# Patient Record
Sex: Female | Born: 1970
Health system: Southern US, Community
[De-identification: ages and names within clinical notes are randomized; demographics above are authoritative.]

## PROBLEM LIST (undated history)

## (undated) DIAGNOSIS — Z9889 Other specified postprocedural states: Secondary | ICD-10-CM

## (undated) DIAGNOSIS — C189 Malignant neoplasm of colon, unspecified: Secondary | ICD-10-CM

## (undated) DIAGNOSIS — R112 Nausea with vomiting, unspecified: Secondary | ICD-10-CM

## (undated) DIAGNOSIS — C187 Malignant neoplasm of sigmoid colon: Secondary | ICD-10-CM

## (undated) DIAGNOSIS — Z87898 Personal history of other specified conditions: Secondary | ICD-10-CM

## (undated) DIAGNOSIS — K6289 Other specified diseases of anus and rectum: Secondary | ICD-10-CM

## (undated) DIAGNOSIS — Z9221 Personal history of antineoplastic chemotherapy: Secondary | ICD-10-CM

## (undated) DIAGNOSIS — C772 Secondary and unspecified malignant neoplasm of intra-abdominal lymph nodes: Principal | ICD-10-CM

## (undated) HISTORY — DX: Malignant neoplasm of sigmoid colon: C18.7

## (undated) HISTORY — DX: Malignant neoplasm of colon, unspecified: C18.9

## (undated) HISTORY — PX: TONSILLECTOMY: SUR1361

## (undated) HISTORY — PX: OTHER SURGICAL HISTORY: SHX169

## (undated) HISTORY — PX: OSTEOTOMY: SHX137

## (undated) HISTORY — DX: Secondary and unspecified malignant neoplasm of intra-abdominal lymph nodes: C77.2

---

## 2009-12-23 ENCOUNTER — Ambulatory Visit: Payer: Self-pay | Admitting: Family Medicine

## 2009-12-23 DIAGNOSIS — J069 Acute upper respiratory infection, unspecified: Secondary | ICD-10-CM | POA: Insufficient documentation

## 2009-12-23 DIAGNOSIS — J029 Acute pharyngitis, unspecified: Secondary | ICD-10-CM | POA: Insufficient documentation

## 2010-04-24 NOTE — Assessment & Plan Note (Signed)
Summary: R ear pain, sorethroat x 1 dy rm 5   Vital Signs:  Patient Profile:   40 Years Old Female CC:      R ear pain, sorethroat x 1 dy Height:     65 inches Weight:      167 pounds O2 Sat:      100 % O2 treatment:    Room Air Temp:     97.6 degrees F oral Pulse rate:   72 / minute Pulse rhythm:   regular Resp:     16 per minute BP sitting:   127 / 80  (left arm) Cuff size:   regular  Vitals Entered By: Areta Haber CMA (December 23, 2009 4:56 PM)                  Current Allergies (reviewed today): ! PENICILLIN ! DAYPRO ! SULFA     History of Present Illness Chief Complaint: R ear pain, sorethroat x 1 dy History of Present Illness:  Subjective: Patient complains of sore throat that started last night.  Her daughter has a documented strep infection. No cough No pleuritic pain No wheezing + mild nasal congestion + post-nasal drainage No sinus pain/pressure No itchy/red eyes ? right earache No hemoptysis No SOB No fever/chills No nausea No vomiting No abdominal pain No diarrhea No skin rashes + fatigue + myalgias No headache Used OTC meds without relief   Current Problems: VIRAL URI (ICD-465.9) ACUTE PHARYNGITIS (ICD-462)   Current Meds FLONASE 50 MCG/ACT SUSP (FLUTICASONE PROPIONATE) as directed ALLEGRA ALLERGY 180 MG TABS (FEXOFENADINE HCL) as directed AZITHROMYCIN 250 MG TABS (AZITHROMYCIN) Two tabs by mouth on day 1, then 1 tab daily on days 2 through 5  REVIEW OF SYSTEMS Constitutional Symptoms      Denies fever, chills, night sweats, weight loss, weight gain, and fatigue.  Eyes       Denies change in vision, eye pain, eye discharge, glasses, contact lenses, and eye surgery. Ear/Nose/Throat/Mouth       Complains of ear pain and sore throat.      Denies hearing loss/aids, change in hearing, ear discharge, dizziness, frequent runny nose, frequent nose bleeds, sinus problems, hoarseness, and tooth pain or bleeding.      Comments: R x 1  dy Respiratory       Denies dry cough, productive cough, wheezing, shortness of breath, asthma, bronchitis, and emphysema/COPD.  Cardiovascular       Denies murmurs, chest pain, and tires easily with exhertion.    Gastrointestinal       Denies stomach pain, nausea/vomiting, diarrhea, constipation, blood in bowel movements, and indigestion. Genitourniary       Denies painful urination, kidney stones, and loss of urinary control. Neurological       Denies paralysis, seizures, and fainting/blackouts. Musculoskeletal       Denies muscle pain, joint pain, joint stiffness, decreased range of motion, redness, swelling, muscle weakness, and gout.  Skin       Denies bruising, unusual mles/lumps or sores, and hair/skin or nail changes.  Psych       Denies mood changes, temper/anger issues, anxiety/stress, speech problems, depression, and sleep problems.  Past History:  Past Medical History: Unremarkable  Past Surgical History: Caesarean section Ankle surgery neuropathy - part of nerve removed  Family History: Family History High cholesterol Family History Hypertension  Social History: Married Never Smoked Alcohol use-yes 3-4 weekly Drug use-no Regular exercise-yes Does Patient Exercise:  yes Smoking Status:  never Drug Use:  no   Objective:  Appearance:  Patient appears healthy, stated age, and in no acute distress  Eyes:  Pupils are equal, round, and reactive to light and accomdation.  Extraocular movement is intact.  Conjunctivae are not inflamed.  Ears:  Canals normal.  Tympanic membranes normal.   Nose:  Normal septum.  Normal turbinates, mildly congested.   No sinus tenderness present. Pharynx:  Mildly erythematous Neck:  Supple.  Slightly tender shotty anterior/posterior nodes are palpated bilaterally.  Lungs:  Clear to auscultation.  Breath sounds are equal.  Heart:  Regular rate and rhythm without murmurs, rubs, or gallops.  Abdomen:  Nontender without masses or  hepatosplenomegaly.  Bowel sounds are present.  No CVA or flank tenderness.  Rapid strep test negative  Assessment New Problems: VIRAL URI (ICD-465.9) ACUTE PHARYNGITIS (ICD-462)  SUSPECT VIRAL URI PLUS STREP PHARYNGITIS DESPITE NEGATIVE STREP TEST  Plan New Medications/Changes: AZITHROMYCIN 250 MG TABS (AZITHROMYCIN) Two tabs by mouth on day 1, then 1 tab daily on days 2 through 5  #6 tabs x 0, 12/23/2009, Donna Christen MD  New Orders: Rapid Strep [16109] New Patient Level III [99203] Planning Comments:   Treat empirically for strep with Z-pack.  Add expectorant/decongestant, cough suppressant at night, Ibuprofen for sore throat. Follow-up with PCP if not improving.   The patient and/or caregiver has been counseled thoroughly with regard to medications prescribed including dosage, schedule, interactions, rationale for use, and possible side effects and they verbalize understanding.  Diagnoses and expected course of recovery discussed and will return if not improved as expected or if the condition worsens. Patient and/or caregiver verbalized understanding.  Prescriptions: AZITHROMYCIN 250 MG TABS (AZITHROMYCIN) Two tabs by mouth on day 1, then 1 tab daily on days 2 through 5  #6 tabs x 0   Entered and Authorized by:   Donna Christen MD   Signed by:   Donna Christen MD on 12/23/2009   Method used:   Print then Give to Patient   RxID:   [id number]   Patient Instructions: 1)  May use Mucinex D (guaifenesin with decongestant) twice daily for congestion. 2)  Increase fluid intake, rest. 3)  May take Ibuprofen 200mg , 4 tabs every 8 hours with food for sore throat. 4)  May take Delsym cough suppressant at bedtime for cough 5)  May use Afrin nasal spray (or generic oxymetazoline) twice daily for about 5 days.  Also recommend using saline nasal spray several times daily and/or saline nasal irrigation. 6)  Followup with family doctor if not improving 7 to 10 days.   Orders Added: 1)   Rapid Strep [95621] 2)  New Patient Level III [99203]  Laboratory Results  Date/Time Received: December 23, 2009 5:35 PM  Date/Time Reported: December 23, 2009 5:35 PM   Other Tests  Rapid Strep: negative  Kit Test Internal QC: Negative   (Normal Range: Negative)

## 2010-10-06 ENCOUNTER — Other Ambulatory Visit: Payer: Self-pay | Admitting: Family Medicine

## 2010-10-06 ENCOUNTER — Inpatient Hospital Stay (INDEPENDENT_AMBULATORY_CARE_PROVIDER_SITE_OTHER)
Admission: RE | Admit: 2010-10-06 | Discharge: 2010-10-06 | Disposition: A | Discharge status: Home / Self Care | Payer: BC Managed Care – PPO | Source: Ambulatory Visit | Attending: Family Medicine | Admitting: Family Medicine

## 2010-10-06 ENCOUNTER — Ambulatory Visit
Admission: RE | Admit: 2010-10-06 | Discharge: 2010-10-06 | Disposition: A | Discharge status: Home / Self Care | Payer: BC Managed Care – PPO | Source: Ambulatory Visit | Attending: Family Medicine | Admitting: Family Medicine

## 2010-10-06 ENCOUNTER — Encounter: Payer: Self-pay | Admitting: Family Medicine

## 2010-10-06 DIAGNOSIS — S8000XA Contusion of unspecified knee, initial encounter: Secondary | ICD-10-CM
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
## 2012-06-26 ENCOUNTER — Encounter: Payer: Self-pay | Admitting: Emergency Medicine

## 2012-06-26 ENCOUNTER — Emergency Department
Admission: EM | Admit: 2012-06-26 | Discharge: 2012-06-26 | Disposition: A | Discharge status: Home / Self Care | Payer: BC Managed Care – PPO | Source: Home / Self Care | Attending: Family Medicine | Admitting: Family Medicine

## 2012-06-26 DIAGNOSIS — J329 Chronic sinusitis, unspecified: Secondary | ICD-10-CM

## 2012-06-26 DIAGNOSIS — J309 Allergic rhinitis, unspecified: Secondary | ICD-10-CM

## 2012-06-26 DIAGNOSIS — J029 Acute pharyngitis, unspecified: Secondary | ICD-10-CM

## 2012-06-26 MED ORDER — FLUTICASONE PROPIONATE 50 MCG/ACT NA SUSP: 2.0000 | Freq: Every day | NASAL | Status: DC

## 2012-06-26 MED ORDER — AZITHROMYCIN 250 MG PO TABS: ORAL_TABLET | ORAL | Status: DC

## 2012-06-26 MED ORDER — METHYLPREDNISOLONE ACETATE 80 MG/ML IJ SUSP
80.0000 mg | Freq: Once | INTRAMUSCULAR | Status: AC
  Administered 2012-06-26: 80 mg via INTRAMUSCULAR

## 2012-06-26 NOTE — ED Provider Notes (Signed)
History     CSN: [id number]  Arrival date & time 06/26/12  0846   First MD Initiated Contact with Patient 06/26/12 [phone]      Chief Complaint  Patient presents with  . URI   HPI  URI Symptoms Onset: 2-3 days  Description: rhinorrhea, nasal congestion, sinus drainage, mild sore throat, emesis  Modifying factors:  Hx/o allergies. Has not been taking medication consistently.   Symptoms Nasal discharge: yes Fever: no Sore throat: yes Cough: mild Wheezing: no Ear pain: no GI symptoms: no Sick contacts: no  Red Flags  Stiff neck: no Dyspnea: no Rash: no Swallowing difficulty: no  Sinusitis Risk Factors Headache/face pain: mild Double sickening: no tooth pain: no  Allergy Risk Factors Sneezing: yes Itchy scratchy throat: yes Seasonal symptoms: yes  Flu Risk Factors Headache: no muscle aches: no severe fatigue: no   History reviewed. No pertinent past medical history.  History reviewed. No pertinent past surgical history.  No family history on file.  History  Substance Use Topics  . Smoking status: Never Smoker   . Smokeless tobacco: Not on file  . Alcohol Use: Yes    OB History   Grav Para Term Preterm Abortions TAB SAB Ect Mult Living                  Review of Systems  All other systems reviewed and are negative.    Allergies  Oxaprozin; Penicillins; and Sulfonamide derivatives  Home Medications   Current Outpatient Rx  Name  Route  Sig  Dispense  Refill  . dextromethorphan-guaiFENesin (MUCINEX DM) 30-600 MG per 12 hr tablet   Oral   Take 1 tablet by mouth every 12 (twelve) hours.         . fexofenadine (ALLEGRA) 180 MG tablet   Oral   Take 180 mg by mouth daily.           BP 114/79  Pulse 79  Temp(Src) 98.1 F (36.7 C) (Oral)  Ht 5\' 5"  (1.651 m)  Wt 167 lb (75.751 kg)  BMI 27.79 kg/m2  SpO2 98%  Physical Exam  Constitutional: She appears well-developed and well-nourished.  HENT:  Head: Normocephalic and atraumatic.   Right Ear: External ear normal.  Left Ear: External ear normal.  +nasal erythema, rhinorrhea bilaterally, + post oropharyngeal erythema  Mild bilateral maxillary TTP   Eyes: Conjunctivae are normal. Pupils are equal, round, and reactive to light.  Neck: Normal range of motion. Neck supple.  Cardiovascular: Normal rate, regular rhythm and normal heart sounds.   Pulmonary/Chest: Effort normal and breath sounds normal.  Abdominal: Soft.  Musculoskeletal: Normal range of motion.  Lymphadenopathy:    She has cervical adenopathy.  Neurological: She is alert.  Skin: Skin is warm.    ED Course  Procedures (including critical care time)  Labs Reviewed - No data to display No results found.   1. Allergic rhinitis   2. Sinusitis       MDM  Rapid strep negative.  Likely concominant partially treated allergic rhinitis and sinusitis.  Depomedrol 80mg  IM x1 Zpak for sinusitis coverage.  Restart flonase and allegra.  Discussed infectious and ENT red flags.  Follow up as needed.     The patient and/or caregiver has been counseled thoroughly with regard to treatment plan and/or medications prescribed including dosage, schedule, interactions, rationale for use, and possible side effects and they verbalize understanding. Diagnoses and expected course of recovery discussed and will return if not improved as expected or  if the condition worsens. Patient and/or caregiver verbalized understanding.             Doree Albee, MD 06/26/12 [phone]

## 2012-06-26 NOTE — ED Notes (Signed)
Started with sinus drainage on Wednesday, sore throat, body aches, yesterday, vomiting this morning

## 2012-06-29 ENCOUNTER — Telehealth: Payer: Self-pay | Admitting: *Deleted

## 2013-04-06 ENCOUNTER — Other Ambulatory Visit: Payer: Self-pay

## 2013-04-06 DIAGNOSIS — Z1231 Encounter for screening mammogram for malignant neoplasm of breast: Secondary | ICD-10-CM

## 2013-04-23 ENCOUNTER — Ambulatory Visit
Admission: RE | Admit: 2013-04-23 | Discharge: 2013-04-23 | Disposition: A | Discharge status: Home / Self Care | Payer: Self-pay | Source: Ambulatory Visit

## 2013-04-23 DIAGNOSIS — Z1231 Encounter for screening mammogram for malignant neoplasm of breast: Secondary | ICD-10-CM

## 2014-04-05 ENCOUNTER — Other Ambulatory Visit: Payer: Self-pay

## 2014-04-05 DIAGNOSIS — Z1231 Encounter for screening mammogram for malignant neoplasm of breast: Secondary | ICD-10-CM

## 2014-04-26 ENCOUNTER — Ambulatory Visit
Admission: RE | Admit: 2014-04-26 | Discharge: 2014-04-26 | Disposition: A | Discharge status: Home / Self Care | Payer: BLUE CROSS/BLUE SHIELD | Source: Ambulatory Visit

## 2014-04-26 DIAGNOSIS — Z1231 Encounter for screening mammogram for malignant neoplasm of breast: Secondary | ICD-10-CM

## 2015-02-08 ENCOUNTER — Other Ambulatory Visit: Payer: Self-pay | Admitting: Physician Assistant

## 2015-02-08 ENCOUNTER — Ambulatory Visit
Admission: RE | Admit: 2015-02-08 | Discharge: 2015-02-08 | Disposition: A | Discharge status: Home / Self Care | Payer: BLUE CROSS/BLUE SHIELD | Source: Ambulatory Visit | Attending: Physician Assistant | Admitting: Physician Assistant

## 2015-02-08 DIAGNOSIS — K5909 Other constipation: Secondary | ICD-10-CM

## 2015-02-09 ENCOUNTER — Other Ambulatory Visit: Payer: Self-pay | Admitting: Physician Assistant

## 2015-02-09 DIAGNOSIS — K5900 Constipation, unspecified: Secondary | ICD-10-CM

## 2015-02-10 ENCOUNTER — Inpatient Hospital Stay (HOSPITAL_COMMUNITY)
Admission: AD | Admit: 2015-02-10 | Discharge: 2015-02-24 | DRG: 330 | Disposition: A | Discharge status: Home / Self Care | Payer: BLUE CROSS/BLUE SHIELD | Source: Ambulatory Visit | Attending: Internal Medicine | Admitting: Internal Medicine

## 2015-02-10 ENCOUNTER — Ambulatory Visit
Admission: RE | Admit: 2015-02-10 | Discharge: 2015-02-10 | Disposition: A | Discharge status: Home / Self Care | Payer: BLUE CROSS/BLUE SHIELD | Source: Ambulatory Visit | Attending: Physician Assistant | Admitting: Physician Assistant

## 2015-02-10 DIAGNOSIS — D1803 Hemangioma of intra-abdominal structures: Secondary | ICD-10-CM | POA: Diagnosis present

## 2015-02-10 DIAGNOSIS — C187 Malignant neoplasm of sigmoid colon: Secondary | ICD-10-CM | POA: Diagnosis present

## 2015-02-10 DIAGNOSIS — K5900 Constipation, unspecified: Secondary | ICD-10-CM | POA: Diagnosis present

## 2015-02-10 DIAGNOSIS — F40240 Claustrophobia: Secondary | ICD-10-CM | POA: Diagnosis present

## 2015-02-10 DIAGNOSIS — K567 Ileus, unspecified: Secondary | ICD-10-CM | POA: Diagnosis not present

## 2015-02-10 DIAGNOSIS — K5909 Other constipation: Secondary | ICD-10-CM | POA: Diagnosis present

## 2015-02-10 DIAGNOSIS — K5720 Diverticulitis of large intestine with perforation and abscess without bleeding: Secondary | ICD-10-CM | POA: Diagnosis present

## 2015-02-10 DIAGNOSIS — K7689 Other specified diseases of liver: Secondary | ICD-10-CM | POA: Diagnosis not present

## 2015-02-10 DIAGNOSIS — C19 Malignant neoplasm of rectosigmoid junction: Principal | ICD-10-CM | POA: Diagnosis present

## 2015-02-10 DIAGNOSIS — D649 Anemia, unspecified: Secondary | ICD-10-CM | POA: Diagnosis not present

## 2015-02-10 DIAGNOSIS — K769 Liver disease, unspecified: Secondary | ICD-10-CM | POA: Diagnosis present

## 2015-02-10 DIAGNOSIS — Z419 Encounter for procedure for purposes other than remedying health state, unspecified: Secondary | ICD-10-CM

## 2015-02-10 DIAGNOSIS — D509 Iron deficiency anemia, unspecified: Secondary | ICD-10-CM | POA: Diagnosis present

## 2015-02-10 DIAGNOSIS — R109 Unspecified abdominal pain: Secondary | ICD-10-CM | POA: Diagnosis present

## 2015-02-10 DIAGNOSIS — Z01818 Encounter for other preprocedural examination: Secondary | ICD-10-CM

## 2015-02-10 HISTORY — DX: Other specified diseases of anus and rectum: K62.89

## 2015-02-10 LAB — COMPREHENSIVE METABOLIC PANEL
ALT: 14 U/L (ref 14–54)
AST: 15 U/L (ref 15–41)
Albumin: 3.3 g/dL — ABNORMAL LOW (ref 3.5–5.0)
Alkaline Phosphatase: 76 U/L (ref 38–126)
Anion gap: 9 (ref 5–15)
BUN: 9 mg/dL (ref 6–20)
CHLORIDE: 104 mmol/L (ref 101–111)
CO2: 25 mmol/L (ref 22–32)
Calcium: 9 mg/dL (ref 8.9–10.3)
Creatinine, Ser: 0.65 mg/dL (ref 0.44–1.00)
Glucose, Bld: 91 mg/dL (ref 65–99)
POTASSIUM: 3.9 mmol/L (ref 3.5–5.1)
SODIUM: 138 mmol/L (ref 135–145)
Total Bilirubin: 0.3 mg/dL (ref 0.3–1.2)
Total Protein: 7.2 g/dL (ref 6.5–8.1)

## 2015-02-10 LAB — CBC WITH DIFFERENTIAL/PLATELET
Basophils Absolute: 0.1 10*3/uL (ref 0.0–0.1)
Basophils Relative: 0 %
EOS ABS: 0.6 10*3/uL (ref 0.0–0.7)
Eosinophils Relative: 4 %
HEMATOCRIT: 35.1 % — AB (ref 36.0–46.0)
HEMOGLOBIN: 11.1 g/dL — AB (ref 12.0–15.0)
LYMPHS ABS: 2.2 10*3/uL (ref 0.7–4.0)
LYMPHS PCT: 14 %
MCH: 24.7 pg — AB (ref 26.0–34.0)
MCHC: 31.6 g/dL (ref 30.0–36.0)
MCV: 78 fL (ref 78.0–100.0)
MONOS PCT: 5 %
Monocytes Absolute: 0.8 10*3/uL (ref 0.1–1.0)
NEUTROS ABS: 12.3 10*3/uL — AB (ref 1.7–7.7)
NEUTROS PCT: 77 %
Platelets: 447 10*3/uL — ABNORMAL HIGH (ref 150–400)
RBC: 4.5 MIL/uL (ref 3.87–5.11)
RDW: 15.1 % (ref 11.5–15.5)
WBC: 15.9 10*3/uL — AB (ref 4.0–10.5)

## 2015-02-10 MED ORDER — POLYETHYLENE GLYCOL 3350 17 G PO PACK
17.0000 g | PACK | Freq: Two times a day (BID) | ORAL | Status: DC
  Administered 2015-02-10 – 2015-02-11 (×3): 17 g via ORAL
  Filled 2015-02-10 (×3): qty 1

## 2015-02-10 MED ORDER — ONDANSETRON HCL 4 MG/2ML IJ SOLN
4.0000 mg | Freq: Four times a day (QID) | INTRAMUSCULAR | Status: DC | PRN
  Administered 2015-02-13 – 2015-02-19 (×4): 4 mg via INTRAVENOUS
  Filled 2015-02-10 (×5): qty 2

## 2015-02-10 MED ORDER — SODIUM CHLORIDE 0.9 % IV SOLN
250.0000 mg | Freq: Once | INTRAVENOUS | Status: AC
  Administered 2015-02-10: 250 mg via INTRAVENOUS
  Filled 2015-02-10 (×3): qty 5

## 2015-02-10 MED ORDER — MORPHINE SULFATE (PF) 2 MG/ML IV SOLN
1.0000 mg | INTRAVENOUS | Status: DC | PRN
  Administered 2015-02-16: 2 mg via INTRAVENOUS
  Filled 2015-02-10: qty 1

## 2015-02-10 MED ORDER — SODIUM CHLORIDE 0.9 % IV SOLN
25.0000 mg | Freq: Once | INTRAVENOUS | Status: AC
  Administered 2015-02-10: 25 mg via INTRAVENOUS
  Filled 2015-02-10: qty 0.5

## 2015-02-10 MED ORDER — CIPROFLOXACIN IN D5W 400 MG/200ML IV SOLN
400.0000 mg | Freq: Two times a day (BID) | INTRAVENOUS | Status: DC
  Administered 2015-02-10 – 2015-02-24 (×28): 400 mg via INTRAVENOUS
  Filled 2015-02-10 (×30): qty 200

## 2015-02-10 MED ORDER — DIPHENHYDRAMINE HCL 50 MG/ML IJ SOLN: 25.0000 mg | Freq: Four times a day (QID) | INTRAMUSCULAR | Status: DC | PRN

## 2015-02-10 MED ORDER — ACETAMINOPHEN 650 MG RE SUPP: 650.0000 mg | Freq: Four times a day (QID) | RECTAL | Status: DC | PRN

## 2015-02-10 MED ORDER — SODIUM CHLORIDE 0.9 % IV SOLN
INTRAVENOUS | Status: AC
Start: 2015-02-10 — End: 2015-02-10
  Administered 2015-02-10: 18:00:00 via INTRAVENOUS

## 2015-02-10 MED ORDER — ONDANSETRON HCL 4 MG PO TABS
4.0000 mg | ORAL_TABLET | Freq: Four times a day (QID) | ORAL | Status: DC | PRN
  Administered 2015-02-11: 4 mg via ORAL
  Filled 2015-02-10 (×2): qty 1

## 2015-02-10 MED ORDER — METRONIDAZOLE IN NACL 5-0.79 MG/ML-% IV SOLN
500.0000 mg | Freq: Three times a day (TID) | INTRAVENOUS | Status: DC
  Administered 2015-02-10 – 2015-02-19 (×27): 500 mg via INTRAVENOUS
  Filled 2015-02-10 (×28): qty 100

## 2015-02-10 MED ORDER — HEPARIN SODIUM (PORCINE) 5000 UNIT/ML IJ SOLN
5000.0000 [IU] | Freq: Three times a day (TID) | INTRAMUSCULAR | Status: DC
  Administered 2015-02-10 – 2015-02-19 (×16): 5000 [IU] via SUBCUTANEOUS
  Filled 2015-02-10 (×19): qty 1

## 2015-02-10 MED ORDER — SODIUM CHLORIDE 0.9 % IV SOLN
INTRAVENOUS | Status: DC
Start: 2015-02-10 — End: 2015-02-19
  Administered 2015-02-11 – 2015-02-13 (×3): via INTRAVENOUS
  Administered 2015-02-15: 500 mL via INTRAVENOUS
  Administered 2015-02-18: 1 mL via INTRAVENOUS
  Administered 2015-02-18: 01:00:00 via INTRAVENOUS

## 2015-02-10 MED ORDER — IOPAMIDOL (ISOVUE-300) INJECTION 61%
100.0000 mL | Freq: Once | INTRAVENOUS | Status: AC | PRN
  Administered 2015-02-10: 100 mL via INTRAVENOUS

## 2015-02-10 MED ORDER — ACETAMINOPHEN 325 MG PO TABS
650.0000 mg | ORAL_TABLET | Freq: Four times a day (QID) | ORAL | Status: DC | PRN
  Administered 2015-02-11 – 2015-02-19 (×4): 650 mg via ORAL
  Filled 2015-02-10 (×4): qty 2

## 2015-02-10 NOTE — Progress Notes (Signed)
I spoke with Dr. Paulita Fujita.  This patient was seen by his PA on 11/16.  This is a 44 yo female with no PMH who presented to GI with constipation.  Now has persistent abd pain.  WBC on 11/16 13.  Iron low, MCV 76, Hgb 11.5.  Heme negative.  CT scan shows abscess in right colon. Concern for early colon ca / abscess.  Will need surgical consultation.  Sent to ER for quick eval to ensure stability before going to the floor.  Imogene Burn, PA-C [facility] Pager: [phone]

## 2015-02-10 NOTE — Consult Note (Signed)
Reason for Consult:likely diverticulitis Referring Physician: Ms Patria Mane  Carla Mills is an 44 y.o. female.  HPI: 45 yof with history of constipation treated in past that has worsened in past several weeks. She has only been having bms that are like "cake icing" with laxatives. Not really doing anything without laxatives.  Doesn't really have abdominal pain. Says she has history of anemia when she tries to give blood.  No history of prior endoscopy. No fh colon cancer.  She states had subjective fever at home.  Had some nausea with medication but no emesis at all.  She was evaluated as outpt and eventually underwent a ct scan this am that shows thickening,stranding at distal sigmoid with 3 cm abscess central pelvis with some small nodes.  She has lost 40 lbs intentionally in last couple years.  No past medical history on file. cmt   No past surgical history on file.dwyer osteotomy, 3 c/s  No family history on file.no fh colon cancer  Social History:  reports that she has never smoked. She does not have any smokeless tobacco history on file. She reports that she drinks alcohol. Her drug history is not on file.  Allergies:  Allergies  Allergen Reactions  . Oxaprozin Hives    REACTION: Hives  . Penicillins Hives    REACTION: Hives  . Sulfonamide Derivatives Swelling    Massive extremity swelling     Medications: I have reviewed the patient's current medications.  Results for orders placed or performed during the hospital encounter of 02/10/15 (from the past 48 hour(s))  Comprehensive metabolic panel     Status: Abnormal   Collection Time: 02/10/15  5:03 PM  Result Value Ref Range   Sodium 138 135 - 145 mmol/L   Potassium 3.9 3.5 - 5.1 mmol/L   Chloride 104 101 - 111 mmol/L   CO2 25 22 - 32 mmol/L   Glucose, Bld 91 65 - 99 mg/dL   BUN 9 6 - 20 mg/dL   Creatinine, Ser 0.65 0.44 - 1.00 mg/dL   Calcium 9.0 8.9 - 10.3 mg/dL   Total Protein 7.2 6.5 - 8.1 g/dL   Albumin 3.3  (L) 3.5 - 5.0 g/dL   AST 15 15 - 41 U/L   ALT 14 14 - 54 U/L   Alkaline Phosphatase 76 38 - 126 U/L   Total Bilirubin 0.3 0.3 - 1.2 mg/dL   GFR calc non Af Amer >60 >60 mL/min   GFR calc Af Amer >60 >60 mL/min    Comment: (NOTE) The eGFR has been calculated using the CKD EPI equation. This calculation has not been validated in all clinical situations. eGFR's persistently <60 mL/min signify possible Chronic Kidney Disease.    Anion gap 9 5 - 15  CBC with Differential/Platelet     Status: Abnormal   Collection Time: 02/10/15  5:03 PM  Result Value Ref Range   WBC 15.9 (H) 4.0 - 10.5 K/uL   RBC 4.50 3.87 - 5.11 MIL/uL   Hemoglobin 11.1 (L) 12.0 - 15.0 g/dL   HCT 35.1 (L) 36.0 - 46.0 %   MCV 78.0 78.0 - 100.0 fL   MCH 24.7 (L) 26.0 - 34.0 pg   MCHC 31.6 30.0 - 36.0 g/dL   RDW 15.1 11.5 - 15.5 %   Platelets 447 (H) 150 - 400 K/uL   Neutrophils Relative % 77 %   Neutro Abs 12.3 (H) 1.7 - 7.7 K/uL   Lymphocytes Relative 14 %   Lymphs Abs  2.2 0.7 - 4.0 K/uL   Monocytes Relative 5 %   Monocytes Absolute 0.8 0.1 - 1.0 K/uL   Eosinophils Relative 4 %   Eosinophils Absolute 0.6 0.0 - 0.7 K/uL   Basophils Relative 0 %   Basophils Absolute 0.1 0.0 - 0.1 K/uL    Ct Abdomen Pelvis W Contrast  02/10/2015  ADDENDUM REPORT: 02/10/2015 11:46 ADDENDUM: Note that in this setting, the small low densities in the liver could possibly be early foci of inflammation or metastatic disease. They are nonspecific however and could be small cysts or hemangiomas. These results were called by telephone at the time of interpretation on 02/10/2015 at 11:30 am to [facility] , who verbally acknowledged these results. Electronically Signed   By: Nelson Chimes M.D.   On: 02/10/2015 11:46  02/10/2015  CLINICAL DATA:  Lower abdominal and pelvic pain with constipation for several weeks. EXAM: CT ABDOMEN AND PELVIS WITH CONTRAST TECHNIQUE: Multidetector CT imaging of the abdomen and pelvis was performed using the  standard protocol following bolus administration of intravenous contrast. CONTRAST:  150m ISOVUE-300 IOPAMIDOL (ISOVUE-300) INJECTION 61% COMPARISON:  Radiography 02/08/2015 FINDINGS: Lung bases are clear. No pleural or pericardial fluid. There are numerous sub cm low densities throughout the liver, probably 6 or 7 in number. These are too small to characterize by CT. No calcified gallstones. The spleen is normal. The pancreas is normal. The adrenal glands are normal. The kidneys are normal. No cyst, mass, stone or hydronephrosis. The aorta and IVC are normal. No retroperitoneal mass or lymphadenopathy. No small bowel pathology is seen. There is a large amount of fecal matter throughout the colon consistent with constipation. The colon appears intrinsically normal until the rectosigmoid region where there appears to be wall thickening and pericolic edema. There appear to be small lymph nodes in the adjacent fat. The appearance is nonspecific by CT, but the possibility of a colon mass with local extension does exist. The differential diagnosis would include diverticulitis and other causes of colitis. There is probably a contained abscess in the central pelvis measuring approximately 3 cm in diameter. Uterus appears normal. No ovarian lesion is seen. Despite this extensive pelvic pathology, there does not appear to be any free fluid in the pelvis. No significant bone finding. IMPRESSION: Advanced pathology at the rectosigmoid junction region with marked regional stranding and edema with what appears to be a contained 3 cm abscess in the central pelvis. There are small regional lymph nodes. Differential diagnosis is that of diverticulitis with perforation and abscess, other causes of colitis with micro perforation an abscess, and colon cancer with perforation and abscess. Electronically Signed: By: MNelson ChimesM.D. On: 02/10/2015 11:22    Review of Systems  Constitutional: Positive for fever and weight loss.   Respiratory: Negative for shortness of breath.   Cardiovascular: Negative for chest pain.  Gastrointestinal: Positive for nausea, abdominal pain and constipation. Negative for vomiting and blood in stool.  Genitourinary: Negative for dysuria and urgency.   Blood pressure 137/86, pulse 89, temperature 98.8 F (37.1 C), temperature source Oral, resp. rate 16, height '5\' 5"'  (1.651 m), weight 61.236 kg (135 lb), SpO2 100 %. Physical Exam  Vitals reviewed. Constitutional: She is oriented to person, place, and time. She appears well-developed and well-nourished.  Eyes: No scleral icterus.  Cardiovascular: Normal rate, regular rhythm and normal heart sounds.   Respiratory: Effort normal and breath sounds normal. She has no wheezes. She has no rales.  GI: Soft. There is no  tenderness.  Neurological: She is alert and oriented to person, place, and time.  Skin: Skin is warm and dry.    Assessment/Plan: Likely diverticulitis  I think reasonable to treat for diverticulitis. She has no indication for surgery right now.  Continue abx, npo, eval by ir reasonable although not certain this can be drained.  This area certainly could be a tumor as well but less likely in her age group. If not improving with therapy or worsens will need to consider surgery.  Would also consider endoscopy if not getting better to ensure no tumor present.  Will follow with you.   Carla Mills 02/10/2015, 6:29 PM

## 2015-02-10 NOTE — Progress Notes (Signed)
Text sent to [facility], MD about arrival of patient on unit. [place], PA called back and confirmed patient was on the unit.

## 2015-02-10 NOTE — H&P (Signed)
[facility] History and Physical                                                                                    Carla Mills, is a 44 y.o. female  MRN: [id number]   DOB - 1971/02/27  Admit Date - 02/10/2015  Outpatient Primary MD for the patient is No PCP Per Patient  Referring Physician:  Dr. Stacie Glaze GI  Chief Complaint:  Abnormal CT -colonic abscess   HPI  Carla Mills  is a 44 y.o. female, with no past medical history who has suffered with constipation for approximately one year. In the past month it has become worse and she has developed persistent abdominal pain. She was evaluated by Deliah Goody, PA-C at [facility] who ordered a CT scan.  Imaging results showed marked edema and stranding in the rectosigmoid junction with a 3 cm abscess in the central pelvis. There were also numerous subcentimeter low densities throughout the liver.  On 02/08/2015 in the gastroenterology office the patient had a white count of 13.4 hemoglobin 11.7 with an MCV value of 76.9. LFTs were normal. Serum iron was 14 and iron saturation was 4.  The patient reports that in past month she has been seen by an internist and was put on Linzess, but it caused severe nausea, abdominal pain and bloating and she discontinued it.  She denies vomiting but has been nauseated. She denies any melena or hematochezia. She states her energy level has been about normal. She has lost 40 pounds in the past year, but has been on Weight Watchers for 2 years. 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  She is on no medications at home. She does not smoke. There is no history of colon cancer in her family. Her brother has Crohn's disease. Her father has had colon polyps.  Review of Systems  Constitutional: Negative.   HENT: Negative.   Eyes: Negative.   Respiratory: Negative.   Cardiovascular: Negative.   Gastrointestinal: Positive for nausea, abdominal pain and constipation.  Negative for vomiting, diarrhea, blood in stool and melena.  Genitourinary: Negative.   Musculoskeletal: Negative.   Skin: Negative.   Neurological: Negative.   Endo/Heme/Allergies: Negative.   Psychiatric/Behavioral: Negative.       Past Medical History  No past medical history on file.  No past surgical history on file.    Social History Social History  Substance Use Topics  . Smoking status: Never Smoker   . Smokeless tobacco: Not on file  . Alcohol Use: Yes   works part time as an Optometrist. Has 3 daughters. Married. Completely independent with ADLs.  Family History Father has colon polyps. Brother has Crohns.  Prior to Admission medications   Medication Sig Start Date End Date Taking? Authorizing Provider  azithromycin (ZITHROMAX) 250 MG tablet Take 2 tabs PO x 1 dose, then 1 tab PO QD x 4 days 06/26/12   Deneise Lever, MD  dextromethorphan-guaiFENesin [facility] DM) 30-600 MG per 12 hr tablet Take 1 tablet by mouth every 12 (twelve) hours.    Historical Provider, MD  fexofenadine (ALLEGRA) 180 MG tablet Take 180 mg by  mouth daily.    Historical Provider, MD  fluticasone (FLONASE) 50 MCG/ACT nasal spray Place 2 sprays into the nose daily. 06/26/12   Deneise Lever, MD  LINZESS 145 MCG CAPS capsule Take 145 mcg by mouth daily. 01/31/15   Historical Provider, MD    Allergies  Allergen Reactions  . Oxaprozin Hives    REACTION: Hives  . Penicillins Hives    REACTION: Hives  . Sulfonamide Derivatives Swelling    Massive extremity swelling     Physical Exam  Vitals  Blood pressure 137/86, pulse 89, temperature 98.8 F (37.1 C), temperature source Oral, resp. rate 16, height 5\' 5"  (1.651 m), weight 61.236 kg (135 lb), SpO2 100 %.   General:  Well-developed, well-nourished, healthy appearing, pleasant, lying in bed in NAD, family bedside  Psych:  Normal affect and insight, Not Suicidal or Homicidal, Awake Alert, Oriented X 3.  Neuro:   No F.N deficits, ALL  C.Nerves Intact, Strength 5/5 all 4 extremities, Sensation intact all 4 extremities.  ENT:  Ears and Eyes appear Normal, Conjunctivae clear, PER. Moist oral mucosa without erythema or exudates.  Neck:  Supple, No lymphadenopathy appreciated  Respiratory:  Symmetrical chest wall movement, Good air movement bilaterally, CTAB.  Cardiac:  RRR, No Murmurs, no LE edema noted, no JVD.    Abdomen:  Active bowel sounds, Soft, mildly tender to palpation in the right lower quadrant tender, Non distended,  No masses appreciated  Skin:  No Cyanosis, Normal Skin Turgor, No Skin Rash or Bruise.  Extremities:  Able to move all 4. 5/5 strength in each,  no effusions.  Data Review  Wt Readings from Last 3 Encounters:  02/10/15 61.236 kg (135 lb)  06/26/12 75.751 kg (167 lb)  10/06/10 71.578 kg (157 lb 12.8 oz)    Laboratory data: Pending    Imaging results:   Ct Abdomen Pelvis W Contrast  02/10/2015  ADDENDUM REPORT: 02/10/2015 11:46 ADDENDUM: Note that in this setting, the small low densities in the liver could possibly be early foci of inflammation or metastatic disease. They are nonspecific however and could be small cysts or hemangiomas. These results were called by telephone at the time of interpretation on 02/10/2015 at 11:30 am to [facility] , who verbally acknowledged these results. Electronically Signed   By: Nelson Chimes M.D.   On: 02/10/2015 11:46  02/10/2015  CLINICAL DATA:  Lower abdominal and pelvic pain with constipation for several weeks. EXAM: CT ABDOMEN AND PELVIS WITH CONTRAST TECHNIQUE: Multidetector CT imaging of the abdomen and pelvis was performed using the standard protocol following bolus administration of intravenous contrast. CONTRAST:  137mL ISOVUE-300 IOPAMIDOL (ISOVUE-300) INJECTION 61% COMPARISON:  Radiography 02/08/2015 FINDINGS: Lung bases are clear. No pleural or pericardial fluid. There are numerous sub cm low densities throughout the liver, probably 6 or 7 in  number. These are too small to characterize by CT. No calcified gallstones. The spleen is normal. The pancreas is normal. The adrenal glands are normal. The kidneys are normal. No cyst, mass, stone or hydronephrosis. The aorta and IVC are normal. No retroperitoneal mass or lymphadenopathy. No small bowel pathology is seen. There is a large amount of fecal matter throughout the colon consistent with constipation. The colon appears intrinsically normal until the rectosigmoid region where there appears to be wall thickening and pericolic edema. There appear to be small lymph nodes in the adjacent fat. The appearance is nonspecific by CT, but the possibility of a colon mass with local extension does exist.  The differential diagnosis would include diverticulitis and other causes of colitis. There is probably a contained abscess in the central pelvis measuring approximately 3 cm in diameter. Uterus appears normal. No ovarian lesion is seen. Despite this extensive pelvic pathology, there does not appear to be any free fluid in the pelvis. No significant bone finding. IMPRESSION: Advanced pathology at the rectosigmoid junction region with marked regional stranding and edema with what appears to be a contained 3 cm abscess in the central pelvis. There are small regional lymph nodes. Differential diagnosis is that of diverticulitis with perforation and abscess, other causes of colitis with micro perforation an abscess, and colon cancer with perforation and abscess. Electronically Signed: By: Nelson Chimes M.D. On: 02/10/2015 11:22   Dg Abd 2 Views  02/09/2015  CLINICAL DATA:  Chronic constipation EXAM: ABDOMEN - 2 VIEW COMPARISON:  None FINDINGS: Supine and erect views of the abdomen show a moderate amount of feces throughout the colon. No bowel obstruction is seen on the erect view. No opaque calculi are noted. The bones are unremarkable. IMPRESSION: Moderate amount of feces throughout the colon. No bowel obstruction or  free air. Electronically Signed   By: Ivar Drape M.D.   On: 02/09/2015 08:15    My personal review of EKG: Pending   Assessment & Plan  Principal Problem:   Colonic diverticular abscess Active Problems:   Liver lesion   Anemia, iron deficiency   Constipation    Colonic abscess with constipation. Concern for IBD, malignancy.  Will make nothing by mouth until seen by surgery.  Need to gently cleanse her colon without causing or irritating a microperforation.  Will cover empirically with Cipro Flagyl given her elevated white count.  Will check CEA.  [facility] will follow. General surgery is being consulted. Will also consult IR to see if they are able to drain the abscess.  Iron deficiency anemia  Heme negative on exam. But likely due to GI losses. Will ask pharmacy to give IV iron with test dose first.  Liver lesions Possible hemangiomas, cysts, or worse case metastatic disease. No history of liver disease family. The patient is not on any medications.  LFTs are within normal limits.  Will check AFP. May need MR to further evaluate.     Consultants Called:   IR and General Surgery have been called.  Direct admit from [facility]  Family Communication:   Husband and mother at bedside    Code Status:    Full code  Condition:    Stable  Potential Disposition:   To home when appropriate based on workup.  Time spent in minutes : [place],  [place] on 02/10/2015 at 5:40 PM Between 7am to 7pm - Pager - [phone] After 7pm go to [email or web address] - password [email or web address] And look for the night coverage person covering me after hours

## 2015-02-11 ENCOUNTER — Encounter (HOSPITAL_COMMUNITY): Payer: Self-pay

## 2015-02-11 DIAGNOSIS — D649 Anemia, unspecified: Secondary | ICD-10-CM

## 2015-02-11 DIAGNOSIS — K7689 Other specified diseases of liver: Secondary | ICD-10-CM

## 2015-02-11 LAB — BASIC METABOLIC PANEL
ANION GAP: 10 (ref 5–15)
BUN: 6 mg/dL (ref 6–20)
CALCIUM: 8.6 mg/dL — AB (ref 8.9–10.3)
CO2: 24 mmol/L (ref 22–32)
CREATININE: 0.67 mg/dL (ref 0.44–1.00)
Chloride: 106 mmol/L (ref 101–111)
GLUCOSE: 77 mg/dL (ref 65–99)
Potassium: 3.8 mmol/L (ref 3.5–5.1)
Sodium: 140 mmol/L (ref 135–145)

## 2015-02-11 LAB — CBC
HEMATOCRIT: 34.8 % — AB (ref 36.0–46.0)
Hemoglobin: 10.8 g/dL — ABNORMAL LOW (ref 12.0–15.0)
MCH: 24.4 pg — ABNORMAL LOW (ref 26.0–34.0)
MCHC: 31 g/dL (ref 30.0–36.0)
MCV: 78.7 fL (ref 78.0–100.0)
PLATELETS: 423 10*3/uL — AB (ref 150–400)
RBC: 4.42 MIL/uL (ref 3.87–5.11)
RDW: 15.3 % (ref 11.5–15.5)
WBC: 11.2 10*3/uL — AB (ref 4.0–10.5)

## 2015-02-11 LAB — TSH: TSH: 1.91 u[IU]/mL (ref 0.350–4.500)

## 2015-02-11 MED ORDER — BACITRACIN-NEOMYCIN-POLYMYXIN 400-5-5000 EX OINT
TOPICAL_OINTMENT | CUTANEOUS | Status: AC
  Administered 2015-02-11: 18:00:00
  Filled 2015-02-11: qty 1

## 2015-02-11 NOTE — Progress Notes (Signed)
Patient ID: Carla Mills, female   DOB: 1971-02-17, 44 y.o.   MRN: [id number]    Subjective:  Feels better today. Has had a few loose bowel movements with Mira lax.  Objective: Vital signs in last 24 hours: Temp:  [98.4 F (36.9 C)-98.8 F (37.1 C)] 98.4 F (36.9 C) (11/19 0603) Pulse Rate:  [66-89] 66 (11/19 0603) Resp:  [16-18] 18 (11/19 0603) BP: (106-137)/(57-86) 118/57 mmHg (11/19 0603) SpO2:  [97 %-100 %] 97 % (11/19 0603) Weight:  [61.236 kg (135 lb)] 61.236 kg (135 lb) (11/18 1632) Last BM Date: 02/10/15  Intake/Output from previous day: 11/18 0701 - 11/19 0700 In: 0  Out: 1753 [Urine:1750; Stool:3] Intake/Output this shift:    General appearance: alert, cooperative and no distress GI: nondistended. Soft and nontender. No palpable masses.  Lab Results:   Recent Labs  02/10/15 1703 02/11/15 0335  WBC 15.9* 11.2*  HGB 11.1* 10.8*  HCT 35.1* 34.8*  PLT 447* 423*   BMET  Recent Labs  02/10/15 1703 02/11/15 0335  NA 138 140  K 3.9 3.8  CL 104 106  CO2 25 24  GLUCOSE 91 77  BUN 9 6  CREATININE 0.65 0.67  CALCIUM 9.0 8.6*     Studies/Results: Ct Abdomen Pelvis W Contrast  02/10/2015  ADDENDUM REPORT: 02/10/2015 11:46 ADDENDUM: Note that in this setting, the small low densities in the liver could possibly be early foci of inflammation or metastatic disease. They are nonspecific however and could be small cysts or hemangiomas. These results were called by telephone at the time of interpretation on 02/10/2015 at 11:30 am to [facility] , who verbally acknowledged these results. Electronically Signed   By: Nelson Chimes M.D.   On: 02/10/2015 11:46  02/10/2015  CLINICAL DATA:  Lower abdominal and pelvic pain with constipation for several weeks. EXAM: CT ABDOMEN AND PELVIS WITH CONTRAST TECHNIQUE: Multidetector CT imaging of the abdomen and pelvis was performed using the standard protocol following bolus administration of intravenous contrast. CONTRAST:  141mL  ISOVUE-300 IOPAMIDOL (ISOVUE-300) INJECTION 61% COMPARISON:  Radiography 02/08/2015 FINDINGS: Lung bases are clear. No pleural or pericardial fluid. There are numerous sub cm low densities throughout the liver, probably 6 or 7 in number. These are too small to characterize by CT. No calcified gallstones. The spleen is normal. The pancreas is normal. The adrenal glands are normal. The kidneys are normal. No cyst, mass, stone or hydronephrosis. The aorta and IVC are normal. No retroperitoneal mass or lymphadenopathy. No small bowel pathology is seen. There is a large amount of fecal matter throughout the colon consistent with constipation. The colon appears intrinsically normal until the rectosigmoid region where there appears to be wall thickening and pericolic edema. There appear to be small lymph nodes in the adjacent fat. The appearance is nonspecific by CT, but the possibility of a colon mass with local extension does exist. The differential diagnosis would include diverticulitis and other causes of colitis. There is probably a contained abscess in the central pelvis measuring approximately 3 cm in diameter. Uterus appears normal. No ovarian lesion is seen. Despite this extensive pelvic pathology, there does not appear to be any free fluid in the pelvis. No significant bone finding. IMPRESSION: Advanced pathology at the rectosigmoid junction region with marked regional stranding and edema with what appears to be a contained 3 cm abscess in the central pelvis. There are small regional lymph nodes. Differential diagnosis is that of diverticulitis with perforation and abscess, other causes of colitis with  micro perforation an abscess, and colon cancer with perforation and abscess. Electronically Signed: By: Nelson Chimes M.D. On: 02/10/2015 11:22    Anti-infectives: Anti-infectives    Start     Dose/Rate Route Frequency Ordered Stop   02/10/15 1800  ciprofloxacin (CIPRO) IVPB 400 mg     400 mg 200 mL/hr over  60 Minutes Intravenous Every 12 hours 02/10/15 1720     02/10/15 1800  metroNIDAZOLE (FLAGYL) IVPB 500 mg     500 mg 100 mL/hr over 60 Minutes Intravenous Every 8 hours 02/10/15 1720        Assessment/Plan: Partial obstruction rectosigmoid with pericolonic abscess, possible diverticulitis but cannot rule out neoplasm. Several low density areas in the liver of questionable significance. CEA pending. She is stable to improved today on antibiotics. Start clear liquid diet. Would continue gentle bowel cleansing with Mira lax and clear liquid diet and I think would benefit from flexible sigmoidoscopy this hospitalization to confirm the diagnosis. Interventional radiology to see but likely abscess is not amenable to percutaneous drainage.    LOS: 1 day    [name] T 02/11/2015

## 2015-02-11 NOTE — Consult Note (Signed)
[facility] Consult Note  Referring Provider: No ref. provider found Primary Care Physician:  No PCP Per Patient Primary Gastroenterologist:  Dr.  Laurel Dimmer Complaint: abdominal pressure and constipation HPI: Carla Mills is an 44 y.o. white  female  He presented withworsening abdominal pain and constipation over the last few weeks. She was treated with Mira lax and did not improve. She underwent CT scan which showed thickening of the distal sigmoid colon with possible 3 centimeter abscess with small surrounding lymph nodes, differential diagnoses considered to be diverticulitis versus less likely neoplasm. She feels okay today and would like symptoms drink. She is having small liquid bowel movements on Mira lax  No past medical history on file.  No past surgical history on file.  Medications Prior to Admission  Medication Sig Dispense Refill  . fluticasone (FLONASE) 50 MCG/ACT nasal spray Place 2 sprays into the nose daily. 16 g 12  . ibuprofen (ADVIL,MOTRIN) 200 MG tablet Take 400 mg by mouth every 6 (six) hours as needed for headache or mild pain.    . polyethylene glycol (MIRALAX / GLYCOLAX) packet Take 17 g by mouth daily as needed for mild constipation.    . Pseudoephedrine HCl (PSEUDO PO) Take 2 tablets by mouth daily as needed (congestion).    . Wheat Dextrin (BENEFIBER PO) Take 1 packet by mouth daily as needed (constipation).    . fexofenadine (ALLEGRA) 180 MG tablet Take 180 mg by mouth daily as needed for allergies.       Allergies:  Allergies  Allergen Reactions  . Oxaprozin Hives    REACTION: Hives  . Penicillins Hives    REACTION: Hives  . Sulfonamide Derivatives Swelling    Massive extremity swelling     No family history on file.  Social History:  reports that she has never smoked. She does not have any smokeless tobacco history on file. She reports that she drinks alcohol. Her drug history is not on file.  Review of Systems: negative except as  above   Blood pressure 118/57, pulse 66, temperature 98.4 F (36.9 C), temperature source Oral, resp. rate 18, height '5\' 5"'  (1.651 m), weight 61.236 kg (135 lb), SpO2 97 %. Head: Normocephalic, without obvious abnormality, atraumatic Neck: no adenopathy, no carotid bruit, no JVD, supple, symmetrical, trachea midline and thyroid not enlarged, symmetric, no tenderness/mass/nodules Resp: clear to auscultation bilaterally Cardio: regular rate and rhythm, S1, S2 normal, no murmur, click, rub or gallop GI: abdomen soft nondistended with normoactive bowel sounds. No hepatosplenomegaly mass or guarding Extremities: extremities normal, atraumatic, no cyanosis or edema  Results for orders placed or performed during the hospital encounter of 02/10/15 (from the past 48 hour(s))  Comprehensive metabolic panel     Status: Abnormal   Collection Time: 02/10/15  5:03 PM  Result Value Ref Range   Sodium 138 135 - 145 mmol/L   Potassium 3.9 3.5 - 5.1 mmol/L   Chloride 104 101 - 111 mmol/L   CO2 25 22 - 32 mmol/L   Glucose, Bld 91 65 - 99 mg/dL   BUN 9 6 - 20 mg/dL   Creatinine, Ser 0.65 0.44 - 1.00 mg/dL   Calcium 9.0 8.9 - 10.3 mg/dL   Total Protein 7.2 6.5 - 8.1 g/dL   Albumin 3.3 (L) 3.5 - 5.0 g/dL   AST 15 15 - 41 U/L   ALT 14 14 - 54 U/L   Alkaline Phosphatase 76 38 - 126 U/L   Total Bilirubin 0.3 0.3 - 1.2 mg/dL  GFR calc non Af Amer >60 >60 mL/min   GFR calc Af Amer >60 >60 mL/min    Comment: (NOTE) The eGFR has been calculated using the CKD EPI equation. This calculation has not been validated in all clinical situations. eGFR's persistently <60 mL/min signify possible Chronic Kidney Disease.    Anion gap 9 5 - 15  CBC with Differential/Platelet     Status: Abnormal   Collection Time: 02/10/15  5:03 PM  Result Value Ref Range   WBC 15.9 (H) 4.0 - 10.5 K/uL   RBC 4.50 3.87 - 5.11 MIL/uL   Hemoglobin 11.1 (L) 12.0 - 15.0 g/dL   HCT 35.1 (L) 36.0 - 46.0 %   MCV 78.0 78.0 - 100.0 fL    MCH 24.7 (L) 26.0 - 34.0 pg   MCHC 31.6 30.0 - 36.0 g/dL   RDW 15.1 11.5 - 15.5 %   Platelets 447 (H) 150 - 400 K/uL   Neutrophils Relative % 77 %   Neutro Abs 12.3 (H) 1.7 - 7.7 K/uL   Lymphocytes Relative 14 %   Lymphs Abs 2.2 0.7 - 4.0 K/uL   Monocytes Relative 5 %   Monocytes Absolute 0.8 0.1 - 1.0 K/uL   Eosinophils Relative 4 %   Eosinophils Absolute 0.6 0.0 - 0.7 K/uL   Basophils Relative 0 %   Basophils Absolute 0.1 0.0 - 0.1 K/uL  CBC     Status: Abnormal   Collection Time: 02/11/15  3:35 AM  Result Value Ref Range   WBC 11.2 (H) 4.0 - 10.5 K/uL   RBC 4.42 3.87 - 5.11 MIL/uL   Hemoglobin 10.8 (L) 12.0 - 15.0 g/dL   HCT 34.8 (L) 36.0 - 46.0 %   MCV 78.7 78.0 - 100.0 fL   MCH 24.4 (L) 26.0 - 34.0 pg   MCHC 31.0 30.0 - 36.0 g/dL   RDW 15.3 11.5 - 15.5 %   Platelets 423 (H) 150 - 400 K/uL  Basic metabolic panel     Status: Abnormal   Collection Time: 02/11/15  3:35 AM  Result Value Ref Range   Sodium 140 135 - 145 mmol/L   Potassium 3.8 3.5 - 5.1 mmol/L   Chloride 106 101 - 111 mmol/L   CO2 24 22 - 32 mmol/L   Glucose, Bld 77 65 - 99 mg/dL   BUN 6 6 - 20 mg/dL   Creatinine, Ser 0.67 0.44 - 1.00 mg/dL   Calcium 8.6 (L) 8.9 - 10.3 mg/dL   GFR calc non Af Amer >60 >60 mL/min   GFR calc Af Amer >60 >60 mL/min    Comment: (NOTE) The eGFR has been calculated using the CKD EPI equation. This calculation has not been validated in all clinical situations. eGFR's persistently <60 mL/min signify possible Chronic Kidney Disease.    Anion gap 10 5 - 15   Ct Abdomen Pelvis W Contrast  02/10/2015  ADDENDUM REPORT: 02/10/2015 11:46 ADDENDUM: Note that in this setting, the small low densities in the liver could possibly be early foci of inflammation or metastatic disease. They are nonspecific however and could be small cysts or hemangiomas. These results were called by telephone at the time of interpretation on 02/10/2015 at 11:30 am to [facility] , who verbally acknowledged  these results. Electronically Signed   By: Nelson Chimes M.D.   On: 02/10/2015 11:46  02/10/2015  CLINICAL DATA:  Lower abdominal and pelvic pain with constipation for several weeks. EXAM: CT ABDOMEN AND PELVIS WITH CONTRAST TECHNIQUE:  Multidetector CT imaging of the abdomen and pelvis was performed using the standard protocol following bolus administration of intravenous contrast. CONTRAST:  170m ISOVUE-300 IOPAMIDOL (ISOVUE-300) INJECTION 61% COMPARISON:  Radiography 02/08/2015 FINDINGS: Lung bases are clear. No pleural or pericardial fluid. There are numerous sub cm low densities throughout the liver, probably 6 or 7 in number. These are too small to characterize by CT. No calcified gallstones. The spleen is normal. The pancreas is normal. The adrenal glands are normal. The kidneys are normal. No cyst, mass, stone or hydronephrosis. The aorta and IVC are normal. No retroperitoneal mass or lymphadenopathy. No small bowel pathology is seen. There is a large amount of fecal matter throughout the colon consistent with constipation. The colon appears intrinsically normal until the rectosigmoid region where there appears to be wall thickening and pericolic edema. There appear to be small lymph nodes in the adjacent fat. The appearance is nonspecific by CT, but the possibility of a colon mass with local extension does exist. The differential diagnosis would include diverticulitis and other causes of colitis. There is probably a contained abscess in the central pelvis measuring approximately 3 cm in diameter. Uterus appears normal. No ovarian lesion is seen. Despite this extensive pelvic pathology, there does not appear to be any free fluid in the pelvis. No significant bone finding. IMPRESSION: Advanced pathology at the rectosigmoid junction region with marked regional stranding and edema with what appears to be a contained 3 cm abscess in the central pelvis. There are small regional lymph nodes. Differential  diagnosis is that of diverticulitis with perforation and abscess, other causes of colitis with micro perforation an abscess, and colon cancer with perforation and abscess. Electronically Signed: By: MNelson ChimesM.D. On: 02/10/2015 11:22    Assessment: Diverticulitis with diverticular abscess rule out other source of infection and less likely neoplasm. Plan:  Agree with surgeries recommendation for IV antibiotics and observation probable reassessment with CT scan at some point, possible sigmoidoscopy. We will follow with you  Jamire Shabazz C 02/11/2015, 7:28 AM  Pager [phone] no answer or after 5 PM call [phone]

## 2015-02-11 NOTE — Progress Notes (Signed)
[facility] PROGRESS NOTE  Carla Mills [id number] DOB: [birth year] DOA: 02/10/2015  PCP: No PCP Per Patient  Brief HPI: 44 year old Caucasian female with no significant past medical history except for on and off constipation for the past 1 year, presented with abdominal pain. CT scan was done which revealed marked edema and stranding in the rectosigmoid junction with a 3 cm abscess. Patient was hospitalized for further management.  Consultants: Gastroenterology, general surgery  Procedures: None yet  Antibiotics: Ciprofloxacin and Flagyl. 11/18  Subjective: Patient feels slightly better this morning. Not in as much pain as yesterday. Denies any nausea, vomiting. Passing gas from below. Did have loose movements this morning. Denies any blood in stool. Denies any known history of thyroid disease.  Objective: Vital Signs  Filed Vitals:   02/10/15 1632 02/10/15 2234 02/11/15 0603  BP: 137/86 106/61 118/57  Pulse: 89 87 66  Temp: 98.8 F (37.1 C) 98.8 F (37.1 C) 98.4 F (36.9 C)  TempSrc: Oral Oral Oral  Resp: 16 17 18   Height: 5\' 5"  (1.651 m)    Weight: 61.236 kg (135 lb)    SpO2: 100% 100% 97%    Intake/Output Summary (Last 24 hours) at 02/11/15 1027 Last data filed at 02/11/15 0605  Gross per 24 hour  Intake      0 ml  Output   1753 ml  Net  -1753 ml   Filed Weights   02/10/15 1632  Weight: 61.236 kg (135 lb)    General appearance: alert, cooperative, appears stated age and no distress Resp: clear to auscultation bilaterally Cardio: regular rate and rhythm, S1, S2 normal, no murmur, click, rub or gallop GI: Abdomen is soft. Tender in the left lower quadrant without any rebound, rigidity or guarding. No masses or organomegaly. Vague fullness appreciated in the left lower quadrant. Bowel sounds are present. Extremities: extremities normal, atraumatic, no cyanosis or edema Neurologic: Alert and oriented 3. Cranial no stroke 12 intact. Motor strength  equal bilateral upper and lower extremity. Gait is normal.  Lab Results:  Basic Metabolic Panel:  Recent Labs Lab 02/10/15 1703 02/11/15 0335  NA 138 140  K 3.9 3.8  CL 104 106  CO2 25 24  GLUCOSE 91 77  BUN 9 6  CREATININE 0.65 0.67  CALCIUM 9.0 8.6*   Liver Function Tests:  Recent Labs Lab 02/10/15 1703  AST 15  ALT 14  ALKPHOS 76  BILITOT 0.3  PROT 7.2  ALBUMIN 3.3*   CBC:  Recent Labs Lab 02/10/15 1703 02/11/15 0335  WBC 15.9* 11.2*  NEUTROABS 12.3*  --   HGB 11.1* 10.8*  HCT 35.1* 34.8*  MCV 78.0 78.7  PLT 447* 423*     Studies/Results: Ct Abdomen Pelvis W Contrast  02/10/2015  ADDENDUM REPORT: 02/10/2015 11:46 ADDENDUM: Note that in this setting, the small low densities in the liver could possibly be early foci of inflammation or metastatic disease. They are nonspecific however and could be small cysts or hemangiomas. These results were called by telephone at the time of interpretation on 02/10/2015 at 11:30 am to [facility] , who verbally acknowledged these results. Electronically Signed   By: Nelson Chimes M.D.   On: 02/10/2015 11:46  02/10/2015  CLINICAL DATA:  Lower abdominal and pelvic pain with constipation for several weeks. EXAM: CT ABDOMEN AND PELVIS WITH CONTRAST TECHNIQUE: Multidetector CT imaging of the abdomen and pelvis was performed using the standard protocol following bolus administration of intravenous contrast. CONTRAST:  1108mL ISOVUE-300 IOPAMIDOL (ISOVUE-300)  INJECTION 61% COMPARISON:  Radiography 02/08/2015 FINDINGS: Lung bases are clear. No pleural or pericardial fluid. There are numerous sub cm low densities throughout the liver, probably 6 or 7 in number. These are too small to characterize by CT. No calcified gallstones. The spleen is normal. The pancreas is normal. The adrenal glands are normal. The kidneys are normal. No cyst, mass, stone or hydronephrosis. The aorta and IVC are normal. No retroperitoneal mass or lymphadenopathy.  No small bowel pathology is seen. There is a large amount of fecal matter throughout the colon consistent with constipation. The colon appears intrinsically normal until the rectosigmoid region where there appears to be wall thickening and pericolic edema. There appear to be small lymph nodes in the adjacent fat. The appearance is nonspecific by CT, but the possibility of a colon mass with local extension does exist. The differential diagnosis would include diverticulitis and other causes of colitis. There is probably a contained abscess in the central pelvis measuring approximately 3 cm in diameter. Uterus appears normal. No ovarian lesion is seen. Despite this extensive pelvic pathology, there does not appear to be any free fluid in the pelvis. No significant bone finding. IMPRESSION: Advanced pathology at the rectosigmoid junction region with marked regional stranding and edema with what appears to be a contained 3 cm abscess in the central pelvis. There are small regional lymph nodes. Differential diagnosis is that of diverticulitis with perforation and abscess, other causes of colitis with micro perforation an abscess, and colon cancer with perforation and abscess. Electronically Signed: By: Nelson Chimes M.D. On: 02/10/2015 11:22    Medications:  Scheduled: . ciprofloxacin  400 mg Intravenous Q12H  . heparin  5,000 Units Subcutaneous 3 times per day  . metronidazole  500 mg Intravenous Q8H  . polyethylene glycol  17 g Oral BID   Continuous: . sodium chloride     [id number] **OR** acetaminophen, diphenhydrAMINE, morphine injection, ondansetron **OR** ondansetron (ZOFRAN) IV  Assessment/Plan:  Principal Problem:   Colonic diverticular abscess Active Problems:   Constipation   Anemia, iron deficiency   Liver lesion    Colonic abscess with constipation. Thought to be secondary to acute diverticulitis. Continue intravenous antibiotics. Gastroenterology and general surgery is  following. Concern was raised about malignancy, though it's unlikely considering her age. CEA is pending. Subcentimeter liver lesions also noted. Depending on her clinical progress and further workup. She may need MRI for liver as well. She mentioned on and off history of constipation for the past 1 year. Has never been tested for thyroid disease. Will check TSH. Discussed with IR this morning. Unfortunately, the location of the abscess is such that they are unable to reach it, even with CT guidance due to presence of bowel. Continue current management for now. WBC is noted to be better.  Normocytic Anemia  Heme negative on exam. Check anemia panel. It appears that the patient was given iron dextran yesterday.  Liver lesions Possible hemangiomas, cysts, or worse case metastatic disease. Patient denies any history of colon cancer in the family. No history of liver disease in the family. As discussed above she may eventually need to undergo MRI of the liver.  DVT Prophylaxis: Subcutaneous heparin    Code Status: Full code  Family Communication: Discussed with the patient and her mother  Disposition Plan: Await improvement. Continue current management. Continue to mobilize.    LOS: 1 day   [facility] Pager [phone] 02/11/2015, 10:27 AM  If 7PM-7AM, please contact night-coverage at  [email or web address], password [facility]

## 2015-02-12 LAB — IRON AND TIBC
Iron: 88 ug/dL (ref 28–170)
SATURATION RATIOS: 31 % (ref 10.4–31.8)
TIBC: 283 ug/dL (ref 250–450)
UIBC: 195 ug/dL

## 2015-02-12 LAB — BASIC METABOLIC PANEL
Anion gap: 9 (ref 5–15)
CHLORIDE: 108 mmol/L (ref 101–111)
CO2: 23 mmol/L (ref 22–32)
CREATININE: 0.63 mg/dL (ref 0.44–1.00)
Calcium: 8.7 mg/dL — ABNORMAL LOW (ref 8.9–10.3)
GFR calc Af Amer: >60 mL/min (ref >60)
GFR calc non Af Amer: >60 mL/min (ref >60)
GLUCOSE: 84 mg/dL (ref 65–99)
POTASSIUM: 4.2 mmol/L (ref 3.5–5.1)
SODIUM: 140 mmol/L (ref 135–145)

## 2015-02-12 LAB — CBC
HCT: 34 % — ABNORMAL LOW (ref 36.0–46.0)
HEMOGLOBIN: 10.8 g/dL — AB (ref 12.0–15.0)
MCH: 24.9 pg — AB (ref 26.0–34.0)
MCHC: 31.8 g/dL (ref 30.0–36.0)
MCV: 78.3 fL (ref 78.0–100.0)
Platelets: 401 10*3/uL — ABNORMAL HIGH (ref 150–400)
RBC: 4.34 MIL/uL (ref 3.87–5.11)
RDW: 15.2 % (ref 11.5–15.5)
WBC: 11.2 10*3/uL — ABNORMAL HIGH (ref 4.0–10.5)

## 2015-02-12 LAB — RETICULOCYTES
RBC.: 4.34 MIL/uL (ref 3.87–5.11)
RETIC CT PCT: 0.8 % (ref 0.4–3.1)
Retic Count, Absolute: 34.7 10*3/uL (ref 19.0–186.0)

## 2015-02-12 LAB — CEA: CEA: 2.1 ng/mL (ref 0.0–4.7)

## 2015-02-12 LAB — FOLATE: Folate: 25.5 ng/mL (ref >5.9)

## 2015-02-12 LAB — FERRITIN: FERRITIN: 163 ng/mL (ref 11–307)

## 2015-02-12 LAB — VITAMIN B12: Vitamin B-12: 1111 pg/mL — ABNORMAL HIGH (ref 180–914)

## 2015-02-12 MED ORDER — POLYETHYLENE GLYCOL 3350 17 G PO PACK
17.0000 g | PACK | Freq: Every day | ORAL | Status: DC
  Administered 2015-02-12 – 2015-02-13 (×2): 17 g via ORAL
  Filled 2015-02-12 (×2): qty 1

## 2015-02-12 NOTE — Progress Notes (Signed)
Patient ID: Carla Mills, female   DOB: Jul 10, 1970, 44 y.o.   MRN: [id number]    Subjective: Continues to feel better. Denies any pain this morning, just slight lower abdominal soreness. He has had a lot of bowel movements.  Objective: Vital signs in last 24 hours: Temp:  [97.9 F (36.6 C)-98.6 F (37 C)] 98.6 F (37 C) (11/20 0538) Pulse Rate:  [72-82] 75 (11/20 0538) Resp:  [17-18] 17 (11/19 2152) BP: (99-114)/(58-68) 99/58 mmHg (11/20 0538) SpO2:  [98 %-99 %] 99 % (11/20 0538) Last BM Date: 02/11/15  Intake/Output from previous day: 11/19 0701 - 11/20 0700 In: 1940 [P.O.:1640; IV Piggyback:300] Out: 4303 [Urine:4300; Stool:3] Intake/Output this shift:    General appearance: alert, cooperative and no distress GI: normal findings: soft, non-tender and nnondistended  Lab Results:   Recent Labs  02/11/15 0335 02/12/15 0350  WBC 11.2* 11.2*  HGB 10.8* 10.8*  HCT 34.8* 34.0*  PLT 423* 401*   BMET  Recent Labs  02/11/15 0335 02/12/15 0350  NA 140 140  K 3.8 4.2  CL 106 108  CO2 24 23  GLUCOSE 77 84  BUN 6 <5*  CREATININE 0.67 0.63  CALCIUM 8.6* 8.7*     Studies/Results: Ct Abdomen Pelvis W Contrast  02/10/2015  ADDENDUM REPORT: 02/10/2015 11:46 ADDENDUM: Note that in this setting, the small low densities in the liver could possibly be early foci of inflammation or metastatic disease. They are nonspecific however and could be small cysts or hemangiomas. These results were called by telephone at the time of interpretation on 02/10/2015 at 11:30 am to [facility] , who verbally acknowledged these results. Electronically Signed   By: Nelson Chimes M.D.   On: 02/10/2015 11:46  02/10/2015  CLINICAL DATA:  Lower abdominal and pelvic pain with constipation for several weeks. EXAM: CT ABDOMEN AND PELVIS WITH CONTRAST TECHNIQUE: Multidetector CT imaging of the abdomen and pelvis was performed using the standard protocol following bolus administration of intravenous  contrast. CONTRAST:  180mL ISOVUE-300 IOPAMIDOL (ISOVUE-300) INJECTION 61% COMPARISON:  Radiography 02/08/2015 FINDINGS: Lung bases are clear. No pleural or pericardial fluid. There are numerous sub cm low densities throughout the liver, probably 6 or 7 in number. These are too small to characterize by CT. No calcified gallstones. The spleen is normal. The pancreas is normal. The adrenal glands are normal. The kidneys are normal. No cyst, mass, stone or hydronephrosis. The aorta and IVC are normal. No retroperitoneal mass or lymphadenopathy. No small bowel pathology is seen. There is a large amount of fecal matter throughout the colon consistent with constipation. The colon appears intrinsically normal until the rectosigmoid region where there appears to be wall thickening and pericolic edema. There appear to be small lymph nodes in the adjacent fat. The appearance is nonspecific by CT, but the possibility of a colon mass with local extension does exist. The differential diagnosis would include diverticulitis and other causes of colitis. There is probably a contained abscess in the central pelvis measuring approximately 3 cm in diameter. Uterus appears normal. No ovarian lesion is seen. Despite this extensive pelvic pathology, there does not appear to be any free fluid in the pelvis. No significant bone finding. IMPRESSION: Advanced pathology at the rectosigmoid junction region with marked regional stranding and edema with what appears to be a contained 3 cm abscess in the central pelvis. There are small regional lymph nodes. Differential diagnosis is that of diverticulitis with perforation and abscess, other causes of colitis with micro perforation an  abscess, and colon cancer with perforation and abscess. Electronically Signed: By: Nelson Chimes M.D. On: 02/10/2015 11:22    Anti-infectives: Anti-infectives    Start     Dose/Rate Route Frequency Ordered Stop   02/10/15 1800  ciprofloxacin (CIPRO) IVPB 400 mg      400 mg 200 mL/hr over 60 Minutes Intravenous Every 12 hours 02/10/15 1720     02/10/15 1800  metroNIDAZOLE (FLAGYL) IVPB 500 mg     500 mg 100 mL/hr over 60 Minutes Intravenous Every 8 hours 02/10/15 1720        Assessment/Plan: Pericolonic sigmoid abscess with inflammatory change, diverticulitis versus neoplasm. Small liver lesions of uncertain significance. Overall much improved. CEA pending. I think she should have a flexible sigmoidoscopy and repeat CT scan prior to discharge. Continue current treatment.    LOS: 2 days    Leviticus Harton T 02/12/2015

## 2015-02-12 NOTE — Progress Notes (Addendum)
[facility] PROGRESS NOTE  Carla Mills [id number] DOB: 04/28/1970 DOA: 02/10/2015  PCP: No PCP Per Patient  Brief HPI: 44 year old Caucasian female with no significant past medical history except for on and off constipation for the past 1 year, presented with abdominal pain. CT scan was done which revealed marked edema and stranding in the rectosigmoid junction with a 3 cm abscess. Patient was hospitalized for further management.  Consultants: Gastroenterology, general surgery  Procedures: None yet  Antibiotics: Ciprofloxacin and Flagyl. 11/18  Subjective: Patient continues to feel better. Denies any pain in the abdomen. Has had multiple bowel movements, some of which have been loose. Denies any nausea or vomiting.   Objective: Vital Signs  Filed Vitals:   02/11/15 0603 02/11/15 1306 02/11/15 2152 02/12/15 0538  BP: 118/57 112/68 114/59 99/58  Pulse: 66 72 82 75  Temp: 98.4 F (36.9 C) 97.9 F (36.6 C) 98.4 F (36.9 C) 98.6 F (37 C)  TempSrc: Oral Oral Oral Oral  Resp: 18 18 17    Height:      Weight:      SpO2: 97% 98% 98% 99%    Intake/Output Summary (Last 24 hours) at 02/12/15 0950 Last data filed at 02/12/15 0815  Gross per 24 hour  Intake   1820 ml  Output   4953 ml  Net  -3133 ml   Filed Weights   02/10/15 1632  Weight: 61.236 kg (135 lb)    General appearance: alert, cooperative, appears stated age and no distress Resp: clear to auscultation bilaterally Cardio: regular rate and rhythm, S1, S2 normal, no murmur, click, rub or gallop GI: Abdomen is soft. Mildly Tender in the left lower quadrant without any rebound, rigidity or guarding. No masses or organomegaly. Bowel sounds are present. Extremities: extremities normal, atraumatic, no cyanosis or edema Neurologic: Alert and oriented 3. Motor strength equal bilateral upper and lower extremity. Gait is normal.  Lab Results:  Basic Metabolic Panel:  Recent Labs Lab 02/10/15 1703  02/11/15 0335 02/12/15 0350  NA 138 140 140  K 3.9 3.8 4.2  CL 104 106 108  CO2 25 24 23   GLUCOSE 91 77 84  BUN 9 6 <5*  CREATININE 0.65 0.67 0.63  CALCIUM 9.0 8.6* 8.7*   Liver Function Tests:  Recent Labs Lab 02/10/15 1703  AST 15  ALT 14  ALKPHOS 76  BILITOT 0.3  PROT 7.2  ALBUMIN 3.3*   CBC:  Recent Labs Lab 02/10/15 1703 02/11/15 0335 02/12/15 0350  WBC 15.9* 11.2* 11.2*  NEUTROABS 12.3*  --   --   HGB 11.1* 10.8* 10.8*  HCT 35.1* 34.8* 34.0*  MCV 78.0 78.7 78.3  PLT 447* 423* 401*     Studies/Results: Ct Abdomen Pelvis W Contrast  02/10/2015  ADDENDUM REPORT: 02/10/2015 11:46 ADDENDUM: Note that in this setting, the small low densities in the liver could possibly be early foci of inflammation or metastatic disease. They are nonspecific however and could be small cysts or hemangiomas. These results were called by telephone at the time of interpretation on 02/10/2015 at 11:30 am to [facility] , who verbally acknowledged these results. Electronically Signed   By: Nelson Chimes M.D.   On: 02/10/2015 11:46  02/10/2015  CLINICAL DATA:  Lower abdominal and pelvic pain with constipation for several weeks. EXAM: CT ABDOMEN AND PELVIS WITH CONTRAST TECHNIQUE: Multidetector CT imaging of the abdomen and pelvis was performed using the standard protocol following bolus administration of intravenous contrast. CONTRAST:  128mL ISOVUE-300 IOPAMIDOL (  ISOVUE-300) INJECTION 61% COMPARISON:  Radiography 02/08/2015 FINDINGS: Lung bases are clear. No pleural or pericardial fluid. There are numerous sub cm low densities throughout the liver, probably 6 or 7 in number. These are too small to characterize by CT. No calcified gallstones. The spleen is normal. The pancreas is normal. The adrenal glands are normal. The kidneys are normal. No cyst, mass, stone or hydronephrosis. The aorta and IVC are normal. No retroperitoneal mass or lymphadenopathy. No small bowel pathology is seen. There is  a large amount of fecal matter throughout the colon consistent with constipation. The colon appears intrinsically normal until the rectosigmoid region where there appears to be wall thickening and pericolic edema. There appear to be small lymph nodes in the adjacent fat. The appearance is nonspecific by CT, but the possibility of a colon mass with local extension does exist. The differential diagnosis would include diverticulitis and other causes of colitis. There is probably a contained abscess in the central pelvis measuring approximately 3 cm in diameter. Uterus appears normal. No ovarian lesion is seen. Despite this extensive pelvic pathology, there does not appear to be any free fluid in the pelvis. No significant bone finding. IMPRESSION: Advanced pathology at the rectosigmoid junction region with marked regional stranding and edema with what appears to be a contained 3 cm abscess in the central pelvis. There are small regional lymph nodes. Differential diagnosis is that of diverticulitis with perforation and abscess, other causes of colitis with micro perforation an abscess, and colon cancer with perforation and abscess. Electronically Signed: By: Nelson Chimes M.D. On: 02/10/2015 11:22    Medications:  Scheduled: . ciprofloxacin  400 mg Intravenous Q12H  . heparin  5,000 Units Subcutaneous 3 times per day  . metronidazole  500 mg Intravenous Q8H  . [START ON 02/13/2015] polyethylene glycol  17 g Oral Daily   Continuous: . sodium chloride 75 mL/hr at 02/11/15 1656   [id number] **OR** acetaminophen, diphenhydrAMINE, morphine injection, ondansetron **OR** ondansetron (ZOFRAN) IV  Assessment/Plan:  Principal Problem:   Colonic diverticular abscess Active Problems:   Constipation   Anemia, iron deficiency   Liver lesion    Colonic abscess with constipation. Thought to be secondary to acute diverticulitis. Continue intravenous antibiotics. Gastroenterology and general surgery is  following. Concern was raised about malignancy, though it's unlikely considering her age. CEA is 2.1. Subcentimeter liver lesions also noted. Depending on her clinical progress and further workup, she may need MRI for liver as well. She mentioned on and off history of constipation for the past 1 year. TSH is normal. Discussed with IR and unfortunately, the location of the abscess is such that they are unable to reach it, even with CT guidance due to presence of bowel. Continue current management for now. WBC is noted to be better. Plan is for repeat imaging study in 2-3 days along with flexible sigmoidoscopy. Regarding her loose stools, she is noted to be on high-dose Miralax. This will be adjusted.  Normocytic Anemia  Heme negative on exam. Anemia panel reviewed. No evidence of iron deficiency. Although she was given iron dextran yesterday which could have influenced the results.  Liver lesions Possible hemangiomas, cysts, or worse case metastatic disease. Patient denies any history of colon cancer in the family. No history of liver disease in the family. As discussed above she may eventually need to undergo MRI of the liver.  DVT Prophylaxis: Subcutaneous heparin    Code Status: Full code  Family Communication: Discussed with the patient and her  husband Disposition Plan: Continue current management. Continue to mobilize.    LOS: 2 days   [facility] Pager [phone] 02/12/2015, 9:50 AM  If 7PM-7AM, please contact night-coverage at [email or web address], password [facility]

## 2015-02-13 DIAGNOSIS — D509 Iron deficiency anemia, unspecified: Secondary | ICD-10-CM

## 2015-02-13 LAB — AFP TUMOR MARKER: AFP-Tumor Marker: 4.4 ng/mL (ref 0.0–8.3)

## 2015-02-13 NOTE — Progress Notes (Signed)
  Subjective: She is fine this AM, no pain, multiple BM's.  No pain at all this AM.  Objective: Vital signs in last 24 hours: Temp:  [97.6 F (36.4 C)-98.9 F (37.2 C)] 98.9 F (37.2 C) (11/21 0531) Pulse Rate:  [73-75] 73 (11/21 0531) Resp:  [17-18] 18 (11/21 0531) BP: (92-113)/(54-67) 92/60 mmHg (11/21 0531) SpO2:  [99 %-100 %] 100 % (11/21 0531) Last BM Date: 02/12/15 PO 1160  Diet: full liquids BM x 11 recorded Negative 5.6 liter I/O reported Afebrile,, VSS, BP down some BMP is normal, WBC 11.2  Stable AFP 4.4/ CEA 2.1  Both are normal CT scan 02/10/15: Rectosigmoid junction area with stranding and edema, that appears to be 3 cm abscess/possible diverticulitis or possible colon cancer with perforation   Intake/Output from previous day: 11/20 0701 - 11/21 0700 In: [id number] [P.O.:1160; I.V.:1635; IV Piggyback:1100] Out: 5450 [Urine:5450] Intake/Output this shift:    General appearance: alert, cooperative and no distress GI: soft, non-tender; bowel sounds normal; no masses,  no organomegaly  Lab Results:   Recent Labs  02/11/15 0335 02/12/15 0350  WBC 11.2* 11.2*  HGB 10.8* 10.8*  HCT 34.8* 34.0*  PLT 423* 401*    BMET  Recent Labs  02/11/15 0335 02/12/15 0350  NA 140 140  K 3.8 4.2  CL 106 108  CO2 24 23  GLUCOSE 77 84  BUN 6 <5*  CREATININE 0.67 0.63  CALCIUM 8.6* 8.7*   PT/INR No results for input(s): LABPROT, INR in the last 72 hours.   Recent Labs Lab 02/10/15 1703  AST 15  ALT 14  ALKPHOS 76  BILITOT 0.3  PROT 7.2  ALBUMIN 3.3*   Lipase  No results found for: LIPASE   Studies/Results: No results found.  Medications: . ciprofloxacin  400 mg Intravenous Q12H  . heparin  5,000 Units Subcutaneous 3 times per day  . metronidazole  500 mg Intravenous Q8H  . polyethylene glycol  17 g Oral Daily   . sodium chloride 75 mL/hr at 02/12/15 1630   Prior to Admission medications   Medication Sig Start Date End Date Taking? Authorizing  Provider  fluticasone (FLONASE) 50 MCG/ACT nasal spray Place 2 sprays into the nose daily. 06/26/12  Yes Deneise Lever, MD  ibuprofen (ADVIL,MOTRIN) 200 MG tablet Take 400 mg by mouth every 6 (six) hours as needed for headache or mild pain.   Yes Historical Provider, MD  polyethylene glycol (MIRALAX / GLYCOLAX) packet Take 17 g by mouth daily as needed for mild constipation.   Yes Historical Provider, MD  Pseudoephedrine HCl (PSEUDO PO) Take 2 tablets by mouth daily as needed (congestion).   Yes Historical Provider, MD  Wheat Dextrin (BENEFIBER PO) Take 1 packet by mouth daily as needed (constipation).   Yes Historical Provider, MD  fexofenadine (ALLEGRA) 180 MG tablet Take 180 mg by mouth daily as needed for allergies.     Historical Provider, MD     Assessment/Plan: Pericolonic sigmoid abscess/Inflammatory changes/diverticulitis vs neoplasm Acute on chronic constipation Weight loss on diet Liver lesions or uncertain significance Anemia Antibiotics:  Day 4 Cipro/Flagyl DVT:  Heparin/SCD   Plan:  As outlined by Dr. Excell Seltzer we would go ahead with the GI evaluation and then decide on repeat CT after that.  She is doing well on current antibiotics.       LOS: 3 days    Ellene Bloodsaw 02/13/2015

## 2015-02-13 NOTE — Progress Notes (Signed)
[facility] PROGRESS NOTE  Kjirsten Joffe [id number] DOB: 1970/08/18 DOA: 02/10/2015  PCP: No PCP Per Patient  Brief HPI: 44 year old Caucasian female with no significant past medical history except for on and off constipation for the past 1 year, presented with abdominal pain. CT scan was done which revealed marked edema and stranding in the rectosigmoid junction with a 3 cm abscess. Patient was hospitalized for further management.  Consultants: Gastroenterology, general surgery  Procedures: None yet  Antibiotics: Ciprofloxacin and Flagyl 11/18--  Subjective: Patient denies any complaints. Not having as many bowel movements as she was yesterday. Denies any nausea or vomiting. Multiple questions about her procedure. These were answered to the best of my abilities.  Objective: Vital Signs  Filed Vitals:   02/12/15 0538 02/12/15 1405 02/12/15 2200 02/13/15 0531  BP: 99/58 113/54 105/67 92/60  Pulse: 75 73 75 73  Temp: 98.6 F (37 C) 97.6 F (36.4 C) 98.7 F (37.1 C) 98.9 F (37.2 C)  TempSrc: Oral Oral Oral Oral  Resp:  18 17 18   Height:      Weight:      SpO2: 99% 100% 99% 100%    Intake/Output Summary (Last 24 hours) at 02/13/15 [id number] Last data filed at 02/13/15 0533  Gross per 24 hour  Intake   3535 ml  Output   4650 ml  Net  -1115 ml   Filed Weights   02/10/15 1632  Weight: 61.236 kg (135 lb)    General appearance: alert, cooperative, appears stated age and no distress Resp: clear to auscultation bilaterally Cardio: regular rate and rhythm, S1, S2 normal, no murmur, click, rub or gallop GI: Abdomen is soft. Nontender today. No masses or organomegaly. Bowel sounds are present. Extremities: extremities normal, atraumatic, no cyanosis or edema Neurologic: Alert and oriented 3. Motor strength equal bilateral upper and lower extremity. Gait is normal.  Lab Results:  Basic Metabolic Panel:  Recent Labs Lab 02/10/15 1703 02/11/15 0335  02/12/15 0350  NA 138 140 140  K 3.9 3.8 4.2  CL 104 106 108  CO2 25 24 23   GLUCOSE 91 77 84  BUN 9 6 <5*  CREATININE 0.65 0.67 0.63  CALCIUM 9.0 8.6* 8.7*   Liver Function Tests:  Recent Labs Lab 02/10/15 1703  AST 15  ALT 14  ALKPHOS 76  BILITOT 0.3  PROT 7.2  ALBUMIN 3.3*   CBC:  Recent Labs Lab 02/10/15 1703 02/11/15 0335 02/12/15 0350  WBC 15.9* 11.2* 11.2*  NEUTROABS 12.3*  --   --   HGB 11.1* 10.8* 10.8*  HCT 35.1* 34.8* 34.0*  MCV 78.0 78.7 78.3  PLT 447* 423* 401*     Studies/Results: No results found.  Medications:  Scheduled: . ciprofloxacin  400 mg Intravenous Q12H  . heparin  5,000 Units Subcutaneous 3 times per day  . metronidazole  500 mg Intravenous Q8H  . polyethylene glycol  17 g Oral Daily   Continuous: . sodium chloride 75 mL/hr at 02/12/15 1630   [id number] **OR** acetaminophen, morphine injection, ondansetron **OR** ondansetron (ZOFRAN) IV  Assessment/Plan:  Principal Problem:   Colonic diverticular abscess Active Problems:   Constipation   Anemia, iron deficiency   Liver lesion    Colonic abscess with constipation. Thought to be secondary to acute diverticulitis. Continue intravenous antibiotics. Gastroenterology and general surgery is following. Concern was raised about malignancy, though it's unlikely considering her age. CEA is 2.1. Subcentimeter liver lesions also noted. Depending on her clinical progress and further workup, she  may need MRI for liver as well. She mentioned on and off history of constipation for the past 1 year. TSH is normal. Discussed with IR and unfortunately, the location of the abscess is such that they are unable to reach it, even with CT guidance due to presence of bowel. Continue current management for now. WBC is noted to be better. Plan is for Fixable sigmoidoscopy on Wednesday to rule out a mass. If there is no mass she will undergo CT scan to evaluate the abscess.Frequency of loose stools  has decreased. Continue once a day Miralax.   Normocytic Anemia  Heme negative on exam. Anemia panel reviewed. No evidence of iron deficiency. Although she was given iron dextran 11/19 which could have influenced the results.  Liver lesions Possible hemangiomas, cysts, or worse case metastatic disease. Patient denies any history of colon cancer in the family. No history of liver disease in the family. As discussed above she may eventually need to undergo MRI of the liver. Consider ordering this tomorrow.   DVT Prophylaxis: Subcutaneous heparin    Code Status: Full code  Family Communication: Discussed in detail with the patient and her husband Disposition Plan: Continue current management. Continue to mobilize.    LOS: 3 days   [facility] Pager [phone] 02/13/2015, 9:21 AM  If 7PM-7AM, please contact night-coverage at [email or web address], password [facility]

## 2015-02-13 NOTE — Progress Notes (Signed)
[facility] Progress Note  Subjective: The patient states that she feels fine today. She has no complaints of abdominal pain. She is eating. She is having bowel movements.  Objective: Vital signs in last 24 hours: Temp:  [97.6 F (36.4 C)-98.9 F (37.2 C)] 98.9 F (37.2 C) (11/21 0531) Pulse Rate:  [73-75] 73 (11/21 0531) Resp:  [17-18] 18 (11/21 0531) BP: (92-113)/(54-67) 92/60 mmHg (11/21 0531) SpO2:  [99 %-100 %] 100 % (11/21 0531) Weight change:    PE:  No distress  Heart regular rhythm  Lungs clear  Abdomen: Bowel sounds present, soft, minimal discomfort left lower quadrant  Lab Results: No results found for this or any previous visit (from the past 24 hour(s)).  Studies/Results: No results found.    Assessment: Diverticulitis with contained small abscess versus malignancy in sigmoid with perforation  Plan:  Continue antibiotics. We will plan flexible sigmoidoscopy on Wednesday.    Cassell Clement 02/13/2015, 9:34 AM  Pager: [phone] If no answer or after 5 PM call [phone]

## 2015-02-13 NOTE — Progress Notes (Signed)
Pt a/o, no c/o pain, pt in IVF NS @ 75 ml/hr, VSS, pt stable

## 2015-02-14 ENCOUNTER — Inpatient Hospital Stay (HOSPITAL_COMMUNITY): Payer: BLUE CROSS/BLUE SHIELD

## 2015-02-14 LAB — BASIC METABOLIC PANEL
ANION GAP: 8 (ref 5–15)
BUN: <5 mg/dL — ABNORMAL LOW (ref 6–20)
CALCIUM: 8.9 mg/dL (ref 8.9–10.3)
CO2: 27 mmol/L (ref 22–32)
Chloride: 107 mmol/L (ref 101–111)
Creatinine, Ser: 0.74 mg/dL (ref 0.44–1.00)
GLUCOSE: 86 mg/dL (ref 65–99)
POTASSIUM: 3.9 mmol/L (ref 3.5–5.1)
SODIUM: 142 mmol/L (ref 135–145)

## 2015-02-14 MED ORDER — GADOBENATE DIMEGLUMINE 529 MG/ML IV SOLN
13.0000 mL | Freq: Once | INTRAVENOUS | Status: AC | PRN
  Administered 2015-02-14: 13 mL via INTRAVENOUS

## 2015-02-14 MED ORDER — SACCHAROMYCES BOULARDII 250 MG PO CAPS
250.0000 mg | ORAL_CAPSULE | Freq: Two times a day (BID) | ORAL | Status: DC
  Administered 2015-02-14 – 2015-02-19 (×12): 250 mg via ORAL
  Filled 2015-02-14 (×12): qty 1

## 2015-02-14 MED ORDER — FLEET ENEMA 7-19 GM/118ML RE ENEM
1.0000 | ENEMA | Freq: Once | RECTAL | Status: AC
  Administered 2015-02-15: 1 via RECTAL
  Filled 2015-02-14: qty 1

## 2015-02-14 MED ORDER — LORAZEPAM 2 MG/ML IJ SOLN
1.0000 mg | Freq: Once | INTRAMUSCULAR | Status: DC | PRN
  Administered 2015-02-19: 1 mg via INTRAVENOUS
  Filled 2015-02-14: qty 1

## 2015-02-14 NOTE — Progress Notes (Signed)
[facility] Progress Note  Subjective: No major complaints today. No complaints of abdominal pain. She is eating. She is having bowel movements but mostly diarrhea at this time. Denies seeing blood in the stool.  Objective: Vital signs in last 24 hours: Temp:  [98.4 F (36.9 C)] 98.4 F (36.9 C) (11/22 0526) Pulse Rate:  [71-74] 73 (11/22 0526) Resp:  [16-18] 16 (11/22 0526) BP: (113-115)/(74-77) 113/74 mmHg (11/21 2100) SpO2:  [99 %-100 %] 99 % (11/22 0526) Weight change:    PE:  No distress  Heart regular rhythm  Lungs clear  Abdomen: Bowel sounds present, soft, nontender    Lab Results: Results for orders placed or performed during the hospital encounter of 02/10/15 (from the past 24 hour(s))  Basic metabolic panel     Status: Abnormal   Collection Time: 02/14/15  3:28 AM  Result Value Ref Range   Sodium 142 135 - 145 mmol/L   Potassium 3.9 3.5 - 5.1 mmol/L   Chloride 107 101 - 111 mmol/L   CO2 27 22 - 32 mmol/L   Glucose, Bld 86 65 - 99 mg/dL   BUN <5 (L) 6 - 20 mg/dL   Creatinine, Ser 0.74 0.44 - 1.00 mg/dL   Calcium 8.9 8.9 - 10.3 mg/dL   GFR calc non Af Amer >60 >60 mL/min   GFR calc Af Amer >60 >60 mL/min   Anion gap 8 5 - 15    Studies/Results: No results found.    Assessment: Sigmoid diverticulitis with abscess, versus malignancy in the sigmoid  Plan:   We will proceed with a flexible sigmoidoscopy tomorrow to try and determine if we are dealing with simply an inflammatory process or if there could be a tumor.    Cassell Clement 02/14/2015, 9:36 AM  Pager: [phone] If no answer or after 5 PM call [phone]

## 2015-02-14 NOTE — Progress Notes (Signed)
  Subjective: She denies any pain, some nausea which she attributes to high sugar diet on full liquids.  Multiple loose stools.  Objective: Vital signs in last 24 hours: Temp:  [98.4 F (36.9 C)] 98.4 F (36.9 C) (11/22 0526) Pulse Rate:  [71-74] 73 (11/22 0526) Resp:  [16-18] 16 (11/22 0526) BP: (113-115)/(74-77) 113/74 mmHg (11/21 2100) SpO2:  [99 %-100 %] 99 % (11/22 0526) Last BM Date: 02/13/15 (mid and LLQ) 880 PO BM x 8 Afebrile, VSS BMP OK Intake/Output from previous day: 11/21 0701 - 11/22 0700 In: 2720 [P.O.:880; I.V.:1140; IV Piggyback:700] Out: 2850 [Urine:2850] Intake/Output this shift:    General appearance: alert, cooperative and no distress GI: soft, non-tender; bowel sounds normal; no masses,  no organomegaly  Lab Results:   Recent Labs  02/12/15 0350  WBC 11.2*  HGB 10.8*  HCT 34.0*  PLT 401*    BMET  Recent Labs  02/12/15 0350 02/14/15 0328  NA 140 142  K 4.2 3.9  CL 108 107  CO2 23 27  GLUCOSE 84 86  BUN <5* <5*  CREATININE 0.63 0.74  CALCIUM 8.7* 8.9   PT/INR No results for input(s): LABPROT, INR in the last 72 hours.   Recent Labs Lab 02/10/15 1703  AST 15  ALT 14  ALKPHOS 76  BILITOT 0.3  PROT 7.2  ALBUMIN 3.3*     Lipase  No results found for: LIPASE   Studies/Results: No results found.  Medications: . ciprofloxacin  400 mg Intravenous Q12H  . heparin  5,000 Units Subcutaneous 3 times per day  . metronidazole  500 mg Intravenous Q8H  . polyethylene glycol  17 g Oral Daily    Assessment/Plan Pericolonic sigmoid abscess/Inflammatory changes/diverticulitis vs neoplasm Acute on chronic constipation Weight loss on diet Liver lesions or uncertain significance Anemia Antibiotics: Day 5 Cipro/Flagyl DVT: Heparin/SCD  Plan:  Flexible sigmoidoscopy tomorrow, I will add probiotic to her meds.        LOS: 4 days    Carla Mills 02/14/2015

## 2015-02-14 NOTE — Progress Notes (Signed)
[facility] PROGRESS NOTE  Carla Mills [id number] DOB: 12-21-1970 DOA: 02/10/2015  PCP: No PCP Per Patient  Brief HPI: 44 year old Caucasian female with no significant past medical history except for on and off constipation for the past 1 year, presented with abdominal pain. CT scan was done which revealed marked edema and stranding in the rectosigmoid junction with a 3 cm abscess. Patient was hospitalized for further management.  Consultants: Gastroenterology, general surgery  Procedures: None yet  Antibiotics: Ciprofloxacin and Flagyl 11/18--  Subjective: Patient feels nauseated this morning. Continues to have loose stools. Denies any abdominal pain.   Objective: Vital Signs  Filed Vitals:   02/13/15 0531 02/13/15 1454 02/13/15 2100 02/14/15 0526  BP: 92/60 115/77 113/74   Pulse: 73 74 71 73  Temp: 98.9 F (37.2 C) 98.4 F (36.9 C) 98.4 F (36.9 C) 98.4 F (36.9 C)  TempSrc: Oral Oral Oral Oral  Resp: 18 18 16 16   Height:      Weight:      SpO2: 100% 100% 99% 99%    Intake/Output Summary (Last 24 hours) at 02/14/15 0951 Last data filed at 02/14/15 0700  Gross per 24 hour  Intake   2720 ml  Output   1650 ml  Net   1070 ml   Filed Weights   02/10/15 1632  Weight: 61.236 kg (135 lb)    General appearance: alert, cooperative, appears stated age and no distress Resp: clear to auscultation bilaterally Cardio: regular rate and rhythm, S1, S2 normal, no murmur, click, rub or gallop GI: Abdomen is soft. Nontender today. No masses or organomegaly. Bowel sounds are present. Neurologic: Alert and oriented 3. Motor strength equal bilateral upper and lower extremity. Gait is normal.  Lab Results:  Basic Metabolic Panel:  Recent Labs Lab 02/10/15 1703 02/11/15 0335 02/12/15 0350 02/14/15 0328  NA 138 140 140 142  K 3.9 3.8 4.2 3.9  CL 104 106 108 107  CO2 25 24 23 27   GLUCOSE 91 77 84 86  BUN 9 6 <5* <5*  CREATININE 0.65 0.67 0.63 0.74    CALCIUM 9.0 8.6* 8.7* 8.9   Liver Function Tests:  Recent Labs Lab 02/10/15 1703  AST 15  ALT 14  ALKPHOS 76  BILITOT 0.3  PROT 7.2  ALBUMIN 3.3*   CBC:  Recent Labs Lab 02/10/15 1703 02/11/15 0335 02/12/15 0350  WBC 15.9* 11.2* 11.2*  NEUTROABS 12.3*  --   --   HGB 11.1* 10.8* 10.8*  HCT 35.1* 34.8* 34.0*  MCV 78.0 78.7 78.3  PLT 447* 423* 401*     Studies/Results: No results found.  Medications:  Scheduled: . ciprofloxacin  400 mg Intravenous Q12H  . heparin  5,000 Units Subcutaneous 3 times per day  . metronidazole  500 mg Intravenous Q8H  . saccharomyces boulardii  250 mg Oral BID   Continuous: . sodium chloride 50 mL/hr at 02/13/15 1806   [id number] **OR** acetaminophen, LORazepam, morphine injection, ondansetron **OR** ondansetron (ZOFRAN) IV  Assessment/Plan:  Principal Problem:   Colonic diverticular abscess Active Problems:   Constipation   Anemia, iron deficiency   Liver lesion    Colonic abscess with constipation. Thought to be secondary to acute diverticulitis. Continue intravenous antibiotics. Gastroenterology and general surgery is following. Concern was raised about malignancy, though it's less likely considering her age. CEA is 2.1. Subcentimeter liver lesions also noted. She mentioned on and off history of constipation for the past 1 year. TSH is normal. Discussed with IR and unfortunately,  the location of the abscess is such that they are unable to reach it, even with CT guidance due to presence of bowel. Continue current management for now. WBC is noted to be better. Will be repeated tomorrow. Plan is for flexible sigmoidoscopy on Wednesday to rule out a mass. If there is no mass she will undergo CT scan to evaluate the abscess. Continues to have loose bowels. Stop Miralax for now.   Normocytic Anemia  Heme negative on exam. Anemia panel reviewed. No evidence of iron deficiency. Although she was given iron dextran 11/19 which  could have influenced the results.  Liver lesions Possible hemangiomas, cysts, or worse case metastatic disease. Patient denies any history of colon cancer in the family. No history of liver disease in the family. Will obtain MRI of the liver. Discussed with the patient. She reports a history of claustrophobia. She will be premedicated.   DVT Prophylaxis: Subcutaneous heparin    Code Status: Full code  Family Communication: Discussed in detail with the patient and her husband Disposition Plan: Continue current management. Continue to mobilize.    LOS: 4 days   [facility] Pager [phone] 02/14/2015, 9:51 AM  If 7PM-7AM, please contact night-coverage at [email or web address], password [facility]

## 2015-02-15 ENCOUNTER — Encounter (HOSPITAL_COMMUNITY): Payer: Self-pay

## 2015-02-15 ENCOUNTER — Encounter (HOSPITAL_COMMUNITY): Admission: AD | Disposition: A | Discharge status: Home / Self Care | Payer: Self-pay | Source: Ambulatory Visit

## 2015-02-15 DIAGNOSIS — K5720 Diverticulitis of large intestine with perforation and abscess without bleeding: Secondary | ICD-10-CM

## 2015-02-15 HISTORY — PX: FLEXIBLE SIGMOIDOSCOPY: SHX5431

## 2015-02-15 LAB — BASIC METABOLIC PANEL
ANION GAP: 8 (ref 5–15)
BUN: <5 mg/dL — ABNORMAL LOW (ref 6–20)
CO2: 25 mmol/L (ref 22–32)
Calcium: 8.8 mg/dL — ABNORMAL LOW (ref 8.9–10.3)
Chloride: 110 mmol/L (ref 101–111)
Creatinine, Ser: 0.66 mg/dL (ref 0.44–1.00)
GFR calc Af Amer: >60 mL/min (ref >60)
GLUCOSE: 82 mg/dL (ref 65–99)
POTASSIUM: 4 mmol/L (ref 3.5–5.1)
Sodium: 143 mmol/L (ref 135–145)

## 2015-02-15 LAB — CBC
HEMATOCRIT: 32.9 % — AB (ref 36.0–46.0)
Hemoglobin: 10.3 g/dL — ABNORMAL LOW (ref 12.0–15.0)
MCH: 24.6 pg — ABNORMAL LOW (ref 26.0–34.0)
MCHC: 31.3 g/dL (ref 30.0–36.0)
MCV: 78.7 fL (ref 78.0–100.0)
Platelets: 423 10*3/uL — ABNORMAL HIGH (ref 150–400)
RBC: 4.18 MIL/uL (ref 3.87–5.11)
RDW: 15.4 % (ref 11.5–15.5)
WBC: 8.9 10*3/uL (ref 4.0–10.5)

## 2015-02-15 SURGERY — SIGMOIDOSCOPY, FLEXIBLE
Anesthesia: Moderate Sedation

## 2015-02-15 MED ORDER — FENTANYL CITRATE (PF) 100 MCG/2ML IJ SOLN
INTRAMUSCULAR | Status: DC | PRN
  Administered 2015-02-15: 25 ug via INTRAVENOUS

## 2015-02-15 MED ORDER — SODIUM CHLORIDE 0.9 % IV SOLN: INTRAVENOUS | Status: DC

## 2015-02-15 MED ORDER — DIPHENHYDRAMINE HCL 50 MG/ML IJ SOLN
INTRAMUSCULAR | Status: AC
  Filled 2015-02-15: qty 1

## 2015-02-15 MED ORDER — MIDAZOLAM HCL 5 MG/ML IJ SOLN
INTRAMUSCULAR | Status: AC
  Filled 2015-02-15: qty 2

## 2015-02-15 MED ORDER — FENTANYL CITRATE (PF) 100 MCG/2ML IJ SOLN
INTRAMUSCULAR | Status: AC
  Filled 2015-02-15: qty 2

## 2015-02-15 MED ORDER — MIDAZOLAM HCL 10 MG/2ML IJ SOLN
INTRAMUSCULAR | Status: DC | PRN
  Administered 2015-02-15: 1 mg via INTRAVENOUS
  Administered 2015-02-15: 2 mg via INTRAVENOUS

## 2015-02-15 NOTE — Progress Notes (Signed)
Day of Surgery  Subjective: Pt back from colonoscopy Mass lesion bx could be inflammation vs  malignancy  Objective: Vital signs in last 24 hours: Temp:  [98.1 F (36.7 C)-98.4 F (36.9 C)] 98.2 F (36.8 C) (11/23 0538) Pulse Rate:  [63-74] 63 (11/23 0538) Resp:  [16] 16 (11/23 0538) BP: (95-119)/(56-76) 99/63 mmHg (11/23 0538) SpO2:  [97 %-99 %] 99 % (11/23 0538) Last BM Date: 02/15/15 1500 PO  I just made her NPO Full liquids BM x 2 Afebrile, VSS Labs OK Intake/Output from previous day: 11/22 0701 - 11/23 0700 In: 3436.2 [P.O.:1500; I.V.:1336.2; IV Piggyback:600] Out: [id number] [Urine:3550] Intake/Output this shift:    GI: soft, non-tender; bowel sounds normal; no masses,  no organomegaly  Lab Results:   Recent Labs  02/15/15 0239  WBC 8.9  HGB 10.3*  HCT 32.9*  PLT 423*    BMET  Recent Labs  02/14/15 0328 02/15/15 0239  NA 142 143  K 3.9 4.0  CL 107 110  CO2 27 25  GLUCOSE 86 82  BUN <5* <5*  CREATININE 0.74 0.66  CALCIUM 8.9 8.8*   PT/INR No results for input(s): LABPROT, INR in the last 72 hours.   Recent Labs Lab 02/10/15 1703  AST 15  ALT 14  ALKPHOS 76  BILITOT 0.3  PROT 7.2  ALBUMIN 3.3*     Lipase  No results found for: LIPASE   Studies/Results: Mr Liver W Wo Contrast  02/14/2015  CLINICAL DATA:  44 year old female with past medical history of intermittent constipation for 1 year presenting with abdominal pain. Recent CT examination demonstrating presumable diverticular abscess. Multiple hypodensities in the liver noted on the CT examination concerning for potential infection versus metastatic disease. Further evaluation. EXAM: MRI ABDOMEN WITHOUT AND WITH CONTRAST TECHNIQUE: Multiplanar multisequence MR imaging of the abdomen was performed both before and after the administration of intravenous contrast. CONTRAST:  18mL MULTIHANCE GADOBENATE DIMEGLUMINE 529 MG/ML IV SOLN COMPARISON:  CT of the abdomen and pelvis 02/10/2015. FINDINGS:  Lower chest:  Unremarkable. Hepatobiliary: There are multiple sub cm lesions in the liver which are low T1 signal intensity, high T2 signal intensity, and do not enhance, most compatible with tiny cysts and/or biliary hamartomas. No other larger more suspicious appearing cystic or solid hepatic lesions are noted. No intra or extrahepatic biliary ductal dilatation. Gallbladder is normal in appearance. Pancreas: No pancreatic mass. No pancreatic ductal dilatation. No pancreatic or peripancreatic fluid or inflammatory changes. Spleen: Unremarkable. Adrenals/Urinary Tract: Bilateral adrenal glands and bilateral kidneys are normal in appearance. No hydroureteronephrosis in the visualized abdomen. Stomach/Bowel: Unremarkable. Vascular/Lymphatic: Mild ectasia of the celiac axis which measures up to 1 cm in diameter. No lymphadenopathy noted in the visualized abdomen. Other: No significant volume of ascites in the visualized peritoneal cavity. Musculoskeletal: No aggressive appearing osseous lesions are noted in the visualized portions of the skeleton. IMPRESSION: 1. The liver lesions of concern have imaging characteristics compatible with tiny cysts and/or biliary hamartomas. No definite findings to suggest developing intrahepatic abscesses or metastatic disease. 2. Additional incidental findings, as above. Electronically Signed   By: Vinnie Langton M.D.   On: 02/14/2015 16:40    Medications: . ciprofloxacin  400 mg Intravenous Q12H  . heparin  5,000 Units Subcutaneous 3 times per day  . metronidazole  500 mg Intravenous Q8H  . saccharomyces boulardii  250 mg Oral BID    Assessment/Plan Pericolonic sigmoid abscess/Inflammatory changes/diverticulitis vs neoplasm Acute on chronic constipation Weight loss on diet Liver lesions or uncertain  significance Anemia Antibiotics: Day 6 Cipro/Flagyl DVT: Heparin/SCD  soft diet  Await biopsy results   LOS: 5 days    Erroll Luna MD  02/15/2015

## 2015-02-15 NOTE — Progress Notes (Addendum)
[facility] PROGRESS NOTE  Carla Mills [id number] DOB: Sep 15, 1970 DOA: 02/10/2015  PCP: No PCP Per Patient  Brief HPI: 44 year old Caucasian female with no significant past medical history except for on and off constipation for the past 1 year, presented with abdominal pain. CT scan was done which revealed marked edema and stranding in the rectosigmoid junction with a 3 cm abscess. Patient was hospitalized for further management.  Consultants: Gastroenterology, general surgery  Procedures: Flexible sigmoidoscopy-inflammatory reaction of the mucosa in the rectosigmoid region with mass effect, repeat sigmoidoscopy/colonoscopy recommended in 8 weeks  Antibiotics: Ciprofloxacin and Flagyl 11/18--  Subjective: Pt back from colonoscopy Mass lesion bx could be inflammation vs malignancy  Objective: Vital Signs  Filed Vitals:   02/15/15 0900 02/15/15 0908 02/15/15 0910 02/15/15 0920  BP: 97/51  110/70 107/63  Pulse: 65  66 66  Temp:  98.1 F (36.7 C)    TempSrc:  Oral    Resp: 24  19 18   Height:      Weight:      SpO2: 100%  99% 100%    Intake/Output Summary (Last 24 hours) at 02/15/15 1311 Last data filed at 02/15/15 0910  Gross per 24 hour  Intake 3246.17 ml  Output   2700 ml  Net 546.17 ml   Filed Weights   02/10/15 1632 02/15/15 0748  Weight: 61.236 kg (135 lb) 61.236 kg (135 lb)    General appearance: alert, cooperative, appears stated age and no distress Resp: clear to auscultation bilaterally Cardio: regular rate and rhythm, S1, S2 normal, no murmur, click, rub or gallop GI: Abdomen is soft. Nontender today. No masses or organomegaly. Bowel sounds are present. Neurologic: Alert and oriented 3. Motor strength equal bilateral upper and lower extremity. Gait is normal.  Lab Results:  Basic Metabolic Panel:  Recent Labs Lab 02/10/15 1703 02/11/15 0335 02/12/15 0350 02/14/15 0328 02/15/15 0239  NA 138 140 140 142 143  K 3.9 3.8 4.2 3.9 4.0   CL 104 106 108 107 110  CO2 25 24 23 27 25   GLUCOSE 91 77 84 86 82  BUN 9 6 <5* <5* <5*  CREATININE 0.65 0.67 0.63 0.74 0.66  CALCIUM 9.0 8.6* 8.7* 8.9 8.8*   Liver Function Tests:  Recent Labs Lab 02/10/15 1703  AST 15  ALT 14  ALKPHOS 76  BILITOT 0.3  PROT 7.2  ALBUMIN 3.3*   CBC:  Recent Labs Lab 02/10/15 1703 02/11/15 0335 02/12/15 0350 02/15/15 0239  WBC 15.9* 11.2* 11.2* 8.9  NEUTROABS 12.3*  --   --   --   HGB 11.1* 10.8* 10.8* 10.3*  HCT 35.1* 34.8* 34.0* 32.9*  MCV 78.0 78.7 78.3 78.7  PLT 447* 423* 401* 423*     Studies/Results: Mr Liver W Wo Contrast  02/14/2015  CLINICAL DATA:  44 year old female with past medical history of intermittent constipation for 1 year presenting with abdominal pain. Recent CT examination demonstrating presumable diverticular abscess. Multiple hypodensities in the liver noted on the CT examination concerning for potential infection versus metastatic disease. Further evaluation. EXAM: MRI ABDOMEN WITHOUT AND WITH CONTRAST TECHNIQUE: Multiplanar multisequence MR imaging of the abdomen was performed both before and after the administration of intravenous contrast. CONTRAST:  11mL MULTIHANCE GADOBENATE DIMEGLUMINE 529 MG/ML IV SOLN COMPARISON:  CT of the abdomen and pelvis 02/10/2015. FINDINGS: Lower chest:  Unremarkable. Hepatobiliary: There are multiple sub cm lesions in the liver which are low T1 signal intensity, high T2 signal intensity, and do not enhance, most  compatible with tiny cysts and/or biliary hamartomas. No other larger more suspicious appearing cystic or solid hepatic lesions are noted. No intra or extrahepatic biliary ductal dilatation. Gallbladder is normal in appearance. Pancreas: No pancreatic mass. No pancreatic ductal dilatation. No pancreatic or peripancreatic fluid or inflammatory changes. Spleen: Unremarkable. Adrenals/Urinary Tract: Bilateral adrenal glands and bilateral kidneys are normal in appearance. No  hydroureteronephrosis in the visualized abdomen. Stomach/Bowel: Unremarkable. Vascular/Lymphatic: Mild ectasia of the celiac axis which measures up to 1 cm in diameter. No lymphadenopathy noted in the visualized abdomen. Other: No significant volume of ascites in the visualized peritoneal cavity. Musculoskeletal: No aggressive appearing osseous lesions are noted in the visualized portions of the skeleton. IMPRESSION: 1. The liver lesions of concern have imaging characteristics compatible with tiny cysts and/or biliary hamartomas. No definite findings to suggest developing intrahepatic abscesses or metastatic disease. 2. Additional incidental findings, as above. Electronically Signed   By: Vinnie Langton M.D.   On: 02/14/2015 16:40    Medications:  Scheduled: . ciprofloxacin  400 mg Intravenous Q12H  . heparin  5,000 Units Subcutaneous 3 times per day  . metronidazole  500 mg Intravenous Q8H  . saccharomyces boulardii  250 mg Oral BID   Continuous: . sodium chloride 500 mL (02/15/15 0801)  . sodium chloride    . sodium chloride     [id number] **OR** acetaminophen, LORazepam, morphine injection, ondansetron **OR** ondansetron (ZOFRAN) IV  Assessment/Plan:  Principal Problem:   Colonic diverticular abscess Active Problems:   Constipation   Anemia, iron deficiency   Liver lesion    Colonic abscess with constipation. Thought to be secondary to acute diverticulitis. Continue intravenous antibiotics. Gastroenterology and general surgery is following. Concern was raised about malignancy, though it's less likely considering her age. CEA is 2.1. Subcentimeter liver lesions also noted. MRI liver  negative for malignancy .She mentioned on and off history of constipation for the past 1 year. TSH is normal. Discussed with IR and unfortunately, the location of the abscess is such that they are unable to reach it, even with CT guidance due to presence of bowel. Continue abx . Day 6  Cipro/Flagyl, . WBC is noted to be better.   flexible sigmoidoscopy  shows rectosigmoid inflammatory mass ,bx pending .   Normocytic Anemia  Heme negative on exam. Anemia panel reviewed. No evidence of iron deficiency. Although she was given iron dextran 11/19 which could have influenced the results.  Liver lesions Possible hemangiomas, cysts, doubt  metastatic disease. Patient denies any history of colon cancer in the family. No history of liver disease in the family.     DVT Prophylaxis: Subcutaneous heparin    Code Status: Full code  Family Communication: Discussed in detail with the patient and her husband Disposition Plan: Continue current management. Continue to mobilize.    LOS: 5 days   [facility]  [facility] Pager [phone]  02/15/2015, 1:11 PM  If 7PM-7AM, please contact night-coverage at [email or web address], password [facility]

## 2015-02-15 NOTE — Op Note (Signed)
[facility] [place], [id number]   FLEXIBLE SIGMOIDOSCOPY PROCEDURE REPORT  PATIENT: Carla Mills, Carla Mills  MR#: [id number] BIRTHDATE: 1971-03-23 , 54  yrs. old GENDER: female ENDOSCOPIST: Acquanetta Sit, MD REFERRED BY: PROCEDURE DATE:  02/15/2015 PROCEDURE:   flexible sigmoidoscopy with biopsy ASA CLASS:   2 INDICATIONS:rule out diverticulitis versus tumor MEDICATIONS: fentanyl 25 g IV, Versed 3 mg IV  DESCRIPTION OF PROCEDURE:   After the risks benefits and alternatives of the procedure were thoroughly explained, informed consent was obtained.  digital rectal exam was normal      The Pentax colonoscope       endoscope was introduced through the anus and advanced to the sigmoid colon      , The exam was Without limitations.    The quality of the prep was good      . Estimated blood loss is zero unless otherwise noted in this procedure report. The instrument was then slowly withdrawn as the mucosa was fully examined.  Findings: The pediatric colonoscope was inserted into the rectum and the distal and mid rectum looked normal. At the rectosigmoid junction in the distal portion there was a raised mass noted which is seen on image 002 and 003 below. This seemed to be mostly inflammatory in nature visually. Just beyond this the mucosa had some spotty reddish inflammatory appearing areas on the colon wall and then just above that at approximately 20 cm another masslike lesion came into view around the corner which is seen on image 001 at the top right corner of the picture. I could not get a better view of this because of the angulation. This may all be simply inflammatory but biopsies were obtained. Of note is that I did not see any diverticula on this examination.the scope was only advanced to 20 cm with minimal air insufflation.         The scope was then withdrawn from the patient and the procedure terminated.  COMPLICATIONS: There were  no immediate complications.  ENDOSCOPIC IMPRESSION:probable inflammatory mucosal reaction of rectosigmoid with mass effect as noted, tumor still a possibility. A few biopsies were obtained.   RECOMMENDATIONS:check biopsies. Continue antibiotic treatment for diverticulitis with abscess. if the biopsies are negative for malignancy I would recommend repeating sigmoidoscopy and/or full colonoscopy in 8 weeks provided her clinical course improves.   REPEAT EXAM:  eSignedAcquanetta Sit, MD 02/15/2015 9:14 AM   CC:

## 2015-02-16 NOTE — Progress Notes (Signed)
Discussed with Dr. Kieth Brightly, patient is medically stable, no other active medical issues, Waiting on the results of her biopsy, waiting for surgery to make a decision about surgery based on her pathology Okay to transfer to  [place] per Dr. Kieth Brightly Medicine will sign off

## 2015-02-16 NOTE — Progress Notes (Signed)
1 Day Post-Op  Subjective: She looks and feels great.  Dr. Brantley Stage told her it would be most expedient for her to be here in hospital if path came back positive for a cancer and get done here next week.    Objective: Vital signs in last 24 hours: Temp:  [98.1 F (36.7 C)-98.4 F (36.9 C)] 98.3 F (36.8 C) (11/24 0517) Pulse Rate:  [65-79] 76 (11/24 0517) Resp:  [16-24] 16 (11/24 0517) BP: (90-124)/(51-79) 90/53 mmHg (11/24 0517) SpO2:  [95 %-100 %] 100 % (11/24 0517) Weight:  [66.18 kg (145 lb 14.4 oz)] 66.18 kg (145 lb 14.4 oz) (11/24 0517) Last BM Date: 02/15/15 1200 PO Soft diet 2500 urine No BM recorded Afebrile, VSS. No labs CEA and AFP are normal Intake/Output from previous day: 11/23 0701 - 11/24 0700 In: 1952 [P.O.:1200; I.V.:552; IV Piggyback:200] Out: 2500 [Urine:2500] Intake/Output this shift:    General appearance: alert, cooperative and no distress Resp: clear to auscultation bilaterally GI: soft, non-tender; bowel sounds normal; no masses,  no organomegaly  Lab Results:   Recent Labs  02/15/15 0239  WBC 8.9  HGB 10.3*  HCT 32.9*  PLT 423*    BMET  Recent Labs  02/14/15 0328 02/15/15 0239  NA 142 143  K 3.9 4.0  CL 107 110  CO2 27 25  GLUCOSE 86 82  BUN <5* <5*  CREATININE 0.74 0.66  CALCIUM 8.9 8.8*   PT/INR No results for input(s): LABPROT, INR in the last 72 hours.   Recent Labs Lab 02/10/15 1703  AST 15  ALT 14  ALKPHOS 76  BILITOT 0.3  PROT 7.2  ALBUMIN 3.3*     Lipase  No results found for: LIPASE   Studies/Results: Mr Liver W Wo Contrast  02/14/2015  CLINICAL DATA:  44 year old female with past medical history of intermittent constipation for 1 year presenting with abdominal pain. Recent CT examination demonstrating presumable diverticular abscess. Multiple hypodensities in the liver noted on the CT examination concerning for potential infection versus metastatic disease. Further evaluation. EXAM: MRI ABDOMEN WITHOUT  AND WITH CONTRAST TECHNIQUE: Multiplanar multisequence MR imaging of the abdomen was performed both before and after the administration of intravenous contrast. CONTRAST:  33mL MULTIHANCE GADOBENATE DIMEGLUMINE 529 MG/ML IV SOLN COMPARISON:  CT of the abdomen and pelvis 02/10/2015. FINDINGS: Lower chest:  Unremarkable. Hepatobiliary: There are multiple sub cm lesions in the liver which are low T1 signal intensity, high T2 signal intensity, and do not enhance, most compatible with tiny cysts and/or biliary hamartomas. No other larger more suspicious appearing cystic or solid hepatic lesions are noted. No intra or extrahepatic biliary ductal dilatation. Gallbladder is normal in appearance. Pancreas: No pancreatic mass. No pancreatic ductal dilatation. No pancreatic or peripancreatic fluid or inflammatory changes. Spleen: Unremarkable. Adrenals/Urinary Tract: Bilateral adrenal glands and bilateral kidneys are normal in appearance. No hydroureteronephrosis in the visualized abdomen. Stomach/Bowel: Unremarkable. Vascular/Lymphatic: Mild ectasia of the celiac axis which measures up to 1 cm in diameter. No lymphadenopathy noted in the visualized abdomen. Other: No significant volume of ascites in the visualized peritoneal cavity. Musculoskeletal: No aggressive appearing osseous lesions are noted in the visualized portions of the skeleton. IMPRESSION: 1. The liver lesions of concern have imaging characteristics compatible with tiny cysts and/or biliary hamartomas. No definite findings to suggest developing intrahepatic abscesses or metastatic disease. 2. Additional incidental findings, as above. Electronically Signed   By: Vinnie Langton M.D.   On: 02/14/2015 16:40    Medications: . ciprofloxacin  400 mg Intravenous Q12H  . heparin  5,000 Units Subcutaneous 3 times per day  . metronidazole  500 mg Intravenous Q8H  . saccharomyces boulardii  250 mg Oral BID    Assessment/Plan Pericolonic sigmoid  abscess/Inflammatory changes/diverticulitis vs neoplasm Acute on chronic constipation Weight loss on diet Liver lesions or uncertain significance Anemia Antibiotics: Day 7 Cipro/Flagyl DVT: Heparin/SCD soft diet  Await biopsy results path still pending   Plan:  Will discuss with GI, and Medicine along with my attendings today.    LOS: 6 days    Carla Mills 02/16/2015

## 2015-02-16 NOTE — Progress Notes (Signed)
Pt refuses heparin shots but is ambulatory on unit

## 2015-02-17 ENCOUNTER — Encounter (HOSPITAL_COMMUNITY): Payer: Self-pay | Admitting: Gastroenterology

## 2015-02-17 NOTE — Progress Notes (Signed)
2 Days Post-Op  Subjective: She feels full, constipated, she has some diarrhea yesterday, but nothing since then.  I have not discussed phone report.    Objective: Vital signs in last 24 hours: Temp:  [97.7 F (36.5 C)-98.9 F (37.2 C)] 98.6 F (37 C) (11/25 0604) Pulse Rate:  [68-76] 70 (11/25 0604) Resp:  [18] 18 (11/25 0604) BP: (91-97)/(54-71) 92/54 mmHg (11/25 0604) SpO2:  [98 %-100 %] 98 % (11/25 0604) Last BM Date: 02/16/15 240 PO No BM recorded Diet:  Soft  Afebrile, VSS, labs OK Intake/Output from previous day: 11/24 0701 - 11/25 0700 In: 240 [P.O.:240] Out: 2600 [Urine:2600] Intake/Output this shift: Total I/O In: -  Out: 400 [Urine:400]  General appearance: alert, cooperative and no distress GI: soft, non-tender; bowel sounds normal; no masses,  no organomegaly  Lab Results:   Recent Labs  02/15/15 0239  WBC 8.9  HGB 10.3*  HCT 32.9*  PLT 423*    BMET  Recent Labs  02/15/15 0239  NA 143  K 4.0  CL 110  CO2 25  GLUCOSE 82  BUN <5*  CREATININE 0.66  CALCIUM 8.8*   PT/INR No results for input(s): LABPROT, INR in the last 72 hours.   Recent Labs Lab 02/10/15 1703  AST 15  ALT 14  ALKPHOS 76  BILITOT 0.3  PROT 7.2  ALBUMIN 3.3*     Lipase  No results found for: LIPASE   Studies/Results: No results found.  Medications: . ciprofloxacin  400 mg Intravenous Q12H  . heparin  5,000 Units Subcutaneous 3 times per day  . metronidazole  500 mg Intravenous Q8H  . saccharomyces boulardii  250 mg Oral BID    Assessment/Plan Pericolonic sigmoid abscess/Inflammatory changes/diverticulitis vs neoplasm Acute on chronic constipation Weight loss on diet Liver lesions or uncertain significance Anemia Antibiotics: Day 7 Cipro/Flagyl DVT: Heparin/SCD soft diet  Await biopsy results: phone reportpath high grade dysplasia, ? Carcinoma?  Results should be transcribed later today.    Plan:  I will await results of final path, and  discuss with Dr. Marlou Starks and Ninfa Linden.    LOS: 7 days    Carla Mills 02/17/2015

## 2015-02-17 NOTE — Progress Notes (Signed)
Dear Doctor: Carla Mills This patient has been identified as a candidate for PICC for the following reason (s): IV therapy over 48 hours and restarts due to phlebitis and infiltration in 24 hours If you agree, please write an order for the indicated device. For any questions contact the Vascular Access Team at [phone] if no answer, please leave a message.  Thank you for supporting the early vascular access assessment program.

## 2015-02-17 NOTE — Progress Notes (Signed)
Dear Doctor: Excell Seltzer This patient has been identified as a candidate for PICC for the following reason (s): IV therapy over 48 hours and poor veins/poor circulatory system (CHF, COPD, emphysema, diabetes, steroid use, IV drug abuse, etc.) If you agree, please write an order for the indicated device. For any questions contact the Vascular Access Team at [phone] if no answer, please leave a message.  Thank you for supporting the early vascular access assessment program.

## 2015-02-17 NOTE — Progress Notes (Signed)
Dr Michail Sermon called me and communicated that he is not the provider who needs to make the call regarding picc line placement. IV team contacted and notified to get in touch with the surgical team

## 2015-02-17 NOTE — Progress Notes (Signed)
Patient ID: Carla Mills, female   DOB: [birth year], 44 y.o.   MRN: [id number] [facility] Gastroenterology Progress Note  Carla Mills 44 y.o. Sep 16, 1970   Subjective: Loose stool yesterday. No stools today. Lower abdominal pain. Denies N/V. Tolerating soft diet. Husband at bedside.  Objective: Vital signs in last 24 hours: Filed Vitals:   02/16/15 2132 02/17/15 0604  BP: 91/55 92/54  Pulse: 76 70  Temp: 98.9 F (37.2 C) 98.6 F (37 C)  Resp: 18 18    Physical Exam: Gen: alert, no acute distress, thin HEENT: anicteric CV: RRR Chest: CTA B Abd: lower abdominal tenderness with minimal guarding, soft, nondistended, +BS Ext: no edema  Lab Results:  Recent Labs  02/15/15 0239  NA 143  K 4.0  CL 110  CO2 25  GLUCOSE 82  BUN <5*  CREATININE 0.66  CALCIUM 8.8*   No results for input(s): AST, ALT, ALKPHOS, BILITOT, PROT, ALBUMIN in the last 72 hours.  Recent Labs  02/15/15 0239  WBC 8.9  HGB 10.3*  HCT 32.9*  MCV 78.7  PLT 423*   No results for input(s): LABPROT, INR in the last 72 hours.    Assessment/Plan: Rectosigmoid mass with pain and change in bowel habits - awaiting final path to determine if malignancy is seen. Continue supportive care. Surgery following. Dr. Penelope Coop will f/u tomorrow.   [place] C. 02/17/2015, 9:00 AM  Pager [phone]  If no answer or after 5 PM call [phone]

## 2015-02-18 ENCOUNTER — Inpatient Hospital Stay (HOSPITAL_COMMUNITY): Payer: BLUE CROSS/BLUE SHIELD

## 2015-02-18 MED ORDER — SODIUM CHLORIDE 0.9 % IJ SOLN
10.0000 mL | Freq: Two times a day (BID) | INTRAMUSCULAR | Status: DC
  Administered 2015-02-18 – 2015-02-22 (×8): 10 mL

## 2015-02-18 MED ORDER — SODIUM CHLORIDE 0.9 % IJ SOLN
10.0000 mL | INTRAMUSCULAR | Status: DC | PRN
Start: 2015-02-18 — End: 2015-02-24
  Administered 2015-02-21 – 2015-02-24 (×4): 10 mL
  Filled 2015-02-18 (×4): qty 40

## 2015-02-18 NOTE — Progress Notes (Signed)
Contacted case management on behalf of pt as she had [place]. Pt then spoke to on call case manager over the telephone and had her concerns addressed

## 2015-02-18 NOTE — Progress Notes (Signed)
3 Days Post-Op  Subjective: Doing well, no pain, anxious about the findings.  Worried bowel prep will be an issue since she has not had a formed BM, just some loose stool.  Discussed PICC line and we will go forward with that plan.  Objective: Vital signs in last 24 hours: Temp:  [98.4 F (36.9 C)-99.2 F (37.3 C)] 98.4 F (36.9 C) (11/26 0619) Pulse Rate:  [73-91] 73 (11/26 0619) Resp:  [17-18] 18 (11/26 0619) BP: (99-128)/(55-62) 99/57 mmHg (11/26 0619) SpO2:  [98 %-100 %] 98 % (11/26 0619) Last BM Date: 02/17/15 Soft diet 480 PO Urine 2650 She is 10 liters negative per I/O Afebrile, VSS No labs  Intake/Output from previous day: 11/25 0701 - 11/26 0700 In: 480 [P.O.:480] Out: 2650 [Urine:2650] Intake/Output this shift:    General appearance: alert, cooperative and no distress Resp: clear to auscultation bilaterally GI: soft, non-tender; bowel sounds normal; no masses,  no organomegaly  Lab Results:  No results for input(s): WBC, HGB, HCT, PLT in the last 72 hours.  BMET No results for input(s): NA, K, CL, CO2, GLUCOSE, BUN, CREATININE, CALCIUM in the last 72 hours. PT/INR No results for input(s): LABPROT, INR in the last 72 hours.  No results for input(s): AST, ALT, ALKPHOS, BILITOT, PROT, ALBUMIN in the last 168 hours.   Lipase  No results found for: LIPASE   Studies/Results: No results found.  Medications: . ciprofloxacin  400 mg Intravenous Q12H  . heparin  5,000 Units Subcutaneous 3 times per day  . metronidazole  500 mg Intravenous Q8H  . saccharomyces boulardii  250 mg Oral BID    Assessment/Plan Pericolonic sigmoid abscess/Inflammatory changes/diverticulitis vs neoplasm Acute on chronic constipation Weight loss on diet Liver lesions or uncertain significance Anemia Antibiotics: Day 7 Cipro/Flagyl DVT: Heparin/SCD soft diet    Plan:  Soft diet today, get a PICC line and tomorrow start preop orders for colon resection.   Update labs in AM,  type and screen.   LOS: 8 days    Luman Holway 02/18/2015

## 2015-02-18 NOTE — Progress Notes (Signed)
Peripherally Inserted Central Catheter/Midline Placement  The IV Nurse has discussed with the patient and/or persons authorized to consent for the patient, the purpose of this procedure and the potential benefits and risks involved with this procedure.  The benefits include less needle sticks, lab draws from the catheter and patient may be discharged home with the catheter.  Risks include, but not limited to, infection, bleeding, blood clot (thrombus formation), and puncture of an artery; nerve damage and irregular heat beat.  Alternatives to this procedure were also discussed.  PICC/Midline Placement Documentation  PICC / Midline Double Lumen [id number] PICC Right Basilic 36 cm 0 cm (Active)  Indication for Insertion or Continuance of Line Prolonged intravenous therapies 02/18/2015 11:11 AM  Exposed Catheter (cm) 0 cm 02/18/2015 11:11 AM  Site Assessment Clean;Dry;Intact 02/18/2015 11:11 AM  Lumen #1 Status Flushed;Saline locked;Blood return noted 02/18/2015 11:11 AM  Lumen #2 Status Flushed;Saline locked;Blood return noted 02/18/2015 11:11 AM  Dressing Type Transparent 02/18/2015 11:11 AM  Dressing Status Clean;Dry;Intact 02/18/2015 11:11 AM  Dressing Change Due 02/25/15 02/18/2015 11:11 AM       Rolena Infante 02/18/2015, 11:23 AM

## 2015-02-19 LAB — APTT: aPTT: 34 seconds (ref 24–37)

## 2015-02-19 LAB — CBC
HEMATOCRIT: 35.1 % — AB (ref 36.0–46.0)
HEMOGLOBIN: 11.4 g/dL — AB (ref 12.0–15.0)
MCH: 25.6 pg — AB (ref 26.0–34.0)
MCHC: 32.5 g/dL (ref 30.0–36.0)
MCV: 78.7 fL (ref 78.0–100.0)
Platelets: 387 10*3/uL (ref 150–400)
RBC: 4.46 MIL/uL (ref 3.87–5.11)
RDW: 16.6 % — ABNORMAL HIGH (ref 11.5–15.5)
WBC: 12 10*3/uL — ABNORMAL HIGH (ref 4.0–10.5)

## 2015-02-19 LAB — TYPE AND SCREEN
ABO/RH(D): O POS
ANTIBODY SCREEN: NEGATIVE

## 2015-02-19 LAB — ABO/RH: ABO/RH(D): O POS

## 2015-02-19 LAB — PROTIME-INR
INR: 1.16 (ref 0.00–1.49)
PROTHROMBIN TIME: 15 s (ref 11.6–15.2)

## 2015-02-19 MED ORDER — CLINDAMYCIN PHOSPHATE 900 MG/50ML IV SOLN
900.0000 mg | INTRAVENOUS | Status: AC
  Administered 2015-02-20: 900 mg via INTRAVENOUS
  Filled 2015-02-19 (×2): qty 50

## 2015-02-19 MED ORDER — CHLORHEXIDINE GLUCONATE CLOTH 2 % EX PADS
6.0000 | MEDICATED_PAD | Freq: Once | CUTANEOUS | Status: AC
  Administered 2015-02-19: 6 via TOPICAL

## 2015-02-19 MED ORDER — METRONIDAZOLE 500 MG PO TABS
1000.0000 mg | ORAL_TABLET | Freq: Three times a day (TID) | ORAL | Status: AC
  Administered 2015-02-19 (×3): 1000 mg via ORAL
  Filled 2015-02-19 (×3): qty 2

## 2015-02-19 MED ORDER — CLINDAMYCIN PHOSPHATE 900 MG/50ML IV SOLN: 900.0000 mg | INTRAVENOUS | Status: DC

## 2015-02-19 MED ORDER — NEOMYCIN SULFATE 500 MG PO TABS
1000.0000 mg | ORAL_TABLET | Freq: Three times a day (TID) | ORAL | Status: AC
Start: 2015-02-19 — End: 2015-02-19
  Administered 2015-02-19 (×3): 1000 mg via ORAL
  Filled 2015-02-19 (×3): qty 2

## 2015-02-19 MED ORDER — GENTAMICIN SULFATE 40 MG/ML IJ SOLN
5.0000 mg/kg | INTRAVENOUS | Status: AC
  Administered 2015-02-20: 300 mg via INTRAVENOUS
  Filled 2015-02-19 (×2): qty 7.5

## 2015-02-19 MED ORDER — GENTAMICIN SULFATE 40 MG/ML IJ SOLN: 5.0000 mg/kg | INTRAVENOUS | Status: DC

## 2015-02-19 MED ORDER — KCL IN DEXTROSE-NACL 20-5-0.9 MEQ/L-%-% IV SOLN
INTRAVENOUS | Status: DC
  Administered 2015-02-19: 17:00:00 via INTRAVENOUS
  Administered 2015-02-20: 1 mL via INTRAVENOUS
  Administered 2015-02-20 – 2015-02-23 (×5): via INTRAVENOUS
  Filled 2015-02-19 (×13): qty 1000

## 2015-02-19 MED ORDER — POLYETHYLENE GLYCOL 3350 17 GM/SCOOP PO POWD
1.0000 | Freq: Once | ORAL | Status: AC
  Administered 2015-02-19: 255 g via ORAL
  Filled 2015-02-19: qty 255

## 2015-02-19 MED ORDER — METRONIDAZOLE IN NACL 5-0.79 MG/ML-% IV SOLN
500.0000 mg | Freq: Three times a day (TID) | INTRAVENOUS | Status: DC
Start: 2015-02-20 — End: 2015-02-24
  Administered 2015-02-20 – 2015-02-24 (×12): 500 mg via INTRAVENOUS
  Filled 2015-02-19 (×15): qty 100

## 2015-02-19 MED ORDER — ALVIMOPAN 12 MG PO CAPS
12.0000 mg | ORAL_CAPSULE | Freq: Once | ORAL | Status: AC
  Administered 2015-02-19: 12 mg via ORAL
  Filled 2015-02-19: qty 1

## 2015-02-19 MED ORDER — SODIUM CHLORIDE 0.9 % IV SOLN: INTRAVENOUS | Status: AC

## 2015-02-19 NOTE — Progress Notes (Signed)
Pt had an episode of vommiting this evening. Says she does not feel nauseous anymore at this time

## 2015-02-19 NOTE — Progress Notes (Signed)
Pt signed consent for procedure tomorrow. Wound care nurse will mark site first thing tomorrow morning before pt goes to OR. Prn iv zofran administered this evening for c/o nausea

## 2015-02-19 NOTE — Progress Notes (Signed)
Pt took and completed her bowel prep for surgery tomorrow

## 2015-02-19 NOTE — Progress Notes (Signed)
Prn tylenol 650mg  po administered for c/o a headache

## 2015-02-19 NOTE — Progress Notes (Signed)
4 Days Post-Op  Subjective: She put herself on clears yesterday, and is ready to start prep.  Objective: Vital signs in last 24 hours: Temp:  [98.2 F (36.8 C)-98.6 F (37 C)] 98.4 F (36.9 C) (11/27 0508) Pulse Rate:  [75-96] 78 (11/27 0508) Resp:  [16-19] 18 (11/27 0508) BP: (96-125)/(55-70) 96/61 mmHg (11/27 0508) SpO2:  [97 %-100 %] 97 % (11/27 0508) Last BM Date: 02/17/15 1680 PO 2850 urine,  Afebrile, VSS WBC is 12K this AM, Coags are normal CXR OK   Intake/Output from previous day: 11/26 0701 - 11/27 0700 In: 8560.8 [P.O.:1680; I.V.:4380.8; IV Piggyback:2500] Out: 2850 [Urine:2850] Intake/Output this shift:    General appearance: alert, cooperative and no distress GI: soft, non-tender; bowel sounds normal; no masses,  no organomegaly  Lab Results:   Recent Labs  02/19/15 0500  WBC 12.0*  HGB 11.4*  HCT 35.1*  PLT 387    BMET No results for input(s): NA, K, CL, CO2, GLUCOSE, BUN, CREATININE, CALCIUM in the last 72 hours. PT/INR  Recent Labs  02/19/15 0500  LABPROT 15.0  INR 1.16    No results for input(s): AST, ALT, ALKPHOS, BILITOT, PROT, ALBUMIN in the last 168 hours.   Lipase  No results found for: LIPASE   Studies/Results: Dg Chest 2 View  02/18/2015  CLINICAL DATA:  Preop for colon surgery EXAM: CHEST  2 VIEW COMPARISON:  None. FINDINGS: Cardiomediastinal silhouette is unremarkable. There is right arm PICC line with tip in SVC. No acute infiltrate or pleural effusion. No pulmonary edema. Bony thorax is unremarkable IMPRESSION: No active cardiopulmonary disease. Electronically Signed   By: Lahoma Crocker M.D.   On: 02/18/2015 16:56    Medications: . ciprofloxacin  400 mg Intravenous Q12H  . [START ON 02/20/2015] clindamycin (CLEOCIN) IV  900 mg Intravenous To SSTC   And  . [START ON 02/20/2015] gentamicin  5 mg/kg Intravenous To SSTC  . heparin  5,000 Units Subcutaneous 3 times per day  . metronidazole  500 mg Intravenous Q8H  .  saccharomyces boulardii  250 mg Oral BID  . 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  Pre ops are in.       LOS: 9 days    Haizlee Henton 02/19/2015

## 2015-02-19 NOTE — Consult Note (Signed)
WOC ostomy consult note Request for preop stoma marking came in after [place] had left this campus.  Communicated with [facility] PA Will Creig Hines and asked in marking early tomorrow am would be satisfactory and he states that it is.  Message sent to D. Barbie Haggis, [place] Nurse for marking request early tomorrow am. [place] nursing team will not follow, but will remain available to this patient, the nursing and medical teams.  Please re-consult if needed. Thanks, Maudie Flakes, MSN, RN, [place], Arther Abbott  Pager# [phone]

## 2015-02-20 ENCOUNTER — Inpatient Hospital Stay (HOSPITAL_COMMUNITY): Payer: BLUE CROSS/BLUE SHIELD | Admitting: Anesthesiology

## 2015-02-20 ENCOUNTER — Encounter (HOSPITAL_COMMUNITY): Admission: AD | Disposition: A | Discharge status: Home / Self Care | Payer: Self-pay | Source: Ambulatory Visit

## 2015-02-20 ENCOUNTER — Encounter (HOSPITAL_COMMUNITY): Payer: Self-pay | Admitting: Certified Registered"

## 2015-02-20 DIAGNOSIS — K6289 Other specified diseases of anus and rectum: Secondary | ICD-10-CM

## 2015-02-20 HISTORY — PX: APPENDECTOMY: SHX54

## 2015-02-20 HISTORY — PX: LAPAROTOMY: SHX154

## 2015-02-20 HISTORY — PX: LAPAROSCOPY ABDOMEN DIAGNOSTIC: PRO50

## 2015-02-20 HISTORY — DX: Other specified diseases of anus and rectum: K62.89

## 2015-02-20 HISTORY — PX: COLOSTOMY: SHX63

## 2015-02-20 HISTORY — PX: EXPLORATORY LAPAROTOMY: SUR591

## 2015-02-20 HISTORY — PX: LOW ANTERIOR BOWEL RESECTION: SUR1240

## 2015-02-20 HISTORY — PX: LAPAROSCOPY: SHX197

## 2015-02-20 LAB — BASIC METABOLIC PANEL
ANION GAP: 7 (ref 5–15)
BUN: <5 mg/dL — ABNORMAL LOW (ref 6–20)
CHLORIDE: 106 mmol/L (ref 101–111)
CO2: 25 mmol/L (ref 22–32)
Calcium: 8.5 mg/dL — ABNORMAL LOW (ref 8.9–10.3)
Creatinine, Ser: 0.57 mg/dL (ref 0.44–1.00)
GFR calc non Af Amer: >60 mL/min (ref >60)
Glucose, Bld: 103 mg/dL — ABNORMAL HIGH (ref 65–99)
Potassium: 4.2 mmol/L (ref 3.5–5.1)
SODIUM: 138 mmol/L (ref 135–145)

## 2015-02-20 LAB — HEMOGLOBIN A1C
HEMOGLOBIN A1C: 5.6 % (ref 4.8–5.6)
MEAN PLASMA GLUCOSE: 114 mg/dL

## 2015-02-20 LAB — CBC
HEMATOCRIT: 34.6 % — AB (ref 36.0–46.0)
HEMOGLOBIN: 11.2 g/dL — AB (ref 12.0–15.0)
MCH: 25.6 pg — ABNORMAL LOW (ref 26.0–34.0)
MCHC: 32.4 g/dL (ref 30.0–36.0)
MCV: 79.2 fL (ref 78.0–100.0)
Platelets: 430 10*3/uL — ABNORMAL HIGH (ref 150–400)
RBC: 4.37 MIL/uL (ref 3.87–5.11)
RDW: 16.6 % — AB (ref 11.5–15.5)
WBC: 13.6 10*3/uL — AB (ref 4.0–10.5)

## 2015-02-20 SURGERY — LAPAROSCOPY, DIAGNOSTIC
Anesthesia: General | Site: Abdomen

## 2015-02-20 MED ORDER — MORPHINE SULFATE 2 MG/ML IV SOLN
INTRAVENOUS | Status: AC
  Administered 2015-02-20: 2 mg
  Filled 2015-02-20: qty 25

## 2015-02-20 MED ORDER — FENTANYL CITRATE (PF) 250 MCG/5ML IJ SOLN
INTRAMUSCULAR | Status: AC
  Filled 2015-02-20: qty 5

## 2015-02-20 MED ORDER — ALBUMIN HUMAN 5 % IV SOLN
INTRAVENOUS | Status: DC | PRN
  Administered 2015-02-20 (×2): via INTRAVENOUS

## 2015-02-20 MED ORDER — ROCURONIUM BROMIDE 100 MG/10ML IV SOLN
INTRAVENOUS | Status: DC | PRN
  Administered 2015-02-20: 50 mg via INTRAVENOUS

## 2015-02-20 MED ORDER — SUCCINYLCHOLINE CHLORIDE 20 MG/ML IJ SOLN
INTRAMUSCULAR | Status: AC
  Filled 2015-02-20: qty 1

## 2015-02-20 MED ORDER — BUPIVACAINE HCL (PF) 0.25 % IJ SOLN
INTRAMUSCULAR | Status: AC
  Filled 2015-02-20: qty 30

## 2015-02-20 MED ORDER — PROPOFOL 10 MG/ML IV BOLUS
INTRAVENOUS | Status: DC | PRN
  Administered 2015-02-20: 150 mg via INTRAVENOUS

## 2015-02-20 MED ORDER — NALOXONE HCL 0.4 MG/ML IJ SOLN: 0.4000 mg | INTRAMUSCULAR | Status: DC | PRN

## 2015-02-20 MED ORDER — HEPARIN SODIUM (PORCINE) 5000 UNIT/ML IJ SOLN
5000.0000 [IU] | Freq: Three times a day (TID) | INTRAMUSCULAR | Status: DC
  Administered 2015-02-21 – 2015-02-24 (×8): 5000 [IU] via SUBCUTANEOUS
  Filled 2015-02-20 (×8): qty 1

## 2015-02-20 MED ORDER — PHENYLEPHRINE 40 MCG/ML (10ML) SYRINGE FOR IV PUSH (FOR BLOOD PRESSURE SUPPORT)
PREFILLED_SYRINGE | INTRAVENOUS | Status: AC
  Filled 2015-02-20: qty 10

## 2015-02-20 MED ORDER — HYDROMORPHONE HCL 1 MG/ML IJ SOLN
INTRAMUSCULAR | Status: AC
  Filled 2015-02-20: qty 1

## 2015-02-20 MED ORDER — SODIUM CHLORIDE 0.9 % IJ SOLN: 9.0000 mL | INTRAMUSCULAR | Status: DC | PRN

## 2015-02-20 MED ORDER — PROMETHAZINE HCL 25 MG/ML IJ SOLN
6.2500 mg | INTRAMUSCULAR | Status: DC | PRN
  Administered 2015-02-20: 6.25 mg via INTRAVENOUS

## 2015-02-20 MED ORDER — PROPOFOL 10 MG/ML IV BOLUS
INTRAVENOUS | Status: AC
  Filled 2015-02-20: qty 20

## 2015-02-20 MED ORDER — VECURONIUM BROMIDE 10 MG IV SOLR
INTRAVENOUS | Status: DC | PRN
  Administered 2015-02-20 (×2): 1 mg via INTRAVENOUS

## 2015-02-20 MED ORDER — ONDANSETRON HCL 4 MG/2ML IJ SOLN
INTRAMUSCULAR | Status: AC
  Filled 2015-02-20: qty 2

## 2015-02-20 MED ORDER — PHENYLEPHRINE HCL 10 MG/ML IJ SOLN
INTRAMUSCULAR | Status: DC | PRN
  Administered 2015-02-20 (×8): 80 ug via INTRAVENOUS

## 2015-02-20 MED ORDER — PROMETHAZINE HCL 25 MG/ML IJ SOLN
INTRAMUSCULAR | Status: AC
  Filled 2015-02-20: qty 1

## 2015-02-20 MED ORDER — MIDAZOLAM HCL 5 MG/5ML IJ SOLN
INTRAMUSCULAR | Status: DC | PRN
  Administered 2015-02-20: 2 mg via INTRAVENOUS

## 2015-02-20 MED ORDER — MORPHINE SULFATE 2 MG/ML IV SOLN
INTRAVENOUS | Status: DC
Start: 2015-02-20 — End: 2015-02-21
  Administered 2015-02-20 – 2015-02-21 (×2): 4 mg via INTRAVENOUS
  Administered 2015-02-21: 6 mg via INTRAVENOUS
  Administered 2015-02-21: 9 mg via INTRAVENOUS

## 2015-02-20 MED ORDER — EPHEDRINE SULFATE 50 MG/ML IJ SOLN
INTRAMUSCULAR | Status: AC
  Filled 2015-02-20: qty 1

## 2015-02-20 MED ORDER — HYDROMORPHONE HCL 1 MG/ML IJ SOLN: 0.2500 mg | INTRAMUSCULAR | Status: DC | PRN

## 2015-02-20 MED ORDER — BUPIVACAINE HCL 0.25 % IJ SOLN
INTRAMUSCULAR | Status: DC | PRN
  Administered 2015-02-20: 3 mL

## 2015-02-20 MED ORDER — GLYCOPYRROLATE 0.2 MG/ML IJ SOLN
INTRAMUSCULAR | Status: AC
  Filled 2015-02-20: qty 2

## 2015-02-20 MED ORDER — DIPHENHYDRAMINE HCL 50 MG/ML IJ SOLN: 12.5000 mg | Freq: Four times a day (QID) | INTRAMUSCULAR | Status: DC | PRN

## 2015-02-20 MED ORDER — ONDANSETRON HCL 4 MG/2ML IJ SOLN
INTRAMUSCULAR | Status: DC | PRN
  Administered 2015-02-20: 4 mg via INTRAVENOUS

## 2015-02-20 MED ORDER — FLUTICASONE PROPIONATE 50 MCG/ACT NA SUSP
2.0000 | Freq: Every day | NASAL | Status: DC
  Administered 2015-02-21 – 2015-02-24 (×4): 2 via NASAL
  Filled 2015-02-20: qty 16

## 2015-02-20 MED ORDER — ARTIFICIAL TEARS OP OINT
TOPICAL_OINTMENT | OPHTHALMIC | Status: AC
  Filled 2015-02-20: qty 3.5

## 2015-02-20 MED ORDER — NEOSTIGMINE METHYLSULFATE 10 MG/10ML IV SOLN
INTRAVENOUS | Status: AC
  Filled 2015-02-20: qty 1

## 2015-02-20 MED ORDER — FENTANYL CITRATE (PF) 100 MCG/2ML IJ SOLN
INTRAMUSCULAR | Status: DC | PRN
  Administered 2015-02-20: 100 ug via INTRAVENOUS
  Administered 2015-02-20: 50 ug via INTRAVENOUS
  Administered 2015-02-20 (×2): 100 ug via INTRAVENOUS
  Administered 2015-02-20: 50 ug via INTRAVENOUS

## 2015-02-20 MED ORDER — ACETAMINOPHEN 10 MG/ML IV SOLN
1000.0000 mg | Freq: Four times a day (QID) | INTRAVENOUS | Status: DC
  Administered 2015-02-20 – 2015-02-21 (×3): 1000 mg via INTRAVENOUS
  Filled 2015-02-20 (×3): qty 100

## 2015-02-20 MED ORDER — LIDOCAINE HCL (CARDIAC) 20 MG/ML IV SOLN
INTRAVENOUS | Status: DC | PRN
  Administered 2015-02-20: 80 mg via INTRAVENOUS

## 2015-02-20 MED ORDER — ROCURONIUM BROMIDE 50 MG/5ML IV SOLN
INTRAVENOUS | Status: AC
  Filled 2015-02-20: qty 1

## 2015-02-20 MED ORDER — GLYCOPYRROLATE 0.2 MG/ML IJ SOLN
INTRAMUSCULAR | Status: DC | PRN
  Administered 2015-02-20: 0.4 mg via INTRAVENOUS

## 2015-02-20 MED ORDER — HYDROMORPHONE HCL 1 MG/ML IJ SOLN
0.2500 mg | INTRAMUSCULAR | Status: DC | PRN
  Administered 2015-02-20: 0.5 mg via INTRAVENOUS

## 2015-02-20 MED ORDER — VECURONIUM BROMIDE 10 MG IV SOLR
INTRAVENOUS | Status: AC
  Filled 2015-02-20: qty 10

## 2015-02-20 MED ORDER — LIDOCAINE HCL (CARDIAC) 20 MG/ML IV SOLN
INTRAVENOUS | Status: AC
  Filled 2015-02-20: qty 5

## 2015-02-20 MED ORDER — PROMETHAZINE HCL 25 MG/ML IJ SOLN: 6.2500 mg | INTRAMUSCULAR | Status: DC | PRN

## 2015-02-20 MED ORDER — LACTATED RINGERS IV SOLN
INTRAVENOUS | Status: DC | PRN
  Administered 2015-02-20 (×3): via INTRAVENOUS

## 2015-02-20 MED ORDER — SODIUM CHLORIDE 0.9 % IJ SOLN
INTRAMUSCULAR | Status: AC
  Filled 2015-02-20: qty 10

## 2015-02-20 MED ORDER — 0.9 % SODIUM CHLORIDE (POUR BTL) OPTIME
TOPICAL | Status: DC | PRN
  Administered 2015-02-20: 1000 mL

## 2015-02-20 MED ORDER — DIPHENHYDRAMINE HCL 12.5 MG/5ML PO ELIX: 12.5000 mg | ORAL_SOLUTION | Freq: Four times a day (QID) | ORAL | Status: DC | PRN

## 2015-02-20 MED ORDER — MIDAZOLAM HCL 2 MG/2ML IJ SOLN
INTRAMUSCULAR | Status: AC
  Filled 2015-02-20: qty 2

## 2015-02-20 MED ORDER — METHOCARBAMOL 1000 MG/10ML IJ SOLN
1000.0000 mg | Freq: Three times a day (TID) | INTRAVENOUS | Status: DC | PRN
  Filled 2015-02-20 (×2): qty 10

## 2015-02-20 MED ORDER — NEOSTIGMINE METHYLSULFATE 10 MG/10ML IV SOLN
INTRAVENOUS | Status: DC | PRN
  Administered 2015-02-20: 3 mg via INTRAVENOUS

## 2015-02-20 SURGICAL SUPPLY — 70 items
BLADE SURG 10 STRL SS (BLADE) ×3 IMPLANT
BLADE SURG ROTATE 9660 (MISCELLANEOUS) IMPLANT
CANISTER SUCTION 2500CC (MISCELLANEOUS) ×3 IMPLANT
CHLORAPREP W/TINT 26ML (MISCELLANEOUS) ×3 IMPLANT
COVER MAYO STAND STRL (DRAPES) ×3 IMPLANT
COVER SURGICAL LIGHT HANDLE (MISCELLANEOUS) ×3 IMPLANT
DRAPE LAPAROSCOPIC ABDOMINAL (DRAPES) ×6 IMPLANT
DRAPE PROXIMA HALF (DRAPES) ×3 IMPLANT
DRAPE UTILITY XL STRL (DRAPES) ×6 IMPLANT
DRAPE WARM FLUID 44X44 (DRAPE) IMPLANT
DRSG OPSITE POSTOP 4X10 (GAUZE/BANDAGES/DRESSINGS) ×3 IMPLANT
DRSG OPSITE POSTOP 4X8 (GAUZE/BANDAGES/DRESSINGS) IMPLANT
ELECT BLADE 6.5 EXT (BLADE) ×3 IMPLANT
ELECT CAUTERY BLADE 6.4 (BLADE) ×6 IMPLANT
ELECT REM PT RETURN 9FT ADLT (ELECTROSURGICAL) ×3
ELECTRODE REM PT RTRN 9FT ADLT (ELECTROSURGICAL) ×2 IMPLANT
GLOVE BIO SURGEON STRL SZ 6.5 (GLOVE) ×3 IMPLANT
GLOVE BIO SURGEON STRL SZ7 (GLOVE) ×3 IMPLANT
GLOVE BIO SURGEON STRL SZ7.5 (GLOVE) ×6 IMPLANT
GLOVE BIOGEL PI IND STRL 6.5 (GLOVE) ×6 IMPLANT
GLOVE BIOGEL PI IND STRL 7.0 (GLOVE) ×6 IMPLANT
GLOVE BIOGEL PI IND STRL 8 (GLOVE) ×4 IMPLANT
GLOVE BIOGEL PI INDICATOR 6.5 (GLOVE) ×3
GLOVE BIOGEL PI INDICATOR 7.0 (GLOVE) ×3
GLOVE BIOGEL PI INDICATOR 8 (GLOVE) ×2
GOWN STRL REUS W/ TWL LRG LVL3 (GOWN DISPOSABLE) ×4 IMPLANT
GOWN STRL REUS W/ TWL XL LVL3 (GOWN DISPOSABLE) ×2 IMPLANT
GOWN STRL REUS W/TWL LRG LVL3 (GOWN DISPOSABLE) ×2
GOWN STRL REUS W/TWL XL LVL3 (GOWN DISPOSABLE) ×1
KIT BASIN OR (CUSTOM PROCEDURE TRAY) ×3 IMPLANT
KIT ROOM TURNOVER OR (KITS) ×3 IMPLANT
LEGGING LITHOTOMY PAIR STRL (DRAPES) ×3 IMPLANT
LIGASURE IMPACT 36 18CM CVD LR (INSTRUMENTS) ×3 IMPLANT
LIQUID BAND (GAUZE/BANDAGES/DRESSINGS) ×3 IMPLANT
NEEDLE INSUFFLATION 14GA 120MM (NEEDLE) ×3 IMPLANT
NS IRRIG 1000ML POUR BTL (IV SOLUTION) ×3 IMPLANT
PACK GENERAL/GYN (CUSTOM PROCEDURE TRAY) ×3 IMPLANT
PAD ARMBOARD 7.5X6 YLW CONV (MISCELLANEOUS) ×6 IMPLANT
PENCIL BUTTON HOLSTER BLD 10FT (ELECTRODE) ×3 IMPLANT
SCISSORS LAP 5X35 DISP (ENDOMECHANICALS) ×3 IMPLANT
SET IRRIG TUBING LAPAROSCOPIC (IRRIGATION / IRRIGATOR) ×3 IMPLANT
SHEARS HARMONIC ACE PLUS 36CM (ENDOMECHANICALS) ×3 IMPLANT
SLEEVE ENDOPATH XCEL 5M (ENDOMECHANICALS) ×9 IMPLANT
SPECIMEN JAR LARGE (MISCELLANEOUS) IMPLANT
SPONGE LAP 18X18 X RAY DECT (DISPOSABLE) ×9 IMPLANT
STAPLER CUT CVD 40MM BLUE (STAPLE) ×3 IMPLANT
STAPLER PROXIMATE 75MM BLUE (STAPLE) ×3 IMPLANT
STAPLER VISISTAT 35W (STAPLE) ×6 IMPLANT
SUCTION POOLE TIP (SUCTIONS) ×3 IMPLANT
SUT MNCRL AB 4-0 PS2 18 (SUTURE) ×3 IMPLANT
SUT PDS AB 1 TP1 96 (SUTURE) ×6 IMPLANT
SUT PDS AB 2-0 CT1 27 (SUTURE) ×6 IMPLANT
SUT PROLENE 0 CT (SUTURE) ×3 IMPLANT
SUT SILK 2 0 SH CR/8 (SUTURE) ×3 IMPLANT
SUT SILK 2 0 TIES 10X30 (SUTURE) ×3 IMPLANT
SUT SILK 3 0 SH CR/8 (SUTURE) ×3 IMPLANT
SUT SILK 3 0 TIES 10X30 (SUTURE) ×3 IMPLANT
SUT VIC AB 3-0 SH 8-18 (SUTURE) ×3 IMPLANT
TOWEL OR 17X24 6PK STRL BLUE (TOWEL DISPOSABLE) ×3 IMPLANT
TOWEL OR 17X26 10 PK STRL BLUE (TOWEL DISPOSABLE) ×6 IMPLANT
TRAY FOLEY CATH 16FRSI W/METER (SET/KITS/TRAYS/PACK) IMPLANT
TRAY LAPAROSCOPIC MC (CUSTOM PROCEDURE TRAY) ×3 IMPLANT
TROCAR BLADELESS 5MM (ENDOMECHANICALS) ×3 IMPLANT
TROCAR XCEL 12X100 BLDLESS (ENDOMECHANICALS) IMPLANT
TROCAR XCEL BLUNT TIP 100MML (ENDOMECHANICALS) IMPLANT
TROCAR XCEL NON-BLD 11X100MML (ENDOMECHANICALS) IMPLANT
TROCAR XCEL NON-BLD 5MMX100MML (ENDOMECHANICALS) ×3 IMPLANT
TUBE CONNECTING 12X1/4 (SUCTIONS) ×3 IMPLANT
TUBING INSUFFLATION (TUBING) ×3 IMPLANT
YANKAUER SUCT BULB TIP NO VENT (SUCTIONS) ×3 IMPLANT

## 2015-02-20 NOTE — Consult Note (Addendum)
WOC consult requested for stoma site marking for possible ostomy during surgery today.  Pt already in presurgical holding area during marking with limited ability for repositioning.  Assessed abd while sitting upright and lying down.  Mark placed within rectus muscles, in line of vision, to area free from folds.  Mark located 6 cm to the left of the umbilicus, 2.5 cm below and also 6 cm to the right of the umbilicus, 2.5 cm below.  Areas below these markings should be avoided if possible related to skin folds.  Demonstrated pouch appearance and educational materials given to patient's family.  If ostomy is placed, [place] team will plan to follow after surgery for further educational sessions. Pt denies further questions at this time. Please re-consult if further assistance is needed.  Thank-you,  Julien Girt MSN, [place], [place], [place], [place]

## 2015-02-20 NOTE — Anesthesia Preprocedure Evaluation (Addendum)
Anesthesia Evaluation  Patient identified by MRN, date of birth, ID band Patient awake    Reviewed: Allergy & Precautions, NPO status , Patient's Chart, lab work & pertinent test results, Unable to perform ROS - Chart review only  History of Anesthesia Complications Negative for: history of anesthetic complications  Airway Mallampati: I  TM Distance: >3 FB Neck ROM: Full    Dental no notable dental hx. (+) Teeth Intact, Dental Advisory Given   Pulmonary neg pulmonary ROS,    Pulmonary exam normal breath sounds clear to auscultation       Cardiovascular negative cardio ROS Normal cardiovascular exam Rhythm:Regular Rate:Normal     Neuro/Psych negative neurological ROS     GI/Hepatic As per surgery    Endo/Other  negative endocrine ROS  Renal/GU negative Renal ROS     Musculoskeletal   Abdominal   Peds  Hematology  (+) anemia ,   Anesthesia Other Findings   Reproductive/Obstetrics                           Anesthesia Physical Anesthesia Plan  ASA: II  Anesthesia Plan: General   Post-op Pain Management:    Induction: Intravenous  Airway Management Planned: Oral ETT  Additional Equipment:   Intra-op Plan:   Post-operative Plan: Extubation in OR  Informed Consent: I have reviewed the patients History and Physical, chart, labs and discussed the procedure including the risks, benefits and alternatives for the proposed anesthesia with the patient or authorized representative who has indicated his/her understanding and acceptance.   Dental advisory given  Plan Discussed with: CRNA, Surgeon and Anesthesiologist  Anesthesia Plan Comments:       Anesthesia Quick Evaluation

## 2015-02-20 NOTE — Progress Notes (Signed)
Pt for sigmoid colectomy today with consent NPO midnight, given ativan last night, CHG applied as ordered, husband at the bedside, no s/s of distress noted.

## 2015-02-20 NOTE — Anesthesia Procedure Notes (Signed)
Procedure Name: Intubation Date/Time: 02/20/2015 8:05 AM Performed by: Manuela Schwartz B Pre-anesthesia Checklist: Patient identified, Emergency Drugs available, Suction available, Patient being monitored and Timeout performed Patient Re-evaluated:Patient Re-evaluated prior to inductionOxygen Delivery Method: Circle system utilized Preoxygenation: Pre-oxygenation with 100% oxygen Intubation Type: IV induction Ventilation: Mask ventilation without difficulty Laryngoscope Size: Mac and 3 Grade View: Grade I Tube type: Oral Tube size: 7.5 mm Number of attempts: 1 Airway Equipment and Method: Stylet Placement Confirmation: ETT inserted through vocal cords under direct vision,  positive ETCO2 and breath sounds checked- equal and bilateral Secured at: 21 cm Tube secured with: Tape Dental Injury: Teeth and Oropharynx as per pre-operative assessment

## 2015-02-20 NOTE — Progress Notes (Signed)
Pt transfer to OR alert and oriented with husband at bedside, no s/s of distress noted.

## 2015-02-20 NOTE — Care Management Note (Signed)
Case Management Note  Patient Details  Name: Carla Mills MRN: [id number] Date of Birth: Feb 15, 1971  Subjective/Objective:                    Action/Plan:  Will need order for home health RN for colostomy teaching / care  Expected Discharge Date:                  Expected Discharge Plan:  [place]  In-House Referral:     Discharge planning Services  CM Consult  Post Acute Care Choice:    Choice offered to:     DME Arranged:    DME Agency:     HH Arranged:    [place] Agency:     Status of Service:  In process, will continue to follow  Medicare Important Message Given:    Date Medicare IM Given:    Medicare IM give by:    Date Additional Medicare IM Given:    Additional Medicare Important Message give by:     If discussed at [place] of Stay Meetings, dates discussed:    Additional Comments:  Marilu Favre, RN 02/20/2015, 3:01 PM

## 2015-02-20 NOTE — Op Note (Addendum)
02/20/2015  10:51 AM  PATIENT:  Carla Mills  44 y.o. female  PRE-OPERATIVE DIAGNOSIS:  colon mass  POST-OPERATIVE DIAGNOSIS:  Sigmoid and rectal mass   PROCEDURE:  Procedure(s) Rigid sigmoidoscopy LAPAROSCOPY DIAGNOSTIC (N/A) Low Anterior Resection with Hartman's Pouch(N/A) COLOSTOMY (N/A) Addendum: Appendectomy  SURGEON:  Surgeon(s) and Role:    * [name], MD - Primary  ANESTHESIA:   local and general  EBL: 100cc Total I/O In: 1500 [I.V.:1000; IV Piggyback:500] Out: 450 [Urine:450]  BLOOD ADMINISTERED:none  DRAINS: none   LOCAL MEDICATIONS USED:  BUPIVICAINE   SPECIMEN:  Source of Specimen:  Sigmoid rectum, appendix  DISPOSITION OF SPECIMEN:  PATHOLOGY  COUNTS:  YES  TOURNIQUET:  * No tourniquets in log *  DICTATION: .Dragon Dictation  This patient was consented she was taken back to the OR and placed in the supine position with bilateral SCDs in place. Patient underwent general endotracheal anesthesia. Patient on Foley catheter placed. She was placed in lithotomy position. Patient prepped and draped in the usual sterile fashion. A timeout was called all facts were verified.  A rigid sigmoidoscopy was used to examine the proximal rectum. There was poor prep and a lot of stool. There is no mass effect could be seen secondary to this.  The patient was draped in usual sterile fashion. Veress needle technique was used to insufflate the abdomen to 15 mmHg in the right costal margin. Subsequent to this a 5 mm trocar and camera were then placed into the abdomen. There was no injury to any intra-abdominal organs. A 5 mm working port was placed just inferior the umbilicus in the midline into the right lower quadrant under direct visualization. At this time the patient was positioned in Trendelenburg position. The small bowel was in position cephalad. At this time he could see a large amount of adhesions to the posterior wall of the uterus. There is a large  inflammatory mass which consisted of the colon, small bowel just posterior to the uterus. These adhesions in between the small bowel, colon, very dense. Secondary to the dense adhesions and proceeded to convert to an open laparotomy.  A #10 blade was used to make a lower midline incision around the umbilicus. Cautery was used to maintain hemostasis and dissection was taken down to the midline fascia. The patient was incised to the length of the incision. At this time I could palpate a large inflammatory mass of the colon to the posterior descending uterus. The adhesions were broken with finger fracture. It appeared that a piece of jejunum was laid on top of the liver appeared to be perforated sigmoid colon. The appendix was also involved, and appeared bruised.  At this time proceeded to incise the white line of Toldt laterally of the proximal sigmoid colon. Was able to medialize the sigmoid colon. The left ureter was identified and protected all portions of the case. At this time an area of healthy left colon proximal to the inflammatory mass, approximately 8-10 cm was chosen and divided. This was done with a 75 GIA stapler. The proceeded to harvest the mesentery of the sigmoid colon down to the base. The IMA vessels were suture ligated. I proceeded to harvest the rectal mesentery. The sigmoid colon did appear to be perforated as there was stool and purulence. This appeared to be contained. I proceeded to incise the peritoneum inferiorly. A dense mass of the midportion of the rectum was palpated, this was adherent to the left lateral pelvic wall. I proceeded to dissect  distally on the rectum. Once it was approximately 3-4 cm distal to this area the peritoneum was incised. The distal portion of the rectum was cleaned away from the surrounding fatty tissue. A blue contour was then used to transect the distal rectum. This was then removed and sent to pathology. A stitch was placed in the distal portion of the  specimen.  A 0 Prolene was used to mark the staple line of the distal rectum. Secondary to the inflammation , and dilation of the proximal colon I decided to place a colostomy. At this time the pelvis was irrigated out with multiple liters of sterile saline. I decided to palpate the liver there were some superficial palpable masses however these appeared to be hemangioma-like in nature. There was no other signs of any metastasis seen or palpated.  At this time the ostomy site was marked the skin was excised. The subcutaneous tissue was excised. The rectus sheath was incised in a cruciate fashion. The peritoneum was incised in a similar fashion. This allowed approximately 2.5 fingerbreaths. The colon was brought up through this ostomy site. At this time the midline wound was reapproximated using a 2-0 PDS in a standard running fashion 2. Skin was then stapled close the trocar sites. The midline wound was covered. The staple line of the colostomy was excised. The colostomy was matured using 3-0 Vicryl's in interrupted fashion.  The midline wound and trocar sites were then covered with honeycomb dressing. The patient tied the procedure well was taken to the recovery room in stable condition.  Addendum: Secondary to the fact that the tip of the appendix was bruised. I proceeded to excise the appendix. The LigaSure device was used to ligate the mesoappendix. A 2-0 silk was used to create a pursestring stitch. The appendix was excised and the base of the appendix. This end was then dunked into the cecum., And the pursestring was sutured down. 3-0 silk's were used in a Lembert fashion over the appendiceal stump.    PLAN OF CARE: admitted as an inpatient  PATIENT DISPOSITION:  PACU - hemodynamically stable.   Delay start of Pharmacological VTE agent (>24hrs) due to surgical blood loss or risk of bleeding: yes

## 2015-02-20 NOTE — Progress Notes (Signed)
Pt antibiotic gentamycin and clindamycin  to be given to OR prior to surgery.

## 2015-02-20 NOTE — Transfer of Care (Signed)
Immediate Anesthesia Transfer of Care Note  Patient: Carla Mills  Procedure(s) Performed: Procedure(s): LAPAROSCOPY DIAGNOSTIC (N/A) EXPLORATORY LAPAROTOMY AND PARTIAL COLECTOMY (N/A) COLOSTOMY (N/A)  Patient Location: PACU  Anesthesia Type:General  Level of Consciousness: awake, alert  and oriented  Airway & Oxygen Therapy: Patient Spontanous Breathing and Patient connected to nasal cannula oxygen  Post-op Assessment: Report given to RN and Post -op Vital signs reviewed and stable  Post vital signs: Reviewed and stable  Last Vitals:  Filed Vitals:   02/19/15 2154 02/20/15 0529  BP:  102/56  Pulse:  90  Temp: 37.7 C 37 C  Resp:  16    Complications: No apparent anesthesia complications

## 2015-02-21 ENCOUNTER — Encounter (HOSPITAL_COMMUNITY): Payer: Self-pay | Admitting: General Surgery

## 2015-02-21 MED ORDER — SODIUM CHLORIDE 0.9 % IJ SOLN: 9.0000 mL | INTRAMUSCULAR | Status: DC | PRN

## 2015-02-21 MED ORDER — DIPHENHYDRAMINE HCL 50 MG/ML IJ SOLN: 12.5000 mg | Freq: Four times a day (QID) | INTRAMUSCULAR | Status: DC | PRN

## 2015-02-21 MED ORDER — DIPHENHYDRAMINE HCL 12.5 MG/5ML PO ELIX: 12.5000 mg | ORAL_SOLUTION | Freq: Four times a day (QID) | ORAL | Status: DC | PRN

## 2015-02-21 MED ORDER — ACETAMINOPHEN 10 MG/ML IV SOLN: 1000.0000 mg | Freq: Four times a day (QID) | INTRAVENOUS | Status: DC

## 2015-02-21 MED ORDER — ACETAMINOPHEN 10 MG/ML IV SOLN
1000.0000 mg | Freq: Four times a day (QID) | INTRAVENOUS | Status: AC
  Administered 2015-02-21 – 2015-02-22 (×4): 1000 mg via INTRAVENOUS
  Filled 2015-02-21 (×4): qty 100

## 2015-02-21 MED ORDER — ONDANSETRON HCL 4 MG/2ML IJ SOLN
4.0000 mg | Freq: Four times a day (QID) | INTRAMUSCULAR | Status: DC | PRN
  Administered 2015-02-21 (×3): 4 mg via INTRAVENOUS
  Filled 2015-02-21 (×2): qty 2

## 2015-02-21 MED ORDER — NALOXONE HCL 0.4 MG/ML IJ SOLN
0.4000 mg | INTRAMUSCULAR | Status: DC | PRN
Start: 2015-02-21 — End: 2015-02-22

## 2015-02-21 MED ORDER — MORPHINE SULFATE 2 MG/ML IV SOLN
INTRAVENOUS | Status: DC
  Administered 2015-02-21 (×2): 0 mg via INTRAVENOUS
  Administered 2015-02-21 – 2015-02-22 (×3): 1 mg via INTRAVENOUS

## 2015-02-21 MED ORDER — SODIUM CHLORIDE 0.9 % IV BOLUS (SEPSIS)
500.0000 mL | Freq: Once | INTRAVENOUS | Status: AC
  Administered 2015-02-21: 500 mL via INTRAVENOUS

## 2015-02-21 NOTE — Progress Notes (Signed)
1 Day Post-Op  Subjective: She is doing fairly well.  Mostly bloody drainage from the ostomy site.  Ostomy looks fine.  She is having pain, and Is Ok with upping PCA to full dose.  Midline incision looks ok, some drainage, but not that much.    Objective: Vital signs in last 24 hours: Temp:  [97.3 F (36.3 C)-100.5 F (38.1 C)] 98.7 F (37.1 C) (11/29 0400) Pulse Rate:  [71-101] 88 (11/29 0400) Resp:  [17-25] 21 (11/29 0400) BP: (86-158)/(51-86) 99/61 mmHg (11/29 0400) SpO2:  [96 %-100 %] 99 % (11/29 0400) Last BM Date: 02/17/15 2550 urine Afebrile, BP down some thru the PM, Sats good on 2 liter [place] WBC up some, H/H is stable Intake/Output from previous day: 11/28 0701 - 11/29 0700 In: 5130.8 [I.V.:4230.8; IV Piggyback:900] Out: 2560 [Urine:2550; Emesis/NG output:10] Intake/Output this shift:    Resp: clear to auscultation bilaterally GI: No Bowel sounds, very tender, still having a fair amount of pain.  ostomy looks good.  Midline wound OK some drainage.  What is in the ostomy bag now is serous, bloody stuff, some clot.  Lab Results:   Recent Labs  02/19/15 0500 02/20/15 0504  WBC 12.0* 13.6*  HGB 11.4* 11.2*  HCT 35.1* 34.6*  PLT 387 430*    BMET  Recent Labs  02/20/15 0504  NA 138  K 4.2  CL 106  CO2 25  GLUCOSE 103*  BUN <5*  CREATININE 0.57  CALCIUM 8.5*   PT/INR  Recent Labs  02/19/15 0500  LABPROT 15.0  INR 1.16    No results for input(s): AST, ALT, ALKPHOS, BILITOT, PROT, ALBUMIN in the last 168 hours.   Lipase  No results found for: LIPASE   Studies/Results: No results found.  Medications: . acetaminophen  1,000 mg Intravenous 4 times per day  . ciprofloxacin  400 mg Intravenous Q12H  . fluticasone  2 spray Each Nare Daily  . heparin  5,000 Units Subcutaneous 3 times per day  . metronidazole  500 mg Intravenous Q8H  . morphine   Intravenous 6 times per day  . sodium chloride  10-40 mL Intracatheter Q12H   . dextrose 5 % and 0.9 %  NaCl with KCl 20 mEq/L 125 mL/hr at 02/21/15 0002    Assessment/Plan Sigmoid and rectal mass with perforation Rigid sigmoidoscopy, LAPAROSCOPY DIAGNOSTIC, Low Anterior Resection with Hartman's, Pouch, COLOSTOMY,Appendectomy,  02/20/15, Dr. [name] Acute on chronic constipation Weight loss on diet Liver lesions or uncertain significance Anemia Antibiotics: Day 12 Cipro/Flagyl DVT: Heparin/SCD  Plan:  Continue PCA at full strength, continue IV tylenol, OOB to chair today and in room.  I will give her some extra fluid today also.     LOS: 11 days    Rosine Solecki 02/21/2015

## 2015-02-21 NOTE — Anesthesia Postprocedure Evaluation (Signed)
Anesthesia Post Note  Patient: Preeya Koelbl  Procedure(s) Performed: Procedure(s) (LRB): LAPAROSCOPY DIAGNOSTIC (N/A) EXPLORATORY LAPAROTOMY AND PARTIAL COLECTOMY (N/A) COLOSTOMY (N/A)  Patient location during evaluation: PACU Anesthesia Type: General Level of consciousness: awake and alert Pain management: pain level controlled Vital Signs Assessment: post-procedure vital signs reviewed and stable Respiratory status: spontaneous breathing, nonlabored ventilation, respiratory function stable and patient connected to nasal cannula oxygen Cardiovascular status: blood pressure returned to baseline and stable Postop Assessment: no signs of nausea or vomiting Anesthetic complications: no    Last Vitals:  Filed Vitals:   02/21/15 1112 02/21/15 1150  BP:  96/53  Pulse:  87  Temp:  36.7 C  Resp: 20 21    Last Pain:  Filed Vitals:   02/21/15 1205  PainSc: Asleep                 Adyan Palau S

## 2015-02-21 NOTE — Consult Note (Signed)
WOC ostomy follow up Stoma type/location: Pt had surgery for colostomy to LLQ yesterday. Stomal assessment/size: Stoma red and viable when visualized through pouch, which is intact with good seal. Output: Small amt blood-tinged drainage in pouch  Ostomy pouching: 2pc.  Education provided: Pt is nauseated and in pain, not ready for a teaching session.  Will begin when she is feeling better.  Supplies left at the bedside. Enrolled patient in [place] Start Discharge program: No Julien Girt MSN, [place], [place], [place]

## 2015-02-21 NOTE — Progress Notes (Signed)
1 Day Post-Op  Subjective: She is doing fairly well today. Abdominal pain not well controlled with current dose of pain medication. Also feeling intermittently nauseous. States she typically experiences nausea with headache when she gets hungry. Tried to get up out of bed, but felt woozy. Reports no BMs, flatulence or output from ostomy bag. Urinating with Foley catheter. Tolerating NPO, ice chips only.     Objective: Vital signs in last 24 hours: Temp:  [97.3 F (36.3 C)-100.5 F (38.1 C)] 98.7 F (37.1 C) (11/29 0400) Pulse Rate:  [71-101] 88 (11/29 0400) Resp:  [17-25] 21 (11/29 0400) BP: (86-158)/(51-86) 99/61 mmHg (11/29 0400) SpO2:  [96 %-100 %] 99 % (11/29 0400) Last BM Date: 02/17/15  Intake/Output from previous day: 11/28 0701 - 11/29 0700 In: 5130.8 [I.V.:4230.8; IV Piggyback:900] Out: 2560 [Urine:2550; Emesis/NG output:10] Intake/Output this shift:   General: Well appearing, WDWN. Appears fatigued. Complaining of pain and nausea. Family at beside.  HEENT: Nasal canula. Sclera clear, not injected. Neck soft and supple, no masses.  CV: RRR, no murmurs, rubs or gallops. Normal S1 and S2.  GU: Foley catheter in place. Draining clear urine appropriately. GI: Ostomy bag in place LUQ, no stool as this time, minor bloody drainage appropriate for post-op day1. Colon with minor inflammation, appropriate for post-op day 1.  Midline incision covered with honeycomb dressing, looks good with minor drainage. Bowel sounds hypoactive.   Extrm: SCDs. Pulses present and strong upper and lower, b/l. No swelling, edema, skin changes.    Lab Results:   Recent Labs  02/19/15 0500 02/20/15 0504  WBC 12.0* 13.6*  HGB 11.4* 11.2*  HCT 35.1* 34.6*  PLT 387 430*   BMET  Recent Labs  02/20/15 0504  NA 138  K 4.2  CL 106  CO2 25  GLUCOSE 103*  BUN <5*  CREATININE 0.57  CALCIUM 8.5*   PT/INR  Recent Labs  02/19/15 0500  LABPROT 15.0  INR 1.16   ABG No results for input(s):  PHART, HCO3 in the last 72 hours.  Invalid input(s): PCO2, PO2  Studies/Results: No results found.  Anti-infectives: Anti-infectives    Start     Dose/Rate Route Frequency Ordered Stop   02/20/15 1400  metroNIDAZOLE (FLAGYL) IVPB 500 mg     500 mg 100 mL/hr over 60 Minutes Intravenous Every 8 hours 02/19/15 1121     02/20/15 0600  clindamycin (CLEOCIN) IVPB 900 mg  Status:  Discontinued     900 mg 100 mL/hr over 30 Minutes Intravenous To Short Stay 02/19/15 0653 02/19/15 0844   02/20/15 0600  gentamicin (GARAMYCIN) 330 mg in dextrose 5 % 100 mL IVPB  Status:  Discontinued     5 mg/kg  66.2 kg 108.3 mL/hr over 60 Minutes Intravenous To Short Stay 02/19/15 0653 02/19/15 0844   02/20/15 0600  clindamycin (CLEOCIN) IVPB 900 mg     900 mg 100 mL/hr over 30 Minutes Intravenous To ShortStay Surgical 02/19/15 0844 02/20/15 0800   02/20/15 0600  gentamicin (GARAMYCIN) 300 mg in dextrose 5 % 100 mL IVPB     5 mg/kg  60.7 kg (Adjusted) 107.5 mL/hr over 60 Minutes Intravenous To ShortStay Surgical 02/19/15 0844 02/20/15 0815   02/19/15 1400  neomycin (MYCIFRADIN) tablet 1,000 mg     1,000 mg Oral 3 times per day 02/19/15 1107 02/19/15 2156   02/19/15 1400  metroNIDAZOLE (FLAGYL) tablet 1,000 mg     1,000 mg Oral 3 times daily 02/19/15 0859 02/19/15 2154   02/19/15  0915  clindamycin (CLEOCIN) IVPB 900 mg  Status:  Discontinued     900 mg 100 mL/hr over 30 Minutes Intravenous On call to O.R. 02/19/15 0902 02/19/15 0909   02/10/15 1800  ciprofloxacin (CIPRO) IVPB 400 mg     400 mg 200 mL/hr over 60 Minutes Intravenous Every 12 hours 02/10/15 1720     02/10/15 1800  metroNIDAZOLE (FLAGYL) IVPB 500 mg  Status:  Discontinued     500 mg 100 mL/hr over 60 Minutes Intravenous Every 8 hours 02/10/15 1720 02/19/15 1121      Assessment/Plan: s/p Procedure(s): LAPAROSCOPY DIAGNOSTIC (N/A) EXPLORATORY LAPAROTOMY AND PARTIAL COLECTOMY (N/A) COLOSTOMY (N/A)  Continue NPO, ice chips only.   Pain  medication regimin to include changes made by Dr. Rosendo Gros.  Antiemetics PRN Continue Cipro/flagyl Continue IV fluids Encourage to get up in chair and walk as tolerated.  SCDs for VTE prophylaxis    LOS: 11 days    [facility], PA-S 02/21/2015

## 2015-02-22 LAB — CBC
HEMATOCRIT: 28.6 % — AB (ref 36.0–46.0)
Hemoglobin: 9.1 g/dL — ABNORMAL LOW (ref 12.0–15.0)
MCH: 25.2 pg — ABNORMAL LOW (ref 26.0–34.0)
MCHC: 31.8 g/dL (ref 30.0–36.0)
MCV: 79.2 fL (ref 78.0–100.0)
Platelets: 384 10*3/uL (ref 150–400)
RBC: 3.61 MIL/uL — ABNORMAL LOW (ref 3.87–5.11)
RDW: 16.4 % — AB (ref 11.5–15.5)
WBC: 14.6 10*3/uL — AB (ref 4.0–10.5)

## 2015-02-22 LAB — BASIC METABOLIC PANEL
ANION GAP: 4 — AB (ref 5–15)
BUN: <5 mg/dL — ABNORMAL LOW (ref 6–20)
CALCIUM: 8.3 mg/dL — AB (ref 8.9–10.3)
CO2: 29 mmol/L (ref 22–32)
Chloride: 106 mmol/L (ref 101–111)
Creatinine, Ser: 0.45 mg/dL (ref 0.44–1.00)
GFR calc Af Amer: >60 mL/min (ref >60)
GFR calc non Af Amer: >60 mL/min (ref >60)
GLUCOSE: 109 mg/dL — AB (ref 65–99)
Potassium: 4 mmol/L (ref 3.5–5.1)
Sodium: 139 mmol/L (ref 135–145)

## 2015-02-22 MED ORDER — ALVIMOPAN 12 MG PO CAPS
12.0000 mg | ORAL_CAPSULE | Freq: Two times a day (BID) | ORAL | Status: DC
  Administered 2015-02-22 (×2): 12 mg via ORAL
  Filled 2015-02-22 (×3): qty 1

## 2015-02-22 MED ORDER — ONDANSETRON HCL 4 MG/2ML IJ SOLN: 4.0000 mg | INTRAMUSCULAR | Status: DC | PRN

## 2015-02-22 MED ORDER — ONDANSETRON HCL 4 MG PO TABS: 4.0000 mg | ORAL_TABLET | ORAL | Status: DC | PRN

## 2015-02-22 MED ORDER — MORPHINE SULFATE (PF) 2 MG/ML IV SOLN: 2.0000 mg | INTRAVENOUS | Status: DC | PRN

## 2015-02-22 MED ORDER — PROMETHAZINE HCL 25 MG/ML IJ SOLN
25.0000 mg | INTRAMUSCULAR | Status: DC | PRN
  Administered 2015-02-22: 25 mg via INTRAVENOUS
  Filled 2015-02-22: qty 1

## 2015-02-22 MED ORDER — ACETAMINOPHEN 10 MG/ML IV SOLN
1000.0000 mg | Freq: Four times a day (QID) | INTRAVENOUS | Status: AC
  Administered 2015-02-22 – 2015-02-23 (×3): 1000 mg via INTRAVENOUS
  Filled 2015-02-22 (×3): qty 100

## 2015-02-22 NOTE — Progress Notes (Signed)
Patient ID: Nannie Starzyk, female   DOB: 29-Aug-1970, 44 y.o.   MRN: [id number]     [address] [place], [place] [address]    Phone: [phone] FAX: [phone]     Subjective: Vomiting overnight, better now.  No flatus yet.  Little mobility.  Does not want PCA. VSS.  Afebrile.   Objective:  Vital signs:  Filed Vitals:   02/22/15 0219 02/22/15 0400 02/22/15 0443 02/22/15 0558  BP: 115/62   110/61  Pulse: 76   89  Temp: 98.6 F (37 C)   98.2 F (36.8 C)  TempSrc: Oral   Oral  Resp: '20 19 19 24  ' Height:      Weight:      SpO2: 99% 98% 98% 100%    Last BM Date: 02/17/15  Intake/Output   Yesterday:  11/29 0701 - 11/30 0700 In: 10377.7 [I.V.:7765.2; IV Piggyback:2612.5] Out: 3075 [Urine:3075] This shift: I/O last 3 completed shifts: In: 12121 [I.V.:9508.5; IV Piggyback:2612.5] Out: 4193 [Urine:4625]    Physical Exam: General: Pt awake/alert/oriented x4 in no acute distress Chest: cta. No chest wall pain w good excursion CV:  Pulses intact.  Regular rhythm MS: Normal AROM mjr joints.  No obvious deformity Abdomen: Soft.  Nondistended.   Mildly tender at incisions only.  Incisions are c/d/i.  Stoma is pink and viable, no stool/flatus. No evidence of peritonitis.  No incarcerated hernias. Ext:  SCDs BLE.  No mjr edema.  No cyanosis Skin: No petechiae / purpura   Problem List:   Principal Problem:   Colonic diverticular abscess Active Problems:   Constipation   Anemia, iron deficiency   Liver lesion    Results:   Labs: Results for orders placed or performed during the hospital encounter of 02/10/15 (from the past 48 hour(s))  CBC     Status: Abnormal   Collection Time: 02/22/15  4:25 AM  Result Value Ref Range   WBC 14.6 (H) 4.0 - 10.5 K/uL   RBC 3.61 (L) 3.87 - 5.11 MIL/uL   Hemoglobin 9.1 (L) 12.0 - 15.0 g/dL   HCT 28.6 (L) 36.0 - 46.0 %   MCV 79.2 78.0 - 100.0 fL   MCH 25.2 (L)  26.0 - 34.0 pg   MCHC 31.8 30.0 - 36.0 g/dL   RDW 16.4 (H) 11.5 - 15.5 %   Platelets 384 150 - 400 K/uL  Basic metabolic panel     Status: Abnormal   Collection Time: 02/22/15  4:25 AM  Result Value Ref Range   Sodium 139 135 - 145 mmol/L   Potassium 4.0 3.5 - 5.1 mmol/L   Chloride 106 101 - 111 mmol/L   CO2 29 22 - 32 mmol/L   Glucose, Bld 109 (H) 65 - 99 mg/dL   BUN <5 (L) 6 - 20 mg/dL   Creatinine, Ser 0.45 0.44 - 1.00 mg/dL   Calcium 8.3 (L) 8.9 - 10.3 mg/dL   GFR calc non Af Amer >60 >60 mL/min   GFR calc Af Amer >60 >60 mL/min    Comment: (NOTE) The eGFR has been calculated using the CKD EPI equation. This calculation has not been validated in all clinical situations. eGFR's persistently <60 mL/min signify possible Chronic Kidney Disease.    Anion gap 4 (L) 5 - 15    Imaging / Studies: No results found.  Medications / Allergies:  Scheduled Meds: . acetaminophen  1,000 mg Intravenous 4 times per  day  . alvimopan  12 mg Oral BID  . ciprofloxacin  400 mg Intravenous Q12H  . fluticasone  2 spray Each Nare Daily  . heparin  5,000 Units Subcutaneous 3 times per day  . metronidazole  500 mg Intravenous Q8H  . sodium chloride  10-40 mL Intracatheter Q12H   Continuous Infusions: . dextrose 5 % and 0.9 % NaCl with KCl 20 mEq/L 125 mL/hr at 02/21/15 1531   PRN Meds:.LORazepam, methocarbamol (ROBAXIN)  IV, morphine injection, ondansetron **OR** [DISCONTINUED] ondansetron (ZOFRAN) IV, promethazine, sodium chloride  Antibiotics: Anti-infectives    Start     Dose/Rate Route Frequency Ordered Stop   02/20/15 1400  metroNIDAZOLE (FLAGYL) IVPB 500 mg     500 mg 100 mL/hr over 60 Minutes Intravenous Every 8 hours 02/19/15 1121     02/20/15 0600  clindamycin (CLEOCIN) IVPB 900 mg  Status:  Discontinued     900 mg 100 mL/hr over 30 Minutes Intravenous To Short Stay 02/19/15 0653 02/19/15 0844   02/20/15 0600  gentamicin (GARAMYCIN) 330 mg in dextrose 5 % 100 mL IVPB  Status:   Discontinued     5 mg/kg  66.2 kg 108.3 mL/hr over 60 Minutes Intravenous To Short Stay 02/19/15 0653 02/19/15 0844   02/20/15 0600  clindamycin (CLEOCIN) IVPB 900 mg     900 mg 100 mL/hr over 30 Minutes Intravenous To ShortStay Surgical 02/19/15 0844 02/20/15 0800   02/20/15 0600  gentamicin (GARAMYCIN) 300 mg in dextrose 5 % 100 mL IVPB     5 mg/kg  60.7 kg (Adjusted) 107.5 mL/hr over 60 Minutes Intravenous To ShortStay Surgical 02/19/15 0844 02/20/15 0815   02/19/15 1400  neomycin (MYCIFRADIN) tablet 1,000 mg     1,000 mg Oral 3 times per day 02/19/15 1107 02/19/15 2156   02/19/15 1400  metroNIDAZOLE (FLAGYL) tablet 1,000 mg     1,000 mg Oral 3 times daily 02/19/15 0859 02/19/15 2154   02/19/15 0915  clindamycin (CLEOCIN) IVPB 900 mg  Status:  Discontinued     900 mg 100 mL/hr over 30 Minutes Intravenous On call to O.R. 02/19/15 0902 02/19/15 0909   02/10/15 1800  ciprofloxacin (CIPRO) IVPB 400 mg     400 mg 200 mL/hr over 60 Minutes Intravenous Every 12 hours 02/10/15 1720     02/10/15 1800  metroNIDAZOLE (FLAGYL) IVPB 500 mg  Status:  Discontinued     500 mg 100 mL/hr over 60 Minutes Intravenous Every 8 hours 02/10/15 1720 02/19/15 1121        Assessment/Plan Colon mass POD#2 LAR with Hartman's pouch and colostomy, appendectomy---Dr. Rosendo Gros DC foley Mobilize Antiemetics IS Await pathology  WOC for ostomy teaching  ID-cipro/flagyl D#2/7 total post op VTE prophylaxis-SCD/heparin FEN-NPO x ice chips, IVF, DC PCA Dispo-continue inpatient   Erby Pian, ANP-BC [facility] Pager [phone](7A-4:30P) For consults and floor pages call [phone](7A-4:30P)  02/22/2015 7:47 AM

## 2015-02-22 NOTE — Consult Note (Signed)
WOC ostomy consult note Stoma type/location:  Consult requested for ostomy teaching session. Stomal assessment/size: Stoma red and viable, slightly above skin level, 1 1/4 inches Peristomal assessment: Intact skin surrounding Output: No stool or flatus at this time.  Ostomy pouching: 1pc with barrier ring  Education provided:  Demonstrated pouch change using 2 piece pouch and barrier ring to maintain seal.  Pt feels she would prefer a one piece pouch after discharge.  She was able to open and close pouch to empty.  Educational materials left at bedside and patient asked appropriate questions. Enrolled patient in [place] program: Yes Supplies at bedside for staff nurse use. Julien Girt MSN, RN, [place], [place], [place]

## 2015-02-23 MED ORDER — SODIUM CHLORIDE 0.9 % IV SOLN
INTRAVENOUS | Status: DC
  Administered 2015-02-23: 15:00:00 via INTRAVENOUS

## 2015-02-23 MED ORDER — METHOCARBAMOL 750 MG PO TABS: 750.0000 mg | ORAL_TABLET | Freq: Four times a day (QID) | ORAL | Status: DC | PRN

## 2015-02-23 MED ORDER — HYDROCODONE-ACETAMINOPHEN 5-325 MG PO TABS: 1.0000 | ORAL_TABLET | ORAL | Status: DC | PRN

## 2015-02-23 MED ORDER — ACETAMINOPHEN 500 MG PO TABS
1000.0000 mg | ORAL_TABLET | Freq: Three times a day (TID) | ORAL | Status: DC
  Administered 2015-02-23 – 2015-02-24 (×4): 1000 mg via ORAL
  Filled 2015-02-23 (×4): qty 2

## 2015-02-23 NOTE — Care Management Note (Signed)
Case Management Note  Patient Details  Name: Carla Mills MRN: [id number] Date of Birth: [birth year]  Subjective/Objective:                    Action/Plan:  Will need order for home health RN for ostomy care / teaching  Expected Discharge Date:                  Expected Discharge Plan:  [place]  In-House Referral:     Discharge planning Services  CM Consult  Post Acute Care Choice:    Choice offered to:     DME Arranged:    DME Agency:     HH Arranged:    [place] Agency:     Status of Service:  In process, will continue to follow  Medicare Important Message Given:    Date Medicare IM Given:    Medicare IM give by:    Date Additional Medicare IM Given:    Additional Medicare Important Message give by:     If discussed at [place] of Stay Meetings, dates discussed:    Additional Comments:  Marilu Favre, RN 02/23/2015, 2:52 PM

## 2015-02-23 NOTE — Progress Notes (Signed)
Patient ID: Carla Mills, female   DOB: February 14, 1971, 44 y.o.   MRN: [id number]     [address] [place], [place] [address]    Phone: [phone] FAX: [phone]     Subjective: Walked x4. Only taking tylenol.  Pain controlled.  Has bowel function. Afebrile.  VSS. Voiding. Asking about path results, no results yet.  Objective:  Vital signs:  Filed Vitals:   02/22/15 0558 02/22/15 1345 02/22/15 2131 02/23/15 0602  BP: 110/61 108/57 107/63 108/60  Pulse: 89 100 85 81  Temp: 98.2 F (36.8 C) 98.2 F (36.8 C) 98.2 F (36.8 C) 98.7 F (37.1 C)  TempSrc: Oral Oral Oral Oral  Resp: '24 20 20 20  ' Height:      Weight:      SpO2: 100% 100% 96% 95%    Last BM Date: 02/23/15  Intake/Output   Yesterday:  11/30 0701 - 12/01 0700 In: 4697.9 [I.V.:3097.9; IV Piggyback:1600] Out: 1900 [Urine:1850; Stool:50] This shift:    I/O last 3 completed shifts: In: 6135 [I.V.:4535; IV Piggyback:1600] Out: 9211 [Urine:3475; Stool:50]    Physical Exam: General: Pt awake/alert/oriented x4 in no acute distress Chest: cta. No chest wall pain w good excursion CV: Pulses intact. Regular rhythm MS: Normal AROM mjr joints. No obvious deformity Abdomen: Soft. Nondistended. Mildly tender at incisions only. dressings removed, midline and lap sites c/d/i.  Stoma is pink and viable, stool present. No evidence of peritonitis. No incarcerated hernias. Ext: SCDs BLE. No mjr edema. No cyanosis Skin: No petechiae / purpura    Problem List:   Principal Problem:   Colonic diverticular abscess Active Problems:   Constipation   Anemia, iron deficiency   Liver lesion    Results:   Labs: Results for orders placed or performed during the hospital encounter of 02/10/15 (from the past 48 hour(s))  CBC     Status: Abnormal   Collection Time: 02/22/15  4:25 AM  Result Value Ref Range   WBC 14.6 (H) 4.0 - 10.5 K/uL   RBC  3.61 (L) 3.87 - 5.11 MIL/uL   Hemoglobin 9.1 (L) 12.0 - 15.0 g/dL   HCT 28.6 (L) 36.0 - 46.0 %   MCV 79.2 78.0 - 100.0 fL   MCH 25.2 (L) 26.0 - 34.0 pg   MCHC 31.8 30.0 - 36.0 g/dL   RDW 16.4 (H) 11.5 - 15.5 %   Platelets 384 150 - 400 K/uL  Basic metabolic panel     Status: Abnormal   Collection Time: 02/22/15  4:25 AM  Result Value Ref Range   Sodium 139 135 - 145 mmol/L   Potassium 4.0 3.5 - 5.1 mmol/L   Chloride 106 101 - 111 mmol/L   CO2 29 22 - 32 mmol/L   Glucose, Bld 109 (H) 65 - 99 mg/dL   BUN <5 (L) 6 - 20 mg/dL   Creatinine, Ser 0.45 0.44 - 1.00 mg/dL   Calcium 8.3 (L) 8.9 - 10.3 mg/dL   GFR calc non Af Amer >60 >60 mL/min   GFR calc Af Amer >60 >60 mL/min    Comment: (NOTE) The eGFR has been calculated using the CKD EPI equation. This calculation has not been validated in all clinical situations. eGFR's persistently <60 mL/min signify possible Chronic Kidney Disease.    Anion gap 4 (L) 5 - 15    Imaging / Studies: No results found.  Medications / Allergies:  Scheduled Meds: .  acetaminophen  1,000 mg Oral TID  . alvimopan  12 mg Oral BID  . ciprofloxacin  400 mg Intravenous Q12H  . fluticasone  2 spray Each Nare Daily  . heparin  5,000 Units Subcutaneous 3 times per day  . metronidazole  500 mg Intravenous Q8H  . sodium chloride  10-40 mL Intracatheter Q12H   Continuous Infusions: . sodium chloride     PRN Meds:.HYDROcodone-acetaminophen, LORazepam, methocarbamol, morphine injection, ondansetron **OR** [DISCONTINUED] ondansetron (ZOFRAN) IV, promethazine, sodium chloride  Antibiotics: Anti-infectives    Start     Dose/Rate Route Frequency Ordered Stop   02/20/15 1400  metroNIDAZOLE (FLAGYL) IVPB 500 mg     500 mg 100 mL/hr over 60 Minutes Intravenous Every 8 hours 02/19/15 1121     02/20/15 0600  clindamycin (CLEOCIN) IVPB 900 mg  Status:  Discontinued     900 mg 100 mL/hr over 30 Minutes Intravenous To Short Stay 02/19/15 0653 02/19/15 0844    02/20/15 0600  gentamicin (GARAMYCIN) 330 mg in dextrose 5 % 100 mL IVPB  Status:  Discontinued     5 mg/kg  66.2 kg 108.3 mL/hr over 60 Minutes Intravenous To Short Stay 02/19/15 0653 02/19/15 0844   02/20/15 0600  clindamycin (CLEOCIN) IVPB 900 mg     900 mg 100 mL/hr over 30 Minutes Intravenous To ShortStay Surgical 02/19/15 0844 02/20/15 0800   02/20/15 0600  gentamicin (GARAMYCIN) 300 mg in dextrose 5 % 100 mL IVPB     5 mg/kg  60.7 kg (Adjusted) 107.5 mL/hr over 60 Minutes Intravenous To ShortStay Surgical 02/19/15 0844 02/20/15 0815   02/19/15 1400  neomycin (MYCIFRADIN) tablet 1,000 mg     1,000 mg Oral 3 times per day 02/19/15 1107 02/19/15 2156   02/19/15 1400  metroNIDAZOLE (FLAGYL) tablet 1,000 mg     1,000 mg Oral 3 times daily 02/19/15 0859 02/19/15 2154   02/19/15 0915  clindamycin (CLEOCIN) IVPB 900 mg  Status:  Discontinued     900 mg 100 mL/hr over 30 Minutes Intravenous On call to O.R. 02/19/15 0902 02/19/15 0909   02/10/15 1800  ciprofloxacin (CIPRO) IVPB 400 mg     400 mg 200 mL/hr over 60 Minutes Intravenous Every 12 hours 02/10/15 1720     02/10/15 1800  metroNIDAZOLE (FLAGYL) IVPB 500 mg  Status:  Discontinued     500 mg 100 mL/hr over 60 Minutes Intravenous Every 8 hours 02/10/15 1720 02/19/15 1121       Assessment/Plan Colon mass POD#3 LAR with Hartman's pouch and colostomy, appendectomy---Dr. Rosendo Gros -ileus resolved, advance to clears, fulls for dinner is she's doing well.  -Mobilize, IS -Await pathology --will call today for status -WOC for ostomy teaching  -Will need staples removed 4-6 days  ID-cipro/flagyl D#3/7 total post op VTE prophylaxis-SCD/heparin FEN-clears, decrease IVF and change to NS, add PO pain meds(only taking tylenol) Dispo-continue inpatient    Erby Pian, ANP-BC [facility] Pager [phone](7A-4:30P) For consults and floor pages call [phone](7A-4:30P)  02/23/2015 8:08 AM

## 2015-02-24 ENCOUNTER — Telehealth: Payer: Self-pay | Admitting: *Deleted

## 2015-02-24 ENCOUNTER — Other Ambulatory Visit: Payer: Self-pay | Admitting: *Deleted

## 2015-02-24 DIAGNOSIS — C189 Malignant neoplasm of colon, unspecified: Secondary | ICD-10-CM

## 2015-02-24 LAB — CBC
HEMATOCRIT: 27.9 % — AB (ref 36.0–46.0)
Hemoglobin: 8.8 g/dL — ABNORMAL LOW (ref 12.0–15.0)
MCH: 24.9 pg — ABNORMAL LOW (ref 26.0–34.0)
MCHC: 31.5 g/dL (ref 30.0–36.0)
MCV: 78.8 fL (ref 78.0–100.0)
PLATELETS: 478 10*3/uL — AB (ref 150–400)
RBC: 3.54 MIL/uL — AB (ref 3.87–5.11)
RDW: 16.3 % — AB (ref 11.5–15.5)
WBC: 7.9 10*3/uL (ref 4.0–10.5)

## 2015-02-24 MED ORDER — HYDROCODONE-ACETAMINOPHEN 5-325 MG PO TABS: 1.0000 | ORAL_TABLET | ORAL | Status: DC | PRN

## 2015-02-24 MED ORDER — CIPROFLOXACIN HCL 500 MG PO TABS: 500.0000 mg | ORAL_TABLET | Freq: Two times a day (BID) | ORAL | Status: DC

## 2015-02-24 MED ORDER — METRONIDAZOLE 500 MG PO TABS: 500.0000 mg | ORAL_TABLET | Freq: Three times a day (TID) | ORAL | Status: DC

## 2015-02-24 NOTE — Telephone Encounter (Signed)
Oncology Nurse Navigator Documentation  Oncology Nurse Navigator Flowsheets 02/24/2015  Referral date to RadOnc/MedOnc 02/24/2015  Navigator Encounter Type Introductory phone call  Received referral from [place] . Called Dar[facility] to inquire if she would like to be seen by Dr. Marin Olp in [facility] office since she lives there. She agrees this would be preferred. Referral forwarded to [facility] office.

## 2015-02-24 NOTE — Discharge Summary (Signed)
Physician Discharge Summary  Carla Mills [id number] DOB: [birth year] DOA: 02/10/2015  PCP: No PCP Per Patient  Consultation:  Initially admitted to internal medicine, transferred to surgical service post op  GI---Dr. Amedeo Plenty   Admit date: 02/10/2015 Discharge date: 02/24/2015  Recommendations for Outpatient Follow-up:   Follow-up Information    Follow up with [facility] On 03/03/2015.   Specialty:  General Surgery   Why:  appointment time: 2PM with nurse visit for staple removal   Contact information:   [place] [address] [phone]       Follow up with Reyes Ivan, MD On 03/13/2015.   Specialty:  General Surgery   Why:  arrive by 1:45PM for a 2:10PM post operative check with your surgeon.    Contact information:   [place]  [place] [address] [phone]      Discharge Diagnoses:  1. Colon mass   Surgical Procedure: LAR with Hartman's pouch and colostomy, appendectomy---Dr. Rosendo Gros  Discharge Condition: stable Disposition: home  Diet recommendation: regular  Filed Weights   02/10/15 1632 02/15/15 0748 02/16/15 0517  Weight: 61.236 kg (135 lb) 61.236 kg (135 lb) 66.18 kg (145 lb 14.4 oz)    Filed Vitals:   02/23/15 2121 02/24/15 0619  BP: 119/72 104/68  Pulse: 79 72  Temp: 98.9 F (37.2 C) 98.6 F (37 C)  Resp: 18 18      Hospital Course:  Carla Mills is a 44 year old female who presented to [facility] with a colonic abscess seen on a CT scan ordered by gastroenterology.  She was admitted and treated for diverticulitis with antibiotics.   Surgery and gastroenterology were consulted.  She subsequently had a flexible sigmoidoscopy for concerns for malignancy.  Pathology showed high grade dysplasia.  She subsequently had a hartman's.  She had a normal post operative course.  Pathology showed adenocarcinoma with 1 positive lymph node.  She will therefore be referred to oncology on outpatient  basis.  On POD#4 the patient has excellent ostomy output, afebrile, VSS, ambulating and taking tylenol for pain.  She was therefore felt stable for discharge home with additional 10 days of antibiotics totaling 14 days. Follow up has been arranged.  She was encouraged to call with questions or concerns.    Physical Exam: General: Pt awake/alert/oriented x4 in no acute distress Chest: cta. No chest wall pain w good excursion CV: Pulses intact. Regular rhythm MS: Normal AROM mjr joints. No obvious deformity Abdomen: Soft. Nondistended. Mildly tender at incisions only.  midline and lap sites c/d/i.  Stoma is pink and viable, stool present. No evidence of peritonitis. No incarcerated hernias. Ext: SCDs BLE. No mjr edema. No cyanosis Skin: No petechiae / purpura   Discharge Instructions     Medication List    STOP taking these medications        polyethylene glycol packet  Commonly known as:  MIRALAX / GLYCOLAX      TAKE these medications        BENEFIBER PO  Take 1 packet by mouth daily as needed (constipation).     ciprofloxacin 500 MG tablet  Commonly known as:  CIPRO  Take 1 tablet (500 mg total) by mouth 2 (two) times daily.     fexofenadine 180 MG tablet  Commonly known as:  ALLEGRA  Take 180 mg by mouth daily as needed for allergies.     fluticasone 50 MCG/ACT nasal spray  Commonly known as:  FLONASE  Place 2 sprays into the nose daily.     HYDROcodone-acetaminophen 5-325 MG tablet  Commonly known as:  NORCO/VICODIN  Take 1-2 tablets by mouth every 4 (four) hours as needed for moderate pain or severe pain.     ibuprofen 200 MG tablet  Commonly known as:  ADVIL,MOTRIN  Take 400 mg by mouth every 6 (six) hours as needed for headache or mild pain.     metroNIDAZOLE 500 MG tablet  Commonly known as:  FLAGYL  Take 1 tablet (500 mg total) by mouth 3 (three) times daily.     PSEUDO PO  Take 2 tablets by mouth daily as needed (congestion).            Follow-up Information    Follow up with [facility] On 03/03/2015.   Specialty:  General Surgery   Why:  appointment time: 2PM with nurse visit for staple removal   Contact information:   [place] [address] [phone]       Follow up with Reyes Ivan, MD On 03/13/2015.   Specialty:  General Surgery   Why:  arrive by 1:45PM for a 2:10PM post operative check with your surgeon.    Contact information:   [place] [address] [phone]        The results of significant diagnostics from this hospitalization (including imaging, microbiology, ancillary and laboratory) are listed below for reference.    Significant Diagnostic Studies: Dg Chest 2 View  02/18/2015  CLINICAL DATA:  Preop for colon surgery EXAM: CHEST  2 VIEW COMPARISON:  None. FINDINGS: Cardiomediastinal silhouette is unremarkable. There is right arm PICC line with tip in SVC. No acute infiltrate or pleural effusion. No pulmonary edema. Bony thorax is unremarkable IMPRESSION: No active cardiopulmonary disease. Electronically Signed   By: Lahoma Crocker M.D.   On: 02/18/2015 16:56   Ct Abdomen Pelvis W Contrast  02/10/2015  ADDENDUM REPORT: 02/10/2015 11:46 ADDENDUM: Note that in this setting, the small low densities in the liver could possibly be early foci of inflammation or metastatic disease. They are nonspecific however and could be small cysts or hemangiomas. These results were called by telephone at the time of interpretation on 02/10/2015 at 11:30 am to [facility] , who verbally acknowledged these results. Electronically Signed   By: Nelson Chimes M.D.   On: 02/10/2015 11:46  02/10/2015  CLINICAL DATA:  Lower abdominal and pelvic pain with constipation for several weeks. EXAM: CT ABDOMEN AND PELVIS WITH CONTRAST TECHNIQUE: Multidetector CT imaging of the abdomen and pelvis was performed using the standard protocol following bolus administration  of intravenous contrast. CONTRAST:  175mL ISOVUE-300 IOPAMIDOL (ISOVUE-300) INJECTION 61% COMPARISON:  Radiography 02/08/2015 FINDINGS: Lung bases are clear. No pleural or pericardial fluid. There are numerous sub cm low densities throughout the liver, probably 6 or 7 in number. These are too small to characterize by CT. No calcified gallstones. The spleen is normal. The pancreas is normal. The adrenal glands are normal. The kidneys are normal. No cyst, mass, stone or hydronephrosis. The aorta and IVC are normal. No retroperitoneal mass or lymphadenopathy. No small bowel pathology is seen. There is a large amount of fecal matter throughout the colon consistent with constipation. The colon appears intrinsically normal until the rectosigmoid region where there appears to be wall thickening and pericolic edema. There appear to be small lymph nodes in the adjacent fat. The appearance is nonspecific by CT,  but the possibility of a colon mass with local extension does exist. The differential diagnosis would include diverticulitis and other causes of colitis. There is probably a contained abscess in the central pelvis measuring approximately 3 cm in diameter. Uterus appears normal. No ovarian lesion is seen. Despite this extensive pelvic pathology, there does not appear to be any free fluid in the pelvis. No significant bone finding. IMPRESSION: Advanced pathology at the rectosigmoid junction region with marked regional stranding and edema with what appears to be a contained 3 cm abscess in the central pelvis. There are small regional lymph nodes. Differential diagnosis is that of diverticulitis with perforation and abscess, other causes of colitis with micro perforation an abscess, and colon cancer with perforation and abscess. Electronically Signed: By: Nelson Chimes M.D. On: 02/10/2015 11:22   Mr Liver W Wo Contrast  02/14/2015  CLINICAL DATA:  44 year old female with past medical history of intermittent constipation  for 1 year presenting with abdominal pain. Recent CT examination demonstrating presumable diverticular abscess. Multiple hypodensities in the liver noted on the CT examination concerning for potential infection versus metastatic disease. Further evaluation. EXAM: MRI ABDOMEN WITHOUT AND WITH CONTRAST TECHNIQUE: Multiplanar multisequence MR imaging of the abdomen was performed both before and after the administration of intravenous contrast. CONTRAST:  15mL MULTIHANCE GADOBENATE DIMEGLUMINE 529 MG/ML IV SOLN COMPARISON:  CT of the abdomen and pelvis 02/10/2015. FINDINGS: Lower chest:  Unremarkable. Hepatobiliary: There are multiple sub cm lesions in the liver which are low T1 signal intensity, high T2 signal intensity, and do not enhance, most compatible with tiny cysts and/or biliary hamartomas. No other larger more suspicious appearing cystic or solid hepatic lesions are noted. No intra or extrahepatic biliary ductal dilatation. Gallbladder is normal in appearance. Pancreas: No pancreatic mass. No pancreatic ductal dilatation. No pancreatic or peripancreatic fluid or inflammatory changes. Spleen: Unremarkable. Adrenals/Urinary Tract: Bilateral adrenal glands and bilateral kidneys are normal in appearance. No hydroureteronephrosis in the visualized abdomen. Stomach/Bowel: Unremarkable. Vascular/Lymphatic: Mild ectasia of the celiac axis which measures up to 1 cm in diameter. No lymphadenopathy noted in the visualized abdomen. Other: No significant volume of ascites in the visualized peritoneal cavity. Musculoskeletal: No aggressive appearing osseous lesions are noted in the visualized portions of the skeleton. IMPRESSION: 1. The liver lesions of concern have imaging characteristics compatible with tiny cysts and/or biliary hamartomas. No definite findings to suggest developing intrahepatic abscesses or metastatic disease. 2. Additional incidental findings, as above. Electronically Signed   By: Vinnie Langton M.D.    On: 02/14/2015 16:40   Dg Abd 2 Views  02/09/2015  CLINICAL DATA:  Chronic constipation EXAM: ABDOMEN - 2 VIEW COMPARISON:  None FINDINGS: Supine and erect views of the abdomen show a moderate amount of feces throughout the colon. No bowel obstruction is seen on the erect view. No opaque calculi are noted. The bones are unremarkable. IMPRESSION: Moderate amount of feces throughout the colon. No bowel obstruction or free air. Electronically Signed   By: Ivar Drape M.D.   On: 02/09/2015 08:15    Microbiology: No results found for this or any previous visit (from the past 240 hour(s)).   Labs: Basic Metabolic Panel:  Recent Labs Lab 02/20/15 0504 02/22/15 0425  NA 138 139  K 4.2 4.0  CL 106 106  CO2 25 29  GLUCOSE 103* 109*  BUN <5* <5*  CREATININE 0.57 0.45  CALCIUM 8.5* 8.3*   Liver Function Tests: No results for input(s): AST, ALT, ALKPHOS, BILITOT, PROT, ALBUMIN  in the last 168 hours. No results for input(s): LIPASE, AMYLASE in the last 168 hours. No results for input(s): AMMONIA in the last 168 hours. CBC:  Recent Labs Lab 02/19/15 0500 02/20/15 0504 02/22/15 0425 02/24/15 0450  WBC 12.0* 13.6* 14.6* 7.9  HGB 11.4* 11.2* 9.1* 8.8*  HCT 35.1* 34.6* 28.6* 27.9*  MCV 78.7 79.2 79.2 78.8  PLT 387 430* 384 478*   Cardiac Enzymes: No results for input(s): CKTOTAL, CKMB, CKMBINDEX, TROPONINI in the last 168 hours. BNP: BNP (last 3 results) No results for input(s): BNP in the last 8760 hours.  ProBNP (last 3 results) No results for input(s): PROBNP in the last 8760 hours.  CBG: No results for input(s): GLUCAP in the last 168 hours.  Principal Problem:   Colonic diverticular abscess Active Problems:   Constipation   Anemia, iron deficiency   Liver lesion   Time coordinating discharge: <30 mins   Signed:  Nelson Noone, ANP-BC

## 2015-02-24 NOTE — Care Management Note (Signed)
Case Management Note  Patient Details  Name: Noami Amor MRN: [id number] Date of Birth: 1971/01/24  Subjective/Objective:                    Action/Plan:  Confirmed face sheet information with patient . Cell phone number is the best number to reach her at .  Expected Discharge Date:                  Expected Discharge Plan:  [facility]  In-House Referral:     Discharge planning Services  CM Consult  Post Acute Care Choice:  Home Health Choice offered to:  Patient  DME Arranged:    DME Agency:     HH Arranged:  RN [place] Agency:  [facility]  Status of Service:  Completed, signed off  Medicare Important Message Given:    Date Medicare IM Given:    Medicare IM give by:    Date Additional Medicare IM Given:    Additional Medicare Important Message give by:     If discussed at [place] of Stay Meetings, dates discussed:    Additional Comments:  Marilu Favre, RN 02/24/2015, 9:37 AM

## 2015-02-24 NOTE — Discharge Instructions (Signed)

## 2015-02-24 NOTE — Progress Notes (Signed)
Discharge paperwork given to patient. Questions answered. Patient ready for discharge.

## 2015-02-24 NOTE — Consult Note (Addendum)
WOC ostomy consult note Stoma type/location: Ostomy teaching session performed, pt plans to discharge home today with home health assistance. Stomal assessment/size: Stoma red and viable, slightly above skin level, 1 1/4 inches Peristomal assessment: Intact skin surrounding Output: Mod amt brown semi-formed stool in pouch. Ostomy pouching: 1pc with barrier ring  Education provided: Pt was able to stand in front of the mirror and perform pouch change using 1 piece pouch and barrier ring to maintain seal without assistance. She was able to open and close pouch to empty. Educational materials left at bedside and patient asked appropriate questions. Discussed pouching routines and ordering supplies. Enrolled patient in [place] program: Yes 3 sets of pouches and barrier rings left at at bedside. Julien Girt MSN, RN, [place], [place], [place]

## 2015-03-07 ENCOUNTER — Encounter (HOSPITAL_COMMUNITY): Payer: Self-pay

## 2015-03-09 ENCOUNTER — Ambulatory Visit: Payer: BLUE CROSS/BLUE SHIELD

## 2015-03-09 ENCOUNTER — Ambulatory Visit (HOSPITAL_BASED_OUTPATIENT_CLINIC_OR_DEPARTMENT_OTHER): Payer: BLUE CROSS/BLUE SHIELD | Admitting: Hematology & Oncology

## 2015-03-09 ENCOUNTER — Other Ambulatory Visit (HOSPITAL_BASED_OUTPATIENT_CLINIC_OR_DEPARTMENT_OTHER): Payer: BLUE CROSS/BLUE SHIELD

## 2015-03-09 ENCOUNTER — Encounter: Payer: Self-pay | Admitting: Hematology & Oncology

## 2015-03-09 VITALS — BP 101/68 | HR 77 | Temp 97.3°F | Resp 18 | Ht 65.0 in | Wt 131.0 lb

## 2015-03-09 DIAGNOSIS — C187 Malignant neoplasm of sigmoid colon: Secondary | ICD-10-CM

## 2015-03-09 DIAGNOSIS — C189 Malignant neoplasm of colon, unspecified: Secondary | ICD-10-CM

## 2015-03-09 DIAGNOSIS — Z933 Colostomy status: Secondary | ICD-10-CM

## 2015-03-09 HISTORY — DX: Malignant neoplasm of colon, unspecified: C18.9

## 2015-03-09 LAB — CBC WITH DIFFERENTIAL (CANCER CENTER ONLY)
BASO#: 0 10*3/uL (ref 0.0–0.2)
BASO%: 0.4 % (ref 0.0–2.0)
EOS%: 1.5 % (ref 0.0–7.0)
Eosinophils Absolute: 0.1 10*3/uL (ref 0.0–0.5)
HEMATOCRIT: 32.5 % — AB (ref 34.8–46.6)
HGB: 10.3 g/dL — ABNORMAL LOW (ref 11.6–15.9)
LYMPH#: 1.6 10*3/uL (ref 0.9–3.3)
LYMPH%: 16.9 % (ref 14.0–48.0)
MCH: 25.8 pg — ABNORMAL LOW (ref 26.0–34.0)
MCHC: 31.7 g/dL — ABNORMAL LOW (ref 32.0–36.0)
MCV: 82 fL (ref 81–101)
MONO#: 0.4 10*3/uL (ref 0.1–0.9)
MONO%: 4.5 % (ref 0.0–13.0)
NEUT#: 7.2 10*3/uL — ABNORMAL HIGH (ref 1.5–6.5)
NEUT%: 76.7 % (ref 39.6–80.0)
PLATELETS: 652 10*3/uL — AB (ref 145–400)
RBC: 3.99 10*6/uL (ref 3.70–5.32)
RDW: 18.4 % — AB (ref 11.1–15.7)
WBC: 9.4 10*3/uL (ref 3.9–10.0)

## 2015-03-09 LAB — COMPREHENSIVE METABOLIC PANEL
ALBUMIN: 3.7 g/dL (ref 3.5–5.0)
ALK PHOS: 67 U/L (ref 40–150)
ALT: 11 U/L (ref 0–55)
ANION GAP: 9 meq/L (ref 3–11)
AST: 14 U/L (ref 5–34)
BUN: 11.4 mg/dL (ref 7.0–26.0)
CALCIUM: 9.1 mg/dL (ref 8.4–10.4)
CO2: 25 mEq/L (ref 22–29)
Chloride: 106 mEq/L (ref 98–109)
Creatinine: 0.8 mg/dL (ref 0.6–1.1)
Glucose: 81 mg/dl (ref 70–140)
POTASSIUM: 3.7 meq/L (ref 3.5–5.1)
SODIUM: 140 meq/L (ref 136–145)
Total Bilirubin: 0.35 mg/dL (ref 0.20–1.20)
Total Protein: 6.9 g/dL (ref 6.4–8.3)

## 2015-03-09 LAB — IRON AND TIBC
%SAT: 12 % — ABNORMAL LOW (ref 21–57)
IRON: 33 ug/dL — AB (ref 41–142)
TIBC: 271 ug/dL (ref 236–444)
UIBC: 238 ug/dL (ref 120–384)

## 2015-03-09 LAB — FERRITIN: FERRITIN: 55 ng/mL (ref 9–269)

## 2015-03-09 NOTE — Progress Notes (Signed)
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 Follow-up  : I have colon cancer.  HPI: [name] is a very charming 44 year old white female. She's been in excellent health. She has been getting her yearly mammograms. She exercises.  She began to note some change in her bowel habits. She began to note that he was often more difficult to have a bowel movement. Also she noted that the diameter of the stool was becoming less and less.  She also noted that she is having some lower back discomfort.  She then underwent a CT scan. This showed thickening of the distal sigmoid colon with a 370 or abscess. There appear to be some surrounding lymph nodes.  She had a CEA level that was only 2.1.  She ultimately was referred to gastroenterology. She underwent a flexible sigmoidoscopy. An inflammatory mass was seen at the rectosigmoid junction. Biopsies were taken and the pathology report showed high-grade dysplasia.  Ultimately, she was taken to surgery. She underwent a LAR with a colostomy. The pathology report ([id number]) revealed a moderately differentiated adeno carcinoma. It appeared to be not 0.5 cm. There was perforation through the colonic wall. She had 43 lymph nodes that were negative. There was an adjacent pericolonic lymph node that had direct extension of the tumor. There was no peri-neural invasion. There is no lymphovascular space invasion. All margins were negative.  The pathologist staged this at stage IIB (T4aN0M0) invasive adenocarcinoma of the sigmoid colon.  She had MMR analysis which was normal.  She did have iron infusions prior to her surgery.  She is coming referred to the [facility] for evaluation of any adjuvant therapy.  She looks great. She has a colostomy but this does not appear to be bothering her that much. She is eating well. She's having no problems with  pain or she's having no cough or shortness of breath. She's having no urinary issues. She is not having any fevers. There's not been any leg swelling. She's had no rashes.  She does not smoke. She has occasional glass of wine.  She has 3 kids.  There is no history of colon cancer or other malignancy in the family.  She's never had any other surgery outside of her left ankle which she injured when she was a child.  Overall, her performance status is ECOG 0.              Past Medical History  Diagnosis Date  . Rectal mass 02/20/15    Rectal/sigmoid mass  :  Past Surgical History  Procedure Laterality Date  . Flexible sigmoidoscopy N/A 02/15/2015    Procedure: FLEXIBLE SIGMOIDOSCOPY;  Surgeon: [name], MD;  Location: [facility] ENDOSCOPY;  Service: Endoscopy;  Laterality: N/A;  . Appendectomy  02/20/15    Abnormal appearing  . Laparoscopy abdomen diagnostic  02/20/15    Converted to open - Sigmoid/rectal mass  . Low anterior bowel resection  02/20/15    Sigmoid/rectal mass  . Colostomy  02/20/15    Sigmoid/rectal mass  . Exploratory laparotomy  02/20/15    Sigmoid/rectal mass  . Laparoscopy N/A 02/20/2015    Procedure: LAPAROSCOPY DIAGNOSTIC;  Surgeon: [name], MD;  Location: [facility];  Service: General;  Laterality: N/A;  . Laparotomy N/A 02/20/2015    Procedure: EXPLORATORY LAPAROTOMY AND PARTIAL COLECTOMY;  Surgeon: [name], MD;  Location: [facility];  Service: General;  Laterality:  N/A;  . Colostomy N/A 02/20/2015    Procedure: COLOSTOMY;  Surgeon: [name], MD;  Location: [facility];  Service: General;  Laterality: N/A;  :   Current outpatient prescriptions:  .  fexofenadine (ALLEGRA) 180 MG tablet, Take 180 mg by mouth daily as needed for allergies. , Disp: , Rfl:  .  fluticasone (FLONASE) 50 MCG/ACT nasal spray, Place 2 sprays into the nose daily., Disp: 16 g, Rfl: 12 .  ibuprofen (ADVIL,MOTRIN) 200 MG tablet, Take 400 mg by mouth every 6 (six)  hours as needed for headache or mild pain., Disp: , Rfl:  .  Pseudoephedrine HCl (PSEUDO PO), Take 2 tablets by mouth daily as needed (congestion)., Disp: , Rfl: :  :  Allergies  Allergen Reactions  . Oxaprozin Hives    REACTION: Hives  . Penicillins Hives    REACTION: Hives  . Sulfonamide Derivatives Swelling    Massive extremity swelling   :  No family history on file.:  Social History   Social History  . Marital Status: Married    Spouse Name: N/A  . Number of Children: N/A  . Years of Education: N/A   Occupational History  . Not on file.   Social History Main Topics  . Smoking status: Never Smoker   . Smokeless tobacco: Not on file  . Alcohol Use: 0.0 oz/week    0 Standard drinks or equivalent per week  . Drug Use: Not on file  . Sexual Activity: Not on file   Other Topics Concern  . Not on file   Social History Narrative  :  Pertinent items are noted in HPI.  Exam: _0 @  well-developed and well-nourished white female in no obvious distress. Vital signs show temperature of 97.3. Pulse 77. Blood pressure 101/68 and weight is 131 pounds. Head and neck exam shows no ocular or oral lesions. She has no palpable cervical or supraclavicular lymph nodes. Lungs are clear to percussion and auscultation bilaterally. Cardiac exam regular rate and rhythm with no murmurs, rubs or bruits. Abdomen is soft. She has good bowel sounds. She has the well-healed laparoscopy scars. She has colostomy in the left lower quadrant. There is no palpable liver or spleen tip. Back exam shows no tenderness over the spine, ribs or hips. Extremity shows no clubbing, cyanosis or edema. Neurological exam shows no focal neurological deficit. Skin exam shows no rashes, ecchymoses or petechia.    Recent Labs  03/09/15 1007  WBC 9.4  HGB 10.3*  HCT 32.5*  PLT 652*   No results for input(s): NA, K, CL, CO2, GLUCOSE, BUN, CREATININE, CALCIUM in the last 72 hours.  Blood smear review:   None  Pathology: As above     Assessment and Plan:  Carla Mills is a very charming 44 year old white female. She has recently diagnosed stage IIb adenocarcinoma of the sigmoid colon. I am very, very impressed with the work of Dr. Rosendo Gros in which 43 lymph nodes were obtained and all were negative for malignancy.  I think from a clinical point of view, I just don't see that there would be any defined benefit for adjuvant chemotherapy for Ms. Lodato. I think that the 43 lymph nodes is a very prognostic factor. In addition, there is no lymphovascular space invasion and no perineural invasion. All margins were negative. This was not a high-grade tumor.  I think the only negative would be the perforation which appear to be localized.  I would want to get an Oncotype assay for her  tumor. I did this would be quite reasonable. I did this could help [place] in deciding as to definitively know whether she would benefit from adjuvant chemotherapy.  I would think that her risk of recurrence is going to be less than 15%.  However, with the Oncotype assay, this concerning help [place] further define whether or not she would benefit from adjuvant chemotherapy.  I spent about 60 minutes with she and her husband. There are really nice to talk to. They're from [place]. At a good time talking to them about [place].  If we do not need adjuvant chemotherapy, and I probably would follow her up in about 2 months or so and repeat a scan. I think follow-up CT scan would help with respect to identifying any recurrence.

## 2015-03-10 ENCOUNTER — Other Ambulatory Visit: Payer: Self-pay | Admitting: *Deleted

## 2015-03-10 ENCOUNTER — Telehealth: Payer: Self-pay | Admitting: *Deleted

## 2015-03-10 DIAGNOSIS — C187 Malignant neoplasm of sigmoid colon: Secondary | ICD-10-CM

## 2015-03-10 LAB — CEA: CEA: <0.5 ng/mL (ref 0.0–5.0)

## 2015-03-10 NOTE — Telephone Encounter (Signed)
Made appointment for 03/14/2015

## 2015-03-10 NOTE — Telephone Encounter (Signed)
-----   Message from Volanda Napoleon, MD sent at 03/09/2015  5:59 PM EST ----- Call - your iron level is still low!!  You will benefit from 1 dose of IV iron.  Please set this up next week. Thanks!!!

## 2015-03-14 ENCOUNTER — Ambulatory Visit (HOSPITAL_BASED_OUTPATIENT_CLINIC_OR_DEPARTMENT_OTHER): Payer: BLUE CROSS/BLUE SHIELD

## 2015-03-14 VITALS — BP 110/60 | HR 66 | Temp 97.5°F | Resp 18

## 2015-03-14 DIAGNOSIS — E611 Iron deficiency: Secondary | ICD-10-CM

## 2015-03-14 DIAGNOSIS — C187 Malignant neoplasm of sigmoid colon: Secondary | ICD-10-CM

## 2015-03-14 MED ORDER — SODIUM CHLORIDE 0.9 % IV SOLN
Freq: Once | INTRAVENOUS | Status: AC
  Administered 2015-03-14: 10:00:00 via INTRAVENOUS

## 2015-03-14 MED ORDER — SODIUM CHLORIDE 0.9 % IV SOLN
510.0000 mg | Freq: Once | INTRAVENOUS | Status: AC
  Administered 2015-03-14: 510 mg via INTRAVENOUS
  Filled 2015-03-14: qty 17

## 2015-03-14 NOTE — Patient Instructions (Signed)

## 2015-03-24 ENCOUNTER — Encounter (HOSPITAL_COMMUNITY): Payer: Self-pay

## 2015-03-24 ENCOUNTER — Other Ambulatory Visit: Payer: Self-pay | Admitting: General Surgery

## 2015-03-27 ENCOUNTER — Encounter: Payer: Self-pay | Admitting: Hematology & Oncology

## 2015-03-29 ENCOUNTER — Other Ambulatory Visit: Payer: Self-pay | Admitting: Hematology & Oncology

## 2015-03-29 ENCOUNTER — Telehealth: Payer: Self-pay | Admitting: Hematology & Oncology

## 2015-03-29 ENCOUNTER — Other Ambulatory Visit: Payer: Self-pay | Admitting: General Surgery

## 2015-03-29 DIAGNOSIS — Z933 Colostomy status: Secondary | ICD-10-CM

## 2015-03-29 NOTE — Telephone Encounter (Signed)
Called pt and l/m stating that she has an appt on Monday, 04/03/2015 at 5pm per Dr. Marin Olp.       [facility].

## 2015-03-31 ENCOUNTER — Other Ambulatory Visit: Payer: Self-pay | Admitting: *Deleted

## 2015-03-31 ENCOUNTER — Ambulatory Visit (HOSPITAL_BASED_OUTPATIENT_CLINIC_OR_DEPARTMENT_OTHER): Payer: BLUE CROSS/BLUE SHIELD | Admitting: Hematology & Oncology

## 2015-03-31 ENCOUNTER — Encounter: Payer: Self-pay | Admitting: Hematology & Oncology

## 2015-03-31 VITALS — BP 116/70 | HR 73 | Temp 97.4°F | Resp 16 | Ht 65.0 in | Wt 135.0 lb

## 2015-03-31 DIAGNOSIS — D509 Iron deficiency anemia, unspecified: Secondary | ICD-10-CM | POA: Diagnosis not present

## 2015-03-31 DIAGNOSIS — C187 Malignant neoplasm of sigmoid colon: Secondary | ICD-10-CM

## 2015-03-31 MED ORDER — ONDANSETRON 8 MG PO TBDP
ORAL_TABLET | ORAL | Status: DC
Start: 2015-03-31 — End: 2015-10-31

## 2015-03-31 MED ORDER — CAPECITABINE 500 MG PO TABS: ORAL_TABLET | ORAL | Status: DC

## 2015-03-31 MED ORDER — PROCHLORPERAZINE MALEATE 10 MG PO TABS: 10.0000 mg | ORAL_TABLET | Freq: Four times a day (QID) | ORAL | Status: DC | PRN

## 2015-03-31 MED FILL — PROCHLORPERAZINE 10 MG TAB: 10 | 15 days supply | Qty: 60 | Fill #0

## 2015-03-31 MED FILL — ONDANSETRON ODT 8 MG TABLET: 8 | 30 days supply | Qty: 30 | Fill #0

## 2015-03-31 NOTE — Progress Notes (Signed)
Hematology and Oncology Follow Up Visit  Hakima Lindle [id number] 24-Oct-1970 45 y.o. 03/31/2015   Principle Diagnosis:   High risk stage IIB (T4N0M0) adenocarcinoma of the sigmoid colon -- Oncotype = 37  Current Therapy:    Patient to start adjuvant Xeloda     Interim History:  Ms. Trest is back for follow-up. We went ahead and did get a Oncotype score on her. Surprisingly enough, her Oncotype scores quite high at 37. This indicated that her risk of recurrence after surgery was 29%. In my mind, this is way too high. As such, I felt that she would be a good candidate for adjuvant chemotherapy.  With adjuvant chemotherapy with Xeloda alone, her risk of recurrence goes down to 13% which is much more reasonable.  She has a colostomy. She is a little upset that the colostomy will not be able to be reversed until after the chemotherapy is finished. If we did go ahead and give her some IV iron. She has minor deficiency. She does feel a little bit better.  She's had no problems with cough. The been no bone pain. She's had no leg swelling. She's had no rashes.  Overall, her performance status is ECOG 0.  Medications:  Current outpatient prescriptions:  .  fexofenadine (ALLEGRA) 180 MG tablet, Take 180 mg by mouth daily as needed for allergies. , Disp: , Rfl:  .  fluticasone (FLONASE) 50 MCG/ACT nasal spray, Place 2 sprays into the nose daily., Disp: 16 g, Rfl: 12 .  ibuprofen (ADVIL,MOTRIN) 200 MG tablet, Take 400 mg by mouth every 6 (six) hours as needed for headache or mild pain., Disp: , Rfl:  .  Pseudoephedrine HCl (PSEUDO PO), Take 2 tablets by mouth daily as needed (congestion)., Disp: , Rfl:  .  capecitabine (XELODA) 500 MG tablet, Take 4 pills twice a day with food for 14 days, then off for 7 days., Disp: 112 tablet, Rfl: 8 .  ondansetron (ZOFRAN ODT) 8 MG disintegrating tablet, Take 1 pill 30 minutes before chemotherapy, Disp: 30 tablet, Rfl: 4 .  prochlorperazine (COMPAZINE) 10  MG tablet, Take 1 tablet (10 mg total) by mouth every 6 (six) hours as needed for nausea or vomiting., Disp: 60 tablet, Rfl: 5  Allergies:  Allergies  Allergen Reactions  . Oxaprozin Hives    REACTION: Hives  . Penicillins Hives    REACTION: Hives  . Sulfonamide Derivatives Swelling    Massive extremity swelling     Past Medical History, Surgical history, Social history, and Family History were reviewed and updated.  Review of Systems: As above  Physical Exam:  height is 5\' 5"  (1.651 m) and weight is 135 lb (61.236 kg). Her oral temperature is 97.4 F (36.3 C). Her blood pressure is 116/70 and her pulse is 73. Her respiration is 16.   Wt Readings from Last 3 Encounters:  03/31/15 135 lb (61.236 kg)  03/09/15 131 lb (59.421 kg)  02/16/15 145 lb 14.4 oz (66.18 kg)     Head and neck exam shows no ocular or oral lesions. She has no palpable cervical or supraclavicular lymph nodes. Lungs are clear to percussion and auscultation bilaterally. Cardiac exam regular rate and rhythm with no murmurs, rubs or bruits. Abdomen is soft. She has good bowel sounds. She has the well-healed laparoscopy scars. She has colostomy in the left lower quadrant. There is no palpable liver or spleen tip. Back exam shows no tenderness over the spine, ribs or hips. Extremity shows no clubbing, cyanosis or  edema. Neurological exam shows no focal neurological deficit. Skin exam shows no rashes, ecchymoses or petechia.   Lab Results  Component Value Date   WBC 9.4 03/09/2015   HGB 10.3* 03/09/2015   HCT 32.5* 03/09/2015   MCV 82 03/09/2015   PLT 652* 03/09/2015     Chemistry      Component Value Date/Time   NA 140 03/09/2015 1008   NA 139 02/22/2015 0425   K 3.7 03/09/2015 1008   K 4.0 02/22/2015 0425   CL 106 02/22/2015 0425   CO2 25 03/09/2015 1008   CO2 29 02/22/2015 0425   BUN 11.4 03/09/2015 1008   BUN <5* 02/22/2015 0425   CREATININE 0.8 03/09/2015 1008   CREATININE 0.45 02/22/2015 0425        Component Value Date/Time   CALCIUM 9.1 03/09/2015 1008   CALCIUM 8.3* 02/22/2015 0425   ALKPHOS 67 03/09/2015 1008   ALKPHOS 76 02/10/2015 1703   AST 14 03/09/2015 1008   AST 15 02/10/2015 1703   ALT 11 03/09/2015 1008   ALT 14 02/10/2015 1703   BILITOT 0.35 03/09/2015 1008   BILITOT 0.3 02/10/2015 1703         Impression and Plan: Ms. Irizarry is a 45 year old white female. She has a stage IIB adenocarcinoma of the sigmoid colon.again, her octaves score is a lot higher than I thought it would be but we have to acknowledge this and treat her in the adjuvant setting.  I think that single agent Xeloda would be appropriate for her.  I went over the side effects of Xeloda. I told her about the possibility of diarrhea, mouth sores, skin rash, and palmar/plantar erythema. I don't think she'll lose her hair. She may have a little bit of nausea. She may have some fatigue.  She understands all this. She agrees to take treatment. I gave her copy of the Oncotype scores.  Her dose will be 2000 mg twice a day for 14 days on and 7 days off. I think that 8 cycles of treatment would be appropriate.  I will let her surgeon know that we have to treat her and I'm sure he will put off the scheduling of the colostomy reversal until the summertime.  I spent 45 minutes with she and her husband. I answered all their questions. I explained why I thought that adjuvant therapy would benefit her. She understands this well.  Volanda Napoleon, MD 1/6/20173:21 PM

## 2015-04-03 ENCOUNTER — Ambulatory Visit: Payer: BLUE CROSS/BLUE SHIELD | Admitting: Hematology & Oncology

## 2015-04-10 ENCOUNTER — Other Ambulatory Visit: Payer: Self-pay

## 2015-04-10 ENCOUNTER — Other Ambulatory Visit: Payer: BLUE CROSS/BLUE SHIELD

## 2015-04-10 DIAGNOSIS — Z1231 Encounter for screening mammogram for malignant neoplasm of breast: Secondary | ICD-10-CM

## 2015-04-11 ENCOUNTER — Emergency Department
Admission: EM | Admit: 2015-04-11 | Discharge: 2015-04-11 | Disposition: A | Discharge status: Home / Self Care | Payer: BLUE CROSS/BLUE SHIELD | Source: Home / Self Care | Attending: Family Medicine | Admitting: Family Medicine

## 2015-04-11 ENCOUNTER — Encounter: Payer: Self-pay | Admitting: *Deleted

## 2015-04-11 DIAGNOSIS — H6983 Other specified disorders of Eustachian tube, bilateral: Secondary | ICD-10-CM

## 2015-04-11 MED ORDER — CEFDINIR 300 MG PO CAPS: 300.0000 mg | ORAL_CAPSULE | Freq: Two times a day (BID) | ORAL | Status: DC

## 2015-04-11 MED ORDER — PREDNISONE 20 MG PO TABS: 20.0000 mg | ORAL_TABLET | Freq: Two times a day (BID) | ORAL | Status: DC

## 2015-04-11 NOTE — ED Notes (Signed)
Pt c/o bilateral ear fullness x 1 wk. Denies fever or recent URI. She has taken Allegra and Flonase with some relief.

## 2015-04-11 NOTE — Discharge Instructions (Signed)
Begin Pseudoephedrine (30mg , one or two every 4 to 6 hours) for sinus congestion.    May use Afrin nasal spray (or generic oxymetazoline) twice daily for about 5 days and then discontinue.  Also recommend using saline nasal spray several times daily and saline nasal irrigation (AYR is a common brand).  Use Flonase nasal spray each morning after using Afrin nasal spray and saline nasal irrigation. Stop all antihistamines for now, and other non-prescription cough/cold preparations.

## 2015-04-11 NOTE — ED Provider Notes (Signed)
CSN: [id number]     Arrival date & time 04/11/15  0816 History   First MD Initiated Contact with Patient 04/11/15 [phone]     Chief Complaint  Patient presents with  . Ear Fullness     HPI Comments: Patient reports that her ears have felt intermittently clogged for about a week (she has had difficulty equalizing pressure in her ears), and yesterday both ears felt completely full.  She denies earache.  No sinus congestion.  No fevers, chills, and sweats   The history is provided by the patient.    Past Medical History  Diagnosis Date  . Rectal mass 02/20/15    Rectal/sigmoid mass  . Colonic cancer ([place]) 03/09/2015   Past Surgical History  Procedure Laterality Date  . Flexible sigmoidoscopy N/A 02/15/2015    Procedure: FLEXIBLE SIGMOIDOSCOPY;  Surgeon: [name], MD;  Location: [facility] ENDOSCOPY;  Service: Endoscopy;  Laterality: N/A;  . Appendectomy  02/20/15    Abnormal appearing  . Laparoscopy abdomen diagnostic  02/20/15    Converted to open - Sigmoid/rectal mass  . Low anterior bowel resection  02/20/15    Sigmoid/rectal mass  . Colostomy  02/20/15    Sigmoid/rectal mass  . Exploratory laparotomy  02/20/15    Sigmoid/rectal mass  . Laparoscopy N/A 02/20/2015    Procedure: LAPAROSCOPY DIAGNOSTIC;  Surgeon: [name], MD;  Location: [place] [facility];  Service: General;  Laterality: N/A;  . Laparotomy N/A 02/20/2015    Procedure: EXPLORATORY LAPAROTOMY AND PARTIAL COLECTOMY;  Surgeon: [name], MD;  Location: [place];  Service: General;  Laterality: N/A;  . Colostomy N/A 02/20/2015    Procedure: COLOSTOMY;  Surgeon: [name], MD;  Location: [place];  Service: General;  Laterality: N/A;   History reviewed. No pertinent family history. Social History  Substance Use Topics  . Smoking status: Never Smoker   . Smokeless tobacco: None  . Alcohol Use: 0.0 oz/week    0 Standard drinks or equivalent per week   OB History    No data available     Review of Systems No sore  throat No cough No pleuritic pain No wheezing ? nasal congestion No post-nasal drainage No sinus pain/pressure No itchy/red eyes ? earache No hemoptysis No SOB No fever/chills No nausea No vomiting No abdominal pain No diarrhea No urinary symptoms No skin rash No fatigue No myalgias No headache Used OTC meds without relief  Allergies  Bactrim; Oxaprozin; Penicillins; and Sulfonamide derivatives  Home Medications   Prior to Admission medications   Medication Sig Start Date End Date Taking? Authorizing Provider  capecitabine (XELODA) 500 MG tablet Take 4 pills twice a day with food for 14 days, then off for 7 days. 03/31/15   Volanda Napoleon, MD  cefdinir (OMNICEF) 300 MG capsule Take 1 capsule (300 mg total) by mouth 2 (two) times daily. 04/11/15   Kandra Nicolas, MD  fexofenadine (ALLEGRA) 180 MG tablet Take 180 mg by mouth daily as needed for allergies.     Historical Provider, MD  fluticasone (FLONASE) 50 MCG/ACT nasal spray Place 2 sprays into the nose daily. 06/26/12   Deneise Lever, MD  ibuprofen (ADVIL,MOTRIN) 200 MG tablet Take 400 mg by mouth every 6 (six) hours as needed for headache or mild pain.    Historical Provider, MD  ondansetron (ZOFRAN ODT) 8 MG disintegrating tablet Take 1 pill 30 minutes before chemotherapy 03/31/15   Volanda Napoleon, MD  predniSONE (DELTASONE) 20 MG tablet Take 1 tablet (  20 mg total) by mouth 2 (two) times daily. Take with food. 04/11/15   Kandra Nicolas, MD  prochlorperazine (COMPAZINE) 10 MG tablet Take 1 tablet (10 mg total) by mouth every 6 (six) hours as needed for nausea or vomiting. 03/31/15   Volanda Napoleon, MD  Pseudoephedrine HCl (PSEUDO PO) Take 2 tablets by mouth daily as needed (congestion).    Historical Provider, MD   Meds Ordered and Administered this Visit  Medications - No data to display  BP 121/85 mmHg  Pulse 91  Temp(Src) 97.7 F (36.5 C) (Oral)  Resp 16  Ht 5' 5.5" (1.664 m)  Wt 137 lb (62.143 kg)  BMI 22.44 kg/m2   SpO2 100% No data found.   Physical Exam Nursing notes and Vital Signs reviewed. Appearance:  Patient appears stated age, and in no acute distress Eyes:  Pupils are equal, round, and reactive to light and accomodation.  Extraocular movement is intact.  Conjunctivae are not inflamed  Ears:  Canals normal.  Tympanic membranes normal.  Nose:  Mildly congested turbinates.  No sinus tenderness.   Pharynx:  Normal Neck:  Supple.  No adenopathy  Skin:  No rash present.   ED Course  Procedures  None  Labs Reviewed -   Tympanogram:  Left ear low peak height ("flat line"); Right ear normal   MDM   1. Eustachian tube dysfunction, bilateral; ?otitis media left   Begin prednisone burst, and Omnicef 300mg  BID (has taken similar drugs in class without adverse effect) Begin Pseudoephedrine (30mg , one or two every 4 to 6 hours) for sinus congestion.    May use Afrin nasal spray (or generic oxymetazoline) twice daily for about 5 days and then discontinue.  Also recommend using saline nasal spray several times daily and saline nasal irrigation (AYR is a common brand).  Use Flonase nasal spray each morning after using Afrin nasal spray and saline nasal irrigation. Stop all antihistamines for now, and other non-prescription cough/cold preparations. Followup with ENT if not improved in 10 days.    Kandra Nicolas, MD 04/11/15 [phone]

## 2015-04-20 ENCOUNTER — Encounter: Payer: Self-pay | Admitting: Hematology & Oncology

## 2015-04-20 ENCOUNTER — Ambulatory Visit (HOSPITAL_BASED_OUTPATIENT_CLINIC_OR_DEPARTMENT_OTHER): Payer: BLUE CROSS/BLUE SHIELD | Admitting: Hematology & Oncology

## 2015-04-20 ENCOUNTER — Other Ambulatory Visit (HOSPITAL_BASED_OUTPATIENT_CLINIC_OR_DEPARTMENT_OTHER): Payer: BLUE CROSS/BLUE SHIELD

## 2015-04-20 VITALS — BP 111/73 | HR 67 | Temp 97.5°F | Resp 18 | Ht 65.0 in | Wt 135.0 lb

## 2015-04-20 DIAGNOSIS — C187 Malignant neoplasm of sigmoid colon: Secondary | ICD-10-CM

## 2015-04-20 DIAGNOSIS — D509 Iron deficiency anemia, unspecified: Secondary | ICD-10-CM

## 2015-04-20 LAB — COMPREHENSIVE METABOLIC PANEL
ALT: 13 U/L (ref 0–55)
ANION GAP: 10 meq/L (ref 3–11)
AST: 13 U/L (ref 5–34)
Albumin: 4.6 g/dL (ref 3.5–5.0)
Alkaline Phosphatase: 65 U/L (ref 40–150)
BUN: 13.1 mg/dL (ref 7.0–26.0)
CHLORIDE: 106 meq/L (ref 98–109)
CO2: 26 meq/L (ref 22–29)
CREATININE: 0.8 mg/dL (ref 0.6–1.1)
Calcium: 9.5 mg/dL (ref 8.4–10.4)
EGFR: >90 mL/min/{1.73_m2} (ref >90)
Glucose: 92 mg/dl (ref 70–140)
POTASSIUM: 4.3 meq/L (ref 3.5–5.1)
Sodium: 142 mEq/L (ref 136–145)
Total Bilirubin: 0.7 mg/dL (ref 0.20–1.20)
Total Protein: 7.6 g/dL (ref 6.4–8.3)

## 2015-04-20 LAB — CBC WITH DIFFERENTIAL (CANCER CENTER ONLY)
BASO#: 0 10*3/uL (ref 0.0–0.2)
BASO%: 0.5 % (ref 0.0–2.0)
EOS%: 3 % (ref 0.0–7.0)
Eosinophils Absolute: 0.3 10*3/uL (ref 0.0–0.5)
HCT: 42.3 % (ref 34.8–46.6)
HGB: 14.4 g/dL (ref 11.6–15.9)
LYMPH#: 2.3 10*3/uL (ref 0.9–3.3)
LYMPH%: 26.9 % (ref 14.0–48.0)
MCH: 29.2 pg (ref 26.0–34.0)
MCHC: 34 g/dL (ref 32.0–36.0)
MCV: 86 fL (ref 81–101)
MONO#: 0.5 10*3/uL (ref 0.1–0.9)
MONO%: 5.6 % (ref 0.0–13.0)
NEUT#: 5.6 10*3/uL (ref 1.5–6.5)
NEUT%: 64 % (ref 39.6–80.0)
PLATELETS: 324 10*3/uL (ref 145–400)
RBC: 4.93 10*6/uL (ref 3.70–5.32)
RDW: 18.2 % — AB (ref 11.1–15.7)
WBC: 8.7 10*3/uL (ref 3.9–10.0)

## 2015-04-20 NOTE — Progress Notes (Signed)
Hematology and Oncology Follow Up Visit  Carla Mills [id number] 1970-08-08 44 y.o. 04/20/2015   Principle Diagnosis:   High risk stage IIB (T4N0M0) adenocarcinoma of the sigmoid colon -- Oncotype = 37  Current Therapy:    S/p cycle #1 of adjuvant Xeloda     Interim History:  Carla Mills is back for follow-up. She looks really good. She tolerated her first cycle of Xeloda quite nicely. She had no problems with hand-foot syndrome. Her colostomy was not well productive of diarrhea. She had no mouth sores. She is rinsing her mouth out 3-4 times a day. She's had no nausea or vomiting. She's not taken any nausea medicines.  She has not noted any rashes [place] that on the upper chest and neck. I told her to try some aloe vera lotion to help with these nodules. This is from the Xeloda.  She's had no fever.  There's been no bleeding.  Overall, her performance status is ECOG 0.  Medications:  Current outpatient prescriptions:  .  capecitabine (XELODA) 500 MG tablet, Take 4 pills twice a day with food for 14 days, then off for 7 days., Disp: 112 tablet, Rfl: 8 .  cefdinir (OMNICEF) 300 MG capsule, Take 1 capsule (300 mg total) by mouth 2 (two) times daily., Disp: 20 capsule, Rfl: 0 .  fexofenadine (ALLEGRA) 180 MG tablet, Take 180 mg by mouth daily as needed for allergies. , Disp: , Rfl:  .  fluticasone (FLONASE) 50 MCG/ACT nasal spray, Place 2 sprays into the nose daily., Disp: 16 g, Rfl: 12 .  ibuprofen (ADVIL,MOTRIN) 200 MG tablet, Take 400 mg by mouth every 6 (six) hours as needed for headache or mild pain., Disp: , Rfl:  .  ondansetron (ZOFRAN ODT) 8 MG disintegrating tablet, Take 1 pill 30 minutes before chemotherapy, Disp: 30 tablet, Rfl: 4 .  predniSONE (DELTASONE) 20 MG tablet, Take 1 tablet (20 mg total) by mouth 2 (two) times daily. Take with food., Disp: 10 tablet, Rfl: 0 .  prochlorperazine (COMPAZINE) 10 MG tablet, Take 1 tablet (10 mg total) by mouth every 6 (six) hours as  needed for nausea or vomiting., Disp: 60 tablet, Rfl: 5 .  Pseudoephedrine HCl (PSEUDO PO), Take 2 tablets by mouth daily as needed (congestion)., Disp: , Rfl:   Allergies:  Allergies  Allergen Reactions  . Bactrim [Sulfamethoxazole-Trimethoprim]   . Oxaprozin Hives    REACTION: Hives  . Penicillins Hives    REACTION: Hives  . Sulfonamide Derivatives Swelling    Massive extremity swelling     Past Medical History, Surgical history, Social history, and Family History were reviewed and updated.  Review of Systems: As above  Physical Exam:  height is 5\' 5"  (1.651 m) and weight is 135 lb (61.236 kg). Her oral temperature is 97.5 F (36.4 C). Her blood pressure is 111/73 and her pulse is 67. Her respiration is 18.   Wt Readings from Last 3 Encounters:  04/20/15 135 lb (61.236 kg)  04/11/15 137 lb (62.143 kg)  03/31/15 135 lb (61.236 kg)     Head and neck exam shows no ocular or oral lesions. She has no palpable cervical or supraclavicular lymph nodes. Lungs are clear to percussion and auscultation bilaterally. Cardiac exam regular rate and rhythm with no murmurs, rubs or bruits. Abdomen is soft. She has good bowel sounds. She has the well-healed laparoscopy scars. She has colostomy in the left lower quadrant. There is no palpable liver or spleen tip. Back exam shows no tenderness  over the spine, ribs or hips. Extremity shows no clubbing, cyanosis or edema. Neurological exam shows no focal neurological deficit. Skin exam shows no rashes, ecchymoses or petechia.   Lab Results  Component Value Date   WBC 8.7 04/20/2015   HGB 14.4 04/20/2015   HCT 42.3 04/20/2015   MCV 86 04/20/2015   PLT 324 04/20/2015     Chemistry      Component Value Date/Time   NA 140 03/09/2015 1008   NA 139 02/22/2015 0425   K 3.7 03/09/2015 1008   K 4.0 02/22/2015 0425   CL 106 02/22/2015 0425   CO2 25 03/09/2015 1008   CO2 29 02/22/2015 0425   BUN 11.4 03/09/2015 1008   BUN <5* 02/22/2015 0425    CREATININE 0.8 03/09/2015 1008   CREATININE 0.45 02/22/2015 0425      Component Value Date/Time   CALCIUM 9.1 03/09/2015 1008   CALCIUM 8.3* 02/22/2015 0425   ALKPHOS 67 03/09/2015 1008   ALKPHOS 76 02/10/2015 1703   AST 14 03/09/2015 1008   AST 15 02/10/2015 1703   ALT 11 03/09/2015 1008   ALT 14 02/10/2015 1703   BILITOT 0.35 03/09/2015 1008   BILITOT 0.3 02/10/2015 1703         Impression and Plan: Carla Mills is a 45 year old white female. She has a stage IIB adenocarcinoma of the sigmoid colon.again, her Oncotype score is a lot higher than I thought it would be.  I'm so glad that she has tolerated her first cycle treatment well.  I will give her told of 8 cycles.  I don't he went to make any kind of dosage adjustments.  I will plan to get her back to see [place] in another 3 weeks.  I told her that effects of Xeloda might be he was him so we will have to watch out for that.   Volanda Napoleon, MD 1/26/201712:27 PM

## 2015-04-21 LAB — IRON AND TIBC
%SAT: 20 % — AB (ref 21–57)
Iron: 68 ug/dL (ref 41–142)
TIBC: 331 ug/dL (ref 236–444)
UIBC: 264 ug/dL (ref 120–384)

## 2015-04-21 LAB — FERRITIN: Ferritin: 53 ng/ml (ref 9–269)

## 2015-04-21 NOTE — Telephone Encounter (Addendum)
-----   Message from Volanda Napoleon, MD sent at 04/21/2015  9:41 AM EST ----- Call - iron is better!!!  [facility] with patient, verbalized understanding.

## 2015-04-27 ENCOUNTER — Ambulatory Visit: Payer: BLUE CROSS/BLUE SHIELD | Admitting: Hematology & Oncology

## 2015-05-05 ENCOUNTER — Ambulatory Visit
Admission: RE | Admit: 2015-05-05 | Discharge: 2015-05-05 | Disposition: A | Discharge status: Home / Self Care | Payer: BLUE CROSS/BLUE SHIELD | Source: Ambulatory Visit

## 2015-05-05 DIAGNOSIS — Z1231 Encounter for screening mammogram for malignant neoplasm of breast: Secondary | ICD-10-CM

## 2015-05-11 ENCOUNTER — Ambulatory Visit: Payer: BLUE CROSS/BLUE SHIELD | Admitting: Hematology & Oncology

## 2015-05-11 ENCOUNTER — Other Ambulatory Visit: Payer: BLUE CROSS/BLUE SHIELD

## 2015-05-12 ENCOUNTER — Encounter: Payer: Self-pay | Admitting: Hematology & Oncology

## 2015-05-12 ENCOUNTER — Other Ambulatory Visit (HOSPITAL_BASED_OUTPATIENT_CLINIC_OR_DEPARTMENT_OTHER): Payer: BLUE CROSS/BLUE SHIELD

## 2015-05-12 ENCOUNTER — Ambulatory Visit (HOSPITAL_BASED_OUTPATIENT_CLINIC_OR_DEPARTMENT_OTHER): Payer: BLUE CROSS/BLUE SHIELD | Admitting: Hematology & Oncology

## 2015-05-12 VITALS — BP 119/63 | HR 72 | Temp 97.7°F | Resp 16 | Ht 65.0 in | Wt 139.0 lb

## 2015-05-12 DIAGNOSIS — R197 Diarrhea, unspecified: Secondary | ICD-10-CM | POA: Diagnosis not present

## 2015-05-12 DIAGNOSIS — C187 Malignant neoplasm of sigmoid colon: Secondary | ICD-10-CM

## 2015-05-12 DIAGNOSIS — R21 Rash and other nonspecific skin eruption: Secondary | ICD-10-CM | POA: Diagnosis not present

## 2015-05-12 DIAGNOSIS — D509 Iron deficiency anemia, unspecified: Secondary | ICD-10-CM

## 2015-05-12 DIAGNOSIS — C186 Malignant neoplasm of descending colon: Secondary | ICD-10-CM

## 2015-05-12 LAB — CBC WITH DIFFERENTIAL (CANCER CENTER ONLY)
BASO#: 0 10*3/uL (ref 0.0–0.2)
BASO%: 0.3 % (ref 0.0–2.0)
EOS%: 2.7 % (ref 0.0–7.0)
Eosinophils Absolute: 0.2 10*3/uL (ref 0.0–0.5)
HEMATOCRIT: 38.4 % (ref 34.8–46.6)
HEMOGLOBIN: 13 g/dL (ref 11.6–15.9)
LYMPH#: 2 10*3/uL (ref 0.9–3.3)
LYMPH%: 32.6 % (ref 14.0–48.0)
MCH: 30 pg (ref 26.0–34.0)
MCHC: 33.9 g/dL (ref 32.0–36.0)
MCV: 89 fL (ref 81–101)
MONO#: 0.5 10*3/uL (ref 0.1–0.9)
MONO%: 7.3 % (ref 0.0–13.0)
NEUT%: 57.1 % (ref 39.6–80.0)
NEUTROS ABS: 3.6 10*3/uL (ref 1.5–6.5)
Platelets: 296 10*3/uL (ref 145–400)
RBC: 4.34 10*6/uL (ref 3.70–5.32)
RDW: 17.7 % — ABNORMAL HIGH (ref 11.1–15.7)
WBC: 6.3 10*3/uL (ref 3.9–10.0)

## 2015-05-12 LAB — CMP (CANCER CENTER ONLY)
ALBUMIN: 3.7 g/dL (ref 3.3–5.5)
ALK PHOS: 48 U/L (ref 26–84)
ALT: 36 U/L (ref 10–47)
AST: 30 U/L (ref 11–38)
BILIRUBIN TOTAL: 0.8 mg/dL (ref 0.20–1.60)
BUN, Bld: 14 mg/dL (ref 7–22)
CO2: 30 meq/L (ref 18–33)
CREATININE: 0.8 mg/dL (ref 0.6–1.2)
Calcium: 9.3 mg/dL (ref 8.0–10.3)
Chloride: 104 mEq/L (ref 98–108)
GLUCOSE: 100 mg/dL (ref 73–118)
Potassium: 4.5 mEq/L (ref 3.3–4.7)
SODIUM: 139 meq/L (ref 128–145)
Total Protein: 6.6 g/dL (ref 6.4–8.1)

## 2015-05-12 NOTE — Progress Notes (Signed)
Hematology and Oncology Follow Up Visit  Carla Mills [id number] June 12, 1970 45 y.o. 05/12/2015   Principle Diagnosis:   High risk stage IIB (T4N0M0) adenocarcinoma of the sigmoid colon -- Oncotype = 37  Current Therapy:    S/p cycle #2 of adjuvant Xeloda     Interim History:  [name] is back for follow-up. She looks really good. She tolerated her first 2 cycles of Xeloda quite nicely. She had no problems with hand-foot syndrome. Her hands do look little bit more red. She says that they have a little bit more increased sensation. There has not been any peeling.   Her colostomy is doing okay. She still has some diarrhea. Of these changes the colostomy bag every 3 days.. She had no mouth sores. She is rinsing her mouth out 2 times a day. She's had no nausea or vomiting. She's not taken any nausea medicines.  She has  noted any rashes [place] that on the upper chest and neck. I told her to try some aloe vera lotion to help with these nodules. This is from the Xeloda. I told her to make sure that she wears sunscreen when she is outside with the warmer weather.  She's had no fever.  There's been no bleeding.  Overall, her performance status is ECOG 0.  Medications:  Current outpatient prescriptions:  .  capecitabine (XELODA) 500 MG tablet, Take 4 pills twice a day with food for 14 days, then off for 7 days., Disp: 112 tablet, Rfl: 8 .  fexofenadine (ALLEGRA) 180 MG tablet, Take 180 mg by mouth daily as needed for allergies. , Disp: , Rfl:  .  fluticasone (FLONASE) 50 MCG/ACT nasal spray, Place 2 sprays into the nose daily., Disp: 16 g, Rfl: 12 .  ibuprofen (ADVIL,MOTRIN) 200 MG tablet, Take 400 mg by mouth every 6 (six) hours as needed for headache or mild pain., Disp: , Rfl:  .  ondansetron (ZOFRAN ODT) 8 MG disintegrating tablet, Take 1 pill 30 minutes before chemotherapy, Disp: 30 tablet, Rfl: 4 .  prochlorperazine (COMPAZINE) 10 MG tablet, Take 1 tablet (10 mg total) by mouth every  6 (six) hours as needed for nausea or vomiting., Disp: 60 tablet, Rfl: 5 .  Pseudoephedrine HCl (PSEUDO PO), Take 2 tablets by mouth daily as needed (congestion)., Disp: , Rfl:   Allergies:  Allergies  Allergen Reactions  . Bactrim [Sulfamethoxazole-Trimethoprim]   . Oxaprozin Hives    REACTION: Hives  . Penicillins Hives    REACTION: Hives  . Sulfonamide Derivatives Swelling    Massive extremity swelling     Past Medical History, Surgical history, Social history, and Family History were reviewed and updated.  Review of Systems: As above  Physical Exam:  height is 5\' 5"  (1.651 m) and weight is 139 lb (63.05 kg). Her oral temperature is 97.7 F (36.5 C). Her blood pressure is 119/63 and her pulse is 72. Her respiration is 16.   Wt Readings from Last 3 Encounters:  05/12/15 139 lb (63.05 kg)  04/20/15 135 lb (61.236 kg)  04/11/15 137 lb (62.143 kg)     Head and neck exam shows no ocular or oral lesions. She has no palpable cervical or supraclavicular lymph nodes. Lungs are clear to percussion and auscultation bilaterally. Cardiac exam regular rate and rhythm with no murmurs, rubs or bruits. Abdomen is soft. She has good bowel sounds. She has the well-healed laparoscopy scars. She has colostomy in the left lower quadrant. There is no palpable liver or spleen  tip. Back exam shows no tenderness over the spine, ribs or hips. Extremity shows no clubbing, cyanosis or edema. Neurological exam shows no focal neurological deficit. Skin exam shows no rashes, ecchymoses or petechia.   Lab Results  Component Value Date   WBC 6.3 05/12/2015   HGB 13.0 05/12/2015   HCT 38.4 05/12/2015   MCV 89 05/12/2015   PLT 296 05/12/2015     Chemistry      Component Value Date/Time   NA 142 04/20/2015 1136   NA 139 02/22/2015 0425   K 4.3 04/20/2015 1136   K 4.0 02/22/2015 0425   CL 106 02/22/2015 0425   CO2 26 04/20/2015 1136   CO2 29 02/22/2015 0425   BUN 13.1 04/20/2015 1136   BUN <5*  02/22/2015 0425   CREATININE 0.8 04/20/2015 1136   CREATININE 0.45 02/22/2015 0425      Component Value Date/Time   CALCIUM 9.5 04/20/2015 1136   CALCIUM 8.3* 02/22/2015 0425   ALKPHOS 65 04/20/2015 1136   ALKPHOS 76 02/10/2015 1703   AST 13 04/20/2015 1136   AST 15 02/10/2015 1703   ALT 13 04/20/2015 1136   ALT 14 02/10/2015 1703   BILITOT 0.70 04/20/2015 1136   BILITOT 0.3 02/10/2015 1703         Impression and Plan: [name] is a 45 year old white female. She has a stage IIB adenocarcinoma of the sigmoid colon.again, her Oncotype score is a lot higher than I thought it would be.  We will proceed with her third cycle of Xeloda now. She's done quite well.  Her hands do look a little bit on the red side. I told her make sure that she uses udder cream to try to help prevent problems.  We will plan to get her back in another 3 weeks.   I told her to make sure that she wears sunscreen when she is outside.      Volanda Napoleon, MD 2/17/20179:58 AM

## 2015-06-01 ENCOUNTER — Encounter: Payer: Self-pay | Admitting: Family

## 2015-06-01 ENCOUNTER — Other Ambulatory Visit (HOSPITAL_BASED_OUTPATIENT_CLINIC_OR_DEPARTMENT_OTHER): Payer: BLUE CROSS/BLUE SHIELD

## 2015-06-01 ENCOUNTER — Ambulatory Visit (HOSPITAL_BASED_OUTPATIENT_CLINIC_OR_DEPARTMENT_OTHER): Payer: BLUE CROSS/BLUE SHIELD | Admitting: Family

## 2015-06-01 VITALS — BP 118/80 | HR 71 | Temp 97.5°F | Resp 18 | Ht 65.0 in | Wt 141.0 lb

## 2015-06-01 DIAGNOSIS — C186 Malignant neoplasm of descending colon: Secondary | ICD-10-CM

## 2015-06-01 DIAGNOSIS — D509 Iron deficiency anemia, unspecified: Secondary | ICD-10-CM

## 2015-06-01 DIAGNOSIS — Z933 Colostomy status: Secondary | ICD-10-CM | POA: Diagnosis not present

## 2015-06-01 DIAGNOSIS — C187 Malignant neoplasm of sigmoid colon: Secondary | ICD-10-CM

## 2015-06-01 LAB — COMPREHENSIVE METABOLIC PANEL
ALT: 27 U/L (ref 0–55)
ANION GAP: 10 meq/L (ref 3–11)
AST: 21 U/L (ref 5–34)
Albumin: 4.5 g/dL (ref 3.5–5.0)
Alkaline Phosphatase: 73 U/L (ref 40–150)
BUN: 19.9 mg/dL (ref 7.0–26.0)
CHLORIDE: 105 meq/L (ref 98–109)
CO2: 27 meq/L (ref 22–29)
Calcium: 9.7 mg/dL (ref 8.4–10.4)
Creatinine: 0.8 mg/dL (ref 0.6–1.1)
GLUCOSE: 94 mg/dL (ref 70–140)
Potassium: 4.3 mEq/L (ref 3.5–5.1)
SODIUM: 141 meq/L (ref 136–145)
Total Bilirubin: 0.84 mg/dL (ref 0.20–1.20)
Total Protein: 7.5 g/dL (ref 6.4–8.3)

## 2015-06-01 LAB — IRON AND TIBC
%SAT: 20 % — AB (ref 21–57)
IRON: 77 ug/dL (ref 41–142)
TIBC: 374 ug/dL (ref 236–444)
UIBC: 297 ug/dL (ref 120–384)

## 2015-06-01 LAB — CBC WITH DIFFERENTIAL (CANCER CENTER ONLY)
BASO#: 0 10*3/uL (ref 0.0–0.2)
BASO%: 0.3 % (ref 0.0–2.0)
EOS ABS: 0.1 10*3/uL (ref 0.0–0.5)
EOS%: 1.8 % (ref 0.0–7.0)
HEMATOCRIT: 39.3 % (ref 34.8–46.6)
HEMOGLOBIN: 13.8 g/dL (ref 11.6–15.9)
LYMPH#: 1.6 10*3/uL (ref 0.9–3.3)
LYMPH%: 25.9 % (ref 14.0–48.0)
MCH: 31.7 pg (ref 26.0–34.0)
MCHC: 35.1 g/dL (ref 32.0–36.0)
MCV: 90 fL (ref 81–101)
MONO#: 0.5 10*3/uL (ref 0.1–0.9)
MONO%: 7.2 % (ref 0.0–13.0)
NEUT#: 4.1 10*3/uL (ref 1.5–6.5)
NEUT%: 64.8 % (ref 39.6–80.0)
Platelets: 242 10*3/uL (ref 145–400)
RBC: 4.35 10*6/uL (ref 3.70–5.32)
RDW: 17.3 % — ABNORMAL HIGH (ref 11.1–15.7)
WBC: 6.3 10*3/uL (ref 3.9–10.0)

## 2015-06-01 LAB — FERRITIN: FERRITIN: 56 ng/mL (ref 9–269)

## 2015-06-01 NOTE — Progress Notes (Signed)
Hematology and Oncology Follow Up Visit  Carla Mills [id number] [birth year] 45 y.o. 06/01/2015   Principle Diagnosis:  High risk stage IIB (T4N0M0) adenocarcinoma of the sigmoid colon - Oncotype = 37  Current Therapy:   S/p cycle 3 of adjuvant Xeloda    Interim History:  Carla Mills is here today for a follow-up. She is doing quite well on Xeloda. She is on her 7 day break now and will begin cycle 4 on Wednesday. Her hands are red but she has not developed any blisters or peeling. She has increased sensation as well. She denies pain.  She has stopped rinsing her mouth out but has had no problem with oral sores.  She has had no problems with her ostomy. She will have occasional diarrhea that resolves quickly. No constipation or blood in her stool. No abdominal pain.  No fever, chills, n/v, cough, rash, dizziness, SOB, chest pain, palpitations or changes in bladder habits.  No swelling, tenderness, numbness or tingling in her extremities.  She has maintained a good appetite and is staying well hydrated. Her weight is stable.  She is working part time and is staying active with her 3 daughters.   Medications:    Medication List       This list is accurate as of: 06/01/15 10:47 AM.  Always use your most recent med list.               capecitabine 500 MG tablet  Commonly known as:  XELODA  Take 4 pills twice a day with food for 14 days, then off for 7 days.     fexofenadine 180 MG tablet  Commonly known as:  ALLEGRA  Take 180 mg by mouth daily as needed for allergies.     fluticasone 50 MCG/ACT nasal spray  Commonly known as:  FLONASE  Place 2 sprays into the nose daily.     ibuprofen 200 MG tablet  Commonly known as:  ADVIL,MOTRIN  Take 400 mg by mouth every 6 (six) hours as needed for headache or mild pain.     ondansetron 8 MG disintegrating tablet  Commonly known as:  ZOFRAN ODT  Take 1 pill 30 minutes before chemotherapy     prochlorperazine 10 MG tablet  Commonly  known as:  COMPAZINE  Take 1 tablet (10 mg total) by mouth every 6 (six) hours as needed for nausea or vomiting.     PSEUDO PO  Take 2 tablets by mouth daily as needed (congestion).        Allergies:  Allergies  Allergen Reactions  . Bactrim [Sulfamethoxazole-Trimethoprim]   . Oxaprozin Hives    REACTION: Hives  . Penicillins Hives    REACTION: Hives  . Sulfonamide Derivatives Swelling    Massive extremity swelling     Past Medical History, Surgical history, Social history, and Family History were reviewed and updated.  Review of Systems: All other 10 point review of systems is negative.   Physical Exam:  height is 5\' 5"  (1.651 m) and weight is 141 lb (63.957 kg). Her oral temperature is 97.5 F (36.4 C). Her blood pressure is 118/80 and her pulse is 71. Her respiration is 18.   Wt Readings from Last 3 Encounters:  06/01/15 141 lb (63.957 kg)  05/12/15 139 lb (63.05 kg)  04/20/15 135 lb (61.236 kg)    Ocular: Sclerae unicteric, pupils equal, round and reactive to light Ear-nose-throat: Oropharynx clear, dentition fair Lymphatic: No cervical supraclavicular or axillary adenopathy Lungs no rales or  rhonchi, good excursion bilaterally Heart regular rate and rhythm, no murmur appreciated Abd soft, nontender, positive bowel sounds, no liver or spleen tip palpated on exam, no fluid wave   MSK no focal spinal tenderness, no joint edema Neuro: non-focal, well-oriented, appropriate affect Breasts: Deferred  Lab Results  Component Value Date   WBC 6.3 06/01/2015   HGB 13.8 06/01/2015   HCT 39.3 06/01/2015   MCV 90 06/01/2015   PLT 242 06/01/2015   Lab Results  Component Value Date   FERRITIN 53 04/20/2015   IRON 68 04/20/2015   TIBC 331 04/20/2015   UIBC 264 04/20/2015   IRONPCTSAT 20* 04/20/2015   Lab Results  Component Value Date   RETICCTPCT 0.8 02/12/2015   RBC 4.35 06/01/2015   No results found for: KPAFRELGTCHN, LAMBDASER, KAPLAMBRATIO No results found  for: IGGSERUM, IGA, IGMSERUM No results found for: Odetta Pink, SPEI   Chemistry      Component Value Date/Time   NA 139 05/12/2015 0908   NA 142 04/20/2015 1136   NA 139 02/22/2015 0425   K 4.5 05/12/2015 0908   K 4.3 04/20/2015 1136   K 4.0 02/22/2015 0425   CL 104 05/12/2015 0908   CL 106 02/22/2015 0425   CO2 30 05/12/2015 0908   CO2 26 04/20/2015 1136   CO2 29 02/22/2015 0425   BUN 14 05/12/2015 0908   BUN 13.1 04/20/2015 1136   BUN <5* 02/22/2015 0425   CREATININE 0.8 05/12/2015 0908   CREATININE 0.8 04/20/2015 1136   CREATININE 0.45 02/22/2015 0425      Component Value Date/Time   CALCIUM 9.3 05/12/2015 0908   CALCIUM 9.5 04/20/2015 1136   CALCIUM 8.3* 02/22/2015 0425   ALKPHOS 48 05/12/2015 0908   ALKPHOS 65 04/20/2015 1136   ALKPHOS 76 02/10/2015 1703   AST 30 05/12/2015 0908   AST 13 04/20/2015 1136   AST 15 02/10/2015 1703   ALT 36 05/12/2015 0908   ALT 13 04/20/2015 1136   ALT 14 02/10/2015 1703   BILITOT 0.80 05/12/2015 0908   BILITOT 0.70 04/20/2015 1136   BILITOT 0.3 02/10/2015 1703     Impression and Plan: Carla Mills is a pleasant 45 yo white female with stage IIB adenocarcinoma of the sigmoid colon and Oncotype score of 37. She had an LAR with Hartmann's pouch, colostomy and appy on 02/23/16. She is now doing well on Xeloda and has no complaints at this time. Her ostomy continues to function appropriately.  She is currently on her 7 day break with xeloda and will start cycle 4 on Wednesday.  She uses the udderly smooth hand cream several times daily and wears gloves. This has really helped with the dry skin. She has no peeling or blisters. The sensitivity is unchanged.  We will plan to see her back in 3 weeks for labs and follow-up.  She will contact us with any questions or concerns. We can certainly see her sooner if need be.   Eliezer Bottom, NP 3/9/201710:47 AM

## 2015-06-22 ENCOUNTER — Ambulatory Visit (HOSPITAL_BASED_OUTPATIENT_CLINIC_OR_DEPARTMENT_OTHER): Payer: BLUE CROSS/BLUE SHIELD | Admitting: Hematology & Oncology

## 2015-06-22 ENCOUNTER — Other Ambulatory Visit (HOSPITAL_BASED_OUTPATIENT_CLINIC_OR_DEPARTMENT_OTHER): Payer: BLUE CROSS/BLUE SHIELD

## 2015-06-22 ENCOUNTER — Encounter: Payer: Self-pay | Admitting: Hematology & Oncology

## 2015-06-22 VITALS — BP 108/73 | HR 62 | Temp 97.6°F | Resp 16 | Ht 65.0 in | Wt 142.0 lb

## 2015-06-22 DIAGNOSIS — C187 Malignant neoplasm of sigmoid colon: Secondary | ICD-10-CM

## 2015-06-22 DIAGNOSIS — C186 Malignant neoplasm of descending colon: Secondary | ICD-10-CM

## 2015-06-22 LAB — CBC WITH DIFFERENTIAL (CANCER CENTER ONLY)
BASO#: 0 10*3/uL (ref 0.0–0.2)
BASO%: 0.4 % (ref 0.0–2.0)
EOS%: 2.3 % (ref 0.0–7.0)
Eosinophils Absolute: 0.2 10*3/uL (ref 0.0–0.5)
HCT: 37.7 % (ref 34.8–46.6)
HGB: 13.5 g/dL (ref 11.6–15.9)
LYMPH#: 1.9 10*3/uL (ref 0.9–3.3)
LYMPH%: 23.5 % (ref 14.0–48.0)
MCH: 32.7 pg (ref 26.0–34.0)
MCHC: 35.8 g/dL (ref 32.0–36.0)
MCV: 91 fL (ref 81–101)
MONO#: 0.4 10*3/uL (ref 0.1–0.9)
MONO%: 5.1 % (ref 0.0–13.0)
NEUT#: 5.6 10*3/uL (ref 1.5–6.5)
NEUT%: 68.7 % (ref 39.6–80.0)
PLATELETS: 271 10*3/uL (ref 145–400)
RBC: 4.13 10*6/uL (ref 3.70–5.32)
RDW: 17.4 % — AB (ref 11.1–15.7)
WBC: 8.1 10*3/uL (ref 3.9–10.0)

## 2015-06-22 LAB — COMPREHENSIVE METABOLIC PANEL
ALT: 21 U/L (ref 0–55)
ANION GAP: 8 meq/L (ref 3–11)
AST: 23 U/L (ref 5–34)
Albumin: 4.4 g/dL (ref 3.5–5.0)
Alkaline Phosphatase: 62 U/L (ref 40–150)
BILIRUBIN TOTAL: 1.36 mg/dL — AB (ref 0.20–1.20)
BUN: 14.1 mg/dL (ref 7.0–26.0)
CO2: 26 meq/L (ref 22–29)
CREATININE: 0.8 mg/dL (ref 0.6–1.1)
Calcium: 9.4 mg/dL (ref 8.4–10.4)
Chloride: 106 mEq/L (ref 98–109)
EGFR: >90 mL/min/{1.73_m2} (ref >90)
GLUCOSE: 86 mg/dL (ref 70–140)
Potassium: 3.8 mEq/L (ref 3.5–5.1)
SODIUM: 141 meq/L (ref 136–145)
Total Protein: 7.6 g/dL (ref 6.4–8.3)

## 2015-06-22 LAB — IRON AND TIBC
%SAT: 26 % (ref 21–57)
Iron: 98 ug/dL (ref 41–142)
TIBC: 374 ug/dL (ref 236–444)
UIBC: 275 ug/dL (ref 120–384)

## 2015-06-22 LAB — FERRITIN: FERRITIN: 88 ng/mL (ref 9–269)

## 2015-06-22 NOTE — Progress Notes (Signed)
Hematology and Oncology Follow Up Visit  Carla Mills [id number] 1970/04/10 45 y.o. 06/22/2015   Principle Diagnosis:   High risk stage IIB (T4N0M0) adenocarcinoma of the sigmoid colon -- Oncotype = 37  Current Therapy:    S/p cycle #4 of adjuvant Xeloda     Interim History:  Carla Mills is back for follow-up. She looks really good.   So far, she really has had very little problems with the Xeloda. Her hands have been a little bit erythematous. She has had some slight peeling. She's been very diligent with her skin program with skin lotion and cream.  Her colostomy is doing okay. She's not having a lot of diarrhea.  She has had no fever.  There's been no cough or shortness of breath.  She's had no bleeding.  Does not been any leg swelling.  Her appetite has been good. Her weight is holding pretty stable.  Overall, her performance status is ECOG 0.  Medications:  Current outpatient prescriptions:  .  capecitabine (XELODA) 500 MG tablet, Take 4 pills twice a day with food for 14 days, then off for 7 days., Disp: 112 tablet, Rfl: 8 .  fexofenadine (ALLEGRA) 180 MG tablet, Take 180 mg by mouth daily as needed for allergies. , Disp: , Rfl:  .  fluticasone (FLONASE) 50 MCG/ACT nasal spray, Place 2 sprays into the nose daily., Disp: 16 g, Rfl: 12 .  ibuprofen (ADVIL,MOTRIN) 200 MG tablet, Take 400 mg by mouth every 6 (six) hours as needed for headache or mild pain., Disp: , Rfl:  .  ondansetron (ZOFRAN ODT) 8 MG disintegrating tablet, Take 1 pill 30 minutes before chemotherapy, Disp: 30 tablet, Rfl: 4 .  prochlorperazine (COMPAZINE) 10 MG tablet, Take 1 tablet (10 mg total) by mouth every 6 (six) hours as needed for nausea or vomiting., Disp: 60 tablet, Rfl: 5 .  Pseudoephedrine HCl (PSEUDO PO), Take 2 tablets by mouth daily as needed (congestion)., Disp: , Rfl:   Allergies:  Allergies  Allergen Reactions  . Bactrim [Sulfamethoxazole-Trimethoprim]   . Oxaprozin Hives   REACTION: Hives  . Penicillins Hives    REACTION: Hives  . Sulfonamide Derivatives Swelling    Massive extremity swelling     Past Medical History, Surgical history, Social history, and Family History were reviewed and updated.  Review of Systems: As above  Physical Exam:  height is 5\' 5"  (1.651 m) and weight is 142 lb (64.411 kg). Her oral temperature is 97.6 F (36.4 C). Her blood pressure is 108/73 and her pulse is 62. Her respiration is 16.   Wt Readings from Last 3 Encounters:  06/22/15 142 lb (64.411 kg)  06/01/15 141 lb (63.957 kg)  05/12/15 139 lb (63.05 kg)     Head and neck exam shows no ocular or oral lesions. She has no palpable cervical or supraclavicular lymph nodes. Lungs are clear to percussion and auscultation bilaterally. Cardiac exam regular rate and rhythm with no murmurs, rubs or bruits. Abdomen is soft. She has good bowel sounds. She has the well-healed laparoscopy scars. She has colostomy in the left lower quadrant. There is no palpable liver or spleen tip. Back exam shows no tenderness over the spine, ribs or hips. Extremity shows no clubbing, cyanosis or edema. Neurological exam shows no focal neurological deficit. Skin exam shows no rashes, ecchymoses or petechia.   Lab Results  Component Value Date   WBC 8.1 06/22/2015   HGB 13.5 06/22/2015   HCT 37.7 06/22/2015   MCV 91  06/22/2015   PLT 271 06/22/2015     Chemistry      Component Value Date/Time   NA 141 06/01/2015 0932   NA 139 05/12/2015 0908   NA 139 02/22/2015 0425   K 4.3 06/01/2015 0932   K 4.5 05/12/2015 0908   K 4.0 02/22/2015 0425   CL 104 05/12/2015 0908   CL 106 02/22/2015 0425   CO2 27 06/01/2015 0932   CO2 30 05/12/2015 0908   CO2 29 02/22/2015 0425   BUN 19.9 06/01/2015 0932   BUN 14 05/12/2015 0908   BUN <5* 02/22/2015 0425   CREATININE 0.8 06/01/2015 0932   CREATININE 0.8 05/12/2015 0908   CREATININE 0.45 02/22/2015 0425      Component Value Date/Time   CALCIUM 9.7  06/01/2015 0932   CALCIUM 9.3 05/12/2015 0908   CALCIUM 8.3* 02/22/2015 0425   ALKPHOS 73 06/01/2015 0932   ALKPHOS 48 05/12/2015 0908   ALKPHOS 76 02/10/2015 1703   AST 21 06/01/2015 0932   AST 30 05/12/2015 0908   AST 15 02/10/2015 1703   ALT 27 06/01/2015 0932   ALT 36 05/12/2015 0908   ALT 14 02/10/2015 1703   BILITOT 0.84 06/01/2015 0932   BILITOT 0.80 05/12/2015 0908   BILITOT 0.3 02/10/2015 1703         Impression and Plan: Carla Mills is a 45 year old white female. She has a stage IIB adenocarcinoma of the sigmoid colon.again, her Oncotype score is a lot higher than I thought it would be.  We will proceed with her 5thcycle of Xeloda now. She's done quite well.  Her hands do look Better. She's been very aggressive with taking care of her skin.   We will plan to get her back in another 3 weeks.   I told her to make sure that she wears sunscreen when she is outside.      Volanda Napoleon, MD 3/30/201711:34 AM

## 2015-06-23 LAB — CEA: CEA1: 2.6 ng/mL (ref 0.0–4.7)

## 2015-06-23 LAB — CEA (PARALLEL TESTING): CEA: 1.7 ng/mL

## 2015-07-13 ENCOUNTER — Other Ambulatory Visit (HOSPITAL_BASED_OUTPATIENT_CLINIC_OR_DEPARTMENT_OTHER): Payer: BLUE CROSS/BLUE SHIELD

## 2015-07-13 ENCOUNTER — Ambulatory Visit (HOSPITAL_BASED_OUTPATIENT_CLINIC_OR_DEPARTMENT_OTHER): Payer: BLUE CROSS/BLUE SHIELD | Admitting: Hematology & Oncology

## 2015-07-13 ENCOUNTER — Encounter: Payer: Self-pay | Admitting: Hematology & Oncology

## 2015-07-13 VITALS — BP 118/82 | HR 64 | Temp 97.8°F | Resp 16 | Wt 145.0 lb

## 2015-07-13 DIAGNOSIS — C184 Malignant neoplasm of transverse colon: Secondary | ICD-10-CM

## 2015-07-13 DIAGNOSIS — C187 Malignant neoplasm of sigmoid colon: Secondary | ICD-10-CM | POA: Diagnosis not present

## 2015-07-13 LAB — COMPREHENSIVE METABOLIC PANEL
ALT: 36 U/L (ref 0–55)
ANION GAP: 8 meq/L (ref 3–11)
AST: 31 U/L (ref 5–34)
Albumin: 4.2 g/dL (ref 3.5–5.0)
Alkaline Phosphatase: 63 U/L (ref 40–150)
BILIRUBIN TOTAL: 1.36 mg/dL — AB (ref 0.20–1.20)
BUN: 16.6 mg/dL (ref 7.0–26.0)
CO2: 24 meq/L (ref 22–29)
CREATININE: 0.8 mg/dL (ref 0.6–1.1)
Calcium: 9.3 mg/dL (ref 8.4–10.4)
Chloride: 108 mEq/L (ref 98–109)
EGFR: 87 mL/min/{1.73_m2} — ABNORMAL LOW (ref >90)
Glucose: 93 mg/dl (ref 70–140)
Potassium: 4.4 mEq/L (ref 3.5–5.1)
Sodium: 141 mEq/L (ref 136–145)
TOTAL PROTEIN: 7 g/dL (ref 6.4–8.3)

## 2015-07-13 LAB — CBC WITH DIFFERENTIAL (CANCER CENTER ONLY)
BASO#: 0 10*3/uL (ref 0.0–0.2)
BASO%: 0.4 % (ref 0.0–2.0)
EOS%: 3.3 % (ref 0.0–7.0)
Eosinophils Absolute: 0.2 10*3/uL (ref 0.0–0.5)
HEMATOCRIT: 36.9 % (ref 34.8–46.6)
HEMOGLOBIN: 13.1 g/dL (ref 11.6–15.9)
LYMPH#: 1.9 10*3/uL (ref 0.9–3.3)
LYMPH%: 33.8 % (ref 14.0–48.0)
MCH: 33.9 pg (ref 26.0–34.0)
MCHC: 35.5 g/dL (ref 32.0–36.0)
MCV: 96 fL (ref 81–101)
MONO#: 0.4 10*3/uL (ref 0.1–0.9)
MONO%: 7.4 % (ref 0.0–13.0)
NEUT%: 55.1 % (ref 39.6–80.0)
NEUTROS ABS: 3.1 10*3/uL (ref 1.5–6.5)
PLATELETS: 265 10*3/uL (ref 145–400)
RBC: 3.86 10*6/uL (ref 3.70–5.32)
RDW: 17.1 % — ABNORMAL HIGH (ref 11.1–15.7)
WBC: 5.5 10*3/uL (ref 3.9–10.0)

## 2015-07-13 NOTE — Progress Notes (Signed)
Hematology and Oncology Follow Up Visit  Dot Dahler [id number] 11-17-1970 45 y.o. 07/13/2015   Principle Diagnosis:   High risk stage IIB (T4N0M0) adenocarcinoma of the sigmoid colon -- Oncotype = 37  Current Therapy:    S/p cycle #5 of adjuvant Xeloda     Interim History:  Ms. Sweezer is back for follow-up. She looks really good. She really had a good time on spring break with her 2 daughters. They went down to [facility]. She had a good time down there. She had no problems with her colostomy. She walked quite a bit. Her appetite has been good. She's had no mouth sores.  Her hands are a little bit rough. There is somewhat erythematous. They have very slight peeling. She is doing good job applying lotion and try to make sure that her hands do not crack.  Her colostomy is working "over time".  She's had no pain.  She's had no fever.  We did check a CEA level on her. It was nice and low at 2.6.  It is still very sad that her insurance company has denied the Oncotype assay. It is purely a money issue for [employer]. However, I know that this will be paid for.  Overall, her performance status is ECOG 0.  Medications:  Current outpatient prescriptions:  .  capecitabine (XELODA) 500 MG tablet, Take 4 pills twice a day with food for 14 days, then off for 7 days., Disp: 112 tablet, Rfl: 8 .  fexofenadine (ALLEGRA) 180 MG tablet, Take 180 mg by mouth daily as needed for allergies. , Disp: , Rfl:  .  fluticasone (FLONASE) 50 MCG/ACT nasal spray, Place 2 sprays into the nose daily., Disp: 16 g, Rfl: 12 .  ibuprofen (ADVIL,MOTRIN) 200 MG tablet, Take 400 mg by mouth every 6 (six) hours as needed for headache or mild pain., Disp: , Rfl:  .  ondansetron (ZOFRAN ODT) 8 MG disintegrating tablet, Take 1 pill 30 minutes before chemotherapy, Disp: 30 tablet, Rfl: 4 .  prochlorperazine (COMPAZINE) 10 MG tablet, Take 1 tablet (10 mg total) by mouth every 6 (six) hours as needed for  nausea or vomiting., Disp: 60 tablet, Rfl: 5 .  Pseudoephedrine HCl (PSEUDO PO), Take 2 tablets by mouth daily as needed (congestion)., Disp: , Rfl:   Allergies:  Allergies  Allergen Reactions  . Bactrim [Sulfamethoxazole-Trimethoprim]   . Oxaprozin Hives    REACTION: Hives  . Penicillins Hives    REACTION: Hives  . Sulfonamide Derivatives Swelling    Massive extremity swelling     Past Medical History, Surgical history, Social history, and Family History were reviewed and updated.  Review of Systems: As above  Physical Exam:  weight is 145 lb (65.772 kg). Her oral temperature is 97.8 F (36.6 C). Her blood pressure is 118/82 and her pulse is 64. Her respiration is 16.   Wt Readings from Last 3 Encounters:  07/13/15 145 lb (65.772 kg)  06/22/15 142 lb (64.411 kg)  06/01/15 141 lb (63.957 kg)     Head and neck exam shows no ocular or oral lesions. She has no palpable cervical or supraclavicular lymph nodes. Lungs are clear to percussion and auscultation bilaterally. Cardiac exam regular rate and rhythm with no murmurs, rubs or bruits. Abdomen is soft. She has good bowel sounds. She has the well-healed laparoscopy scars. She has colostomy in the left lower quadrant. There is no palpable liver or spleen tip. Back exam shows no tenderness over the spine, ribs  or hips. Extremity shows no clubbing, cyanosis or edema. Neurological exam shows no focal neurological deficit. Skin exam shows no rashes, ecchymoses or petechia.   Lab Results  Component Value Date   WBC 5.5 07/13/2015   HGB 13.1 07/13/2015   HCT 36.9 07/13/2015   MCV 96 07/13/2015   PLT 265 07/13/2015     Chemistry      Component Value Date/Time   NA 141 06/22/2015 1047   NA 139 05/12/2015 0908   NA 139 02/22/2015 0425   K 3.8 06/22/2015 1047   K 4.5 05/12/2015 0908   K 4.0 02/22/2015 0425   CL 104 05/12/2015 0908   CL 106 02/22/2015 0425   CO2 26 06/22/2015 1047   CO2 30 05/12/2015 0908   CO2 29 02/22/2015  0425   BUN 14.1 06/22/2015 1047   BUN 14 05/12/2015 0908   BUN <5* 02/22/2015 0425   CREATININE 0.8 06/22/2015 1047   CREATININE 0.8 05/12/2015 0908   CREATININE 0.45 02/22/2015 0425      Component Value Date/Time   CALCIUM 9.4 06/22/2015 1047   CALCIUM 9.3 05/12/2015 0908   CALCIUM 8.3* 02/22/2015 0425   ALKPHOS 62 06/22/2015 1047   ALKPHOS 48 05/12/2015 0908   ALKPHOS 76 02/10/2015 1703   AST 23 06/22/2015 1047   AST 30 05/12/2015 0908   AST 15 02/10/2015 1703   ALT 21 06/22/2015 1047   ALT 36 05/12/2015 0908   ALT 14 02/10/2015 1703   BILITOT 1.36* 06/22/2015 1047   BILITOT 0.80 05/12/2015 0908   BILITOT 0.3 02/10/2015 1703         Impression and Plan: Ms. Burkart is a 45 year old white female. She has a stage IIB adenocarcinoma of the sigmoid colon.again, her Oncotype score is a lot higher than I thought it would be.  We will proceed with her 6thcycle of Xeloda now. She's done quite well.  Her hands do look A little better .    She is being very aggressive with taking care of her skin.   We will plan to get her back in another 3 weeks.   I told her to make sure that she wears sunscreen when she is outside.      Volanda Napoleon, MD 4/20/20178:46 AM

## 2015-08-03 ENCOUNTER — Ambulatory Visit (HOSPITAL_BASED_OUTPATIENT_CLINIC_OR_DEPARTMENT_OTHER): Payer: BLUE CROSS/BLUE SHIELD | Admitting: Hematology & Oncology

## 2015-08-03 ENCOUNTER — Encounter: Payer: Self-pay | Admitting: Hematology & Oncology

## 2015-08-03 ENCOUNTER — Other Ambulatory Visit (HOSPITAL_BASED_OUTPATIENT_CLINIC_OR_DEPARTMENT_OTHER): Payer: BLUE CROSS/BLUE SHIELD

## 2015-08-03 VITALS — BP 115/68 | HR 78 | Temp 98.0°F | Resp 18 | Ht 65.0 in | Wt 148.0 lb

## 2015-08-03 DIAGNOSIS — C187 Malignant neoplasm of sigmoid colon: Secondary | ICD-10-CM

## 2015-08-03 DIAGNOSIS — C184 Malignant neoplasm of transverse colon: Secondary | ICD-10-CM

## 2015-08-03 LAB — CBC WITH DIFFERENTIAL (CANCER CENTER ONLY)
BASO#: 0 10*3/uL (ref 0.0–0.2)
BASO%: 0.4 % (ref 0.0–2.0)
EOS ABS: 0.2 10*3/uL (ref 0.0–0.5)
EOS%: 2 % (ref 0.0–7.0)
HCT: 36.1 % (ref 34.8–46.6)
HGB: 12.9 g/dL (ref 11.6–15.9)
LYMPH#: 2.4 10*3/uL (ref 0.9–3.3)
LYMPH%: 29.6 % (ref 14.0–48.0)
MCH: 35.6 pg — AB (ref 26.0–34.0)
MCHC: 35.7 g/dL (ref 32.0–36.0)
MCV: 100 fL (ref 81–101)
MONO#: 0.6 10*3/uL (ref 0.1–0.9)
MONO%: 7.5 % (ref 0.0–13.0)
NEUT#: 4.9 10*3/uL (ref 1.5–6.5)
NEUT%: 60.5 % (ref 39.6–80.0)
PLATELETS: 267 10*3/uL (ref 145–400)
RBC: 3.62 10*6/uL — AB (ref 3.70–5.32)
RDW: 16.5 % — ABNORMAL HIGH (ref 11.1–15.7)
WBC: 8 10*3/uL (ref 3.9–10.0)

## 2015-08-03 LAB — CMP (CANCER CENTER ONLY)
ALT(SGPT): 36 U/L (ref 10–47)
AST: 31 U/L (ref 11–38)
Albumin: 4.2 g/dL (ref 3.3–5.5)
Alkaline Phosphatase: 55 U/L (ref 26–84)
BILIRUBIN TOTAL: 1.5 mg/dL (ref 0.20–1.60)
BUN: 15 mg/dL (ref 7–22)
CHLORIDE: 105 meq/L (ref 98–108)
CO2: 27 meq/L (ref 18–33)
CREATININE: 1 mg/dL (ref 0.6–1.2)
Calcium: 9.5 mg/dL (ref 8.0–10.3)
Glucose, Bld: 91 mg/dL (ref 73–118)
Potassium: 4.4 mEq/L (ref 3.3–4.7)
SODIUM: 143 meq/L (ref 128–145)
TOTAL PROTEIN: 7.1 g/dL (ref 6.4–8.1)

## 2015-08-03 NOTE — Progress Notes (Signed)
Hematology and Oncology Follow Up Visit  Kambrea Scheerer [id number] 10/20/1970 45 y.o. 08/03/2015   Principle Diagnosis:   High risk stage IIB (T4N0M0) adenocarcinoma of the sigmoid colon -- Oncotype = 37  Current Therapy:    S/p cycle #5 of adjuvant Xeloda     Interim History:  Ms. Liscio is back for follow-up. She looks really good. She really hasn't well with the Xeloda. She really has had very few and with side effects. Her hands and feet have taken the most abuse from the Xeloda. She has some erythematous and cracking of the skin on her hands. There is as much as on her feet. There is no neuropathic issues.  Her colostomy is working quite well. The Xeloda is not causing any additional problems.  She changes the colostomy every 4 days.  Her appetite has been good. She's had no nausea or vomiting. She's had no mouth sores.  There's been no bleeding.  Overall, her performance status is ECOG 1.  Medications:  Current outpatient prescriptions:  .  capecitabine (XELODA) 500 MG tablet, Take 4 pills twice a day with food for 14 days, then off for 7 days., Disp: 112 tablet, Rfl: 8 .  fexofenadine (ALLEGRA) 180 MG tablet, Take 180 mg by mouth daily as needed for allergies. , Disp: , Rfl:  .  fluticasone (FLONASE) 50 MCG/ACT nasal spray, Place 2 sprays into the nose daily., Disp: 16 g, Rfl: 12 .  ibuprofen (ADVIL,MOTRIN) 200 MG tablet, Take 400 mg by mouth every 6 (six) hours as needed for headache or mild pain., Disp: , Rfl:  .  ondansetron (ZOFRAN ODT) 8 MG disintegrating tablet, Take 1 pill 30 minutes before chemotherapy, Disp: 30 tablet, Rfl: 4 .  prochlorperazine (COMPAZINE) 10 MG tablet, Take 1 tablet (10 mg total) by mouth every 6 (six) hours as needed for nausea or vomiting., Disp: 60 tablet, Rfl: 5 .  Pseudoephedrine HCl (PSEUDO PO), Take 2 tablets by mouth daily as needed (congestion)., Disp: , Rfl:   Allergies:  Allergies  Allergen Reactions  . Bactrim  [Sulfamethoxazole-Trimethoprim]   . Oxaprozin Hives    REACTION: Hives  . Penicillins Hives    REACTION: Hives  . Sulfonamide Derivatives Swelling    Massive extremity swelling     Past Medical History, Surgical history, Social history, and Family History were reviewed and updated.  Review of Systems: As above  Physical Exam:  height is 5\' 5"  (1.651 m) and weight is 148 lb (67.132 kg). Her oral temperature is 98 F (36.7 C). Her blood pressure is 115/68 and her pulse is 78. Her respiration is 18.   Wt Readings from Last 3 Encounters:  08/03/15 148 lb (67.132 kg)  07/13/15 145 lb (65.772 kg)  06/22/15 142 lb (64.411 kg)     Head and neck exam shows no ocular or oral lesions. She has no palpable cervical or supraclavicular lymph nodes. Lungs are clear to percussion and auscultation bilaterally. Cardiac exam regular rate and rhythm with no murmurs, rubs or bruits. Abdomen is soft. She has good bowel sounds. She has the well-healed laparoscopy scars. She has colostomy in the left lower quadrant. There is no palpable liver or spleen tip. Back exam shows no tenderness over the spine, ribs or hips. Extremity shows no clubbing, cyanosis or edema. Neurological exam shows no focal neurological deficit. Skin exam shows no rashes, ecchymoses or petechia.   Lab Results  Component Value Date   WBC 8.0 08/03/2015   HGB 12.9 08/03/2015  HCT 36.1 08/03/2015   MCV 100 08/03/2015   PLT 267 08/03/2015     Chemistry      Component Value Date/Time   NA 143 08/03/2015 1200   NA 141 07/13/2015 0754   NA 139 02/22/2015 0425   K 4.4 08/03/2015 1200   K 4.4 07/13/2015 0754   K 4.0 02/22/2015 0425   CL 105 08/03/2015 1200   CL 106 02/22/2015 0425   CO2 27 08/03/2015 1200   CO2 24 07/13/2015 0754   CO2 29 02/22/2015 0425   BUN 15 08/03/2015 1200   BUN 16.6 07/13/2015 0754   BUN <5* 02/22/2015 0425   CREATININE 1.0 08/03/2015 1200   CREATININE 0.8 07/13/2015 0754   CREATININE 0.45 02/22/2015  0425      Component Value Date/Time   CALCIUM 9.5 08/03/2015 1200   CALCIUM 9.3 07/13/2015 0754   CALCIUM 8.3* 02/22/2015 0425   ALKPHOS 55 08/03/2015 1200   ALKPHOS 63 07/13/2015 0754   ALKPHOS 76 02/10/2015 1703   AST 31 08/03/2015 1200   AST 31 07/13/2015 0754   AST 15 02/10/2015 1703   ALT 36 08/03/2015 1200   ALT 36 07/13/2015 0754   ALT 14 02/10/2015 1703   BILITOT 1.50 08/03/2015 1200   BILITOT 1.36* 07/13/2015 0754   BILITOT 0.3 02/10/2015 1703         Impression and Plan: Ms. Mossholder is a 45 year old white female. She has a stage IIB adenocarcinoma of the sigmoid colon.again, her Oncotype score is a lot higher than I thought it would be.  We will proceed with her 7th cycle of Xeloda now. She's done quite well.  Her hands do look a little more erythematous and cracking. She is very thorough in applying lotion and cream to her hands and feet.  We will plan to get her back in another 3 weeks. This will be her last treatment was in her back. After that, I will see her back in 6 weeks and do scans this setting that we see her. I would then plan for scans every 4 months for the first year and then every 6 months the second year.  I told her to make sure that she wears sunscreen when she is outside.      Volanda Napoleon, MD 5/11/20171:03 PM

## 2015-08-25 ENCOUNTER — Ambulatory Visit (HOSPITAL_BASED_OUTPATIENT_CLINIC_OR_DEPARTMENT_OTHER): Payer: BLUE CROSS/BLUE SHIELD | Admitting: Hematology & Oncology

## 2015-08-25 ENCOUNTER — Other Ambulatory Visit (HOSPITAL_BASED_OUTPATIENT_CLINIC_OR_DEPARTMENT_OTHER): Payer: BLUE CROSS/BLUE SHIELD

## 2015-08-25 VITALS — BP 113/72 | HR 70 | Temp 97.9°F | Resp 16 | Wt 152.0 lb

## 2015-08-25 DIAGNOSIS — C187 Malignant neoplasm of sigmoid colon: Secondary | ICD-10-CM

## 2015-08-25 DIAGNOSIS — R238 Other skin changes: Secondary | ICD-10-CM

## 2015-08-25 DIAGNOSIS — C186 Malignant neoplasm of descending colon: Secondary | ICD-10-CM

## 2015-08-25 LAB — CBC WITH DIFFERENTIAL (CANCER CENTER ONLY)
BASO#: 0 10*3/uL (ref 0.0–0.2)
BASO%: 0.3 % (ref 0.0–2.0)
EOS ABS: 0.2 10*3/uL (ref 0.0–0.5)
EOS%: 3.6 % (ref 0.0–7.0)
HEMATOCRIT: 38.4 % (ref 34.8–46.6)
HEMOGLOBIN: 13.6 g/dL (ref 11.6–15.9)
LYMPH#: 1.9 10*3/uL (ref 0.9–3.3)
LYMPH%: 29.5 % (ref 14.0–48.0)
MCH: 35.8 pg — ABNORMAL HIGH (ref 26.0–34.0)
MCHC: 35.4 g/dL (ref 32.0–36.0)
MCV: 101 fL (ref 81–101)
MONO#: 0.6 10*3/uL (ref 0.1–0.9)
MONO%: 8.5 % (ref 0.0–13.0)
NEUT%: 58.1 % (ref 39.6–80.0)
NEUTROS ABS: 3.7 10*3/uL (ref 1.5–6.5)
Platelets: 257 10*3/uL (ref 145–400)
RBC: 3.8 10*6/uL (ref 3.70–5.32)
RDW: 15.7 % (ref 11.1–15.7)
WBC: 6.4 10*3/uL (ref 3.9–10.0)

## 2015-08-25 LAB — COMPREHENSIVE METABOLIC PANEL
ALBUMIN: 4.1 g/dL (ref 3.5–5.0)
ALK PHOS: 52 U/L (ref 40–150)
ALT: 21 U/L (ref 0–55)
ANION GAP: 10 meq/L (ref 3–11)
AST: 20 U/L (ref 5–34)
BUN: 16.9 mg/dL (ref 7.0–26.0)
CALCIUM: 9.3 mg/dL (ref 8.4–10.4)
CHLORIDE: 107 meq/L (ref 98–109)
CO2: 23 mEq/L (ref 22–29)
CREATININE: 0.8 mg/dL (ref 0.6–1.1)
EGFR: 87 mL/min/{1.73_m2} — ABNORMAL LOW (ref >90)
Glucose: 83 mg/dl (ref 70–140)
POTASSIUM: 4.7 meq/L (ref 3.5–5.1)
Sodium: 141 mEq/L (ref 136–145)
Total Bilirubin: 0.94 mg/dL (ref 0.20–1.20)
Total Protein: 7 g/dL (ref 6.4–8.3)

## 2015-08-25 NOTE — Progress Notes (Signed)
Hematology and Oncology Follow Up Visit  Carla Mills [id number] March 29, 1970 45 y.o. 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 She looks really good. She really hasn't had many problems with the Xeloda. She really has had very few  side effects. Her hands and feet have taken the most abuse from the Xeloda. She has some erythematous and cracking of the skin on her hands. There is as much as on her feet. There is no neuropathic issues.  Her colostomy is working quite well. The Xeloda is not causing any additional problems.  She changes the colostomy every 4 days.  Her appetite has been good. She's had no nausea or vomiting. She's had no mouth sores.  There's been no bleeding.  Overall, her performance status is ECOG 1.  Medications:  Current outpatient prescriptions:  .  capecitabine (XELODA) 500 MG tablet, Take 4 pills twice a day with food for 14 days, then off for 7 days., Disp: 112 tablet, Rfl: 8 .  fexofenadine (ALLEGRA) 180 MG tablet, Take 180 mg by mouth daily as needed for allergies. , Disp: , Rfl:  .  fluticasone (FLONASE) 50 MCG/ACT nasal spray, Place 2 sprays into the nose daily., Disp: 16 g, Rfl: 12 .  ibuprofen (ADVIL,MOTRIN) 200 MG tablet, Take 400 mg by mouth every 6 (six) hours as needed for headache or mild pain., Disp: , Rfl:  .  ondansetron (ZOFRAN ODT) 8 MG disintegrating tablet, Take 1 pill 30 minutes before chemotherapy, Disp: 30 tablet, Rfl: 4 .  prochlorperazine (COMPAZINE) 10 MG tablet, Take 1 tablet (10 mg total) by mouth every 6 (six) hours as needed for nausea or vomiting., Disp: 60 tablet, Rfl: 5 .  Pseudoephedrine HCl (PSEUDO PO), Take 2 tablets by mouth daily as needed (congestion)., Disp: , Rfl:   Allergies:  Allergies  Allergen Reactions  . Bactrim  [Sulfamethoxazole-Trimethoprim]   . Oxaprozin Hives    REACTION: Hives  . Penicillins Hives    REACTION: Hives  . Sulfonamide Derivatives Swelling    Massive extremity swelling     Past Medical History, Surgical history, Social history, and Family History were reviewed and updated.  Review of Systems: As above  Physical Exam:  weight is 152 lb (68.947 kg). Her oral temperature is 97.9 F (36.6 C). Her blood pressure is 113/72 and her pulse is 70. Her respiration is 16.   Wt Readings from Last 3 Encounters:  08/25/15 152 lb (68.947 kg)  08/03/15 148 lb (67.132 kg)  07/13/15 145 lb (65.772 kg)     Head and neck exam shows no ocular or oral lesions. She has no palpable cervical or supraclavicular lymph nodes. Lungs are clear to percussion and auscultation bilaterally. Cardiac exam regular rate and rhythm with no murmurs, rubs or bruits. Abdomen is soft. She has good bowel sounds. She has the well-healed laparoscopy scars. She has colostomy in the left lower quadrant. There is no palpable liver or spleen tip. Back exam shows no tenderness over the spine, ribs or hips. Extremity shows no clubbing, cyanosis or edema. Neurological exam shows no focal neurological deficit. Skin exam shows no rashes, ecchymoses or petechia.   Lab Results  Component Value Date   WBC 6.4 08/25/2015   HGB 13.6 08/25/2015   HCT 38.4 08/25/2015  MCV 101 08/25/2015   PLT 257 08/25/2015     Chemistry      Component Value Date/Time   NA 143 08/03/2015 1200   NA 141 07/13/2015 0754   NA 139 02/22/2015 0425   K 4.4 08/03/2015 1200   K 4.4 07/13/2015 0754   K 4.0 02/22/2015 0425   CL 105 08/03/2015 1200   CL 106 02/22/2015 0425   CO2 27 08/03/2015 1200   CO2 24 07/13/2015 0754   CO2 29 02/22/2015 0425   BUN 15 08/03/2015 1200   BUN 16.6 07/13/2015 0754   BUN <5* 02/22/2015 0425   CREATININE 1.0 08/03/2015 1200   CREATININE 0.8 07/13/2015 0754   CREATININE 0.45 02/22/2015 0425      Component Value  Date/Time   CALCIUM 9.5 08/03/2015 1200   CALCIUM 9.3 07/13/2015 0754   CALCIUM 8.3* 02/22/2015 0425   ALKPHOS 55 08/03/2015 1200   ALKPHOS 63 07/13/2015 0754   ALKPHOS 76 02/10/2015 1703   AST 31 08/03/2015 1200   AST 31 07/13/2015 0754   AST 15 02/10/2015 1703   ALT 36 08/03/2015 1200   ALT 36 07/13/2015 0754   ALT 14 02/10/2015 1703   BILITOT 1.50 08/03/2015 1200   BILITOT 1.36* 07/13/2015 0754   BILITOT 0.3 02/10/2015 1703         Impression and Plan: Carla Mills is a 45 year old white female. She has a stage IIB adenocarcinoma of the sigmoid colon.again, her Oncotype score is a lot higher than I thought it would be.  We will proceed with her Eighth and final cycle of Xeloda now. She's done quite well.  Her hands do look a little more erythematous and cracking. She is very thorough in applying lotion and cream to her hands and feet.  At this point, I will plan to get her back in 6 weeks. We'll do CT scan the same day that we see her.  I think that she probably will be able to have her colostomy reversed after this. I think that her blood counts should be okay.  She has done very well. She has had no problems with treatment.     Volanda Napoleon, MD 6/2/20179:17 AM

## 2015-08-29 ENCOUNTER — Other Ambulatory Visit (HOSPITAL_COMMUNITY): Payer: Self-pay | Admitting: General Surgery

## 2015-08-29 DIAGNOSIS — Z933 Colostomy status: Secondary | ICD-10-CM

## 2015-08-31 ENCOUNTER — Encounter: Payer: Self-pay | Admitting: *Deleted

## 2015-09-01 ENCOUNTER — Ambulatory Visit: Payer: Self-pay | Admitting: General Surgery

## 2015-09-01 MED ORDER — DEXTROSE 5 % IV SOLN: 900.0000 mg | INTRAVENOUS | Status: DC

## 2015-09-01 MED ORDER — DEXTROSE 5 % IV SOLN: 5.0000 mg/kg | INTRAVENOUS | Status: DC

## 2015-09-01 NOTE — H&P (Signed)
History of Present Illness [name] MD; 08/25/2015 3:23 PM) Patient words: discuss surgery.  The patient is a 45 year old female who presents with a complaint of Hartman's colostomy. 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 Patient's been doing well with her chemotherapy. She has one round of Xeloda left which will be done in 2 weeks.. Patient sees Dr. Marin Olp. Patient undergo CT scan July 18.  Patient's had no issues with her colostomy.  _____________________________ 45 year old female status post LAR and Hartman's pouch with end colostomy. Patient's been doing well postoperatively. Patient is seeing Dr. Marin Olp her oncologist. There is no plans at this time for any postoperative chemotherapy/radiation therapy. Patient is due for an iron infusion.  Patient states she's been doing well with minimal pain. She has normal appetite. Ostomy is been working well.    Allergies (Ammie Eversole, LPN; 624THL 579FGE PM) Penicillin G Potassium *PENICILLINS* Sulfacetamide *CHEMICALS* Daypro *ANALGESICS - ANTI-INFLAMMATORY*  Medication History (Ammie Eversole, LPN; 624THL 579FGE PM) Fluconazole (150MG  Tablet, Oral) Active. Medications Reconciled  Vitals (Ammie Eversole LPN; 624THL 579FGE PM) 08/25/2015 2:58 PM Height: 65.25in BP: 138/78 (Sitting, Left Arm, Standard)       Physical Exam [name] MD; 08/25/2015 3:23 PM) Abdomen Note: Midline incision well healed, ostomy patent.     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  We will plan for laparoscopic lysis of adhesions and colostomy reversal the beginning of August. 2. I discussed with the patient the risks and benefits of the procedure to include but not limited to: Infection, bleeding, damage to structures, possible  need for further surgery and possible hernia. Patient was understanding and wished to proceed.

## 2015-09-05 ENCOUNTER — Ambulatory Visit (HOSPITAL_COMMUNITY)
Admission: RE | Admit: 2015-09-05 | Discharge: 2015-09-05 | Disposition: A | Discharge status: Home / Self Care | Payer: BLUE CROSS/BLUE SHIELD | Source: Ambulatory Visit | Attending: General Surgery | Admitting: General Surgery

## 2015-09-05 DIAGNOSIS — Z933 Colostomy status: Secondary | ICD-10-CM

## 2015-09-07 ENCOUNTER — Other Ambulatory Visit: Payer: Self-pay | Admitting: General Surgery

## 2015-10-09 ENCOUNTER — Encounter (HOSPITAL_COMMUNITY): Payer: Self-pay | Admitting: *Deleted

## 2015-10-09 ENCOUNTER — Ambulatory Visit (HOSPITAL_COMMUNITY)
Admission: RE | Admit: 2015-10-09 | Discharge: 2015-10-09 | Disposition: A | Discharge status: Home / Self Care | Payer: BLUE CROSS/BLUE SHIELD | Source: Ambulatory Visit | Attending: General Surgery | Admitting: General Surgery

## 2015-10-09 ENCOUNTER — Encounter (HOSPITAL_COMMUNITY)
Admission: RE | Disposition: A | Discharge status: Home / Self Care | Payer: Self-pay | Source: Ambulatory Visit | Attending: General Surgery

## 2015-10-09 DIAGNOSIS — Z9049 Acquired absence of other specified parts of digestive tract: Secondary | ICD-10-CM | POA: Diagnosis not present

## 2015-10-09 DIAGNOSIS — Z85038 Personal history of other malignant neoplasm of large intestine: Secondary | ICD-10-CM | POA: Insufficient documentation

## 2015-10-09 DIAGNOSIS — Z1211 Encounter for screening for malignant neoplasm of colon: Secondary | ICD-10-CM | POA: Diagnosis not present

## 2015-10-09 DIAGNOSIS — Z933 Colostomy status: Secondary | ICD-10-CM | POA: Diagnosis not present

## 2015-10-09 DIAGNOSIS — Z9221 Personal history of antineoplastic chemotherapy: Secondary | ICD-10-CM | POA: Insufficient documentation

## 2015-10-09 HISTORY — PX: COLONOSCOPY: SHX5424

## 2015-10-09 SURGERY — COLONOSCOPY
Anesthesia: Moderate Sedation

## 2015-10-09 MED ORDER — MIDAZOLAM HCL 5 MG/ML IJ SOLN
INTRAMUSCULAR | Status: AC
  Filled 2015-10-09: qty 2

## 2015-10-09 MED ORDER — MIDAZOLAM HCL 10 MG/2ML IJ SOLN
INTRAMUSCULAR | Status: DC | PRN
  Administered 2015-10-09 (×2): 2 mg via INTRAVENOUS

## 2015-10-09 MED ORDER — FENTANYL CITRATE (PF) 100 MCG/2ML IJ SOLN
INTRAMUSCULAR | Status: AC
  Filled 2015-10-09: qty 2

## 2015-10-09 MED ORDER — SODIUM CHLORIDE 0.9 % IV SOLN
INTRAVENOUS | Status: DC
  Administered 2015-10-09 (×2): 500 mL via INTRAVENOUS

## 2015-10-09 MED ORDER — FENTANYL CITRATE (PF) 100 MCG/2ML IJ SOLN
INTRAMUSCULAR | Status: DC | PRN
  Administered 2015-10-09 (×2): 25 ug via INTRAVENOUS

## 2015-10-09 NOTE — H&P (Signed)
HPI: 45 y.o. F with a h/o hartman's resection for obstructing sigmoid colon mass.  She has completed chemotherapy and is ready for her reversal surgery.  She is here today for completion colonoscopy.    Past Medical History  Diagnosis Date  . Rectal mass 02/20/15    Rectal/sigmoid mass  . Colonic cancer ([place]) 03/09/2015    Past Surgical History  Procedure Laterality Date  . Flexible sigmoidoscopy N/A 02/15/2015    Procedure: FLEXIBLE SIGMOIDOSCOPY;  Surgeon: [name], MD;  Location: [facility] ENDOSCOPY;  Service: Endoscopy;  Laterality: N/A;  . Appendectomy  02/20/15    Abnormal appearing  . Laparoscopy abdomen diagnostic  02/20/15    Converted to open - Sigmoid/rectal mass  . Low anterior bowel resection  02/20/15    Sigmoid/rectal mass  . Colostomy  02/20/15    Sigmoid/rectal mass  . Exploratory laparotomy  02/20/15    Sigmoid/rectal mass  . Laparoscopy N/A 02/20/2015    Procedure: LAPAROSCOPY DIAGNOSTIC;  Surgeon: [name], MD;  Location: [place];  Service: General;  Laterality: N/A;  . Laparotomy N/A 02/20/2015    Procedure: EXPLORATORY LAPAROTOMY AND PARTIAL COLECTOMY;  Surgeon: [name], MD;  Location: [place];  Service: General;  Laterality: N/A;  . Colostomy N/A 02/20/2015    Procedure: COLOSTOMY;  Surgeon: [name], MD;  Location: [place];  Service: General;  Laterality: N/A;   Current Meds  Medication Sig  . ibuprofen (ADVIL,MOTRIN) 200 MG tablet Take 400 mg by mouth every 6 (six) hours as needed for headache or mild pain.   Allergies  Allergen Reactions  . Bactrim [Sulfamethoxazole-Trimethoprim]   . Oxaprozin Hives    REACTION: Hives  . Penicillins Hives    REACTION: Hives  . Sulfonamide Derivatives Swelling    Massive extremity swelling    History reviewed. No pertinent family history. Social History   Social History  . Marital Status: Married    Spouse Name: N/A  . Number of Children: N/A  . Years of Education: N/A   Occupational History   . Not on file.   Social History Main Topics  . Smoking status: Never Smoker   . Smokeless tobacco: Not on file  . Alcohol Use: 0.0 oz/week    0 Standard drinks or equivalent per week  . Drug Use: Not on file  . Sexual Activity: Not on file   Other Topics Concern  . Not on file   Social History Narrative   Review of Systems - General ROS: negative for - chills or fever Hematological and Lymphatic ROS: negative for - bleeding problems, blood clots or blood transfusions Respiratory ROS: no cough, shortness of breath, or wheezing Cardiovascular ROS: no chest pain or dyspnea on exertion Gastrointestinal ROS: no abdominal pain, change in bowel habits, or black or bloody stools Genito-Urinary ROS: no dysuria, trouble voiding, or hematuria   Physical Exam  Pulse 68  Temp(Src) 98.1 F (36.7 C) (Oral)  Resp 18  Ht 5\' 5"  (1.651 m)  Wt 68.947 kg (152 lb)  BMI 25.29 kg/m2   Physical Exam  Constitutional: She is oriented to person, place, and time. She appears well-developed and well-nourished.  HENT:  Head: Normocephalic and atraumatic.  Eyes: Conjunctivae and EOM are normal. Pupils are equal, round, and reactive to light.  Neck: Normal range of motion. Neck supple.  Cardiovascular: Normal rate, regular rhythm and normal heart sounds.   Pulmonary/Chest: Effort normal and breath sounds normal.  Abdominal: Soft. She exhibits no distension.  Musculoskeletal: Normal range  of motion.  Neurological: She is alert and oriented to person, place, and time.  Skin: Skin is warm and dry.     Assessment: Colon cancer, s/p resection.  Here for colonoscopy.   Plan: Colonoscopy through ostomy today.  Risks of procedure discussed with patient.  These include bleeding, missing pathology, incomplete colonoscopy and perforation, as well as risks associated with sedation. These risks are all low and I believe do not outweigh the benefits of the procedure.

## 2015-10-09 NOTE — Op Note (Signed)
[facility] Patient Name: Carla Mills Procedure Date: 10/09/2015 MRN: [id number] Attending MD: Leighton Ruff , MD Date of Birth: 11-30-1970 CSN: [id number] Age: 45 Admit Type: Outpatient Procedure:                Colonoscopy Indications:              High risk colon cancer surveillance: Personal                            history of colon cancer Providers:                Leighton Ruff, MD, Malka So, RN, Cherylynn Ridges, Technician Referring MD:             Dr [name] Medicines:                Fentanyl 50 micrograms IV, Midazolam 4 mg IV Complications:            No immediate complications. Estimated Blood Loss:     Estimated blood loss: none. Procedure:                Pre-Anesthesia Assessment:                           - Prior to the procedure, a History and Physical                            was performed, and patient medications and                            allergies were reviewed. The patient is competent.                            The risks and benefits of the procedure and the                            sedation options and risks were discussed with the                            patient. All questions were answered and informed                            consent was obtained. Patient identification and                            proposed procedure were verified by the physician,                            the nurse and the technician in the pre-procedure                            area in the procedure room. Mental Status  Examination: alert and oriented. Airway                            Examination: normal oropharyngeal airway and neck                            mobility. Respiratory Examination: clear to                            auscultation. CV Examination: normal. Prophylactic                            Antibiotics: The patient does not require   prophylactic antibiotics. Prior Anticoagulants: The                            patient has taken no previous anticoagulant or                            antiplatelet agents. ASA Grade Assessment: I - A                            normal, healthy patient. After reviewing the risks                            and benefits, the patient was deemed in                            satisfactory condition to undergo the procedure.                            The anesthesia plan was to use moderate sedation /                            analgesia (conscious sedation). Immediately prior                            to administration of medications, the patient was                            re-assessed for adequacy to receive sedatives. The                            heart rate, respiratory rate, oxygen saturations,                            blood pressure, adequacy of pulmonary ventilation,                            and response to care were monitored throughout the                            procedure. The physical status of the patient was  re-assessed after the procedure.                           After obtaining informed consent, the colonoscope                            was passed under direct vision. Throughout the                            procedure, the patient's blood pressure, pulse, and                            oxygen saturations were monitored continuously. The                            [id number]) scope was introduced through                            the anus and advanced to the the cecum, identified                            by appendiceal orifice and ileocecal valve. The                            colonoscopy was performed with ease. The patient                            tolerated the procedure well. The quality of the                            bowel preparation was excellent. The ileocecal                            valve, appendiceal orifice, and  rectum were                            photographed. Scope withdrawal time was 11 minutes. Scope In: 9:45:02 AM Scope Out: 10:09:02 AM Scope Withdrawal Time: 0 hours 10 minutes 20 seconds  Total Procedure Duration: 0 hours 24 minutes 0 seconds  Findings:      The perianal and digital rectal examinations were normal. Pertinent       negatives include normal sphincter tone.      The entire examined colon appeared normal. Impression:               - The entire examined colon is normal.                           - No specimens collected.                           - Personal history of malignant neoplasm of the                            colon. Moderate Sedation:      Moderate (conscious) sedation was  administered by the endoscopy nurse       and supervised by the endoscopist. The following parameters were       monitored: oxygen saturation, heart rate, blood pressure, and response       to care. Recommendation:           - Discharge patient to home (ambulatory).                           - Written discharge instructions were provided to                            the patient.                           - Resume previous diet today.                           - Continue present medications.                           - Repeat colonoscopy in 1 year for surveillance                            based on personal history of colon cancer.                           - Return to my office in 1 year. Procedure Code(s):        --- Professional ---                           [id number], Colorectal cancer screening; colonoscopy on                            individual at high risk Diagnosis Code(s):        --- Professional ---                           [id number], Personal history of other malignant                            neoplasm of large intestine CPT copyright 2016 [employer]. All rights reserved. The codes documented in this report are preliminary and upon coder review may  be revised  to meet current compliance requirements. Leighton Ruff, MD Leighton Ruff, MD [id number] 10:26:12 AM This report has been signed electronically. Number of Addenda: 0

## 2015-10-09 NOTE — Discharge Instructions (Signed)

## 2015-10-10 ENCOUNTER — Other Ambulatory Visit (HOSPITAL_BASED_OUTPATIENT_CLINIC_OR_DEPARTMENT_OTHER): Payer: BLUE CROSS/BLUE SHIELD

## 2015-10-10 ENCOUNTER — Encounter: Payer: Self-pay | Admitting: Hematology & Oncology

## 2015-10-10 ENCOUNTER — Ambulatory Visit (HOSPITAL_BASED_OUTPATIENT_CLINIC_OR_DEPARTMENT_OTHER): Payer: BLUE CROSS/BLUE SHIELD | Admitting: Hematology & Oncology

## 2015-10-10 ENCOUNTER — Ambulatory Visit (HOSPITAL_BASED_OUTPATIENT_CLINIC_OR_DEPARTMENT_OTHER)
Admission: RE | Admit: 2015-10-10 | Discharge: 2015-10-10 | Disposition: A | Discharge status: Home / Self Care | Payer: BLUE CROSS/BLUE SHIELD | Source: Ambulatory Visit | Attending: Hematology & Oncology | Admitting: Hematology & Oncology

## 2015-10-10 VITALS — BP 114/91 | HR 74 | Temp 97.3°F | Resp 16 | Ht 65.0 in | Wt 158.0 lb

## 2015-10-10 DIAGNOSIS — Z933 Colostomy status: Secondary | ICD-10-CM

## 2015-10-10 DIAGNOSIS — C186 Malignant neoplasm of descending colon: Secondary | ICD-10-CM | POA: Insufficient documentation

## 2015-10-10 DIAGNOSIS — C187 Malignant neoplasm of sigmoid colon: Secondary | ICD-10-CM | POA: Diagnosis not present

## 2015-10-10 DIAGNOSIS — N8311 Corpus luteum cyst of right ovary: Secondary | ICD-10-CM | POA: Diagnosis not present

## 2015-10-10 DIAGNOSIS — C20 Malignant neoplasm of rectum: Secondary | ICD-10-CM | POA: Diagnosis not present

## 2015-10-10 DIAGNOSIS — C184 Malignant neoplasm of transverse colon: Secondary | ICD-10-CM

## 2015-10-10 LAB — CBC WITH DIFFERENTIAL (CANCER CENTER ONLY)
BASO#: 0 10*3/uL (ref 0.0–0.2)
BASO%: 0.2 % (ref 0.0–2.0)
EOS ABS: 0.1 10*3/uL (ref 0.0–0.5)
EOS%: 1.4 % (ref 0.0–7.0)
HCT: 44.5 % (ref 34.8–46.6)
HGB: 15.6 g/dL (ref 11.6–15.9)
LYMPH#: 1.6 10*3/uL (ref 0.9–3.3)
LYMPH%: 18.1 % (ref 14.0–48.0)
MCH: 33.8 pg (ref 26.0–34.0)
MCHC: 35.1 g/dL (ref 32.0–36.0)
MCV: 96 fL (ref 81–101)
MONO#: 0.7 10*3/uL (ref 0.1–0.9)
MONO%: 7.5 % (ref 0.0–13.0)
NEUT#: 6.4 10*3/uL (ref 1.5–6.5)
NEUT%: 72.8 % (ref 39.6–80.0)
PLATELETS: 264 10*3/uL (ref 145–400)
RBC: 4.62 10*6/uL (ref 3.70–5.32)
RDW: 12.8 % (ref 11.1–15.7)
WBC: 8.8 10*3/uL (ref 3.9–10.0)

## 2015-10-10 LAB — CMP (CANCER CENTER ONLY)
ALT(SGPT): 21 U/L (ref 10–47)
AST: 22 U/L (ref 11–38)
Albumin: 4.2 g/dL (ref 3.3–5.5)
Alkaline Phosphatase: 46 U/L (ref 26–84)
BILIRUBIN TOTAL: 1.1 mg/dL (ref 0.20–1.60)
BUN: 12 mg/dL (ref 7–22)
CALCIUM: 8.9 mg/dL (ref 8.0–10.3)
CO2: 25 meq/L (ref 18–33)
Chloride: 104 mEq/L (ref 98–108)
Creat: 0.5 mg/dl — ABNORMAL LOW (ref 0.6–1.2)
GLUCOSE: 85 mg/dL (ref 73–118)
Potassium: 3.6 mEq/L (ref 3.3–4.7)
SODIUM: 135 meq/L (ref 128–145)
Total Protein: 7 g/dL (ref 6.4–8.1)

## 2015-10-10 MED ORDER — IOPAMIDOL (ISOVUE-300) INJECTION 61%
100.0000 mL | Freq: Once | INTRAVENOUS | Status: AC | PRN
  Administered 2015-10-10: 100 mL via INTRAVENOUS

## 2015-10-10 NOTE — Progress Notes (Signed)
Hematology and Oncology Follow Up Visit  Carla Mills [id number] 1970-12-31 44 y.o. 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 She looks really good. She really hasn't had many problems with the Xeloda. She really has had very few  side effects. Her hands and feet have taken the most abuse from the Xeloda.   We did go ahead and do a CT scan on her today. The CT scan did not show any evidence of recurrent/residual disease.  She is supposed to have the colostomy reversal and a month or so. I will call the surgeon to see this can be moved up until the early part of August. This be a lot better for her.  She still tries to active. She does walking.  Unfortunately, her left arm is quite swollen from infiltration of the IV that she had for the CT scan. This will get better over time.  Her colostomy is doing okay. She has tolerated this quite well.  Her last CEA level back in April was 1.7.  She's had no cough or shortness of breath. Has been no bleeding. She's had no fever. She had no leg swelling.  Overall, her performance status is ECOG 1.  Medications:  Current outpatient prescriptions:  .  capecitabine (XELODA) 500 MG tablet, Take 4 pills twice a day with food for 14 days, then off for 7 days., Disp: 112 tablet, Rfl: 8 .  fexofenadine (ALLEGRA) 180 MG tablet, Take 180 mg by mouth daily as needed for allergies. , Disp: , Rfl:  .  fluticasone (FLONASE) 50 MCG/ACT nasal spray, Place 2 sprays into the nose daily., Disp: 16 g, Rfl: 12 .  ibuprofen (ADVIL,MOTRIN) 200 MG tablet, Take 400 mg by mouth every 6 (six) hours as needed for headache or mild pain., Disp: , Rfl:  .  ondansetron (ZOFRAN ODT) 8 MG disintegrating tablet, Take 1 pill 30 minutes before chemotherapy, Disp: 30 tablet, Rfl:  4 .  prochlorperazine (COMPAZINE) 10 MG tablet, Take 1 tablet (10 mg total) by mouth every 6 (six) hours as needed for nausea or vomiting., Disp: 60 tablet, Rfl: 5 .  Pseudoephedrine HCl (PSEUDO PO), Take 2 tablets by mouth daily as needed (congestion)., Disp: , Rfl:  No current facility-administered medications for this visit.  Facility-Administered Medications Ordered in Other Visits:  .  clindamycin (CLEOCIN) 900 mg in dextrose 5 % 50 mL IVPB, 900 mg, Intravenous, 60 min Pre-Op **AND** gentamicin (GARAMYCIN) 340 mg in dextrose 5 % 50 mL IVPB, 5 mg/kg, Intravenous, 60 min Pre-Op, [name], MD  Allergies:  Allergies  Allergen Reactions  . Bactrim [Sulfamethoxazole-Trimethoprim]   . Oxaprozin Hives    REACTION: Hives  . Penicillins Hives    REACTION: Hives  . Sulfonamide Derivatives Swelling    Massive extremity swelling     Past Medical History, Surgical history, Social history, and Family History were reviewed and updated.  Review of Systems: As above  Physical Exam:  height is 5\' 5"  (1.651 m) and weight is 158 lb (71.668 kg). Her oral temperature is 97.3 F (36.3 C). Her blood pressure is 114/91 and her pulse is 74. Her respiration is 16.   Wt Readings from Last 3 Encounters:  10/10/15 158 lb (71.668 kg)  10/09/15 152 lb (68.947 kg)  08/25/15 152 lb (68.947 kg)     Head and neck exam shows no ocular or oral lesions. She has no palpable cervical or supraclavicular lymph nodes. Lungs are clear to percussion and auscultation bilaterally. Cardiac exam regular rate and rhythm with no murmurs, rubs or bruits. Abdomen is soft. She has good bowel sounds. She has the well-healed laparoscopy scars. She has colostomy in the left lower quadrant. There is no palpable liver or spleen tip. Back exam shows no tenderness over the spine, ribs or hips. Extremity shows no clubbing, cyanosis or edema. Neurological exam shows no focal neurological deficit. Skin exam shows no rashes,  ecchymoses or petechia.   Lab Results  Component Value Date   WBC 6.4 08/25/2015   HGB 13.6 08/25/2015   HCT 38.4 08/25/2015   MCV 101 08/25/2015   PLT 257 08/25/2015     Chemistry      Component Value Date/Time   NA 141 08/25/2015 0820   NA 143 08/03/2015 1200   NA 139 02/22/2015 0425   K 4.7 08/25/2015 0820   K 4.4 08/03/2015 1200   K 4.0 02/22/2015 0425   CL 105 08/03/2015 1200   CL 106 02/22/2015 0425   CO2 23 08/25/2015 0820   CO2 27 08/03/2015 1200   CO2 29 02/22/2015 0425   BUN 16.9 08/25/2015 0820   BUN 15 08/03/2015 1200   BUN <5* 02/22/2015 0425   CREATININE 0.8 08/25/2015 0820   CREATININE 1.0 08/03/2015 1200   CREATININE 0.45 02/22/2015 0425      Component Value Date/Time   CALCIUM 9.3 08/25/2015 0820   CALCIUM 9.5 08/03/2015 1200   CALCIUM 8.3* 02/22/2015 0425   ALKPHOS 52 08/25/2015 0820   ALKPHOS 55 08/03/2015 1200   ALKPHOS 76 02/10/2015 1703   AST 20 08/25/2015 0820   AST 31 08/03/2015 1200   AST 15 02/10/2015 1703   ALT 21 08/25/2015 0820   ALT 36 08/03/2015 1200   ALT 14 02/10/2015 1703   BILITOT 0.94 08/25/2015 0820   BILITOT 1.50 08/03/2015 1200   BILITOT 0.3 02/10/2015 1703         Impression and Plan: Carla Mills is a 45 year old white female. She has a stage IIB adenocarcinoma of the sigmoid colon.again, her Oncotype score is a lot higher than I thought it would be.  It is reassuring that her CT scan does not show any evidence of recurrent disease. I suppose that she probably is at higher risk for recurrence.  We probably need to repeat her scan in about 4 months. I think this would be reasonable.  I do not see positive her having the colostomy reversal. Over this can be done in a couple weeks.  I will plan to see her back in 4 months when we do the scan the same day.   Volanda Napoleon, MD 7/18/201712:27 PM

## 2015-10-11 LAB — CEA: CEA: 2.9 ng/mL (ref 0.0–4.7)

## 2015-10-11 LAB — CEA (PARALLEL TESTING): CEA: 1.3 ng/mL

## 2015-10-30 ENCOUNTER — Encounter (HOSPITAL_COMMUNITY): Payer: Self-pay

## 2015-10-30 NOTE — Patient Instructions (Addendum)
Carla Mills  10/30/2015   Your procedure is scheduled on: 11/15/2015    Report to [facility] Main  Entrance take [facility]  elevators to 3rd floor to  [place] at   1030 AM.  Call this number if you have problems the morning of surgery [phone]   Remember: ONLY 1 PERSON MAY GO WITH YOU TO SHORT STAY TO GET  READY MORNING OF [place].  Do not eat food or drink liquids :After Midnight.  Drink plenty of clear liquids on day of bowel prep.     Take these medicines the morning of surgery with A SIP OF WATER: Allegra ,                                You may not have any metal on your body including hair pins and              piercings  Do not wear jewelry, make-up, lotions, powders or perfumes, deodorant             Do not wear nail polish.  Do not shave  48 hours prior to surgery.                Do not bring valuables to the hospital. [place].  Contacts, dentures or bridgework may not be worn into surgery.  Leave suitcase in the car. After surgery it may be brought to your room.        Special Instructions: coughing and deep breathing exercises, leg exercises               Please read over the following fact sheets you were given: _____________________________________________________________________             [facility] - Preparing for Surgery Before surgery, you can play an important role.  Because skin is not sterile, your skin needs to be as free of germs as possible.  You can reduce the number of germs on your skin by washing with CHG (chlorahexidine gluconate) soap before surgery.  CHG is an antiseptic cleaner which kills germs and bonds with the skin to continue killing germs even after washing. Please DO NOT use if you have an allergy to CHG or antibacterial soaps.  If your skin becomes reddened/irritated stop using the CHG and inform your nurse when you arrive at Short Stay. Do not  shave (including legs and underarms) for at least 48 hours prior to the first CHG shower.  You may shave your face/neck. Please follow these instructions carefully:  1.  Shower with CHG Soap the night before surgery and the  morning of Surgery.  2.  If you choose to wash your hair, wash your hair first as usual with your  normal  shampoo.  3.  After you shampoo, rinse your hair and body thoroughly to remove the  shampoo.                           4.  Use CHG as you would any other liquid soap.  You can apply chg directly  to the skin and wash  Gently with a scrungie or clean washcloth.  5.  Apply the CHG Soap to your body ONLY FROM THE NECK DOWN.   Do not use on face/ open                           Wound or open sores. Avoid contact with eyes, ears mouth and genitals (private parts).                       Wash face,  Genitals (private parts) with your normal soap.             6.  Wash thoroughly, paying special attention to the area where your surgery  will be performed.  7.  Thoroughly rinse your body with warm water from the neck down.  8.  DO NOT shower/wash with your normal soap after using and rinsing off  the CHG Soap.                9.  Pat yourself dry with a clean towel.            10.  Wear clean pajamas.            11.  Place clean sheets on your bed the night of your first shower and do not  sleep with pets. Day of Surgery : Do not apply any lotions/deodorants the morning of surgery.  Please wear clean clothes to the hospital/surgery center.  FAILURE TO FOLLOW THESE INSTRUCTIONS MAY RESULT IN THE CANCELLATION OF YOUR SURGERY PATIENT SIGNATURE_________________________________  NURSE SIGNATURE__________________________________  ________________________________________________________________________    CLEAR LIQUID DIET   Foods Allowed                                                                     Foods Excluded  Coffee and tea, regular and decaf                              liquids that you cannot  Plain Jell-O in any flavor                                             see through such as: Fruit ices (not with fruit pulp)                                     milk, soups, orange juice  Iced Popsicles                                    All solid food Carbonated beverages, regular and diet                                    Cranberry, grape and apple juices Sports drinks like Gatorade Lightly seasoned clear broth or consume(fat free) Sugar, honey syrup  Sample Menu Breakfast                                Lunch                                     Supper Cranberry juice                    Beef broth                            Chicken broth Jell-O                                     Grape juice                           Apple juice Coffee or tea                        Jell-O                                      Popsicle                                                Coffee or tea                        Coffee or tea  _____________________________________________________________________   WHAT IS A BLOOD TRANSFUSION? Blood Transfusion Information  A transfusion is the replacement of blood or some of its parts. Blood is made up of multiple cells which provide different functions.  Red blood cells carry oxygen and are used for blood loss replacement.  White blood cells fight against infection.  Platelets control bleeding.  Plasma helps clot blood.  Other blood products are available for specialized needs, such as hemophilia or other clotting disorders. BEFORE THE TRANSFUSION  Who gives blood for transfusions?   Healthy volunteers who are fully evaluated to make sure their blood is safe. This is blood bank blood. Transfusion therapy is the safest it has ever been in the practice of medicine. Before blood is taken from a donor, a complete history is taken to make sure that person has no history of diseases nor engages in risky social  behavior (examples are intravenous drug use or sexual activity with multiple partners). The donor's travel history is screened to minimize risk of transmitting infections, such as malaria. The donated blood is tested for signs of infectious diseases, such as HIV and hepatitis. The blood is then tested to be sure it is compatible with you in order to minimize the chance of a transfusion reaction. If you or a relative donates blood, this is often done in anticipation of surgery and is not appropriate for emergency situations. It takes many days to process the donated blood. RISKS AND COMPLICATIONS Although transfusion therapy is very safe and saves many lives, the main dangers of transfusion include:  Getting an infectious disease.  Developing a transfusion reaction. This is an allergic reaction to something in the blood you were given. Every precaution is taken to prevent this. The decision to have a blood transfusion has been considered carefully by your caregiver before blood is given. Blood is not given unless the benefits outweigh the risks. AFTER THE TRANSFUSION  Right after receiving a blood transfusion, you will usually feel much better and more energetic. This is especially true if your red blood cells have gotten low (anemic). The transfusion raises the level of the red blood cells which carry oxygen, and this usually causes an energy increase.  The nurse administering the transfusion will monitor you carefully for complications. HOME CARE INSTRUCTIONS  No special instructions are needed after a transfusion. You may find your energy is better. Speak with your caregiver about any limitations on activity for underlying diseases you may have. SEEK MEDICAL CARE IF:   Your condition is not improving after your transfusion.  You develop redness or irritation at the intravenous (IV) site. SEEK IMMEDIATE MEDICAL CARE IF:  Any of the following symptoms occur over the next 12 hours:  Shaking  chills.  You have a temperature by mouth above 102 F (38.9 C), not controlled by medicine.  Chest, back, or muscle pain.  People around you feel you are not acting correctly or are confused.  Shortness of breath or difficulty breathing.  Dizziness and fainting.  You get a rash or develop hives.  You have a decrease in urine output.  Your urine turns a dark color or changes to pink, red, or brown. Any of the following symptoms occur over the next 10 days:  You have a temperature by mouth above 102 F (38.9 C), not controlled by medicine.  Shortness of breath.  Weakness after normal activity.  The white part of the eye turns yellow (jaundice).  You have a decrease in the amount of urine or are urinating less often.  Your urine turns a dark color or changes to pink, red, or brown. Document Released: 03/08/2000 Document Revised: 06/03/2011 Document Reviewed: 10/26/2007 [facility] Patient Information 2014 [place], [place].  _______________________________________________________________________

## 2015-11-02 ENCOUNTER — Encounter (HOSPITAL_COMMUNITY): Payer: Self-pay

## 2015-11-02 ENCOUNTER — Encounter (HOSPITAL_COMMUNITY)
Admission: RE | Admit: 2015-11-02 | Discharge: 2015-11-02 | Disposition: A | Discharge status: Home / Self Care | Payer: BLUE CROSS/BLUE SHIELD | Source: Ambulatory Visit | Attending: General Surgery | Admitting: General Surgery

## 2015-11-02 DIAGNOSIS — Z01812 Encounter for preprocedural laboratory examination: Secondary | ICD-10-CM | POA: Insufficient documentation

## 2015-11-02 HISTORY — DX: Personal history of antineoplastic chemotherapy: Z92.21

## 2015-11-02 HISTORY — DX: Other specified postprocedural states: Z98.890

## 2015-11-02 HISTORY — DX: Nausea with vomiting, unspecified: R11.2

## 2015-11-02 LAB — BASIC METABOLIC PANEL
ANION GAP: 5 (ref 5–15)
BUN: 13 mg/dL (ref 6–20)
CHLORIDE: 106 mmol/L (ref 101–111)
CO2: 27 mmol/L (ref 22–32)
Calcium: 9.1 mg/dL (ref 8.9–10.3)
Creatinine, Ser: 0.62 mg/dL (ref 0.44–1.00)
GFR calc Af Amer: >60 mL/min (ref >60)
Glucose, Bld: 84 mg/dL (ref 65–99)
POTASSIUM: 4.3 mmol/L (ref 3.5–5.1)
SODIUM: 138 mmol/L (ref 135–145)

## 2015-11-02 LAB — CBC
HCT: 42.9 % (ref 36.0–46.0)
HEMOGLOBIN: 14.6 g/dL (ref 12.0–15.0)
MCH: 32.1 pg (ref 26.0–34.0)
MCHC: 34 g/dL (ref 30.0–36.0)
MCV: 94.3 fL (ref 78.0–100.0)
PLATELETS: 273 10*3/uL (ref 150–400)
RBC: 4.55 MIL/uL (ref 3.87–5.11)
RDW: 12.3 % (ref 11.5–15.5)
WBC: 7 10*3/uL (ref 4.0–10.5)

## 2015-11-02 LAB — TYPE AND SCREEN
ABO/RH(D): O POS
Antibody Screen: NEGATIVE

## 2015-11-02 LAB — ABO/RH: ABO/RH(D): O POS

## 2015-11-03 LAB — HEMOGLOBIN A1C
HEMOGLOBIN A1C: 4.8 % (ref 4.8–5.6)
MEAN PLASMA GLUCOSE: 91 mg/dL

## 2015-11-15 ENCOUNTER — Encounter (HOSPITAL_COMMUNITY): Payer: Self-pay | Admitting: *Deleted

## 2015-11-15 ENCOUNTER — Inpatient Hospital Stay (HOSPITAL_COMMUNITY): Payer: BLUE CROSS/BLUE SHIELD | Admitting: Registered Nurse

## 2015-11-15 ENCOUNTER — Encounter (HOSPITAL_COMMUNITY)
Admission: RE | Disposition: A | Discharge status: Home / Self Care | Payer: Self-pay | Source: Ambulatory Visit | Attending: General Surgery

## 2015-11-15 ENCOUNTER — Inpatient Hospital Stay (HOSPITAL_COMMUNITY)
Admission: RE | Admit: 2015-11-15 | Discharge: 2015-11-18 | DRG: 331 | Disposition: A | Discharge status: Home / Self Care | Payer: BLUE CROSS/BLUE SHIELD | Source: Ambulatory Visit | Attending: General Surgery | Admitting: General Surgery

## 2015-11-15 DIAGNOSIS — Z85048 Personal history of other malignant neoplasm of rectum, rectosigmoid junction, and anus: Secondary | ICD-10-CM

## 2015-11-15 DIAGNOSIS — Z79899 Other long term (current) drug therapy: Secondary | ICD-10-CM

## 2015-11-15 DIAGNOSIS — Z9221 Personal history of antineoplastic chemotherapy: Secondary | ICD-10-CM

## 2015-11-15 DIAGNOSIS — Z433 Encounter for attention to colostomy: Principal | ICD-10-CM

## 2015-11-15 DIAGNOSIS — K660 Peritoneal adhesions (postprocedural) (postinfection): Secondary | ICD-10-CM | POA: Diagnosis not present

## 2015-11-15 DIAGNOSIS — Z933 Colostomy status: Secondary | ICD-10-CM | POA: Diagnosis not present

## 2015-11-15 DIAGNOSIS — IMO0002 Reserved for concepts with insufficient information to code with codable children: Secondary | ICD-10-CM

## 2015-11-15 HISTORY — PX: LAPAROSCOPIC LYSIS OF ADHESIONS: SHX5905

## 2015-11-15 HISTORY — PX: COLOSTOMY REVERSAL: SHX5782

## 2015-11-15 SURGERY — LYSIS, ADHESIONS, LAPAROSCOPIC
Anesthesia: General

## 2015-11-15 MED ORDER — FENTANYL CITRATE (PF) 100 MCG/2ML IJ SOLN
INTRAMUSCULAR | Status: AC
  Filled 2015-11-15: qty 2

## 2015-11-15 MED ORDER — ONDANSETRON HCL 4 MG/2ML IJ SOLN
4.0000 mg | Freq: Four times a day (QID) | INTRAMUSCULAR | Status: DC | PRN
  Administered 2015-11-15: 4 mg via INTRAVENOUS
  Filled 2015-11-15: qty 2

## 2015-11-15 MED ORDER — LACTATED RINGERS IV SOLN
INTRAVENOUS | Status: DC | PRN
  Administered 2015-11-15 (×4): via INTRAVENOUS

## 2015-11-15 MED ORDER — ALVIMOPAN 12 MG PO CAPS
12.0000 mg | ORAL_CAPSULE | Freq: Two times a day (BID) | ORAL | Status: DC
  Administered 2015-11-16 (×2): 12 mg via ORAL
  Filled 2015-11-15 (×2): qty 1

## 2015-11-15 MED ORDER — SCOPOLAMINE 1 MG/3DAYS TD PT72
MEDICATED_PATCH | TRANSDERMAL | Status: DC | PRN
  Administered 2015-11-15: 1.5 mg via TRANSDERMAL

## 2015-11-15 MED ORDER — CLINDAMYCIN PHOSPHATE 900 MG/50ML IV SOLN: 900.0000 mg | Freq: Once | INTRAVENOUS | Status: DC

## 2015-11-15 MED ORDER — SUFENTANIL CITRATE 50 MCG/ML IV SOLN
INTRAVENOUS | Status: DC | PRN
  Administered 2015-11-15 (×7): 10 ug via INTRAVENOUS

## 2015-11-15 MED ORDER — ACETAMINOPHEN 500 MG PO TABS
ORAL_TABLET | ORAL | Status: AC
  Filled 2015-11-15: qty 2

## 2015-11-15 MED ORDER — ONDANSETRON HCL 4 MG PO TABS: 4.0000 mg | ORAL_TABLET | Freq: Four times a day (QID) | ORAL | Status: DC | PRN

## 2015-11-15 MED ORDER — SACCHAROMYCES BOULARDII 250 MG PO CAPS
250.0000 mg | ORAL_CAPSULE | Freq: Two times a day (BID) | ORAL | Status: DC
  Administered 2015-11-15 – 2015-11-17 (×5): 250 mg via ORAL
  Filled 2015-11-15 (×5): qty 1

## 2015-11-15 MED ORDER — ONDANSETRON HCL 4 MG/2ML IJ SOLN
INTRAMUSCULAR | Status: AC
  Filled 2015-11-15: qty 2

## 2015-11-15 MED ORDER — CLINDAMYCIN PHOSPHATE 900 MG/50ML IV SOLN
INTRAVENOUS | Status: AC
  Filled 2015-11-15: qty 50

## 2015-11-15 MED ORDER — DEXAMETHASONE SODIUM PHOSPHATE 10 MG/ML IJ SOLN
INTRAMUSCULAR | Status: DC | PRN
  Administered 2015-11-15: 10 mg via INTRAVENOUS

## 2015-11-15 MED ORDER — ACETAMINOPHEN 325 MG PO TABS
ORAL_TABLET | ORAL | Status: DC | PRN
  Administered 2015-11-15: 1000 mg via ORAL

## 2015-11-15 MED ORDER — ONDANSETRON HCL 4 MG/2ML IJ SOLN: 4.0000 mg | Freq: Once | INTRAMUSCULAR | Status: DC | PRN

## 2015-11-15 MED ORDER — FENTANYL CITRATE (PF) 100 MCG/2ML IJ SOLN
25.0000 ug | INTRAMUSCULAR | Status: DC | PRN
  Administered 2015-11-15 (×2): 50 ug via INTRAVENOUS

## 2015-11-15 MED ORDER — SUFENTANIL CITRATE 50 MCG/ML IV SOLN
INTRAVENOUS | Status: AC
  Filled 2015-11-15: qty 1

## 2015-11-15 MED ORDER — ROCURONIUM BROMIDE 100 MG/10ML IV SOLN
INTRAVENOUS | Status: DC | PRN
  Administered 2015-11-15: 20 mg via INTRAVENOUS
  Administered 2015-11-15: 50 mg via INTRAVENOUS
  Administered 2015-11-15: 10 mg via INTRAVENOUS

## 2015-11-15 MED ORDER — CHLORHEXIDINE GLUCONATE CLOTH 2 % EX PADS
6.0000 | MEDICATED_PAD | Freq: Once | CUTANEOUS | Status: DC
Start: 2015-11-15 — End: 2015-11-15

## 2015-11-15 MED ORDER — SODIUM CHLORIDE 0.9 % IJ SOLN
INTRAMUSCULAR | Status: AC
  Filled 2015-11-15: qty 10

## 2015-11-15 MED ORDER — ONDANSETRON HCL 4 MG/2ML IJ SOLN
INTRAMUSCULAR | Status: DC | PRN
  Administered 2015-11-15 (×2): 4 mg via INTRAVENOUS

## 2015-11-15 MED ORDER — HYDROMORPHONE HCL 1 MG/ML IJ SOLN
0.5000 mg | INTRAMUSCULAR | Status: DC | PRN
  Administered 2015-11-15: 0.5 mg via INTRAVENOUS
  Filled 2015-11-15 (×2): qty 1

## 2015-11-15 MED ORDER — SODIUM CHLORIDE 0.9 % IR SOLN
Status: DC | PRN
  Administered 2015-11-15: 2000 mL

## 2015-11-15 MED ORDER — LIDOCAINE HCL (CARDIAC) 20 MG/ML IV SOLN
INTRAVENOUS | Status: AC
  Filled 2015-11-15: qty 5

## 2015-11-15 MED ORDER — MIDAZOLAM HCL 5 MG/5ML IJ SOLN
INTRAMUSCULAR | Status: DC | PRN
  Administered 2015-11-15: 2 mg via INTRAVENOUS

## 2015-11-15 MED ORDER — SUGAMMADEX SODIUM 200 MG/2ML IV SOLN
INTRAVENOUS | Status: DC | PRN
  Administered 2015-11-15: 200 mg via INTRAVENOUS

## 2015-11-15 MED ORDER — ACETAMINOPHEN 10 MG/ML IV SOLN
1000.0000 mg | Freq: Four times a day (QID) | INTRAVENOUS | Status: AC
  Administered 2015-11-16 (×3): 1000 mg via INTRAVENOUS
  Filled 2015-11-15 (×4): qty 100

## 2015-11-15 MED ORDER — BUPIVACAINE-EPINEPHRINE (PF) 0.25% -1:200000 IJ SOLN
INTRAMUSCULAR | Status: AC
  Filled 2015-11-15: qty 30

## 2015-11-15 MED ORDER — SCOPOLAMINE 1 MG/3DAYS TD PT72
MEDICATED_PATCH | TRANSDERMAL | Status: AC
Start: 2015-11-15 — End: 2015-11-15
  Filled 2015-11-15: qty 1

## 2015-11-15 MED ORDER — BUPIVACAINE-EPINEPHRINE 0.25% -1:200000 IJ SOLN
INTRAMUSCULAR | Status: DC | PRN
  Administered 2015-11-15: 11 mL

## 2015-11-15 MED ORDER — CHLORHEXIDINE GLUCONATE CLOTH 2 % EX PADS: 6.0000 | MEDICATED_PAD | Freq: Once | CUTANEOUS | Status: DC

## 2015-11-15 MED ORDER — ALVIMOPAN 12 MG PO CAPS
12.0000 mg | ORAL_CAPSULE | Freq: Once | ORAL | Status: AC
  Administered 2015-11-15: 12 mg via ORAL
  Filled 2015-11-15: qty 1

## 2015-11-15 MED ORDER — PROPOFOL 10 MG/ML IV BOLUS
INTRAVENOUS | Status: DC | PRN
  Administered 2015-11-15: 50 mg via INTRAVENOUS
  Administered 2015-11-15: 200 mg via INTRAVENOUS

## 2015-11-15 MED ORDER — ROCURONIUM BROMIDE 100 MG/10ML IV SOLN
INTRAVENOUS | Status: AC
  Filled 2015-11-15: qty 1

## 2015-11-15 MED ORDER — DEXTROSE-NACL 5-0.9 % IV SOLN
INTRAVENOUS | Status: DC
  Administered 2015-11-15 – 2015-11-17 (×4): via INTRAVENOUS

## 2015-11-15 MED ORDER — PROPOFOL 10 MG/ML IV BOLUS
INTRAVENOUS | Status: AC
  Filled 2015-11-15: qty 40

## 2015-11-15 MED ORDER — SUCCINYLCHOLINE CHLORIDE 20 MG/ML IJ SOLN
INTRAMUSCULAR | Status: DC | PRN
Start: 2015-11-15 — End: 2015-11-15
  Administered 2015-11-15: 100 mg via INTRAVENOUS

## 2015-11-15 MED ORDER — GENTAMICIN SULFATE 40 MG/ML IJ SOLN
Freq: Once | INTRAMUSCULAR | Status: AC
  Administered 2015-11-15: 115 mL via INTRAVENOUS
  Filled 2015-11-15: qty 9.25

## 2015-11-15 MED ORDER — FENTANYL CITRATE (PF) 100 MCG/2ML IJ SOLN: 25.0000 ug | INTRAMUSCULAR | Status: DC | PRN

## 2015-11-15 MED ORDER — LACTATED RINGERS IR SOLN
Status: DC | PRN
  Administered 2015-11-15: 1000 mL

## 2015-11-15 MED ORDER — LIDOCAINE HCL (CARDIAC) 20 MG/ML IV SOLN
INTRAVENOUS | Status: DC | PRN
Start: 2015-11-15 — End: 2015-11-15
  Administered 2015-11-15: 25 mg via INTRATRACHEAL
  Administered 2015-11-15: 75 mg via INTRAVENOUS

## 2015-11-15 MED ORDER — ENOXAPARIN SODIUM 40 MG/0.4ML ~~LOC~~ SOLN
40.0000 mg | SUBCUTANEOUS | Status: DC
  Administered 2015-11-16 – 2015-11-17 (×2): 40 mg via SUBCUTANEOUS
  Filled 2015-11-15 (×2): qty 0.4

## 2015-11-15 MED ORDER — CLINDAMYCIN PHOSPHATE 900 MG/50ML IV SOLN
900.0000 mg | Freq: Three times a day (TID) | INTRAVENOUS | Status: AC
  Administered 2015-11-15: 900 mg via INTRAVENOUS
  Filled 2015-11-15: qty 50

## 2015-11-15 MED ORDER — SUGAMMADEX SODIUM 200 MG/2ML IV SOLN
INTRAVENOUS | Status: AC
Start: 2015-11-15 — End: 2015-11-15
  Filled 2015-11-15: qty 2

## 2015-11-15 MED ORDER — MIDAZOLAM HCL 2 MG/2ML IJ SOLN
INTRAMUSCULAR | Status: AC
  Filled 2015-11-15: qty 2

## 2015-11-15 MED ORDER — PROPOFOL 10 MG/ML IV BOLUS
INTRAVENOUS | Status: AC
  Filled 2015-11-15: qty 20

## 2015-11-15 MED ORDER — FLUTICASONE PROPIONATE 50 MCG/ACT NA SUSP: 2.0000 | Freq: Two times a day (BID) | NASAL | Status: DC | PRN

## 2015-11-15 MED ORDER — GENTAMICIN SULFATE 40 MG/ML IJ SOLN: 370.0000 mg | Freq: Once | INTRAVENOUS | Status: DC

## 2015-11-15 MED ORDER — DEXAMETHASONE SODIUM PHOSPHATE 10 MG/ML IJ SOLN
INTRAMUSCULAR | Status: AC
  Filled 2015-11-15: qty 1

## 2015-11-15 SURGICAL SUPPLY — 54 items
APPLIER CLIP 5 13 M/L LIGAMAX5 (MISCELLANEOUS)
APPLIER CLIP ROT 10 11.4 M/L (STAPLE)
BLADE EXTENDED COATED 6.5IN (ELECTRODE) IMPLANT
BLADE HEX COATED 2.75 (ELECTRODE) ×2 IMPLANT
CABLE HIGH FREQUENCY MONO STRZ (ELECTRODE) ×2 IMPLANT
CELLS DAT CNTRL 66122 CELL SVR (MISCELLANEOUS) IMPLANT
CLIP APPLIE 5 13 M/L LIGAMAX5 (MISCELLANEOUS) IMPLANT
CLIP APPLIE ROT 10 11.4 M/L (STAPLE) IMPLANT
COVER SURGICAL LIGHT HANDLE (MISCELLANEOUS) IMPLANT
DECANTER SPIKE VIAL GLASS SM (MISCELLANEOUS) ×2 IMPLANT
DRAPE LAPAROSCOPIC ABDOMINAL (DRAPES) ×2 IMPLANT
ELECT REM PT RETURN 9FT ADLT (ELECTROSURGICAL) ×2
ELECTRODE REM PT RTRN 9FT ADLT (ELECTROSURGICAL) ×1 IMPLANT
ENSEAL DEVICE STD TIP 35CM (ENDOMECHANICALS) IMPLANT
GAUZE SPONGE 4X4 12PLY STRL (GAUZE/BANDAGES/DRESSINGS) ×2 IMPLANT
GLOVE BIO SURGEON STRL SZ7.5 (GLOVE) ×4 IMPLANT
GLOVE BIOGEL PI IND STRL 7.0 (GLOVE) ×2 IMPLANT
GLOVE BIOGEL PI INDICATOR 7.0 (GLOVE) ×2
GOWN STRL REUS W/ TWL XL LVL3 (GOWN DISPOSABLE) ×4 IMPLANT
GOWN STRL REUS W/TWL LRG LVL3 (GOWN DISPOSABLE) ×10 IMPLANT
GOWN STRL REUS W/TWL XL LVL3 (GOWN DISPOSABLE) ×6 IMPLANT
IRRIG SUCT STRYKERFLOW 2 WTIP (MISCELLANEOUS) ×2
IRRIGATION SUCT STRKRFLW 2 WTP (MISCELLANEOUS) ×1 IMPLANT
LEGGING LITHOTOMY PAIR STRL (DRAPES) ×2 IMPLANT
LIGASURE IMPACT 36 18CM CVD LR (INSTRUMENTS) IMPLANT
LIQUID BAND (GAUZE/BANDAGES/DRESSINGS) IMPLANT
NS IRRIG 1000ML POUR BTL (IV SOLUTION) ×2 IMPLANT
PACK COLON (CUSTOM PROCEDURE TRAY) ×2 IMPLANT
RTRCTR WOUND ALEXIS 18CM MED (MISCELLANEOUS)
SCISSORS LAP 5X35 DISP (ENDOMECHANICALS) ×2 IMPLANT
SHEARS HARMONIC ACE PLUS 36CM (ENDOMECHANICALS) ×2 IMPLANT
SOLUTION ANTI FOG 6CC (MISCELLANEOUS) ×2 IMPLANT
STAPLER CIRC ILS CVD 33MM 37CM (STAPLE) ×2 IMPLANT
STAPLER VISISTAT 35W (STAPLE) ×2 IMPLANT
STRIP CLOSURE SKIN 1/2X4 (GAUZE/BANDAGES/DRESSINGS) ×2 IMPLANT
SUT ETHILON 3 0 PS 1 (SUTURE) ×6 IMPLANT
SUT NOVA NAB GS-21 0 18 T12 DT (SUTURE) ×2 IMPLANT
SUT PDS AB 1 CTX 36 (SUTURE) IMPLANT
SUT PROLENE 2 0 SH DA (SUTURE) ×2 IMPLANT
SUT SILK 2 0 (SUTURE) ×1
SUT SILK 2 0 SH CR/8 (SUTURE) ×2 IMPLANT
SUT SILK 2-0 18XBRD TIE 12 (SUTURE) ×1 IMPLANT
SUT SILK 3 0 (SUTURE) ×1
SUT SILK 3 0 SH CR/8 (SUTURE) ×2 IMPLANT
SUT SILK 3-0 18XBRD TIE 12 (SUTURE) ×1 IMPLANT
TAPE CLOTH 4X10 WHT NS (GAUZE/BANDAGES/DRESSINGS) ×2 IMPLANT
TRAY FOLEY W/METER SILVER 14FR (SET/KITS/TRAYS/PACK) ×2 IMPLANT
TRAY FOLEY W/METER SILVER 16FR (SET/KITS/TRAYS/PACK) IMPLANT
TROCAR BLADELESS OPT 5 100 (ENDOMECHANICALS) ×8 IMPLANT
TROCAR XCEL BLUNT TIP 100MML (ENDOMECHANICALS) IMPLANT
TROCAR XCEL NON-BLD 11X100MML (ENDOMECHANICALS) IMPLANT
TROCAR XCEL UNIV SLVE 11M 100M (ENDOMECHANICALS) IMPLANT
TUBING INSUF HEATED (TUBING) ×2 IMPLANT
YANKAUER SUCT BULB TIP NO VENT (SUCTIONS) ×2 IMPLANT

## 2015-11-15 NOTE — Anesthesia Preprocedure Evaluation (Signed)
Anesthesia Evaluation  Patient identified by MRN, date of birth, ID band Patient awake    Reviewed: Allergy & Precautions, NPO status , Patient's Chart, lab work & pertinent test results  History of Anesthesia Complications (+) PONV and history of anesthetic complications  Airway Mallampati: II  TM Distance: >3 FB Neck ROM: Full    Dental no notable dental hx. (+) Dental Advisory Given   Pulmonary neg pulmonary ROS,    Pulmonary exam normal breath sounds clear to auscultation       Cardiovascular negative cardio ROS Normal cardiovascular exam Rhythm:Regular Rate:Normal     Neuro/Psych negative neurological ROS  negative psych ROS   GI/Hepatic Neg liver ROS, Colon ca   Endo/Other  negative endocrine ROS  Renal/GU negative Renal ROS  negative genitourinary   Musculoskeletal negative musculoskeletal ROS (+)   Abdominal   Peds negative pediatric ROS (+)  Hematology negative hematology ROS (+)   Anesthesia Other Findings   Reproductive/Obstetrics negative OB ROS                             Anesthesia Physical Anesthesia Plan  ASA: II  Anesthesia Plan: General   Post-op Pain Management:    Induction: Intravenous  Airway Management Planned: Oral ETT  Additional Equipment:   Intra-op Plan:   Post-operative Plan: Extubation in OR  Informed Consent: I have reviewed the patients History and Physical, chart, labs and discussed the procedure including the risks, benefits and alternatives for the proposed anesthesia with the patient or authorized representative who has indicated his/her understanding and acceptance.   Dental advisory given  Plan Discussed with: CRNA  Anesthesia Plan Comments:         Anesthesia Quick Evaluation

## 2015-11-15 NOTE — Op Note (Signed)
11/15/2015  2:09 PM  PATIENT:  Carla Mills  45 y.o. female  PRE-OPERATIVE DIAGNOSIS:  hartman's colsotomy  POST-OPERATIVE DIAGNOSIS:  hartman's colsotomy, adhesions  PROCEDURE:  Procedure(s): LAPAROSCOPIC LYSIS OF ADHESIONS x 1hr (N/A) SPLENIC FLEXURE MOBILIZATION COLOSTOMY REVERSAL (N/A)  SURGEON:  Surgeon(s) and Role:    * [name], MD - Primary    * Donnie Mesa, MD - Assisting  ANESTHESIA:   local and general  EBL:  Total I/O In: 2000 [I.V.:2000] Out: 140 [Urine:40; Blood:100]  BLOOD ADMINISTERED:none  DRAINS: none   LOCAL MEDICATIONS USED:  BUPIVICAINE   SPECIMEN:  No Specimen  DISPOSITION OF SPECIMEN:  N/A  COUNTS:  YES  TOURNIQUET:  * No tourniquets in log *  DICTATION: .Dragon Dictation  Complications: none  Counts: reported as correct x 2   Findings: Pt had a 33EEA stapled anastomosis. The anastomosis was under no tension.  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 The patient was placed in supine position with bilateral SCDs in place.  The patient was prepped and draped in the usual sterile fashion. After appropriate anitbiotics were confirmed, a time-out was confirmed and all facts were verified. A pneumoperitoneum of 14 mmHg was obtained via Veress needle technique in the right subcostal margin. Subsequently this a 5 mm camera and trocar were then introduced there's no acute intra-abdominal organ injury. There seemed to be a moderate amount of adhesions to the anterior midline. At this time the right lower quadrant 5 mm trocar was in place a direct visualization. At this time proceeded to take down the omental adhesions to the anterior abdominal wall. These were in the midline and down towards the pelvis. Lysis of adhesions took approximately 60 minutes. A 5 mm trochar was placed in the midline under direct  visualization. A fourth working trocar was then placed in the left lower quadrant under direct visualization. At this time I was able to take down the peritoneal adhesions from the ostomy.   At this time I turned my attention to the splenic flexure. I proceeded to incise the left lateral white line of Toldt. I proceeded proximally. Secondary to the proximity of the spleen proceeded at this time from a medial to lateral direction of the transverse colon. I was able to free up the retroperitoneal attachments to the transverse colon. The splenic flexure was redundant. At this time we turned our attention to the pelvis.  The patient was placed in Trendelenburg position. The small bowel was moved out of the pelvis. The Prolene stitches were then visualized at the rectal stump. The retro-stump was reperitonealized, a dilator was used to clearly identify the rectal stump.  At this time I proceeded to intra-abdominally take down the adhesions of the colostomy site. These were easily second x-ray taken down. We proceeded to take down the ostomy from the skin level. An elliptical incision was made around the colostomy. The peritoneum was entered which assisted in dissecting away the ostomy from the surrounding tissue. At this time the distal portion of the colostomy was amputated.  This allowed healthy, bleeding tissue at the edge.  At this time EEA sizers were then placed into the colon. A 33 EEA sizer easily fit. At this timE a 33 EEA anvil was then placed into the colon. A 2-0 Prolene was used to create a pursestring suture and sutured down. At this  time the ampulla was then placed back into the abdominal cavity. The anterior fascia and peritoneum were reapproximated using interrupted 0 Novafil sutures. The insufflation was restarted.  The EEA stapler was advanced from the anus and rectum. The spike was brought out. This was connected to the anvil. This was fired appropriately. The donuts were checked after the EEA  was withdrawn. The donuts were circumferentially intact. At this time the pelvis was filled with saline. A rigid sig was used to insufflate air into the rectum as it was being occluded. There was a negative bubble test. At this time the irrigation was suctioned out. We will to visualize the entire abdominal cavity. There was minimal oozing. At this time per protocol the dirty to clean changeover was done.  At this time Betadine soaked 4 x 4's were then placed in the ostomy site. 3-0 nylonS 3 were used to vertical mattress stitch. These were left in place as were the 4 x 4's. These were secured to the abdominal skin using Steri-Strips. At this time the skin of all trocar sites were reapproximated using 4-0 Monocryl subcutaneous to the fascia. This catheter Steri-Strips gauze and tape. The ostomy was dressed with 4 x 4's and tape. The patient tied the procedure well was taken to the recovery stable condition.    PLAN OF CARE: Admit to inpatient  PATIENT DISPOSITION:  PACU - hemodynamically stable.   Delay start of Pharmacological VTE agent (>24hrs) due to surgical blood loss or risk of bleeding: Yes

## 2015-11-15 NOTE — Anesthesia Postprocedure Evaluation (Signed)
Anesthesia Post Note  Patient: Carla Mills  Procedure(s) Performed: Procedure(s) (LRB): LAPAROSCOPIC LYSIS OF ADHESIONS (N/A) COLOSTOMY REVERSAL (N/A)  Patient location during evaluation: PACU Anesthesia Type: General Level of consciousness: awake and alert Pain management: pain level controlled Vital Signs Assessment: post-procedure vital signs reviewed and stable Respiratory status: spontaneous breathing, nonlabored ventilation, respiratory function stable and patient connected to nasal cannula oxygen Cardiovascular status: blood pressure returned to baseline and stable Postop Assessment: no signs of nausea or vomiting Anesthetic complications: no    Last Vitals:  Vitals:   11/15/15 1455 11/15/15 1500  BP:  122/80  Pulse: 62 73  Resp: 12 10  Temp:      Last Pain:  Vitals:   11/15/15 1455  TempSrc:   PainSc: 3                  Sherrin Stahle,W. EDMOND

## 2015-11-15 NOTE — Anesthesia Procedure Notes (Signed)
Procedure Name: Intubation Date/Time: 11/15/2015 11:44 AM Performed by: Lissa Morales Pre-anesthesia Checklist: Patient identified, Emergency Drugs available, Suction available and Patient being monitored Patient Re-evaluated:Patient Re-evaluated prior to inductionOxygen Delivery Method: Circle system utilized Preoxygenation: Pre-oxygenation with 100% oxygen Intubation Type: IV induction Ventilation: Mask ventilation without difficulty Grade View: Grade II Tube type: Oral Tube size: 7.5 mm Number of attempts: 1 Airway Equipment and Method: Stylet and Oral airway Placement Confirmation: ETT inserted through vocal cords under direct vision,  positive ETCO2 and breath sounds checked- equal and bilateral Secured at: 22 cm Tube secured with: Tape Dental Injury: Teeth and Oropharynx as per pre-operative assessment

## 2015-11-15 NOTE — H&P (Signed)
Leanndra Dalo is an 45 y.o. female.   Chief Complaint: colostomy HPI:  The patient is a 45 year old female who presents with a complaint of Hartman's colostomy. 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 Patient's been doing well with her chemotherapy. She has one round of Xeloda left which will be done in 2 weeks.. Patient sees Dr. Marin Olp. Patient undergo CT scan July 18.  Patient's had no issues with her colostomy.   Past Medical History:  Diagnosis Date  . Colonic cancer ([place]) 03/09/2015  . History of chemotherapy    completed on 09/12/2015   . PONV (postoperative nausea and vomiting)   . Rectal mass 02/20/15   Rectal/sigmoid mass    Past Surgical History:  Procedure Laterality Date  . APPENDECTOMY  02/20/15   Abnormal appearing  . CESAREAN SECTION     x 2  . COLONOSCOPY N/A 10/09/2015   Procedure: COLONOSCOPY;  Surgeon: Leighton Ruff, MD;  Location: [facility] ENDOSCOPY;  Service: Endoscopy;  Laterality: N/A;  . COLOSTOMY  02/20/15   Sigmoid/rectal mass  . COLOSTOMY N/A 02/20/2015   Procedure: COLOSTOMY;  Surgeon: [name], MD;  Location: [place];  Service: General;  Laterality: N/A;  . EXPLORATORY LAPAROTOMY  02/20/15   Sigmoid/rectal mass  . FLEXIBLE SIGMOIDOSCOPY N/A 02/15/2015   Procedure: FLEXIBLE SIGMOIDOSCOPY;  Surgeon: [name], MD;  Location: [facility] ENDOSCOPY;  Service: Endoscopy;  Laterality: N/A;  . LAPAROSCOPY N/A 02/20/2015   Procedure: LAPAROSCOPY DIAGNOSTIC;  Surgeon: [name], MD;  Location: [place];  Service: General;  Laterality: N/A;  . LAPAROSCOPY ABDOMEN DIAGNOSTIC  02/20/15   Converted to open - Sigmoid/rectal mass  . LAPAROTOMY N/A 02/20/2015   Procedure: EXPLORATORY LAPAROTOMY AND PARTIAL COLECTOMY;  Surgeon: [name], MD;  Location: [place];  Service: General;  Laterality: N/A;  . LOW ANTERIOR BOWEL RESECTION  02/20/15   Sigmoid/rectal mass  . OSTEOTOMY      reconstructed coccyx   . reconstructed nerve in ankle     . TONSILLECTOMY      No family history on file. Social History:  reports that she has never smoked. She has never used smokeless tobacco. She reports that she drinks alcohol. She reports that she does not use drugs.  Allergies:  Allergies  Allergen Reactions  . Bactrim [Sulfamethoxazole-Trimethoprim]   . Oxaprozin Hives    REACTION: Hives  . Penicillins Hives    REACTION: Hives Has patient had a PCN reaction causing immediate rash, facial/tongue/throat swelling, SOB or lightheadedness with hypotension: no Has patient had a PCN reaction causing severe rash involving mucus membranes or skin necrosis: {no Has patient had a PCN reaction that required hospitalization no Has patient had a PCN reaction occurring within the last 10 years: no If all of the above answers are "NO", then may proceed with Cephalosporin use.  . Sulfonamide Derivatives Swelling    Massive extremity swelling     Medications Prior to Admission  Medication Sig Dispense Refill  . fexofenadine (ALLEGRA) 180 MG tablet Take 180 mg by mouth daily as needed for allergies.     Marland Kitchen ibuprofen (ADVIL,MOTRIN) 200 MG tablet Take 400 mg by mouth every 6 (six) hours as needed for headache or mild pain.    . Pseudoephedrine HCl (PSEUDO PO) Take 2 tablets by mouth daily as needed (congestion).    . capecitabine (XELODA) 500 MG tablet Take 4 pills twice a day with food for 14 days, then  off for 7 days. (Patient not taking: Reported on 10/31/2015) 112 tablet 8    No results found for this or any previous visit (from the past 48 hour(s)). No results found.  Review of Systems  Constitutional: Negative for chills, fever, malaise/fatigue and weight loss.  HENT: Negative for ear pain, hearing loss and tinnitus.   Eyes: Negative for blurred vision, double vision and photophobia.  Respiratory: Negative for cough and sputum production.   Cardiovascular: Negative for chest pain,  palpitations, orthopnea, claudication and leg swelling.  Gastrointestinal: Negative for abdominal pain, blood in stool, constipation, diarrhea, heartburn, melena, nausea and vomiting.  Genitourinary: Negative for dysuria, frequency, hematuria and urgency.  Musculoskeletal: Negative for back pain, myalgias and neck pain.  Skin: Negative for itching.  Neurological: Negative for dizziness, tingling, tremors, sensory change and headaches.  Endo/Heme/Allergies: Negative for environmental allergies and polydipsia. Does not bruise/bleed easily.  Psychiatric/Behavioral: Negative for depression, substance abuse and suicidal ideas.  All other systems reviewed and are negative.   Blood pressure (!) 138/93, pulse 89, temperature 98.8 F (37.1 C), temperature source Oral, resp. rate 16, height 5\' 5"  (1.651 m), weight 73 kg (161 lb), SpO2 99 %. Physical Exam  Constitutional: She is oriented to person, place, and time. She appears well-developed and well-nourished.  HENT:  Head: Normocephalic and atraumatic.  Right Ear: External ear normal.  Eyes: Conjunctivae and EOM are normal. Pupils are equal, round, and reactive to light. Right eye exhibits no discharge. Left eye exhibits no discharge.  Neck: Normal range of motion. Neck supple. No JVD present. No tracheal deviation present. No thyromegaly present.  Cardiovascular: Normal rate and regular rhythm.  Exam reveals no gallop and no friction rub.   No murmur heard. Respiratory: Effort normal and breath sounds normal. No stridor. No respiratory distress. She has no wheezes. She has no rales. She exhibits no tenderness.  GI: Soft. Bowel sounds are normal. She exhibits no distension and no mass. There is no tenderness. There is no rebound and no guarding.  Colostomy patent   Musculoskeletal: Normal range of motion.  Neurological: She is alert and oriented to person, place, and time.  Skin: Skin is warm and dry.     Assessment/Plan 45 y/o F s/p LAR and  end colostomy for rectal cancer. 1. We will proceed with Hartman's reversal 2. I d/w the patient the possible risks and benefits to include but not limited to: infection, bleeding, damage to surround structures, possible leak of anastamosis, possible need for further surgery.  The patient voiced understanding and wishes to proceed.   Reyes Ivan, MD 11/15/2015, 10:28 AM

## 2015-11-15 NOTE — Transfer of Care (Signed)
Immediate Anesthesia Transfer of Care Note  Patient: Carla Mills  Procedure(s) Performed: Procedure(s): LAPAROSCOPIC LYSIS OF ADHESIONS (N/A) COLOSTOMY REVERSAL (N/A)  Patient Location: PACU  Anesthesia Type:General  Level of Consciousness: awake, alert , oriented and patient cooperative  Airway & Oxygen Therapy: Patient Spontanous Breathing and Patient connected to face mask oxygen  Post-op Assessment: Report given to RN, Post -op Vital signs reviewed and stable and Patient moving all extremities X 4  Post vital signs: stable  Last Vitals:  Vitals:   11/15/15 1018 11/15/15 1426  BP: (!) 138/93 (P) 125/75  Pulse:  (!) 101  Resp:  15  Temp:  (P) 36.7 C    Last Pain:  Vitals:   11/15/15 1015  TempSrc: Oral         Complications: No apparent anesthesia complications

## 2015-11-16 LAB — BASIC METABOLIC PANEL
ANION GAP: 5 (ref 5–15)
BUN: 7 mg/dL (ref 6–20)
CALCIUM: 8.3 mg/dL — AB (ref 8.9–10.3)
CO2: 25 mmol/L (ref 22–32)
Chloride: 110 mmol/L (ref 101–111)
Creatinine, Ser: 0.42 mg/dL — ABNORMAL LOW (ref 0.44–1.00)
GFR calc Af Amer: >60 mL/min (ref >60)
Glucose, Bld: 116 mg/dL — ABNORMAL HIGH (ref 65–99)
POTASSIUM: 3.9 mmol/L (ref 3.5–5.1)
SODIUM: 140 mmol/L (ref 135–145)

## 2015-11-16 LAB — CBC
HEMATOCRIT: 38.1 % (ref 36.0–46.0)
Hemoglobin: 13.2 g/dL (ref 12.0–15.0)
MCH: 31.9 pg (ref 26.0–34.0)
MCHC: 34.6 g/dL (ref 30.0–36.0)
MCV: 92 fL (ref 78.0–100.0)
Platelets: 289 10*3/uL (ref 150–400)
RBC: 4.14 MIL/uL (ref 3.87–5.11)
RDW: 12 % (ref 11.5–15.5)
WBC: 11.8 10*3/uL — AB (ref 4.0–10.5)

## 2015-11-16 NOTE — Progress Notes (Signed)
1 Day Post-Op  Subjective: Pt with no c/o. Ambulating overnight Pain controlled  Objective: Vital signs in last 24 hours: Temp:  [97.9 F (36.6 C)-98.8 F (37.1 C)] 98.6 F (37 C) (08/24 0545) Pulse Rate:  [58-101] 63 (08/24 0545) Resp:  [10-18] 16 (08/24 0545) BP: (88-139)/(50-104) 98/58 (08/24 0337) SpO2:  [96 %-100 %] 100 % (08/24 0545) Weight:  [73 kg (161 lb)] 73 kg (161 lb) (08/23 1015)    Intake/Output from previous day: 08/23 0701 - 08/24 0700 In: 5563.3 [P.O.:50; I.V.:4313.3; IV Piggyback:100] Out: 1040 [Urine:815; Blood:225] Intake/Output this shift: No intake/output data recorded.  General appearance: alert and cooperative GI: soft, non-tender; bowel sounds normal; no masses,  no organomegaly  Lab Results:   Recent Labs  11/16/15 0447  WBC 11.8*  HGB 13.2  HCT 38.1  PLT 289   BMET  Recent Labs  11/16/15 0447  NA 140  K 3.9  CL 110  CO2 25  GLUCOSE 116*  BUN 7  CREATININE 0.42*  CALCIUM 8.3*   PT/INR No results for input(s): LABPROT, INR in the last 72 hours. ABG No results for input(s): PHART, HCO3 in the last 72 hours.  Invalid input(s): PCO2, PO2  Studies/Results: No results found.  Anti-infectives: Anti-infectives    Start     Dose/Rate Route Frequency Ordered Stop   11/15/15 2000  clindamycin (CLEOCIN) IVPB 900 mg     900 mg 100 mL/hr over 30 Minutes Intravenous Every 8 hours 11/15/15 1534 11/15/15 2031   11/15/15 1130  gentamicin (GARAMYCIN) 370 mg, clindamycin (CLEOCIN) 900 mg in dextrose 5 % 100 mL IVPB     230.5 mL/hr over 30 Minutes Intravenous  Once 11/15/15 1059 11/15/15 1139   11/15/15 1100  clindamycin (CLEOCIN) IVPB 900 mg  Status:  Discontinued     900 mg 100 mL/hr over 30 Minutes Intravenous  Once 11/15/15 1055 11/15/15 1059   11/15/15 1100  gentamicin (GARAMYCIN) 370 mg in dextrose 5 % 50 mL IVPB  Status:  Discontinued     370 mg 118.5 mL/hr over 30 Minutes Intravenous  Once 11/15/15 1055 11/15/15 1059       Assessment/Plan: s/p Procedure(s): LAPAROSCOPIC LYSIS OF ADHESIONS (N/A) COLOSTOMY REVERSAL (N/A) DC foley Mobilize as tol con't CLD for today   LOS: 1 day    Rosario Jacks., Anne Hahn 11/16/2015

## 2015-11-17 MED ORDER — ACETAMINOPHEN 500 MG PO TABS
500.0000 mg | ORAL_TABLET | Freq: Four times a day (QID) | ORAL | Status: DC | PRN
  Administered 2015-11-17: 500 mg via ORAL
  Filled 2015-11-17: qty 1

## 2015-11-17 MED ORDER — OXYCODONE-ACETAMINOPHEN 5-325 MG PO TABS
1.0000 | ORAL_TABLET | ORAL | 0 refills | Status: DC | PRN
Start: 2015-11-17 — End: 2016-02-20

## 2015-11-17 NOTE — Progress Notes (Signed)
2 Days Post-Op  Subjective: Doing well. Had a BM with some blood today. Min pain Ambulating well  Objective: Vital signs in last 24 hours: Temp:  [97.8 F (36.6 C)-98.8 F (37.1 C)] 97.8 F (36.6 C) (08/25 1447) Pulse Rate:  [63-87] 63 (08/25 1447) Resp:  [14-16] 14 (08/25 1447) BP: (104-111)/(56-63) 111/56 (08/25 1447) SpO2:  [97 %-100 %] 100 % (08/25 1447) Last BM Date: 11/16/15  Intake/Output from previous day: 08/24 0701 - 08/25 0700 In: 2120 [P.O.:720; I.V.:1400] Out: 2000 [Urine:2000] Intake/Output this shift: Total I/O In: 1360 [P.O.:960; I.V.:400] Out: 2300 [Urine:2300]  General appearance: alert and cooperative Cardio: regular rate and rhythm, S1, S2 normal, no murmur, click, rub or gallop GI: soft, nttp, nd, incision c/d/i  Lab Results:   Recent Labs  11/16/15 0447  WBC 11.8*  HGB 13.2  HCT 38.1  PLT 289   BMET  Recent Labs  11/16/15 0447  NA 140  K 3.9  CL 110  CO2 25  GLUCOSE 116*  BUN 7  CREATININE 0.42*  CALCIUM 8.3*   PT/INR No results for input(s): LABPROT, INR in the last 72 hours. ABG No results for input(s): PHART, HCO3 in the last 72 hours.  Invalid input(s): PCO2, PO2  Studies/Results: No results found.  Anti-infectives: Anti-infectives    Start     Dose/Rate Route Frequency Ordered Stop   11/15/15 2000  clindamycin (CLEOCIN) IVPB 900 mg     900 mg 100 mL/hr over 30 Minutes Intravenous Every 8 hours 11/15/15 1534 11/15/15 2031   11/15/15 1130  gentamicin (GARAMYCIN) 370 mg, clindamycin (CLEOCIN) 900 mg in dextrose 5 % 100 mL IVPB     230.5 mL/hr over 30 Minutes Intravenous  Once 11/15/15 1059 11/15/15 1139   11/15/15 1100  clindamycin (CLEOCIN) IVPB 900 mg  Status:  Discontinued     900 mg 100 mL/hr over 30 Minutes Intravenous  Once 11/15/15 1055 11/15/15 1059   11/15/15 1100  gentamicin (GARAMYCIN) 370 mg in dextrose 5 % 50 mL IVPB  Status:  Discontinued     370 mg 118.5 mL/hr over 30 Minutes Intravenous  Once  11/15/15 1055 11/15/15 1059      Assessment/Plan: s/p Procedure(s): LAPAROSCOPIC LYSIS OF ADHESIONS (N/A) COLOSTOMY REVERSAL (N/A) Advance diet to soft, then as tol [place] in AM  LOS: 2 days    Carla Mills., Anne Hahn 11/17/2015

## 2015-11-18 NOTE — Discharge Summary (Signed)
  Patient ID: Carla Mills [id number] y.o. 10-03-1970  11/15/2015  Discharge date and time: No discharge date for patient encounter.  Admitting Physician: Dr Rosendo Gros  Discharge Physician: Rosario Adie  Admission Diagnoses: hartmans colostomy  Discharge Diagnoses: same   Operations: Procedure(s): LAPAROSCOPIC LYSIS OF ADHESIONS COLOSTOMY REVERSAL    Discharged Condition: good    Hospital Course: Patient's diet was advanced as tolerated.  By POD 4 she was tolerating a diet and having bowel function.  Her pain was controlled with PO meds.   Consults: None  Significant Diagnostic Studies: labs: cbc, chemistry  Treatments: IV hydration, analgesia: acetaminophen w/ codeine and surgery: colostomy reversal  Disposition: Home

## 2015-11-18 NOTE — Discharge Instructions (Signed)
SURGERY: POST OP INSTRUCTIONS °(Surgery for small bowel obstruction, colon resection, etc) ° ° °###################################################################### ° °EAT °Gradually transition to a high fiber diet with a fiber supplement over the next few days after discharge ° °WALK °Walk an hour a day.  Control your pain to do that.   ° °CONTROL PAIN °Control pain so that you can walk, sleep, tolerate sneezing/coughing, go up/down stairs. ° °HAVE A BOWEL MOVEMENT DAILY °Keep your bowels regular to avoid problems.  OK to try a laxative to override constipation.  OK to use an antidairrheal to slow down diarrhea.  Call if not better after 2 tries ° °CALL IF YOU HAVE PROBLEMS/CONCERNS °Call if you are still struggling despite following these instructions. °Call if you have concerns not answered by these instructions ° °###################################################################### ° ° °DIET °Follow a light diet the first few days at home.  Start with a bland diet such as soups, liquids, starchy foods, low fat foods, etc.  If you feel full, bloated, or constipated, stay on a ful liquid or pureed/blenderized diet for a few days until you feel better and no longer constipated. °Be sure to drink plenty of fluids every day to avoid getting dehydrated (feeling dizzy, not urinating, etc.). °Gradually add a fiber supplement to your diet over the next week.  Gradually get back to a regular solid diet.  Avoid fast food or heavy meals the first week as you are more likely to get nauseated. °It is expected for your digestive tract to need a few months to get back to normal.  It is common for your bowel movements and stools to be irregular.  You will have occasional bloating and cramping that should eventually fade away.  Until you are eating solid food normally, off all pain medications, and back to regular activities; your bowels will not be normal. °Focus on eating a low-fat, high fiber diet the rest of your life  (See Getting to Good Bowel Health, below). ° °CARE of your INCISION or WOUND °It is good for closed incision and even open wounds to be washed every day.  Shower every day.  Short baths are fine.  Wash the incisions and wounds clean with soap & water.    °If you have a closed incision(s), wash the incision with soap & water every day.  You may leave closed incisions open to air if it is dry.   You may cover the incision with clean gauze & replace it after your daily shower for comfort. °If you have skin tapes (Steristrips) or skin glue (Dermabond) on your incision, leave them in place.  They will fall off on their own like a scab.  You may trim any edges that curl up with clean scissors.  If you have staples, set up an appointment for them to be removed in the office in 10 days after surgery.  °If you have a drain, wash around the skin exit site with soap & water and place a new dressing of gauze or band aid around the skin every day.  Keep the drain site clean & dry.    °If you have an open wound with packing, see wound care instructions.  In general, it is encouraged that you remove your dressing and packing, shower with soap & water, and replace your dressing once a day.  Pack the wound with clean gauze moistened with normal (0.9%) saline to keep the wound moist & uninfected.  Pressure on the dressing for 30 minutes will stop most wound   bleeding.  Eventually your body will heal & pull the open wound closed over the next few months.  °Raw open wounds will occasionally bleed or secrete yellow drainage until it heals closed.  Drain sites will drain a little until the drain is removed.  Even closed incisions can have mild bleeding or drainage the first few days until the skin edges scab over & seal.   °If you have an open wound with a wound vac, see wound vac care instructions. ° ° ° ° °ACTIVITIES as tolerated °Start light daily activities --- self-care, walking, climbing stairs-- beginning the day after surgery.   Gradually increase activities as tolerated.  Control your pain to be active.  Stop when you are tired.  Ideally, walk several times a day, eventually an hour a day.   °Most people are back to most day-to-day activities in a few weeks.  It takes 4-8 weeks to get back to unrestricted, intense activity. °If you can walk 30 minutes without difficulty, it is safe to try more intense activity such as jogging, treadmill, bicycling, low-impact aerobics, swimming, etc. °Save the most intensive and strenuous activity for last (Usually 4-8 weeks after surgery) such as sit-ups, heavy lifting, contact sports, etc.  Refrain from any intense heavy lifting or straining until you are off narcotics for pain control.  You will have off days, but things should improve week-by-week. °DO NOT PUSH THROUGH PAIN.  Let pain be your guide: If it hurts to do something, don't do it.  Pain is your body warning you to avoid that activity for another week until the pain goes down. °You may drive when you are no longer taking narcotic prescription pain medication, you can comfortably wear a seatbelt, and you can safely make sudden turns/stops to protect yourself without hesitating due to pain. °You may have sexual intercourse when it is comfortable. If it hurts to do something, stop. ° °MEDICATIONS °Take your usually prescribed home medications unless otherwise directed.   °Blood thinners:  °Usually you can restart any strong blood thinners after the second postoperative day.  It is OK to take aspirin right away.    ° If you are on strong blood thinners (warfarin/Coumadin, Plavix, Xerelto, Eliquis, Pradaxa, etc), discuss with your surgeon, medicine PCP, and/or cardiologist for instructions on when to restart the blood thinner & if blood monitoring is needed (PT/INR blood check, etc).   ° ° °PAIN CONTROL °Pain after surgery or related to activity is often due to strain/injury to muscle, tendon, nerves and/or incisions.  This pain is usually  short-term and will improve in a few months.  °To help speed the process of healing and to get back to regular activity more quickly, DO THE FOLLOWING THINGS TOGETHER: °1. Increase activity gradually.  DO NOT PUSH THROUGH PAIN °2. Use Ice and/or Heat °3. Try Gentle Massage and/or Stretching °4. Take over the counter pain medication °5. Take Narcotic prescription pain medication for more severe pain ° °Good pain control = faster recovery.  It is better to take more medicine to be more active than to stay in bed all day to avoid medications. °1.  Increase activity gradually °Avoid heavy lifting at first, then increase to lifting as tolerated over the next 6 weeks. °Do not “push through” the pain.  Listen to your body and avoid positions and maneuvers than reproduce the pain.  Wait a few days before trying something more intense °Walking an hour a day is encouraged to help your body recover faster   and more safely.  Start slowly and stop when getting sore.  If you can walk 30 minutes without stopping or pain, you can try more intense activity (running, jogging, aerobics, cycling, swimming, treadmill, sex, sports, weightlifting, etc.) °Remember: If it hurts to do it, then don’t do it! °2. Use Ice and/or Heat °You will have swelling and bruising around the incisions.  This will take several weeks to resolve. °Ice packs or heating pads (6-8 times a day, 30-60 minutes at a time) will help sooth soreness & bruising. °Some people prefer to use ice alone, heat alone, or alternate between ice & heat.  Experiment and see what works best for you.  Consider trying ice for the first few days to help decrease swelling and bruising; then, switch to heat to help relax sore spots and speed recovery. °Shower every day.  Short baths are fine.  It feels good!  Keep the incisions and wounds clean with soap & water.   °3. Try Gentle Massage and/or Stretching °Massage at the area of pain many times a day °Stop if you feel pain - do not  overdo it °4. Take over the counter pain medication °This helps the muscle and nerve tissues become less irritable and calm down faster °Choose ONE of the following over-the-counter anti-inflammatory medications: °Acetaminophen 500mg tabs (Tylenol) 1-2 pills with every meal and just before bedtime (avoid if you have liver problems or if you have acetaminophen in you narcotic prescription) °Naproxen 220mg tabs (ex. Aleve, Naprosyn) 1-2 pills twice a day (avoid if you have kidney, stomach, IBD, or bleeding problems) °Ibuprofen 200mg tabs (ex. Advil, Motrin) 3-4 pills with every meal and just before bedtime (avoid if you have kidney, stomach, IBD, or bleeding problems) °Take with food/snack several times a day as directed for at least 2 weeks to help keep pain / soreness down & more manageable. °5. Take Narcotic prescription pain medication for more severe pain °A prescription for strong pain control is often given to you upon discharge (for example: oxycodone/Percocet, hydrocodone/Norco/Vicodin, or tramadol/Ultram) °Take your pain medication as prescribed. °Be mindful that most narcotic prescriptions contain Tylenol (acetaminophen) as well - avoid taking too much Tylenol. °If you are having problems/concerns with the prescription medicine (does not control pain, nausea, vomiting, rash, itching, etc.), please call us [phone] to see if we need to switch you to a different pain medicine that will work better for you and/or control your side effects better. °If you need a refill on your pain medication, you must call the office before 4 pm and on weekdays only.  By federal law, prescriptions for narcotics cannot be called into a pharmacy.  They must be filled out on paper & picked up from our office by the patient or authorized caretaker.  Prescriptions cannot be filled after 4 pm nor on weekends.   ° °WHEN TO CALL US [phone] °Severe uncontrolled or worsening pain  °Fever over 101 F (38.5 C) °Concerns with  the incision: Worsening pain, redness, rash/hives, swelling, bleeding, or drainage °Reactions / problems with new medications (itching, rash, hives, nausea, etc.) °Nausea and/or vomiting °Difficulty urinating °Difficulty breathing °Worsening fatigue, dizziness, lightheadedness, blurred vision °Other concerns °If you are not getting better after two weeks or are noticing you are getting worse, contact our office [phone] for further advice.  We may need to adjust your medications, re-evaluate you in the office, send you to the emergency room, or see what other things we can do to help. °The   clinic staff is available to answer your questions during regular business hours (8:30am-5pm).  Please don’t hesitate to call and ask to speak to one of our nurses for clinical concerns.    °A surgeon from [facility] is always on call at the hospitals 24 hours/day °If you have a medical emergency, go to the nearest emergency room or call 911. ° °FOLLOW UP in our office °One the day of your discharge from the hospital (or the next business weekday), please call [facility] to set up or confirm an appointment to see your surgeon in the office for a follow-up appointment.  Usually it is 2-3 weeks after your surgery.   °If you have skin staples at your incision(s), let the office know so we can set up a time in the office for the nurse to remove them (usually around 10 days after surgery). °Make sure that you call for appointments the day of discharge (or the next business weekday) from the hospital to ensure a convenient appointment time. °IF YOU HAVE DISABILITY OR FAMILY LEAVE FORMS, BRING THEM TO THE OFFICE FOR PROCESSING.  DO NOT GIVE THEM TO YOUR DOCTOR. ° °[facility] °[address], [address], [place], [place]  [address] ? °[phone] - Main °[phone] - Toll Free,  [phone] - Fax °[email or web address] ° °GETTING TO GOOD BOWEL HEALTH. °It is  expected for your digestive tract to need a few months to get back to normal.  It is common for your bowel movements and stools to be irregular.  You will have occasional bloating and cramping that should eventually fade away.  Until you are eating solid food normally, off all pain medications, and back to regular activities; your bowels will not be normal.   °Avoiding constipation °The goal: ONE SOFT BOWEL MOVEMENT A DAY!    °Drink plenty of fluids.  Choose water first. °TAKE A FIBER SUPPLEMENT EVERY DAY THE REST OF YOUR LIFE °During your first week back home, gradually add back a fiber supplement every day °Experiment which form you can tolerate.   There are many forms such as powders, tablets, wafers, gummies, etc °Psyllium bran (Metamucil), methylcellulose (Citrucel), Miralax or Glycolax, Benefiber, Flax Seed.  °Adjust the dose week-by-week (1/2 dose/day to 6 doses a day) until you are moving your bowels 1-2 times a day.  Cut back the dose or try a different fiber product if it is giving you problems such as diarrhea or bloating. °Sometimes a laxative is needed to help jump-start bowels if constipated until the fiber supplement can help regulate your bowels.  If you are tolerating eating & you are farting, it is okay to try a gentle laxative such as double dose MiraLax, prune juice, or Milk of Magnesia.  Avoid using laxatives too often. °Stool softeners can sometimes help counteract the constipating effects of narcotic pain medicines.  It can also cause diarrhea, so avoid using for too long. °If you are still constipated despite taking fiber daily, eating solids, and a few doses of laxatives, call our office. °Controlling diarrhea °Try drinking liquids and eating bland foods for a few days to avoid stressing your intestines further. °Avoid dairy products (especially milk & ice cream) for a short time.  The intestines often can lose the ability to digest lactose when stressed. °Avoid foods that cause gassiness or  bloating.  Typical foods include beans and other legumes, cabbage, broccoli, and dairy foods.  Avoid greasy, spicy, fast foods.  Every person has   some sensitivity to other foods, so listen to your body and avoid those foods that trigger problems for you. °Probiotics (such as active yogurt, Align, etc) may help repopulate the intestines and colon with normal bacteria and calm down a sensitive digestive tract °Adding a fiber supplement gradually can help thicken stools by absorbing excess fluid and retrain the intestines to act more normally.  Slowly increase the dose over a few weeks.  Too much fiber too soon can backfire and cause cramping & bloating. °It is okay to try and slow down diarrhea with a few doses of antidiarrheal medicines.   °Bismuth subsalicylate (ex. Kayopectate, Pepto Bismol) for a few doses can help control diarrhea.  Avoid if pregnant.   °Loperamide (Imodium) can slow down diarrhea.  Start with one tablet (2mg) first.  Avoid if you are having fevers or severe pain.  °ILEOSTOMY PATIENTS WILL HAVE CHRONIC DIARRHEA since their colon is not in use.    °Drink plenty of liquids.  You will need to drink even more glasses of water/liquid a day to avoid getting dehydrated. °Record output from your ileostomy.  Expect to empty the bag every 3-4 hours at first.  Most people with a permanent ileostomy empty their bag 4-6 times at the least.   °Use antidiarrheal medicine (especially Imodium) several times a day to avoid getting dehydrated.  Start with a dose at bedtime & breakfast.  Adjust up or down as needed.  Increase antidiarrheal medications as directed to avoid emptying the bag more than 8 times a day (every 3 hours). °Work with your wound ostomy nurse to learn care for your ostomy.  See ostomy care instructions. °TROUBLESHOOTING IRREGULAR BOWELS °1) Start with a soft & bland diet. No spicy, greasy, or fried foods.  °2) Avoid gluten/wheat or dairy products from diet to see if symptoms improve. °3) Miralax  17gm or flax seed mixed in 8oz. water or juice-daily. May use 2-4 times a day as needed. °4) Gas-X, Phazyme, etc. as needed for gas & bloating.  °5) Prilosec (omeprazole) over-the-counter as needed °6)  Consider probiotics (Align, Activa, etc) to help calm the bowels down ° °Call your doctor if you are getting worse or not getting better.  Sometimes further testing (cultures, endoscopy, X-ray studies, CT scans, bloodwork, etc.) may be needed to help diagnose and treat the cause of the diarrhea. °[facility] °[address], [address], [place], [place]  [address] °[phone] - Main.    °[phone]  - Toll Free.   [phone] - Fax °[email or web address] ° ° °

## 2016-01-24 ENCOUNTER — Other Ambulatory Visit: Payer: Self-pay | Admitting: General Surgery

## 2016-01-24 ENCOUNTER — Other Ambulatory Visit (HOSPITAL_COMMUNITY): Payer: Self-pay | Admitting: General Surgery

## 2016-01-24 ENCOUNTER — Ambulatory Visit (HOSPITAL_COMMUNITY)
Admission: RE | Admit: 2016-01-24 | Discharge: 2016-01-24 | Disposition: A | Discharge status: Home / Self Care | Payer: BLUE CROSS/BLUE SHIELD | Source: Ambulatory Visit | Attending: General Surgery | Admitting: General Surgery

## 2016-01-24 ENCOUNTER — Encounter (HOSPITAL_COMMUNITY): Payer: Self-pay

## 2016-01-24 ENCOUNTER — Ambulatory Visit (HOSPITAL_COMMUNITY): Payer: BLUE CROSS/BLUE SHIELD

## 2016-01-24 DIAGNOSIS — R1110 Vomiting, unspecified: Secondary | ICD-10-CM | POA: Diagnosis not present

## 2016-01-24 DIAGNOSIS — Z933 Colostomy status: Secondary | ICD-10-CM

## 2016-01-24 DIAGNOSIS — K5900 Constipation, unspecified: Secondary | ICD-10-CM | POA: Diagnosis not present

## 2016-01-24 DIAGNOSIS — R110 Nausea: Secondary | ICD-10-CM | POA: Insufficient documentation

## 2016-02-20 ENCOUNTER — Other Ambulatory Visit (HOSPITAL_BASED_OUTPATIENT_CLINIC_OR_DEPARTMENT_OTHER): Payer: BLUE CROSS/BLUE SHIELD

## 2016-02-20 ENCOUNTER — Ambulatory Visit (HOSPITAL_BASED_OUTPATIENT_CLINIC_OR_DEPARTMENT_OTHER): Payer: BLUE CROSS/BLUE SHIELD | Admitting: Hematology & Oncology

## 2016-02-20 ENCOUNTER — Ambulatory Visit: Payer: BLUE CROSS/BLUE SHIELD

## 2016-02-20 ENCOUNTER — Ambulatory Visit (HOSPITAL_BASED_OUTPATIENT_CLINIC_OR_DEPARTMENT_OTHER)
Admission: RE | Admit: 2016-02-20 | Discharge: 2016-02-20 | Disposition: A | Discharge status: Home / Self Care | Payer: BLUE CROSS/BLUE SHIELD | Source: Ambulatory Visit | Attending: Hematology & Oncology | Admitting: Hematology & Oncology

## 2016-02-20 ENCOUNTER — Encounter (HOSPITAL_BASED_OUTPATIENT_CLINIC_OR_DEPARTMENT_OTHER): Payer: Self-pay

## 2016-02-20 DIAGNOSIS — R918 Other nonspecific abnormal finding of lung field: Secondary | ICD-10-CM | POA: Diagnosis not present

## 2016-02-20 DIAGNOSIS — M799 Soft tissue disorder, unspecified: Secondary | ICD-10-CM | POA: Insufficient documentation

## 2016-02-20 DIAGNOSIS — R935 Abnormal findings on diagnostic imaging of other abdominal regions, including retroperitoneum: Secondary | ICD-10-CM | POA: Diagnosis not present

## 2016-02-20 DIAGNOSIS — Z85038 Personal history of other malignant neoplasm of large intestine: Secondary | ICD-10-CM | POA: Diagnosis not present

## 2016-02-20 DIAGNOSIS — C184 Malignant neoplasm of transverse colon: Secondary | ICD-10-CM

## 2016-02-20 LAB — CMP (CANCER CENTER ONLY)
ALK PHOS: 51 U/L (ref 26–84)
ALT(SGPT): 13 U/L (ref 10–47)
AST: 22 U/L (ref 11–38)
Albumin: 4 g/dL (ref 3.3–5.5)
BUN: 10 mg/dL (ref 7–22)
CALCIUM: 9.2 mg/dL (ref 8.0–10.3)
CHLORIDE: 105 meq/L (ref 98–108)
CO2: 28 mEq/L (ref 18–33)
Creat: 0.6 mg/dl (ref 0.6–1.2)
GLUCOSE: 83 mg/dL (ref 73–118)
POTASSIUM: 3.8 meq/L (ref 3.3–4.7)
Sodium: 142 mEq/L (ref 128–145)
TOTAL PROTEIN: 6.8 g/dL (ref 6.4–8.1)
Total Bilirubin: 0.7 mg/dl (ref 0.20–1.60)

## 2016-02-20 LAB — CEA (IN HOUSE-CHCC): CEA (CHCC-IN HOUSE): 2.14 ng/mL (ref 0.00–5.00)

## 2016-02-20 LAB — CBC WITH DIFFERENTIAL (CANCER CENTER ONLY)
BASO#: 0 10*3/uL (ref 0.0–0.2)
BASO%: 0.4 % (ref 0.0–2.0)
EOS ABS: 0.5 10*3/uL (ref 0.0–0.5)
EOS%: 7.7 % — ABNORMAL HIGH (ref 0.0–7.0)
HCT: 40.6 % (ref 34.8–46.6)
HGB: 14 g/dL (ref 11.6–15.9)
LYMPH#: 1.5 10*3/uL (ref 0.9–3.3)
LYMPH%: 22.4 % (ref 14.0–48.0)
MCH: 29.7 pg (ref 26.0–34.0)
MCHC: 34.5 g/dL (ref 32.0–36.0)
MCV: 86 fL (ref 81–101)
MONO#: 0.5 10*3/uL (ref 0.1–0.9)
MONO%: 6.8 % (ref 0.0–13.0)
NEUT#: 4.3 10*3/uL (ref 1.5–6.5)
NEUT%: 62.7 % (ref 39.6–80.0)
PLATELETS: 279 10*3/uL (ref 145–400)
RBC: 4.72 10*6/uL (ref 3.70–5.32)
RDW: 12.1 % (ref 11.1–15.7)
WBC: 6.8 10*3/uL (ref 3.9–10.0)

## 2016-02-20 LAB — LACTATE DEHYDROGENASE: LDH: 246 U/L — AB (ref 125–245)

## 2016-02-20 MED ORDER — IOPAMIDOL (ISOVUE-300) INJECTION 61%
100.0000 mL | Freq: Once | INTRAVENOUS | Status: AC | PRN
Start: 2016-02-20 — End: 2016-02-20
  Administered 2016-02-20: 100 mL via INTRAVENOUS

## 2016-02-20 NOTE — Patient Instructions (Signed)
CT Scan A CT scan is a kind of X-ray that makes pictures of parts of your body. It is a painless way for your doctor to see inside your body. The CT scanner is a large machine that has an opening. Your body moves through the opening so that pictures can be taken. BEFORE THE PROCEDURE  The day before the test, do not have drinks with caffeine. Drinks with caffeine include energy drinks, tea, soda, coffee, and hot chocolate.  On the day of the test:  Do not eat at least 4 hours before the test or as told by your doctor.  Do not drink anything but water at least 4 hours before the test or as told by your doctor.  Leave your jewelry at home. You will have to take off some or all of your clothing. Your doctor will give you a hospital gown to wear. PROCEDURE   You will lie on a table with your arms above your head.  An IV tube may be put in your arm. If this is done, liquid will be put through the tube. It may make you feel warm or give you a metal taste in your mouth.  The table you will be lying on will move into a large machine.  You will be able to see, hear, and talk to the person running the machine while you are in it. Follow that person's directions.  The machine will move around you to take pictures. Do not move.  When the machine is done taking pictures it will be turned off.  The table will be moved out of the machine.  If you had an IV tube put in, it will be removed. AFTER THE PROCEDURE Ask your doctor when to call or come in for your test results. This information is not intended to replace advice given to you by your health care provider. Make sure you discuss any questions you have with your health care provider. Document Released: 06/07/2008 Document Revised: 03/16/2013 Document Reviewed: 11/25/2012 Elsevier Interactive Patient Education  2017 [employer].

## 2016-02-20 NOTE — Progress Notes (Signed)
IV started for CT scan.  Labs drawn from IV.

## 2016-02-21 NOTE — Progress Notes (Signed)
Hematology and Oncology Follow Up Visit  Zinna Saulsbury [id number] 05-27-1970 45 y.o. 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 Golden is back for follow-up. She looks quite good. She and her husband discovered back from the [place] She had her birthday several weeks ago. Had a lot to celebrate given that she had her colostomy reversal in August. This went well. From the operative report, there were quite a lot of adhesions. Nothing was noted that was unusual. No obvious masses were seen. The colostomy was reversed. She was in the hospital for a couple days.  She does state she has some pressure down in the pelvis. She has no urinary frequency. She is does have some skin loose bowels which is not surprising.  What is surprising as the CT scan that she had done today. Surprisingly, this shows development of a 5 x 3.7 cm presacral soft tissue mass just superior to the colonic anastomosis. Also noted is a 1.7 cm left upper abdominal nodule. There is also a another 1.9 x 1.6 and her right upper abdominal lesion. A third 1 cm nodule is located at the splenic flexure. I'm not sure exactly what all this indicates but recurrent disease certainly is a concern.  She has had normal CEA levels. Her last level back in July was 1.3.   She's had no cough or shortness of breath. Has been no bleeding. She's had no fever. She had no leg swelling.  Overall, her performance status is ECOG 1.  Medications:  Current Outpatient Prescriptions:  .  acetaminophen (TYLENOL) 500 MG tablet, Take 1,000 mg by mouth every 8 (eight) hours as needed., Disp: , Rfl:  .  fexofenadine (ALLEGRA) 180 MG tablet, Take 180 mg by mouth daily as needed for allergies. , Disp: , Rfl:  .  fluticasone (FLONASE) 50 MCG/ACT nasal spray, Place into both  nostrils 2 (two) times daily as needed for allergies or rhinitis., Disp: , Rfl:  .  ibuprofen (ADVIL,MOTRIN) 200 MG tablet, Take 400 mg by mouth every 6 (six) hours as needed for headache or mild pain., Disp: , Rfl:  No current facility-administered medications for this visit.   Facility-Administered Medications Ordered in Other Visits:  .  clindamycin (CLEOCIN) 900 mg in dextrose 5 % 50 mL IVPB, 900 mg, Intravenous, 60 min Pre-Op **AND** gentamicin (GARAMYCIN) 340 mg in dextrose 5 % 50 mL IVPB, 5 mg/kg, Intravenous, 60 min Pre-Op, [name], MD  Allergies:  Allergies  Allergen Reactions  . Bactrim [Sulfamethoxazole-Trimethoprim]   . Oxaprozin Hives    REACTION: Hives  . Penicillins Hives    REACTION: Hives Has patient had a PCN reaction causing immediate rash, facial/tongue/throat swelling, SOB or lightheadedness with hypotension: no Has patient had a PCN reaction causing severe rash involving mucus membranes or skin necrosis: {no Has patient had a PCN reaction that required hospitalization no Has patient had a PCN reaction occurring within the last 10 years: no If all of the above answers are "NO", then may proceed with Cephalosporin use.  . Sulfonamide Derivatives Swelling    Massive extremity swelling     Past Medical History, Surgical history, Social history, and Family History were reviewed and updated.  Review of Systems: As above  Physical Exam:  vitals were not taken for this visit.  Wt Readings from Last 3 Encounters:  11/15/15 161 lb (73 kg)  11/02/15 161 lb (73 kg)  10/10/15 158 lb (71.7 kg)     Head and neck exam shows no ocular or oral lesions. She has no palpable cervical or supraclavicular lymph nodes. Lungs are clear to percussion and auscultation bilaterally. Cardiac exam regular rate and rhythm with no murmurs, rubs or bruits. Abdomen is soft. She has good bowel sounds. She has the well-healed laparoscopy scars. The colostomy site is healing. There is  no fluid wave. There is no guarding or rebound tenderness.There is no palpable liver or spleen tip. Back exam shows no tenderness over the spine, ribs or hips. Extremity shows no clubbing, cyanosis or edema. Neurological exam shows no focal neurological deficit. Skin exam shows no rashes, ecchymoses or petechia.   Lab Results  Component Value Date   WBC 6.8 02/20/2016   HGB 14.0 02/20/2016   HCT 40.6 02/20/2016   MCV 86 02/20/2016   PLT 279 02/20/2016     Chemistry      Component Value Date/Time   NA 142 02/20/2016 0915   NA 141 08/25/2015 0820   K 3.8 02/20/2016 0915   K 4.7 08/25/2015 0820   CL 105 02/20/2016 0915   CO2 28 02/20/2016 0915   CO2 23 08/25/2015 0820   BUN 10 02/20/2016 0915   BUN 16.9 08/25/2015 0820   CREATININE 0.6 02/20/2016 0915   CREATININE 0.8 08/25/2015 0820      Component Value Date/Time   CALCIUM 9.2 02/20/2016 0915   CALCIUM 9.3 08/25/2015 0820   ALKPHOS 51 02/20/2016 0915   ALKPHOS 52 08/25/2015 0820   AST 22 02/20/2016 0915   AST 20 08/25/2015 0820   ALT 13 02/20/2016 0915   ALT 21 08/25/2015 0820   BILITOT 0.70 02/20/2016 0915   BILITOT 0.94 08/25/2015 0820         Impression and Plan: Ms. Repa is a 45 year old white female. She has a stage IIB adenocarcinoma of the sigmoid colon.again, her Oncotype score is a lot higher than I thought it would be.  I am very puzzled by the CT scan. I'm absolutely stunned that she would have recurrent disease. We just finished her treatments about 5 or 6 months ago. She had her colostomy reversal about 2 and half months ago. I would've thought that they would've seen something at the time of the surgery.  We will have to see what the CEA level is.  I think a PET scan is clearly indicated now. We will have to see what the PET scan shows. If the PET scan shows obvious activity, then we will have to get a biopsy.  I think that she has recurrent disease, by the pattern of recurrence, I would suspect that  she might benefit from HIPEC. I would refer her to [facility] for this. It does not look like that there is a lot of disease if she does have recurrence.  I spent about 40 minutes with she and her husband. I really was not expecting this at all. Again, I would be shocked that for somebody with stage II disease, that there would be recurrence so quickly. Maybe, overseen is reflective of her colostomy reversal.  We will plan to get her back depending on the PET scan result. In the PET scan and CEA levels are normal, then I probably would get her back in 2 months with another CT scan. If the PET scan is abnormal, or the  CEA is elevated, then we will have to get her back much more quickly.  Volanda Napoleon, MD 11/29/20176:58 AM

## 2016-02-28 ENCOUNTER — Other Ambulatory Visit: Payer: Self-pay | Admitting: Hematology & Oncology

## 2016-02-28 ENCOUNTER — Encounter (HOSPITAL_COMMUNITY)
Admission: RE | Admit: 2016-02-28 | Discharge: 2016-02-28 | Disposition: A | Discharge status: Home / Self Care | Payer: BLUE CROSS/BLUE SHIELD | Source: Ambulatory Visit | Attending: Hematology & Oncology | Admitting: Hematology & Oncology

## 2016-02-28 DIAGNOSIS — D490 Neoplasm of unspecified behavior of digestive system: Secondary | ICD-10-CM | POA: Diagnosis not present

## 2016-02-28 DIAGNOSIS — C184 Malignant neoplasm of transverse colon: Secondary | ICD-10-CM

## 2016-02-28 DIAGNOSIS — R918 Other nonspecific abnormal finding of lung field: Secondary | ICD-10-CM | POA: Diagnosis not present

## 2016-02-28 DIAGNOSIS — C185 Malignant neoplasm of splenic flexure: Secondary | ICD-10-CM | POA: Insufficient documentation

## 2016-02-28 DIAGNOSIS — M799 Soft tissue disorder, unspecified: Secondary | ICD-10-CM | POA: Diagnosis not present

## 2016-02-28 DIAGNOSIS — R935 Abnormal findings on diagnostic imaging of other abdominal regions, including retroperitoneum: Secondary | ICD-10-CM | POA: Diagnosis not present

## 2016-02-28 LAB — GLUCOSE, CAPILLARY: GLUCOSE-CAPILLARY: 101 mg/dL — AB (ref 65–99)

## 2016-02-28 MED ORDER — FLUDEOXYGLUCOSE F - 18 (FDG) INJECTION
7.5200 | Freq: Once | INTRAVENOUS | Status: AC | PRN
Start: 2016-02-28 — End: 2016-02-28
  Administered 2016-02-28: 7.52 via INTRAVENOUS

## 2016-02-29 LAB — CEA (PARALLEL TESTING): CEA: 0.6 ng/mL

## 2016-03-08 ENCOUNTER — Other Ambulatory Visit: Payer: Self-pay | Admitting: Student

## 2016-03-11 ENCOUNTER — Other Ambulatory Visit: Payer: Self-pay | Admitting: Radiology

## 2016-03-13 ENCOUNTER — Ambulatory Visit (HOSPITAL_COMMUNITY)
Admission: RE | Admit: 2016-03-13 | Discharge: 2016-03-13 | Disposition: A | Discharge status: Home / Self Care | Payer: BLUE CROSS/BLUE SHIELD | Source: Ambulatory Visit | Attending: Hematology & Oncology | Admitting: Hematology & Oncology

## 2016-03-13 ENCOUNTER — Encounter (HOSPITAL_COMMUNITY): Payer: Self-pay

## 2016-03-13 DIAGNOSIS — C184 Malignant neoplasm of transverse colon: Secondary | ICD-10-CM | POA: Insufficient documentation

## 2016-03-13 DIAGNOSIS — C189 Malignant neoplasm of colon, unspecified: Secondary | ICD-10-CM | POA: Diagnosis not present

## 2016-03-13 DIAGNOSIS — C185 Malignant neoplasm of splenic flexure: Secondary | ICD-10-CM | POA: Diagnosis not present

## 2016-03-13 DIAGNOSIS — M25552 Pain in left hip: Secondary | ICD-10-CM | POA: Diagnosis not present

## 2016-03-13 DIAGNOSIS — Z933 Colostomy status: Secondary | ICD-10-CM | POA: Insufficient documentation

## 2016-03-13 DIAGNOSIS — Z880 Allergy status to penicillin: Secondary | ICD-10-CM | POA: Insufficient documentation

## 2016-03-13 DIAGNOSIS — C786 Secondary malignant neoplasm of retroperitoneum and peritoneum: Secondary | ICD-10-CM | POA: Diagnosis not present

## 2016-03-13 DIAGNOSIS — Z79899 Other long term (current) drug therapy: Secondary | ICD-10-CM | POA: Diagnosis not present

## 2016-03-13 DIAGNOSIS — Z888 Allergy status to other drugs, medicaments and biological substances status: Secondary | ICD-10-CM | POA: Insufficient documentation

## 2016-03-13 DIAGNOSIS — Z9889 Other specified postprocedural states: Secondary | ICD-10-CM | POA: Insufficient documentation

## 2016-03-13 DIAGNOSIS — Z9221 Personal history of antineoplastic chemotherapy: Secondary | ICD-10-CM | POA: Insufficient documentation

## 2016-03-13 DIAGNOSIS — R1901 Right upper quadrant abdominal swelling, mass and lump: Secondary | ICD-10-CM | POA: Diagnosis not present

## 2016-03-13 LAB — PROTIME-INR
INR: 0.94
PROTHROMBIN TIME: 12.5 s (ref 11.4–15.2)

## 2016-03-13 LAB — CBC WITH DIFFERENTIAL/PLATELET
BASOS PCT: 1 %
Basophils Absolute: 0 10*3/uL (ref 0.0–0.1)
EOS ABS: 0.6 10*3/uL (ref 0.0–0.7)
Eosinophils Relative: 6 %
HCT: 41.9 % (ref 36.0–46.0)
HEMOGLOBIN: 15 g/dL (ref 12.0–15.0)
Lymphocytes Relative: 17 %
Lymphs Abs: 1.5 10*3/uL (ref 0.7–4.0)
MCH: 30.2 pg (ref 26.0–34.0)
MCHC: 35.8 g/dL (ref 30.0–36.0)
MCV: 84.5 fL (ref 78.0–100.0)
MONO ABS: 0.5 10*3/uL (ref 0.1–1.0)
MONOS PCT: 5 %
NEUTROS PCT: 71 %
Neutro Abs: 6.2 10*3/uL (ref 1.7–7.7)
PLATELETS: 350 10*3/uL (ref 150–400)
RBC: 4.96 MIL/uL (ref 3.87–5.11)
RDW: 12.3 % (ref 11.5–15.5)
WBC: 8.7 10*3/uL (ref 4.0–10.5)

## 2016-03-13 MED ORDER — FENTANYL CITRATE (PF) 100 MCG/2ML IJ SOLN
INTRAMUSCULAR | Status: AC
  Filled 2016-03-13: qty 4

## 2016-03-13 MED ORDER — MIDAZOLAM HCL 2 MG/2ML IJ SOLN
INTRAMUSCULAR | Status: AC
  Filled 2016-03-13: qty 6

## 2016-03-13 MED ORDER — SODIUM CHLORIDE 0.9 % IV SOLN
INTRAVENOUS | Status: DC
  Administered 2016-03-13: 08:00:00 via INTRAVENOUS

## 2016-03-13 MED ORDER — FENTANYL CITRATE (PF) 100 MCG/2ML IJ SOLN
INTRAMUSCULAR | Status: AC | PRN
  Administered 2016-03-13: 25 ug via INTRAVENOUS
  Administered 2016-03-13: 50 ug via INTRAVENOUS

## 2016-03-13 MED ORDER — MIDAZOLAM HCL 2 MG/2ML IJ SOLN
INTRAMUSCULAR | Status: AC | PRN
  Administered 2016-03-13 (×5): 1 mg via INTRAVENOUS

## 2016-03-13 NOTE — Discharge Instructions (Signed)
Needle Biopsy, Care After °Introduction °These instructions give you information about caring for yourself after your procedure. Your doctor may also give you more specific instructions. Call your doctor if you have any problems or questions after your procedure. °Follow these instructions at home: °· Rest as told by your doctor. °· Take medicines only as told by your doctor. °· There are many different ways to close and cover the biopsy site, including stitches (sutures), skin glue, and adhesive strips. Follow instructions from your doctor about: °¨ How to take care of your biopsy site. °¨ When and how you should change your bandage (dressing). °¨ When you should remove your dressing. °¨ Removing whatever was used to close your biopsy site. °· Check your biopsy site every day for signs of infection. Watch for: °¨ Redness, swelling, or pain. °¨ Fluid, blood, or pus. °Contact a doctor if: °· You have a fever. °· You have redness, swelling, or pain at the biopsy site, and it lasts longer than a few days. °· You have fluid, blood, or pus coming from the biopsy site. °· You feel sick to your stomach (nauseous). °· You throw up (vomit). °Get help right away if: °· You are short of breath. °· You have trouble breathing. °· Your chest hurts. °· You feel dizzy or you pass out (faint). °· You have bleeding that does not stop with pressure or a bandage. °· You cough up blood. °· Your belly (abdomen) hurts. °This information is not intended to replace advice given to you by your health care provider. Make sure you discuss any questions you have with your health care provider. °Document Released: 02/22/2008 Document Revised: 08/17/2015 Document Reviewed: 03/07/2014 °© 2017 Elsevier °Moderate Conscious Sedation, Adult, Care After °These instructions provide you with information about caring for yourself after your procedure. Your health care provider may also give you more specific instructions. Your treatment has been planned  according to current medical practices, but problems sometimes occur. Call your health care provider if you have any problems or questions after your procedure. °What can I expect after the procedure? °After your procedure, it is common: °· To feel sleepy for several hours. °· To feel clumsy and have poor balance for several hours. °· To have poor judgment for several hours. °· To vomit if you eat too soon. °Follow these instructions at home: °For at least 24 hours after the procedure:  °· Do not: °¨ Participate in activities where you could fall or become injured. °¨ Drive. °¨ Use heavy machinery. °¨ Drink alcohol. °¨ Take sleeping pills or medicines that cause drowsiness. °¨ Make important decisions or sign legal documents. °¨ Take care of children on your own. °· Rest. °Eating and drinking °· Follow the diet recommended by your health care provider. °· If you vomit: °¨ Drink water, juice, or soup when you can drink without vomiting. °¨ Make sure you have little or no nausea before eating solid foods. °General instructions °· Have a responsible adult stay with you until you are awake and alert. °· Take over-the-counter and prescription medicines only as told by your health care provider. °· If you smoke, do not smoke without supervision. °· Keep all follow-up visits as told by your health care provider. This is important. °Contact a health care provider if: °· You keep feeling nauseous or you keep vomiting. °· You feel light-headed. °· You develop a rash. °· You have a fever. °Get help right away if: °· You have trouble breathing. °This information   is not intended to replace advice given to you by your health care provider. Make sure you discuss any questions you have with your health care provider. °Document Released: 12/30/2012 Document Revised: 08/14/2015 Document Reviewed: 07/01/2015 °Elsevier Interactive Patient Education © 2017 Elsevier Inc. ° °

## 2016-03-13 NOTE — Consult Note (Signed)
Chief Complaint: Patient was seen in consultation today for CT guided omental mass biopsy  Referring Physician(s): Ennever,Peter R  Supervising Physician: Daryll Brod  Patient Status: [facility] - Out-pt  History of Present Illness: Carla Mills is a 45 y.o. female with history of stage II colon cancer originally diagnosed in 2016, status post LAR and colostomy reversal as well as chemotherapy. Recent PET scan on 02/28/16 has revealed abnormal hypermetabolic implants of tumor in the omentum and mesentery and along the left paracolic peritoneal margin as well as right pelvic sidewall. There also appears to be a dominant recurrence just above the rectal pouch. She presents today for CT-guided omental mass biopsy for further evaluation.  Past Medical History:  Diagnosis Date  . Colonic cancer ([place]) 03/09/2015  . History of chemotherapy    completed on 09/12/2015   . PONV (postoperative nausea and vomiting)   . Rectal mass 02/20/15   Rectal/sigmoid mass    Past Surgical History:  Procedure Laterality Date  . APPENDECTOMY  02/20/15   Abnormal appearing  . CESAREAN SECTION     x 2  . COLONOSCOPY N/A 10/09/2015   Procedure: COLONOSCOPY;  Surgeon: Leighton Ruff, MD;  Location: [facility] ENDOSCOPY;  Service: Endoscopy;  Laterality: N/A;  . COLOSTOMY  02/20/15   Sigmoid/rectal mass  . COLOSTOMY N/A 02/20/2015   Procedure: COLOSTOMY;  Surgeon: [name], MD;  Location: [place];  Service: General;  Laterality: N/A;  . COLOSTOMY REVERSAL N/A 11/15/2015   Procedure: COLOSTOMY REVERSAL;  Surgeon: [name], MD;  Location: [facility] ORS;  Service: General;  Laterality: N/A;  . EXPLORATORY LAPAROTOMY  02/20/15   Sigmoid/rectal mass  . FLEXIBLE SIGMOIDOSCOPY N/A 02/15/2015   Procedure: FLEXIBLE SIGMOIDOSCOPY;  Surgeon: [name], MD;  Location: [facility] ENDOSCOPY;  Service: Endoscopy;  Laterality: N/A;  . LAPAROSCOPIC LYSIS OF ADHESIONS N/A 11/15/2015   Procedure: LAPAROSCOPIC LYSIS OF ADHESIONS;   Surgeon: [name], MD;  Location: [facility] OR[facility];  Service: General;  Laterality: N/A;  . LAPAROSCOPY N/A 02/20/2015   Procedure: LAPAROSCOPY DIAGNOSTIC;  Surgeon: [name], MD;  Location: [place];  Service: General;  Laterality: N/A;  . LAPAROSCOPY ABDOMEN DIAGNOSTIC  02/20/15   Converted to open - Sigmoid/rectal mass  . LAPAROTOMY N/A 02/20/2015   Procedure: EXPLORATORY LAPAROTOMY AND PARTIAL COLECTOMY;  Surgeon: [name], MD;  Location: [place];  Service: General;  Laterality: N/A;  . LOW ANTERIOR BOWEL RESECTION  02/20/15   Sigmoid/rectal mass  . OSTEOTOMY     reconstructed coccyx   . reconstructed nerve in ankle     . TONSILLECTOMY      Allergies: Bactrim [sulfamethoxazole-trimethoprim]; Oxaprozin; Penicillins; and Sulfonamide derivatives  Medications: Prior to Admission medications   Medication Sig Start Date End Date Taking? Authorizing Provider  acetaminophen (TYLENOL) 500 MG tablet Take 1,000 mg by mouth every 8 (eight) hours as needed.   Yes Historical Provider, MD  fexofenadine (ALLEGRA) 180 MG tablet Take 180 mg by mouth daily as needed for allergies.    Yes Historical Provider, MD  fluticasone (FLONASE) 50 MCG/ACT nasal spray Place into both nostrils 2 (two) times daily as needed for allergies or rhinitis.   Yes Historical Provider, MD  ibuprofen (ADVIL,MOTRIN) 200 MG tablet Take 400 mg by mouth every 6 (six) hours as needed for headache or mild pain.   Yes Historical Provider, MD     History reviewed. No pertinent family history.  Social History   Social History  . Marital status: Married    Spouse  name: N/A  . Number of children: N/A  . Years of education: N/A   Social History Main Topics  . Smoking status: Never Smoker  . Smokeless tobacco: Never Used  . Alcohol use 0.0 oz/week     Comment: socially   . Drug use: No  . Sexual activity: Not Asked   Other Topics Concern  . None   Social History Narrative  . None     Review of Systems denies  fever, chest pain, dyspnea, cough, abdominal pain, back pain, nausea, vomiting or abnormal bleeding. She does have occ HA's and bilat hip pain, constipation  Vital Signs: BP 103/81 (BP Location: Right Arm)   Pulse 87   Temp 98.9 F (37.2 C) (Oral)   Resp 18   SpO2 98%   Physical Exam awake, alert. Chest clear to auscultation bilaterally. Heart with regular rate and rhythm. Abdomen soft, positive bowel sounds, nontender. Lower extremities - no edema.  Mallampati Score:     Imaging: Ct Abdomen Pelvis W Contrast  Result Date: 02/20/2016 CLINICAL DATA:  Patient with history of colon cancer status post chemotherapy and surgery. EXAM: CT ABDOMEN AND PELVIS WITH CONTRAST TECHNIQUE: Multidetector CT imaging of the abdomen and pelvis was performed using the standard protocol following bolus administration of intravenous contrast. CONTRAST:  159mL ISOVUE-300 IOPAMIDOL (ISOVUE-300) INJECTION 61% COMPARISON:  CT abdomen pelvis 10/10/2015. FINDINGS: Lower chest: Normal heart size. Dependent atelectasis within the bilateral lower lobes. No pleural effusion. Hepatobiliary: Stable sub cm too small to characterize low-attenuation lesions predominately within the left hepatic lobe and about the gallbladder fossa. No new or enlarging hepatic lesions are identified. Gallbladder is unremarkable. Pancreas: Unremarkable Spleen: Unremarkable Adrenals/Urinary Tract: The adrenal glands are normal. Kidneys enhance symmetrically with contrast. No hydronephrosis. Urinary bladder is unremarkable. Stomach/Bowel: Patient status post interval colostomy takedown and anastomosis. No evidence for bowel obstruction. Normal morphology of the stomach. Vascular/Lymphatic: Normal caliber abdominal aorta. No retroperitoneal lymphadenopathy. Reproductive: Interval development of dilated tubular structures which appear to be fluid filled within the right greater than left adnexa. The ovaries appear unremarkable. Uterus is anteverted.  Other: Interval development of a 5.0 x 3.7 cm presacral soft tissue mass just superior to the colonic anastomosis (image 69; series 2). Interval development of a 1.7 cm soft tissue nodule within the left upper hemi abdomen (image 29; series 2). Interval development of a 1.9 x 1.6 cm soft tissue nodule within the right upper hemi abdomen (image 38; series 2). Interval development of a 1.0 cm soft tissue nodule posterior to the colon at the splenic flexure (image 35; series 2). Musculoskeletal: Scarring within the subcutaneous fat of the left lower anterior abdominal wall. IMPRESSION: Interval development of enhancing presacral soft tissue mass as well as additional soft tissue nodules within the left upper and right upper quadrants most compatible with peritoneal metastatic disease. Interval development of dilated fluid-filled tubular structures within the right greater than left adnexa, most suggestive of hydrosalpinx. Less likely cystic ovarian mass. Consider correlation with pelvic ultrasound. No evidence for bowel obstruction. Electronically Signed   By: Lovey Newcomer M.D.   On: 02/20/2016 10:20   Nm Pet Image Initial (pi) Skull Base To Thigh  Result Date: 02/28/2016 CLINICAL DATA:  Subsequent treatment strategy for transverse colon cancer diagnosed 02/20/2015. Prior low anterior resection. EXAM: NUCLEAR MEDICINE PET SKULL BASE TO THIGH TECHNIQUE: 7.5 mCi F-18 FDG was injected intravenously. Full-ring PET imaging was performed from the skull base to thigh after the radiotracer. CT data was obtained and  used for attenuation correction and anatomic localization. FASTING BLOOD GLUCOSE:  Value: 101 mg/dl COMPARISON:  Multiple exams, including 11/20/2015 FINDINGS: NECK No hypermetabolic lymph nodes in the neck. CHEST No hypermetabolic mediastinal or hilar nodes. Indistinct suspected subpleural lymph node in the superior segment left lower lobe, 4 mm diameter, image 27/8, not hypermetabolic and probably benign, but  perhaps meriting observation. ABDOMEN/PELVIS Hypermetabolic omental implant in the left upper quadrant measuring 1.9 by 1.7 cm, essentially stable in size, maximum SUV 12.0. Omental implant in the right upper quadrant measuring approximately 2.6 by 1.8 cm, formerly the same by my measurements, maximum SUV 14.6 Peritoneal implant along the left paracolic gutter, 1.4 by 1.2 cm on image 119/4, previously 1.3 by 1.1 cm by my measurements, maximum SUV 7.8 The dominant upper presacral mass just above the anastomotic staple line has a maximum SUV of 20.9. Multiple mesenteric implants of tumor just above the uterus are hypermetabolic. Tubular cystic lesion in the right pelvis noted, possibly hydrosalpinx ; along the left right pelvic sidewall there are several nodules which are hypermetabolic. Scattered small perirectal lymph nodes are present, but are not currently hypermetabolic. SKELETON No focal hypermetabolic activity to suggest skeletal metastasis. IMPRESSION: 1. Abnormal hypermetabolic implants of tumor in the omentum, in the pelvic mesentery, and along the left paracolic peritoneal margin, as well as along the right pelvic sidewall. A dominant recurrence just above the rectal pouch where there is a stable size highly hypermetabolic mass. No current metastatic disease to the liver identified. 2. 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 3. 4 mm subpleural nodule (likely an incidental lymph node) along the major fissure in the superior segment right lower lobe. This is probably benign but may merit observation. Electronically Signed   By: Van Clines M.D.   On: 02/28/2016 11:39    Labs:  CBC:  Recent Labs  11/02/15 0915 11/16/15 0447 02/20/16 0915 03/13/16 0724  WBC 7.0 11.8* 6.8 8.7  HGB 14.6 13.2 14.0 15.0  HCT 42.9 38.1 40.6 41.9  PLT 273 289 279 350    COAGS:  Recent Labs  03/13/16 0724  INR 0.94    BMP:  Recent Labs  10/10/15 1152 11/02/15 0915 11/16/15 0447 02/20/16 0915  NA 135 138  140 142  K 3.6 4.3 3.9 3.8  CL 104 106 110 105  CO2 25 27 25 28   GLUCOSE 85 84 116* 83  BUN 12 13 7 10   CALCIUM 8.9 9.1 8.3* 9.2  CREATININE 0.5* 0.62 0.42* 0.6  GFRNONAA  --  >60 >60  --   GFRAA  --  >60 >60  --     LIVER FUNCTION TESTS:  Recent Labs  08/03/15 1200 08/25/15 0820 10/10/15 1152 02/20/16 0915  BILITOT 1.50 0.94 1.10 0.70  AST 31 20 22 22   ALT 36 21 21 13   ALKPHOS 55 52 46 51  PROT 7.1 7.0 7.0 6.8  ALBUMIN 4.2 4.1 4.2 4.0    TUMOR MARKERS:  Recent Labs  06/22/15 1047 10/10/15 1152 02/20/16 0915  CEA 1.7 1.3 0.6    Assessment and Plan: 45 y.o. female with history of stage II colon cancer originally diagnosed in 2016, status post LAR and colostomy reversal as well as chemotherapy. Recent PET scan on 02/28/16 has revealed abnormal hypermetabolic implants of tumor in the omentum and mesentery and along the left paracolic peritoneal margin as well as right pelvic sidewall. There also appears to be a dominant recurrence just above the rectal pouch. She presents today for CT-guided omental mass biopsy  for further evaluation.Risks and benefits discussed with the patient/family including, but not limited to bleeding, infection, damage to adjacent structures or low yield requiring additional tests. All of the patient's questions were answered, patient is agreeable to proceed. Consent signed and in chart.     Thank you for this interesting consult.  I greatly enjoyed meeting Denisha Bein and look forward to participating in their care.  A copy of this report was sent to the requesting provider on this date.  Electronically Signed: D. Rowe Robert 03/13/2016, 8:17 AM   I spent a total of 25 minutes in face to face in clinical consultation, greater than 50% of which was counseling/coordinating care for image guided omental mass biopsy

## 2016-03-13 NOTE — Procedures (Signed)
Colon cancer, new omental masses  S/P CT BX RUQ OMENTAL MASS  No comp Stable Path pending Full report in PACS

## 2016-03-14 ENCOUNTER — Other Ambulatory Visit: Payer: Self-pay | Admitting: Hematology & Oncology

## 2016-03-14 ENCOUNTER — Telehealth: Payer: Self-pay | Admitting: Hematology & Oncology

## 2016-03-14 DIAGNOSIS — C184 Malignant neoplasm of transverse colon: Secondary | ICD-10-CM

## 2016-03-14 MED ORDER — TRAMADOL HCL 50 MG PO TABS: 50.0000 mg | ORAL_TABLET | Freq: Four times a day (QID) | ORAL | 0 refills | Status: DC | PRN

## 2016-03-14 NOTE — Telephone Encounter (Signed)
I called pathology lab at [facility] on 03/14/2016 at 4:20PM and had the pathologist run the omental bx for KRAS/BRAF/HER2/MMR/MSI.  Laurey Arrow

## 2016-03-15 ENCOUNTER — Telehealth: Payer: Self-pay

## 2016-03-15 ENCOUNTER — Other Ambulatory Visit: Payer: Self-pay

## 2016-03-15 DIAGNOSIS — C184 Malignant neoplasm of transverse colon: Secondary | ICD-10-CM

## 2016-03-15 MED ORDER — TRAMADOL HCL 50 MG PO TABS: 50.0000 mg | ORAL_TABLET | Freq: Four times a day (QID) | ORAL | 0 refills | Status: DC | PRN

## 2016-03-15 MED FILL — traMADol HCL 50 MG TABS: 50 | 23 days supply | Qty: 90 | Fill #0

## 2016-03-15 NOTE — Telephone Encounter (Signed)
New Patient Referral made to Dr Jyl Heinz office at [facility]. Appt made for 04/02/2016 at 0900. Patient notified by phone and will expect a new patient packet in the mail. dph

## 2016-03-22 DIAGNOSIS — M6248 Contracture of muscle, other site: Secondary | ICD-10-CM | POA: Diagnosis not present

## 2016-03-22 DIAGNOSIS — M25511 Pain in right shoulder: Secondary | ICD-10-CM | POA: Diagnosis not present

## 2016-03-22 DIAGNOSIS — M9903 Segmental and somatic dysfunction of lumbar region: Secondary | ICD-10-CM | POA: Diagnosis not present

## 2016-03-22 DIAGNOSIS — M9901 Segmental and somatic dysfunction of cervical region: Secondary | ICD-10-CM | POA: Diagnosis not present

## 2016-03-29 ENCOUNTER — Telehealth: Payer: Self-pay | Admitting: *Deleted

## 2016-03-29 DIAGNOSIS — C189 Malignant neoplasm of colon, unspecified: Secondary | ICD-10-CM | POA: Diagnosis not present

## 2016-03-29 DIAGNOSIS — M9903 Segmental and somatic dysfunction of lumbar region: Secondary | ICD-10-CM | POA: Diagnosis not present

## 2016-03-29 DIAGNOSIS — C786 Secondary malignant neoplasm of retroperitoneum and peritoneum: Secondary | ICD-10-CM | POA: Diagnosis not present

## 2016-03-29 DIAGNOSIS — M25511 Pain in right shoulder: Secondary | ICD-10-CM | POA: Diagnosis not present

## 2016-03-29 DIAGNOSIS — M9901 Segmental and somatic dysfunction of cervical region: Secondary | ICD-10-CM | POA: Diagnosis not present

## 2016-03-29 DIAGNOSIS — M6248 Contracture of muscle, other site: Secondary | ICD-10-CM | POA: Diagnosis not present

## 2016-03-29 NOTE — Telephone Encounter (Signed)
Orders given to [facility] Pathology on specimen [phone] for the following  KRAS BRAF MSI MMR

## 2016-04-01 ENCOUNTER — Other Ambulatory Visit (HOSPITAL_COMMUNITY)
Admission: RE | Admit: 2016-04-01 | Discharge: 2016-04-01 | Disposition: A | Discharge status: Home / Self Care | Payer: BLUE CROSS/BLUE SHIELD | Source: Ambulatory Visit | Attending: Hematology & Oncology | Admitting: Hematology & Oncology

## 2016-04-01 DIAGNOSIS — C786 Secondary malignant neoplasm of retroperitoneum and peritoneum: Secondary | ICD-10-CM | POA: Insufficient documentation

## 2016-04-01 DIAGNOSIS — C189 Malignant neoplasm of colon, unspecified: Secondary | ICD-10-CM | POA: Insufficient documentation

## 2016-04-02 DIAGNOSIS — C189 Malignant neoplasm of colon, unspecified: Secondary | ICD-10-CM | POA: Insufficient documentation

## 2016-04-02 DIAGNOSIS — G600 Hereditary motor and sensory neuropathy: Secondary | ICD-10-CM | POA: Insufficient documentation

## 2016-04-03 ENCOUNTER — Other Ambulatory Visit (HOSPITAL_BASED_OUTPATIENT_CLINIC_OR_DEPARTMENT_OTHER): Payer: BLUE CROSS/BLUE SHIELD

## 2016-04-03 ENCOUNTER — Other Ambulatory Visit: Payer: Self-pay | Admitting: *Deleted

## 2016-04-03 ENCOUNTER — Ambulatory Visit (HOSPITAL_BASED_OUTPATIENT_CLINIC_OR_DEPARTMENT_OTHER): Payer: BLUE CROSS/BLUE SHIELD | Admitting: Hematology & Oncology

## 2016-04-03 ENCOUNTER — Encounter: Payer: Self-pay | Admitting: Hematology & Oncology

## 2016-04-03 DIAGNOSIS — C184 Malignant neoplasm of transverse colon: Secondary | ICD-10-CM

## 2016-04-03 DIAGNOSIS — C189 Malignant neoplasm of colon, unspecified: Secondary | ICD-10-CM

## 2016-04-03 DIAGNOSIS — R970 Elevated carcinoembryonic antigen [CEA]: Secondary | ICD-10-CM

## 2016-04-03 DIAGNOSIS — C772 Secondary and unspecified malignant neoplasm of intra-abdominal lymph nodes: Secondary | ICD-10-CM

## 2016-04-03 DIAGNOSIS — C187 Malignant neoplasm of sigmoid colon: Secondary | ICD-10-CM | POA: Insufficient documentation

## 2016-04-03 HISTORY — DX: Secondary and unspecified malignant neoplasm of intra-abdominal lymph nodes: C77.2

## 2016-04-03 HISTORY — DX: Secondary and unspecified malignant neoplasm of intra-abdominal lymph nodes: C18.7

## 2016-04-03 LAB — CBC WITH DIFFERENTIAL (CANCER CENTER ONLY)
BASO#: 0 10*3/uL (ref 0.0–0.2)
BASO%: 0.4 % (ref 0.0–2.0)
EOS ABS: 0.6 10*3/uL — AB (ref 0.0–0.5)
EOS%: 7.1 % — ABNORMAL HIGH (ref 0.0–7.0)
HEMATOCRIT: 39.5 % (ref 34.8–46.6)
HGB: 13.5 g/dL (ref 11.6–15.9)
LYMPH#: 1.4 10*3/uL (ref 0.9–3.3)
LYMPH%: 16.4 % (ref 14.0–48.0)
MCH: 29.9 pg (ref 26.0–34.0)
MCHC: 34.2 g/dL (ref 32.0–36.0)
MCV: 87 fL (ref 81–101)
MONO#: 0.4 10*3/uL (ref 0.1–0.9)
MONO%: 4.7 % (ref 0.0–13.0)
NEUT#: 6 10*3/uL (ref 1.5–6.5)
NEUT%: 71.4 % (ref 39.6–80.0)
PLATELETS: 347 10*3/uL (ref 145–400)
RBC: 4.52 10*6/uL (ref 3.70–5.32)
RDW: 12 % (ref 11.1–15.7)
WBC: 8.5 10*3/uL (ref 3.9–10.0)

## 2016-04-03 LAB — CMP (CANCER CENTER ONLY)
ALT(SGPT): 16 U/L (ref 10–47)
AST: 21 U/L (ref 11–38)
Albumin: 4 g/dL (ref 3.3–5.5)
Alkaline Phosphatase: 62 U/L (ref 26–84)
BUN: 13 mg/dL (ref 7–22)
CALCIUM: 8.9 mg/dL (ref 8.0–10.3)
CHLORIDE: 105 meq/L (ref 98–108)
CO2: 26 mEq/L (ref 18–33)
Creat: 0.7 mg/dl (ref 0.6–1.2)
Glucose, Bld: 95 mg/dL (ref 73–118)
POTASSIUM: 3.9 meq/L (ref 3.3–4.7)
Sodium: 140 mEq/L (ref 128–145)
TOTAL PROTEIN: 7.4 g/dL (ref 6.4–8.1)
Total Bilirubin: 0.9 mg/dl (ref 0.20–1.60)

## 2016-04-03 LAB — LACTATE DEHYDROGENASE: LDH: 213 U/L (ref 125–245)

## 2016-04-03 LAB — CEA (IN HOUSE-CHCC): CEA (CHCC-IN HOUSE): 3.48 ng/mL (ref 0.00–5.00)

## 2016-04-03 NOTE — Progress Notes (Signed)
Hematology and Oncology Follow Up Visit  Carla Mills [id number] 1971-03-10 46 y.o. 04/03/2016   Principle Diagnosis:   Recurrent colon cancer - HER2 (-)/MSI stable/MMR normal  Current Therapy:    Start FOLFOXIRI on 04/09/2016     Interim History:  [name] is back for follow-up. Unfortunately, we clearly have metastatic disease now. When I saw her back in the fall of 2017, we had a routine CT scan done. Unfortunately, this clearly showed that she had recurrence. We then got a biopsy. The pathology report ([id number]) showed metastatic adenocarcinoma. This is consistent with her colon cancer.  The K-ras/BRAF analysis is still pending. Is HER-2 negative. It is MSI/MMR normal.  I sent her to Dr. Jyl Heinz at [facility] for consideration of HIPEC therapy. He felt that she should have chemotherapy initially.  She really looks good. She had no problems over the holidays. She and her family went to [place] for 10 days. They really had a good time.  She is complaining of some pressure on her bladder. She has had no prods with her bowel movements. She might be a little constipated.  Her last CEA level was normal at 2.5.  Her last PET scan was done about 2 months ago. We clearly we'll have to get another one before we start any therapy on her.  Her appetite is good. She's had no nausea or vomiting. She's had no bleeding. She's had no fever. She's had no cough or shortness of breath.  Overall, her performance status is ECOG 0.  Medications:  Current Outpatient Prescriptions:  .  acetaminophen (TYLENOL) 500 MG tablet, Take 1,000 mg by mouth every 8 (eight) hours as needed., Disp: , Rfl:  .  fexofenadine (ALLEGRA) 180 MG tablet, Take 180 mg by mouth daily as needed for allergies. , Disp: , Rfl:  .  fluticasone (FLONASE) 50 MCG/ACT nasal spray, Place into both nostrils 2 (two) times daily as needed for allergies or rhinitis., Disp: , Rfl:  .  ibuprofen (ADVIL,MOTRIN) 200 MG tablet, Take  400 mg by mouth every 6 (six) hours as needed for headache or mild pain., Disp: , Rfl:  .  traMADol (ULTRAM) 50 MG tablet, Take 1 tablet (50 mg total) by mouth every 6 (six) hours as needed., Disp: 90 tablet, Rfl: 0 No current facility-administered medications for this visit.   Facility-Administered Medications Ordered in Other Visits:  .  clindamycin (CLEOCIN) 900 mg in dextrose 5 % 50 mL IVPB, 900 mg, Intravenous, 60 min Pre-Op **AND** gentamicin (GARAMYCIN) 340 mg in dextrose 5 % 50 mL IVPB, 5 mg/kg, Intravenous, 60 min Pre-Op, [name], MD  Allergies:  Allergies  Allergen Reactions  . Bactrim [Sulfamethoxazole-Trimethoprim]   . Oxaprozin Hives    REACTION: Hives  . Penicillins Hives    REACTION: Hives Has patient had a PCN reaction causing immediate rash, facial/tongue/throat swelling, SOB or lightheadedness with hypotension: no Has patient had a PCN reaction causing severe rash involving mucus membranes or skin necrosis: {no Has patient had a PCN reaction that required hospitalization no Has patient had a PCN reaction occurring within the last 10 years: no If all of the above answers are "NO", then may proceed with Cephalosporin use.  . Sulfonamide Derivatives Swelling    Massive extremity swelling     Past Medical History, Surgical history, Social history, and Family History were reviewed and updated.  Review of Systems:  As above  Physical Exam:  weight is 152 lb 12.8 oz (69.3 kg). Her oral temperature  is 97.6 F (36.4 C). Her blood pressure is 133/72 and her pulse is 67. Her respiration is 18.   Wt Readings from Last 3 Encounters:  04/03/16 152 lb 12.8 oz (69.3 kg)  11/15/15 161 lb (73 kg)  11/02/15 161 lb (73 kg)      Well-developed and well-nourished white female in no obvious distress. Head and neck exam shows no ocular or oral lesions. She has no palpable cervical or supraclavicular lymph nodes. Lungs are clear. Cardiac exam regular rate and rhythm with no  murmurs, rubs or bruits. Abdomen is soft. She has well-healed laparotomy scar. She has no fluid wave. There is no palpable liver or spleen tip. There is no obvious abdominal mass. She has no palpable inguinal lymph nodes. Back exam shows no tenderness over the spine, ribs or hips. Extremities shows no clubbing, cyanosis or edema. She's good range motion of her joints. Skin exam shows no rashes, ecchymoses or petechia. Neurological exam shows no focal neurological deficits.  Lab Results  Component Value Date   WBC 8.5 04/03/2016   HGB 13.5 04/03/2016   HCT 39.5 04/03/2016   MCV 87 04/03/2016   PLT 347 04/03/2016     Chemistry      Component Value Date/Time   NA 140 04/03/2016 0902   NA 141 08/25/2015 0820   K 3.9 04/03/2016 0902   K 4.7 08/25/2015 0820   CL 105 04/03/2016 0902   CO2 26 04/03/2016 0902   CO2 23 08/25/2015 0820   BUN 13 04/03/2016 0902   BUN 16.9 08/25/2015 0820   CREATININE 0.7 04/03/2016 0902   CREATININE 0.8 08/25/2015 0820      Component Value Date/Time   CALCIUM 8.9 04/03/2016 0902   CALCIUM 9.3 08/25/2015 0820   ALKPHOS 62 04/03/2016 0902   ALKPHOS 52 08/25/2015 0820   AST 21 04/03/2016 0902   AST 20 08/25/2015 0820   ALT 16 04/03/2016 0902   ALT 21 08/25/2015 0820   BILITOT 0.90 04/03/2016 0902   BILITOT 0.94 08/25/2015 0820         Impression and Plan: [name] is a 46 year old white female. She initially presented with stage II colon cancer back in I think December 2016. She had a very high Oncotype score of 37. Because of this, we went ahead and gave her adjuvant chemotherapy with Xeloda. Despite this, her cancer has recurred.  Her CEA has really never been that elevated. We checked it today and it was 3.48, which for her, is a little on the high side.  I really think that the HIPEC therapy would be the best for her. We will have to try to "chemically debulk her" initially. I think we should be able to do this.  I think, because of her  excellent performance status, that we could use FOLOXIRI. I think this would be a very active regimen.  Since I do not know her K-ras status, I do not know need to add a anti--EGFR agent or Avastin. My sense is that we should start with just chemotherapy. If we do not see a good response, then we can add either Erbitux or Avastin.  Of note, since her initial malignancy was left sided, she might be a good candidate for Erbitux if she is not K-ras mutated.  I talked to she and her husband for about 45 minutes. I gave him information sheets about the chemotherapy. She will need to have a Port-A-Cath placed.  I went over the side effects. She  understands that she may lose her hair. I told her that she may have diarrhea, neuropathy, PPP, lowered blood counts, increased risk of infection, diarrhea, rashes, mouth sores, nausea/vomiting. She understands all this.  I still think our goal should be cured. I really is that this might be difficult given that this is recurrent and metastatic. However, if we can chemically debulk her, then I think she would have a much better chance of having long-term remission with HIPEC therapy.  We'll go ahead and get started next week.  I will do 4 cycles of treatment and then repeat her PET scan. I do want to get a PET scan either this week or early next week so that we have a more recent scan for following.   Volanda Napoleon, MD 1/10/20185:14 PM

## 2016-04-03 NOTE — Progress Notes (Signed)
START OFF PATHWAY REGIMEN - Colorectal  Off Pathway: FOLFOXIRI q14 days **2 cycles per order sheet**  [id number]:FOLFOXIRI q14 days **2 cycles per order sheet**:   A cycle is every 14 days:     Irinotecan (Camptosar(R)) 165 mg/m2 in 500 mL D5W IV over 90 minutes day 1, q14 days. Dose Mod: None     Oxaliplatin (Eloxatin(R)) 85 mg/m2 in 250 mL D5W IV over 120 minutes day 1, q14 days (given at same time as leucovorin below) Dose Mod: None     Leucovorin 400 mg/m2* in 250 mL D5W IV over 120 minutes, concomitant with oxaliplatin day 1, q14 days, followed immediately by Dose Mod: None     5-Fluorouracil 3,200 mg/m2 in _______ mL NS IV as a 48 hour continuous infusion starting on day 1, q14 days (1600 mg/m2/day). Dose Mod: None Additional Orders: *Note: leucovorin dosing on this order sheet represents the d,l-racemic mixture, the equivalent LEVOleucovorin (l-leucovorin) dose would be 215m/m2. **Note: order sheet contains two q14 day cycles** Ref: Falcone et al. JCO. 2007; 25(13):1670.  **Always confirm dose/schedule in your pharmacy ordering system**    Patient Characteristics: Metastatic Colorectal, First Line, Potentially Resectable, KRAS Mutation Positive/Unknown, BRAF Wild-Type/Unknown AJCC T Stage: X AJCC N Stage: X AJCC Stage Grouping: IVB AJCC M Stage: 1b Current evidence of distant metastases? Yes BRAF Mutation Status: Awaiting Test Results KRAS/NRAS Mutation Status: Awaiting Test Results Line of therapy: First Line Would you be surprised if this patient died  in the next year? I would be surprised if this patient died in the next year  Intent of Therapy: Curative Intent, Discussed with Patient

## 2016-04-04 LAB — CEA (PARALLEL TESTING): CEA: 1.3 ng/mL

## 2016-04-05 ENCOUNTER — Other Ambulatory Visit: Payer: Self-pay | Admitting: Radiology

## 2016-04-08 ENCOUNTER — Other Ambulatory Visit: Payer: BLUE CROSS/BLUE SHIELD

## 2016-04-08 ENCOUNTER — Encounter (HOSPITAL_COMMUNITY): Payer: Self-pay

## 2016-04-08 ENCOUNTER — Other Ambulatory Visit: Payer: Self-pay | Admitting: Hematology & Oncology

## 2016-04-08 ENCOUNTER — Ambulatory Visit (HOSPITAL_COMMUNITY)
Admission: RE | Admit: 2016-04-08 | Discharge: 2016-04-08 | Disposition: A | Discharge status: Home / Self Care | Payer: BLUE CROSS/BLUE SHIELD | Source: Ambulatory Visit | Attending: Hematology & Oncology | Admitting: Hematology & Oncology

## 2016-04-08 ENCOUNTER — Encounter: Payer: Self-pay | Admitting: *Deleted

## 2016-04-08 DIAGNOSIS — C772 Secondary and unspecified malignant neoplasm of intra-abdominal lymph nodes: Principal | ICD-10-CM

## 2016-04-08 DIAGNOSIS — Z882 Allergy status to sulfonamides status: Secondary | ICD-10-CM | POA: Insufficient documentation

## 2016-04-08 DIAGNOSIS — C189 Malignant neoplasm of colon, unspecified: Secondary | ICD-10-CM | POA: Diagnosis not present

## 2016-04-08 DIAGNOSIS — Z880 Allergy status to penicillin: Secondary | ICD-10-CM | POA: Insufficient documentation

## 2016-04-08 DIAGNOSIS — Z7951 Long term (current) use of inhaled steroids: Secondary | ICD-10-CM | POA: Diagnosis not present

## 2016-04-08 DIAGNOSIS — C187 Malignant neoplasm of sigmoid colon: Secondary | ICD-10-CM | POA: Diagnosis not present

## 2016-04-08 DIAGNOSIS — Z9221 Personal history of antineoplastic chemotherapy: Secondary | ICD-10-CM | POA: Insufficient documentation

## 2016-04-08 DIAGNOSIS — Z5111 Encounter for antineoplastic chemotherapy: Secondary | ICD-10-CM | POA: Diagnosis not present

## 2016-04-08 HISTORY — PX: IR GENERIC HISTORICAL: IMG1180011

## 2016-04-08 LAB — CBC WITH DIFFERENTIAL/PLATELET
Basophils Absolute: 0 10*3/uL (ref 0.0–0.1)
Basophils Relative: 0 %
EOS ABS: 0.6 10*3/uL (ref 0.0–0.7)
EOS PCT: 7 %
HCT: 36.1 % (ref 36.0–46.0)
HEMOGLOBIN: 12.2 g/dL (ref 12.0–15.0)
LYMPHS ABS: 1.9 10*3/uL (ref 0.7–4.0)
Lymphocytes Relative: 23 %
MCH: 28.7 pg (ref 26.0–34.0)
MCHC: 33.8 g/dL (ref 30.0–36.0)
MCV: 84.9 fL (ref 78.0–100.0)
MONOS PCT: 5 %
Monocytes Absolute: 0.4 10*3/uL (ref 0.1–1.0)
NEUTROS PCT: 65 %
Neutro Abs: 5.2 10*3/uL (ref 1.7–7.7)
Platelets: 357 10*3/uL (ref 150–400)
RBC: 4.25 MIL/uL (ref 3.87–5.11)
RDW: 12.3 % (ref 11.5–15.5)
WBC: 8.1 10*3/uL (ref 4.0–10.5)

## 2016-04-08 LAB — PROTIME-INR
INR: 0.99
Prothrombin Time: 13.1 seconds (ref 11.4–15.2)

## 2016-04-08 MED ORDER — MIDAZOLAM HCL 2 MG/2ML IJ SOLN
INTRAMUSCULAR | Status: AC
  Filled 2016-04-08: qty 6

## 2016-04-08 MED ORDER — CLINDAMYCIN PHOSPHATE 900 MG/50ML IV SOLN
900.0000 mg | Freq: Once | INTRAVENOUS | Status: AC
Start: 2016-04-08 — End: 2016-04-08
  Administered 2016-04-08: 900 mg via INTRAVENOUS
  Filled 2016-04-08: qty 50

## 2016-04-08 MED ORDER — HEPARIN SOD (PORK) LOCK FLUSH 100 UNIT/ML IV SOLN
INTRAVENOUS | Status: AC
  Filled 2016-04-08: qty 5

## 2016-04-08 MED ORDER — LIDOCAINE-EPINEPHRINE (PF) 2 %-1:200000 IJ SOLN
INTRAMUSCULAR | Status: AC | PRN
  Administered 2016-04-08: 20 mL

## 2016-04-08 MED ORDER — LIDOCAINE-EPINEPHRINE (PF) 2 %-1:200000 IJ SOLN
INTRAMUSCULAR | Status: DC
Start: 2016-04-08 — End: 2016-04-09
  Filled 2016-04-08: qty 20

## 2016-04-08 MED ORDER — MIDAZOLAM HCL 2 MG/2ML IJ SOLN
INTRAMUSCULAR | Status: AC | PRN
  Administered 2016-04-08 (×4): 1 mg via INTRAVENOUS

## 2016-04-08 MED ORDER — FENTANYL CITRATE (PF) 100 MCG/2ML IJ SOLN
INTRAMUSCULAR | Status: AC | PRN
  Administered 2016-04-08: 50 ug via INTRAVENOUS
  Administered 2016-04-08 (×2): 25 ug via INTRAVENOUS

## 2016-04-08 MED ORDER — HEPARIN SOD (PORK) LOCK FLUSH 100 UNIT/ML IV SOLN
INTRAVENOUS | Status: AC | PRN
  Administered 2016-04-08: 500 [IU]

## 2016-04-08 MED ORDER — FENTANYL CITRATE (PF) 100 MCG/2ML IJ SOLN
INTRAMUSCULAR | Status: AC
  Filled 2016-04-08: qty 4

## 2016-04-08 MED ORDER — SODIUM CHLORIDE 0.9 % IV SOLN
INTRAVENOUS | Status: DC
  Administered 2016-04-08: 13:00:00 via INTRAVENOUS

## 2016-04-08 NOTE — Discharge Instructions (Signed)
Implanted Port Home Guide °An implanted port is a type of central line that is placed under the skin. Central lines are used to provide IV access when treatment or nutrition needs to be given through a person's veins. Implanted ports are used for long-term IV access. An implanted port may be placed because:  °· You need IV medicine that would be irritating to the small veins in your hands or arms.   °· You need long-term IV medicines, such as antibiotics.   °· You need IV nutrition for a long period.   °· You need frequent blood draws for lab tests.   °· You need dialysis.   °Implanted ports are usually placed in the chest area, but they can also be placed in the upper arm, the abdomen, or the leg. An implanted port has two main parts:  °· Reservoir. The reservoir is round and will appear as a small, raised area under your skin. The reservoir is the part where a needle is inserted to give medicines or draw blood.   °· Catheter. The catheter is a thin, flexible tube that extends from the reservoir. The catheter is placed into a large vein. Medicine that is inserted into the reservoir goes into the catheter and then into the vein.   °HOW WILL I CARE FOR MY INCISION SITE? °Do not get the incision site wet. Bathe or shower as directed by your health care provider.  °HOW IS MY PORT ACCESSED? °Special steps must be taken to access the port:  °· Before the port is accessed, a numbing cream can be placed on the skin. This helps numb the skin over the port site.   °· Your health care provider uses a sterile technique to access the port. °¨ Your health care provider must put on a mask and sterile gloves. °¨ The skin over your port is cleaned carefully with an antiseptic and allowed to dry. °¨ The port is gently pinched between sterile gloves, and a needle is inserted into the port. °· Only "non-coring" port needles should be used to access the port. Once the port is accessed, a blood return should be checked. This helps  ensure that the port is in the vein and is not clogged.   °· If your port needs to remain accessed for a constant infusion, a clear (transparent) bandage will be placed over the needle site. The bandage and needle will need to be changed every week, or as directed by your health care provider.   °· Keep the bandage covering the needle clean and dry. Do not get it wet. Follow your health care provider's instructions on how to take a shower or bath while the port is accessed.   °· If your port does not need to stay accessed, no bandage is needed over the port.   °WHAT IS FLUSHING? °Flushing helps keep the port from getting clogged. Follow your health care provider's instructions on how and when to flush the port. Ports are usually flushed with saline solution or a medicine called heparin. The need for flushing will depend on how the port is used.  °· If the port is used for intermittent medicines or blood draws, the port will need to be flushed:   °¨ After medicines have been given.   °¨ After blood has been drawn.   °¨ As part of routine maintenance.   °· If a constant infusion is running, the port may not need to be flushed.   °HOW LONG WILL MY PORT STAY IMPLANTED? °The port can stay in for as long as your health care   provider thinks it is needed. When it is time for the port to come out, surgery will be done to remove it. The procedure is similar to the one performed when the port was put in.  °WHEN SHOULD I SEEK IMMEDIATE MEDICAL CARE? °When you have an implanted port, you should seek immediate medical care if:  °· You notice a bad smell coming from the incision site.   °· You have swelling, redness, or drainage at the incision site.   °· You have more swelling or pain at the port site or the surrounding area.   °· You have a fever that is not controlled with medicine. °This information is not intended to replace advice given to you by your health care provider. Make sure you discuss any questions you have with  your health care provider. °Document Released: 03/11/2005 Document Revised: 12/30/2012 Document Reviewed: 11/16/2012 °Elsevier Interactive Patient Education © 2017 Else[employer] Inc[employer] °Implanted Port Insertion, Care After °Refer to this sheet in the next few weeks. These instructions provide you with information on caring for yourself after your procedure. Your health care provider may also give you more specific instructions. Your treatment has been planned according to current medical practices, but problems sometimes occur. Call your health care provider if you have any problems or questions after your procedure. °WHAT TO EXPECT AFTER THE PROCEDURE °After your procedure, it is typical to have the following:  °· Discomfort at the port insertion site. Ice packs to the area will help. °· Bruising on the skin over the port. This will subside in 3-4 days. °HOME CARE INSTRUCTIONS °· After your port is placed, you will get a manufacturer's information card. The card has information about your port. Keep this card with you at all times.   °· Know what kind of port you have. There are many types of ports available.   °· Wear a medical alert bracelet in case of an emergency. This can help alert health care workers that you have a port.   °· The port can stay in for as long as your health care provider believes it is necessary.   °· A home health care nurse may give medicines and take care of the port.   °· You or a family member can get special training and directions for giving medicine and taking care of the port at home.   °SEEK MEDICAL CARE IF:  °· Your port does not flush or you are unable to get a blood return.   °· You have a fever or chills. °SEEK IMMEDIATE MEDICAL CARE IF: °· You have new fluid or pus coming from your incision.   °· You notice a bad smell coming from your incision site.   °· You have swelling, pain, or more redness at the incision or port site.   °· You have chest pain or shortness of breath. °This  information is not intended to replace advice given to you by your health care provider. Make sure you discuss any questions you have with your health care provider. °Document Released: 12/30/2012 Document Revised: 03/16/2013 Document Reviewed: 12/30/2012 °Elsevier Interactive Patient Education © 2017 Else[employer] Inc[employer] °Moderate Conscious Sedation, Adult, Care After °These instructions provide you with information about caring for yourself after your procedure. Your health care provider may also give you more specific instructions. Your treatment has been planned according to current medical practices, but problems sometimes occur. Call your health care provider if you have any problems or questions after your procedure. °What can I expect after the procedure? °After your procedure, it is common: °·   To feel sleepy for several hours. °· To feel clumsy and have poor balance for several hours. °· To have poor judgment for several hours. °· To vomit if you eat too soon. °Follow these instructions at home: °For at least 24 hours after the procedure:  °· Do not: °¨ Participate in activities where you could fall or become injured. °¨ Drive. °¨ Use heavy machinery. °¨ Drink alcohol. °¨ Take sleeping pills or medicines that cause drowsiness. °¨ Make important decisions or sign legal documents. °¨ Take care of children on your own. °· Rest. °Eating and drinking °· Follow the diet recommended by your health care provider. °· If you vomit: °¨ Drink water, juice, or soup when you can drink without vomiting. °¨ Make sure you have little or no nausea before eating solid foods. °General instructions °· Have a responsible adult stay with you until you are awake and alert. °· Take over-the-counter and prescription medicines only as told by your health care provider. °· If you smoke, do not smoke without supervision. °· Keep all follow-up visits as told by your health care provider. This is important. °Contact a health care provider  if: °· You keep feeling nauseous or you keep vomiting. °· You feel light-headed. °· You develop a rash. °· You have a fever. °Get help right away if: °· You have trouble breathing. °This information is not intended to replace advice given to you by your health care provider. Make sure you discuss any questions you have with your health care provider. °Document Released: 12/30/2012 Document Revised: 08/14/2015 Document Reviewed: 07/01/2015 °Elsevier Interactive Patient Education © 2017 Elsevier Inc. ° °

## 2016-04-08 NOTE — H&P (Signed)
Referring Physician(s): Ennever,Peter R  Supervising Physician: [name]  Patient Status:  [facility] OP  Chief Complaint: "I'm here for a port a cath"   Subjective: Patient familiar to IR service from omental mass biopsy on 03/13/16. She has a history of recurrent metastatic colon cancer and presents again today for Port-A-Cath placement prior to planned chemotherapy. She currently denies fever, headache, chest pain, dyspnea, cough, abdominal/back pain, or abnormal bleeding. She has had some recent nausea and vomiting. Past Medical History:  Diagnosis Date  . 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 Colonic cancer ([place]) 03/09/2015  . History of chemotherapy    completed on 09/12/2015   . PONV (postoperative nausea and vomiting)   . Rectal mass 02/20/15   Rectal/sigmoid mass   Past Surgical History:  Procedure Laterality Date  . APPENDECTOMY  02/20/15   Abnormal appearing  . CESAREAN SECTION     x 2  . COLONOSCOPY N/A 10/09/2015   Procedure: COLONOSCOPY;  Surgeon: Leighton Ruff, MD;  Location: [facility] ENDOSCOPY;  Service: Endoscopy;  Laterality: N/A;  . COLOSTOMY  02/20/15   Sigmoid/rectal mass  . COLOSTOMY N/A 02/20/2015   Procedure: COLOSTOMY;  Surgeon: [name], MD;  Location: [place];  Service: General;  Laterality: N/A;  . COLOSTOMY REVERSAL N/A 11/15/2015   Procedure: COLOSTOMY REVERSAL;  Surgeon: [name], MD;  Location: [facility];  Service: General;  Laterality: N/A;  . EXPLORATORY LAPAROTOMY  02/20/15   Sigmoid/rectal mass  . FLEXIBLE SIGMOIDOSCOPY N/A 02/15/2015   Procedure: FLEXIBLE SIGMOIDOSCOPY;  Surgeon: [name], MD;  Location: [facility] ENDOSCOPY;  Service: Endoscopy;  Laterality: N/A;  . LAPAROSCOPIC LYSIS OF ADHESIONS N/A 11/15/2015   Procedure: LAPAROSCOPIC LYSIS OF ADHESIONS;  Surgeon: [name], MD;  Location: [facility];  Service: General;  Laterality: N/A;  . LAPAROSCOPY N/A 02/20/2015   Procedure:  LAPAROSCOPY DIAGNOSTIC;  Surgeon: [name], MD;  Location: [place];  Service: General;  Laterality: N/A;  . LAPAROSCOPY ABDOMEN DIAGNOSTIC  02/20/15   Converted to open - Sigmoid/rectal mass  . LAPAROTOMY N/A 02/20/2015   Procedure: EXPLORATORY LAPAROTOMY AND PARTIAL COLECTOMY;  Surgeon: [name], MD;  Location: [place];  Service: General;  Laterality: N/A;  . LOW ANTERIOR BOWEL RESECTION  02/20/15   Sigmoid/rectal mass  . OSTEOTOMY     reconstructed coccyx   . reconstructed nerve in ankle     . TONSILLECTOMY       Allergies: Bactrim [sulfamethoxazole-trimethoprim]; Oxaprozin; Penicillins; and Sulfonamide derivatives  Medications: Prior to Admission medications   Medication Sig Start Date End Date Taking? Authorizing Provider  acetaminophen (TYLENOL) 500 MG tablet Take 1,000 mg by mouth every 8 (eight) hours as needed.   Yes Historical Provider, MD  fluticasone (FLONASE) 50 MCG/ACT nasal spray Place into both nostrils 2 (two) times daily as needed for allergies or rhinitis.   Yes Historical Provider, MD  traMADol (ULTRAM) 50 MG tablet Take 1 tablet (50 mg total) by mouth every 6 (six) hours as needed. 03/15/16  Yes Volanda Napoleon, MD  fexofenadine (ALLEGRA) 180 MG tablet Take 180 mg by mouth daily as needed for allergies.     Historical Provider, MD  ibuprofen (ADVIL,MOTRIN) 200 MG tablet Take 400 mg by mouth every 6 (six) hours as needed for headache or mild pain.    Historical Provider, MD     Vital Signs: BP (!) 134/93 (BP Location: Right Arm)   Pulse 71   Temp 98.2 F (36.8  C) (Oral)   Resp 18   Ht 5\' 5"  (1.651 m)   Wt 148 lb (67.1 kg)   SpO2 100%   BMI 24.63 kg/m   Physical Exam  awake, alert. Chest clear to auscultation bilaterally. Heart with regular rate and rhythm. Abdomen soft, positive bowel sounds, nontender. Lower extremities with no edema. Imaging: No results found.  Labs:  CBC:  Recent Labs  02/20/16 0915 03/13/16 0724 04/03/16 0902  04/08/16 1228  WBC 6.8 8.7 8.5 8.1  HGB 14.0 15.0 13.5 12.2  HCT 40.6 41.9 39.5 36.1  PLT 279 350 347 357    COAGS:  Recent Labs  03/13/16 0724 04/08/16 1228  INR 0.94 0.99    BMP:  Recent Labs  11/02/15 0915 11/16/15 0447 02/20/16 0915 04/03/16 0902  NA 138 140 142 140  K 4.3 3.9 3.8 3.9  CL 106 110 105 105  CO2 27 25 28 26   GLUCOSE 84 116* 83 95  BUN 13 7 10 13   CALCIUM 9.1 8.3* 9.2 8.9  CREATININE 0.62 0.42* 0.6 0.7  GFRNONAA >60 >60  --   --   GFRAA >60 >60  --   --     LIVER FUNCTION TESTS:  Recent Labs  08/25/15 0820 10/10/15 1152 02/20/16 0915 04/03/16 0902  BILITOT 0.94 1.10 0.70 0.90  AST 20 22 22 21   ALT 21 21 13 16   ALKPHOS 52 46 51 62  PROT 7.0 7.0 6.8 7.4  ALBUMIN 4.1 4.2 4.0 4.0    Assessment and Plan:  Pt with history of recurrent metastatic colon cancer who presents  today for Port-A-Cath placement prior to planned chemotherapy.Risks and benefits discussed with the patient/family including, but not limited to bleeding, infection, pneumothorax, or fibrin sheath development and need for additional procedures. All of the patient's questions were answered, patient is agreeable to proceed. Consent signed and in chart.     Electronically Signed: D. Rowe Robert 04/08/2016, 2:03 PM   I spent a total of 20 minutes at the the patient's bedside AND on the patient's hospital floor or unit, greater than 50% of which was counseling/coordinating care for port a cath placement

## 2016-04-08 NOTE — Procedures (Signed)
R IJ Port cathter placement with US and fluoroscopy No complication No blood loss. See complete dictation in [facility] PACS.  

## 2016-04-09 ENCOUNTER — Other Ambulatory Visit: Payer: Self-pay

## 2016-04-09 ENCOUNTER — Ambulatory Visit (HOSPITAL_BASED_OUTPATIENT_CLINIC_OR_DEPARTMENT_OTHER): Payer: BLUE CROSS/BLUE SHIELD

## 2016-04-09 VITALS — BP 131/81 | HR 84 | Temp 97.9°F | Resp 17

## 2016-04-09 DIAGNOSIS — C187 Malignant neoplasm of sigmoid colon: Secondary | ICD-10-CM

## 2016-04-09 DIAGNOSIS — Z5111 Encounter for antineoplastic chemotherapy: Secondary | ICD-10-CM

## 2016-04-09 DIAGNOSIS — C772 Secondary and unspecified malignant neoplasm of intra-abdominal lymph nodes: Secondary | ICD-10-CM

## 2016-04-09 DIAGNOSIS — C189 Malignant neoplasm of colon, unspecified: Secondary | ICD-10-CM | POA: Diagnosis not present

## 2016-04-09 MED ORDER — IRINOTECAN HCL CHEMO INJECTION 100 MG/5ML
132.0000 mg/m2 | Freq: Once | INTRAVENOUS | Status: AC
  Administered 2016-04-09: 240 mg via INTRAVENOUS
  Filled 2016-04-09: qty 10

## 2016-04-09 MED ORDER — PALONOSETRON HCL INJECTION 0.25 MG/5ML
INTRAVENOUS | Status: AC
  Filled 2016-04-09: qty 5

## 2016-04-09 MED ORDER — DEXTROSE 5 % IV SOLN
INTRAVENOUS | Status: AC
  Administered 2016-04-09: 12:00:00 via INTRAVENOUS

## 2016-04-09 MED ORDER — DEXAMETHASONE 4 MG PO TABS: 8.0000 mg | ORAL_TABLET | Freq: Every day | ORAL | 0 refills | Status: DC

## 2016-04-09 MED ORDER — LIDOCAINE-PRILOCAINE 2.5-2.5 % EX CREA: 1.0000 "application " | TOPICAL_CREAM | CUTANEOUS | 3 refills | Status: DC | PRN

## 2016-04-09 MED ORDER — HEPARIN SOD (PORK) LOCK FLUSH 100 UNIT/ML IV SOLN
500.0000 [IU] | Freq: Once | INTRAVENOUS | Status: DC | PRN
  Filled 2016-04-09: qty 5

## 2016-04-09 MED ORDER — PROCHLORPERAZINE MALEATE 10 MG PO TABS: 10.0000 mg | ORAL_TABLET | Freq: Four times a day (QID) | ORAL | 1 refills | Status: DC | PRN

## 2016-04-09 MED ORDER — SODIUM CHLORIDE 0.9 % IV SOLN
10.0000 mg | Freq: Once | INTRAVENOUS | Status: DC
  Filled 2016-04-09: qty 1

## 2016-04-09 MED ORDER — DEXAMETHASONE SODIUM PHOSPHATE 10 MG/ML IJ SOLN
10.0000 mg | Freq: Once | INTRAMUSCULAR | Status: AC
  Administered 2016-04-09: 10 mg via INTRAVENOUS

## 2016-04-09 MED ORDER — ONDANSETRON HCL 8 MG PO TABS: 8.0000 mg | ORAL_TABLET | Freq: Two times a day (BID) | ORAL | 0 refills | Status: DC

## 2016-04-09 MED ORDER — LOPERAMIDE HCL 2 MG PO CAPS: 2.0000 mg | ORAL_CAPSULE | ORAL | 0 refills | Status: DC | PRN

## 2016-04-09 MED ORDER — SODIUM CHLORIDE 0.9% FLUSH
10.0000 mL | INTRAVENOUS | Status: DC | PRN
  Filled 2016-04-09: qty 10

## 2016-04-09 MED ORDER — OXALIPLATIN CHEMO INJECTION 100 MG/20ML
68.0000 mg/m2 | Freq: Once | INTRAVENOUS | Status: AC
  Administered 2016-04-09: 120 mg via INTRAVENOUS
  Filled 2016-04-09: qty 20

## 2016-04-09 MED ORDER — DEXAMETHASONE SODIUM PHOSPHATE 10 MG/ML IJ SOLN
INTRAMUSCULAR | Status: AC
  Filled 2016-04-09: qty 1

## 2016-04-09 MED ORDER — PALONOSETRON HCL INJECTION 0.25 MG/5ML
0.2500 mg | Freq: Once | INTRAVENOUS | Status: AC
  Administered 2016-04-09: 0.25 mg via INTRAVENOUS

## 2016-04-09 MED ORDER — SODIUM CHLORIDE 0.9 % IV SOLN
2560.0000 mg/m2 | INTRAVENOUS | Status: AC
  Administered 2016-04-09: 4550 mg via INTRAVENOUS
  Filled 2016-04-09: qty 91

## 2016-04-09 MED ORDER — LORAZEPAM 1 MG PO TABS: 1.0000 mg | ORAL_TABLET | Freq: Four times a day (QID) | ORAL | 0 refills | Status: DC | PRN

## 2016-04-09 MED ORDER — LEUCOVORIN CALCIUM INJECTION 350 MG
350.0000 mg | Freq: Once | INTRAVENOUS | Status: AC
  Administered 2016-04-09: 350 mg via INTRAVENOUS
  Filled 2016-04-09: qty 17.5

## 2016-04-09 MED FILL — LIDOCAINE-PRILOCAINE CREAM: 2.5-2.5 | 30 days supply | Qty: 30 | Fill #0

## 2016-04-09 MED FILL — LORazepam 1 MG TABS: 1 | 7 days supply | Qty: 30 | Fill #0

## 2016-04-09 MED FILL — DEXAMETHASONE 4 MG TABLET: 4 | 12 days supply | Qty: 24 | Fill #0

## 2016-04-09 MED FILL — PROCHLORPERAZINE 10 MG TAB: 10 | 7 days supply | Qty: 30 | Fill #0

## 2016-04-09 MED FILL — SM ANTI-DIARRHEAL 2 MG CAPL: 2 | 8 days supply | Qty: 96 | Fill #0

## 2016-04-09 MED FILL — ONDANSETRON HCL 8 MG TABLET: 8 | 10 days supply | Qty: 20 | Fill #0

## 2016-04-09 NOTE — Patient Instructions (Addendum)
[place] Discharge Instructions for Patients Receiving Chemotherapy  Today you received the following chemotherapy agents Irinotecan, Oxaliplatin, Leucovorin, Fluorouracil  ** DO NOT TAKE ZOFRAN UNTIL Friday**  To help prevent nausea and vomiting after your treatment, we encourage you to take your nausea medication as prescribed by MD. Do not take zofran for 3 days after treatment if you received Aloxi.   If you develop nausea and vomiting that is not controlled by your nausea medication, call the clinic.   BELOW ARE SYMPTOMS THAT SHOULD BE REPORTED IMMEDIATELY:  *FEVER GREATER THAN 100.5 F  *CHILLS WITH OR WITHOUT FEVER  NAUSEA AND VOMITING THAT IS NOT CONTROLLED WITH YOUR NAUSEA MEDICATION  *UNUSUAL SHORTNESS OF BREATH  *UNUSUAL BRUISING OR BLEEDING  TENDERNESS IN MOUTH AND THROAT WITH OR WITHOUT PRESENCE OF ULCERS  *URINARY PROBLEMS  *BOWEL PROBLEMS  UNUSUAL RASH Items with * indicate a potential emergency and should be followed up as soon as possible.  Feel free to call the clinic you have any questions or concerns. The clinic phone number is [phone].  Please show the [facility] at check-in to the Emergency Department and triage nurse.  Fluorouracil, 5-FU injection What is this medicine? FLUOROURACIL, 5-FU (flure oh YOOR a sil) is a chemotherapy drug. It slows the growth of cancer cells. This medicine is used to treat many types of cancer like breast cancer, colon or rectal cancer, pancreatic cancer, and stomach cancer. This medicine may be used for other purposes; ask your health care provider or pharmacist if you have questions. COMMON BRAND NAME(S): Adrucil What should I tell my health care provider before I take this medicine? They need to know if you have any of these conditions: -blood disorders -dihydropyrimidine dehydrogenase (DPD) deficiency -infection (especially a virus infection such as chickenpox, cold sores, or  herpes) -kidney disease -liver disease -malnourished, poor nutrition -recent or ongoing radiation therapy -an unusual or allergic reaction to fluorouracil, other chemotherapy, other medicines, foods, dyes, or preservatives -pregnant or trying to get pregnant -breast-feeding How should I use this medicine? This drug is given as an infusion or injection into a vein. It is administered in a hospital or clinic by a specially trained health care professional. Talk to your pediatrician regarding the use of this medicine in children. Special care may be needed. Overdosage: If you think you have taken too much of this medicine contact a poison control center or emergency room at once. NOTE: This medicine is only for you. Do not share this medicine with others. What if I miss a dose? It is important not to miss your dose. Call your doctor or health care professional if you are unable to keep an appointment. What may interact with this medicine? -allopurinol -cimetidine -dapsone -digoxin -hydroxyurea -leucovorin -levamisole -medicines for seizures like ethotoin, fosphenytoin, phenytoin -medicines to increase blood counts like filgrastim, pegfilgrastim, sargramostim -medicines that treat or prevent blood clots like warfarin, enoxaparin, and dalteparin -methotrexate -metronidazole -pyrimethamine -some other chemotherapy drugs like busulfan, cisplatin, estramustine, vinblastine -trimethoprim -trimetrexate -vaccines Talk to your doctor or health care professional before taking any of these medicines: -acetaminophen -aspirin -ibuprofen -ketoprofen -naproxen This list may not describe all possible interactions. Give your health care provider a list of all the medicines, herbs, non-prescription drugs, or dietary supplements you use. Also tell them if you smoke, drink alcohol, or use illegal drugs. Some items may interact with your medicine. What should I watch for while using this  medicine? Visit your doctor for checks  on your progress. This drug may make you feel generally unwell. This is not uncommon, as chemotherapy can affect healthy cells as well as cancer cells. Report any side effects. Continue your course of treatment even though you feel ill unless your doctor tells you to stop. In some cases, you may be given additional medicines to help with side effects. Follow all directions for their use. Call your doctor or health care professional for advice if you get a fever, chills or sore throat, or other symptoms of a cold or flu. Do not treat yourself. This drug decreases your body's ability to fight infections. Try to avoid being around people who are sick. This medicine may increase your risk to bruise or bleed. Call your doctor or health care professional if you notice any unusual bleeding. Be careful brushing and flossing your teeth or using a toothpick because you may get an infection or bleed more easily. If you have any dental work done, tell your dentist you are receiving this medicine. Avoid taking products that contain aspirin, acetaminophen, ibuprofen, naproxen, or ketoprofen unless instructed by your doctor. These medicines may hide a fever. Do not become pregnant while taking this medicine. Women should inform their doctor if they wish to become pregnant or think they might be pregnant. There is a potential for serious side effects to an unborn child. Talk to your health care professional or pharmacist for more information. Do not breast-feed an infant while taking this medicine. Men should inform their doctor if they wish to father a child. This medicine may lower sperm counts. Do not treat diarrhea with over the counter products. Contact your doctor if you have diarrhea that lasts more than 2 days or if it is severe and watery. This medicine can make you more sensitive to the sun. Keep out of the sun. If you cannot avoid being in the sun, wear protective clothing  and use sunscreen. Do not use sun lamps or tanning beds/booths. What side effects may I notice from receiving this medicine? Side effects that you should report to your doctor or health care professional as soon as possible: -allergic reactions like skin rash, itching or hives, swelling of the face, lips, or tongue -low blood counts - this medicine may decrease the number of white blood cells, red blood cells and platelets. You may be at increased risk for infections and bleeding. -signs of infection - fever or chills, cough, sore throat, pain or difficulty passing urine -signs of decreased platelets or bleeding - bruising, pinpoint red spots on the skin, black, tarry stools, blood in the urine -signs of decreased red blood cells - unusually weak or tired, fainting spells, lightheadedness -breathing problems -changes in vision -chest pain -mouth sores -nausea and vomiting -pain, swelling, redness at site where injected -pain, tingling, numbness in the hands or feet -redness, swelling, or sores on hands or feet -stomach pain -unusual bleeding Side effects that usually do not require medical attention (report to your doctor or health care professional if they continue or are bothersome): -changes in finger or toe nails -diarrhea -dry or itchy skin -hair loss -headache -loss of appetite -sensitivity of eyes to the light -stomach upset -unusually teary eyes This list may not describe all possible side effects. Call your doctor for medical advice about side effects. You may report side effects to FDA at 1-800-FDA-1088. Where should I keep my medicine? This drug is given in a hospital or clinic and will not be stored at home. NOTE: This  sheet is a summary. It may not cover all possible information. If you have questions about this medicine, talk to your doctor, pharmacist, or health care provider.  2017 Elsevier/Gold Standard (2007-07-15 13:53:16)   Leucovorin injection What is this  medicine? LEUCOVORIN (loo koe VOR in) is used to prevent or treat the harmful effects of some medicines. This medicine is used to treat anemia caused by a low amount of folic acid in the body. It is also used with 5-fluorouracil (5-FU) to treat colon cancer. This medicine may be used for other purposes; ask your health care provider or pharmacist if you have questions. What should I tell my health care provider before I take this medicine? They need to know if you have any of these conditions: -anemia from low levels of vitamin B-12 in the blood -an unusual or allergic reaction to leucovorin, folic acid, other medicines, foods, dyes, or preservatives -pregnant or trying to get pregnant -breast-feeding How should I use this medicine? This medicine is for injection into a muscle or into a vein. It is given by a health care professional in a hospital or clinic setting. Talk to your pediatrician regarding the use of this medicine in children. Special care may be needed. Overdosage: If you think you have taken too much of this medicine contact a poison control center or emergency room at once. NOTE: This medicine is only for you. Do not share this medicine with others. What if I miss a dose? This does not apply. What may interact with this medicine? -capecitabine -fluorouracil -phenobarbital -phenytoin -primidone -trimethoprim-sulfamethoxazole This list may not describe all possible interactions. Give your health care provider a list of all the medicines, herbs, non-prescription drugs, or dietary supplements you use. Also tell them if you smoke, drink alcohol, or use illegal drugs. Some items may interact with your medicine. What should I watch for while using this medicine? Your condition will be monitored carefully while you are receiving this medicine. This medicine may increase the side effects of 5-fluorouracil, 5-FU. Tell your doctor or health care professional if you have diarrhea or  mouth sores that do not get better or that get worse. What side effects may I notice from receiving this medicine? Side effects that you should report to your doctor or health care professional as soon as possible: -allergic reactions like skin rash, itching or hives, swelling of the face, lips, or tongue -breathing problems -fever, infection -mouth sores -unusual bleeding or bruising -unusually weak or tired Side effects that usually do not require medical attention (report to your doctor or health care professional if they continue or are bothersome): -constipation or diarrhea -loss of appetite -nausea, vomiting This list may not describe all possible side effects. Call your doctor for medical advice about side effects. You may report side effects to FDA at 1-800-FDA-1088. Where should I keep my medicine? This drug is given in a hospital or clinic and will not be stored at home. NOTE: This sheet is a summary. It may not cover all possible information. If you have questions about this medicine, talk to your doctor, pharmacist, or health care provider.  2017 Elsevier/Gold Standard (2007-09-15 16:50:29)   Oxaliplatin Injection What is this medicine? OXALIPLATIN (ox AL i PLA tin) is a chemotherapy drug. It targets fast dividing cells, like cancer cells, and causes these cells to die. This medicine is used to treat cancers of the colon and rectum, and many other cancers. This medicine may be used for other purposes; ask your  health care provider or pharmacist if you have questions. COMMON BRAND NAME(S): Eloxatin What should I tell my health care provider before I take this medicine? They need to know if you have any of these conditions: -kidney disease -an unusual or allergic reaction to oxaliplatin, other chemotherapy, other medicines, foods, dyes, or preservatives -pregnant or trying to get pregnant -breast-feeding How should I use this medicine? This drug is given as an infusion into  a vein. It is administered in a hospital or clinic by a specially trained health care professional. Talk to your pediatrician regarding the use of this medicine in children. Special care may be needed. Overdosage: If you think you have taken too much of this medicine contact a poison control center or emergency room at once. NOTE: This medicine is only for you. Do not share this medicine with others. What if I miss a dose? It is important not to miss a dose. Call your doctor or health care professional if you are unable to keep an appointment. What may interact with this medicine? -medicines to increase blood counts like filgrastim, pegfilgrastim, sargramostim -probenecid -some antibiotics like amikacin, gentamicin, neomycin, polymyxin B, streptomycin, tobramycin -zalcitabine Talk to your doctor or health care professional before taking any of these medicines: -acetaminophen -aspirin -ibuprofen -ketoprofen -naproxen This list may not describe all possible interactions. Give your health care provider a list of all the medicines, herbs, non-prescription drugs, or dietary supplements you use. Also tell them if you smoke, drink alcohol, or use illegal drugs. Some items may interact with your medicine. What should I watch for while using this medicine? Your condition will be monitored carefully while you are receiving this medicine. You will need important blood work done while you are taking this medicine. This medicine can make you more sensitive to cold. Do not drink cold drinks or use ice. Cover exposed skin before coming in contact with cold temperatures or cold objects. When out in cold weather wear warm clothing and cover your mouth and nose to warm the air that goes into your lungs. Tell your doctor if you get sensitive to the cold. This drug may make you feel generally unwell. This is not uncommon, as chemotherapy can affect healthy cells as well as cancer cells. Report any side effects.  Continue your course of treatment even though you feel ill unless your doctor tells you to stop. In some cases, you may be given additional medicines to help with side effects. Follow all directions for their use. Call your doctor or health care professional for advice if you get a fever, chills or sore throat, or other symptoms of a cold or flu. Do not treat yourself. This drug decreases your body's ability to fight infections. Try to avoid being around people who are sick. This medicine may increase your risk to bruise or bleed. Call your doctor or health care professional if you notice any unusual bleeding. Be careful brushing and flossing your teeth or using a toothpick because you may get an infection or bleed more easily. If you have any dental work done, tell your dentist you are receiving this medicine. Avoid taking products that contain aspirin, acetaminophen, ibuprofen, naproxen, or ketoprofen unless instructed by your doctor. These medicines may hide a fever. Do not become pregnant while taking this medicine. Women should inform their doctor if they wish to become pregnant or think they might be pregnant. There is a potential for serious side effects to an unborn child. Talk to your health care  professional or pharmacist for more information. Do not breast-feed an infant while taking this medicine. Call your doctor or health care professional if you get diarrhea. Do not treat yourself. What side effects may I notice from receiving this medicine? Side effects that you should report to your doctor or health care professional as soon as possible: -allergic reactions like skin rash, itching or hives, swelling of the face, lips, or tongue -low blood counts - This drug may decrease the number of white blood cells, red blood cells and platelets. You may be at increased risk for infections and bleeding. -signs of infection - fever or chills, cough, sore throat, pain or difficulty passing urine -signs  of decreased platelets or bleeding - bruising, pinpoint red spots on the skin, black, tarry stools, nosebleeds -signs of decreased red blood cells - unusually weak or tired, fainting spells, lightheadedness -breathing problems -chest pain, pressure -cough -diarrhea -jaw tightness -mouth sores -nausea and vomiting -pain, swelling, redness or irritation at the injection site -pain, tingling, numbness in the hands or feet -problems with balance, talking, walking -redness, blistering, peeling or loosening of the skin, including inside the mouth -trouble passing urine or change in the amount of urine Side effects that usually do not require medical attention (report to your doctor or health care professional if they continue or are bothersome): -changes in vision -constipation -hair loss -loss of appetite -metallic taste in the mouth or changes in taste -stomach pain This list may not describe all possible side effects. Call your doctor for medical advice about side effects. You may report side effects to FDA at 1-800-FDA-1088. Where should I keep my medicine? This drug is given in a hospital or clinic and will not be stored at home. NOTE: This sheet is a summary. It may not cover all possible information. If you have questions about this medicine, talk to your doctor, pharmacist, or health care provider.  2017 Elsevier/Gold Standard (2007-10-06 17:22:47)   Irinotecan injection What is this medicine? IRINOTECAN (ir in oh TEE kan ) is a chemotherapy drug. It is used to treat colon and rectal cancer. This medicine may be used for other purposes; ask your health care provider or pharmacist if you have questions. COMMON BRAND NAME(S): Camptosar What should I tell my health care provider before I take this medicine? They need to know if you have any of these conditions: -blood disorders -dehydration -diarrhea -infection (especially a virus infection such as chickenpox, cold sores, or  herpes) -liver disease -low blood counts, like low white cell, platelet, or red cell counts -recent or ongoing radiation therapy -an unusual or allergic reaction to irinotecan, sorbitol, other chemotherapy, other medicines, foods, dyes, or preservatives -pregnant or trying to get pregnant -breast-feeding How should I use this medicine? This drug is given as an infusion into a vein. It is administered in a hospital or clinic by a specially trained health care professional. Talk to your pediatrician regarding the use of this medicine in children. Special care may be needed. Overdosage: If you think you have taken too much of this medicine contact a poison control center or emergency room at once. NOTE: This medicine is only for you. Do not share this medicine with others. What if I miss a dose? It is important not to miss your dose. Call your doctor or health care professional if you are unable to keep an appointment. What may interact with this medicine? Do not take this medicine with any of the following medications: -atazanavir -certain  medicines for fungal infections like itraconazole and ketoconazole -St. John's Wort This medicine may also interact with the following medications: -dexamethasone -diuretics -laxatives -medicines for seizures like carbamazepine, mephobarbital, phenobarbital, phenytoin, primidone -medicines to increase blood counts like filgrastim, pegfilgrastim, sargramostim -prochlorperazine -vaccines This list may not describe all possible interactions. Give your health care provider a list of all the medicines, herbs, non-prescription drugs, or dietary supplements you use. Also tell them if you smoke, drink alcohol, or use illegal drugs. Some items may interact with your medicine. What should I watch for while using this medicine? Your condition will be monitored carefully while you are receiving this medicine. You will need important blood work done while you are  taking this medicine. This drug may make you feel generally unwell. This is not uncommon, as chemotherapy can affect healthy cells as well as cancer cells. Report any side effects. Continue your course of treatment even though you feel ill unless your doctor tells you to stop. In some cases, you may be given additional medicines to help with side effects. Follow all directions for their use. You may get drowsy or dizzy. Do not drive, use machinery, or do anything that needs mental alertness until you know how this medicine affects you. Do not stand or sit up quickly, especially if you are an older patient. This reduces the risk of dizzy or fainting spells. Call your doctor or health care professional for advice if you get a fever, chills or sore throat, or other symptoms of a cold or flu. Do not treat yourself. This drug decreases your body's ability to fight infections. Try to avoid being around people who are sick. This medicine may increase your risk to bruise or bleed. Call your doctor or health care professional if you notice any unusual bleeding. Be careful brushing and flossing your teeth or using a toothpick because you may get an infection or bleed more easily. If you have any dental work done, tell your dentist you are receiving this medicine. Avoid taking products that contain aspirin, acetaminophen, ibuprofen, naproxen, or ketoprofen unless instructed by your doctor. These medicines may hide a fever. Do not become pregnant while taking this medicine. Women should inform their doctor if they wish to become pregnant or think they might be pregnant. There is a potential for serious side effects to an unborn child. Talk to your health care professional or pharmacist for more information. Do not breast-feed an infant while taking this medicine. What side effects may I notice from receiving this medicine? Side effects that you should report to your doctor or health care professional as soon as  possible: -allergic reactions like skin rash, itching or hives, swelling of the face, lips, or tongue -low blood counts - this medicine may decrease the number of white blood cells, red blood cells and platelets. You may be at increased risk for infections and bleeding. -signs of infection - fever or chills, cough, sore throat, pain or difficulty passing urine -signs of decreased platelets or bleeding - bruising, pinpoint red spots on the skin, black, tarry stools, blood in the urine -signs of decreased red blood cells - unusually weak or tired, fainting spells, lightheadedness -breathing problems -chest pain -diarrhea -feeling faint or lightheaded, falls -flushing, runny nose, sweating during infusion -mouth sores or pain -pain, swelling, redness or irritation where injected -pain, swelling, warmth in the leg -pain, tingling, numbness in the hands or feet -problems with balance, talking, walking -stomach cramps, pain -trouble passing urine or change in  the amount of urine -vomiting as to be unable to hold down drinks or food -yellowing of the eyes or skin Side effects that usually do not require medical attention (report to your doctor or health care professional if they continue or are bothersome): -constipation -hair loss -headache -loss of appetite -nausea, vomiting -stomach upset This list may not describe all possible side effects. Call your doctor for medical advice about side effects. You may report side effects to FDA at 1-800-FDA-1088. Where should I keep my medicine? This drug is given in a hospital or clinic and will not be stored at home. NOTE: This sheet is a summary. It may not cover all possible information. If you have questions about this medicine, talk to your doctor, pharmacist, or health care provider.  2017 Elsevier/Gold Standard (2012-09-07 16:29:32)

## 2016-04-09 NOTE — Progress Notes (Unsigned)
[facility] Follow up:    No PCP Per Patient No address on file   DIAGNOSIS: Colonic cancer [facility])   Staging form: Colon and Rectum, AJCC 7th Edition   - Clinical stage from 02/20/2015: Stage IIB (T4a, N0, M0) - Unsigned  SUMMARY OF ONCOLOGIC HISTORY:  No history exists.    CURRENT THERAPY:  INTERVAL HISTORY: Carla Mills 46 y.o. female returns for    Patient Active Problem List   Diagnosis Date Noted  . Colonic cancer ([name]) 03/09/2015    Priority: High  . 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 Colonic diverticular abscess 02/10/2015  . Constipation 02/10/2015  . Anemia, iron deficiency 02/10/2015  . Liver lesion 02/10/2015  . ACUTE PHARYNGITIS 12/23/2009  . VIRAL URI 12/23/2009    is allergic to bactrim [sulfamethoxazole-trimethoprim]; oxaprozin; penicillins; and sulfonamide derivatives.  MEDICAL HISTORY: Past Medical History:  Diagnosis Date  . 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 Colonic cancer ([name]) 03/09/2015  . History of chemotherapy    completed on 09/12/2015   . PONV (postoperative nausea and vomiting)   . Rectal mass 02/20/15   Rectal/sigmoid mass    SURGICAL HISTORY: Past Surgical History:  Procedure Laterality Date  . APPENDECTOMY  02/20/15   Abnormal appearing  . CESAREAN SECTION     x 2  . COLONOSCOPY N/A 10/09/2015   Procedure: COLONOSCOPY;  Surgeon: Leighton Ruff, MD;  Location: [facility] ENDOSCOPY;  Service: Endoscopy;  Laterality: N/A;  . COLOSTOMY  02/20/15   Sigmoid/rectal mass  . COLOSTOMY N/A 02/20/2015   Procedure: COLOSTOMY;  Surgeon: [name], MD;  Location: [place];  Service: General;  Laterality: N/A;  . COLOSTOMY REVERSAL N/A 11/15/2015   Procedure: COLOSTOMY REVERSAL;  Surgeon: [name], MD;  Location: [facility] ORS;  Service: General;  Laterality: N/A;  .  EXPLORATORY LAPAROTOMY  02/20/15   Sigmoid/rectal mass  . FLEXIBLE SIGMOIDOSCOPY N/A 02/15/2015   Procedure: FLEXIBLE SIGMOIDOSCOPY;  Surgeon: [name], MD;  Location: [facility] ENDOSCOPY;  Service: Endoscopy;  Laterality: N/A;  . IR GENERIC HISTORICAL  04/08/2016   IR FLUORO GUIDE PORT INSERTION RIGHT 04/08/2016 [name], MD [facility]-INTERV RAD  . IR GENERIC HISTORICAL  04/08/2016   IR US GUIDE VASC ACCESS RIGHT 04/08/2016 [name], MD [facility]  . LAPAROSCOPIC LYSIS OF ADHESIONS N/A 11/15/2015   Procedure: LAPAROSCOPIC LYSIS OF ADHESIONS;  Surgeon: [name], MD;  Location: [facility] ORS;  Service: General;  Laterality: N/A;  . LAPAROSCOPY N/A 02/20/2015   Procedure: LAPAROSCOPY DIAGNOSTIC;  Surgeon: [name], MD;  Location: [place];  Service: General;  Laterality: N/A;  . LAPAROSCOPY ABDOMEN DIAGNOSTIC  02/20/15   Converted to open - Sigmoid/rectal mass  . LAPAROTOMY N/A 02/20/2015   Procedure: EXPLORATORY LAPAROTOMY AND PARTIAL COLECTOMY;  Surgeon: [name], MD;  Location: [place];  Service: General;  Laterality: N/A;  . LOW ANTERIOR BOWEL RESECTION  02/20/15   Sigmoid/rectal mass  . OSTEOTOMY     reconstructed coccyx   . reconstructed nerve in ankle     . TONSILLECTOMY      SOCIAL HISTORY: Social History   Social History  . Marital status: Married    Spouse name: N/A  . Number of children: N/A  . Years of education: N/A   Occupational History  . Not on file.   Social History Main Topics  . Smoking status: Never Smoker  .  Smokeless tobacco: Never Used  . Alcohol use 0.0 oz/week     Comment: socially   . Drug use: No  . Sexual activity: Not on file   Other Topics Concern  . Not on file   Social History Narrative  . No narrative on file    FAMILY HISTORY: No family history on file.  Review of Systems - Oncology    PHYSICAL EXAMINATION  ECOG PERFORMANCE STATUS: {CHL ONC ECOG [id number]  Vitals:   04/09/16 0901  BP: 131/81  Pulse: 84   Resp: 17  Temp: 97.9 F (36.6 C)    Physical Exam  LABORATORY DATA:  CBC    Component Value Date/Time   WBC 8.1 04/08/2016 1228   RBC 4.25 04/08/2016 1228   HGB 12.2 04/08/2016 1228   HGB 13.5 04/03/2016 0902   HCT 36.1 04/08/2016 1228   HCT 39.5 04/03/2016 0902   PLT 357 04/08/2016 1228   PLT 347 04/03/2016 0902   MCV 84.9 04/08/2016 1228   MCV 87 04/03/2016 0902   MCH 28.7 04/08/2016 1228   MCHC 33.8 04/08/2016 1228   RDW 12.3 04/08/2016 1228   RDW 12.0 04/03/2016 0902   LYMPHSABS 1.9 04/08/2016 1228   LYMPHSABS 1.4 04/03/2016 0902   MONOABS 0.4 04/08/2016 1228   EOSABS 0.6 04/08/2016 1228   EOSABS 0.6 (H) 04/03/2016 0902   BASOSABS 0.0 04/08/2016 1228   BASOSABS 0.0 04/03/2016 0902    CMP     Component Value Date/Time   NA 140 04/03/2016 0902   NA 141 08/25/2015 0820   K 3.9 04/03/2016 0902   K 4.7 08/25/2015 0820   CL 105 04/03/2016 0902   CO2 26 04/03/2016 0902   CO2 23 08/25/2015 0820   GLUCOSE 95 04/03/2016 0902   BUN 13 04/03/2016 0902   BUN 16.9 08/25/2015 0820   CREATININE 0.7 04/03/2016 0902   CREATININE 0.8 08/25/2015 0820   CALCIUM 8.9 04/03/2016 0902   CALCIUM 9.3 08/25/2015 0820   PROT 7.4 04/03/2016 0902   PROT 7.0 08/25/2015 0820   ALBUMIN 4.0 04/03/2016 0902   ALBUMIN 4.1 08/25/2015 0820   AST 21 04/03/2016 0902   AST 20 08/25/2015 0820   ALT 16 04/03/2016 0902   ALT 21 08/25/2015 0820   ALKPHOS 62 04/03/2016 0902   ALKPHOS 52 08/25/2015 0820   BILITOT 0.90 04/03/2016 0902   BILITOT 0.94 08/25/2015 0820   GFRNONAA >60 11/16/2015 0447   GFRAA >60 11/16/2015 0447       PENDING LABS:   RADIOGRAPHIC STUDIES:  Ir US Guide Vasc Access Right  Result Date: 04/08/2016 CLINICAL DATA:  Metastatic colon carcinoma, needs durable venous access for chemotherapy regimen EXAM: TUNNELED PORT CATHETER PLACEMENT WITH ULTRASOUND AND FLUOROSCOPIC GUIDANCE FLUOROSCOPY TIME:  0.2 minute (20 uGym2 DAP) ANESTHESIA/SEDATION: Intravenous Fentanyl  and Versed were administered as conscious sedation during continuous monitoring of the patient's level of consciousness and physiological / cardiorespiratory status by the radiology RN, with a total moderate sedation time of 18 minutes. TECHNIQUE: The procedure, risks, benefits, and alternatives were explained to the patient. Questions regarding the procedure were encouraged and answered. The patient understands and consents to the procedure. As antibiotic prophylaxis, cefazolin 2 g was ordered pre-procedure and administered intravenously within one hour of incision. Patency of the right IJ vein was confirmed with ultrasound with image documentation. An appropriate skin site was determined. Skin site was marked. Region was prepped using maximum barrier technique including cap and mask, sterile gown, sterile gloves, large sterile  sheet, and Chlorhexidine as cutaneous antisepsis. The region was infiltrated locally with 1% lidocaine. Under real-time ultrasound guidance, the right IJ vein was accessed with a 21 gauge micropuncture needle; the needle tip within the vein was confirmed with ultrasound image documentation. Needle was exchanged over a 018 guidewire for transitional dilator which allowed passage of the [facility] wire into the IVC. Over this, the transitional dilator was exchanged for a 5 [place] MPA catheter. A small incision was made on the right anterior chest wall and a subcutaneous pocket fashioned. The power-injectable port was positioned and its catheter tunneled to the right IJ dermatotomy site. The MPA catheter was exchanged over an Amplatz wire for a peel-away sheath, through which the port catheter, which had been trimmed to the appropriate length, was advanced and positioned under fluoroscopy with its tip at the cavoatrial junction. Spot chest radiograph confirms good catheter position and no pneumothorax. The pocket was closed with deep interrupted and subcuticular continuous 3-0 Monocryl sutures. The  port was flushed per protocol. The incisions were covered with Dermabond then covered with a sterile dressing. COMPLICATIONS: COMPLICATIONS None immediate IMPRESSION: Technically successful right IJ power-injectable port catheter placement. Ready for routine use. Electronically Signed   By: Lucrezia Europe M.D.   On: 04/08/2016 16:49   Ir Fluoro Guide Port Insertion Right  Result Date: 04/08/2016 CLINICAL DATA:  Metastatic colon carcinoma, needs durable venous access for chemotherapy regimen EXAM: TUNNELED PORT CATHETER PLACEMENT WITH ULTRASOUND AND FLUOROSCOPIC GUIDANCE FLUOROSCOPY TIME:  0.2 minute (20 uGym2 DAP) ANESTHESIA/SEDATION: Intravenous Fentanyl and Versed were administered as conscious sedation during continuous monitoring of the patient's level of consciousness and physiological / cardiorespiratory status by the radiology RN, with a total moderate sedation time of 18 minutes. TECHNIQUE: The procedure, risks, benefits, and alternatives were explained to the patient. Questions regarding the procedure were encouraged and answered. The patient understands and consents to the procedure. As antibiotic prophylaxis, cefazolin 2 g was ordered pre-procedure and administered intravenously within one hour of incision. Patency of the right IJ vein was confirmed with ultrasound with image documentation. An appropriate skin site was determined. Skin site was marked. Region was prepped using maximum barrier technique including cap and mask, sterile gown, sterile gloves, large sterile sheet, and Chlorhexidine as cutaneous antisepsis. The region was infiltrated locally with 1% lidocaine. Under real-time ultrasound guidance, the right IJ vein was accessed with a 21 gauge micropuncture needle; the needle tip within the vein was confirmed with ultrasound image documentation. Needle was exchanged over a 018 guidewire for transitional dilator which allowed passage of the [facility] wire into the IVC. Over this, the transitional  dilator was exchanged for a 5 [place] MPA catheter. A small incision was made on the right anterior chest wall and a subcutaneous pocket fashioned. The power-injectable port was positioned and its catheter tunneled to the right IJ dermatotomy site. The MPA catheter was exchanged over an Amplatz wire for a peel-away sheath, through which the port catheter, which had been trimmed to the appropriate length, was advanced and positioned under fluoroscopy with its tip at the cavoatrial junction. Spot chest radiograph confirms good catheter position and no pneumothorax. The pocket was closed with deep interrupted and subcuticular continuous 3-0 Monocryl sutures. The port was flushed per protocol. The incisions were covered with Dermabond then covered with a sterile dressing. COMPLICATIONS: COMPLICATIONS None immediate IMPRESSION: Technically successful right IJ power-injectable port catheter placement. Ready for routine use. Electronically Signed   By: Lucrezia Europe M.D.   On: 04/08/2016  16:49     PATHOLOGY:     ASSESSMENT and THERAPY PLAN:   No problem-specific Assessment & Plan notes found for this encounter.   No orders of the defined types were placed in this encounter.   All questions were answered. The patient knows to call the clinic with any problems, questions or concerns. We can certainly see the patient much sooner if necessary. This note was electronically signed. Volanda Napoleon, MD 04/09/2016

## 2016-04-11 ENCOUNTER — Ambulatory Visit (HOSPITAL_BASED_OUTPATIENT_CLINIC_OR_DEPARTMENT_OTHER): Payer: BLUE CROSS/BLUE SHIELD

## 2016-04-11 VITALS — BP 107/65 | HR 66 | Resp 18

## 2016-04-11 DIAGNOSIS — C772 Secondary and unspecified malignant neoplasm of intra-abdominal lymph nodes: Secondary | ICD-10-CM | POA: Diagnosis not present

## 2016-04-11 DIAGNOSIS — C187 Malignant neoplasm of sigmoid colon: Secondary | ICD-10-CM | POA: Diagnosis not present

## 2016-04-11 MED ORDER — HEPARIN SOD (PORK) LOCK FLUSH 100 UNIT/ML IV SOLN
500.0000 [IU] | Freq: Once | INTRAVENOUS | Status: AC
  Administered 2016-04-11: 500 [IU] via INTRAVENOUS
  Filled 2016-04-11: qty 5

## 2016-04-11 MED ORDER — SODIUM CHLORIDE 0.9% FLUSH
10.0000 mL | INTRAVENOUS | Status: DC | PRN
  Administered 2016-04-11: 10 mL via INTRAVENOUS
  Filled 2016-04-11: qty 10

## 2016-04-11 MED ORDER — SODIUM CHLORIDE 0.9% FLUSH
10.0000 mL | INTRAVENOUS | Status: DC | PRN
  Filled 2016-04-11: qty 10

## 2016-04-11 MED ORDER — HEPARIN SOD (PORK) LOCK FLUSH 100 UNIT/ML IV SOLN
500.0000 [IU] | Freq: Once | INTRAVENOUS | Status: DC | PRN
  Filled 2016-04-11: qty 5

## 2016-04-11 NOTE — Progress Notes (Signed)
CHEMO FOLLOW UP DONE IN CLINIC TODAY - yesterday was half a snow day - patient came today for pump discontinue. She has been feeling well at home and had no concerns to report today.

## 2016-04-12 ENCOUNTER — Ambulatory Visit (HOSPITAL_COMMUNITY)
Admission: RE | Admit: 2016-04-12 | Discharge: 2016-04-12 | Disposition: A | Discharge status: Home / Self Care | Payer: BLUE CROSS/BLUE SHIELD | Source: Ambulatory Visit | Attending: Hematology & Oncology | Admitting: Hematology & Oncology

## 2016-04-12 DIAGNOSIS — C786 Secondary malignant neoplasm of retroperitoneum and peritoneum: Secondary | ICD-10-CM | POA: Diagnosis not present

## 2016-04-12 DIAGNOSIS — C187 Malignant neoplasm of sigmoid colon: Secondary | ICD-10-CM

## 2016-04-12 DIAGNOSIS — R911 Solitary pulmonary nodule: Secondary | ICD-10-CM | POA: Insufficient documentation

## 2016-04-12 DIAGNOSIS — C772 Secondary and unspecified malignant neoplasm of intra-abdominal lymph nodes: Secondary | ICD-10-CM | POA: Insufficient documentation

## 2016-04-12 LAB — GLUCOSE, CAPILLARY: Glucose-Capillary: 86 mg/dL (ref 65–99)

## 2016-04-12 MED ORDER — FLUDEOXYGLUCOSE F - 18 (FDG) INJECTION: 7.2000 | Freq: Once | INTRAVENOUS | Status: DC | PRN

## 2016-04-16 ENCOUNTER — Encounter: Payer: Self-pay | Admitting: Hematology & Oncology

## 2016-04-22 ENCOUNTER — Encounter (HOSPITAL_COMMUNITY): Payer: Self-pay

## 2016-04-22 ENCOUNTER — Other Ambulatory Visit: Payer: Self-pay | Admitting: *Deleted

## 2016-04-22 DIAGNOSIS — C184 Malignant neoplasm of transverse colon: Secondary | ICD-10-CM

## 2016-04-23 ENCOUNTER — Other Ambulatory Visit (HOSPITAL_BASED_OUTPATIENT_CLINIC_OR_DEPARTMENT_OTHER): Payer: BLUE CROSS/BLUE SHIELD

## 2016-04-23 ENCOUNTER — Ambulatory Visit (HOSPITAL_BASED_OUTPATIENT_CLINIC_OR_DEPARTMENT_OTHER): Payer: BLUE CROSS/BLUE SHIELD | Admitting: Hematology & Oncology

## 2016-04-23 ENCOUNTER — Ambulatory Visit (HOSPITAL_BASED_OUTPATIENT_CLINIC_OR_DEPARTMENT_OTHER): Payer: BLUE CROSS/BLUE SHIELD

## 2016-04-23 ENCOUNTER — Other Ambulatory Visit: Payer: BLUE CROSS/BLUE SHIELD

## 2016-04-23 VITALS — BP 106/72 | HR 83 | Temp 97.8°F

## 2016-04-23 DIAGNOSIS — C772 Secondary and unspecified malignant neoplasm of intra-abdominal lymph nodes: Principal | ICD-10-CM

## 2016-04-23 DIAGNOSIS — Z5111 Encounter for antineoplastic chemotherapy: Secondary | ICD-10-CM | POA: Diagnosis not present

## 2016-04-23 DIAGNOSIS — C184 Malignant neoplasm of transverse colon: Secondary | ICD-10-CM

## 2016-04-23 DIAGNOSIS — C189 Malignant neoplasm of colon, unspecified: Secondary | ICD-10-CM | POA: Diagnosis not present

## 2016-04-23 DIAGNOSIS — C187 Malignant neoplasm of sigmoid colon: Secondary | ICD-10-CM

## 2016-04-23 LAB — CMP (CANCER CENTER ONLY)
ALBUMIN: 3.4 g/dL (ref 3.3–5.5)
ALK PHOS: 53 U/L (ref 26–84)
ALT(SGPT): 21 U/L (ref 10–47)
AST: 22 U/L (ref 11–38)
BUN, Bld: 10 mg/dL (ref 7–22)
CO2: 26 meq/L (ref 18–33)
Calcium: 8.8 mg/dL (ref 8.0–10.3)
Chloride: 106 mEq/L (ref 98–108)
Creat: 0.7 mg/dl (ref 0.6–1.2)
Glucose, Bld: 82 mg/dL (ref 73–118)
POTASSIUM: 3.7 meq/L (ref 3.3–4.7)
Sodium: 143 mEq/L (ref 128–145)
Total Bilirubin: 0.5 mg/dl (ref 0.20–1.60)
Total Protein: 6.3 g/dL — ABNORMAL LOW (ref 6.4–8.1)

## 2016-04-23 LAB — CBC WITH DIFFERENTIAL (CANCER CENTER ONLY)
BASO#: 0 10*3/uL (ref 0.0–0.2)
BASO%: 0.6 % (ref 0.0–2.0)
EOS ABS: 0.7 10*3/uL — AB (ref 0.0–0.5)
EOS%: 10.4 % — ABNORMAL HIGH (ref 0.0–7.0)
HEMATOCRIT: 35.9 % (ref 34.8–46.6)
HEMOGLOBIN: 12.3 g/dL (ref 11.6–15.9)
LYMPH#: 1.7 10*3/uL (ref 0.9–3.3)
LYMPH%: 27.1 % (ref 14.0–48.0)
MCH: 29.9 pg (ref 26.0–34.0)
MCHC: 34.3 g/dL (ref 32.0–36.0)
MCV: 87 fL (ref 81–101)
MONO#: 0.4 10*3/uL (ref 0.1–0.9)
MONO%: 6.5 % (ref 0.0–13.0)
NEUT#: 3.5 10*3/uL (ref 1.5–6.5)
NEUT%: 55.4 % (ref 39.6–80.0)
Platelets: 312 10*3/uL (ref 145–400)
RBC: 4.11 10*6/uL (ref 3.70–5.32)
RDW: 11.7 % (ref 11.1–15.7)
WBC: 6.3 10*3/uL (ref 3.9–10.0)

## 2016-04-23 LAB — LACTATE DEHYDROGENASE: LDH: 160 U/L (ref 125–245)

## 2016-04-23 LAB — CEA (IN HOUSE-CHCC): CEA (CHCC-IN HOUSE): 1.58 ng/mL (ref 0.00–5.00)

## 2016-04-23 MED ORDER — OXALIPLATIN CHEMO INJECTION 100 MG/20ML
68.0000 mg/m2 | Freq: Once | INTRAVENOUS | Status: AC
Start: 2016-04-23 — End: 2016-04-23
  Administered 2016-04-23: 120 mg via INTRAVENOUS
  Filled 2016-04-23: qty 20

## 2016-04-23 MED ORDER — DEXTROSE 5 % IV SOLN
Freq: Once | INTRAVENOUS | Status: AC
  Administered 2016-04-23: 09:00:00 via INTRAVENOUS

## 2016-04-23 MED ORDER — LEUCOVORIN CALCIUM INJECTION 350 MG
197.0000 mg/m2 | Freq: Once | INTRAVENOUS | Status: AC
  Administered 2016-04-23: 350 mg via INTRAVENOUS
  Filled 2016-04-23: qty 17.5

## 2016-04-23 MED ORDER — DEXTROSE 5 % IV SOLN
132.0000 mg/m2 | Freq: Once | INTRAVENOUS | Status: AC
  Administered 2016-04-23: 240 mg via INTRAVENOUS
  Filled 2016-04-23: qty 10

## 2016-04-23 MED ORDER — DEXAMETHASONE SODIUM PHOSPHATE 10 MG/ML IJ SOLN
INTRAMUSCULAR | Status: AC
  Filled 2016-04-23: qty 1

## 2016-04-23 MED ORDER — SODIUM CHLORIDE 0.9 % IV SOLN
2560.0000 mg/m2 | INTRAVENOUS | Status: DC
  Administered 2016-04-23: 4550 mg via INTRAVENOUS
  Filled 2016-04-23: qty 91

## 2016-04-23 MED ORDER — DEXAMETHASONE SODIUM PHOSPHATE 10 MG/ML IJ SOLN
10.0000 mg | Freq: Once | INTRAMUSCULAR | Status: AC
  Administered 2016-04-23: 10 mg via INTRAVENOUS

## 2016-04-23 MED ORDER — PALONOSETRON HCL INJECTION 0.25 MG/5ML
0.2500 mg | Freq: Once | INTRAVENOUS | Status: AC
  Administered 2016-04-23: 0.25 mg via INTRAVENOUS

## 2016-04-23 MED ORDER — PALONOSETRON HCL INJECTION 0.25 MG/5ML
INTRAVENOUS | Status: AC
  Filled 2016-04-23: qty 5

## 2016-04-23 NOTE — Patient Instructions (Signed)
[facility] Discharge Instructions for Patients Receiving Chemotherapy  Today you received the following chemotherapy agents Oxaliplatin, Leucovorin and Irinotecan.   To help prevent nausea and vomiting after your treatment, we encourage you to take your nausea medication as directed - NO ZOFRAN FOR 3 DAYS  If you develop nausea and vomiting that is not controlled by your nausea medication, call the clinic.   BELOW ARE SYMPTOMS THAT SHOULD BE REPORTED IMMEDIATELY:  *FEVER GREATER THAN 100.5 F  *CHILLS WITH OR WITHOUT FEVER  NAUSEA AND VOMITING THAT IS NOT CONTROLLED WITH YOUR NAUSEA MEDICATION  *UNUSUAL SHORTNESS OF BREATH  *UNUSUAL BRUISING OR BLEEDING  TENDERNESS IN MOUTH AND THROAT WITH OR WITHOUT PRESENCE OF ULCERS  *URINARY PROBLEMS  *BOWEL PROBLEMS  UNUSUAL RASH Items with * indicate a potential emergency and should be followed up as soon as possible.  Feel free to call the clinic you have any questions or concerns. The clinic phone number is [phone].  Please show the [place] at check-in to the Emergency Department and triage nurse.

## 2016-04-23 NOTE — Progress Notes (Signed)
Hematology and Oncology Follow Up Visit  Carla Mills [id number] 06/11/1970 46 y.o. 04/23/2016   Principle Diagnosis:   Recurrent colon cancer - HER2 (-)/MSI stable/MMR normal/BRAF -  Current Therapy:     FOLFOXIRI on 04/09/2016 - s/p cycle #1     Interim History:  Carla Mills is back for follow-up. She tolerated her first cycle of chemotherapy quite well. She had a couple episodes of nausea and vomiting. She will episode about a week after treatment. After that, she began to feel a lot better.  Of note, so far her genetic studies have come back all negative. I'm still awaiting the K-ras.  We did do a PET scan on her. This is done on January 9. This did show some progression of her disease. She had no hepatic metastasis. She had no disease above the diaphragm. We can follow the PET scan. Her CEA level really has not been all that elevated. On January 10, her CEA was 3.5.   Currently, her appetite is good. She is exercising. She is walking. She really looks good.   Overall, her performance status is ECOG 0.  Medications:  Current Outpatient Prescriptions:  .  acetaminophen (TYLENOL) 500 MG tablet, Take 1,000 mg by mouth every 8 (eight) hours as needed., Disp: , Rfl:  .  dexamethasone (DECADRON) 4 MG tablet, Take 2 tablets (8 mg total) by mouth daily. Start the day after chemo and take for 2 days., Disp: 24 tablet, Rfl: 0 .  fexofenadine (ALLEGRA) 180 MG tablet, Take 180 mg by mouth daily as needed for allergies. , Disp: , Rfl:  .  fluticasone (FLONASE) 50 MCG/ACT nasal spray, Place into both nostrils 2 (two) times daily as needed for allergies or rhinitis., Disp: , Rfl:  .  ibuprofen (ADVIL,MOTRIN) 200 MG tablet, Take 400 mg by mouth every 6 (six) hours as needed for headache or mild pain., Disp: , Rfl:  .  lidocaine-prilocaine (EMLA) cream, Apply 1 application topically as needed. Apply to [facility] 1 hour prior to treatment., Disp: 30 g, Rfl: 3 .  loperamide (IMODIUM) 2 MG capsule,  Take 1 capsule (2 mg total) by mouth as needed for diarrhea or loose stools. Take 2 capsules at the onset of diarrhea, then 1 every 2 hours until 12 hours with no BM. May take 2 every 4 hours at night. If diarrhea recurrs, repeat., Disp: 100 capsule, Rfl: 0 .  LORazepam (ATIVAN) 1 MG tablet, Take 1 tablet (1 mg total) by mouth every 6 (six) hours as needed for anxiety. Prn nausea/vomiting, Disp: 30 tablet, Rfl: 0 .  ondansetron (ZOFRAN) 8 MG tablet, Take 1 tablet (8 mg total) by mouth 2 (two) times daily. Start Day 3 after chemo., Disp: 20 tablet, Rfl: 0 .  prochlorperazine (COMPAZINE) 10 MG tablet, Take 1 tablet (10 mg total) by mouth every 6 (six) hours as needed for nausea or vomiting., Disp: 30 tablet, Rfl: 1 .  traMADol (ULTRAM) 50 MG tablet, Take 1 tablet (50 mg total) by mouth every 6 (six) hours as needed., Disp: 90 tablet, Rfl: 0 No current facility-administered medications for this visit.   Facility-Administered Medications Ordered in Other Visits:  .  clindamycin (CLEOCIN) 900 mg in dextrose 5 % 50 mL IVPB, 900 mg, Intravenous, 60 min Pre-Op **AND** gentamicin (GARAMYCIN) 340 mg in dextrose 5 % 50 mL IVPB, 5 mg/kg, Intravenous, 60 min Pre-Op, [name], MD .  dextrose 5 % solution, , Intravenous, Continuous, Volanda Napoleon, MD, Stopped at 04/09/16 1418 .  heparin lock flush 100 unit/mL, 500 Units, Intracatheter, Once PRN, Volanda Napoleon, MD .  sodium chloride flush (NS) 0.9 % injection 10 mL, 10 mL, Intracatheter, PRN, Volanda Napoleon, MD  Allergies:  Allergies  Allergen Reactions  . Bactrim [Sulfamethoxazole-Trimethoprim]   . Oxaprozin Hives    REACTION: Hives  . Penicillins Hives    REACTION: Hives Has patient had a PCN reaction causing immediate rash, facial/tongue/throat swelling, SOB or lightheadedness with hypotension: no Has patient had a PCN reaction causing severe rash involving mucus membranes or skin necrosis: {no Has patient had a PCN reaction that required  hospitalization no Has patient had a PCN reaction occurring within the last 10 years: no If all of the above answers are "NO", then may proceed with Cephalosporin use.  . Sulfonamide Derivatives Swelling    Massive extremity swelling     Past Medical History, Surgical history, Social history, and Family History were reviewed and updated.  Review of Systems:  As above  Physical Exam:  oral temperature is 97.8 F (36.6 C). Her blood pressure is 106/72 and her pulse is 83.   Wt Readings from Last 3 Encounters:  04/08/16 148 lb (67.1 kg)  04/03/16 152 lb 12.8 oz (69.3 kg)  11/15/15 161 lb (73 kg)      Well-developed and well-nourished white female in no obvious distress. Head and neck exam shows no ocular or oral lesions. She has no palpable cervical or supraclavicular lymph nodes. Lungs are clear. Cardiac exam regular rate and rhythm with no murmurs, rubs or bruits. Abdomen is soft. She has well-healed laparotomy scar. She has no fluid wave. There is no palpable liver or spleen tip. There is no obvious abdominal mass. She has no palpable inguinal lymph nodes. Back exam shows no tenderness over the spine, ribs or hips. Extremities shows no clubbing, cyanosis or edema. She's good range motion of her joints. Skin exam shows no rashes, ecchymoses or petechia. Neurological exam shows no focal neurological deficits.  Lab Results  Component Value Date   WBC 6.3 04/23/2016   HGB 12.3 04/23/2016   HCT 35.9 04/23/2016   MCV 87 04/23/2016   PLT 312 04/23/2016     Chemistry      Component Value Date/Time   NA 140 04/03/2016 0902   NA 141 08/25/2015 0820   K 3.9 04/03/2016 0902   K 4.7 08/25/2015 0820   CL 105 04/03/2016 0902   CO2 26 04/03/2016 0902   CO2 23 08/25/2015 0820   BUN 13 04/03/2016 0902   BUN 16.9 08/25/2015 0820   CREATININE 0.7 04/03/2016 0902   CREATININE 0.8 08/25/2015 0820      Component Value Date/Time   CALCIUM 8.9 04/03/2016 0902   CALCIUM 9.3 08/25/2015 0820    ALKPHOS 62 04/03/2016 0902   ALKPHOS 52 08/25/2015 0820   AST 21 04/03/2016 0902   AST 20 08/25/2015 0820   ALT 16 04/03/2016 0902   ALT 21 08/25/2015 0820   BILITOT 0.90 04/03/2016 0902   BILITOT 0.94 08/25/2015 0820         Impression and Plan: Ms. Greenlaw is a 46 year old white female. She initially presented with stage II colon cancer back in I think December 2016. She had a very high Oncotype score of 37. Because of this, we went ahead and gave her adjuvant chemotherapy with Xeloda. Despite this, her cancer has recurred.  We will go ahead with her second cycle of treatment. Hold, she will continued to do well.  I will plan to get her back in 2 weeks for her third cycle. After 4 cycles of treatment, we will then plan for follow-up PET scan.  The fact that she is no longer taking pain medication I think is a good sign. She's having no problems when she urinates so hopefully this pelvic recurrence is responding.   Volanda Napoleon, MD 1/30/20188:38 AM

## 2016-04-25 ENCOUNTER — Ambulatory Visit (HOSPITAL_BASED_OUTPATIENT_CLINIC_OR_DEPARTMENT_OTHER): Payer: BLUE CROSS/BLUE SHIELD

## 2016-04-25 ENCOUNTER — Encounter (HOSPITAL_COMMUNITY): Payer: Self-pay

## 2016-04-25 VITALS — BP 106/55 | HR 80 | Temp 98.2°F | Resp 17

## 2016-04-25 DIAGNOSIS — C772 Secondary and unspecified malignant neoplasm of intra-abdominal lymph nodes: Principal | ICD-10-CM

## 2016-04-25 DIAGNOSIS — C187 Malignant neoplasm of sigmoid colon: Secondary | ICD-10-CM

## 2016-04-25 MED ORDER — SODIUM CHLORIDE 0.9% FLUSH
10.0000 mL | INTRAVENOUS | Status: DC | PRN
  Administered 2016-04-25: 10 mL
  Filled 2016-04-25: qty 10

## 2016-04-25 MED ORDER — HEPARIN SOD (PORK) LOCK FLUSH 100 UNIT/ML IV SOLN
500.0000 [IU] | Freq: Once | INTRAVENOUS | Status: AC | PRN
  Administered 2016-04-25: 500 [IU]
  Filled 2016-04-25: qty 5

## 2016-04-25 NOTE — Patient Instructions (Signed)

## 2016-05-07 ENCOUNTER — Other Ambulatory Visit (HOSPITAL_BASED_OUTPATIENT_CLINIC_OR_DEPARTMENT_OTHER): Payer: BLUE CROSS/BLUE SHIELD

## 2016-05-07 ENCOUNTER — Ambulatory Visit (HOSPITAL_BASED_OUTPATIENT_CLINIC_OR_DEPARTMENT_OTHER): Payer: BLUE CROSS/BLUE SHIELD

## 2016-05-07 ENCOUNTER — Ambulatory Visit: Payer: BLUE CROSS/BLUE SHIELD

## 2016-05-07 ENCOUNTER — Ambulatory Visit (HOSPITAL_BASED_OUTPATIENT_CLINIC_OR_DEPARTMENT_OTHER): Payer: BLUE CROSS/BLUE SHIELD | Admitting: Hematology & Oncology

## 2016-05-07 VITALS — BP 121/54 | HR 77 | Temp 98.0°F | Wt 152.0 lb

## 2016-05-07 DIAGNOSIS — C772 Secondary and unspecified malignant neoplasm of intra-abdominal lymph nodes: Principal | ICD-10-CM

## 2016-05-07 DIAGNOSIS — C187 Malignant neoplasm of sigmoid colon: Secondary | ICD-10-CM

## 2016-05-07 DIAGNOSIS — Z5111 Encounter for antineoplastic chemotherapy: Secondary | ICD-10-CM | POA: Diagnosis not present

## 2016-05-07 DIAGNOSIS — G620 Drug-induced polyneuropathy: Secondary | ICD-10-CM | POA: Diagnosis not present

## 2016-05-07 DIAGNOSIS — C189 Malignant neoplasm of colon, unspecified: Secondary | ICD-10-CM | POA: Diagnosis not present

## 2016-05-07 LAB — CBC WITH DIFFERENTIAL (CANCER CENTER ONLY)
BASO#: 0 10*3/uL (ref 0.0–0.2)
BASO%: 0.7 % (ref 0.0–2.0)
EOS%: 3.1 % (ref 0.0–7.0)
Eosinophils Absolute: 0.2 10*3/uL (ref 0.0–0.5)
HCT: 36.4 % (ref 34.8–46.6)
HGB: 12.5 g/dL (ref 11.6–15.9)
LYMPH#: 1.5 10*3/uL (ref 0.9–3.3)
LYMPH%: 26 % (ref 14.0–48.0)
MCH: 29.6 pg (ref 26.0–34.0)
MCHC: 34.3 g/dL (ref 32.0–36.0)
MCV: 86 fL (ref 81–101)
MONO#: 0.5 10*3/uL (ref 0.1–0.9)
MONO%: 8.5 % (ref 0.0–13.0)
NEUT#: 3.6 10*3/uL (ref 1.5–6.5)
NEUT%: 61.7 % (ref 39.6–80.0)
PLATELETS: 232 10*3/uL (ref 145–400)
RBC: 4.22 10*6/uL (ref 3.70–5.32)
RDW: 12.4 % (ref 11.1–15.7)
WBC: 5.9 10*3/uL (ref 3.9–10.0)

## 2016-05-07 LAB — CMP (CANCER CENTER ONLY)
ALT: 45 U/L (ref 10–47)
AST: 35 U/L (ref 11–38)
Albumin: 3.5 g/dL (ref 3.3–5.5)
Alkaline Phosphatase: 55 U/L (ref 26–84)
BUN: 8 mg/dL (ref 7–22)
CHLORIDE: 106 meq/L (ref 98–108)
CO2: 27 mEq/L (ref 18–33)
Calcium: 9 mg/dL (ref 8.0–10.3)
Creat: 0.7 mg/dl (ref 0.6–1.2)
GLUCOSE: 89 mg/dL (ref 73–118)
POTASSIUM: 4.2 meq/L (ref 3.3–4.7)
Sodium: 142 mEq/L (ref 128–145)
Total Bilirubin: 0.7 mg/dl (ref 0.20–1.60)
Total Protein: 6.3 g/dL — ABNORMAL LOW (ref 6.4–8.1)

## 2016-05-07 LAB — CEA (IN HOUSE-CHCC): CEA (CHCC-In House): 1.9 ng/mL (ref 0.00–5.00)

## 2016-05-07 MED ORDER — DEXTROSE 5 % IV SOLN
68.0000 mg/m2 | Freq: Once | INTRAVENOUS | Status: AC
  Administered 2016-05-07: 120 mg via INTRAVENOUS
  Filled 2016-05-07: qty 4

## 2016-05-07 MED ORDER — SODIUM CHLORIDE 0.9% FLUSH
10.0000 mL | INTRAVENOUS | Status: DC | PRN
  Filled 2016-05-07: qty 10

## 2016-05-07 MED ORDER — DEXTROSE 5 % IV SOLN
Freq: Once | INTRAVENOUS | Status: AC
  Administered 2016-05-07: 11:00:00 via INTRAVENOUS

## 2016-05-07 MED ORDER — PALONOSETRON HCL INJECTION 0.25 MG/5ML
INTRAVENOUS | Status: AC
  Filled 2016-05-07: qty 5

## 2016-05-07 MED ORDER — DEXTROSE 5 % IV SOLN
197.0000 mg/m2 | Freq: Once | INTRAVENOUS | Status: AC
  Administered 2016-05-07: 350 mg via INTRAVENOUS
  Filled 2016-05-07: qty 17.5

## 2016-05-07 MED ORDER — LEUCOVORIN CALCIUM INJECTION 350 MG
200.0000 mg/m2 | Freq: Once | INTRAVENOUS | Status: DC
  Filled 2016-05-07: qty 17.8

## 2016-05-07 MED ORDER — PALONOSETRON HCL INJECTION 0.25 MG/5ML
0.2500 mg | Freq: Once | INTRAVENOUS | Status: AC
  Administered 2016-05-07: 0.25 mg via INTRAVENOUS

## 2016-05-07 MED ORDER — DEXAMETHASONE SODIUM PHOSPHATE 10 MG/ML IJ SOLN
INTRAMUSCULAR | Status: AC
  Filled 2016-05-07: qty 1

## 2016-05-07 MED ORDER — DEXAMETHASONE SODIUM PHOSPHATE 10 MG/ML IJ SOLN
10.0000 mg | Freq: Once | INTRAMUSCULAR | Status: AC
  Administered 2016-05-07: 10 mg via INTRAVENOUS

## 2016-05-07 MED ORDER — DEXTROSE 5 % IV SOLN
132.0000 mg/m2 | Freq: Once | INTRAVENOUS | Status: AC
  Administered 2016-05-07: 240 mg via INTRAVENOUS
  Filled 2016-05-07: qty 10

## 2016-05-07 MED ORDER — HEPARIN SOD (PORK) LOCK FLUSH 100 UNIT/ML IV SOLN
500.0000 [IU] | Freq: Once | INTRAVENOUS | Status: DC | PRN
  Filled 2016-05-07: qty 5

## 2016-05-07 MED ORDER — SODIUM CHLORIDE 0.9 % IV SOLN
2560.0000 mg/m2 | INTRAVENOUS | Status: DC
  Administered 2016-05-07: 4550 mg via INTRAVENOUS
  Filled 2016-05-07: qty 91

## 2016-05-07 NOTE — Patient Instructions (Signed)
[facility] Discharge Instructions for Patients Receiving Chemotherapy  Today you received the following chemotherapy agents Oxaliplatin, Leucovorin and Irinotecan.   To help prevent nausea and vomiting after your treatment, we encourage you to take your nausea medication as directed - NO ZOFRAN FOR 3 DAYS  If you develop nausea and vomiting that is not controlled by your nausea medication, call the clinic.   BELOW ARE SYMPTOMS THAT SHOULD BE REPORTED IMMEDIATELY:  *FEVER GREATER THAN 100.5 F  *CHILLS WITH OR WITHOUT FEVER  NAUSEA AND VOMITING THAT IS NOT CONTROLLED WITH YOUR NAUSEA MEDICATION  *UNUSUAL SHORTNESS OF BREATH  *UNUSUAL BRUISING OR BLEEDING  TENDERNESS IN MOUTH AND THROAT WITH OR WITHOUT PRESENCE OF ULCERS  *URINARY PROBLEMS  *BOWEL PROBLEMS  UNUSUAL RASH Items with * indicate a potential emergency and should be followed up as soon as possible.  Feel free to call the clinic you have any questions or concerns. The clinic phone number is [phone].  Please show the [place] at check-in to the Emergency Department and triage nurse.

## 2016-05-07 NOTE — Patient Instructions (Signed)

## 2016-05-07 NOTE — Progress Notes (Signed)
Hematology and Oncology Follow Up Visit  Carla Mills [id number] 10/03/1970 46 y.o. 05/07/2016   Principle Diagnosis:   Recurrent colon cancer - HER2 (-)/MSI stable/MMR normal/BRAF -  Current Therapy:     FOLFOXIRI on 04/09/2016 - s/p cycle #1     Interim History:  Carla Mills is back for follow-up. She tolerated her first cycle of chemotherapy quite well. She had a couple episodes of nausea and vomiting. She will episode about a week after treatment. After that, she began to feel a lot better.  Of note, so far her genetic studies have come back all negative. I'm still awaiting the K-ras.  She really has done well with treatment. 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 This is a little bit longer than normal. I offered her some Effexor. She wants to hold off on this for right now.  She's not having any neuropathy in her hands or feet.  Of note, her CEA has responded very nicely. He with her CEA was never that elevated, it is come down nicely. It is now 1.6.  She's having no problems with bleeding or bruising. She's having no fever. There's no mouth sores.    Overall, her performance status is ECOG 0.  Medications:  Current Outpatient Prescriptions:  .  acetaminophen (TYLENOL) 500 MG tablet, Take 1,000 mg by mouth every 8 (eight) hours as needed., Disp: , Rfl:  .  dexamethasone (DECADRON) 4 MG tablet, Take 2 tablets (8 mg total) by mouth daily. Start the day after chemo and take for 2 days., Disp: 24 tablet, Rfl: 0 .  fexofenadine (ALLEGRA) 180 MG tablet, Take 180 mg by mouth daily as needed for allergies. , Disp: , Rfl:  .  fluticasone (FLONASE) 50 MCG/ACT nasal spray, Place into both nostrils 2 (two) times daily as needed for allergies or rhinitis., Disp: , Rfl:  .  ibuprofen (ADVIL,MOTRIN) 200 MG tablet, Take 400 mg by mouth every 6 (six) hours as needed for headache or mild pain., Disp: , Rfl:  .   lidocaine-prilocaine (EMLA) cream, Apply 1 application topically as needed. Apply to [facility] 1 hour prior to treatment., Disp: 30 g, Rfl: 3 .  loperamide (IMODIUM) 2 MG capsule, Take 1 capsule (2 mg total) by mouth as needed for diarrhea or loose stools. Take 2 capsules at the onset of diarrhea, then 1 every 2 hours until 12 hours with no BM. May take 2 every 4 hours at night. If diarrhea recurrs, repeat., Disp: 100 capsule, Rfl: 0 .  LORazepam (ATIVAN) 1 MG tablet, Take 1 tablet (1 mg total) by mouth every 6 (six) hours as needed for anxiety. Prn nausea/vomiting, Disp: 30 tablet, Rfl: 0 .  ondansetron (ZOFRAN) 8 MG tablet, Take 1 tablet (8 mg total) by mouth 2 (two) times daily. Start Day 3 after chemo., Disp: 20 tablet, Rfl: 0 .  prochlorperazine (COMPAZINE) 10 MG tablet, Take 1 tablet (10 mg total) by mouth every 6 (six) hours as needed for nausea or vomiting., Disp: 30 tablet, Rfl: 1 .  traMADol (ULTRAM) 50 MG tablet, Take 1 tablet (50 mg total) by mouth every 6 (six) hours as needed., Disp: 90 tablet, Rfl: 0 No current facility-administered medications for this visit.   Facility-Administered Medications Ordered in Other Visits:  .  clindamycin (CLEOCIN) 900 mg in dextrose 5 % 50 mL IVPB, 900 mg, Intravenous, 60 min Pre-Op **AND** gentamicin (GARAMYCIN) 340 mg in dextrose 5 % 50 mL  IVPB, 5 mg/kg, Intravenous, 60 min Pre-Op, [name], MD .  dextrose 5 % solution, , Intravenous, Continuous, Volanda Napoleon, MD, Stopped at 04/09/16 1418 .  heparin lock flush 100 unit/mL, 500 Units, Intracatheter, Once PRN, Volanda Napoleon, MD .  sodium chloride flush (NS) 0.9 % injection 10 mL, 10 mL, Intracatheter, PRN, Volanda Napoleon, MD  Allergies:  Allergies  Allergen Reactions  . Bactrim [Sulfamethoxazole-Trimethoprim]   . Oxaprozin Hives    REACTION: Hives  . Penicillins Hives    REACTION: Hives Has patient had a PCN reaction causing immediate rash, facial/tongue/throat swelling, SOB or  lightheadedness with hypotension: no Has patient had a PCN reaction causing severe rash involving mucus membranes or skin necrosis: {no Has patient had a PCN reaction that required hospitalization no Has patient had a PCN reaction occurring within the last 10 years: no If all of the above answers are "NO", then may proceed with Cephalosporin use.  . Sulfonamide Derivatives Swelling    Massive extremity swelling     Past Medical History, Surgical history, Social history, and Family History were reviewed and updated.  Review of Systems:  As above  Physical Exam:  weight is 152 lb 0.6 oz (69 kg). Her oral temperature is 98 F (36.7 C). Her blood pressure is 121/54 (abnormal) and her pulse is 77.   Wt Readings from Last 3 Encounters:  05/07/16 152 lb 0.6 oz (69 kg)  04/08/16 148 lb (67.1 kg)  04/03/16 152 lb 12.8 oz (69.3 kg)      Well-developed and well-nourished white female in no obvious distress. Head and neck exam shows no ocular or oral lesions. She has no palpable cervical or supraclavicular lymph nodes. Lungs are clear. Cardiac exam regular rate and rhythm with no murmurs, rubs or bruits. Abdomen is soft. She has well-healed laparotomy scar. She has no fluid wave. There is no palpable liver or spleen tip. There is no obvious abdominal mass. She has no palpable inguinal lymph nodes. Back exam shows no tenderness over the spine, ribs or hips. Extremities shows no clubbing, cyanosis or edema. She's good range motion of her joints. Skin exam shows no rashes, ecchymoses or petechia. Neurological exam shows no focal neurological deficits.  Lab Results  Component Value Date   WBC 5.9 05/07/2016   HGB 12.5 05/07/2016   HCT 36.4 05/07/2016   MCV 86 05/07/2016   PLT 232 05/07/2016     Chemistry      Component Value Date/Time   NA 142 05/07/2016 0939   NA 141 08/25/2015 0820   K 4.2 05/07/2016 0939   K 4.7 08/25/2015 0820   CL 106 05/07/2016 0939   CO2 27 05/07/2016 0939   CO2 23  08/25/2015 0820   BUN 8 05/07/2016 0939   BUN 16.9 08/25/2015 0820   CREATININE 0.7 05/07/2016 0939   CREATININE 0.8 08/25/2015 0820      Component Value Date/Time   CALCIUM 9.0 05/07/2016 0939   CALCIUM 9.3 08/25/2015 0820   ALKPHOS 55 05/07/2016 0939   ALKPHOS 52 08/25/2015 0820   AST 35 05/07/2016 0939   AST 20 08/25/2015 0820   ALT 45 05/07/2016 0939   ALT 21 08/25/2015 0820   BILITOT 0.70 05/07/2016 0939   BILITOT 0.94 08/25/2015 0820         Impression and Plan: Ms. Bracher is a 46 year old white female. She initially presented with stage II colon cancer back in I think December 2016. She had a very high Oncotype  score of 37. Because of this, we went ahead and gave her adjuvant chemotherapy with Xeloda. Despite this, her cancer has recurred.  We will go ahead with her 3rd cycle of treatment.   I will plan to get her back in 2 weeks for her 4th cycle. After 4 cycles of treatment, we will then plan for follow-up PET scan.  The fact that she is no longer taking pain medication I think is a good sign. She's having no problems when she urinates so hopefully this pelvic recurrence is responding.   Volanda Napoleon, MD 2/13/201810:20 AM

## 2016-05-09 ENCOUNTER — Ambulatory Visit (HOSPITAL_BASED_OUTPATIENT_CLINIC_OR_DEPARTMENT_OTHER): Payer: BLUE CROSS/BLUE SHIELD

## 2016-05-09 VITALS — BP 109/63 | HR 79

## 2016-05-09 DIAGNOSIS — C187 Malignant neoplasm of sigmoid colon: Secondary | ICD-10-CM | POA: Diagnosis not present

## 2016-05-09 DIAGNOSIS — C772 Secondary and unspecified malignant neoplasm of intra-abdominal lymph nodes: Principal | ICD-10-CM

## 2016-05-09 MED ORDER — HEPARIN SOD (PORK) LOCK FLUSH 100 UNIT/ML IV SOLN
500.0000 [IU] | Freq: Once | INTRAVENOUS | Status: AC | PRN
  Administered 2016-05-09: 500 [IU]
  Filled 2016-05-09: qty 5

## 2016-05-09 MED ORDER — SODIUM CHLORIDE 0.9% FLUSH
10.0000 mL | INTRAVENOUS | Status: DC | PRN
  Administered 2016-05-09: 10 mL
  Filled 2016-05-09: qty 10

## 2016-05-21 ENCOUNTER — Ambulatory Visit: Payer: BLUE CROSS/BLUE SHIELD

## 2016-05-21 ENCOUNTER — Ambulatory Visit (HOSPITAL_BASED_OUTPATIENT_CLINIC_OR_DEPARTMENT_OTHER): Payer: BLUE CROSS/BLUE SHIELD

## 2016-05-21 ENCOUNTER — Ambulatory Visit (HOSPITAL_BASED_OUTPATIENT_CLINIC_OR_DEPARTMENT_OTHER): Payer: BLUE CROSS/BLUE SHIELD | Admitting: Hematology & Oncology

## 2016-05-21 ENCOUNTER — Other Ambulatory Visit (HOSPITAL_BASED_OUTPATIENT_CLINIC_OR_DEPARTMENT_OTHER): Payer: BLUE CROSS/BLUE SHIELD

## 2016-05-21 DIAGNOSIS — Z5111 Encounter for antineoplastic chemotherapy: Secondary | ICD-10-CM | POA: Diagnosis not present

## 2016-05-21 DIAGNOSIS — C187 Malignant neoplasm of sigmoid colon: Secondary | ICD-10-CM

## 2016-05-21 DIAGNOSIS — C772 Secondary and unspecified malignant neoplasm of intra-abdominal lymph nodes: Principal | ICD-10-CM

## 2016-05-21 LAB — CBC WITH DIFFERENTIAL (CANCER CENTER ONLY)
BASO#: 0 10*3/uL (ref 0.0–0.2)
BASO%: 0.3 % (ref 0.0–2.0)
EOS%: 2.1 % (ref 0.0–7.0)
Eosinophils Absolute: 0.2 10*3/uL (ref 0.0–0.5)
HCT: 37.6 % (ref 34.8–46.6)
HGB: 13 g/dL (ref 11.6–15.9)
LYMPH#: 1.4 10*3/uL (ref 0.9–3.3)
LYMPH%: 19 % (ref 14.0–48.0)
MCH: 29.8 pg (ref 26.0–34.0)
MCHC: 34.6 g/dL (ref 32.0–36.0)
MCV: 86 fL (ref 81–101)
MONO#: 0.5 10*3/uL (ref 0.1–0.9)
MONO%: 7.2 % (ref 0.0–13.0)
NEUT%: 71.4 % (ref 39.6–80.0)
NEUTROS ABS: 5.1 10*3/uL (ref 1.5–6.5)
PLATELETS: 234 10*3/uL (ref 145–400)
RBC: 4.36 10*6/uL (ref 3.70–5.32)
RDW: 13 % (ref 11.1–15.7)
WBC: 7.1 10*3/uL (ref 3.9–10.0)

## 2016-05-21 LAB — CMP (CANCER CENTER ONLY)
ALT(SGPT): 45 U/L (ref 10–47)
AST: 37 U/L (ref 11–38)
Albumin: 3.5 g/dL (ref 3.3–5.5)
Alkaline Phosphatase: 67 U/L (ref 26–84)
BILIRUBIN TOTAL: 0.7 mg/dL (ref 0.20–1.60)
BUN: 11 mg/dL (ref 7–22)
CHLORIDE: 105 meq/L (ref 98–108)
CO2: 27 meq/L (ref 18–33)
CREATININE: 0.7 mg/dL (ref 0.6–1.2)
Calcium: 8.9 mg/dL (ref 8.0–10.3)
GLUCOSE: 96 mg/dL (ref 73–118)
Potassium: 3.8 mEq/L (ref 3.3–4.7)
SODIUM: 143 meq/L (ref 128–145)
Total Protein: 6.3 g/dL — ABNORMAL LOW (ref 6.4–8.1)

## 2016-05-21 LAB — CEA (IN HOUSE-CHCC): CEA (CHCC-IN HOUSE): 1.77 ng/mL (ref 0.00–5.00)

## 2016-05-21 LAB — LACTATE DEHYDROGENASE: LDH: 206 U/L (ref 125–245)

## 2016-05-21 MED ORDER — PALONOSETRON HCL INJECTION 0.25 MG/5ML
INTRAVENOUS | Status: AC
  Filled 2016-05-21: qty 5

## 2016-05-21 MED ORDER — SODIUM CHLORIDE 0.9% FLUSH
10.0000 mL | INTRAVENOUS | Status: DC | PRN
  Filled 2016-05-21: qty 10

## 2016-05-21 MED ORDER — DEXAMETHASONE SODIUM PHOSPHATE 10 MG/ML IJ SOLN
INTRAMUSCULAR | Status: AC
  Filled 2016-05-21: qty 1

## 2016-05-21 MED ORDER — PALONOSETRON HCL INJECTION 0.25 MG/5ML
0.2500 mg | Freq: Once | INTRAVENOUS | Status: AC
  Administered 2016-05-21: 0.25 mg via INTRAVENOUS

## 2016-05-21 MED ORDER — DEXAMETHASONE SODIUM PHOSPHATE 10 MG/ML IJ SOLN
10.0000 mg | Freq: Once | INTRAMUSCULAR | Status: AC
  Administered 2016-05-21: 10 mg via INTRAVENOUS

## 2016-05-21 MED ORDER — LEUCOVORIN CALCIUM INJECTION 350 MG
197.0000 mg/m2 | Freq: Once | INTRAVENOUS | Status: AC
  Administered 2016-05-21: 350 mg via INTRAVENOUS
  Filled 2016-05-21: qty 17.5

## 2016-05-21 MED ORDER — IRINOTECAN HCL CHEMO INJECTION 100 MG/5ML
132.0000 mg/m2 | Freq: Once | INTRAVENOUS | Status: AC
  Administered 2016-05-21: 240 mg via INTRAVENOUS
  Filled 2016-05-21: qty 5

## 2016-05-21 MED ORDER — SODIUM CHLORIDE 0.9 % IV SOLN
2560.0000 mg/m2 | INTRAVENOUS | Status: DC
  Administered 2016-05-21: 4550 mg via INTRAVENOUS
  Filled 2016-05-21: qty 91

## 2016-05-21 MED ORDER — DEXTROSE 5 % IV SOLN
Freq: Once | INTRAVENOUS | Status: AC
  Administered 2016-05-21: 11:00:00 via INTRAVENOUS

## 2016-05-21 MED ORDER — OXALIPLATIN CHEMO INJECTION 100 MG/20ML
68.0000 mg/m2 | Freq: Once | INTRAVENOUS | Status: AC
  Administered 2016-05-21: 120 mg via INTRAVENOUS
  Filled 2016-05-21: qty 20

## 2016-05-21 MED ORDER — HEPARIN SOD (PORK) LOCK FLUSH 100 UNIT/ML IV SOLN
500.0000 [IU] | Freq: Once | INTRAVENOUS | Status: DC | PRN
  Filled 2016-05-21: qty 5

## 2016-05-21 MED FILL — PROCHLORPERAZINE 10 MG TAB: 10 | 7 days supply | Qty: 30 | Fill #1

## 2016-05-21 NOTE — Patient Instructions (Signed)
[facility] Discharge Instructions for Patients Receiving Chemotherapy  Today you received the following chemotherapy agents Oxaliplatin, Leucovorin and Irinotecan.   To help prevent nausea and vomiting after your treatment, we encourage you to take your nausea medication as directed - NO ZOFRAN FOR 3 DAYS  If you develop nausea and vomiting that is not controlled by your nausea medication, call the clinic.   BELOW ARE SYMPTOMS THAT SHOULD BE REPORTED IMMEDIATELY:  *FEVER GREATER THAN 100.5 F  *CHILLS WITH OR WITHOUT FEVER  NAUSEA AND VOMITING THAT IS NOT CONTROLLED WITH YOUR NAUSEA MEDICATION  *UNUSUAL SHORTNESS OF BREATH  *UNUSUAL BRUISING OR BLEEDING  TENDERNESS IN MOUTH AND THROAT WITH OR WITHOUT PRESENCE OF ULCERS  *URINARY PROBLEMS  *BOWEL PROBLEMS  UNUSUAL RASH Items with * indicate a potential emergency and should be followed up as soon as possible.  Feel free to call the clinic you have any questions or concerns. The clinic phone number is [phone].  Please show the [facility] at check-in to the Emergency Department and triage nurse.

## 2016-05-21 NOTE — Patient Instructions (Signed)

## 2016-05-21 NOTE — Progress Notes (Signed)
Hematology and Oncology Follow Up Visit  Devoiry Corriher [id number] 03/13/1971 46 y.o. 05/21/2016   Principle Diagnosis:   Recurrent colon cancer - HER2 (-)/MSI stable/MMR normal/BRAF -  Current Therapy:     FOLFOXIRI on 04/09/2016 - s/p cycle #3     Interim History:  Ms. Haran is back for follow-up. She has done very well. She really has had very few side effects of therapy.  She's had no issues with nausea or vomiting. She's been exercising. She's been doing a lot of walking.  She's had no abdominal pain. There's no problems when she goes to the bathroom. She's had no diarrhea. She's had no dysuria. She's had no melena or bright red blood parenchyma.   Her last CEA was 1.9 which is holding fairly steady.   Her appetite has been good. Her weight is holding stable.   She's had no rashes. There's been no leg swelling. She's had no weakness. She's had no headache.  Overall, her performance status is ECOG 0.  Medications:  Current Outpatient Prescriptions:  .  acetaminophen (TYLENOL) 500 MG tablet, Take 1,000 mg by mouth every 8 (eight) hours as needed., Disp: , Rfl:  .  dexamethasone (DECADRON) 4 MG tablet, Take 2 tablets (8 mg total) by mouth daily. Start the day after chemo and take for 2 days., Disp: 24 tablet, Rfl: 0 .  fexofenadine (ALLEGRA) 180 MG tablet, Take 180 mg by mouth daily as needed for allergies. , Disp: , Rfl:  .  fluticasone (FLONASE) 50 MCG/ACT nasal spray, Place into both nostrils 2 (two) times daily as needed for allergies or rhinitis., Disp: , Rfl:  .  ibuprofen (ADVIL,MOTRIN) 200 MG tablet, Take 400 mg by mouth every 6 (six) hours as needed for headache or mild pain., Disp: , Rfl:  .  lidocaine-prilocaine (EMLA) cream, Apply 1 application topically as needed. Apply to [facility] 1 hour prior to treatment., Disp: 30 g, Rfl: 3 .  loperamide (IMODIUM) 2 MG capsule, Take 1 capsule (2 mg total) by mouth as needed for diarrhea or loose stools. Take 2 capsules at the  onset of diarrhea, then 1 every 2 hours until 12 hours with no BM. May take 2 every 4 hours at night. If diarrhea recurrs, repeat., Disp: 100 capsule, Rfl: 0 .  LORazepam (ATIVAN) 1 MG tablet, Take 1 tablet (1 mg total) by mouth every 6 (six) hours as needed for anxiety. Prn nausea/vomiting, Disp: 30 tablet, Rfl: 0 .  ondansetron (ZOFRAN) 8 MG tablet, Take 1 tablet (8 mg total) by mouth 2 (two) times daily. Start Day 3 after chemo., Disp: 20 tablet, Rfl: 0 .  prochlorperazine (COMPAZINE) 10 MG tablet, Take 1 tablet (10 mg total) by mouth every 6 (six) hours as needed for nausea or vomiting., Disp: 30 tablet, Rfl: 1 .  traMADol (ULTRAM) 50 MG tablet, Take 1 tablet (50 mg total) by mouth every 6 (six) hours as needed., Disp: 90 tablet, Rfl: 0 No current facility-administered medications for this visit.   Facility-Administered Medications Ordered in Other Visits:  .  clindamycin (CLEOCIN) 900 mg in dextrose 5 % 50 mL IVPB, 900 mg, Intravenous, 60 min Pre-Op **AND** gentamicin (GARAMYCIN) 340 mg in dextrose 5 % 50 mL IVPB, 5 mg/kg, Intravenous, 60 min Pre-Op, [name], MD .  dextrose 5 % solution, , Intravenous, Continuous, Volanda Napoleon, MD, Stopped at 04/09/16 1418 .  fluorouracil (ADRUCIL) 4,550 mg in sodium chloride 0.9 % 59 mL chemo infusion, 2,560 mg/m2 (Treatment Plan Recorded), Intravenous,  1 day or 1 dose, Volanda Napoleon, MD .  heparin lock flush 100 unit/mL, 500 Units, Intracatheter, Once PRN, Volanda Napoleon, MD .  heparin lock flush 100 unit/mL, 500 Units, Intracatheter, Once PRN, Volanda Napoleon, MD .  irinotecan (CAMPTOSAR) 240 mg in dextrose 5 % 500 mL chemo infusion, 132 mg/m2 (Treatment Plan Recorded), Intravenous, Once, Volanda Napoleon, MD, Last Rate: 512 mL/hr at 05/21/16 1125, 240 mg at 05/21/16 1125 .  leucovorin 350 mg in dextrose 5 % 250 mL infusion, 197 mg/m2 (Treatment Plan Recorded), Intravenous, Once, Volanda Napoleon, MD .  oxaliplatin (ELOXATIN) 120 mg in dextrose 5 %  500 mL chemo infusion, 68 mg/m2 (Treatment Plan Recorded), Intravenous, Once, Volanda Napoleon, MD .  sodium chloride flush (NS) 0.9 % injection 10 mL, 10 mL, Intracatheter, PRN, Volanda Napoleon, MD .  sodium chloride flush (NS) 0.9 % injection 10 mL, 10 mL, Intracatheter, PRN, Volanda Napoleon, MD  Allergies:  Allergies  Allergen Reactions  . Bactrim [Sulfamethoxazole-Trimethoprim]   . Oxaprozin Hives    REACTION: Hives  . Penicillins Hives    REACTION: Hives Has patient had a PCN reaction causing immediate rash, facial/tongue/throat swelling, SOB or lightheadedness with hypotension: no Has patient had a PCN reaction causing severe rash involving mucus membranes or skin necrosis: {no Has patient had a PCN reaction that required hospitalization no Has patient had a PCN reaction occurring within the last 10 years: no If all of the above answers are "NO", then may proceed with Cephalosporin use.  . Sulfonamide Derivatives Swelling    Massive extremity swelling     Past Medical History, Surgical history, Social history, and Family History were reviewed and updated.  Review of Systems:  As above  Physical Exam:  vitals were not taken for this visit.  Wt Readings from Last 3 Encounters:  05/07/16 152 lb 0.6 oz (69 kg)  04/08/16 148 lb (67.1 kg)  04/03/16 152 lb 12.8 oz (69.3 kg)      Well-developed and well-nourished white female in no obvious distress. Head and neck exam shows no ocular or oral lesions. She has no palpable cervical or supraclavicular lymph nodes. Lungs are clear. Cardiac exam regular rate and rhythm with no murmurs, rubs or bruits. Abdomen is soft. She has well-healed laparotomy scar. She has no fluid wave. There is no palpable liver or spleen tip. There is no obvious abdominal mass. She has no palpable inguinal lymph nodes. Back exam shows no tenderness over the spine, ribs or hips. Extremities shows no clubbing, cyanosis or edema. She's good range motion of her  joints. Skin exam shows no rashes, ecchymoses or petechia. Neurological exam shows no focal neurological deficits.  Lab Results  Component Value Date   WBC 7.1 05/21/2016   HGB 13.0 05/21/2016   HCT 37.6 05/21/2016   MCV 86 05/21/2016   PLT 234 05/21/2016     Chemistry      Component Value Date/Time   NA 143 05/21/2016 0904   NA 141 08/25/2015 0820   K 3.8 05/21/2016 0904   K 4.7 08/25/2015 0820   CL 105 05/21/2016 0904   CO2 27 05/21/2016 0904   CO2 23 08/25/2015 0820   BUN 11 05/21/2016 0904   BUN 16.9 08/25/2015 0820   CREATININE 0.7 05/21/2016 0904   CREATININE 0.8 08/25/2015 0820      Component Value Date/Time   CALCIUM 8.9 05/21/2016 0904   CALCIUM 9.3 08/25/2015 0820   ALKPHOS 67 05/21/2016  0904   ALKPHOS 52 08/25/2015 0820   AST 37 05/21/2016 0904   AST 20 08/25/2015 0820   ALT 45 05/21/2016 0904   ALT 21 08/25/2015 0820   BILITOT 0.70 05/21/2016 0904   BILITOT 0.94 08/25/2015 0820         Impression and Plan: Ms. Larimer is a 46 year old white female. She initially presented with stage II colon cancer back in I think December 2016. She had a very high Oncotype score of 37. Because of this, we went ahead and gave her adjuvant chemotherapy with Xeloda. Despite this, her cancer has recurred.  We will go ahead with her 4th cycle of treatment.   I will then plan to do a PET scan on her. The PET scan will be next week. I will like to believe that the PET scan will show improvement.  I will then have to call her surgeon at [facility], Dr. Crisoforo Oxford, and that him know of the results. He may want to see her so that they can intervene with HIPEC protocol.  She and her husband are going to [place] next week. I told her to make sure that she drinks a lot of water and wears a lot of sunscreen to protect her skin from the side effects that may happen with 5-FU.     Volanda Napoleon, MD 2/27/201812:18 PM

## 2016-05-23 ENCOUNTER — Ambulatory Visit (HOSPITAL_BASED_OUTPATIENT_CLINIC_OR_DEPARTMENT_OTHER): Payer: BLUE CROSS/BLUE SHIELD

## 2016-05-23 VITALS — BP 113/74 | HR 87 | Temp 97.8°F | Resp 18

## 2016-05-23 DIAGNOSIS — C187 Malignant neoplasm of sigmoid colon: Secondary | ICD-10-CM

## 2016-05-23 DIAGNOSIS — C772 Secondary and unspecified malignant neoplasm of intra-abdominal lymph nodes: Secondary | ICD-10-CM

## 2016-05-23 MED ORDER — SODIUM CHLORIDE 0.9% FLUSH
10.0000 mL | INTRAVENOUS | Status: DC | PRN
  Administered 2016-05-23: 10 mL
  Filled 2016-05-23: qty 10

## 2016-05-23 MED ORDER — HEPARIN SOD (PORK) LOCK FLUSH 100 UNIT/ML IV SOLN
500.0000 [IU] | Freq: Once | INTRAVENOUS | Status: AC | PRN
Start: 2016-05-23 — End: 2016-05-23
  Administered 2016-05-23: 500 [IU]
  Filled 2016-05-23: qty 5

## 2016-05-24 ENCOUNTER — Observation Stay (HOSPITAL_BASED_OUTPATIENT_CLINIC_OR_DEPARTMENT_OTHER)
Admission: EM | Admit: 2016-05-24 | Discharge: 2016-05-26 | Disposition: A | Discharge status: Home / Self Care | Payer: BLUE CROSS/BLUE SHIELD | Attending: Family Medicine | Admitting: Family Medicine

## 2016-05-24 ENCOUNTER — Emergency Department (HOSPITAL_BASED_OUTPATIENT_CLINIC_OR_DEPARTMENT_OTHER): Payer: BLUE CROSS/BLUE SHIELD

## 2016-05-24 ENCOUNTER — Telehealth: Payer: Self-pay | Admitting: *Deleted

## 2016-05-24 ENCOUNTER — Encounter (HOSPITAL_BASED_OUTPATIENT_CLINIC_OR_DEPARTMENT_OTHER): Payer: Self-pay | Admitting: Emergency Medicine

## 2016-05-24 DIAGNOSIS — C184 Malignant neoplasm of transverse colon: Secondary | ICD-10-CM

## 2016-05-24 DIAGNOSIS — R112 Nausea with vomiting, unspecified: Secondary | ICD-10-CM | POA: Diagnosis present

## 2016-05-24 DIAGNOSIS — C187 Malignant neoplasm of sigmoid colon: Secondary | ICD-10-CM | POA: Insufficient documentation

## 2016-05-24 DIAGNOSIS — C189 Malignant neoplasm of colon, unspecified: Secondary | ICD-10-CM | POA: Diagnosis present

## 2016-05-24 DIAGNOSIS — R109 Unspecified abdominal pain: Secondary | ICD-10-CM | POA: Diagnosis not present

## 2016-05-24 DIAGNOSIS — K529 Noninfective gastroenteritis and colitis, unspecified: Principal | ICD-10-CM | POA: Diagnosis present

## 2016-05-24 DIAGNOSIS — F411 Generalized anxiety disorder: Secondary | ICD-10-CM | POA: Diagnosis present

## 2016-05-24 LAB — CBC WITH DIFFERENTIAL/PLATELET
BASOS ABS: 0 10*3/uL (ref 0.0–0.1)
Basophils Relative: 0 %
EOS ABS: 0.1 10*3/uL (ref 0.0–0.7)
Eosinophils Relative: 1 %
HCT: 37.1 % (ref 36.0–46.0)
HEMOGLOBIN: 12.8 g/dL (ref 12.0–15.0)
LYMPHS ABS: 1.3 10*3/uL (ref 0.7–4.0)
LYMPHS PCT: 23 %
MCH: 29.6 pg (ref 26.0–34.0)
MCHC: 34.5 g/dL (ref 30.0–36.0)
MCV: 85.7 fL (ref 78.0–100.0)
Monocytes Absolute: 0.3 10*3/uL (ref 0.1–1.0)
Monocytes Relative: 6 %
NEUTROS PCT: 70 %
Neutro Abs: 4.1 10*3/uL (ref 1.7–7.7)
Platelets: 198 10*3/uL (ref 150–400)
RBC: 4.33 MIL/uL (ref 3.87–5.11)
RDW: 12.9 % (ref 11.5–15.5)
WBC: 5.8 10*3/uL (ref 4.0–10.5)

## 2016-05-24 LAB — URINALYSIS, ROUTINE W REFLEX MICROSCOPIC
BILIRUBIN URINE: NEGATIVE
GLUCOSE, UA: NEGATIVE mg/dL
HGB URINE DIPSTICK: NEGATIVE
Ketones, ur: NEGATIVE mg/dL
Leukocytes, UA: NEGATIVE
Nitrite: NEGATIVE
PH: 6 (ref 5.0–8.0)
Protein, ur: NEGATIVE mg/dL
SPECIFIC GRAVITY, URINE: 1.016 (ref 1.005–1.030)

## 2016-05-24 LAB — COMPREHENSIVE METABOLIC PANEL
ALT: 72 U/L — ABNORMAL HIGH (ref 14–54)
AST: 51 U/L — AB (ref 15–41)
Albumin: 3.7 g/dL (ref 3.5–5.0)
Alkaline Phosphatase: 50 U/L (ref 38–126)
Anion gap: 7 (ref 5–15)
BUN: 15 mg/dL (ref 6–20)
CALCIUM: 8.8 mg/dL — AB (ref 8.9–10.3)
CHLORIDE: 105 mmol/L (ref 101–111)
CO2: 26 mmol/L (ref 22–32)
CREATININE: 0.52 mg/dL (ref 0.44–1.00)
GFR calc non Af Amer: >60 mL/min (ref >60)
Glucose, Bld: 89 mg/dL (ref 65–99)
Potassium: 3.7 mmol/L (ref 3.5–5.1)
SODIUM: 138 mmol/L (ref 135–145)
Total Bilirubin: 0.7 mg/dL (ref 0.3–1.2)
Total Protein: 6.6 g/dL (ref 6.5–8.1)

## 2016-05-24 LAB — PREGNANCY, URINE: PREG TEST UR: NEGATIVE

## 2016-05-24 LAB — LIPASE, BLOOD: Lipase: 41 U/L (ref 11–51)

## 2016-05-24 MED ORDER — SODIUM CHLORIDE 0.9 % IV SOLN: Freq: Once | INTRAVENOUS | Status: DC

## 2016-05-24 MED ORDER — METRONIDAZOLE IN NACL 5-0.79 MG/ML-% IV SOLN
500.0000 mg | Freq: Once | INTRAVENOUS | Status: AC
  Administered 2016-05-24: 500 mg via INTRAVENOUS
  Filled 2016-05-24: qty 100

## 2016-05-24 MED ORDER — HEPARIN SOD (PORK) LOCK FLUSH 100 UNIT/ML IV SOLN
INTRAVENOUS | Status: AC
  Filled 2016-05-24: qty 5

## 2016-05-24 MED ORDER — HYDROMORPHONE HCL 1 MG/ML IJ SOLN
1.0000 mg | Freq: Once | INTRAMUSCULAR | Status: AC
  Administered 2016-05-24: 1 mg via INTRAVENOUS
  Filled 2016-05-24: qty 1

## 2016-05-24 MED ORDER — CIPROFLOXACIN IN D5W 400 MG/200ML IV SOLN
400.0000 mg | Freq: Once | INTRAVENOUS | Status: AC
  Administered 2016-05-24: 400 mg via INTRAVENOUS
  Filled 2016-05-24: qty 200

## 2016-05-24 MED ORDER — IOPAMIDOL (ISOVUE-300) INJECTION 61%
100.0000 mL | Freq: Once | INTRAVENOUS | Status: AC | PRN
  Administered 2016-05-24: 100 mL via INTRAVENOUS

## 2016-05-24 MED ORDER — PROMETHAZINE HCL 25 MG PO TABS: 25.0000 mg | ORAL_TABLET | Freq: Four times a day (QID) | ORAL | 0 refills | Status: DC | PRN

## 2016-05-24 MED ORDER — CIPROFLOXACIN HCL 500 MG PO TABS: 500.0000 mg | ORAL_TABLET | Freq: Two times a day (BID) | ORAL | 0 refills | Status: DC

## 2016-05-24 MED ORDER — PROMETHAZINE HCL 25 MG/ML IJ SOLN
25.0000 mg | Freq: Once | INTRAMUSCULAR | Status: AC
  Administered 2016-05-24: 25 mg via INTRAVENOUS
  Filled 2016-05-24: qty 1

## 2016-05-24 MED ORDER — HYDROCODONE-ACETAMINOPHEN 5-325 MG PO TABS
2.0000 | ORAL_TABLET | Freq: Once | ORAL | Status: AC
  Administered 2016-05-24: 2 via ORAL
  Filled 2016-05-24: qty 2

## 2016-05-24 MED ORDER — HYDROCODONE-ACETAMINOPHEN 5-325 MG PO TABS: 1.0000 | ORAL_TABLET | Freq: Four times a day (QID) | ORAL | 0 refills | Status: DC | PRN

## 2016-05-24 MED ORDER — METRONIDAZOLE 500 MG PO TABS: 500.0000 mg | ORAL_TABLET | Freq: Two times a day (BID) | ORAL | 0 refills | Status: DC

## 2016-05-24 MED ORDER — ONDANSETRON HCL 4 MG/2ML IJ SOLN
4.0000 mg | Freq: Once | INTRAMUSCULAR | Status: AC
  Administered 2016-05-24: 4 mg via INTRAVENOUS
  Filled 2016-05-24: qty 2

## 2016-05-24 NOTE — Telephone Encounter (Signed)
Patient c/o continued pain and discomfort. She has not had a bowel movement since Tuesday. Her pain feels like 'gas pains' but she has not found any relief with any interventions. She has tried to eat, but has vomited.   Spoke with Dr Marin Olp and he would like to patient to come to the [facility] ED for workup for possible obstruction or other cause for her pain and vomiting. She understands.

## 2016-05-24 NOTE — Telephone Encounter (Signed)
Patient calling to say she is having a lot of gas. States she had diarrhea after her chemo on Tuesday, but not since and has not been eating very much. Denies fever or other signs and symptoms of infection. Suggested gas-x for now if no improvement, call back.

## 2016-05-24 NOTE — ED Provider Notes (Signed)
[place] DEPT [facility] Provider Note   CSN: [id number] Arrival date & time: 05/24/16  1629     History   Chief Complaint Chief Complaint  Patient presents with  . Abdominal Pain    HPI Carla Mills is a 46 y.o. female.  Patient with PMH of stage 2 colon cancer, currently being treated by heme/onc at [facility], presents emergency department with a chief complaint of abdominal pain. She reports gradually worsening abdominal pain over the past couple of days. She reports some associated nausea, vomiting, and diarrhea. She denies any fevers or chills.  She reports that her abdominal pain is a 7/10.  She has not taken anything for her symptoms.     The history is provided by the patient. No language interpreter was used.    Past Medical History:  Diagnosis Date  . 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 Colonic cancer ([place]) 03/09/2015  . History of chemotherapy    completed on 09/12/2015   . PONV (postoperative nausea and vomiting)   . Rectal mass 02/20/15   Rectal/sigmoid mass    Patient Active Problem List   Diagnosis Date Noted  . 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 Colonic cancer ([place]) 03/09/2015  . Colonic diverticular abscess 02/10/2015  . Constipation 02/10/2015  . Anemia, iron deficiency 02/10/2015  . Liver lesion 02/10/2015  . ACUTE PHARYNGITIS 12/23/2009  . VIRAL URI 12/23/2009    Past Surgical History:  Procedure Laterality Date  . APPENDECTOMY  02/20/15   Abnormal appearing  . CESAREAN SECTION     x 2  . COLONOSCOPY N/A 10/09/2015   Procedure: COLONOSCOPY;  Surgeon: Leighton Ruff, MD;  [facility];  Service: Endoscopy;  Laterality: N/A;  . COLOSTOMY  02/20/15   Sigmoid/rectal mass  . COLOSTOMY N/A 02/20/2015   Procedure: COLOSTOMY;  Surgeon: [name], MD;  Location: [place];  Service: General;   Laterality: N/A;  . COLOSTOMY REVERSAL N/A 11/15/2015   Procedure: COLOSTOMY REVERSAL;  Surgeon: [name], MD;  [facility];  Service: General;  Laterality: N/A;  . EXPLORATORY LAPAROTOMY  02/20/15   Sigmoid/rectal mass  . FLEXIBLE SIGMOIDOSCOPY N/A 02/15/2015   Procedure: FLEXIBLE SIGMOIDOSCOPY;  Surgeon: [name], MD;  Location: [facility] ENDOSCOPY;  Service: Endoscopy;  Laterality: N/A;  . IR GENERIC HISTORICAL  04/08/2016   IR FLUORO GUIDE PORT INSERTION RIGHT 04/08/2016 [name], MD [facility]  . IR GENERIC HISTORICAL  04/08/2016   IR US GUIDE VASC ACCESS RIGHT 04/08/2016 [name], MD [facility]  . LAPAROSCOPIC LYSIS OF ADHESIONS N/A 11/15/2015   Procedure: LAPAROSCOPIC LYSIS OF ADHESIONS;  Surgeon: [name], MD;  [facility];  Service: General;  Laterality: N/A;  . LAPAROSCOPY N/A 02/20/2015   Procedure: LAPAROSCOPY DIAGNOSTIC;  Surgeon: [name], MD;  Location: [place];  Service: General;  Laterality: N/A;  . LAPAROSCOPY ABDOMEN DIAGNOSTIC  02/20/15   Converted to open - Sigmoid/rectal mass  . LAPAROTOMY N/A 02/20/2015   Procedure: EXPLORATORY LAPAROTOMY AND PARTIAL COLECTOMY;  Surgeon: [name], MD;  Location: [place];  Service: General;  Laterality: N/A;  . LOW ANTERIOR BOWEL RESECTION  02/20/15   Sigmoid/rectal mass  . OSTEOTOMY     reconstructed coccyx   . reconstructed nerve in ankle     . TONSILLECTOMY      OB History    No data available  Home Medications    Prior to Admission medications   Medication Sig Start Date End Date Taking? Authorizing Provider  acetaminophen (TYLENOL) 500 MG tablet Take 1,000 mg by mouth every 8 (eight) hours as needed.    Historical Provider, MD  ciprofloxacin (CIPRO) 500 MG tablet Take 1 tablet (500 mg total) by mouth 2 (two) times daily. 05/24/16   Montine Circle, PA-C  dexamethasone (DECADRON) 4 MG tablet Take 2 tablets (8 mg total) by mouth daily. Start the day after chemo and take for  2 days. 04/09/16   Volanda Napoleon, MD  fexofenadine (ALLEGRA) 180 MG tablet Take 180 mg by mouth daily as needed for allergies.     Historical Provider, MD  fluticasone (FLONASE) 50 MCG/ACT nasal spray Place into both nostrils 2 (two) times daily as needed for allergies or rhinitis.    Historical Provider, MD  HYDROcodone-acetaminophen (NORCO/VICODIN) 5-325 MG tablet Take 1-2 tablets by mouth every 6 (six) hours as needed. 05/24/16   Montine Circle, PA-C  ibuprofen (ADVIL,MOTRIN) 200 MG tablet Take 400 mg by mouth every 6 (six) hours as needed for headache or mild pain.    Historical Provider, MD  lidocaine-prilocaine (EMLA) cream Apply 1 application topically as needed. Apply to [facility] 1 hour prior to treatment. 04/09/16   Volanda Napoleon, MD  loperamide (IMODIUM) 2 MG capsule Take 1 capsule (2 mg total) by mouth as needed for diarrhea or loose stools. Take 2 capsules at the onset of diarrhea, then 1 every 2 hours until 12 hours with no BM. May take 2 every 4 hours at night. If diarrhea recurrs, repeat. 04/09/16   Volanda Napoleon, MD  LORazepam (ATIVAN) 1 MG tablet Take 1 tablet (1 mg total) by mouth every 6 (six) hours as needed for anxiety. Prn nausea/vomiting 04/09/16   Volanda Napoleon, MD  metroNIDAZOLE (FLAGYL) 500 MG tablet Take 1 tablet (500 mg total) by mouth 2 (two) times daily. 05/24/16   Montine Circle, PA-C  ondansetron (ZOFRAN) 8 MG tablet Take 1 tablet (8 mg total) by mouth 2 (two) times daily. Start Day 3 after chemo. 04/09/16   Volanda Napoleon, MD  prochlorperazine (COMPAZINE) 10 MG tablet Take 1 tablet (10 mg total) by mouth every 6 (six) hours as needed for nausea or vomiting. 04/09/16   Volanda Napoleon, MD  promethazine (PHENERGAN) 25 MG tablet Take 1 tablet (25 mg total) by mouth every 6 (six) hours as needed for nausea or vomiting. 05/24/16   Montine Circle, PA-C  traMADol (ULTRAM) 50 MG tablet Take 1 tablet (50 mg total) by mouth every 6 (six) hours as needed. 03/15/16   Volanda Napoleon, MD      Family History History reviewed. No pertinent family history.  Social History Social History  Substance Use Topics  . Smoking status: Never Smoker  . Smokeless tobacco: Never Used  . Alcohol use 0.0 oz/week     Comment: socially      Allergies   Bactrim [sulfamethoxazole-trimethoprim]; Oxaprozin; Penicillins; and Sulfonamide derivatives   Review of Systems Review of Systems  All other systems reviewed and are negative.    Physical Exam Updated Vital Signs BP 119/86   Pulse 68   Temp 98.3 F (36.8 C) (Oral)   Resp 16   Ht 5\' 5"  (1.651 m)   Wt 68 kg   SpO2 99%   BMI 24.96 kg/m   Physical Exam  Constitutional: She is oriented to person, place, and time. She appears well-developed and  well-nourished.  HENT:  Head: Normocephalic and atraumatic.  Eyes: Conjunctivae and EOM are normal. Pupils are equal, round, and reactive to light.  Neck: Normal range of motion. Neck supple.  Cardiovascular: Normal rate and regular rhythm.  Exam reveals no gallop and no friction rub.   No murmur heard. Pulmonary/Chest: Effort normal and breath sounds normal. No respiratory distress. She has no wheezes. She has no rales. She exhibits no tenderness.  Abdominal: Soft. Bowel sounds are normal. She exhibits no distension and no mass. There is tenderness. There is no rebound and no guarding.  Generalized abdominal tenderness  Musculoskeletal: Normal range of motion. She exhibits no edema or tenderness.  Neurological: She is alert and oriented to person, place, and time.  Skin: Skin is warm and dry.  Psychiatric: She has a normal mood and affect. Her behavior is normal. Judgment and thought content normal.  Nursing note and vitals reviewed.    ED Treatments / Results  Labs (all labs ordered are listed, but only abnormal results are displayed) Labs Reviewed  COMPREHENSIVE METABOLIC PANEL - Abnormal; Notable for the following:       Result Value   Calcium 8.8 (*)    AST 51 (*)     ALT 72 (*)    All other components within normal limits  CBC WITH DIFFERENTIAL/PLATELET  LIPASE, BLOOD  URINALYSIS, ROUTINE W REFLEX MICROSCOPIC  PREGNANCY, URINE    EKG  EKG Interpretation None       Radiology Ct Abdomen Pelvis W Contrast  Result Date: 05/24/2016 CLINICAL DATA:  46 y/o F; recurrent colon cancer. Abdominal pain. History of appendectomy. EXAM: CT ABDOMEN AND PELVIS WITH CONTRAST TECHNIQUE: Multidetector CT imaging of the abdomen and pelvis was performed using the standard protocol following bolus administration of intravenous contrast. CONTRAST:  178mL ISOVUE-300 IOPAMIDOL (ISOVUE-300) INJECTION 61% COMPARISON:  04/12/2016 PET-CT. FINDINGS: Lower chest: No acute abnormality. Hepatobiliary: Stable subcentimeter lucencies in the left lobe of liver near gallbladder fossa probably representing hepatic cysts. No new focal liver lesion. Normal gallbladder. No intra or extrahepatic biliary ductal dilatation. Pancreas: Unremarkable. No pancreatic ductal dilatation or surrounding inflammatory changes. Spleen: Stable 3 mm lucency in the upper spleen, probably cysts. No new splenic lesion. Adrenals/Urinary Tract: Normal adrenal glands. Normal kidneys. No hydronephrosis. Normal bladder. Stomach/Bowel: Soft tissue mass at the colorectal anastomosis is markedly decreased in size with minimal residual soft tissue thickening. Stable subcentimeter lymph node and left-sided pericolonic fat (series 2, image 75). Peritoneal implant in right lower quadrant is decreased in size measuring 9 x 22 mm, previously 25 x 32 mm. Peritoneal implant in the left upper quadrant is decreased in size measuring 15 x 7 mm, previously 30 x 20 mm. The left pericolic gutter peritoneal implant is no longer appreciated. There is wall thickening of the colon involving splenic flexure and descending segment with mild surrounding edema and trace fluid in the left pericolic gutter compatible with colitis. Vascular/Lymphatic: No  significant vascular findings are present. No enlarged abdominal or pelvic lymph nodes. Reproductive: Uterus and bilateral adnexa are unremarkable. Other: No abdominal wall hernia or abnormality. No abdominopelvic ascites. Musculoskeletal: No acute or significant osseous findings. IMPRESSION: 1. Acute colitis involving splenic flexure and descending segment with mild surrounding edema and trace fluid in the pericolic gutter. No evidence for perforation or abscess. 2. Mass at the colorectal anastomosis and peritoneal implants are decreased in size or no longer appreciable. No new metastatic disease identified. Electronically Signed   By: Edgardo Roys.D.  On: 05/24/2016 19:17    Procedures Procedures (including critical care time)  Medications Ordered in ED Medications  HYDROmorphone (DILAUDID) injection 1 mg (1 mg Intravenous Given 05/24/16 1709)  promethazine (PHENERGAN) injection 25 mg (25 mg Intravenous Given 05/24/16 1705)  iopamidol (ISOVUE-300) 61 % injection 100 mL (100 mLs Intravenous Contrast Given 05/24/16 1850)  ciprofloxacin (CIPRO) IVPB 400 mg (0 mg Intravenous Stopped 05/24/16 2142)  metroNIDAZOLE (FLAGYL) IVPB 500 mg (0 mg Intravenous Stopped 05/24/16 2038)  HYDROmorphone (DILAUDID) injection 1 mg (1 mg Intravenous Given 05/24/16 1951)  ondansetron (ZOFRAN) injection 4 mg (4 mg Intravenous Given 05/24/16 1950)  HYDROcodone-acetaminophen (NORCO/VICODIN) 5-325 MG per tablet 2 tablet (2 tablets Oral Given 05/24/16 2157)     Initial Impression / Assessment and Plan / ED Course  I have reviewed the triage vital signs and the nursing notes.  Pertinent labs & imaging results that were available during my care of the patient were reviewed by me and considered in my medical decision making (see chart for details).     Patient with abdominal pain. She has history of colon cancer. She denies fever. In her history, we'll need to rule out small bowel obstruction or other acute  intra-abdominal process.  Laboratory workup is reassuring.  CT is remarkable for colitis.  We had originally talked about treating the patient on an outpatient basis.  The patient states that she would prefer this, however, she has been unable to tolerate PO.  She has had multiple episodes of vomiting, despite receiving several doses of varying antiemetics.    Dr. Sherry Ruffing agrees with plan for hospitalist consultation.  Appreciate Dr. Eulas Post for accepting the patient for observation in the hospital at [facility] long.  Final Clinical Impressions(s) / ED Diagnoses   Final diagnoses:  Colitis    New Prescriptions New Prescriptions   CIPROFLOXACIN (CIPRO) 500 MG TABLET    Take 1 tablet (500 mg total) by mouth 2 (two) times daily.   HYDROCODONE-ACETAMINOPHEN (NORCO/VICODIN) 5-325 MG TABLET    Take 1-2 tablets by mouth every 6 (six) hours as needed.   METRONIDAZOLE (FLAGYL) 500 MG TABLET    Take 1 tablet (500 mg total) by mouth 2 (two) times daily.   PROMETHAZINE (PHENERGAN) 25 MG TABLET    Take 1 tablet (25 mg total) by mouth every 6 (six) hours as needed for nausea or vomiting.     Montine Circle, PA-C 05/25/16 0000    Gwenyth Allegra Tegeler, MD 05/25/16 [phone]

## 2016-05-24 NOTE — ED Notes (Signed)
CT notified pt has completed oral contrast.  

## 2016-05-24 NOTE — ED Triage Notes (Signed)
Patient reports that she has had abdominal pain since eating this am. THe patient Reports N/V/D after chemo Tues. And LBM on Wed  - patient denies any nausea at this time.

## 2016-05-24 NOTE — ED Notes (Signed)
PT returned from CT

## 2016-05-24 NOTE — Progress Notes (Signed)
Place in observation, med surg bed for intractable nausea, vomiting, and abdominal pain.  CT A/P shows acute colitis.  No leukocytosis.  No fever.  Empiric cipro and flagyl started in the ED.  NS at 100cc/hr.  NPO status for now.  Patient has a history of colon cancer; followed by Dr. Marin Olp.

## 2016-05-24 NOTE — ED Notes (Signed)
In to d/c patient and she vomited a large amount of clear emesis. PA notified, in to pt bedside for reeval at this time.

## 2016-05-25 DIAGNOSIS — C189 Malignant neoplasm of colon, unspecified: Secondary | ICD-10-CM

## 2016-05-25 DIAGNOSIS — F411 Generalized anxiety disorder: Secondary | ICD-10-CM | POA: Diagnosis present

## 2016-05-25 DIAGNOSIS — R112 Nausea with vomiting, unspecified: Secondary | ICD-10-CM | POA: Diagnosis present

## 2016-05-25 DIAGNOSIS — C7989 Secondary malignant neoplasm of other specified sites: Secondary | ICD-10-CM | POA: Diagnosis not present

## 2016-05-25 DIAGNOSIS — D709 Neutropenia, unspecified: Secondary | ICD-10-CM | POA: Diagnosis not present

## 2016-05-25 DIAGNOSIS — K529 Noninfective gastroenteritis and colitis, unspecified: Secondary | ICD-10-CM | POA: Diagnosis not present

## 2016-05-25 LAB — CBC
HEMATOCRIT: 35.4 % — AB (ref 36.0–46.0)
Hemoglobin: 11.7 g/dL — ABNORMAL LOW (ref 12.0–15.0)
MCH: 28.3 pg (ref 26.0–34.0)
MCHC: 33.1 g/dL (ref 30.0–36.0)
MCV: 85.7 fL (ref 78.0–100.0)
Platelets: 187 10*3/uL (ref 150–400)
RBC: 4.13 MIL/uL (ref 3.87–5.11)
RDW: 13.3 % (ref 11.5–15.5)
WBC: 4.9 10*3/uL (ref 4.0–10.5)

## 2016-05-25 LAB — BASIC METABOLIC PANEL
ANION GAP: 5 (ref 5–15)
BUN: 13 mg/dL (ref 6–20)
CALCIUM: 8.6 mg/dL — AB (ref 8.9–10.3)
CO2: 27 mmol/L (ref 22–32)
Chloride: 106 mmol/L (ref 101–111)
Creatinine, Ser: 0.62 mg/dL (ref 0.44–1.00)
GLUCOSE: 78 mg/dL (ref 65–99)
POTASSIUM: 3.8 mmol/L (ref 3.5–5.1)
Sodium: 138 mmol/L (ref 135–145)

## 2016-05-25 LAB — HIV ANTIBODY (ROUTINE TESTING W REFLEX): HIV SCREEN 4TH GENERATION: NONREACTIVE

## 2016-05-25 MED ORDER — ACETAMINOPHEN 325 MG PO TABS: 650.0000 mg | ORAL_TABLET | Freq: Four times a day (QID) | ORAL | Status: DC | PRN

## 2016-05-25 MED ORDER — ACETAMINOPHEN 650 MG RE SUPP: 650.0000 mg | Freq: Four times a day (QID) | RECTAL | Status: DC | PRN

## 2016-05-25 MED ORDER — DIPHENHYDRAMINE HCL 25 MG PO CAPS: 25.0000 mg | ORAL_CAPSULE | Freq: Four times a day (QID) | ORAL | Status: DC | PRN

## 2016-05-25 MED ORDER — HYDROMORPHONE HCL 1 MG/ML IJ SOLN: 0.5000 mg | INTRAMUSCULAR | Status: DC | PRN

## 2016-05-25 MED ORDER — HYDROMORPHONE HCL 1 MG/ML IJ SOLN: 1.0000 mg | INTRAMUSCULAR | Status: DC | PRN

## 2016-05-25 MED ORDER — LORAZEPAM 2 MG/ML IJ SOLN: 1.0000 mg | Freq: Four times a day (QID) | INTRAMUSCULAR | Status: DC | PRN

## 2016-05-25 MED ORDER — CIPROFLOXACIN IN D5W 400 MG/200ML IV SOLN
400.0000 mg | Freq: Two times a day (BID) | INTRAVENOUS | Status: DC
  Administered 2016-05-25 – 2016-05-26 (×3): 400 mg via INTRAVENOUS
  Filled 2016-05-25 (×3): qty 200

## 2016-05-25 MED ORDER — METRONIDAZOLE IN NACL 5-0.79 MG/ML-% IV SOLN
500.0000 mg | Freq: Three times a day (TID) | INTRAVENOUS | Status: DC
  Administered 2016-05-25 – 2016-05-26 (×5): 500 mg via INTRAVENOUS
  Filled 2016-05-25 (×5): qty 100

## 2016-05-25 MED ORDER — ONDANSETRON HCL 4 MG PO TABS: 4.0000 mg | ORAL_TABLET | Freq: Four times a day (QID) | ORAL | Status: DC | PRN

## 2016-05-25 MED ORDER — ONDANSETRON HCL 4 MG/2ML IJ SOLN: 4.0000 mg | Freq: Four times a day (QID) | INTRAMUSCULAR | Status: DC | PRN

## 2016-05-25 MED ORDER — ENOXAPARIN SODIUM 40 MG/0.4ML ~~LOC~~ SOLN
40.0000 mg | Freq: Every day | SUBCUTANEOUS | Status: DC
  Administered 2016-05-25: 40 mg via SUBCUTANEOUS
  Filled 2016-05-25 (×2): qty 0.4

## 2016-05-25 MED ORDER — FLUTICASONE PROPIONATE 50 MCG/ACT NA SUSP
1.0000 | Freq: Two times a day (BID) | NASAL | Status: DC
  Administered 2016-05-25 – 2016-05-26 (×3): 1 via NASAL
  Filled 2016-05-25: qty 16

## 2016-05-25 MED ORDER — LORATADINE 10 MG PO TABS
10.0000 mg | ORAL_TABLET | Freq: Every day | ORAL | Status: DC
  Administered 2016-05-25 – 2016-05-26 (×2): 10 mg via ORAL
  Filled 2016-05-25 (×2): qty 1

## 2016-05-25 MED ORDER — ALBUTEROL SULFATE (2.5 MG/3ML) 0.083% IN NEBU: 2.5000 mg | INHALATION_SOLUTION | RESPIRATORY_TRACT | Status: DC | PRN

## 2016-05-25 MED ORDER — SODIUM CHLORIDE 0.9 % IV SOLN
INTRAVENOUS | Status: DC
  Administered 2016-05-25: 18:00:00 via INTRAVENOUS

## 2016-05-25 MED ORDER — PROCHLORPERAZINE EDISYLATE 5 MG/ML IJ SOLN: 10.0000 mg | INTRAMUSCULAR | Status: DC | PRN

## 2016-05-25 NOTE — Progress Notes (Signed)
Patient seen and evaluated, chart reviewed, please see EMR for updated orders. Please see full H&P dictated by admitting physician for same date of service.   1)Colitis- ??? Etiology, continue Cipro and Flagyl pending cultures, no leukocytosis or leukopenia, no fevers no chills, clear liquid diet as tolerated  2) metastatic colon cancer-CT abdomen and pelvis suggested significant reduction in peritoneal metastatic disease  3)Rhinitis/postnasal drainage-Claritin and Flonase as ordered   Plan of care discussed with patient and husband at bedside, questions answered  Please see consult note from oncologist Dr Burney Gauze  Patient seen and evaluated, chart reviewed, please see EMR for updated orders. Please see full H&P dictated by admitting physician for same date of service.

## 2016-05-25 NOTE — Consult Note (Signed)
Referral MD  Reason for Referral: Localized colitis; recurrent colon cancer   Chief Complaint  Patient presents with  . Abdominal Pain  : I had severe abdominal pain and vomiting.  HPI: Carla Mills is well-known to me. She is very charming 46 year old white female. She doesn't present with stage II colon cancer. She had this resected. She underwent adjuvant chemotherapy with Xeloda. However, about 5 months later, she recurred with metastatic disease.  She has been on treatment with FOLFOXIRI. She's done well with this. She had her last cycle, which was her fourth cycle, last week.  Yesterday, she developed acute onset of abdominal pain. This is on the right side. She has some vomiting. She had no fever. She had no diarrhea. She had no constipation. She had no dysuria.  She went to the emergency room. She had a CT scan done. She had a very nice decrease in her peritoneal disease. She had marked decrease in the soft tissue mass at the anastomosis. She was noted to have some wall thickening of the colon involving the splenic flexure and descending colon. This appear to be compatible with colitis.  She is not neutropenic. Her white counts 4.9.  She's been started on IV antibiotics. She is feeling better. She is still nothing by mouth.   Past Medical History:  Diagnosis Date  . Cancer of sigmoid colon metastatic to intra-abdominal lymph node ([place]) 04/03/2016  . Colonic cancer ([place]) 03/09/2015  . History of chemotherapy    completed on 09/12/2015   . PONV (postoperative nausea and vomiting)   . Rectal mass 02/20/15   Rectal/sigmoid mass  :  Past Surgical History:  Procedure Laterality Date  . APPENDECTOMY  02/20/15   Abnormal appearing  . CESAREAN SECTION     x 2  . COLONOSCOPY N/A 10/09/2015   Procedure: COLONOSCOPY;  Surgeon: Leighton Ruff, MD;  [facility];  Service: Endoscopy;  Laterality: N/A;  . COLOSTOMY  02/20/15   Sigmoid/rectal mass  . COLOSTOMY N/A 02/20/2015    Procedure: COLOSTOMY;  Surgeon: [name], MD;  Location: [place];  Service: General;  Laterality: N/A;  . COLOSTOMY REVERSAL N/A 11/15/2015   Procedure: COLOSTOMY REVERSAL;  Surgeon: [name], MD;  [facility];  Service: General;  Laterality: N/A;  . EXPLORATORY LAPAROTOMY  02/20/15   Sigmoid/rectal mass  . FLEXIBLE SIGMOIDOSCOPY N/A 02/15/2015   Procedure: FLEXIBLE SIGMOIDOSCOPY;  Surgeon: [name], MD;  Location: [facility] ENDOSCOPY;  Service: Endoscopy;  Laterality: N/A;  . IR GENERIC HISTORICAL  04/08/2016   IR FLUORO GUIDE PORT INSERTION RIGHT 04/08/2016 [name], MD [facility]INTER[facility]  . IR GENERIC HISTORICAL  04/08/2016   IR US GUIDE VASC ACCESS RIGHT 04/08/2016 [name], MD [facility]  . LAPAROSCOPIC LYSIS OF ADHESIONS N/A 11/15/2015   Procedure: LAPAROSCOPIC LYSIS OF ADHESIONS;  Surgeon: [name], MD;  [facility];  Service: General;  Laterality: N/A;  . LAPAROSCOPY N/A 02/20/2015   Procedure: LAPAROSCOPY DIAGNOSTIC;  Surgeon: [name], MD;  Location: [place];  Service: General;  Laterality: N/A;  . LAPAROSCOPY ABDOMEN DIAGNOSTIC  02/20/15   Converted to open - Sigmoid/rectal mass  . LAPAROTOMY N/A 02/20/2015   Procedure: EXPLORATORY LAPAROTOMY AND PARTIAL COLECTOMY;  Surgeon: [name], MD;  Location: [place];  Service: General;  Laterality: N/A;  . LOW ANTERIOR BOWEL RESECTION  02/20/15   Sigmoid/rectal mass  . OSTEOTOMY     reconstructed coccyx   . reconstructed nerve in ankle     .  TONSILLECTOMY    :   Current Facility-Administered Medications:  .  0.9 %  sodium chloride infusion, , Intravenous, Once, Montine Circle, PA-C .  acetaminophen (TYLENOL) tablet 650 mg, 650 mg, Oral, Q6H PRN **OR** acetaminophen (TYLENOL) suppository 650 mg, 650 mg, Rectal, Q6H PRN, Norval Morton, MD .  albuterol (PROVENTIL) (2.5 MG/3ML) 0.083% nebulizer solution 2.5 mg, 2.5 mg, Nebulization, Q2H PRN, Norval Morton, MD .  ciprofloxacin (CIPRO) IVPB  400 mg, 400 mg, Intravenous, Q12H, Rondell A Smith, MD .  enoxaparin (LOVENOX) injection 40 mg, 40 mg, Subcutaneous, QHS, Rondell A Smith, MD, 40 mg at 05/25/16 0421 .  heparin lock flush 100 UNIT/ML injection, , , ,  .  HYDROmorphone (DILAUDID) injection 0.5 mg, 0.5 mg, Intravenous, Q2H PRN, Ritta Slot, NP .  LORazepam (ATIVAN) injection 1 mg, 1 mg, Intravenous, Q6H PRN, Ritta Slot, NP .  metroNIDAZOLE (FLAGYL) IVPB 500 mg, 500 mg, Intravenous, Q8H, Rondell A Smith, MD, 500 mg at 05/25/16 0422 .  ondansetron (ZOFRAN) tablet 4 mg, 4 mg, Oral, Q6H PRN **OR** ondansetron (ZOFRAN) injection 4 mg, 4 mg, Intravenous, Q6H PRN, Norval Morton, MD .  prochlorperazine (COMPAZINE) injection 10 mg, 10 mg, Intravenous, Q4H PRN, Norval Morton, MD  Facility-Administered Medications Ordered in Other Encounters:  .  clindamycin (CLEOCIN) 900 mg in dextrose 5 % 50 mL IVPB, 900 mg, Intravenous, 60 min Pre-Op **AND** gentamicin (GARAMYCIN) 340 mg in dextrose 5 % 50 mL IVPB, 5 mg/kg, Intravenous, 60 min Pre-Op, [name], MD .  dextrose 5 % solution, , Intravenous, Continuous, Volanda Napoleon, MD, Stopped at 04/09/16 1418 .  heparin lock flush 100 unit/mL, 500 Units, Intracatheter, Once PRN, Volanda Napoleon, MD .  sodium chloride flush (NS) 0.9 % injection 10 mL, 10 mL, Intracatheter, PRN, Volanda Napoleon, MD:  . sodium chloride   Intravenous Once  . ciprofloxacin  400 mg Intravenous Q12H  . enoxaparin (LOVENOX) injection  40 mg Subcutaneous QHS  . heparin lock flush      . metronidazole  500 mg Intravenous Q8H  :  Allergies  Allergen Reactions  . Bactrim [Sulfamethoxazole-Trimethoprim]   . Oxaprozin Hives    REACTION: Hives  . Penicillins Hives    REACTION: Hives Has patient had a PCN reaction causing immediate rash, facial/tongue/throat swelling, SOB or lightheadedness with hypotension: no Has patient had a PCN reaction causing severe rash involving mucus membranes or skin necrosis: {no Has  patient had a PCN reaction that required hospitalization no Has patient had a PCN reaction occurring within the last 10 years: no If all of the above answers are "NO", then may proceed with Cephalosporin use.  . Sulfonamide Derivatives Swelling    Massive extremity swelling   :  History reviewed. No pertinent family history.:  Social History   Social History  . Marital status: Married    Spouse name: N/A  . Number of children: N/A  . Years of education: N/A   Occupational History  . Not on file.   Social History Main Topics  . Smoking status: Never Smoker  . Smokeless tobacco: Never Used  . Alcohol use 0.0 oz/week     Comment: socially   . Drug use: No  . Sexual activity: Not on file   Other Topics Concern  . Not on file   Social History Narrative  . No narrative on file  :  Pertinent items are noted in HPI.  Exam: Patient Vitals for the past 24  hrs:  BP Temp Temp src Pulse Resp SpO2 Height Weight  05/25/16 0507 105/66 98 F (36.7 C) Oral 73 16 99 % - -  05/25/16 0101 126/84 98.2 F (36.8 C) Oral 67 16 99 % 5\' 5"  (1.651 m) 152 lb 1.9 oz (69 kg)  05/25/16 0023 112/78 98.5 F (36.9 C) - 65 18 99 % - -  05/24/16 2330 112/78 - - (!) 56 - 97 % - -  05/24/16 2245 122/88 - - 68 - 97 % - -  05/24/16 2235 126/91 - - 65 - 96 % - -  05/24/16 2130 119/86 - - 68 16 99 % - -  05/24/16 2045 118/84 - - (!) 58 - 100 % - -  05/24/16 2028 112/80 - - 62 18 99 % - -  05/24/16 2000 123/83 - - 62 - 95 % - -  05/24/16 1945 143/97 - - 75 20 100 % - -  05/24/16 1912 128/89 - - 60 18 100 % - -  05/24/16 1634 114/92 98.3 F (36.8 C) Oral 85 18 99 % 5\' 5"  (1.651 m) 150 lb (68 kg)    As above    Recent Labs  05/24/16 1648 05/25/16 0425  WBC 5.8 4.9  HGB 12.8 11.7*  HCT 37.1 35.4*  PLT 198 187    Recent Labs  05/24/16 1648 05/25/16 0425  NA 138 138  K 3.7 3.8  CL 105 106  CO2 26 27  GLUCOSE 89 78  BUN 15 13  CREATININE 0.52 0.62  CALCIUM 8.8* 8.6*    Blood smear  review:  None  Pathology: None     Assessment and Plan:  [name] is a 46 year old white female. She has metastatic colon cancer. This is improving nicely. She was due for a PET scan on Monday. I will have to delay this.  I'm not sure why she has this colitis. She is not on Avastin. Possibly, the chemotherapy could be the cause. I would think if that was the case, that she would've had this before.  Maybe, she had something to eat that could've triggered this.  She is looking good. The antibiotics are helping. I would think the antibiotics should be appropriate.  She and her husband are trying to go down to [place] on Wednesday to see her daughter play in a golf tournament. Hopefully they will be oh to make it down.  I don't see anything else that needs to be done. Again, she is on Flagyl and Cipro. I think the real key is going to be trying to get her back on nutrition. Maybe, she can start clear liquids today. I told her it might take a week or so before she can actually have solid food.  As always, she is in good spirits.  We will follow along. I very much appreciate all the outstanding effort by everybody up on 3 W.  Lattie Haw, MD  Darlyn Chamber 1:19

## 2016-05-25 NOTE — Progress Notes (Signed)
Pharmacy Antibiotic Note  Carla Mills is a 46 y.o. female admitted on 05/24/2016 with Intra-abdominal infection.  Pharmacy has been consulted for Ciprofloxacin dosing.  Plan: Ciprofloxacin 400mg  iv q12hr  Height: 5\' 5"  (165.1 cm) Weight: 152 lb 1.9 oz (69 kg) IBW/kg (Calculated) : 57  Temp (24hrs), Avg:98.3 F (36.8 C), Min:98.2 F (36.8 C), Max:98.5 F (36.9 C)   Recent Labs Lab 05/21/16 0904 05/21/16 0905 05/24/16 1648  WBC  --  7.1 5.8  CREATININE 0.7  --  0.52    Estimated Creatinine Clearance: 86.6 mL/min (by C-G formula based on SCr of 0.52 mg/dL).    Allergies  Allergen Reactions  . Bactrim [Sulfamethoxazole-Trimethoprim]   . Oxaprozin Hives    REACTION: Hives  . Penicillins Hives    REACTION: Hives Has patient had a PCN reaction causing immediate rash, facial/tongue/throat swelling, SOB or lightheadedness with hypotension: no Has patient had a PCN reaction causing severe rash involving mucus membranes or skin necrosis: {no Has patient had a PCN reaction that required hospitalization no Has patient had a PCN reaction occurring within the last 10 years: no If all of the above answers are "NO", then may proceed with Cephalosporin use.  . Sulfonamide Derivatives Swelling    Massive extremity swelling     Antimicrobials this admission: Ciprofloxacin 3/2 >> Flagyl 3/2 >>   Dose adjustments this admission: -  Microbiology results: pending  Thank you for allowing pharmacy to be a part of this patient's care.  Nani Skillern Crowford 05/25/2016 2:16 AM

## 2016-05-25 NOTE — Progress Notes (Signed)
Nursing Note:Received report on pt and assumed care.Waiting orders.Pt resting quietly in bed.Husband at the bedside.Pt rates her pain at a 1.Denies n/v.wbb

## 2016-05-25 NOTE — H&P (Signed)
History and Physical    Carla Mills [id number] DOB: 05-14-1970 DOA: 05/24/2016  Referring MD/NP/PA: Dr. Magdalene Patricia PCP: No PCP Per Patient  Patient coming from: [facility]  Chief Complaint: Abdominal pain with nausea and vomiting.  HPI: Carla Mills is a 46 y.o. female with medical history significant of metastatic colon cancer on chemotherapy followed by Dr. Marin Olp; who presents with complaints of abdominal pain with nausea and vomiting since yesterday morning. Patient reports that she just finished eating breakfast when she developed acute onset of achy abdominal pain that was intermittently sharp. Pain was located in the middle and right side of her abdomen. She called in to her oncologist's office and was advised to take Gas-X. Patient reports taking Gas-X as well as changing positions without any relief of symptoms. As the day proceeded patient felt pain was progressively worsening and rated it as a 5 out of 10 on the pain scale. Associated symptoms included nausea, vomiting despite oral antiemetics, diarrhea( earlier in the week), and nasal congestion. Patient reports that her colon cancer returned in 01/2016 and is being treated currently with chemotherapy. Reports complaining her fourth round of chemotherapy 2 days ago and her next infusion is scheduled for the 13th of this month. Denies any recent antibiotics, chest pain, shortness of breath, hematemesis, weight loss, or  rash. ED Course: Blood admission into the emergency department patient was seen to have vital signs within normal limits. Lab work was relatively unremarkable. CT abd shows acute colitis around splenic flexure and descending colon. Patient was started on IV ciprofloxacin, IV metronidazole, NS IV fluids at 100 ml/hr, antiemetics, and pain medication. Transfer to a MedSurg bed. Patient states that after oral pain medication that she continued to have vomiting despite antiemetics.  Review of Systems: As per HPI otherwise 10 point  review of systems negative.   Past Medical History:  Diagnosis Date  . 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 Colonic cancer ([place]) 03/09/2015  . History of chemotherapy    completed on 09/12/2015   . PONV (postoperative nausea and vomiting)   . Rectal mass 02/20/15   Rectal/sigmoid mass    Past Surgical History:  Procedure Laterality Date  . APPENDECTOMY  02/20/15   Abnormal appearing  . CESAREAN SECTION     x 2  . COLONOSCOPY N/A 10/09/2015   Procedure: COLONOSCOPY;  Surgeon: Leighton Ruff, MD;  Location: [facility];  Service: Endoscopy;  Laterality: N/A;  . COLOSTOMY  02/20/15   Sigmoid/rectal mass  . COLOSTOMY N/A 02/20/2015   Procedure: COLOSTOMY;  Surgeon: [name], MD;  Location: [place];  Service: General;  Laterality: N/A;  . COLOSTOMY REVERSAL N/A 11/15/2015   Procedure: COLOSTOMY REVERSAL;  Surgeon: [name], MD;  Location: [facility];  Service: General;  Laterality: N/A;  . EXPLORATORY LAPAROTOMY  02/20/15   Sigmoid/rectal mass  . FLEXIBLE SIGMOIDOSCOPY N/A 02/15/2015   Procedure: FLEXIBLE SIGMOIDOSCOPY;  Surgeon: [name], MD;  Location: [facility] END[facility];  Service: Endoscopy;  Laterality: N/A;  . IR GENERIC HISTORICAL  04/08/2016   IR FLUORO GUIDE PORT INSERTION RIGHT 04/08/2016 [name], MD [facility]  . IR GENERIC HISTORICAL  04/08/2016   IR US GUIDE VASC ACCESS RIGHT 04/08/2016 [name], MD [facility]  . LAPAROSCOPIC LYSIS OF ADHESIONS N/A 11/15/2015   Procedure: LAPAROSCOPIC LYSIS OF ADHESIONS;  Surgeon: [name], MD;  Location: [facility];  Service: General;  Laterality: N/A;  . LAPAROSCOPY N/A 02/20/2015   Procedure: LAPAROSCOPY DIAGNOSTIC;  Surgeon: [name], MD;  Location: [place];  Service: General;  Laterality: N/A;  . LAPAROSCOPY ABDOMEN DIAGNOSTIC  02/20/15   Converted to open - Sigmoid/rectal mass  . LAPAROTOMY N/A 02/20/2015   Procedure: EXPLORATORY LAPAROTOMY AND PARTIAL  COLECTOMY;  Surgeon: [name], MD;  Location: [place];  Service: General;  Laterality: N/A;  . LOW ANTERIOR BOWEL RESECTION  02/20/15   Sigmoid/rectal mass  . OSTEOTOMY     reconstructed coccyx   . reconstructed nerve in ankle     . TONSILLECTOMY       reports that she has never smoked. She has never used smokeless tobacco. She reports that she drinks alcohol. She reports that she does not use drugs.  Allergies  Allergen Reactions  . Bactrim [Sulfamethoxazole-Trimethoprim]   . Oxaprozin Hives    REACTION: Hives  . Penicillins Hives    REACTION: Hives Has patient had a PCN reaction causing immediate rash, facial/tongue/throat swelling, SOB or lightheadedness with hypotension: no Has patient had a PCN reaction causing severe rash involving mucus membranes or skin necrosis: {no Has patient had a PCN reaction that required hospitalization no Has patient had a PCN reaction occurring within the last 10 years: no If all of the above answers are "NO", then may proceed with Cephalosporin use.  . Sulfonamide Derivatives Swelling    Massive extremity swelling     History reviewed. No pertinent family history.  Prior to Admission medications   Medication Sig Start Date End Date Taking? Authorizing Provider  acetaminophen (TYLENOL) 500 MG tablet Take 1,000 mg by mouth every 8 (eight) hours as needed.    Historical Provider, MD  ciprofloxacin (CIPRO) 500 MG tablet Take 1 tablet (500 mg total) by mouth 2 (two) times daily. 05/24/16   Montine Circle, PA-C  dexamethasone (DECADRON) 4 MG tablet Take 2 tablets (8 mg total) by mouth daily. Start the day after chemo and take for 2 days. 04/09/16   Volanda Napoleon, MD  fexofenadine (ALLEGRA) 180 MG tablet Take 180 mg by mouth daily as needed for allergies.     Historical Provider, MD  fluticasone (FLONASE) 50 MCG/ACT nasal spray Place into both nostrils 2 (two) times daily as needed for allergies or rhinitis.    Historical Provider, MD    HYDROcodone-acetaminophen (NORCO/VICODIN) 5-325 MG tablet Take 1-2 tablets by mouth every 6 (six) hours as needed. 05/24/16   Montine Circle, PA-C  ibuprofen (ADVIL,MOTRIN) 200 MG tablet Take 400 mg by mouth every 6 (six) hours as needed for headache or mild pain.    Historical Provider, MD  lidocaine-prilocaine (EMLA) cream Apply 1 application topically as needed. Apply to [facility] 1 hour prior to treatment. 04/09/16   Volanda Napoleon, MD  loperamide (IMODIUM) 2 MG capsule Take 1 capsule (2 mg total) by mouth as needed for diarrhea or loose stools. Take 2 capsules at the onset of diarrhea, then 1 every 2 hours until 12 hours with no BM. May take 2 every 4 hours at night. If diarrhea recurrs, repeat. 04/09/16   Volanda Napoleon, MD  LORazepam (ATIVAN) 1 MG tablet Take 1 tablet (1 mg total) by mouth every 6 (six) hours as needed for anxiety. Prn nausea/vomiting 04/09/16   Volanda Napoleon, MD  metroNIDAZOLE (FLAGYL) 500 MG tablet Take 1 tablet (500 mg total) by mouth 2 (two) times daily. 05/24/16   Montine Circle, PA-C  ondansetron (ZOFRAN) 8 MG tablet Take 1 tablet (8 mg total) by mouth 2 (two) times daily. Start Day  3 after chemo. 04/09/16   Volanda Napoleon, MD  prochlorperazine (COMPAZINE) 10 MG tablet Take 1 tablet (10 mg total) by mouth every 6 (six) hours as needed for nausea or vomiting. 04/09/16   Volanda Napoleon, MD  promethazine (PHENERGAN) 25 MG tablet Take 1 tablet (25 mg total) by mouth every 6 (six) hours as needed for nausea or vomiting. 05/24/16   Montine Circle, PA-C  traMADol (ULTRAM) 50 MG tablet Take 1 tablet (50 mg total) by mouth every 6 (six) hours as needed. 03/15/16   Volanda Napoleon, MD    Physical Exam:    Constitutional: Middle-aged female who appears in NAD, calm, comfortable Vitals:   05/24/16 2245 05/24/16 2330 05/25/16 0023 05/25/16 0101  BP: 122/88 112/78 112/78 126/84  Pulse: 68 (!) 56 65 67  Resp:   18 16  Temp:   98.5 F (36.9 C) 98.2 F (36.8 C)  TempSrc:    Oral   SpO2: 97% 97% 99% 99%  Weight:    69 kg (152 lb 1.9 oz)  Height:    5\' 5"  (1.651 m)   Eyes: PERRL, lids and conjunctivae normal ENMT: Mucous membranes are dry. Posterior pharynx clear of any exudate or lesions.Normal dentition.  Neck: normal, supple, no masses, no thyromegaly Respiratory: clear to auscultation bilaterally, no wheezing, no crackles. Normal respiratory effort. No accessory muscle use.  Cardiovascular: Regular rate and rhythm, no murmurs / rubs / gallops. No extremity edema. 2+ pedal pulses. No carotid bruits.  Abdomen: Generalized tenderness to palpation of the right quadrant and midline of the abdomen. No masses palpated. No hepatosplenomegaly. Bowel sounds positive.  Musculoskeletal: no clubbing / cyanosis. No joint deformity upper and lower extremities. Good ROM, no contractures. Normal muscle tone.  Skin: no rashes, lesions, ulcers. No induration Neurologic: CN 2-12 grossly intact. Sensation intact, DTR normal. Strength 5/5 in all 4.  Psychiatric: Normal judgment and insight. Alert and oriented x 3. Normal mood.     Labs on Admission: I have personally reviewed following labs and imaging studies  CBC:  Recent Labs Lab 05/21/16 0905 05/24/16 1648  WBC 7.1 5.8  NEUTROABS 5.1 4.1  HGB 13.0 12.8  HCT 37.6 37.1  MCV 86 85.7  PLT 234 [id number]   Basic Metabolic Panel:  Recent Labs Lab 05/21/16 0904 05/24/16 1648  NA 143 138  K 3.8 3.7  CL 105 105  CO2 27 26  GLUCOSE 96 89  BUN 11 15  CREATININE 0.7 0.52  CALCIUM 8.9 8.8*   GFR: Estimated Creatinine Clearance: 86.6 mL/min (by C-G formula based on SCr of 0.52 mg/dL). Liver Function Tests:  Recent Labs Lab 05/21/16 0904 05/24/16 1648  AST 37 51*  ALT 45 72*  ALKPHOS 67 50  BILITOT 0.70 0.7  PROT 6.3* 6.6  ALBUMIN 3.5 3.7    Recent Labs Lab 05/24/16 1648  LIPASE 41   No results for input(s): AMMONIA in the last 168 hours. Coagulation Profile: No results for input(s): INR, PROTIME in the last  168 hours. Cardiac Enzymes: No results for input(s): CKTOTAL, CKMB, CKMBINDEX, TROPONINI in the last 168 hours. BNP (last 3 results) No results for input(s): PROBNP in the last 8760 hours. HbA1C: No results for input(s): HGBA1C in the last 72 hours. CBG: No results for input(s): GLUCAP in the last 168 hours. Lipid Profile: No results for input(s): CHOL, HDL, LDLCALC, TRIG, CHOLHDL, LDLDIRECT in the last 72 hours. Thyroid Function Tests: No results for input(s): TSH, T4TOTAL, FREET4, T3FREE, THYROIDAB in  the last 72 hours. Anemia Panel: No results for input(s): VITAMINB12, FOLATE, FERRITIN, TIBC, IRON, RETICCTPCT in the last 72 hours. Urine analysis:    Component Value Date/Time   COLORURINE YELLOW 05/24/2016 1820   APPEARANCEUR CLEAR 05/24/2016 1820   LABSPEC 1.016 05/24/2016 1820   PHURINE 6.0 05/24/2016 1820   GLUCOSEU NEGATIVE 05/24/2016 1820   HGBUR NEGATIVE 05/24/2016 1820   BILIRUBINUR NEGATIVE 05/24/2016 1820   KETONESUR NEGATIVE 05/24/2016 1820   PROTEINUR NEGATIVE 05/24/2016 1820   NITRITE NEGATIVE 05/24/2016 1820   LEUKOCYTESUR NEGATIVE 05/24/2016 1820   Sepsis Labs: No results found for this or any previous visit (from the past 240 hour(s)).   Radiological Exams on Admission: Ct Abdomen Pelvis W Contrast  Result Date: 05/24/2016 CLINICAL DATA:  46 y/o F; recurrent colon cancer. Abdominal pain. History of appendectomy. EXAM: CT ABDOMEN AND PELVIS WITH CONTRAST TECHNIQUE: Multidetector CT imaging of the abdomen and pelvis was performed using the standard protocol following bolus administration of intravenous contrast. CONTRAST:  183mL ISOVUE-300 IOPAMIDOL (ISOVUE-300) INJECTION 61% COMPARISON:  04/12/2016 PET-CT. FINDINGS: Lower chest: No acute abnormality. Hepatobiliary: Stable subcentimeter lucencies in the left lobe of liver near gallbladder fossa probably representing hepatic cysts. No new focal liver lesion. Normal gallbladder. No intra or extrahepatic biliary ductal  dilatation. Pancreas: Unremarkable. No pancreatic ductal dilatation or surrounding inflammatory changes. Spleen: Stable 3 mm lucency in the upper spleen, probably cysts. No new splenic lesion. Adrenals/Urinary Tract: Normal adrenal glands. Normal kidneys. No hydronephrosis. Normal bladder. Stomach/Bowel: Soft tissue mass at the colorectal anastomosis is markedly decreased in size with minimal residual soft tissue thickening. Stable subcentimeter lymph node and left-sided pericolonic fat (series 2, image 75). Peritoneal implant in right lower quadrant is decreased in size measuring 9 x 22 mm, previously 25 x 32 mm. Peritoneal implant in the left upper quadrant is decreased in size measuring 15 x 7 mm, previously 30 x 20 mm. The left pericolic gutter peritoneal implant is no longer appreciated. There is wall thickening of the colon involving splenic flexure and descending segment with mild surrounding edema and trace fluid in the left pericolic gutter compatible with colitis. Vascular/Lymphatic: No significant vascular findings are present. No enlarged abdominal or pelvic lymph nodes. Reproductive: Uterus and bilateral adnexa are unremarkable. Other: No abdominal wall hernia or abnormality. No abdominopelvic ascites. Musculoskeletal: No acute or significant osseous findings. IMPRESSION: 1. Acute colitis involving splenic flexure and descending segment with mild surrounding edema and trace fluid in the pericolic gutter. No evidence for perforation or abscess. 2. Mass at the colorectal anastomosis and peritoneal implants are decreased in size or no longer appreciable. No new metastatic disease identified. Electronically Signed   By: Kristine Garbe M.D.   On: 05/24/2016 19:17     Assessment/Plan Abdominal pain 2/2 Acute colitis: Patient presents with abdominal pain with nausea and vomiting symptoms. CT scan shows active colitis around the splenic flexure and descending colon. Antibiotics of IV Cipro and  Flagyl as patient unable to tolerate oral medications at this time. - Admit to MedSurg bed - IVF NS at 100 ml/hr - Continue empiric antibiotics of IV Cipro and Flagyl - Convert antibiotics to p.o. when able  - Pain control  Intractable nausea and vomiting  - Zofran IV / Compazine IV prn nausea/vomiting/refractory vomiting - Will need to advance diet as tolerated in a.m.  Anxiety - Will need to place on IV Ativan prn anxiety  Metastatic colon cancer on chemotherapy: Followed by Dr.Ennever - May want to Notify that the  patient is presently in the hospital  DVT prophylaxis: Lovenox   Code Status: Full Family Communication: No family present at bedside Disposition Plan: Likely discharge home in 1-2 days Consults called: none  Admission status: Observation  Norval Morton MD [facility] Pager [phone]  If 7PM-7AM, please contact night-coverage [email or web address] Password [email or web address]  05/25/2016, 1:16 AM

## 2016-05-26 DIAGNOSIS — D709 Neutropenia, unspecified: Secondary | ICD-10-CM | POA: Diagnosis not present

## 2016-05-26 DIAGNOSIS — K529 Noninfective gastroenteritis and colitis, unspecified: Secondary | ICD-10-CM | POA: Diagnosis not present

## 2016-05-26 DIAGNOSIS — C7989 Secondary malignant neoplasm of other specified sites: Secondary | ICD-10-CM | POA: Diagnosis not present

## 2016-05-26 DIAGNOSIS — C189 Malignant neoplasm of colon, unspecified: Secondary | ICD-10-CM | POA: Diagnosis not present

## 2016-05-26 LAB — GASTROINTESTINAL PANEL BY PCR, STOOL (REPLACES STOOL CULTURE)
ADENOVIRUS F40/41: NOT DETECTED
Astrovirus: NOT DETECTED
CRYPTOSPORIDIUM: NOT DETECTED
CYCLOSPORA CAYETANENSIS: NOT DETECTED
Campylobacter species: NOT DETECTED
ENTEROAGGREGATIVE E COLI (EAEC): NOT DETECTED
ENTEROPATHOGENIC E COLI (EPEC): NOT DETECTED
Entamoeba histolytica: NOT DETECTED
Enterotoxigenic E coli (ETEC): NOT DETECTED
GIARDIA LAMBLIA: NOT DETECTED
Norovirus GI/GII: NOT DETECTED
PLESIMONAS SHIGELLOIDES: NOT DETECTED
Rotavirus A: NOT DETECTED
SALMONELLA SPECIES: NOT DETECTED
SHIGELLA/ENTEROINVASIVE E COLI (EIEC): NOT DETECTED
Sapovirus (I, II, IV, and V): NOT DETECTED
Shiga like toxin producing E coli (STEC): NOT DETECTED
VIBRIO SPECIES: NOT DETECTED
Vibrio cholerae: NOT DETECTED
YERSINIA ENTEROCOLITICA: NOT DETECTED

## 2016-05-26 LAB — BASIC METABOLIC PANEL
Anion gap: 5 (ref 5–15)
BUN: 9 mg/dL (ref 6–20)
CHLORIDE: 107 mmol/L (ref 101–111)
CO2: 26 mmol/L (ref 22–32)
CREATININE: 0.54 mg/dL (ref 0.44–1.00)
Calcium: 8.6 mg/dL — ABNORMAL LOW (ref 8.9–10.3)
GFR calc Af Amer: >60 mL/min (ref >60)
GFR calc non Af Amer: >60 mL/min (ref >60)
GLUCOSE: 108 mg/dL — AB (ref 65–99)
POTASSIUM: 3.6 mmol/L (ref 3.5–5.1)
SODIUM: 138 mmol/L (ref 135–145)

## 2016-05-26 LAB — CBC
HCT: 33.8 % — ABNORMAL LOW (ref 36.0–46.0)
HEMOGLOBIN: 11.4 g/dL — AB (ref 12.0–15.0)
MCH: 28.6 pg (ref 26.0–34.0)
MCHC: 33.7 g/dL (ref 30.0–36.0)
MCV: 84.9 fL (ref 78.0–100.0)
Platelets: 185 10*3/uL (ref 150–400)
RBC: 3.98 MIL/uL (ref 3.87–5.11)
RDW: 13 % (ref 11.5–15.5)
WBC: 3.5 10*3/uL — ABNORMAL LOW (ref 4.0–10.5)

## 2016-05-26 MED ORDER — CIPROFLOXACIN HCL 500 MG PO TABS: 500.0000 mg | ORAL_TABLET | Freq: Two times a day (BID) | ORAL | 0 refills | Status: AC

## 2016-05-26 MED ORDER — ONDANSETRON HCL 8 MG PO TABS: 8.0000 mg | ORAL_TABLET | Freq: Three times a day (TID) | ORAL | 0 refills | Status: DC | PRN

## 2016-05-26 MED ORDER — FILGRASTIM 300 MCG/ML IJ SOLN
300.0000 ug | Freq: Once | INTRAMUSCULAR | Status: AC
  Administered 2016-05-26: 300 ug via SUBCUTANEOUS
  Filled 2016-05-26: qty 1

## 2016-05-26 MED ORDER — METRONIDAZOLE 500 MG PO TABS: 500.0000 mg | ORAL_TABLET | Freq: Three times a day (TID) | ORAL | 0 refills | Status: AC

## 2016-05-26 MED ORDER — HEPARIN SOD (PORK) LOCK FLUSH 100 UNIT/ML IV SOLN
500.0000 [IU] | Freq: Once | INTRAVENOUS | Status: AC
  Administered 2016-05-26: 500 [IU] via INTRAVENOUS
  Filled 2016-05-26: qty 5

## 2016-05-26 NOTE — Discharge Summary (Addendum)
Carla Mills, is a 46 y.o. female  DOB 14-Jun-1970  MRN [id number].  Admission date:  05/24/2016  Admitting Physician  Norval Morton, MD  Discharge Date:  05/26/2016   Primary MD  No PCP Per Patient  Recommendations for primary care physician for things to follow:     Admission Diagnosis  Colitis [K52.9]   Discharge Diagnosis  Colitis [K52.9]    Principal Problem:   Acute colitis Active Problems:   Colonic cancer ([place])   Intractable nausea and vomiting   Anxiety state      Past Medical History:  Diagnosis Date  . 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 Colonic cancer ([place]) 03/09/2015  . History of chemotherapy    completed on 09/12/2015   . PONV (postoperative nausea and vomiting)   . Rectal mass 02/20/15   Rectal/sigmoid mass    Past Surgical History:  Procedure Laterality Date  . APPENDECTOMY  02/20/15   Abnormal appearing  . CESAREAN SECTION     x 2  . COLONOSCOPY N/A 10/09/2015   Procedure: COLONOSCOPY;  Surgeon: Leighton Ruff, MD;  Location: [facility];  Service: Endoscopy;  Laterality: N/A;  . COLOSTOMY  02/20/15   Sigmoid/rectal mass  . COLOSTOMY N/A 02/20/2015   Procedure: COLOSTOMY;  Surgeon: [name], MD;  Location: [facility];  Service: General;  Laterality: N/A;  . COLOSTOMY REVERSAL N/A 11/15/2015   Procedure: COLOSTOMY REVERSAL;  Surgeon: [name], MD;  Location: [facility];  Service: General;  Laterality: N/A;  . EXPLORATORY LAPAROTOMY  02/20/15   Sigmoid/rectal mass  . FLEXIBLE SIGMOIDOSCOPY N/A 02/15/2015   Procedure: FLEXIBLE SIGMOIDOSCOPY;  Surgeon: [name], MD;  Location: [facility] ENDOSCOPY;  Service: Endoscopy;  Laterality: N/A;  . IR GENERIC HISTORICAL  04/08/2016   IR FLUORO GUIDE PORT INSERTION RIGHT 04/08/2016 [name], MD [facility]  . IR GENERIC HISTORICAL  04/08/2016   IR US GUIDE VASC ACCESS RIGHT  04/08/2016 [name], MD [facility]  . LAPAROSCOPIC LYSIS OF ADHESIONS N/A 11/15/2015   Procedure: LAPAROSCOPIC LYSIS OF ADHESIONS;  Surgeon: [name], MD;  Location: [facility];  Service: General;  Laterality: N/A;  . LAPAROSCOPY N/A 02/20/2015   Procedure: LAPAROSCOPY DIAGNOSTIC;  Surgeon: [name], MD;  Location: [place];  Service: General;  Laterality: N/A;  . LAPAROSCOPY ABDOMEN DIAGNOSTIC  02/20/15   Converted to open - Sigmoid/rectal mass  . LAPAROTOMY N/A 02/20/2015   Procedure: EXPLORATORY LAPAROTOMY AND PARTIAL COLECTOMY;  Surgeon: [name], MD;  Location: [place];  Service: General;  Laterality: N/A;  . LOW ANTERIOR BOWEL RESECTION  02/20/15   Sigmoid/rectal mass  . OSTEOTOMY     reconstructed coccyx   . reconstructed nerve in ankle     . TONSILLECTOMY         HPI  from the history and physical done on the day of admission:    Chief Complaint: Abdominal pain with nausea and vomiting.  HPI: Carla Mills is a 46 y.o. female with medical history significant  of metastatic colon cancer on chemotherapy followed by Dr. Marin Olp; who presents with complaints of abdominal pain with nausea and vomiting since yesterday morning. Patient reports that she just finished eating breakfast when she developed acute onset of achy abdominal pain that was intermittently sharp. Pain was located in the middle and right side of her abdomen. She called in to her oncologist's office and was advised to take Gas-X. Patient reports taking Gas-X as well as changing positions without any relief of symptoms. As the day proceeded patient felt pain was progressively worsening and rated it as a 5 out of 10 on the pain scale. Associated symptoms included nausea, vomiting despite oral antiemetics, diarrhea( earlier in the week), and nasal congestion. Patient reports that her colon cancer returned in 01/2016 and is being treated currently with chemotherapy. Reports complaining her fourth round of  chemotherapy 2 days ago and her next infusion is scheduled for the 13th of this month. Denies any recent antibiotics, chest pain, shortness of breath, hematemesis, weight loss, or  rash. ED Course: Blood admission into the emergency department patient was seen to have vital signs within normal limits. Lab work was relatively unremarkable. CT abd shows acute colitis around splenic flexure and descending colon. Patient was started on IV ciprofloxacin, IV metronidazole, NS IV fluids at 100 ml/hr, antiemetics, and pain medication. Transfer to a MedSurg bed. Patient states that after oral pain medication that she continued to have vomiting despite antiemetics.  Review of Systems: As per HPI otherwise 10 point review of systems negative.      Hospital Course:   1)Colitis- ??? Etiology (Possible Infectious ), Treated with Iv Cipro and Flagyl , Abdominal pain is better, no fevers, patient is developing some leukopenia most likely due to recent chemotherapy .  Patient had a formed BM , tolerating solid diet well .  CT scan shows active colitis around the splenic flexure and descending colon . Ok to  discharge home on cipro 500 mg bid x 10 days and Flagyl  2)Metastatic colon cancer-CT abdomen and pelvis suggested significant reduction in peritoneal metastatic disease, Followed by Dr.Ennever, his oncologic consult note noted  3)Rhinitis/postnasal drainage- c/n  Flonase and allegra  4)Intractable nausea and vomiting - resolved,  5)Leukopenia- Oncologist Dr. Marin Olp advised Neupogen injection prior to discharge home on 05/26/16  Discharge Condition: improved  Follow UP  Follow-up Information    [facility] Follow up.   Specialty:  Emergency Medicine Why:  If symptoms worsen Contact information: [address] [id number] [place] [phone]       Your Primary Care Doctor Follow up.        Volanda Napoleon, MD. Schedule an  appointment as soon as possible for a visit in 3 day(s).   Specialty:  Oncology Why:  Repeat CBC Contact information: [address] [place] [address] [phone]            Consults obtained - Oncology  Diet and Activity recommendation:  As advised  Discharge Instructions    Discharge Instructions    Call MD for:  difficulty breathing, headache or visual disturbances    Complete by:  As directed    Call MD for:  hives    Complete by:  As directed    Call MD for:  persistant dizziness or light-headedness    Complete by:  As directed    Call MD for:  persistant nausea and vomiting    Complete by:  As directed    Call MD for:  severe uncontrolled pain    Complete by:  As directed    Call MD for:  temperature >100.4    Complete by:  As directed    Diet general    Complete by:  As directed    Discharge instructions    Complete by:  As directed    1)Take medications as prescribed 2)Follow-up with Dr. Craig Staggers for repeat CBC blood test in 3-4 days 3) drink plenty fluids 4) diet as desired and as tolerated   Increase activity slowly    Complete by:  As directed         Discharge Medications     Allergies as of 05/26/2016      Reactions   Bactrim [sulfamethoxazole-trimethoprim]    Oxaprozin Hives   REACTION: Hives   Penicillins Hives   REACTION: Hives Has patient had a PCN reaction causing immediate rash, facial/tongue/throat swelling, SOB or lightheadedness with hypotension: no Has patient had a PCN reaction causing severe rash involving mucus membranes or skin necrosis: {no Has patient had a PCN reaction that required hospitalization no Has patient had a PCN reaction occurring within the last 10 years: no If all of the above answers are "NO", then may proceed with Cephalosporin use.   Sulfonamide Derivatives Swelling   Massive extremity swelling       Medication List    TAKE these medications   acetaminophen 500 MG tablet Commonly  known as:  TYLENOL Take 1,000 mg by mouth every 8 (eight) hours as needed for moderate pain.   ciprofloxacin 500 MG tablet Commonly known as:  CIPRO Take 1 tablet (500 mg total) by mouth 2 (two) times daily.   dexamethasone 4 MG tablet Commonly known as:  DECADRON Take 2 tablets (8 mg total) by mouth daily. Start the day after chemo and take for 2 days.   fexofenadine 180 MG tablet Commonly known as:  ALLEGRA Take 180 mg by mouth daily as needed for allergies.   fluticasone 50 MCG/ACT nasal spray Commonly known as:  FLONASE Place 1 spray into both nostrils 2 (two) times daily.   HYDROcodone-acetaminophen 5-325 MG tablet Commonly known as:  NORCO/VICODIN Take 1-2 tablets by mouth every 6 (six) hours as needed.   ibuprofen 200 MG tablet Commonly known as:  ADVIL,MOTRIN Take 400 mg by mouth every 6 (six) hours as needed for headache or mild pain.   lidocaine-prilocaine cream Commonly known as:  EMLA Apply 1 application topically as needed. Apply to [facility] 1 hour prior to treatment.   loperamide 2 MG capsule Commonly known as:  IMODIUM Take 1 capsule (2 mg total) by mouth as needed for diarrhea or loose stools. Take 2 capsules at the onset of diarrhea, then 1 every 2 hours until 12 hours with no BM. May take 2 every 4 hours at night. If diarrhea recurrs, repeat.   LORazepam 1 MG tablet Commonly known as:  ATIVAN Take 1 tablet (1 mg total) by mouth every 6 (six) hours as needed for anxiety. Prn nausea/vomiting   metroNIDAZOLE 500 MG tablet Commonly known as:  FLAGYL Take 1 tablet (500 mg total) by mouth 3 (three) times daily.   ondansetron 8 MG tablet Commonly known as:  ZOFRAN Take 1 tablet (8 mg total) by mouth every 8 (eight) hours as needed for nausea or vomiting. Start Day 3 after chemo. What changed:  when to take this  reasons to take this   prochlorperazine 10 MG  tablet Commonly known as:  COMPAZINE Take 1 tablet (10 mg total) by mouth every 6 (six) hours as  needed for nausea or vomiting.   promethazine 25 MG tablet Commonly known as:  PHENERGAN Take 1 tablet (25 mg total) by mouth every 6 (six) hours as needed for nausea or vomiting.   traMADol 50 MG tablet Commonly known as:  ULTRAM Take 1 tablet (50 mg total) by mouth every 6 (six) hours as needed. What changed:  reasons to take this       Major procedures and Radiology Reports - PLEASE review detailed and final reports for all details, in brief -    Ct Abdomen Pelvis W Contrast  Result Date: 05/24/2016 CLINICAL DATA:  46 y/o F; recurrent colon cancer. Abdominal pain. History of appendectomy. EXAM: CT ABDOMEN AND PELVIS WITH CONTRAST TECHNIQUE: Multidetector CT imaging of the abdomen and pelvis was performed using the standard protocol following bolus administration of intravenous contrast. CONTRAST:  138mL ISOVUE-300 IOPAMIDOL (ISOVUE-300) INJECTION 61% COMPARISON:  04/12/2016 PET-CT. FINDINGS: Lower chest: No acute abnormality. Hepatobiliary: Stable subcentimeter lucencies in the left lobe of liver near gallbladder fossa probably representing hepatic cysts. No new focal liver lesion. Normal gallbladder. No intra or extrahepatic biliary ductal dilatation. Pancreas: Unremarkable. No pancreatic ductal dilatation or surrounding inflammatory changes. Spleen: Stable 3 mm lucency in the upper spleen, probably cysts. No new splenic lesion. Adrenals/Urinary Tract: Normal adrenal glands. Normal kidneys. No hydronephrosis. Normal bladder. Stomach/Bowel: Soft tissue mass at the colorectal anastomosis is markedly decreased in size with minimal residual soft tissue thickening. Stable subcentimeter lymph node and left-sided pericolonic fat (series 2, image 75). Peritoneal implant in right lower quadrant is decreased in size measuring 9 x 22 mm, previously 25 x 32 mm. Peritoneal implant in the left upper quadrant is decreased in size measuring 15 x 7 mm, previously 30 x 20 mm. The left pericolic gutter peritoneal  implant is no longer appreciated. There is wall thickening of the colon involving splenic flexure and descending segment with mild surrounding edema and trace fluid in the left pericolic gutter compatible with colitis. Vascular/Lymphatic: No significant vascular findings are present. No enlarged abdominal or pelvic lymph nodes. Reproductive: Uterus and bilateral adnexa are unremarkable. Other: No abdominal wall hernia or abnormality. No abdominopelvic ascites. Musculoskeletal: No acute or significant osseous findings. IMPRESSION: 1. Acute colitis involving splenic flexure and descending segment with mild surrounding edema and trace fluid in the pericolic gutter. No evidence for perforation or abscess. 2. Mass at the colorectal anastomosis and peritoneal implants are decreased in size or no longer appreciable. No new metastatic disease identified. Electronically Signed   By: Kristine Garbe M.D.   On: 05/24/2016 19:17    Micro Results   No results found for this or any previous visit (from the past 240 hour(s)).     Today   Subjective    Carla Mills today has no fevers, no chills, no n/v/d, abd pain has resolved, tolerating oral intake well          Patient has been seen and examined prior to discharge   Objective   Blood pressure 105/63, pulse 75, temperature 97.9 F (36.6 C), temperature source Oral, resp. rate 20, height 5\' 5"  (1.651 m), weight 69 kg (152 lb 1.9 oz), SpO2 98 %.   Intake/Output Summary (Last 24 hours) at 05/26/16 1339 Last data filed at 05/26/16 [id number]  Gross per 24 hour  Intake  2055 ml  Output              400 ml  Net             1655 ml    Exam Gen:- Awake  In no apparent distress  HEENT:- [place].AT,   Neck-Supple Neck,No JVD,  Lungs- mostly clear  CV- S1, S2 normal Abd-  +ve B.Sounds, Abd Soft, No tenderness,  Scars noted Extremity/Skin:- Intact peripheral pulses     Data Review   CBC w Diff: Lab Results  Component Value Date   WBC  3.5 (L) 05/26/2016   HGB 11.4 (L) 05/26/2016   HGB 13.0 05/21/2016   HCT 33.8 (L) 05/26/2016   HCT 37.6 05/21/2016   PLT 185 05/26/2016   PLT 234 05/21/2016   LYMPHOPCT 23 05/24/2016   LYMPHOPCT 19.0 05/21/2016   MONOPCT 6 05/24/2016   MONOPCT 7.2 05/21/2016   EOSPCT 1 05/24/2016   EOSPCT 2.1 05/21/2016   BASOPCT 0 05/24/2016   BASOPCT 0.3 05/21/2016    CMP: Lab Results  Component Value Date   NA 138 05/26/2016   NA 143 05/21/2016   NA 141 08/25/2015   K 3.6 05/26/2016   K 3.8 05/21/2016   K 4.7 08/25/2015   CL 107 05/26/2016   CL 105 05/21/2016   CO2 26 05/26/2016   CO2 27 05/21/2016   CO2 23 08/25/2015   BUN 9 05/26/2016   BUN 11 05/21/2016   BUN 16.9 08/25/2015   CREATININE 0.54 05/26/2016   CREATININE 0.7 05/21/2016   CREATININE 0.8 08/25/2015   PROT 6.6 05/24/2016   PROT 6.3 (L) 05/21/2016   PROT 7.0 08/25/2015   ALBUMIN 3.7 05/24/2016   ALBUMIN 3.5 05/21/2016   ALBUMIN 4.1 08/25/2015   BILITOT 0.7 05/24/2016   BILITOT 0.70 05/21/2016   BILITOT 0.94 08/25/2015   ALKPHOS 50 05/24/2016   ALKPHOS 67 05/21/2016   ALKPHOS 52 08/25/2015   AST 51 (H) 05/24/2016   AST 37 05/21/2016   AST 20 08/25/2015   ALT 72 (H) 05/24/2016   ALT 45 05/21/2016   ALT 21 08/25/2015  .   Total Discharge time is about 33 minutes  Carla Mills M.D on 05/26/2016 at 1:39 PM  [facility]   Office  [phone]  Dragon dictation system was used to create this note, attempts have been made to correct errors, however presence of uncorrected errors is not a reflection quality of care provided

## 2016-05-26 NOTE — Progress Notes (Signed)
Patient discharged to home with family, discharge instructions reviewed with patient who verbalized understanding. New RX's given to patient, no RX's for Norco or Phenergan was printed for patient and patient was OK with that.

## 2016-05-26 NOTE — Progress Notes (Signed)
Pharmacy Antibiotic Note  Carla Mills is a 46 y.o. female admitted on 05/24/2016 with Intra-abdominal infection.  Pharmacy has been consulted for Ciprofloxacin dosing.  Plan: Ciprofloxacin 400mg  iv q12hr -  No further dosing adjustments required, Pharmacy will sign off Flagyl per MD  Height: 5\' 5"  (165.1 cm) Weight: 152 lb 1.9 oz (69 kg) IBW/kg (Calculated) : 57  Temp (24hrs), Avg:98 F (36.7 C), Min:97.9 F (36.6 C), Max:98.2 F (36.8 C)   Recent Labs Lab 05/21/16 0904 05/21/16 0905 05/24/16 1648 05/25/16 0425 05/26/16 0852  WBC  --  7.1 5.8 4.9 3.5*  CREATININE 0.7  --  0.52 0.62 0.54    Estimated Creatinine Clearance: 86.6 mL/min (by C-G formula based on SCr of 0.54 mg/dL).    Allergies  Allergen Reactions  . Bactrim [Sulfamethoxazole-Trimethoprim]   . Oxaprozin Hives    REACTION: Hives  . Penicillins Hives    REACTION: Hives Has patient had a PCN reaction causing immediate rash, facial/tongue/throat swelling, SOB or lightheadedness with hypotension: no Has patient had a PCN reaction causing severe rash involving mucus membranes or skin necrosis: {no Has patient had a PCN reaction that required hospitalization no Has patient had a PCN reaction occurring within the last 10 years: no If all of the above answers are "NO", then may proceed with Cephalosporin use.  . Sulfonamide Derivatives Swelling    Massive extremity swelling     Antimicrobials this admission: Ciprofloxacin 3/2 >> Flagyl 3/2 >>   Dose adjustments this admission: -  Microbiology results: C.diff: no specimen GI panel: sent HIV Ab: non reactive  Thank you for allowing pharmacy to be a part of this patient's care.  Peggyann Juba, PharmD, BCPS Pager: [phone] 05/26/2016 9:45 AM

## 2016-05-26 NOTE — Progress Notes (Signed)
[name] continues to improve. She is having some full meals. I think these are mechanically soft. She's not having abdominal pain.  She has the colitis. I'm still not sure as to what caused this. I still would think chemotherapy would be unusual but I cannot rule that out.  She's had no fever. She's had 2 bowel movements.  Abdominal pain is minimal.  Her white cell count is going down. I would like to give her 1 dose of Neupogen to try to bring this back up. I just do not want to see her neutropenic and then have colitis.  Her white cell count 3.5. Hemoglobin 11.4. Her potassium 3.6.  On exam, she is afebrile. Her blood pressures 118/82. Her abdomen is soft. I really cannot elicit much tenderness in the abdomen. Bowel sounds are present. There is no distention. There is no abdominal mass. Extremities shows no clubbing, cyanosis or edema.  Hopefully, she will be able to go home today. At a she's now is on oral Flagyl and Cipro.  Again I would give her a dose of Neupogen before she is discharged.  We will go and see her in the office on Tuesday. She and her husband are still planning to go to [place] on Wednesday watch their daughter in a golf tournament. I think that would be okay.  I very much appreciate all the incredible help that she has been given by her body on 3 W.  Lattie Haw, MD  Acts 3:16

## 2016-05-27 ENCOUNTER — Encounter (HOSPITAL_COMMUNITY): Payer: BLUE CROSS/BLUE SHIELD

## 2016-05-27 ENCOUNTER — Other Ambulatory Visit: Payer: Self-pay | Admitting: Hematology & Oncology

## 2016-05-27 DIAGNOSIS — C187 Malignant neoplasm of sigmoid colon: Secondary | ICD-10-CM

## 2016-05-27 DIAGNOSIS — C772 Secondary and unspecified malignant neoplasm of intra-abdominal lymph nodes: Principal | ICD-10-CM

## 2016-05-28 ENCOUNTER — Ambulatory Visit (HOSPITAL_BASED_OUTPATIENT_CLINIC_OR_DEPARTMENT_OTHER): Payer: BLUE CROSS/BLUE SHIELD | Admitting: Family

## 2016-05-28 ENCOUNTER — Other Ambulatory Visit: Payer: Self-pay | Admitting: Family

## 2016-05-28 ENCOUNTER — Ambulatory Visit: Payer: BLUE CROSS/BLUE SHIELD

## 2016-05-28 ENCOUNTER — Other Ambulatory Visit (HOSPITAL_BASED_OUTPATIENT_CLINIC_OR_DEPARTMENT_OTHER): Payer: BLUE CROSS/BLUE SHIELD

## 2016-05-28 DIAGNOSIS — C184 Malignant neoplasm of transverse colon: Secondary | ICD-10-CM

## 2016-05-28 DIAGNOSIS — C187 Malignant neoplasm of sigmoid colon: Secondary | ICD-10-CM

## 2016-05-28 DIAGNOSIS — C772 Secondary and unspecified malignant neoplasm of intra-abdominal lymph nodes: Principal | ICD-10-CM

## 2016-05-28 LAB — CBC WITH DIFFERENTIAL (CANCER CENTER ONLY)
BASO#: 0 10*3/uL (ref 0.0–0.2)
BASO%: 0.1 % (ref 0.0–2.0)
EOS%: 3.1 % (ref 0.0–7.0)
Eosinophils Absolute: 0.3 10*3/uL (ref 0.0–0.5)
HCT: 35.6 % (ref 34.8–46.6)
HEMOGLOBIN: 12.3 g/dL (ref 11.6–15.9)
LYMPH#: 1.2 10*3/uL (ref 0.9–3.3)
LYMPH%: 12.1 % — AB (ref 14.0–48.0)
MCH: 29.3 pg (ref 26.0–34.0)
MCHC: 34.6 g/dL (ref 32.0–36.0)
MCV: 85 fL (ref 81–101)
MONO#: 0.6 10*3/uL (ref 0.1–0.9)
MONO%: 5.7 % (ref 0.0–13.0)
NEUT%: 79 % (ref 39.6–80.0)
NEUTROS ABS: 7.7 10*3/uL — AB (ref 1.5–6.5)
PLATELETS: 210 10*3/uL (ref 145–400)
RBC: 4.2 10*6/uL (ref 3.70–5.32)
RDW: 12.6 % (ref 11.1–15.7)
WBC: 9.8 10*3/uL (ref 3.9–10.0)

## 2016-05-28 LAB — CEA (IN HOUSE-CHCC): CEA (CHCC-IN HOUSE): 1.72 ng/mL (ref 0.00–5.00)

## 2016-05-28 LAB — COMPREHENSIVE METABOLIC PANEL
ALT: 34 U/L (ref 0–55)
AST: 19 U/L (ref 5–34)
Albumin: 3.8 g/dL (ref 3.5–5.0)
Alkaline Phosphatase: 72 U/L (ref 40–150)
Anion Gap: 9 mEq/L (ref 3–11)
BILIRUBIN TOTAL: 0.22 mg/dL (ref 0.20–1.20)
BUN: 12.2 mg/dL (ref 7.0–26.0)
CHLORIDE: 108 meq/L (ref 98–109)
CO2: 23 meq/L (ref 22–29)
Calcium: 8.9 mg/dL (ref 8.4–10.4)
Creatinine: 0.7 mg/dL (ref 0.6–1.1)
GLUCOSE: 92 mg/dL (ref 70–140)
Potassium: 3.8 mEq/L (ref 3.5–5.1)
SODIUM: 140 meq/L (ref 136–145)
TOTAL PROTEIN: 6.7 g/dL (ref 6.4–8.3)

## 2016-05-28 NOTE — Progress Notes (Signed)
Hematology and Oncology Follow Up Visit  Carla Mills [id number] [birth year] 46 y.o. 05/28/2016   Principle Diagnosis:  Metastatic colon cancer HER2 (-)Zachery Dakins stable/MMR normal/BRAF -  Current Therapy:   FOLFOXIRI every 2 weeks s/p cycle 4    Interim History:  [name] is here today with her mother for follow-up. She was hospitalized over the weekend with colitis. She is now home on Flagyl and Cipro and feeling much better.  She is eating well and able to stay hydrated. Her abdominal pain has resolved. No constipation or diarrhea. Her weight has remained stable.  She received Neupogen over the weekend and her WBC count is up to   She states that she did not get much rest in the hospital and was not able to sleep well last night. She is going to try taking an Ativan tonight and see if this helps.  No fever, chills, n/v, cough, rash, dizziness, SOB, chest pain, palpitations or changes in bladder habits.  She has had no episodes of bleeding. No bruising or petechiae. No lymphadenopathy palpated on exam.  No swelling, tenderness, numbness or tingling in her extremities. No c/o pain at this time.  She and her husband will be flying to [place] tomorrow to see their daughter play in a golf tournament.   Medications:  Allergies as of 05/28/2016      Reactions   Bactrim [sulfamethoxazole-trimethoprim]    Oxaprozin Hives   REACTION: Hives   Penicillins Hives   REACTION: Hives Has patient had a PCN reaction causing immediate rash, facial/tongue/throat swelling, SOB or lightheadedness with hypotension: no Has patient had a PCN reaction causing severe rash involving mucus membranes or skin necrosis: {no Has patient had a PCN reaction that required hospitalization no Has patient had a PCN reaction occurring within the last 10 years: no If all of the above answers are "NO", then may proceed with Cephalosporin use.   Sulfonamide Derivatives Swelling   Massive extremity swelling         Medication List       Accurate as of 05/28/16  9:39 AM. Always use your most recent med list.          acetaminophen 500 MG tablet Commonly known as:  TYLENOL Take 1,000 mg by mouth every 8 (eight) hours as needed for moderate pain.   ciprofloxacin 500 MG tablet Commonly known as:  CIPRO Take 1 tablet (500 mg total) by mouth 2 (two) times daily.   dexamethasone 4 MG tablet Commonly known as:  DECADRON Take 2 tablets (8 mg total) by mouth daily. Start the day after chemo and take for 2 days.   fexofenadine 180 MG tablet Commonly known as:  ALLEGRA Take 180 mg by mouth daily as needed for allergies.   fluticasone 50 MCG/ACT nasal spray Commonly known as:  FLONASE Place 1 spray into both nostrils 2 (two) times daily.   HYDROcodone-acetaminophen 5-325 MG tablet Commonly known as:  NORCO/VICODIN Take 1-2 tablets by mouth every 6 (six) hours as needed.   ibuprofen 200 MG tablet Commonly known as:  ADVIL,MOTRIN Take 400 mg by mouth every 6 (six) hours as needed for headache or mild pain.   lidocaine-prilocaine cream Commonly known as:  EMLA Apply 1 application topically as needed. Apply to [facility] 1 hour prior to treatment.   loperamide 2 MG capsule Commonly known as:  IMODIUM Take 1 capsule (2 mg total) by mouth as needed for diarrhea or loose stools. Take 2 capsules at the onset of diarrhea, then  1 every 2 hours until 12 hours with no BM. May take 2 every 4 hours at night. If diarrhea recurrs, repeat.   LORazepam 1 MG tablet Commonly known as:  ATIVAN Take 1 tablet (1 mg total) by mouth every 6 (six) hours as needed for anxiety. Prn nausea/vomiting   metroNIDAZOLE 500 MG tablet Commonly known as:  FLAGYL Take 1 tablet (500 mg total) by mouth 3 (three) times daily.   ondansetron 8 MG tablet Commonly known as:  ZOFRAN Take 1 tablet (8 mg total) by mouth every 8 (eight) hours as needed for nausea or vomiting. Start Day 3 after chemo.   prochlorperazine 10 MG  tablet Commonly known as:  COMPAZINE Take 1 tablet (10 mg total) by mouth every 6 (six) hours as needed for nausea or vomiting.   promethazine 25 MG tablet Commonly known as:  PHENERGAN Take 1 tablet (25 mg total) by mouth every 6 (six) hours as needed for nausea or vomiting.   traMADol 50 MG tablet Commonly known as:  ULTRAM Take 1 tablet (50 mg total) by mouth every 6 (six) hours as needed.       Allergies:  Allergies  Allergen Reactions  . Bactrim [Sulfamethoxazole-Trimethoprim]   . Oxaprozin Hives    REACTION: Hives  . Penicillins Hives    REACTION: Hives Has patient had a PCN reaction causing immediate rash, facial/tongue/throat swelling, SOB or lightheadedness with hypotension: no Has patient had a PCN reaction causing severe rash involving mucus membranes or skin necrosis: {no Has patient had a PCN reaction that required hospitalization no Has patient had a PCN reaction occurring within the last 10 years: no If all of the above answers are "NO", then may proceed with Cephalosporin use.  . Sulfonamide Derivatives Swelling    Massive extremity swelling     Past Medical History, Surgical history, Social history, and Family History were reviewed and updated.  Review of Systems: All other 10 point review of systems is negative.   Physical Exam:  vitals were not taken for this visit.  Wt Readings from Last 3 Encounters:  05/25/16 152 lb 1.9 oz (69 kg)  05/07/16 152 lb 0.6 oz (69 kg)  04/08/16 148 lb (67.1 kg)    Ocular: Sclerae unicteric, pupils equal, round and reactive to light Ear-nose-throat: Oropharynx clear, dentition fair Lymphatic: No cervical supraclavicular or axillary adenopathy Lungs no rales or rhonchi, good excursion bilaterally Heart regular rate and rhythm, no murmur appreciated Abd soft, nontender, positive bowel sounds, no liver or spleen tip palpated on exam, no fluid wave MSK no focal spinal tenderness, no joint edema Neuro: non-focal,  well-oriented, appropriate affect Breasts: Deferred  Lab Results  Component Value Date   WBC 9.8 05/28/2016   HGB 12.3 05/28/2016   HCT 35.6 05/28/2016   MCV 85 05/28/2016   PLT 210 05/28/2016   Lab Results  Component Value Date   FERRITIN 88 06/22/2015   IRON 98 06/22/2015   TIBC 374 06/22/2015   UIBC 275 06/22/2015   IRONPCTSAT 26 06/22/2015   Lab Results  Component Value Date   RETICCTPCT 0.8 02/12/2015   RBC 4.20 05/28/2016   No results found for: KPAFRELGTCHN, LAMBDASER, KAPLAMBRATIO No results found for: IGGSERUM, IGA, IGMSERUM No results found for: Odetta Pink, SPEI   Chemistry      Component Value Date/Time   NA 138 05/26/2016 0852   NA 143 05/21/2016 0904   NA 141 08/25/2015 0820   K 3.6 05/26/2016  0852   K 3.8 05/21/2016 0904   K 4.7 08/25/2015 0820   CL 107 05/26/2016 0852   CL 105 05/21/2016 0904   CO2 26 05/26/2016 0852   CO2 27 05/21/2016 0904   CO2 23 08/25/2015 0820   BUN 9 05/26/2016 0852   BUN 11 05/21/2016 0904   BUN 16.9 08/25/2015 0820   CREATININE 0.54 05/26/2016 0852   CREATININE 0.7 05/21/2016 0904   CREATININE 0.8 08/25/2015 0820      Component Value Date/Time   CALCIUM 8.6 (L) 05/26/2016 0852   CALCIUM 8.9 05/21/2016 0904   CALCIUM 9.3 08/25/2015 0820   ALKPHOS 50 05/24/2016 1648   ALKPHOS 67 05/21/2016 0904   ALKPHOS 52 08/25/2015 0820   AST 51 (H) 05/24/2016 1648   AST 37 05/21/2016 0904   AST 20 08/25/2015 0820   ALT 72 (H) 05/24/2016 1648   ALT 45 05/21/2016 0904   ALT 21 08/25/2015 0820   BILITOT 0.7 05/24/2016 1648   BILITOT 0.70 05/21/2016 0904   BILITOT 0.94 08/25/2015 0820     Impression and Plan: [name] is a 46 yo white female with metastatic colon cancer. She has done well with FOLFOXIRI and has completed 4 cycles so far. She did develop colitis last weekend and spent several days in the hospital. She is now home on Flagyl and Cipro. Her symptoms have  resolved and she is feeling much better.  She denies needing fluids today. She is eating a drinking well. No abdominal discomfort.  We will reschedule her PET scan for around 3/19 and resume treatment that same week after she has finished her antibiotics. She will get a new schedule today reflecting these changes.  Both she and her family know to contact our office with any questions or concerns. We can certainly see her sooner if need be.   Eliezer Bottom, NP 3/6/20189:39 AM

## 2016-06-04 ENCOUNTER — Other Ambulatory Visit: Payer: BLUE CROSS/BLUE SHIELD

## 2016-06-04 ENCOUNTER — Ambulatory Visit: Payer: BLUE CROSS/BLUE SHIELD

## 2016-06-04 ENCOUNTER — Ambulatory Visit: Payer: BLUE CROSS/BLUE SHIELD | Admitting: Hematology & Oncology

## 2016-06-07 DIAGNOSIS — C189 Malignant neoplasm of colon, unspecified: Secondary | ICD-10-CM | POA: Diagnosis not present

## 2016-06-10 ENCOUNTER — Encounter (HOSPITAL_COMMUNITY)
Admission: RE | Admit: 2016-06-10 | Discharge: 2016-06-10 | Disposition: A | Discharge status: Home / Self Care | Payer: BLUE CROSS/BLUE SHIELD | Source: Ambulatory Visit | Attending: Hematology & Oncology | Admitting: Hematology & Oncology

## 2016-06-10 DIAGNOSIS — C772 Secondary and unspecified malignant neoplasm of intra-abdominal lymph nodes: Secondary | ICD-10-CM | POA: Diagnosis not present

## 2016-06-10 DIAGNOSIS — C187 Malignant neoplasm of sigmoid colon: Secondary | ICD-10-CM | POA: Diagnosis not present

## 2016-06-10 LAB — GLUCOSE, CAPILLARY: GLUCOSE-CAPILLARY: 85 mg/dL (ref 65–99)

## 2016-06-10 MED ORDER — FLUDEOXYGLUCOSE F - 18 (FDG) INJECTION
7.5400 | Freq: Once | INTRAVENOUS | Status: AC | PRN
  Administered 2016-06-10: 7.54 via INTRAVENOUS

## 2016-06-11 ENCOUNTER — Encounter: Payer: Self-pay | Admitting: Hematology & Oncology

## 2016-06-11 ENCOUNTER — Ambulatory Visit: Payer: BLUE CROSS/BLUE SHIELD

## 2016-06-11 ENCOUNTER — Other Ambulatory Visit (HOSPITAL_BASED_OUTPATIENT_CLINIC_OR_DEPARTMENT_OTHER): Payer: BLUE CROSS/BLUE SHIELD

## 2016-06-11 ENCOUNTER — Ambulatory Visit (HOSPITAL_BASED_OUTPATIENT_CLINIC_OR_DEPARTMENT_OTHER): Payer: BLUE CROSS/BLUE SHIELD

## 2016-06-11 ENCOUNTER — Ambulatory Visit (HOSPITAL_BASED_OUTPATIENT_CLINIC_OR_DEPARTMENT_OTHER): Payer: BLUE CROSS/BLUE SHIELD | Admitting: Hematology & Oncology

## 2016-06-11 VITALS — BP 106/59 | HR 92 | Temp 97.7°F | Resp 17 | Wt 154.1 lb

## 2016-06-11 DIAGNOSIS — C772 Secondary and unspecified malignant neoplasm of intra-abdominal lymph nodes: Principal | ICD-10-CM

## 2016-06-11 DIAGNOSIS — C189 Malignant neoplasm of colon, unspecified: Secondary | ICD-10-CM

## 2016-06-11 DIAGNOSIS — C187 Malignant neoplasm of sigmoid colon: Secondary | ICD-10-CM

## 2016-06-11 DIAGNOSIS — Z5111 Encounter for antineoplastic chemotherapy: Secondary | ICD-10-CM

## 2016-06-11 DIAGNOSIS — C184 Malignant neoplasm of transverse colon: Secondary | ICD-10-CM

## 2016-06-11 LAB — CBC WITH DIFFERENTIAL (CANCER CENTER ONLY)
BASO#: 0 10*3/uL (ref 0.0–0.2)
BASO%: 0.7 % (ref 0.0–2.0)
EOS ABS: 0.1 10*3/uL (ref 0.0–0.5)
EOS%: 1.9 % (ref 0.0–7.0)
HEMATOCRIT: 37.8 % (ref 34.8–46.6)
HGB: 13 g/dL (ref 11.6–15.9)
LYMPH#: 1.9 10*3/uL (ref 0.9–3.3)
LYMPH%: 32.4 % (ref 14.0–48.0)
MCH: 29.7 pg (ref 26.0–34.0)
MCHC: 34.4 g/dL (ref 32.0–36.0)
MCV: 87 fL (ref 81–101)
MONO#: 0.5 10*3/uL (ref 0.1–0.9)
MONO%: 9.4 % (ref 0.0–13.0)
NEUT#: 3.2 10*3/uL (ref 1.5–6.5)
NEUT%: 55.6 % (ref 39.6–80.0)
PLATELETS: 259 10*3/uL (ref 145–400)
RBC: 4.37 10*6/uL (ref 3.70–5.32)
RDW: 13.8 % (ref 11.1–15.7)
WBC: 5.7 10*3/uL (ref 3.9–10.0)

## 2016-06-11 LAB — CMP (CANCER CENTER ONLY)
ALK PHOS: 59 U/L (ref 26–84)
ALT: 23 U/L (ref 10–47)
AST: 25 U/L (ref 11–38)
Albumin: 3.6 g/dL (ref 3.3–5.5)
BILIRUBIN TOTAL: 0.6 mg/dL (ref 0.20–1.60)
BUN: 18 mg/dL (ref 7–22)
CALCIUM: 9 mg/dL (ref 8.0–10.3)
CHLORIDE: 106 meq/L (ref 98–108)
CO2: 25 mEq/L (ref 18–33)
Creat: 0.7 mg/dl (ref 0.6–1.2)
GLUCOSE: 79 mg/dL (ref 73–118)
POTASSIUM: 4.1 meq/L (ref 3.3–4.7)
Sodium: 142 mEq/L (ref 128–145)
TOTAL PROTEIN: 6.5 g/dL (ref 6.4–8.1)

## 2016-06-11 LAB — LACTATE DEHYDROGENASE: LDH: 185 U/L (ref 125–245)

## 2016-06-11 MED ORDER — OXALIPLATIN CHEMO INJECTION 100 MG/20ML
68.0000 mg/m2 | Freq: Once | INTRAVENOUS | Status: AC
  Administered 2016-06-11: 120 mg via INTRAVENOUS
  Filled 2016-06-11: qty 4

## 2016-06-11 MED ORDER — DEXAMETHASONE SODIUM PHOSPHATE 10 MG/ML IJ SOLN
10.0000 mg | Freq: Once | INTRAMUSCULAR | Status: AC
Start: 2016-06-11 — End: 2016-06-11
  Administered 2016-06-11: 10 mg via INTRAVENOUS

## 2016-06-11 MED ORDER — PALONOSETRON HCL INJECTION 0.25 MG/5ML
INTRAVENOUS | Status: AC
  Filled 2016-06-11: qty 5

## 2016-06-11 MED ORDER — HEPARIN SOD (PORK) LOCK FLUSH 100 UNIT/ML IV SOLN
500.0000 [IU] | Freq: Once | INTRAVENOUS | Status: DC | PRN
  Filled 2016-06-11: qty 5

## 2016-06-11 MED ORDER — DEXTROSE 5 % IV SOLN
Freq: Once | INTRAVENOUS | Status: AC
  Administered 2016-06-11: 09:00:00 via INTRAVENOUS

## 2016-06-11 MED ORDER — IRINOTECAN HCL CHEMO INJECTION 100 MG/5ML
132.0000 mg/m2 | Freq: Once | INTRAVENOUS | Status: AC
  Administered 2016-06-11: 240 mg via INTRAVENOUS
  Filled 2016-06-11: qty 10

## 2016-06-11 MED ORDER — SODIUM CHLORIDE 0.9% FLUSH
10.0000 mL | INTRAVENOUS | Status: DC | PRN
  Filled 2016-06-11: qty 10

## 2016-06-11 MED ORDER — ATROPINE SULFATE 1 MG/ML IJ SOLN: 0.5000 mg | Freq: Once | INTRAMUSCULAR | Status: DC | PRN

## 2016-06-11 MED ORDER — LEUCOVORIN CALCIUM INJECTION 350 MG
197.0000 mg/m2 | Freq: Once | INTRAMUSCULAR | Status: AC
  Administered 2016-06-11: 350 mg via INTRAVENOUS
  Filled 2016-06-11: qty 17.5

## 2016-06-11 MED ORDER — DEXAMETHASONE SODIUM PHOSPHATE 10 MG/ML IJ SOLN
INTRAMUSCULAR | Status: AC
  Filled 2016-06-11: qty 1

## 2016-06-11 MED ORDER — PALONOSETRON HCL INJECTION 0.25 MG/5ML
0.2500 mg | Freq: Once | INTRAVENOUS | Status: AC
Start: 2016-06-11 — End: 2016-06-11
  Administered 2016-06-11: 0.25 mg via INTRAVENOUS

## 2016-06-11 MED ORDER — ATROPINE SULFATE 1 MG/ML IJ SOLN
INTRAMUSCULAR | Status: AC
  Filled 2016-06-11: qty 1

## 2016-06-11 MED ORDER — SODIUM CHLORIDE 0.9 % IV SOLN
2560.0000 mg/m2 | INTRAVENOUS | Status: DC
  Administered 2016-06-11: 4550 mg via INTRAVENOUS
  Filled 2016-06-11: qty 91

## 2016-06-11 NOTE — Progress Notes (Signed)
Hematology and Oncology Follow Up Visit  Carla Mills [id number] May 27, 1970 46 y.o. 06/11/2016   Principle Diagnosis:   Recurrent colon cancer - HER2 (-)/MSI stable/MMR normal/BRAF -  Current Therapy:     FOLFOXIRI on 04/09/2016 - s/p cycle #4     Interim History:  Carla Mills is back for follow-up. She has done very well. She really has had very few side effects of therapy.  She was hospitalized about 3 weeks ago. This was a very brief hospitalization. Not sure exactly what happened. She had a localized colitis. She was treated with Flagyl and Cipro. She resolved this very quickly.   She just got back from [place]. She her husband were down there for her daughter's golf tournament. It was quite cold. They were somewhat disappointed that the weather was not better for them.   Thankfully, her PET scan shows pretty much near complete resolution of her malignancy. I am just absolutely amazed by this. She's had no abdominal pain. She's had no problems with bowels or bladder. She's had no fever. She's had no bleeding. She's had no cough. She's had no rashes. She's had no mouth sores.  Her last CEA was 1.7.  Overall, her performance status is ECOG 0.  Medications:  Current Outpatient Prescriptions:  .  acetaminophen (TYLENOL) 500 MG tablet, Take 1,000 mg by mouth every 8 (eight) hours as needed for moderate pain. , Disp: , Rfl:  .  dexamethasone (DECADRON) 4 MG tablet, Take 2 tablets (8 mg total) by mouth daily. Start the day after chemo and take for 2 days., Disp: 24 tablet, Rfl: 0 .  fexofenadine (ALLEGRA) 180 MG tablet, Take 180 mg by mouth daily as needed for allergies. , Disp: , Rfl:  .  fluticasone (FLONASE) 50 MCG/ACT nasal spray, Place 1 spray into both nostrils 2 (two) times daily. , Disp: , Rfl:  .  HYDROcodone-acetaminophen (NORCO/VICODIN) 5-325 MG tablet, Take 1-2 tablets by mouth every 6 (six) hours as needed., Disp: 15 tablet, Rfl: 0 .  ibuprofen (ADVIL,MOTRIN) 200 MG  tablet, Take 400 mg by mouth every 6 (six) hours as needed for headache or mild pain., Disp: , Rfl:  .  lidocaine-prilocaine (EMLA) cream, Apply 1 application topically as needed. Apply to [facility] 1 hour prior to treatment., Disp: 30 g, Rfl: 3 .  loperamide (IMODIUM) 2 MG capsule, Take 1 capsule (2 mg total) by mouth as needed for diarrhea or loose stools. Take 2 capsules at the onset of diarrhea, then 1 every 2 hours until 12 hours with no BM. May take 2 every 4 hours at night. If diarrhea recurrs, repeat., Disp: 100 capsule, Rfl: 0 .  LORazepam (ATIVAN) 1 MG tablet, Take 1 tablet (1 mg total) by mouth every 6 (six) hours as needed for anxiety. Prn nausea/vomiting, Disp: 30 tablet, Rfl: 0 .  ondansetron (ZOFRAN) 8 MG tablet, Take 1 tablet (8 mg total) by mouth every 8 (eight) hours as needed for nausea or vomiting. Start Day 3 after chemo., Disp: 20 tablet, Rfl: 0 .  prochlorperazine (COMPAZINE) 10 MG tablet, Take 1 tablet (10 mg total) by mouth every 6 (six) hours as needed for nausea or vomiting., Disp: 30 tablet, Rfl: 1 .  promethazine (PHENERGAN) 25 MG tablet, Take 1 tablet (25 mg total) by mouth every 6 (six) hours as needed for nausea or vomiting., Disp: 12 tablet, Rfl: 0 .  traMADol (ULTRAM) 50 MG tablet, Take 1 tablet (50 mg total) by mouth every 6 (six) hours as needed. (  Patient taking differently: Take 50 mg by mouth every 6 (six) hours as needed for moderate pain. ), Disp: 90 tablet, Rfl: 0 No current facility-administered medications for this visit.   Facility-Administered Medications Ordered in Other Visits:  .  clindamycin (CLEOCIN) 900 mg in dextrose 5 % 50 mL IVPB, 900 mg, Intravenous, 60 min Pre-Op **AND** gentamicin (GARAMYCIN) 340 mg in dextrose 5 % 50 mL IVPB, 5 mg/kg, Intravenous, 60 min Pre-Op, [name], MD .  dextrose 5 % solution, , Intravenous, Continuous, Volanda Napoleon, MD, Stopped at 04/09/16 1418 .  heparin lock flush 100 unit/mL, 500 Units, Intracatheter, Once PRN,  Volanda Napoleon, MD .  sodium chloride flush (NS) 0.9 % injection 10 mL, 10 mL, Intracatheter, PRN, Volanda Napoleon, MD  Allergies:  Allergies  Allergen Reactions  . Bactrim [Sulfamethoxazole-Trimethoprim]   . Oxaprozin Hives    REACTION: Hives  . Penicillins Hives    REACTION: Hives Has patient had a PCN reaction causing immediate rash, facial/tongue/throat swelling, SOB or lightheadedness with hypotension: no Has patient had a PCN reaction causing severe rash involving mucus membranes or skin necrosis: {no Has patient had a PCN reaction that required hospitalization no Has patient had a PCN reaction occurring within the last 10 years: no If all of the above answers are "NO", then may proceed with Cephalosporin use.  . Sulfonamide Derivatives Swelling    Massive extremity swelling     Past Medical History, Surgical history, Social history, and Family History were reviewed and updated.  Review of Systems:  As above  Physical Exam:  weight is 154 lb 1.9 oz (69.9 kg). Her oral temperature is 97.7 F (36.5 C). Her blood pressure is 106/59 (abnormal) and her pulse is 92. Her respiration is 17 and oxygen saturation is 100%.   Wt Readings from Last 3 Encounters:  06/11/16 154 lb 1.9 oz (69.9 kg)  05/25/16 152 lb 1.9 oz (69 kg)  05/07/16 152 lb 0.6 oz (69 kg)      Well-developed and well-nourished white female in no obvious distress. Head and neck exam shows no ocular or oral lesions. She has no palpable cervical or supraclavicular lymph nodes. Lungs are clear. Cardiac exam regular rate and rhythm with no murmurs, rubs or bruits. Abdomen is soft. She has well-healed laparotomy scar. She has no fluid wave. There is no palpable liver or spleen tip. There is no obvious abdominal mass. She has no palpable inguinal lymph nodes. Back exam shows no tenderness over the spine, ribs or hips. Extremities shows no clubbing, cyanosis or edema. She's good range motion of her joints. Skin exam shows no  rashes, ecchymoses or petechia. Neurological exam shows no focal neurological deficits.  Lab Results  Component Value Date   WBC 5.7 06/11/2016   HGB 13.0 06/11/2016   HCT 37.8 06/11/2016   MCV 87 06/11/2016   PLT 259 06/11/2016     Chemistry      Component Value Date/Time   NA 140 05/28/2016 0813   K 3.8 05/28/2016 0813   CL 107 05/26/2016 0852   CL 105 05/21/2016 0904   CO2 23 05/28/2016 0813   BUN 12.2 05/28/2016 0813   CREATININE 0.7 05/28/2016 0813      Component Value Date/Time   CALCIUM 8.9 05/28/2016 0813   ALKPHOS 72 05/28/2016 0813   AST 19 05/28/2016 0813   ALT 34 05/28/2016 0813   BILITOT 0.22 05/28/2016 0813         Impression and Plan: Ms.  Crossett is a 46 year old white female. She initially presented with stage II colon cancer back in I think December 2016. She had a very high Oncotype score of 37. Because of this, we went ahead and gave her adjuvant chemotherapy with Xeloda. Despite this, her cancer has recurred.  I did speak with her surgical oncologist at [facility]. Dr. Crisoforo Oxford wants to see her. Hope to, he will consider her for the  HIPEC protocol. I think she would be a very good candidate for this. She clearly has chemosensitive disease. Hopefully he will call her today. Hopefully, he will set her up within a month to have the procedure done.   We will proceed with her 5th cycle of treatment. She clearly is showing [place] that she has chemotherapy responsive disease. I think as long she is done so well, we will proceed with systemic chemotherapy.   I will make a follow-up appointment for her in 2 weeks just to make sure that we have her on the schedule.    Volanda Napoleon, MD 3/20/20188:46 AM

## 2016-06-11 NOTE — Addendum Note (Signed)
Addended by: Burney Gauze R on: 06/11/2016 09:06 AM   Modules accepted: Orders

## 2016-06-11 NOTE — Patient Instructions (Signed)
Implanted Port Insertion, Care After °This sheet gives you information about how to care for yourself after your procedure. Your health care provider may also give you more specific instructions. If you have problems or questions, contact your health care provider. °What can I expect after the procedure? °After your procedure, it is common to have: °· Discomfort at the port insertion site. °· Bruising on the skin over the port. This should improve over 3-4 days. ° °Follow these instructions at home: °Port care °· After your port is placed, you will get a manufacturer's information card. The card has information about your port. Keep this card with you at all times. °· Take care of the port as told by your health care provider. Ask your health care provider if you or a family member can get training for taking care of the port at home. A home health care nurse may also take care of the port. °· Make sure to remember what type of port you have. °Incision care °· Follow instructions from your health care provider about how to take care of your port insertion site. Make sure you: °? Wash your hands with soap and water before you change your bandage (dressing). If soap and water are not available, use hand sanitizer. °? Change your dressing as told by your health care provider. °? Leave stitches (sutures), skin glue, or adhesive strips in place. These skin closures may need to stay in place for 2 weeks or longer. If adhesive strip edges start to loosen and curl up, you may trim the loose edges. Do not remove adhesive strips completely unless your health care provider tells you to do that. °· Check your port insertion site every day for signs of infection. Check for: °? More redness, swelling, or pain. °? More fluid or blood. °? Warmth. °? Pus or a bad smell. °General instructions °· Do not take baths, swim, or use a hot tub until your health care provider approves. °· Do not lift anything that is heavier than 10 lb (4.5  kg) for a week, or as told by your health care provider. °· Ask your health care provider when it is okay to: °? Return to work or school. °? Resume usual physical activities or sports. °· Do not drive for 24 hours if you were given a medicine to help you relax (sedative). °· Take over-the-counter and prescription medicines only as told by your health care provider. °· Wear a medical alert bracelet in case of an emergency. This will tell any health care providers that you have a port. °· Keep all follow-up visits as told by your health care provider. This is important. °Contact a health care provider if: °· You cannot flush your port with saline as directed, or you cannot draw blood from the port. °· You have a fever or chills. °· You have more redness, swelling, or pain around your port insertion site. °· You have more fluid or blood coming from your port insertion site. °· Your port insertion site feels warm to the touch. °· You have pus or a bad smell coming from the port insertion site. °Get help right away if: °· You have chest pain or shortness of breath. °· You have bleeding from your port that you cannot control. °Summary °· Take care of the port as told by your health care provider. °· Change your dressing as told by your health care provider. °· Keep all follow-up visits as told by your health care provider. °  This information is not intended to replace advice given to you by your health care provider. Make sure you discuss any questions you have with your health care provider. °Document Released: 12/30/2012 Document Revised: 01/31/2016 Document Reviewed: 01/31/2016 °Elsevier Interactive Patient Education © 2017 [employer] ° °

## 2016-06-11 NOTE — Patient Instructions (Addendum)
[facility] Discharge Instructions for Patients Receiving Chemotherapy  Today you received the following chemotherapy agents 5FU, Oxaliplatin, Camptosar  To help prevent nausea and vomiting after your treatment, we encourage you to take your nausea medication    If you develop nausea and vomiting that is not controlled by your nausea medication, call the clinic.   BELOW ARE SYMPTOMS THAT SHOULD BE REPORTED IMMEDIATELY:  *FEVER GREATER THAN 100.5 F  *CHILLS WITH OR WITHOUT FEVER  NAUSEA AND VOMITING THAT IS NOT CONTROLLED WITH YOUR NAUSEA MEDICATION  *UNUSUAL SHORTNESS OF BREATH  *UNUSUAL BRUISING OR BLEEDING  TENDERNESS IN MOUTH AND THROAT WITH OR WITHOUT PRESENCE OF ULCERS  *URINARY PROBLEMS  *BOWEL PROBLEMS  UNUSUAL RASH Items with * indicate a potential emergency and should be followed up as soon as possible.  Feel free to call the clinic you have any questions or concerns. The clinic phone number is [phone].  Please show the [place] at check-in to the Emergency Department and triage nurse.

## 2016-06-13 ENCOUNTER — Ambulatory Visit (HOSPITAL_BASED_OUTPATIENT_CLINIC_OR_DEPARTMENT_OTHER): Payer: BLUE CROSS/BLUE SHIELD

## 2016-06-13 VITALS — BP 111/74 | HR 74 | Temp 98.0°F | Resp 17

## 2016-06-13 DIAGNOSIS — C187 Malignant neoplasm of sigmoid colon: Secondary | ICD-10-CM | POA: Diagnosis not present

## 2016-06-13 DIAGNOSIS — C772 Secondary and unspecified malignant neoplasm of intra-abdominal lymph nodes: Principal | ICD-10-CM

## 2016-06-13 MED ORDER — SODIUM CHLORIDE 0.9% FLUSH
10.0000 mL | INTRAVENOUS | Status: DC | PRN
  Administered 2016-06-13: 10 mL
  Filled 2016-06-13: qty 10

## 2016-06-13 MED ORDER — HEPARIN SOD (PORK) LOCK FLUSH 100 UNIT/ML IV SOLN
500.0000 [IU] | Freq: Once | INTRAVENOUS | Status: AC | PRN
  Administered 2016-06-13: 500 [IU]
  Filled 2016-06-13: qty 5

## 2016-06-13 NOTE — Patient Instructions (Signed)
Implanted Port Insertion, Care After °This sheet gives you information about how to care for yourself after your procedure. Your health care provider may also give you more specific instructions. If you have problems or questions, contact your health care provider. °What can I expect after the procedure? °After your procedure, it is common to have: °· Discomfort at the port insertion site. °· Bruising on the skin over the port. This should improve over 3-4 days. ° °Follow these instructions at home: °Port care °· After your port is placed, you will get a manufacturer's information card. The card has information about your port. Keep this card with you at all times. °· Take care of the port as told by your health care provider. Ask your health care provider if you or a family member can get training for taking care of the port at home. A home health care nurse may also take care of the port. °· Make sure to remember what type of port you have. °Incision care °· Follow instructions from your health care provider about how to take care of your port insertion site. Make sure you: °? Wash your hands with soap and water before you change your bandage (dressing). If soap and water are not available, use hand sanitizer. °? Change your dressing as told by your health care provider. °? Leave stitches (sutures), skin glue, or adhesive strips in place. These skin closures may need to stay in place for 2 weeks or longer. If adhesive strip edges start to loosen and curl up, you may trim the loose edges. Do not remove adhesive strips completely unless your health care provider tells you to do that. °· Check your port insertion site every day for signs of infection. Check for: °? More redness, swelling, or pain. °? More fluid or blood. °? Warmth. °? Pus or a bad smell. °General instructions °· Do not take baths, swim, or use a hot tub until your health care provider approves. °· Do not lift anything that is heavier than 10 lb (4.5  kg) for a week, or as told by your health care provider. °· Ask your health care provider when it is okay to: °? Return to work or school. °? Resume usual physical activities or sports. °· Do not drive for 24 hours if you were given a medicine to help you relax (sedative). °· Take over-the-counter and prescription medicines only as told by your health care provider. °· Wear a medical alert bracelet in case of an emergency. This will tell any health care providers that you have a port. °· Keep all follow-up visits as told by your health care provider. This is important. °Contact a health care provider if: °· You cannot flush your port with saline as directed, or you cannot draw blood from the port. °· You have a fever or chills. °· You have more redness, swelling, or pain around your port insertion site. °· You have more fluid or blood coming from your port insertion site. °· Your port insertion site feels warm to the touch. °· You have pus or a bad smell coming from the port insertion site. °Get help right away if: °· You have chest pain or shortness of breath. °· You have bleeding from your port that you cannot control. °Summary °· Take care of the port as told by your health care provider. °· Change your dressing as told by your health care provider. °· Keep all follow-up visits as told by your health care provider. °  This information is not intended to replace advice given to you by your health care provider. Make sure you discuss any questions you have with your health care provider. °Document Released: 12/30/2012 Document Revised: 01/31/2016 Document Reviewed: 01/31/2016 °Elsevier Interactive Patient Education © 2017 [employer] ° °

## 2016-06-18 ENCOUNTER — Ambulatory Visit: Payer: BLUE CROSS/BLUE SHIELD | Admitting: Hematology & Oncology

## 2016-06-18 ENCOUNTER — Ambulatory Visit: Payer: BLUE CROSS/BLUE SHIELD

## 2016-06-18 ENCOUNTER — Other Ambulatory Visit: Payer: BLUE CROSS/BLUE SHIELD

## 2016-06-18 DIAGNOSIS — C189 Malignant neoplasm of colon, unspecified: Secondary | ICD-10-CM | POA: Diagnosis not present

## 2016-06-18 DIAGNOSIS — C786 Secondary malignant neoplasm of retroperitoneum and peritoneum: Secondary | ICD-10-CM | POA: Diagnosis not present

## 2016-06-18 DIAGNOSIS — R918 Other nonspecific abnormal finding of lung field: Secondary | ICD-10-CM | POA: Diagnosis not present

## 2016-06-18 DIAGNOSIS — C19 Malignant neoplasm of rectosigmoid junction: Secondary | ICD-10-CM | POA: Diagnosis not present

## 2016-06-25 ENCOUNTER — Ambulatory Visit: Payer: BLUE CROSS/BLUE SHIELD

## 2016-06-25 ENCOUNTER — Ambulatory Visit (HOSPITAL_BASED_OUTPATIENT_CLINIC_OR_DEPARTMENT_OTHER): Payer: BLUE CROSS/BLUE SHIELD | Admitting: Family

## 2016-06-25 ENCOUNTER — Ambulatory Visit (HOSPITAL_BASED_OUTPATIENT_CLINIC_OR_DEPARTMENT_OTHER): Payer: BLUE CROSS/BLUE SHIELD

## 2016-06-25 ENCOUNTER — Other Ambulatory Visit (HOSPITAL_BASED_OUTPATIENT_CLINIC_OR_DEPARTMENT_OTHER): Payer: BLUE CROSS/BLUE SHIELD

## 2016-06-25 VITALS — BP 109/68 | HR 72 | Temp 97.9°F | Resp 18 | Wt 150.0 lb

## 2016-06-25 DIAGNOSIS — K1230 Oral mucositis (ulcerative), unspecified: Secondary | ICD-10-CM

## 2016-06-25 DIAGNOSIS — K121 Other forms of stomatitis: Secondary | ICD-10-CM

## 2016-06-25 DIAGNOSIS — C189 Malignant neoplasm of colon, unspecified: Secondary | ICD-10-CM | POA: Diagnosis not present

## 2016-06-25 DIAGNOSIS — Z5111 Encounter for antineoplastic chemotherapy: Secondary | ICD-10-CM

## 2016-06-25 DIAGNOSIS — C187 Malignant neoplasm of sigmoid colon: Secondary | ICD-10-CM

## 2016-06-25 DIAGNOSIS — C772 Secondary and unspecified malignant neoplasm of intra-abdominal lymph nodes: Principal | ICD-10-CM

## 2016-06-25 LAB — CBC WITH DIFFERENTIAL (CANCER CENTER ONLY)
BASO#: 0 10*3/uL (ref 0.0–0.2)
BASO%: 0.4 % (ref 0.0–2.0)
EOS%: 2.1 % (ref 0.0–7.0)
Eosinophils Absolute: 0.1 10*3/uL (ref 0.0–0.5)
HEMATOCRIT: 38 % (ref 34.8–46.6)
HGB: 12.9 g/dL (ref 11.6–15.9)
LYMPH#: 1.5 10*3/uL (ref 0.9–3.3)
LYMPH%: 27.5 % (ref 14.0–48.0)
MCH: 29.5 pg (ref 26.0–34.0)
MCHC: 33.9 g/dL (ref 32.0–36.0)
MCV: 87 fL (ref 81–101)
MONO#: 0.6 10*3/uL (ref 0.1–0.9)
MONO%: 10.7 % (ref 0.0–13.0)
NEUT#: 3.2 10*3/uL (ref 1.5–6.5)
NEUT%: 59.3 % (ref 39.6–80.0)
PLATELETS: 283 10*3/uL (ref 145–400)
RBC: 4.37 10*6/uL (ref 3.70–5.32)
RDW: 14 % (ref 11.1–15.7)
WBC: 5.3 10*3/uL (ref 3.9–10.0)

## 2016-06-25 LAB — CMP (CANCER CENTER ONLY)
ALT(SGPT): 30 U/L (ref 10–47)
AST: 28 U/L (ref 11–38)
Albumin: 3.5 g/dL (ref 3.3–5.5)
Alkaline Phosphatase: 66 U/L (ref 26–84)
BILIRUBIN TOTAL: 0.6 mg/dL (ref 0.20–1.60)
BUN: 14 mg/dL (ref 7–22)
CALCIUM: 9 mg/dL (ref 8.0–10.3)
CO2: 27 meq/L (ref 18–33)
Chloride: 107 mEq/L (ref 98–108)
Creat: 0.7 mg/dl (ref 0.6–1.2)
GLUCOSE: 95 mg/dL (ref 73–118)
Potassium: 3.8 mEq/L (ref 3.3–4.7)
Sodium: 143 mEq/L (ref 128–145)
Total Protein: 6.7 g/dL (ref 6.4–8.1)

## 2016-06-25 LAB — CEA (IN HOUSE-CHCC): CEA (CHCC-IN HOUSE): 2.16 ng/mL (ref 0.00–5.00)

## 2016-06-25 MED ORDER — DEXAMETHASONE SODIUM PHOSPHATE 10 MG/ML IJ SOLN
10.0000 mg | Freq: Once | INTRAMUSCULAR | Status: AC
  Administered 2016-06-25: 10 mg via INTRAVENOUS

## 2016-06-25 MED ORDER — PALONOSETRON HCL INJECTION 0.25 MG/5ML
0.2500 mg | Freq: Once | INTRAVENOUS | Status: AC
  Administered 2016-06-25: 0.25 mg via INTRAVENOUS

## 2016-06-25 MED ORDER — DEXAMETHASONE SODIUM PHOSPHATE 10 MG/ML IJ SOLN
INTRAMUSCULAR | Status: AC
  Filled 2016-06-25: qty 1

## 2016-06-25 MED ORDER — MAGIC MOUTHWASH W/LIDOCAINE: 5.0000 mL | Freq: Three times a day (TID) | ORAL | 1 refills | Status: DC | PRN

## 2016-06-25 MED ORDER — SODIUM CHLORIDE 0.9 % IV SOLN
2560.0000 mg/m2 | INTRAVENOUS | Status: DC
  Administered 2016-06-25: 4550 mg via INTRAVENOUS
  Filled 2016-06-25: qty 91

## 2016-06-25 MED ORDER — DEXTROSE 5 % IV SOLN
Freq: Once | INTRAVENOUS | Status: AC
  Administered 2016-06-25: 10:00:00 via INTRAVENOUS

## 2016-06-25 MED ORDER — DEXTROSE 5 % IV SOLN
132.0000 mg/m2 | Freq: Once | INTRAVENOUS | Status: AC
  Administered 2016-06-25: 240 mg via INTRAVENOUS
  Filled 2016-06-25: qty 10

## 2016-06-25 MED ORDER — OXALIPLATIN CHEMO INJECTION 100 MG/20ML
68.0000 mg/m2 | Freq: Once | INTRAVENOUS | Status: AC
  Administered 2016-06-25: 120 mg via INTRAVENOUS
  Filled 2016-06-25: qty 20

## 2016-06-25 MED ORDER — PALONOSETRON HCL INJECTION 0.25 MG/5ML
INTRAVENOUS | Status: AC
  Filled 2016-06-25: qty 5

## 2016-06-25 MED ORDER — DEXTROSE 5 % IV SOLN
197.0000 mg/m2 | Freq: Once | INTRAVENOUS | Status: AC
  Administered 2016-06-25: 350 mg via INTRAVENOUS
  Filled 2016-06-25: qty 17.5

## 2016-06-25 MED FILL — MAGIC MOUTHWASH W/LIDO 1:1: 8 days supply | Qty: 120 | Fill #0

## 2016-06-25 NOTE — Progress Notes (Addendum)
Hematology and Oncology Follow Up Visit  Carla Mills [id number] March 03, 1971 46 y.o. 06/25/2016   Principle Diagnosis:  Recurrent colon cancer HER2 (-)Zachery Dakins stable/MMR normal/BRAF -  Current Therapy:   FOLFOXIRI every 2 weeks s/p cycle 5    Interim History:  Carla Mills is here today with her mother for follow-up. She is doing well and recently met with surgery Dr. Crisoforo Oxford. She is scheduled to have surgery on May 17th. She states that they plan to remove the cancer near her rectum and possibly give her a temporary ostomy if needed. She states that they may also do a hysterectomy while they have her there.  She had some bumps come up on her head that were sensitive when brushing her hair after her last cycle. No lesions or scabs. She also had some stomatitis. She was able to use water and baking soda and it was tolerable but she would like something a little stronger to have on hand if it comes back.  She will let us know if either of these issues return after today's treatment.  She is sensitive to cold for 7-9 days after each cycle and avoids.  No fever, chills, n/v, cough, dizziness, SOB, chest pain, palpitations, abdominal pain or changes in bowel or bladder habits.  No bleeding, bruising or petechiae. No lymphadenopathy found on exam.  No swelling, tenderness, numbness or tingling in her extremities. No c/o pain at this time.   Medications:  Allergies as of 06/25/2016      Reactions   Bactrim [sulfamethoxazole-trimethoprim]    Oxaprozin Hives   REACTION: Hives   Penicillins Hives   REACTION: Hives Has patient had a PCN reaction causing immediate rash, facial/tongue/throat swelling, SOB or lightheadedness with hypotension: no Has patient had a PCN reaction causing severe rash involving mucus membranes or skin necrosis: {no Has patient had a PCN reaction that required hospitalization no Has patient had a PCN reaction occurring within the last 10 years: no If all of the above answers are  "NO", then may proceed with Cephalosporin use.   Sulfonamide Derivatives Swelling   Massive extremity swelling       Medication List       Accurate as of 06/25/16  9:23 AM. Always use your most recent med list.          acetaminophen 500 MG tablet Commonly known as:  TYLENOL Take 1,000 mg by mouth every 8 (eight) hours as needed for moderate pain.   dexamethasone 4 MG tablet Commonly known as:  DECADRON Take 2 tablets (8 mg total) by mouth daily. Start the day after chemo and take for 2 days.   fexofenadine 180 MG tablet Commonly known as:  ALLEGRA Take 180 mg by mouth daily as needed for allergies.   fluticasone 50 MCG/ACT nasal spray Commonly known as:  FLONASE Place 1 spray into both nostrils 2 (two) times daily.   HYDROcodone-acetaminophen 5-325 MG tablet Commonly known as:  NORCO/VICODIN Take 1-2 tablets by mouth every 6 (six) hours as needed.   ibuprofen 200 MG tablet Commonly known as:  ADVIL,MOTRIN Take 400 mg by mouth every 6 (six) hours as needed for headache or mild pain.   lidocaine-prilocaine cream Commonly known as:  EMLA Apply 1 application topically as needed. Apply to [facility] 1 hour prior to treatment.   loperamide 2 MG capsule Commonly known as:  IMODIUM Take 1 capsule (2 mg total) by mouth as needed for diarrhea or loose stools. Take 2 capsules at the onset of diarrhea,  then 1 every 2 hours until 12 hours with no BM. May take 2 every 4 hours at night. If diarrhea recurrs, repeat.   LORazepam 1 MG tablet Commonly known as:  ATIVAN Take 1 tablet (1 mg total) by mouth every 6 (six) hours as needed for anxiety. Prn nausea/vomiting   ondansetron 8 MG tablet Commonly known as:  ZOFRAN Take 1 tablet (8 mg total) by mouth every 8 (eight) hours as needed for nausea or vomiting. Start Day 3 after chemo.   prochlorperazine 10 MG tablet Commonly known as:  COMPAZINE Take 1 tablet (10 mg total) by mouth every 6 (six) hours as needed for nausea or vomiting.     promethazine 25 MG tablet Commonly known as:  PHENERGAN Take 1 tablet (25 mg total) by mouth every 6 (six) hours as needed for nausea or vomiting.   traMADol 50 MG tablet Commonly known as:  ULTRAM Take 1 tablet (50 mg total) by mouth every 6 (six) hours as needed.       Allergies:  Allergies  Allergen Reactions  . Bactrim [Sulfamethoxazole-Trimethoprim]   . Oxaprozin Hives    REACTION: Hives  . Penicillins Hives    REACTION: Hives Has patient had a PCN reaction causing immediate rash, facial/tongue/throat swelling, SOB or lightheadedness with hypotension: no Has patient had a PCN reaction causing severe rash involving mucus membranes or skin necrosis: {no Has patient had a PCN reaction that required hospitalization no Has patient had a PCN reaction occurring within the last 10 years: no If all of the above answers are "NO", then may proceed with Cephalosporin use.  . Sulfonamide Derivatives Swelling    Massive extremity swelling     Past Medical History, Surgical history, Social history, and Family History were reviewed and updated.  Review of Systems: All other 10 point review of systems is negative.   Physical Exam:  weight is 150 lb (68 kg). Her oral temperature is 97.9 F (36.6 C). Her blood pressure is 109/68 and her pulse is 72. Her respiration is 18 and oxygen saturation is 100%.   Wt Readings from Last 3 Encounters:  06/25/16 150 lb (68 kg)  06/11/16 154 lb 1.9 oz (69.9 kg)  05/25/16 152 lb 1.9 oz (69 kg)    Ocular: Sclerae unicteric, pupils equal, round and reactive to light Ear-nose-throat: Oropharynx clear, dentition fair Lymphatic: No cervical, supraclavicular or axillary adenopathy Lungs no rales or rhonchi, good excursion bilaterally Heart regular rate and rhythm, no murmur appreciated Abd soft, nontender, positive bowel sounds, no liver or spleen tip palpated on exam, no fluid wave MSK no focal spinal tenderness, no joint edema Neuro: non-focal,  well-oriented, appropriate affect Breasts: Deferred  Lab Results  Component Value Date   WBC 5.3 06/25/2016   HGB 12.9 06/25/2016   HCT 38.0 06/25/2016   MCV 87 06/25/2016   PLT 283 06/25/2016   Lab Results  Component Value Date   FERRITIN 88 06/22/2015   IRON 98 06/22/2015   TIBC 374 06/22/2015   UIBC 275 06/22/2015   IRONPCTSAT 26 06/22/2015   Lab Results  Component Value Date   RETICCTPCT 0.8 02/12/2015   RBC 4.37 06/25/2016   No results found for: KPAFRELGTCHN, LAMBDASER, KAPLAMBRATIO No results found for: IGGSERUM, IGA, IGMSERUM No results found for: Odetta Pink, SPEI   Chemistry      Component Value Date/Time   NA 143 06/25/2016 0759   NA 140 05/28/2016 0813   K 3.8 06/25/2016  0759   K 3.8 05/28/2016 0813   CL 107 06/25/2016 0759   CO2 27 06/25/2016 0759   CO2 23 05/28/2016 0813   BUN 14 06/25/2016 0759   BUN 12.2 05/28/2016 0813   CREATININE 0.7 06/25/2016 0759   CREATININE 0.7 05/28/2016 0813      Component Value Date/Time   CALCIUM 9.0 06/25/2016 0759   CALCIUM 8.9 05/28/2016 0813   ALKPHOS 66 06/25/2016 0759   ALKPHOS 72 05/28/2016 0813   AST 28 06/25/2016 0759   AST 19 05/28/2016 0813   ALT 30 06/25/2016 0759   ALT 34 05/28/2016 0813   BILITOT 0.60 06/25/2016 0759   BILITOT 0.22 05/28/2016 0813     Impression and Plan: Carla Mills is a 46 yo white female with recurrent colon cancer. She has done well with FOLFOXIRI and has completed 5 cycles so far. She has seen surgery Dr. Crisoforo Oxford and has scheduled her procedure for May 17th. She is doing well and has no complaints at this time.  I gave her a prescription for Magic mouth wash for any recurrent stomatitis. We will proceed with cycle 6 today and plan for one more treatment in 2 weeks (April 17th) giving her a one month break from treatment prior to her surgery.  We will plan to resume treatment 2 months after surgery.  She will get a new calendar  today before she leaves.  Both she and her family know to contact our office with any questions or concerns. We can certainly see her sooner if need be.   Eliezer Bottom, NP 4/3/20189:23 AM

## 2016-06-27 ENCOUNTER — Ambulatory Visit (HOSPITAL_BASED_OUTPATIENT_CLINIC_OR_DEPARTMENT_OTHER): Payer: BLUE CROSS/BLUE SHIELD

## 2016-06-27 VITALS — BP 113/65 | HR 68 | Temp 97.8°F | Resp 17

## 2016-06-27 DIAGNOSIS — C772 Secondary and unspecified malignant neoplasm of intra-abdominal lymph nodes: Secondary | ICD-10-CM

## 2016-06-27 DIAGNOSIS — C187 Malignant neoplasm of sigmoid colon: Secondary | ICD-10-CM | POA: Diagnosis not present

## 2016-06-27 MED ORDER — SODIUM CHLORIDE 0.9% FLUSH
10.0000 mL | INTRAVENOUS | Status: DC | PRN
  Administered 2016-06-27: 10 mL
  Filled 2016-06-27: qty 10

## 2016-06-27 MED ORDER — HEPARIN SOD (PORK) LOCK FLUSH 100 UNIT/ML IV SOLN
500.0000 [IU] | Freq: Once | INTRAVENOUS | Status: AC | PRN
  Administered 2016-06-27: 500 [IU]
  Filled 2016-06-27: qty 5

## 2016-07-09 ENCOUNTER — Ambulatory Visit: Payer: BLUE CROSS/BLUE SHIELD

## 2016-07-09 ENCOUNTER — Ambulatory Visit (HOSPITAL_BASED_OUTPATIENT_CLINIC_OR_DEPARTMENT_OTHER): Payer: BLUE CROSS/BLUE SHIELD

## 2016-07-09 ENCOUNTER — Ambulatory Visit (HOSPITAL_BASED_OUTPATIENT_CLINIC_OR_DEPARTMENT_OTHER): Payer: BLUE CROSS/BLUE SHIELD | Admitting: Hematology & Oncology

## 2016-07-09 ENCOUNTER — Other Ambulatory Visit (HOSPITAL_BASED_OUTPATIENT_CLINIC_OR_DEPARTMENT_OTHER): Payer: BLUE CROSS/BLUE SHIELD

## 2016-07-09 VITALS — BP 116/78 | HR 76 | Temp 98.1°F | Resp 17 | Wt 161.1 lb

## 2016-07-09 DIAGNOSIS — C772 Secondary and unspecified malignant neoplasm of intra-abdominal lymph nodes: Principal | ICD-10-CM

## 2016-07-09 DIAGNOSIS — K121 Other forms of stomatitis: Secondary | ICD-10-CM

## 2016-07-09 DIAGNOSIS — C189 Malignant neoplasm of colon, unspecified: Secondary | ICD-10-CM

## 2016-07-09 DIAGNOSIS — C187 Malignant neoplasm of sigmoid colon: Secondary | ICD-10-CM

## 2016-07-09 DIAGNOSIS — Z5111 Encounter for antineoplastic chemotherapy: Secondary | ICD-10-CM | POA: Diagnosis not present

## 2016-07-09 DIAGNOSIS — K1230 Oral mucositis (ulcerative), unspecified: Secondary | ICD-10-CM

## 2016-07-09 LAB — CBC WITH DIFFERENTIAL (CANCER CENTER ONLY)
BASO#: 0 10*3/uL (ref 0.0–0.2)
BASO%: 0.4 % (ref 0.0–2.0)
EOS ABS: 0.1 10*3/uL (ref 0.0–0.5)
EOS%: 2.3 % (ref 0.0–7.0)
HEMATOCRIT: 37.1 % (ref 34.8–46.6)
HGB: 12.7 g/dL (ref 11.6–15.9)
LYMPH#: 1.4 10*3/uL (ref 0.9–3.3)
LYMPH%: 25.3 % (ref 14.0–48.0)
MCH: 30 pg (ref 26.0–34.0)
MCHC: 34.2 g/dL (ref 32.0–36.0)
MCV: 88 fL (ref 81–101)
MONO#: 0.6 10*3/uL (ref 0.1–0.9)
MONO%: 11.2 % (ref 0.0–13.0)
NEUT#: 3.4 10*3/uL (ref 1.5–6.5)
NEUT%: 60.8 % (ref 39.6–80.0)
Platelets: 203 10*3/uL (ref 145–400)
RBC: 4.24 10*6/uL (ref 3.70–5.32)
RDW: 14.5 % (ref 11.1–15.7)
WBC: 5.6 10*3/uL (ref 3.9–10.0)

## 2016-07-09 LAB — CMP (CANCER CENTER ONLY)
ALK PHOS: 70 U/L (ref 26–84)
ALT: 43 U/L (ref 10–47)
AST: 39 U/L — ABNORMAL HIGH (ref 11–38)
Albumin: 3.5 g/dL (ref 3.3–5.5)
BUN, Bld: 10 mg/dL (ref 7–22)
CO2: 27 mEq/L (ref 18–33)
Calcium: 8.9 mg/dL (ref 8.0–10.3)
Chloride: 104 mEq/L (ref 98–108)
Creat: 0.8 mg/dl (ref 0.6–1.2)
Glucose, Bld: 85 mg/dL (ref 73–118)
POTASSIUM: 3.9 meq/L (ref 3.3–4.7)
Sodium: 138 mEq/L (ref 128–145)
TOTAL PROTEIN: 6.4 g/dL (ref 6.4–8.1)
Total Bilirubin: 0.7 mg/dl (ref 0.20–1.60)

## 2016-07-09 LAB — CEA (IN HOUSE-CHCC): CEA (CHCC-In House): 2.2 ng/mL (ref 0.00–5.00)

## 2016-07-09 MED ORDER — OXALIPLATIN CHEMO INJECTION 100 MG/20ML
68.0000 mg/m2 | Freq: Once | INTRAVENOUS | Status: AC
  Administered 2016-07-09: 120 mg via INTRAVENOUS
  Filled 2016-07-09: qty 20

## 2016-07-09 MED ORDER — PALONOSETRON HCL INJECTION 0.25 MG/5ML
0.2500 mg | Freq: Once | INTRAVENOUS | Status: AC
  Administered 2016-07-09: 0.25 mg via INTRAVENOUS

## 2016-07-09 MED ORDER — DEXTROSE 5 % IV SOLN
Freq: Once | INTRAVENOUS | Status: AC
  Administered 2016-07-09: 11:00:00 via INTRAVENOUS

## 2016-07-09 MED ORDER — LORAZEPAM 1 MG PO TABS: 1.0000 mg | ORAL_TABLET | Freq: Four times a day (QID) | ORAL | 0 refills | Status: DC | PRN

## 2016-07-09 MED ORDER — DEXAMETHASONE SODIUM PHOSPHATE 10 MG/ML IJ SOLN
INTRAMUSCULAR | Status: AC
  Filled 2016-07-09: qty 1

## 2016-07-09 MED ORDER — DEXAMETHASONE 4 MG PO TABS: 8.0000 mg | ORAL_TABLET | Freq: Every day | ORAL | 0 refills | Status: DC

## 2016-07-09 MED ORDER — PALONOSETRON HCL INJECTION 0.25 MG/5ML
INTRAVENOUS | Status: AC
  Filled 2016-07-09: qty 5

## 2016-07-09 MED ORDER — ATROPINE SULFATE 1 MG/ML IJ SOLN
INTRAMUSCULAR | Status: AC
  Filled 2016-07-09: qty 1

## 2016-07-09 MED ORDER — SODIUM CHLORIDE 0.9 % IV SOLN
2560.0000 mg/m2 | INTRAVENOUS | Status: DC
  Administered 2016-07-09: 4550 mg via INTRAVENOUS
  Filled 2016-07-09: qty 91

## 2016-07-09 MED ORDER — ATROPINE SULFATE 1 MG/ML IJ SOLN: 0.5000 mg | Freq: Once | INTRAMUSCULAR | Status: DC | PRN

## 2016-07-09 MED ORDER — DEXAMETHASONE SODIUM PHOSPHATE 10 MG/ML IJ SOLN
10.0000 mg | Freq: Once | INTRAMUSCULAR | Status: AC
  Administered 2016-07-09: 10 mg via INTRAVENOUS

## 2016-07-09 MED ORDER — IRINOTECAN HCL CHEMO INJECTION 100 MG/5ML
132.0000 mg/m2 | Freq: Once | INTRAVENOUS | Status: AC
  Administered 2016-07-09: 240 mg via INTRAVENOUS
  Filled 2016-07-09: qty 10

## 2016-07-09 MED ORDER — LEUCOVORIN CALCIUM INJECTION 350 MG
197.0000 mg/m2 | Freq: Once | INTRAVENOUS | Status: AC
  Administered 2016-07-09: 350 mg via INTRAVENOUS
  Filled 2016-07-09: qty 17.5

## 2016-07-09 MED FILL — DEXAMETHASONE 4 MG TABLET: 4 | 12 days supply | Qty: 24 | Fill #0

## 2016-07-09 MED FILL — LORazepam 1 MG TABS: 1 | 8 days supply | Qty: 30 | Fill #0

## 2016-07-09 NOTE — Patient Instructions (Signed)
Implanted Port Home Guide An implanted port is a type of central line that is placed under the skin. Central lines are used to provide IV access when treatment or nutrition needs to be given through a person's veins. Implanted ports are used for long-term IV access. An implanted port may be placed because:  You need IV medicine that would be irritating to the small veins in your hands or arms.  You need long-term IV medicines, such as antibiotics.  You need IV nutrition for a long period.  You need frequent blood draws for lab tests.  You need dialysis.  Implanted ports are usually placed in the chest area, but they can also be placed in the upper arm, the abdomen, or the leg. An implanted port has two main parts:  Reservoir. The reservoir is round and will appear as a small, raised area under your skin. The reservoir is the part where a needle is inserted to give medicines or draw blood.  Catheter. The catheter is a thin, flexible tube that extends from the reservoir. The catheter is placed into a large vein. Medicine that is inserted into the reservoir goes into the catheter and then into the vein.  How will I care for my incision site? Do not get the incision site wet. Bathe or shower as directed by your health care provider. How is my port accessed? Special steps must be taken to access the port:  Before the port is accessed, a numbing cream can be placed on the skin. This helps numb the skin over the port site.  Your health care provider uses a sterile technique to access the port. ? Your health care provider must put on a mask and sterile gloves. ? The skin over your port is cleaned carefully with an antiseptic and allowed to dry. ? The port is gently pinched between sterile gloves, and a needle is inserted into the port.  Only "non-coring" port needles should be used to access the port. Once the port is accessed, a blood return should be checked. This helps ensure that the port  is in the vein and is not clogged.  If your port needs to remain accessed for a constant infusion, a clear (transparent) bandage will be placed over the needle site. The bandage and needle will need to be changed every week, or as directed by your health care provider.  Keep the bandage covering the needle clean and dry. Do not get it wet. Follow your health care provider's instructions on how to take a shower or bath while the port is accessed.  If your port does not need to stay accessed, no bandage is needed over the port.  What is flushing? Flushing helps keep the port from getting clogged. Follow your health care provider's instructions on how and when to flush the port. Ports are usually flushed with saline solution or a medicine called heparin. The need for flushing will depend on how the port is used.  If the port is used for intermittent medicines or blood draws, the port will need to be flushed: ? After medicines have been given. ? After blood has been drawn. ? As part of routine maintenance.  If a constant infusion is running, the port may not need to be flushed.  How long will my port stay implanted? The port can stay in for as long as your health care provider thinks it is needed. When it is time for the port to come out, surgery will be   done to remove it. The procedure is similar to the one performed when the port was put in. When should I seek immediate medical care? When you have an implanted port, you should seek immediate medical care if:  You notice a bad smell coming from the incision site.  You have swelling, redness, or drainage at the incision site.  You have more swelling or pain at the port site or the surrounding area.  You have a fever that is not controlled with medicine.  This information is not intended to replace advice given to you by your health care provider. Make sure you discuss any questions you have with your health care provider. Document  Released: 03/11/2005 Document Revised: 08/17/2015 Document Reviewed: 11/16/2012 Elsevier Interactive Patient Education  2017 [employer]  

## 2016-07-09 NOTE — Addendum Note (Signed)
Addended by: Burney Gauze R on: 07/09/2016 08:57 AM   Modules accepted: Orders

## 2016-07-09 NOTE — Progress Notes (Signed)
Hematology and Oncology Follow Up Visit  Carla Mills [id number] 1971/03/13 46 y.o. 07/09/2016   Principle Diagnosis:   Recurrent colon cancer - HER2 (-)/MSI stable/MMR normal/BRAF -  Current Therapy:     FOLFOXIRI on 04/09/2016 - s/p cycle #6     Interim History:  Carla Mills is back for follow-up. She has done very well. She really has had very few side effects of therapy.  This will be her last cycle of chemotherapy prior to her surgery. She will go to surgery at [facility] on May 17.  She is feeling well. 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 This is not stop her from doing what she likes to do. Patient walked quite a bit last week with the nice weather. She's had no problems with bowels or bladder. She's had no bleeding. He's had no bowel source patient had no cough or shortness of breath.  She's had no fever. She's had no rashes.  Overall, she is Handal therapy incredibly well. She is responded quite nicely by her last PET scan.  Her last CEA was 2.16  Overall, her performance status is ECOG 0.  Medications:  Current Outpatient Prescriptions:  .  acetaminophen (TYLENOL) 500 MG tablet, Take 1,000 mg by mouth every 8 (eight) hours as needed for moderate pain. , Disp: , Rfl:  .  dexamethasone (DECADRON) 4 MG tablet, Take 2 tablets (8 mg total) by mouth daily. Start the day after chemo and take for 2 days., Disp: 24 tablet, Rfl: 0 .  fexofenadine (ALLEGRA) 180 MG tablet, Take 180 mg by mouth daily as needed for allergies. , Disp: , Rfl:  .  fluticasone (FLONASE) 50 MCG/ACT nasal spray, Place 1 spray into both nostrils 2 (two) times daily. , Disp: , Rfl:  .  HYDROcodone-acetaminophen (NORCO/VICODIN) 5-325 MG tablet, Take 1-2 tablets by mouth every 6 (six) hours as needed., Disp: 15 tablet, Rfl: 0 .  ibuprofen (ADVIL,MOTRIN) 200 MG tablet, Take 400 mg by mouth every 6 (six) hours as needed for headache or mild pain., Disp: , Rfl:  .  lidocaine-prilocaine (EMLA) cream,  Apply 1 application topically as needed. Apply to [facility] 1 hour prior to treatment., Disp: 30 g, Rfl: 3 .  loperamide (IMODIUM) 2 MG capsule, Take 1 capsule (2 mg total) by mouth as needed for diarrhea or loose stools. Take 2 capsules at the onset of diarrhea, then 1 every 2 hours until 12 hours with no BM. May take 2 every 4 hours at night. If diarrhea recurrs, repeat., Disp: 100 capsule, Rfl: 0 .  LORazepam (ATIVAN) 1 MG tablet, Take 1 tablet (1 mg total) by mouth every 6 (six) hours as needed for anxiety. Prn nausea/vomiting, Disp: 30 tablet, Rfl: 0 .  magic mouthwash w/lidocaine SOLN, Take 5 mLs by mouth 3 (three) times daily as needed for mouth pain., Disp: 120 mL, Rfl: 1 .  nystatin (MYCOSTATIN) 100000 UNIT/ML suspension, 100,000 Units., Disp: , Rfl: 0 .  ondansetron (ZOFRAN) 8 MG tablet, Take 1 tablet (8 mg total) by mouth every 8 (eight) hours as needed for nausea or vomiting. Start Day 3 after chemo., Disp: 20 tablet, Rfl: 0 .  prochlorperazine (COMPAZINE) 10 MG tablet, Take 1 tablet (10 mg total) by mouth every 6 (six) hours as needed for nausea or vomiting., Disp: 30 tablet, Rfl: 1 .  promethazine (PHENERGAN) 25 MG tablet, Take 1 tablet (25 mg total) by mouth every 6 (six) hours as needed for nausea or vomiting., Disp: 12 tablet, Rfl:  0 .  traMADol (ULTRAM) 50 MG tablet, Take 1 tablet (50 mg total) by mouth every 6 (six) hours as needed. (Patient taking differently: Take 50 mg by mouth every 6 (six) hours as needed for moderate pain. ), Disp: 90 tablet, Rfl: 0 No current facility-administered medications for this visit.   Facility-Administered Medications Ordered in Other Visits:  .  clindamycin (CLEOCIN) 900 mg in dextrose 5 % 50 mL IVPB, 900 mg, Intravenous, 60 min Pre-Op **AND** gentamicin (GARAMYCIN) 340 mg in dextrose 5 % 50 mL IVPB, 5 mg/kg, Intravenous, 60 min Pre-Op, [name], MD .  dextrose 5 % solution, , Intravenous, Continuous, Volanda Napoleon, MD, Stopped at 04/09/16 1418 .   heparin lock flush 100 unit/mL, 500 Units, Intracatheter, Once PRN, Volanda Napoleon, MD .  sodium chloride flush (NS) 0.9 % injection 10 mL, 10 mL, Intracatheter, PRN, Volanda Napoleon, MD  Allergies:  Allergies  Allergen Reactions  . Bactrim [Sulfamethoxazole-Trimethoprim]   . Oxaprozin Hives    REACTION: Hives  . Penicillins Hives    REACTION: Hives Has patient had a PCN reaction causing immediate rash, facial/tongue/throat swelling, SOB or lightheadedness with hypotension: no Has patient had a PCN reaction causing severe rash involving mucus membranes or skin necrosis: {no Has patient had a PCN reaction that required hospitalization no Has patient had a PCN reaction occurring within the last 10 years: no If all of the above answers are "NO", then may proceed with Cephalosporin use.  . Sulfonamide Derivatives Swelling    Massive extremity swelling     Past Medical History, Surgical history, Social history, and Family History were reviewed and updated.  Review of Systems:  As above  Physical Exam:  weight is 161 lb 1.3 oz (73.1 kg). Her oral temperature is 98.1 F (36.7 C). Her blood pressure is 116/78 and her pulse is 76. Her respiration is 17 and oxygen saturation is 100%.   Wt Readings from Last 3 Encounters:  07/09/16 161 lb 1.3 oz (73.1 kg)  06/25/16 150 lb (68 kg)  06/11/16 154 lb 1.9 oz (69.9 kg)      Well-developed and well-nourished white female in no obvious distress. Head and neck exam shows no ocular or oral lesions. She has no palpable cervical or supraclavicular lymph nodes. Lungs are clear. Cardiac exam regular rate and rhythm with no murmurs, rubs or bruits. Abdomen is soft. She has well-healed laparotomy scar. She has no fluid wave. There is no palpable liver or spleen tip. There is no obvious abdominal mass. She has no palpable inguinal lymph nodes. Back exam shows no tenderness over the spine, ribs or hips. Extremities shows no clubbing, cyanosis or edema. She's  good range motion of her joints. Skin exam shows no rashes, ecchymoses or petechia. Neurological exam shows no focal neurological deficits.  Lab Results  Component Value Date   WBC 5.6 07/09/2016   HGB 12.7 07/09/2016   HCT 37.1 07/09/2016   MCV 88 07/09/2016   PLT 203 07/09/2016     Chemistry      Component Value Date/Time   NA 143 06/25/2016 0759   NA 140 05/28/2016 0813   K 3.8 06/25/2016 0759   K 3.8 05/28/2016 0813   CL 107 06/25/2016 0759   CO2 27 06/25/2016 0759   CO2 23 05/28/2016 0813   BUN 14 06/25/2016 0759   BUN 12.2 05/28/2016 0813   CREATININE 0.7 06/25/2016 0759   CREATININE 0.7 05/28/2016 0813      Component Value  Date/Time   CALCIUM 9.0 06/25/2016 0759   CALCIUM 8.9 05/28/2016 0813   ALKPHOS 66 06/25/2016 0759   ALKPHOS 72 05/28/2016 0813   AST 28 06/25/2016 0759   AST 19 05/28/2016 0813   ALT 30 06/25/2016 0759   ALT 34 05/28/2016 0813   BILITOT 0.60 06/25/2016 0759   BILITOT 0.22 05/28/2016 0813         Impression and Plan: Carla Mills is a 46 year old white female. She initially presented with stage II colon cancer back in I think December 2016. She had a very high Oncotype score of 37. Because of this, we went ahead and gave her adjuvant chemotherapy with Xeloda. Despite this, her cancer has recurred.  I feel that she will do quite well with the HIPEC procedure. I know that this is an incredibly aggressive procedure. However, she is young, in great shape, and has a very good performance status. Hopefully, she will not be left with an ostomy. If she is left with one, then hopefully this will be temporary.  We will plan to see her back prior to her surgery. I will like to make sure that all of her labs look good.  Volanda Napoleon, MD 4/17/20188:47 AM

## 2016-07-09 NOTE — Patient Instructions (Signed)
Leucovorin injection What is this medicine? LEUCOVORIN (loo koe VOR in) is used to prevent or treat the harmful effects of some medicines. This medicine is used to treat anemia caused by a low amount of folic acid in the body. It is also used with 5-fluorouracil (5-FU) to treat colon cancer. This medicine may be used for other purposes; ask your health care provider or pharmacist if you have questions. What should I tell my health care provider before I take this medicine? They need to know if you have any of these conditions: -anemia from low levels of vitamin B-12 in the blood -an unusual or allergic reaction to leucovorin, folic acid, other medicines, foods, dyes, or preservatives -pregnant or trying to get pregnant -breast-feeding How should I use this medicine? This medicine is for injection into a muscle or into a vein. It is given by a health care professional in a hospital or clinic setting. Talk to your pediatrician regarding the use of this medicine in children. Special care may be needed. Overdosage: If you think you have taken too much of this medicine contact a poison control center or emergency room at once. NOTE: This medicine is only for you. Do not share this medicine with others. What if I miss a dose? This does not apply. What may interact with this medicine? -capecitabine -fluorouracil -phenobarbital -phenytoin -primidone -trimethoprim-sulfamethoxazole This list may not describe all possible interactions. Give your health care provider a list of all the medicines, herbs, non-prescription drugs, or dietary supplements you use. Also tell them if you smoke, drink alcohol, or use illegal drugs. Some items may interact with your medicine. What should I watch for while using this medicine? Your condition will be monitored carefully while you are receiving this medicine. This medicine may increase the side effects of 5-fluorouracil, 5-FU. Tell your doctor or health care  professional if you have diarrhea or mouth sores that do not get better or that get worse. What side effects may I notice from receiving this medicine? Side effects that you should report to your doctor or health care professional as soon as possible: -allergic reactions like skin rash, itching or hives, swelling of the face, lips, or tongue -breathing problems -fever, infection -mouth sores -unusual bleeding or bruising -unusually weak or tired Side effects that usually do not require medical attention (report to your doctor or health care professional if they continue or are bothersome): -constipation or diarrhea -loss of appetite -nausea, vomiting This list may not describe all possible side effects. Call your doctor for medical advice about side effects. You may report side effects to FDA at 1-800-FDA-1088. Where should I keep my medicine? This drug is given in a hospital or clinic and will not be stored at home. NOTE: This sheet is a summary. It may not cover all possible information. If you have questions about this medicine, talk to your doctor, pharmacist, or health care provider.  2018 [employer] Standard (2007-09-15 16:50:29) Fluorouracil, 5-FU injection What is this medicine? FLUOROURACIL, 5-FU (flure oh YOOR a sil) is a chemotherapy drug. It slows the growth of cancer cells. This medicine is used to treat many types of cancer like breast cancer, colon or rectal cancer, pancreatic cancer, and stomach cancer. This medicine may be used for other purposes; ask your health care provider or pharmacist if you have questions. COMMON BRAND NAME(S): Adrucil What should I tell my health care provider before I take this medicine? They need to know if you have any of these conditions: -  blood disorders -dihydropyrimidine dehydrogenase (DPD) deficiency -infection (especially a virus infection such as chickenpox, cold sores, or herpes) -kidney disease -liver disease -malnourished, poor  nutrition -recent or ongoing radiation therapy -an unusual or allergic reaction to fluorouracil, other chemotherapy, other medicines, foods, dyes, or preservatives -pregnant or trying to get pregnant -breast-feeding How should I use this medicine? This drug is given as an infusion or injection into a vein. It is administered in a hospital or clinic by a specially trained health care professional. Talk to your pediatrician regarding the use of this medicine in children. Special care may be needed. Overdosage: If you think you have taken too much of this medicine contact a poison control center or emergency room at once. NOTE: This medicine is only for you. Do not share this medicine with others. What if I miss a dose? It is important not to miss your dose. Call your doctor or health care professional if you are unable to keep an appointment. What may interact with this medicine? -allopurinol -cimetidine -dapsone -digoxin -hydroxyurea -leucovorin -levamisole -medicines for seizures like ethotoin, fosphenytoin, phenytoin -medicines to increase blood counts like filgrastim, pegfilgrastim, sargramostim -medicines that treat or prevent blood clots like warfarin, enoxaparin, and dalteparin -methotrexate -metronidazole -pyrimethamine -some other chemotherapy drugs like busulfan, cisplatin, estramustine, vinblastine -trimethoprim -trimetrexate -vaccines Talk to your doctor or health care professional before taking any of these medicines: -acetaminophen -aspirin -ibuprofen -ketoprofen -naproxen This list may not describe all possible interactions. Give your health care provider a list of all the medicines, herbs, non-prescription drugs, or dietary supplements you use. Also tell them if you smoke, drink alcohol, or use illegal drugs. Some items may interact with your medicine. What should I watch for while using this medicine? Visit your doctor for checks on your progress. This drug may  make you feel generally unwell. This is not uncommon, as chemotherapy can affect healthy cells as well as cancer cells. Report any side effects. Continue your course of treatment even though you feel ill unless your doctor tells you to stop. In some cases, you may be given additional medicines to help with side effects. Follow all directions for their use. Call your doctor or health care professional for advice if you get a fever, chills or sore throat, or other symptoms of a cold or flu. Do not treat yourself. This drug decreases your body's ability to fight infections. Try to avoid being around people who are sick. This medicine may increase your risk to bruise or bleed. Call your doctor or health care professional if you notice any unusual bleeding. Be careful brushing and flossing your teeth or using a toothpick because you may get an infection or bleed more easily. If you have any dental work done, tell your dentist you are receiving this medicine. Avoid taking products that contain aspirin, acetaminophen, ibuprofen, naproxen, or ketoprofen unless instructed by your doctor. These medicines may hide a fever. Do not become pregnant while taking this medicine. Women should inform their doctor if they wish to become pregnant or think they might be pregnant. There is a potential for serious side effects to an unborn child. Talk to your health care professional or pharmacist for more information. Do not breast-feed an infant while taking this medicine. Men should inform their doctor if they wish to father a child. This medicine may lower sperm counts. Do not treat diarrhea with over the counter products. Contact your doctor if you have diarrhea that lasts more than 2 days or if it   is severe and watery. This medicine can make you more sensitive to the sun. Keep out of the sun. If you cannot avoid being in the sun, wear protective clothing and use sunscreen. Do not use sun lamps or tanning beds/booths. What  side effects may I notice from receiving this medicine? Side effects that you should report to your doctor or health care professional as soon as possible: -allergic reactions like skin rash, itching or hives, swelling of the face, lips, or tongue -low blood counts - this medicine may decrease the number of white blood cells, red blood cells and platelets. You may be at increased risk for infections and bleeding. -signs of infection - fever or chills, cough, sore throat, pain or difficulty passing urine -signs of decreased platelets or bleeding - bruising, pinpoint red spots on the skin, black, tarry stools, blood in the urine -signs of decreased red blood cells - unusually weak or tired, fainting spells, lightheadedness -breathing problems -changes in vision -chest pain -mouth sores -nausea and vomiting -pain, swelling, redness at site where injected -pain, tingling, numbness in the hands or feet -redness, swelling, or sores on hands or feet -stomach pain -unusual bleeding Side effects that usually do not require medical attention (report to your doctor or health care professional if they continue or are bothersome): -changes in finger or toe nails -diarrhea -dry or itchy skin -hair loss -headache -loss of appetite -sensitivity of eyes to the light -stomach upset -unusually teary eyes This list may not describe all possible side effects. Call your doctor for medical advice about side effects. You may report side effects to FDA at 1-800-FDA-1088. Where should I keep my medicine? This drug is given in a hospital or clinic and will not be stored at home. NOTE: This sheet is a summary. It may not cover all possible information. If you have questions about this medicine, talk to your doctor, pharmacist, or health care provider.  2018 [employer] Standard (2007-07-15 13:53:16) Irinotecan injection What is this medicine? IRINOTECAN (ir in oh TEE kan ) is a chemotherapy drug. It is used  to treat colon and rectal cancer. This medicine may be used for other purposes; ask your health care provider or pharmacist if you have questions. COMMON BRAND NAME(S): Camptosar What should I tell my health care provider before I take this medicine? They need to know if you have any of these conditions: -blood disorders -dehydration -diarrhea -infection (especially a virus infection such as chickenpox, cold sores, or herpes) -liver disease -low blood counts, like low white cell, platelet, or red cell counts -recent or ongoing radiation therapy -an unusual or allergic reaction to irinotecan, sorbitol, other chemotherapy, other medicines, foods, dyes, or preservatives -pregnant or trying to get pregnant -breast-feeding How should I use this medicine? This drug is given as an infusion into a vein. It is administered in a hospital or clinic by a specially trained health care professional. Talk to your pediatrician regarding the use of this medicine in children. Special care may be needed. Overdosage: If you think you have taken too much of this medicine contact a poison control center or emergency room at once. NOTE: This medicine is only for you. Do not share this medicine with others. What if I miss a dose? It is important not to miss your dose. Call your doctor or health care professional if you are unable to keep an appointment. What may interact with this medicine? Do not take this medicine with any of the following medications: -atazanavir -  certain medicines for fungal infections like itraconazole and ketoconazole -St. John's Wort This medicine may also interact with the following medications: -dexamethasone -diuretics -laxatives -medicines for seizures like carbamazepine, mephobarbital, phenobarbital, phenytoin, primidone -medicines to increase blood counts like filgrastim, pegfilgrastim, sargramostim -prochlorperazine -vaccines This list may not describe all possible  interactions. Give your health care provider a list of all the medicines, herbs, non-prescription drugs, or dietary supplements you use. Also tell them if you smoke, drink alcohol, or use illegal drugs. Some items may interact with your medicine. What should I watch for while using this medicine? Your condition will be monitored carefully while you are receiving this medicine. You will need important blood work done while you are taking this medicine. This drug may make you feel generally unwell. This is not uncommon, as chemotherapy can affect healthy cells as well as cancer cells. Report any side effects. Continue your course of treatment even though you feel ill unless your doctor tells you to stop. In some cases, you may be given additional medicines to help with side effects. Follow all directions for their use. You may get drowsy or dizzy. Do not drive, use machinery, or do anything that needs mental alertness until you know how this medicine affects you. Do not stand or sit up quickly, especially if you are an older patient. This reduces the risk of dizzy or fainting spells. Call your doctor or health care professional for advice if you get a fever, chills or sore throat, or other symptoms of a cold or flu. Do not treat yourself. This drug decreases your body's ability to fight infections. Try to avoid being around people who are sick. This medicine may increase your risk to bruise or bleed. Call your doctor or health care professional if you notice any unusual bleeding. Be careful brushing and flossing your teeth or using a toothpick because you may get an infection or bleed more easily. If you have any dental work done, tell your dentist you are receiving this medicine. Avoid taking products that contain aspirin, acetaminophen, ibuprofen, naproxen, or ketoprofen unless instructed by your doctor. These medicines may hide a fever. Do not become pregnant while taking this medicine. Women should  inform their doctor if they wish to become pregnant or think they might be pregnant. There is a potential for serious side effects to an unborn child. Talk to your health care professional or pharmacist for more information. Do not breast-feed an infant while taking this medicine. What side effects may I notice from receiving this medicine? Side effects that you should report to your doctor or health care professional as soon as possible: -allergic reactions like skin rash, itching or hives, swelling of the face, lips, or tongue -low blood counts - this medicine may decrease the number of white blood cells, red blood cells and platelets. You may be at increased risk for infections and bleeding. -signs of infection - fever or chills, cough, sore throat, pain or difficulty passing urine -signs of decreased platelets or bleeding - bruising, pinpoint red spots on the skin, black, tarry stools, blood in the urine -signs of decreased red blood cells - unusually weak or tired, fainting spells, lightheadedness -breathing problems -chest pain -diarrhea -feeling faint or lightheaded, falls -flushing, runny nose, sweating during infusion -mouth sores or pain -pain, swelling, redness or irritation where injected -pain, swelling, warmth in the leg -pain, tingling, numbness in the hands or feet -problems with balance, talking, walking -stomach cramps, pain -trouble passing urine or change  in the amount of urine -vomiting as to be unable to hold down drinks or food -yellowing of the eyes or skin Side effects that usually do not require medical attention (report to your doctor or health care professional if they continue or are bothersome): -constipation -hair loss -headache -loss of appetite -nausea, vomiting -stomach upset This list may not describe all possible side effects. Call your doctor for medical advice about side effects. You may report side effects to FDA at 1-800-FDA-1088. Where should I  keep my medicine? This drug is given in a hospital or clinic and will not be stored at home. NOTE: This sheet is a summary. It may not cover all possible information. If you have questions about this medicine, talk to your doctor, pharmacist, or health care provider.  2018 [employer] Standard (2012-09-07 16:29:32) Oxaliplatin Injection What is this medicine? OXALIPLATIN (ox AL i PLA tin) is a chemotherapy drug. It targets fast dividing cells, like cancer cells, and causes these cells to die. This medicine is used to treat cancers of the colon and rectum, and many other cancers. This medicine may be used for other purposes; ask your health care provider or pharmacist if you have questions. COMMON BRAND NAME(S): Eloxatin What should I tell my health care provider before I take this medicine? They need to know if you have any of these conditions: -kidney disease -an unusual or allergic reaction to oxaliplatin, other chemotherapy, other medicines, foods, dyes, or preservatives -pregnant or trying to get pregnant -breast-feeding How should I use this medicine? This drug is given as an infusion into a vein. It is administered in a hospital or clinic by a specially trained health care professional. Talk to your pediatrician regarding the use of this medicine in children. Special care may be needed. Overdosage: If you think you have taken too much of this medicine contact a poison control center or emergency room at once. NOTE: This medicine is only for you. Do not share this medicine with others. What if I miss a dose? It is important not to miss a dose. Call your doctor or health care professional if you are unable to keep an appointment. What may interact with this medicine? -medicines to increase blood counts like filgrastim, pegfilgrastim, sargramostim -probenecid -some antibiotics like amikacin, gentamicin, neomycin, polymyxin B, streptomycin, tobramycin -zalcitabine Talk to your doctor  or health care professional before taking any of these medicines: -acetaminophen -aspirin -ibuprofen -ketoprofen -naproxen This list may not describe all possible interactions. Give your health care provider a list of all the medicines, herbs, non-prescription drugs, or dietary supplements you use. Also tell them if you smoke, drink alcohol, or use illegal drugs. Some items may interact with your medicine. What should I watch for while using this medicine? Your condition will be monitored carefully while you are receiving this medicine. You will need important blood work done while you are taking this medicine. This medicine can make you more sensitive to cold. Do not drink cold drinks or use ice. Cover exposed skin before coming in contact with cold temperatures or cold objects. When out in cold weather wear warm clothing and cover your mouth and nose to warm the air that goes into your lungs. Tell your doctor if you get sensitive to the cold. This drug may make you feel generally unwell. This is not uncommon, as chemotherapy can affect healthy cells as well as cancer cells. Report any side effects. Continue your course of treatment even though you feel ill unless your  doctor tells you to stop. In some cases, you may be given additional medicines to help with side effects. Follow all directions for their use. Call your doctor or health care professional for advice if you get a fever, chills or sore throat, or other symptoms of a cold or flu. Do not treat yourself. This drug decreases your body's ability to fight infections. Try to avoid being around people who are sick. This medicine may increase your risk to bruise or bleed. Call your doctor or health care professional if you notice any unusual bleeding. Be careful brushing and flossing your teeth or using a toothpick because you may get an infection or bleed more easily. If you have any dental work done, tell your dentist you are receiving this  medicine. Avoid taking products that contain aspirin, acetaminophen, ibuprofen, naproxen, or ketoprofen unless instructed by your doctor. These medicines may hide a fever. Do not become pregnant while taking this medicine. Women should inform their doctor if they wish to become pregnant or think they might be pregnant. There is a potential for serious side effects to an unborn child. Talk to your health care professional or pharmacist for more information. Do not breast-feed an infant while taking this medicine. Call your doctor or health care professional if you get diarrhea. Do not treat yourself. What side effects may I notice from receiving this medicine? Side effects that you should report to your doctor or health care professional as soon as possible: -allergic reactions like skin rash, itching or hives, swelling of the face, lips, or tongue -low blood counts - This drug may decrease the number of white blood cells, red blood cells and platelets. You may be at increased risk for infections and bleeding. -signs of infection - fever or chills, cough, sore throat, pain or difficulty passing urine -signs of decreased platelets or bleeding - bruising, pinpoint red spots on the skin, black, tarry stools, nosebleeds -signs of decreased red blood cells - unusually weak or tired, fainting spells, lightheadedness -breathing problems -chest pain, pressure -cough -diarrhea -jaw tightness -mouth sores -nausea and vomiting -pain, swelling, redness or irritation at the injection site -pain, tingling, numbness in the hands or feet -problems with balance, talking, walking -redness, blistering, peeling or loosening of the skin, including inside the mouth -trouble passing urine or change in the amount of urine Side effects that usually do not require medical attention (report to your doctor or health care professional if they continue or are bothersome): -changes in vision -constipation -hair  loss -loss of appetite -metallic taste in the mouth or changes in taste -stomach pain This list may not describe all possible side effects. Call your doctor for medical advice about side effects. You may report side effects to FDA at 1-800-FDA-1088. Where should I keep my medicine? This drug is given in a hospital or clinic and will not be stored at home. NOTE: This sheet is a summary. It may not cover all possible information. If you have questions about this medicine, talk to your doctor, pharmacist, or health care provider.  2018 [employer] Standard (2007-10-06 17:22:47)

## 2016-07-11 ENCOUNTER — Ambulatory Visit (HOSPITAL_BASED_OUTPATIENT_CLINIC_OR_DEPARTMENT_OTHER): Payer: BLUE CROSS/BLUE SHIELD

## 2016-07-11 DIAGNOSIS — Z452 Encounter for adjustment and management of vascular access device: Secondary | ICD-10-CM | POA: Diagnosis not present

## 2016-07-11 DIAGNOSIS — C187 Malignant neoplasm of sigmoid colon: Secondary | ICD-10-CM

## 2016-07-11 DIAGNOSIS — C772 Secondary and unspecified malignant neoplasm of intra-abdominal lymph nodes: Secondary | ICD-10-CM

## 2016-07-11 MED ORDER — HEPARIN SOD (PORK) LOCK FLUSH 100 UNIT/ML IV SOLN
500.0000 [IU] | Freq: Once | INTRAVENOUS | Status: AC | PRN
  Administered 2016-07-11: 500 [IU]
  Filled 2016-07-11: qty 5

## 2016-07-11 MED ORDER — SODIUM CHLORIDE 0.9% FLUSH
10.0000 mL | INTRAVENOUS | Status: DC | PRN
  Administered 2016-07-11: 10 mL
  Filled 2016-07-11: qty 10

## 2016-07-11 NOTE — Patient Instructions (Signed)
Implanted Port Insertion, Care After °This sheet gives you information about how to care for yourself after your procedure. Your health care provider may also give you more specific instructions. If you have problems or questions, contact your health care provider. °What can I expect after the procedure? °After your procedure, it is common to have: °· Discomfort at the port insertion site. °· Bruising on the skin over the port. This should improve over 3-4 days. ° °Follow these instructions at home: °Port care °· After your port is placed, you will get a manufacturer's information card. The card has information about your port. Keep this card with you at all times. °· Take care of the port as told by your health care provider. Ask your health care provider if you or a family member can get training for taking care of the port at home. A home health care nurse may also take care of the port. °· Make sure to remember what type of port you have. °Incision care °· Follow instructions from your health care provider about how to take care of your port insertion site. Make sure you: °? Wash your hands with soap and water before you change your bandage (dressing). If soap and water are not available, use hand sanitizer. °? Change your dressing as told by your health care provider. °? Leave stitches (sutures), skin glue, or adhesive strips in place. These skin closures may need to stay in place for 2 weeks or longer. If adhesive strip edges start to loosen and curl up, you may trim the loose edges. Do not remove adhesive strips completely unless your health care provider tells you to do that. °· Check your port insertion site every day for signs of infection. Check for: °? More redness, swelling, or pain. °? More fluid or blood. °? Warmth. °? Pus or a bad smell. °General instructions °· Do not take baths, swim, or use a hot tub until your health care provider approves. °· Do not lift anything that is heavier than 10 lb (4.5  kg) for a week, or as told by your health care provider. °· Ask your health care provider when it is okay to: °? Return to work or school. °? Resume usual physical activities or sports. °· Do not drive for 24 hours if you were given a medicine to help you relax (sedative). °· Take over-the-counter and prescription medicines only as told by your health care provider. °· Wear a medical alert bracelet in case of an emergency. This will tell any health care providers that you have a port. °· Keep all follow-up visits as told by your health care provider. This is important. °Contact a health care provider if: °· You cannot flush your port with saline as directed, or you cannot draw blood from the port. °· You have a fever or chills. °· You have more redness, swelling, or pain around your port insertion site. °· You have more fluid or blood coming from your port insertion site. °· Your port insertion site feels warm to the touch. °· You have pus or a bad smell coming from the port insertion site. °Get help right away if: °· You have chest pain or shortness of breath. °· You have bleeding from your port that you cannot control. °Summary °· Take care of the port as told by your health care provider. °· Change your dressing as told by your health care provider. °· Keep all follow-up visits as told by your health care provider. °  This information is not intended to replace advice given to you by your health care provider. Make sure you discuss any questions you have with your health care provider. °Document Released: 12/30/2012 Document Revised: 01/31/2016 Document Reviewed: 01/31/2016 °Elsevier Interactive Patient Education © 2017 [employer] ° °

## 2016-07-23 DIAGNOSIS — C187 Malignant neoplasm of sigmoid colon: Secondary | ICD-10-CM | POA: Diagnosis not present

## 2016-07-23 DIAGNOSIS — C189 Malignant neoplasm of colon, unspecified: Secondary | ICD-10-CM | POA: Diagnosis not present

## 2016-07-29 HISTORY — PX: OTHER SURGICAL HISTORY: SHX169

## 2016-07-30 ENCOUNTER — Ambulatory Visit: Payer: BLUE CROSS/BLUE SHIELD

## 2016-07-30 ENCOUNTER — Ambulatory Visit (HOSPITAL_BASED_OUTPATIENT_CLINIC_OR_DEPARTMENT_OTHER): Payer: BLUE CROSS/BLUE SHIELD | Admitting: Hematology & Oncology

## 2016-07-30 ENCOUNTER — Other Ambulatory Visit (HOSPITAL_BASED_OUTPATIENT_CLINIC_OR_DEPARTMENT_OTHER): Payer: BLUE CROSS/BLUE SHIELD

## 2016-07-30 VITALS — BP 120/79 | HR 77 | Temp 97.9°F | Resp 17

## 2016-07-30 DIAGNOSIS — C187 Malignant neoplasm of sigmoid colon: Secondary | ICD-10-CM

## 2016-07-30 DIAGNOSIS — C189 Malignant neoplasm of colon, unspecified: Secondary | ICD-10-CM

## 2016-07-30 DIAGNOSIS — C772 Secondary and unspecified malignant neoplasm of intra-abdominal lymph nodes: Principal | ICD-10-CM

## 2016-07-30 LAB — COMPREHENSIVE METABOLIC PANEL
ALT: 22 U/L (ref 0–55)
ANION GAP: 9 meq/L (ref 3–11)
AST: 22 U/L (ref 5–34)
Albumin: 4 g/dL (ref 3.5–5.0)
Alkaline Phosphatase: 72 U/L (ref 40–150)
BILIRUBIN TOTAL: 0.54 mg/dL (ref 0.20–1.20)
BUN: 12.1 mg/dL (ref 7.0–26.0)
CO2: 26 meq/L (ref 22–29)
CREATININE: 0.7 mg/dL (ref 0.6–1.1)
Calcium: 9.5 mg/dL (ref 8.4–10.4)
Chloride: 107 mEq/L (ref 98–109)
EGFR: >90 mL/min/{1.73_m2} (ref >90)
GLUCOSE: 80 mg/dL (ref 70–140)
Potassium: 4.2 mEq/L (ref 3.5–5.1)
SODIUM: 141 meq/L (ref 136–145)
TOTAL PROTEIN: 6.8 g/dL (ref 6.4–8.3)

## 2016-07-30 LAB — CBC WITH DIFFERENTIAL (CANCER CENTER ONLY)
BASO#: 0 10*3/uL (ref 0.0–0.2)
BASO%: 0.8 % (ref 0.0–2.0)
EOS%: 2.8 % (ref 0.0–7.0)
Eosinophils Absolute: 0.1 10*3/uL (ref 0.0–0.5)
HEMATOCRIT: 38.1 % (ref 34.8–46.6)
HGB: 13 g/dL (ref 11.6–15.9)
LYMPH#: 1.4 10*3/uL (ref 0.9–3.3)
LYMPH%: 36.7 % (ref 14.0–48.0)
MCH: 30.3 pg (ref 26.0–34.0)
MCHC: 34.1 g/dL (ref 32.0–36.0)
MCV: 89 fL (ref 81–101)
MONO#: 0.5 10*3/uL (ref 0.1–0.9)
MONO%: 13.8 % — ABNORMAL HIGH (ref 0.0–13.0)
NEUT#: 1.8 10*3/uL (ref 1.5–6.5)
NEUT%: 45.9 % (ref 39.6–80.0)
PLATELETS: 234 10*3/uL (ref 145–400)
RBC: 4.29 10*6/uL (ref 3.70–5.32)
RDW: 14.9 % (ref 11.1–15.7)
WBC: 3.9 10*3/uL (ref 3.9–10.0)

## 2016-07-30 LAB — LACTATE DEHYDROGENASE: LDH: 186 U/L (ref 125–245)

## 2016-07-30 LAB — CEA (IN HOUSE-CHCC): CEA (CHCC-IN HOUSE): 2.17 ng/mL (ref 0.00–5.00)

## 2016-07-30 NOTE — Progress Notes (Signed)
Hematology and Oncology Follow Up Visit  Carla Mills [id number] May 01, 1970 46 y.o. 07/30/2016   Principle Diagnosis:   Recurrent colon cancer - HER2 (-)/MSI stable/MMR normal/BRAF -  Current Therapy:     FOLFOXIRI on 04/09/2016 - s/p cycle #7     Interim History:  Carla Mills is back for follow-up. She has done very well. She really has had very few side effects of therapy.  This will be her last cycle of chemotherapy prior to her surgery. She will go to surgery at [facility] on May 17.  She is feeling well. 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 This is not stop her from doing what she likes to do. Patient walked quite a bit last week with the nice weather. She's had no problems with bowels or bladder. She's had no bleeding. He's had no bowel source patient had no cough or shortness of breath.  She's had no fever. She's had no rashes.  Overall, she has handled her chemotherapy incredibly well. She is responded quite nicely by her last PET scan. She recently had a CT scan at [facility]. She says of this showed very little in the way of disease. Her last CEA was 2.2. This is holding stable.  Overall, her performance status is ECOG 0.  Medications:  Current Outpatient Prescriptions:  .  erythromycin base (E-MYCIN) 500 MG tablet, Take two tablets at 1:00pm, 2:00pm and 11:00pm the night before surgery., Disp: , Rfl:  .  neomycin (MYCIFRADIN) 500 MG tablet, Take two tablets at 1:00pm, 2:00pm and 11:00pm the night before surgery., Disp: , Rfl:  .  acetaminophen (TYLENOL) 500 MG tablet, Take 1,000 mg by mouth every 8 (eight) hours as needed for moderate pain. , Disp: , Rfl:  .  dexamethasone (DECADRON) 4 MG tablet, Take 2 tablets (8 mg total) by mouth daily. Start the day after chemo and take for 2 days., Disp: 24 tablet, Rfl: 0 .  fexofenadine (ALLEGRA) 180 MG tablet, Take 180 mg by mouth daily as needed for allergies. , Disp: , Rfl:  .  fluticasone (FLONASE) 50 MCG/ACT nasal  spray, Place 1 spray into both nostrils 2 (two) times daily. , Disp: , Rfl:  .  HYDROcodone-acetaminophen (NORCO/VICODIN) 5-325 MG tablet, Take 1-2 tablets by mouth every 6 (six) hours as needed., Disp: 15 tablet, Rfl: 0 .  ibuprofen (ADVIL,MOTRIN) 200 MG tablet, Take 400 mg by mouth every 6 (six) hours as needed for headache or mild pain., Disp: , Rfl:  .  lidocaine-prilocaine (EMLA) cream, Apply 1 application topically as needed. Apply to [facility] 1 hour prior to treatment., Disp: 30 g, Rfl: 3 .  loperamide (IMODIUM) 2 MG capsule, Take 1 capsule (2 mg total) by mouth as needed for diarrhea or loose stools. Take 2 capsules at the onset of diarrhea, then 1 every 2 hours until 12 hours with no BM. May take 2 every 4 hours at night. If diarrhea recurrs, repeat., Disp: 100 capsule, Rfl: 0 .  LORazepam (ATIVAN) 1 MG tablet, Take 1 tablet (1 mg total) by mouth every 6 (six) hours as needed for anxiety. Prn nausea/vomiting, Disp: 30 tablet, Rfl: 0 .  magic mouthwash w/lidocaine SOLN, Take 5 mLs by mouth 3 (three) times daily as needed for mouth pain., Disp: 120 mL, Rfl: 1 .  nystatin (MYCOSTATIN) 100000 UNIT/ML suspension, 100,000 Units., Disp: , Rfl: 0 .  ondansetron (ZOFRAN) 8 MG tablet, Take 1 tablet (8 mg total) by mouth every 8 (eight) hours as needed for nausea  or vomiting. Start Day 3 after chemo., Disp: 20 tablet, Rfl: 0 .  prochlorperazine (COMPAZINE) 10 MG tablet, Take 1 tablet (10 mg total) by mouth every 6 (six) hours as needed for nausea or vomiting., Disp: 30 tablet, Rfl: 1 .  promethazine (PHENERGAN) 25 MG tablet, Take 1 tablet (25 mg total) by mouth every 6 (six) hours as needed for nausea or vomiting., Disp: 12 tablet, Rfl: 0 .  traMADol (ULTRAM) 50 MG tablet, Take 1 tablet (50 mg total) by mouth every 6 (six) hours as needed. (Patient taking differently: Take 50 mg by mouth every 6 (six) hours as needed for moderate pain. ), Disp: 90 tablet, Rfl: 0 No current facility-administered medications for  this visit.   Facility-Administered Medications Ordered in Other Visits:  .  clindamycin (CLEOCIN) 900 mg in dextrose 5 % 50 mL IVPB, 900 mg, Intravenous, 60 min Pre-Op **AND** gentamicin (GARAMYCIN) 340 mg in dextrose 5 % 50 mL IVPB, 5 mg/kg, Intravenous, 60 min Pre-Op, [name], MD .  dextrose 5 % solution, , Intravenous, Continuous, Ennever, Rudell Cobb, MD, Stopped at 04/09/16 1418 .  heparin lock flush 100 unit/mL, 500 Units, Intracatheter, Once PRN, Volanda Napoleon, MD .  sodium chloride flush (NS) 0.9 % injection 10 mL, 10 mL, Intracatheter, PRN, Volanda Napoleon, MD  Allergies:  Allergies  Allergen Reactions  . Bactrim [Sulfamethoxazole-Trimethoprim]   . Oxaprozin Hives    REACTION: Hives  . Penicillins Hives    REACTION: Hives Has patient had a PCN reaction causing immediate rash, facial/tongue/throat swelling, SOB or lightheadedness with hypotension: no Has patient had a PCN reaction causing severe rash involving mucus membranes or skin necrosis: {no Has patient had a PCN reaction that required hospitalization no Has patient had a PCN reaction occurring within the last 10 years: no If all of the above answers are "NO", then may proceed with Cephalosporin use.  . Sulfonamide Derivatives Swelling    Massive extremity swelling     Past Medical History, Surgical history, Social history, and Family History were reviewed and updated.  Review of Systems:  As above  Physical Exam:  vitals were not taken for this visit.  Wt Readings from Last 3 Encounters:  07/09/16 161 lb 1.3 oz (73.1 kg)  06/25/16 150 lb (68 kg)  06/11/16 154 lb 1.9 oz (69.9 kg)      Well-developed and well-nourished white female in no obvious distress. Head and neck exam shows no ocular or oral lesions. She has no palpable cervical or supraclavicular lymph nodes. Lungs are clear. Cardiac exam regular rate and rhythm with no murmurs, rubs or bruits. Abdomen is soft. She has well-healed laparotomy scar.  She has no fluid wave. There is no palpable liver or spleen tip. There is no obvious abdominal mass. She has no palpable inguinal lymph nodes. Back exam shows no tenderness over the spine, ribs or hips. Extremities shows no clubbing, cyanosis or edema. She's good range motion of her joints. Skin exam shows no rashes, ecchymoses or petechia. Neurological exam shows no focal neurological deficits.  Lab Results  Component Value Date   WBC 5.6 07/09/2016   HGB 12.7 07/09/2016   HCT 37.1 07/09/2016   MCV 88 07/09/2016   PLT 203 07/09/2016     Chemistry      Component Value Date/Time   NA 138 07/09/2016 0758   NA 140 05/28/2016 0813   K 3.9 07/09/2016 0758   K 3.8 05/28/2016 0813   CL 104 07/09/2016 0758  CO2 27 07/09/2016 0758   CO2 23 05/28/2016 0813   BUN 10 07/09/2016 0758   BUN 12.2 05/28/2016 0813   CREATININE 0.8 07/09/2016 0758   CREATININE 0.7 05/28/2016 0813      Component Value Date/Time   CALCIUM 8.9 07/09/2016 0758   CALCIUM 8.9 05/28/2016 0813   ALKPHOS 70 07/09/2016 0758   ALKPHOS 72 05/28/2016 0813   AST 39 (H) 07/09/2016 0758   AST 19 05/28/2016 0813   ALT 43 07/09/2016 0758   ALT 34 05/28/2016 0813   BILITOT 0.70 07/09/2016 0758   BILITOT 0.22 05/28/2016 0813         Impression and Plan: Carla Mills is a 46 year old white female. She initially presented with stage II colon cancer back in I think December 2016. She had a very high Oncotype score of 37. Because of this, we went ahead and gave her adjuvant chemotherapy with Xeloda. Despite this, her cancer has recurred.  I feel that she will do quite well with the HIPEC procedure. I know that this is an incredibly aggressive procedure. However, she is young, in great shape, and has a very good performance status. Hopefully, she will not be left with an ostomy. If she is left with one, then hopefully this will be temporary.  We will plan to see her back After her surgery. Hopefully, she will not need to have a  ileostomy.   Volanda Napoleon, MD 5/8/20188:40 AM

## 2016-07-30 NOTE — Patient Instructions (Signed)
Implanted Port Home Guide An implanted port is a type of central line that is placed under the skin. Central lines are used to provide IV access when treatment or nutrition needs to be given through a person's veins. Implanted ports are used for long-term IV access. An implanted port may be placed because:  You need IV medicine that would be irritating to the small veins in your hands or arms.  You need long-term IV medicines, such as antibiotics.  You need IV nutrition for a long period.  You need frequent blood draws for lab tests.  You need dialysis.  Implanted ports are usually placed in the chest area, but they can also be placed in the upper arm, the abdomen, or the leg. An implanted port has two main parts:  Reservoir. The reservoir is round and will appear as a small, raised area under your skin. The reservoir is the part where a needle is inserted to give medicines or draw blood.  Catheter. The catheter is a thin, flexible tube that extends from the reservoir. The catheter is placed into a large vein. Medicine that is inserted into the reservoir goes into the catheter and then into the vein.  How will I care for my incision site? Do not get the incision site wet. Bathe or shower as directed by your health care provider. How is my port accessed? Special steps must be taken to access the port:  Before the port is accessed, a numbing cream can be placed on the skin. This helps numb the skin over the port site.  Your health care provider uses a sterile technique to access the port. ? Your health care provider must put on a mask and sterile gloves. ? The skin over your port is cleaned carefully with an antiseptic and allowed to dry. ? The port is gently pinched between sterile gloves, and a needle is inserted into the port.  Only "non-coring" port needles should be used to access the port. Once the port is accessed, a blood return should be checked. This helps ensure that the port  is in the vein and is not clogged.  If your port needs to remain accessed for a constant infusion, a clear (transparent) bandage will be placed over the needle site. The bandage and needle will need to be changed every week, or as directed by your health care provider.  Keep the bandage covering the needle clean and dry. Do not get it wet. Follow your health care provider's instructions on how to take a shower or bath while the port is accessed.  If your port does not need to stay accessed, no bandage is needed over the port.  What is flushing? Flushing helps keep the port from getting clogged. Follow your health care provider's instructions on how and when to flush the port. Ports are usually flushed with saline solution or a medicine called heparin. The need for flushing will depend on how the port is used.  If the port is used for intermittent medicines or blood draws, the port will need to be flushed: ? After medicines have been given. ? After blood has been drawn. ? As part of routine maintenance.  If a constant infusion is running, the port may not need to be flushed.  How long will my port stay implanted? The port can stay in for as long as your health care provider thinks it is needed. When it is time for the port to come out, surgery will be   done to remove it. The procedure is similar to the one performed when the port was put in. When should I seek immediate medical care? When you have an implanted port, you should seek immediate medical care if:  You notice a bad smell coming from the incision site.  You have swelling, redness, or drainage at the incision site.  You have more swelling or pain at the port site or the surrounding area.  You have a fever that is not controlled with medicine.  This information is not intended to replace advice given to you by your health care provider. Make sure you discuss any questions you have with your health care provider. Document  Released: 03/11/2005 Document Revised: 08/17/2015 Document Reviewed: 11/16/2012 Elsevier Interactive Patient Education  2017 [employer]  

## 2016-07-31 MED FILL — ERYTHROMYCIN 500 MG FILMTAB: 500 | 1 days supply | Qty: 6 | Fill #0

## 2016-07-31 MED FILL — NEOMYCIN 500 MG TABLET: 500 | 1 days supply | Qty: 6 | Fill #0

## 2016-08-08 DIAGNOSIS — Z7289 Other problems related to lifestyle: Secondary | ICD-10-CM | POA: Diagnosis not present

## 2016-08-08 DIAGNOSIS — C786 Secondary malignant neoplasm of retroperitoneum and peritoneum: Secondary | ICD-10-CM | POA: Diagnosis not present

## 2016-08-08 DIAGNOSIS — I952 Hypotension due to drugs: Secondary | ICD-10-CM | POA: Diagnosis not present

## 2016-08-08 DIAGNOSIS — M7989 Other specified soft tissue disorders: Secondary | ICD-10-CM | POA: Diagnosis not present

## 2016-08-08 DIAGNOSIS — Z888 Allergy status to other drugs, medicaments and biological substances status: Secondary | ICD-10-CM | POA: Diagnosis not present

## 2016-08-08 DIAGNOSIS — C7961 Secondary malignant neoplasm of right ovary: Secondary | ICD-10-CM | POA: Diagnosis not present

## 2016-08-08 DIAGNOSIS — Z85038 Personal history of other malignant neoplasm of large intestine: Secondary | ICD-10-CM | POA: Diagnosis not present

## 2016-08-08 DIAGNOSIS — Z9221 Personal history of antineoplastic chemotherapy: Secondary | ICD-10-CM | POA: Diagnosis not present

## 2016-08-08 DIAGNOSIS — Z880 Allergy status to penicillin: Secondary | ICD-10-CM | POA: Diagnosis not present

## 2016-08-08 DIAGNOSIS — C19 Malignant neoplasm of rectosigmoid junction: Secondary | ICD-10-CM | POA: Diagnosis not present

## 2016-08-08 DIAGNOSIS — T50905A Adverse effect of unspecified drugs, medicaments and biological substances, initial encounter: Secondary | ICD-10-CM | POA: Diagnosis not present

## 2016-08-08 DIAGNOSIS — G600 Hereditary motor and sensory neuropathy: Secondary | ICD-10-CM | POA: Diagnosis not present

## 2016-08-08 DIAGNOSIS — G8918 Other acute postprocedural pain: Secondary | ICD-10-CM | POA: Diagnosis not present

## 2016-08-08 DIAGNOSIS — R110 Nausea: Secondary | ICD-10-CM | POA: Diagnosis not present

## 2016-08-08 DIAGNOSIS — J302 Other seasonal allergic rhinitis: Secondary | ICD-10-CM | POA: Diagnosis not present

## 2016-08-08 DIAGNOSIS — Z466 Encounter for fitting and adjustment of urinary device: Secondary | ICD-10-CM | POA: Diagnosis not present

## 2016-08-08 DIAGNOSIS — C7962 Secondary malignant neoplasm of left ovary: Secondary | ICD-10-CM | POA: Diagnosis not present

## 2016-08-08 DIAGNOSIS — Z79899 Other long term (current) drug therapy: Secondary | ICD-10-CM | POA: Diagnosis not present

## 2016-08-08 DIAGNOSIS — Z95828 Presence of other vascular implants and grafts: Secondary | ICD-10-CM | POA: Diagnosis not present

## 2016-08-08 DIAGNOSIS — C569 Malignant neoplasm of unspecified ovary: Secondary | ICD-10-CM | POA: Diagnosis not present

## 2016-08-08 DIAGNOSIS — C189 Malignant neoplasm of colon, unspecified: Secondary | ICD-10-CM | POA: Diagnosis not present

## 2016-08-08 DIAGNOSIS — R109 Unspecified abdominal pain: Secondary | ICD-10-CM | POA: Diagnosis not present

## 2016-08-09 DIAGNOSIS — G8918 Other acute postprocedural pain: Secondary | ICD-10-CM | POA: Diagnosis not present

## 2016-08-09 DIAGNOSIS — C189 Malignant neoplasm of colon, unspecified: Secondary | ICD-10-CM | POA: Diagnosis not present

## 2016-08-09 DIAGNOSIS — R109 Unspecified abdominal pain: Secondary | ICD-10-CM | POA: Diagnosis not present

## 2016-08-10 DIAGNOSIS — G8918 Other acute postprocedural pain: Secondary | ICD-10-CM | POA: Diagnosis not present

## 2016-08-10 DIAGNOSIS — C189 Malignant neoplasm of colon, unspecified: Secondary | ICD-10-CM | POA: Diagnosis not present

## 2016-08-10 DIAGNOSIS — R109 Unspecified abdominal pain: Secondary | ICD-10-CM | POA: Diagnosis not present

## 2016-08-11 DIAGNOSIS — C189 Malignant neoplasm of colon, unspecified: Secondary | ICD-10-CM | POA: Diagnosis not present

## 2016-08-11 DIAGNOSIS — R109 Unspecified abdominal pain: Secondary | ICD-10-CM | POA: Diagnosis not present

## 2016-08-11 DIAGNOSIS — G8918 Other acute postprocedural pain: Secondary | ICD-10-CM | POA: Diagnosis not present

## 2016-08-12 DIAGNOSIS — R109 Unspecified abdominal pain: Secondary | ICD-10-CM | POA: Diagnosis not present

## 2016-08-12 DIAGNOSIS — G8918 Other acute postprocedural pain: Secondary | ICD-10-CM | POA: Diagnosis not present

## 2016-08-12 DIAGNOSIS — C189 Malignant neoplasm of colon, unspecified: Secondary | ICD-10-CM | POA: Diagnosis not present

## 2016-08-13 ENCOUNTER — Other Ambulatory Visit: Payer: Self-pay | Admitting: Hematology & Oncology

## 2016-08-13 DIAGNOSIS — C772 Secondary and unspecified malignant neoplasm of intra-abdominal lymph nodes: Principal | ICD-10-CM

## 2016-08-13 DIAGNOSIS — C189 Malignant neoplasm of colon, unspecified: Secondary | ICD-10-CM

## 2016-08-13 MED ORDER — PEGFILGRASTIM INJECTION 6 MG/0.6ML ~~LOC~~: 6.0000 mg | PREFILLED_SYRINGE | Freq: Once | SUBCUTANEOUS | Status: DC

## 2016-08-13 MED FILL — ENOXAPARIN 40 MG/0.4 ML SYR: 40 | 14 days supply | Qty: 6 | Fill #0

## 2016-08-16 ENCOUNTER — Ambulatory Visit (HOSPITAL_BASED_OUTPATIENT_CLINIC_OR_DEPARTMENT_OTHER): Payer: BLUE CROSS/BLUE SHIELD

## 2016-08-16 ENCOUNTER — Other Ambulatory Visit (HOSPITAL_BASED_OUTPATIENT_CLINIC_OR_DEPARTMENT_OTHER): Payer: BLUE CROSS/BLUE SHIELD

## 2016-08-16 ENCOUNTER — Ambulatory Visit: Payer: BLUE CROSS/BLUE SHIELD

## 2016-08-16 ENCOUNTER — Ambulatory Visit (HOSPITAL_BASED_OUTPATIENT_CLINIC_OR_DEPARTMENT_OTHER): Payer: BLUE CROSS/BLUE SHIELD | Admitting: Hematology & Oncology

## 2016-08-16 VITALS — BP 104/70 | HR 76 | Temp 97.8°F | Resp 18 | Wt 162.0 lb

## 2016-08-16 DIAGNOSIS — C772 Secondary and unspecified malignant neoplasm of intra-abdominal lymph nodes: Principal | ICD-10-CM

## 2016-08-16 DIAGNOSIS — C187 Malignant neoplasm of sigmoid colon: Secondary | ICD-10-CM

## 2016-08-16 DIAGNOSIS — C189 Malignant neoplasm of colon, unspecified: Secondary | ICD-10-CM | POA: Diagnosis not present

## 2016-08-16 DIAGNOSIS — R7989 Other specified abnormal findings of blood chemistry: Secondary | ICD-10-CM

## 2016-08-16 LAB — CBC WITH DIFFERENTIAL (CANCER CENTER ONLY)
BASO#: 0 10*3/uL (ref 0.0–0.2)
BASO%: 0.2 % (ref 0.0–2.0)
EOS%: 1.3 % (ref 0.0–7.0)
Eosinophils Absolute: 0.1 10*3/uL (ref 0.0–0.5)
HEMATOCRIT: 31.9 % — AB (ref 34.8–46.6)
HEMOGLOBIN: 10.9 g/dL — AB (ref 11.6–15.9)
LYMPH#: 1 10*3/uL (ref 0.9–3.3)
LYMPH%: 18.1 % (ref 14.0–48.0)
MCH: 31.1 pg (ref 26.0–34.0)
MCHC: 34.2 g/dL (ref 32.0–36.0)
MCV: 91 fL (ref 81–101)
MONO#: 0.5 10*3/uL (ref 0.1–0.9)
MONO%: 8.2 % (ref 0.0–13.0)
NEUT%: 72.2 % (ref 39.6–80.0)
NEUTROS ABS: 4 10*3/uL (ref 1.5–6.5)
Platelets: 256 10*3/uL (ref 145–400)
RBC: 3.51 10*6/uL — ABNORMAL LOW (ref 3.70–5.32)
RDW: 14 % (ref 11.1–15.7)
WBC: 5.6 10*3/uL (ref 3.9–10.0)

## 2016-08-16 LAB — CMP (CANCER CENTER ONLY)
ALT(SGPT): 101 U/L — ABNORMAL HIGH (ref 10–47)
AST: 109 U/L — ABNORMAL HIGH (ref 11–38)
Albumin: 3.2 g/dL — ABNORMAL LOW (ref 3.3–5.5)
Alkaline Phosphatase: 202 U/L — ABNORMAL HIGH (ref 26–84)
BUN, Bld: 9 mg/dL (ref 7–22)
CO2: 26 mEq/L (ref 18–33)
Calcium: 8.7 mg/dL (ref 8.0–10.3)
Chloride: 106 mEq/L (ref 98–108)
Creat: 0.7 mg/dl (ref 0.6–1.2)
Glucose, Bld: 97 mg/dL (ref 73–118)
Potassium: 3.7 mEq/L (ref 3.3–4.7)
Sodium: 142 mEq/L (ref 128–145)
Total Bilirubin: 0.5 mg/dl (ref 0.20–1.60)
Total Protein: 6.5 g/dL (ref 6.4–8.1)

## 2016-08-16 LAB — CEA (IN HOUSE-CHCC): CEA (CHCC-In House): 1.56 ng/mL (ref 0.00–5.00)

## 2016-08-16 MED ORDER — SODIUM CHLORIDE 0.9% FLUSH
10.0000 mL | INTRAVENOUS | Status: DC | PRN
  Administered 2016-08-16: 10 mL via INTRAVENOUS
  Filled 2016-08-16: qty 10

## 2016-08-16 MED ORDER — HEPARIN SOD (PORK) LOCK FLUSH 100 UNIT/ML IV SOLN
500.0000 [IU] | Freq: Once | INTRAVENOUS | Status: AC
  Administered 2016-08-16: 500 [IU] via INTRAVENOUS
  Filled 2016-08-16: qty 5

## 2016-08-16 NOTE — Progress Notes (Signed)
Hematology and Oncology Follow Up Visit  Noelani Harbach [id number] [birth year] 46 y.o. 08/16/2016   Principle Diagnosis:   Recurrent colon cancer - HER2 (-)Zachery Dakins stable/MMR normal/BRAF -  Current Therapy:     FOLFOXIRI on 04/09/2016 - s/p cycle #7  Status post exploratory laparotomy with HIPEC - 07/29/2016     Interim History:  Ms. Mccleese is back for follow-up. I am absolutely amazed as to how well she looks. She underwent an incredibly aggressive and extensive laparotomy for HIPEC. This was done at [facility], she did not require a colostomy area and she did have to have her ovaries removed. The pathology report [id number]) showed only microscopic foci of disease in her ovaries. There is no evidence of malignancy elsewhere. A lot of lymph nodes were resected. I believe that the surgeon was very happy with how well things went.  She was only hospitalized for one week.  She does have some discomfort. She has had no nausea or vomiting. She's had no problem with bowels or bladder. There is no diarrhea.  She's had no cough or shortness of breath.  Of note, she is on Lovenox injections for 2 weeks. This is for DVT prophylaxis.  Her weight actually is doing quite well.  There's been no bleeding.   Overall, her performance status is ECOG 0.  Medications:  Current Outpatient Prescriptions:  .  enoxaparin (LOVENOX) 40 MG/0.4ML injection, Inject 40 mg into the skin., Disp: , Rfl:  .  oxyCODONE (OXY IR/ROXICODONE) 5 MG immediate release tablet, Take 5 mg by mouth., Disp: , Rfl:  .  acetaminophen (TYLENOL) 500 MG tablet, Take 1,000 mg by mouth every 8 (eight) hours as needed for moderate pain. , Disp: , Rfl:  .  dexamethasone (DECADRON) 4 MG tablet, Take 2 tablets (8 mg total) by mouth daily. Start the day after chemo and take for 2 days., Disp: 24 tablet, Rfl: 0 .  fexofenadine (ALLEGRA) 180 MG tablet, Take 180 mg by mouth daily as needed for allergies. , Disp: , Rfl:  .   fluticasone (FLONASE) 50 MCG/ACT nasal spray, Place 1 spray into both nostrils 2 (two) times daily. , Disp: , Rfl:  .  HYDROcodone-acetaminophen (NORCO/VICODIN) 5-325 MG tablet, Take 1-2 tablets by mouth every 6 (six) hours as needed., Disp: 15 tablet, Rfl: 0 .  ibuprofen (ADVIL,MOTRIN) 200 MG tablet, Take 400 mg by mouth every 6 (six) hours as needed for headache or mild pain., Disp: , Rfl:  .  lidocaine-prilocaine (EMLA) cream, Apply 1 application topically as needed. Apply to [facility] 1 hour prior to treatment., Disp: 30 g, Rfl: 3 .  loperamide (IMODIUM) 2 MG capsule, Take 1 capsule (2 mg total) by mouth as needed for diarrhea or loose stools. Take 2 capsules at the onset of diarrhea, then 1 every 2 hours until 12 hours with no BM. May take 2 every 4 hours at night. If diarrhea recurrs, repeat., Disp: 100 capsule, Rfl: 0 .  LORazepam (ATIVAN) 1 MG tablet, Take 1 tablet (1 mg total) by mouth every 6 (six) hours as needed for anxiety. Prn nausea/vomiting, Disp: 30 tablet, Rfl: 0 .  magic mouthwash w/lidocaine SOLN, Take 5 mLs by mouth 3 (three) times daily as needed for mouth pain., Disp: 120 mL, Rfl: 1 .  neomycin (MYCIFRADIN) 500 MG tablet, Take two tablets at 1:00pm, 2:00pm and 11:00pm the night before surgery., Disp: , Rfl:  .  nystatin (MYCOSTATIN) 100000 UNIT/ML suspension, 100,000 Units., Disp: , Rfl: 0 .  ondansetron (ZOFRAN)  8 MG tablet, Take 1 tablet (8 mg total) by mouth every 8 (eight) hours as needed for nausea or vomiting. Start Day 3 after chemo., Disp: 20 tablet, Rfl: 0 .  prochlorperazine (COMPAZINE) 10 MG tablet, Take 1 tablet (10 mg total) by mouth every 6 (six) hours as needed for nausea or vomiting., Disp: 30 tablet, Rfl: 1 .  promethazine (PHENERGAN) 25 MG tablet, Take 1 tablet (25 mg total) by mouth every 6 (six) hours as needed for nausea or vomiting., Disp: 12 tablet, Rfl: 0 .  traMADol (ULTRAM) 50 MG tablet, Take 1 tablet (50 mg total) by mouth every 6 (six) hours as needed.  (Patient taking differently: Take 50 mg by mouth every 6 (six) hours as needed for moderate pain. ), Disp: 90 tablet, Rfl: 0 No current facility-administered medications for this visit.   Facility-Administered Medications Ordered in Other Visits:  .  clindamycin (CLEOCIN) 900 mg in dextrose 5 % 50 mL IVPB, 900 mg, Intravenous, 60 min Pre-Op **AND** gentamicin (GARAMYCIN) 340 mg in dextrose 5 % 50 mL IVPB, 5 mg/kg, Intravenous, 60 min Pre-Op, [name], MD .  dextrose 5 % solution, , Intravenous, Continuous, Alphonse Asbridge, Rudell Cobb, MD, Stopped at 04/09/16 1418 .  heparin lock flush 100 unit/mL, 500 Units, Intracatheter, Once PRN, Volanda Napoleon, MD .  sodium chloride flush (NS) 0.9 % injection 10 mL, 10 mL, Intracatheter, PRN, Volanda Napoleon, MD .  sodium chloride flush (NS) 0.9 % injection 10 mL, 10 mL, Intravenous, PRN, Volanda Napoleon, MD, 10 mL at 08/16/16 0910  Allergies:  Allergies  Allergen Reactions  . Bactrim [Sulfamethoxazole-Trimethoprim]   . Oxaprozin Hives    REACTION: Hives  . Penicillins Hives    REACTION: Hives Has patient had a PCN reaction causing immediate rash, facial/tongue/throat swelling, SOB or lightheadedness with hypotension: no Has patient had a PCN reaction causing severe rash involving mucus membranes or skin necrosis: {no Has patient had a PCN reaction that required hospitalization no Has patient had a PCN reaction occurring within the last 10 years: no If all of the above answers are "NO", then may proceed with Cephalosporin use.  . Sulfonamide Derivatives Swelling    Massive extremity swelling     Past Medical History, Surgical history, Social history, and Family History were reviewed and updated.  Review of Systems:  As above  Physical Exam:  weight is 162 lb 0.6 oz (73.5 kg). Her oral temperature is 97.8 F (36.6 C). Her blood pressure is 104/70 and her pulse is 76. Her respiration is 18 and oxygen saturation is 100%.   Wt Readings from Last  3 Encounters:  08/16/16 162 lb 0.6 oz (73.5 kg)  07/09/16 161 lb 1.3 oz (73.1 kg)  06/25/16 150 lb (68 kg)      Well-developed and well-nourished white female in no obvious distress. Head and neck exam shows no ocular or oral lesions. She has no palpable cervical or supraclavicular lymph nodes. Lungs are clear. Cardiac exam regular rate and rhythm with no murmurs, rubs or bruits. Abdomen is soft. She has a dressing over the laparotomy wound. There is no fluid wave. She has no guarding or rebound tenderness. She has no liver or spleen tip. Her laparoscopy scars are healing.  Back exam shows no tenderness over the spine, ribs or hips. Extremities shows no clubbing, cyanosis or edema. She's good range motion of her joints. Skin exam shows no rashes, ecchymoses or petechia. Neurological exam shows no focal neurological deficits.  Lab  Results  Component Value Date   WBC 5.6 08/16/2016   HGB 10.9 (L) 08/16/2016   HCT 31.9 (L) 08/16/2016   MCV 91 08/16/2016   PLT 256 08/16/2016     Chemistry      Component Value Date/Time   NA 142 08/16/2016 0833   NA 141 07/30/2016 0750   K 3.7 08/16/2016 0833   K 4.2 07/30/2016 0750   CL 106 08/16/2016 0833   CO2 26 08/16/2016 0833   CO2 26 07/30/2016 0750   BUN 9 08/16/2016 0833   BUN 12.1 07/30/2016 0750   CREATININE 0.7 08/16/2016 0833   CREATININE 0.7 07/30/2016 0750      Component Value Date/Time   CALCIUM 8.7 08/16/2016 0833   CALCIUM 9.5 07/30/2016 0750   ALKPHOS 202 (H) 08/16/2016 0833   ALKPHOS 72 07/30/2016 0750   AST 109 (H) 08/16/2016 0833   AST 22 07/30/2016 0750   ALT 101 (H) 08/16/2016 0833   ALT 22 07/30/2016 0750   BILITOT 0.50 08/16/2016 0833   BILITOT 0.54 07/30/2016 0750         Impression and Plan: Ms. Napoleon is a 46 year old white female. She initially presented with stage II colon cancer back in I think December 2016. She had a very high Oncotype score of 37. Because of this, we went ahead and gave her adjuvant  chemotherapy with Xeloda. Despite this, her cancer has recurred.  I am incredibly pleased that she is done so well. Her response to chemotherapy has been fantastic.  We will need to continue her on systemic chemotherapy. I think she has 5 more treatments to go. Again she is responded very nicely. She did have some residual disease that was resected. I will like to think that the intraperitoneal chemotherapy has allowed for resolution of any microscopic disease.  Her liver function tests are somewhat elevated. I need to make sure she comes back next week so we can follow-up with this.  I will like to see her back in 2 weeks.  She goes back to see Dr.Shen on June 19.  Volanda Napoleon, MD 5/25/201811:35 AM

## 2016-08-16 NOTE — Patient Instructions (Signed)
Implanted Port Insertion, Care After °This sheet gives you information about how to care for yourself after your procedure. Your health care provider may also give you more specific instructions. If you have problems or questions, contact your health care provider. °What can I expect after the procedure? °After your procedure, it is common to have: °· Discomfort at the port insertion site. °· Bruising on the skin over the port. This should improve over 3-4 days. ° °Follow these instructions at home: °Port care °· After your port is placed, you will get a manufacturer's information card. The card has information about your port. Keep this card with you at all times. °· Take care of the port as told by your health care provider. Ask your health care provider if you or a family member can get training for taking care of the port at home. A home health care nurse may also take care of the port. °· Make sure to remember what type of port you have. °Incision care °· Follow instructions from your health care provider about how to take care of your port insertion site. Make sure you: °? Wash your hands with soap and water before you change your bandage (dressing). If soap and water are not available, use hand sanitizer. °? Change your dressing as told by your health care provider. °? Leave stitches (sutures), skin glue, or adhesive strips in place. These skin closures may need to stay in place for 2 weeks or longer. If adhesive strip edges start to loosen and curl up, you may trim the loose edges. Do not remove adhesive strips completely unless your health care provider tells you to do that. °· Check your port insertion site every day for signs of infection. Check for: °? More redness, swelling, or pain. °? More fluid or blood. °? Warmth. °? Pus or a bad smell. °General instructions °· Do not take baths, swim, or use a hot tub until your health care provider approves. °· Do not lift anything that is heavier than 10 lb (4.5  kg) for a week, or as told by your health care provider. °· Ask your health care provider when it is okay to: °? Return to work or school. °? Resume usual physical activities or sports. °· Do not drive for 24 hours if you were given a medicine to help you relax (sedative). °· Take over-the-counter and prescription medicines only as told by your health care provider. °· Wear a medical alert bracelet in case of an emergency. This will tell any health care providers that you have a port. °· Keep all follow-up visits as told by your health care provider. This is important. °Contact a health care provider if: °· You cannot flush your port with saline as directed, or you cannot draw blood from the port. °· You have a fever or chills. °· You have more redness, swelling, or pain around your port insertion site. °· You have more fluid or blood coming from your port insertion site. °· Your port insertion site feels warm to the touch. °· You have pus or a bad smell coming from the port insertion site. °Get help right away if: °· You have chest pain or shortness of breath. °· You have bleeding from your port that you cannot control. °Summary °· Take care of the port as told by your health care provider. °· Change your dressing as told by your health care provider. °· Keep all follow-up visits as told by your health care provider. °  This information is not intended to replace advice given to you by your health care provider. Make sure you discuss any questions you have with your health care provider. °Document Released: 12/30/2012 Document Revised: 01/31/2016 Document Reviewed: 01/31/2016 °Elsevier Interactive Patient Education © 2017 [employer] ° °

## 2016-08-20 ENCOUNTER — Other Ambulatory Visit: Payer: Self-pay | Admitting: *Deleted

## 2016-08-20 DIAGNOSIS — C184 Malignant neoplasm of transverse colon: Secondary | ICD-10-CM

## 2016-08-21 ENCOUNTER — Ambulatory Visit (HOSPITAL_BASED_OUTPATIENT_CLINIC_OR_DEPARTMENT_OTHER): Payer: BLUE CROSS/BLUE SHIELD

## 2016-08-21 ENCOUNTER — Other Ambulatory Visit (HOSPITAL_BASED_OUTPATIENT_CLINIC_OR_DEPARTMENT_OTHER): Payer: BLUE CROSS/BLUE SHIELD

## 2016-08-21 DIAGNOSIS — C184 Malignant neoplasm of transverse colon: Secondary | ICD-10-CM

## 2016-08-21 DIAGNOSIS — C189 Malignant neoplasm of colon, unspecified: Secondary | ICD-10-CM

## 2016-08-21 LAB — CBC WITH DIFFERENTIAL (CANCER CENTER ONLY)
BASO#: 0 10*3/uL (ref 0.0–0.2)
BASO%: 0.3 % (ref 0.0–2.0)
EOS%: 1.1 % (ref 0.0–7.0)
Eosinophils Absolute: 0.1 10*3/uL (ref 0.0–0.5)
HEMATOCRIT: 33.6 % — AB (ref 34.8–46.6)
HGB: 11.2 g/dL — ABNORMAL LOW (ref 11.6–15.9)
LYMPH#: 1.3 10*3/uL (ref 0.9–3.3)
LYMPH%: 21.4 % (ref 14.0–48.0)
MCH: 30.7 pg (ref 26.0–34.0)
MCHC: 33.3 g/dL (ref 32.0–36.0)
MCV: 92 fL (ref 81–101)
MONO#: 0.7 10*3/uL (ref 0.1–0.9)
MONO%: 10.9 % (ref 0.0–13.0)
NEUT#: 4.1 10*3/uL (ref 1.5–6.5)
NEUT%: 66.3 % (ref 39.6–80.0)
Platelets: 381 10*3/uL (ref 145–400)
RBC: 3.65 10*6/uL — ABNORMAL LOW (ref 3.70–5.32)
RDW: 13.8 % (ref 11.1–15.7)
WBC: 6.1 10*3/uL (ref 3.9–10.0)

## 2016-08-21 LAB — CMP (CANCER CENTER ONLY)
ALT(SGPT): 69 U/L — ABNORMAL HIGH (ref 10–47)
AST: 54 U/L — ABNORMAL HIGH (ref 11–38)
Albumin: 3.4 g/dL (ref 3.3–5.5)
Alkaline Phosphatase: 182 U/L — ABNORMAL HIGH (ref 26–84)
BUN, Bld: 9 mg/dL (ref 7–22)
CALCIUM: 9.4 mg/dL (ref 8.0–10.3)
CHLORIDE: 109 meq/L — AB (ref 98–108)
CO2: 25 meq/L (ref 18–33)
Creat: 0.8 mg/dl (ref 0.6–1.2)
GLUCOSE: 86 mg/dL (ref 73–118)
POTASSIUM: 4.1 meq/L (ref 3.3–4.7)
Sodium: 140 mEq/L (ref 128–145)
Total Bilirubin: 0.5 mg/dl (ref 0.20–1.60)
Total Protein: 7 g/dL (ref 6.4–8.1)

## 2016-08-21 NOTE — Patient Instructions (Signed)
Implanted Port Home Guide An implanted port is a type of central line that is placed under the skin. Central lines are used to provide IV access when treatment or nutrition needs to be given through a person's veins. Implanted ports are used for long-term IV access. An implanted port may be placed because:  You need IV medicine that would be irritating to the small veins in your hands or arms.  You need long-term IV medicines, such as antibiotics.  You need IV nutrition for a long period.  You need frequent blood draws for lab tests.  You need dialysis.  Implanted ports are usually placed in the chest area, but they can also be placed in the upper arm, the abdomen, or the leg. An implanted port has two main parts:  Reservoir. The reservoir is round and will appear as a small, raised area under your skin. The reservoir is the part where a needle is inserted to give medicines or draw blood.  Catheter. The catheter is a thin, flexible tube that extends from the reservoir. The catheter is placed into a large vein. Medicine that is inserted into the reservoir goes into the catheter and then into the vein.  How will I care for my incision site? Do not get the incision site wet. Bathe or shower as directed by your health care provider. How is my port accessed? Special steps must be taken to access the port:  Before the port is accessed, a numbing cream can be placed on the skin. This helps numb the skin over the port site.  Your health care provider uses a sterile technique to access the port. ? Your health care provider must put on a mask and sterile gloves. ? The skin over your port is cleaned carefully with an antiseptic and allowed to dry. ? The port is gently pinched between sterile gloves, and a needle is inserted into the port.  Only "non-coring" port needles should be used to access the port. Once the port is accessed, a blood return should be checked. This helps ensure that the port  is in the vein and is not clogged.  If your port needs to remain accessed for a constant infusion, a clear (transparent) bandage will be placed over the needle site. The bandage and needle will need to be changed every week, or as directed by your health care provider.  Keep the bandage covering the needle clean and dry. Do not get it wet. Follow your health care provider's instructions on how to take a shower or bath while the port is accessed.  If your port does not need to stay accessed, no bandage is needed over the port.  What is flushing? Flushing helps keep the port from getting clogged. Follow your health care provider's instructions on how and when to flush the port. Ports are usually flushed with saline solution or a medicine called heparin. The need for flushing will depend on how the port is used.  If the port is used for intermittent medicines or blood draws, the port will need to be flushed: ? After medicines have been given. ? After blood has been drawn. ? As part of routine maintenance.  If a constant infusion is running, the port may not need to be flushed.  How long will my port stay implanted? The port can stay in for as long as your health care provider thinks it is needed. When it is time for the port to come out, surgery will be   done to remove it. The procedure is similar to the one performed when the port was put in. When should I seek immediate medical care? When you have an implanted port, you should seek immediate medical care if:  You notice a bad smell coming from the incision site.  You have swelling, redness, or drainage at the incision site.  You have more swelling or pain at the port site or the surrounding area.  You have a fever that is not controlled with medicine.  This information is not intended to replace advice given to you by your health care provider. Make sure you discuss any questions you have with your health care provider. Document  Released: 03/11/2005 Document Revised: 08/17/2015 Document Reviewed: 11/16/2012 Elsevier Interactive Patient Education  2017 [employer]  

## 2016-08-27 ENCOUNTER — Other Ambulatory Visit (HOSPITAL_BASED_OUTPATIENT_CLINIC_OR_DEPARTMENT_OTHER): Payer: BLUE CROSS/BLUE SHIELD

## 2016-08-27 ENCOUNTER — Ambulatory Visit (HOSPITAL_BASED_OUTPATIENT_CLINIC_OR_DEPARTMENT_OTHER): Payer: BLUE CROSS/BLUE SHIELD | Admitting: Hematology & Oncology

## 2016-08-27 ENCOUNTER — Ambulatory Visit (HOSPITAL_BASED_OUTPATIENT_CLINIC_OR_DEPARTMENT_OTHER): Payer: BLUE CROSS/BLUE SHIELD

## 2016-08-27 VITALS — BP 128/94 | HR 87 | Temp 98.2°F | Resp 18 | Wt 161.0 lb

## 2016-08-27 DIAGNOSIS — C189 Malignant neoplasm of colon, unspecified: Secondary | ICD-10-CM

## 2016-08-27 DIAGNOSIS — Z95828 Presence of other vascular implants and grafts: Secondary | ICD-10-CM

## 2016-08-27 DIAGNOSIS — C187 Malignant neoplasm of sigmoid colon: Secondary | ICD-10-CM

## 2016-08-27 DIAGNOSIS — C772 Secondary and unspecified malignant neoplasm of intra-abdominal lymph nodes: Principal | ICD-10-CM

## 2016-08-27 LAB — CMP (CANCER CENTER ONLY)
ALBUMIN: 3.6 g/dL (ref 3.3–5.5)
ALT(SGPT): 48 U/L — ABNORMAL HIGH (ref 10–47)
AST: 36 U/L (ref 11–38)
Alkaline Phosphatase: 164 U/L — ABNORMAL HIGH (ref 26–84)
BUN, Bld: 14 mg/dL (ref 7–22)
CALCIUM: 9.3 mg/dL (ref 8.0–10.3)
CHLORIDE: 104 meq/L (ref 98–108)
CO2: 26 meq/L (ref 18–33)
Creat: 0.7 mg/dl (ref 0.6–1.2)
Glucose, Bld: 90 mg/dL (ref 73–118)
POTASSIUM: 4.1 meq/L (ref 3.3–4.7)
Sodium: 136 mEq/L (ref 128–145)
Total Bilirubin: 0.5 mg/dl (ref 0.20–1.60)
Total Protein: 7.3 g/dL (ref 6.4–8.1)

## 2016-08-27 LAB — CBC WITH DIFFERENTIAL (CANCER CENTER ONLY)
BASO#: 0 10*3/uL (ref 0.0–0.2)
BASO%: 0.5 % (ref 0.0–2.0)
EOS ABS: 0.2 10*3/uL (ref 0.0–0.5)
EOS%: 2.7 % (ref 0.0–7.0)
HEMATOCRIT: 33.6 % — AB (ref 34.8–46.6)
HEMOGLOBIN: 11.5 g/dL — AB (ref 11.6–15.9)
LYMPH#: 1.5 10*3/uL (ref 0.9–3.3)
LYMPH%: 23.6 % (ref 14.0–48.0)
MCH: 31.3 pg (ref 26.0–34.0)
MCHC: 34.2 g/dL (ref 32.0–36.0)
MCV: 91 fL (ref 81–101)
MONO#: 0.7 10*3/uL (ref 0.1–0.9)
MONO%: 10.7 % (ref 0.0–13.0)
NEUT%: 62.5 % (ref 39.6–80.0)
NEUTROS ABS: 3.9 10*3/uL (ref 1.5–6.5)
Platelets: 398 10*3/uL (ref 145–400)
RBC: 3.68 10*6/uL — ABNORMAL LOW (ref 3.70–5.32)
RDW: 13.3 % (ref 11.1–15.7)
WBC: 6.3 10*3/uL (ref 3.9–10.0)

## 2016-08-27 LAB — CEA (IN HOUSE-CHCC): CEA (CHCC-In House): 1.61 ng/mL (ref 0.00–5.00)

## 2016-08-27 LAB — LACTATE DEHYDROGENASE: LDH: 213 U/L (ref 125–245)

## 2016-08-27 MED ORDER — HEPARIN SOD (PORK) LOCK FLUSH 100 UNIT/ML IV SOLN
500.0000 [IU] | Freq: Once | INTRAVENOUS | Status: AC
Start: 2016-08-27 — End: 2016-08-27
  Administered 2016-08-27: 500 [IU] via INTRAVENOUS
  Filled 2016-08-27: qty 5

## 2016-08-27 MED ORDER — SODIUM CHLORIDE 0.9% FLUSH
10.0000 mL | INTRAVENOUS | Status: DC | PRN
  Administered 2016-08-27: 10 mL via INTRAVENOUS
  Filled 2016-08-27: qty 10

## 2016-08-27 NOTE — Progress Notes (Signed)
Hematology and Oncology Follow Up Visit  Carla Mills [id number] 10-Jul-1970 46 y.o. 08/27/2016   Principle Diagnosis:   Recurrent colon cancer - HER2 (-)Zachery Dakins stable/MMR normal/BRAF -  Current Therapy:     FOLFOXIRI on 04/09/2016 - s/p cycle #7  Status post exploratory laparotomy with HIPEC - 07/29/2016     Interim History:  Carla Mills is back for follow-up. She still looks incredibly healthy. She, in all honesty, has really not had a problem with surgery. There's been no nausea or vomiting. There's been no abdominal pain. She has some discomfort over on the right side of the abdomen. She says this causes some discomfort when she takes a deep breath in. She has no obvious shortness of breath.  We have been watching her liver function test. They have normalized nicely. Her alkaline phosphatase is still up a little bit.  She's had no change in bowel or bladder habits. She's had no bleeding.  Today was her last day of Lovenox.   Her appetite is great. She had a very nice Memorial Day weekend.   There's been no bleeding.  She's had no hair loss. His been no mouth sores. She's had no fever. She had no leg swelling.   Overall, her performance status is ECOG 0.  Medications:  Current Outpatient Prescriptions:  .  acetaminophen (TYLENOL) 500 MG tablet, Take 1,000 mg by mouth every 8 (eight) hours as needed for moderate pain. , Disp: , Rfl:  .  dexamethasone (DECADRON) 4 MG tablet, Take 2 tablets (8 mg total) by mouth daily. Start the day after chemo and take for 2 days., Disp: 24 tablet, Rfl: 0 .  enoxaparin (LOVENOX) 40 MG/0.4ML injection, Inject 40 mg into the skin., Disp: , Rfl:  .  fexofenadine (ALLEGRA) 180 MG tablet, Take 180 mg by mouth daily as needed for allergies. , Disp: , Rfl:  .  fluticasone (FLONASE) 50 MCG/ACT nasal spray, Place 1 spray into both nostrils 2 (two) times daily. , Disp: , Rfl:  .  HYDROcodone-acetaminophen (NORCO/VICODIN) 5-325 MG tablet, Take 1-2  tablets by mouth every 6 (six) hours as needed., Disp: 15 tablet, Rfl: 0 .  ibuprofen (ADVIL,MOTRIN) 200 MG tablet, Take 400 mg by mouth every 6 (six) hours as needed for headache or mild pain., Disp: , Rfl:  .  lidocaine-prilocaine (EMLA) cream, Apply 1 application topically as needed. Apply to [facility] 1 hour prior to treatment., Disp: 30 g, Rfl: 3 .  loperamide (IMODIUM) 2 MG capsule, Take 1 capsule (2 mg total) by mouth as needed for diarrhea or loose stools. Take 2 capsules at the onset of diarrhea, then 1 every 2 hours until 12 hours with no BM. May take 2 every 4 hours at night. If diarrhea recurrs, repeat., Disp: 100 capsule, Rfl: 0 .  LORazepam (ATIVAN) 1 MG tablet, Take 1 tablet (1 mg total) by mouth every 6 (six) hours as needed for anxiety. Prn nausea/vomiting, Disp: 30 tablet, Rfl: 0 .  magic mouthwash w/lidocaine SOLN, Take 5 mLs by mouth 3 (three) times daily as needed for mouth pain., Disp: 120 mL, Rfl: 1 .  neomycin (MYCIFRADIN) 500 MG tablet, Take two tablets at 1:00pm, 2:00pm and 11:00pm the night before surgery., Disp: , Rfl:  .  nystatin (MYCOSTATIN) 100000 UNIT/ML suspension, 100,000 Units., Disp: , Rfl: 0 .  ondansetron (ZOFRAN) 8 MG tablet, Take 1 tablet (8 mg total) by mouth every 8 (eight) hours as needed for nausea or vomiting. Start Day 3 after chemo., Disp: 20  tablet, Rfl: 0 .  prochlorperazine (COMPAZINE) 10 MG tablet, Take 1 tablet (10 mg total) by mouth every 6 (six) hours as needed for nausea or vomiting., Disp: 30 tablet, Rfl: 1 .  promethazine (PHENERGAN) 25 MG tablet, Take 1 tablet (25 mg total) by mouth every 6 (six) hours as needed for nausea or vomiting., Disp: 12 tablet, Rfl: 0 .  traMADol (ULTRAM) 50 MG tablet, Take 1 tablet (50 mg total) by mouth every 6 (six) hours as needed. (Patient taking differently: Take 50 mg by mouth every 6 (six) hours as needed for moderate pain. ), Disp: 90 tablet, Rfl: 0 No current facility-administered medications for this visit.    Facility-Administered Medications Ordered in Other Visits:  .  clindamycin (CLEOCIN) 900 mg in dextrose 5 % 50 mL IVPB, 900 mg, Intravenous, 60 min Pre-Op **AND** gentamicin (GARAMYCIN) 340 mg in dextrose 5 % 50 mL IVPB, 5 mg/kg, Intravenous, 60 min Pre-Op, [name], MD .  dextrose 5 % solution, , Intravenous, Continuous, Shabnam Ladd, Rudell Cobb, MD, Stopped at 04/09/16 1418 .  heparin lock flush 100 unit/mL, 500 Units, Intracatheter, Once PRN, Volanda Napoleon, MD .  sodium chloride flush (NS) 0.9 % injection 10 mL, 10 mL, Intracatheter, PRN, Volanda Napoleon, MD .  sodium chloride flush (NS) 0.9 % injection 10 mL, 10 mL, Intravenous, PRN, [place], Holli Humbles, NP, 10 mL at 08/27/16 0956  Allergies:  Allergies  Allergen Reactions  . Bactrim [Sulfamethoxazole-Trimethoprim]   . Oxaprozin Hives    REACTION: Hives  . Penicillins Hives    REACTION: Hives Has patient had a PCN reaction causing immediate rash, facial/tongue/throat swelling, SOB or lightheadedness with hypotension: no Has patient had a PCN reaction causing severe rash involving mucus membranes or skin necrosis: {no Has patient had a PCN reaction that required hospitalization no Has patient had a PCN reaction occurring within the last 10 years: no If all of the above answers are "NO", then may proceed with Cephalosporin use.  . Sulfonamide Derivatives Swelling    Massive extremity swelling     Past Medical History, Surgical history, Social history, and Family History were reviewed and updated.  Review of Systems:  As above  Physical Exam:  weight is 161 lb (73 kg). Her oral temperature is 98.2 F (36.8 C). Her blood pressure is 128/94 (abnormal) and her pulse is 87. Her respiration is 18 and oxygen saturation is 99%.   Wt Readings from Last 3 Encounters:  08/27/16 161 lb (73 kg)  08/16/16 162 lb 0.6 oz (73.5 kg)  07/09/16 161 lb 1.3 oz (73.1 kg)      Well-developed and well-nourished white female in no obvious  distress. Head and neck exam shows no ocular or oral lesions. She has no palpable cervical or supraclavicular lymph nodes. Lungs are clear. Cardiac exam regular rate and rhythm with no murmurs, rubs or bruits. Abdomen is soft. She has a dressing over the laparotomy wound. There is no fluid wave. She has no guarding or rebound tenderness. She has no liver or spleen tip. Her laparoscopy scars are healing.  Back exam shows no tenderness over the spine, ribs or hips. Extremities shows no clubbing, cyanosis or edema. She's good range motion of her joints. Skin exam shows no rashes, ecchymoses or petechia. Neurological exam shows no focal neurological deficits.  Lab Results  Component Value Date   WBC 6.3 08/27/2016   HGB 11.5 (L) 08/27/2016   HCT 33.6 (L) 08/27/2016   MCV 91 08/27/2016  PLT 398 08/27/2016     Chemistry      Component Value Date/Time   NA 136 08/27/2016 0953   NA 141 07/30/2016 0750   K 4.1 08/27/2016 0953   K 4.2 07/30/2016 0750   CL 104 08/27/2016 0953   CO2 26 08/27/2016 0953   CO2 26 07/30/2016 0750   BUN 14 08/27/2016 0953   BUN 12.1 07/30/2016 0750   CREATININE 0.7 08/27/2016 0953   CREATININE 0.7 07/30/2016 0750      Component Value Date/Time   CALCIUM 9.3 08/27/2016 0953   CALCIUM 9.5 07/30/2016 0750   ALKPHOS 164 (H) 08/27/2016 0953   ALKPHOS 72 07/30/2016 0750   AST 36 08/27/2016 0953   AST 22 07/30/2016 0750   ALT 48 (H) 08/27/2016 0953   ALT 22 07/30/2016 0750   BILITOT 0.50 08/27/2016 0953   BILITOT 0.54 07/30/2016 0750         Impression and Plan: Carla Mills is a 46 year old white female. She initially presented with stage II colon cancer back in I think December 2016. She had a very high Oncotype score of 37. Because of this, we went ahead and gave her adjuvant chemotherapy with Xeloda. Despite this, her cancer has recurred.  I am incredibly pleased that she is done so well. Her response to chemotherapy has been fantastic.  We will need to  continue her on systemic chemotherapy. I think she has 5 more treatments to go. Again she is responded very nicely. She did have some residual disease that was resected. I will like to think that the intraperitoneal chemotherapy has allowed for resolution of any microscopic disease.  Her LFTs have normalized. Her blood counts look great.  We probably can get her started back on treatment in a couple weeks. I think this would be reasonable. She sees Dr. She at [facility] on June 19. We probably will get started with systemic therapy afterwards.  Volanda Napoleon, MD 6/5/201810:54 AM

## 2016-08-28 ENCOUNTER — Encounter: Payer: Self-pay | Admitting: *Deleted

## 2016-09-17 ENCOUNTER — Ambulatory Visit (HOSPITAL_BASED_OUTPATIENT_CLINIC_OR_DEPARTMENT_OTHER): Payer: BLUE CROSS/BLUE SHIELD

## 2016-09-17 ENCOUNTER — Ambulatory Visit (HOSPITAL_BASED_OUTPATIENT_CLINIC_OR_DEPARTMENT_OTHER): Payer: BLUE CROSS/BLUE SHIELD | Admitting: Hematology & Oncology

## 2016-09-17 ENCOUNTER — Other Ambulatory Visit: Payer: Self-pay | Admitting: *Deleted

## 2016-09-17 ENCOUNTER — Other Ambulatory Visit: Payer: BLUE CROSS/BLUE SHIELD

## 2016-09-17 VITALS — BP 121/74 | HR 83 | Temp 98.3°F | Resp 16 | Wt 164.0 lb

## 2016-09-17 DIAGNOSIS — C772 Secondary and unspecified malignant neoplasm of intra-abdominal lymph nodes: Principal | ICD-10-CM

## 2016-09-17 DIAGNOSIS — C189 Malignant neoplasm of colon, unspecified: Secondary | ICD-10-CM

## 2016-09-17 DIAGNOSIS — C187 Malignant neoplasm of sigmoid colon: Secondary | ICD-10-CM

## 2016-09-17 DIAGNOSIS — Z5111 Encounter for antineoplastic chemotherapy: Secondary | ICD-10-CM | POA: Diagnosis not present

## 2016-09-17 LAB — CBC WITH DIFFERENTIAL (CANCER CENTER ONLY)
BASO#: 0 10*3/uL (ref 0.0–0.2)
BASO%: 0.3 % (ref 0.0–2.0)
EOS%: 4.5 % (ref 0.0–7.0)
Eosinophils Absolute: 0.3 10*3/uL (ref 0.0–0.5)
HCT: 37.8 % (ref 34.8–46.6)
HGB: 12.8 g/dL (ref 11.6–15.9)
LYMPH#: 1.9 10*3/uL (ref 0.9–3.3)
LYMPH%: 27.3 % (ref 14.0–48.0)
MCH: 30.5 pg (ref 26.0–34.0)
MCHC: 33.9 g/dL (ref 32.0–36.0)
MCV: 90 fL (ref 81–101)
MONO#: 0.5 10*3/uL (ref 0.1–0.9)
MONO%: 7 % (ref 0.0–13.0)
NEUT#: 4.2 10*3/uL (ref 1.5–6.5)
NEUT%: 60.9 % (ref 39.6–80.0)
PLATELETS: 264 10*3/uL (ref 145–400)
RBC: 4.19 10*6/uL (ref 3.70–5.32)
RDW: 13.1 % (ref 11.1–15.7)
WBC: 6.9 10*3/uL (ref 3.9–10.0)

## 2016-09-17 LAB — CMP (CANCER CENTER ONLY)
ALK PHOS: 87 U/L — AB (ref 26–84)
ALT: 25 U/L (ref 10–47)
AST: 25 U/L (ref 11–38)
Albumin: 3.5 g/dL (ref 3.3–5.5)
BUN: 11 mg/dL (ref 7–22)
CO2: 28 mEq/L (ref 18–33)
CREATININE: 0.6 mg/dL (ref 0.6–1.2)
Calcium: 9.2 mg/dL (ref 8.0–10.3)
Chloride: 106 mEq/L (ref 98–108)
Glucose, Bld: 92 mg/dL (ref 73–118)
Potassium: 3.8 mEq/L (ref 3.3–4.7)
SODIUM: 142 meq/L (ref 128–145)
TOTAL PROTEIN: 7 g/dL (ref 6.4–8.1)
Total Bilirubin: 0.8 mg/dl (ref 0.20–1.60)

## 2016-09-17 LAB — CEA (IN HOUSE-CHCC): CEA (CHCC-IN HOUSE): 1.71 ng/mL (ref 0.00–5.00)

## 2016-09-17 MED ORDER — ATROPINE SULFATE 1 MG/ML IJ SOLN: 0.5000 mg | Freq: Once | INTRAMUSCULAR | Status: DC | PRN

## 2016-09-17 MED ORDER — PROCHLORPERAZINE MALEATE 10 MG PO TABS: 10.0000 mg | ORAL_TABLET | Freq: Four times a day (QID) | ORAL | 1 refills | Status: DC | PRN

## 2016-09-17 MED ORDER — DEXTROSE 5 % IV SOLN
132.0000 mg/m2 | Freq: Once | INTRAVENOUS | Status: AC
  Administered 2016-09-17: 240 mg via INTRAVENOUS
  Filled 2016-09-17: qty 10

## 2016-09-17 MED ORDER — DEXTROSE 5 % IV SOLN
Freq: Once | INTRAVENOUS | Status: AC
  Administered 2016-09-17: 11:00:00 via INTRAVENOUS

## 2016-09-17 MED ORDER — OXALIPLATIN CHEMO INJECTION 100 MG/20ML
68.0000 mg/m2 | Freq: Once | INTRAVENOUS | Status: AC
  Administered 2016-09-17: 120 mg via INTRAVENOUS
  Filled 2016-09-17: qty 20

## 2016-09-17 MED ORDER — PALONOSETRON HCL INJECTION 0.25 MG/5ML
INTRAVENOUS | Status: AC
  Filled 2016-09-17: qty 5

## 2016-09-17 MED ORDER — SODIUM CHLORIDE 0.9 % IV SOLN
Freq: Once | INTRAVENOUS | Status: AC
  Administered 2016-09-17: 09:00:00 via INTRAVENOUS

## 2016-09-17 MED ORDER — PALONOSETRON HCL INJECTION 0.25 MG/5ML
0.2500 mg | Freq: Once | INTRAVENOUS | Status: AC
  Administered 2016-09-17: 0.25 mg via INTRAVENOUS

## 2016-09-17 MED ORDER — LEUCOVORIN CALCIUM INJECTION 350 MG
197.0000 mg/m2 | Freq: Once | INTRAVENOUS | Status: AC
  Administered 2016-09-17: 350 mg via INTRAVENOUS
  Filled 2016-09-17: qty 17.5

## 2016-09-17 MED ORDER — SODIUM CHLORIDE 0.9 % IV SOLN
2560.0000 mg/m2 | INTRAVENOUS | Status: DC
  Administered 2016-09-17: 4550 mg via INTRAVENOUS
  Filled 2016-09-17: qty 91

## 2016-09-17 MED ORDER — DEXAMETHASONE SODIUM PHOSPHATE 10 MG/ML IJ SOLN
INTRAMUSCULAR | Status: AC
  Filled 2016-09-17: qty 1

## 2016-09-17 MED ORDER — DEXAMETHASONE SODIUM PHOSPHATE 10 MG/ML IJ SOLN
10.0000 mg | Freq: Once | INTRAMUSCULAR | Status: AC
  Administered 2016-09-17: 10 mg via INTRAVENOUS

## 2016-09-17 MED FILL — PROCHLORPERAZINE 10 MG TAB: 10 | 8 days supply | Qty: 30 | Fill #0

## 2016-09-17 NOTE — Addendum Note (Signed)
Addended by: Burney Gauze R on: 09/17/2016 09:05 AM   Modules accepted: Orders

## 2016-09-17 NOTE — Patient Instructions (Signed)
[facility] Discharge Instructions for Patients Receiving Chemotherapy  Today you received the following chemotherapy agents:  Oxaliplatin, Leucovorin, Irinotecan and 5FU  To help prevent nausea and vomiting after your treatment, we encourage you to take your nausea medication as ordered per MD.   If you develop nausea and vomiting that is not controlled by your nausea medication, call the clinic.   BELOW ARE SYMPTOMS THAT SHOULD BE REPORTED IMMEDIATELY:  *FEVER GREATER THAN 100.5 F  *CHILLS WITH OR WITHOUT FEVER  NAUSEA AND VOMITING THAT IS NOT CONTROLLED WITH YOUR NAUSEA MEDICATION  *UNUSUAL SHORTNESS OF BREATH  *UNUSUAL BRUISING OR BLEEDING  TENDERNESS IN MOUTH AND THROAT WITH OR WITHOUT PRESENCE OF ULCERS  *URINARY PROBLEMS  *BOWEL PROBLEMS  UNUSUAL RASH Items with * indicate a potential emergency and should be followed up as soon as possible.  Feel free to call the clinic you have any questions or concerns. The clinic phone number is [phone].  Please show the [facility] at check-in to the Emergency Department and triage nurse.

## 2016-09-17 NOTE — Patient Instructions (Signed)
Implanted Port Home Guide An implanted port is a type of central line that is placed under the skin. Central lines are used to provide IV access when treatment or nutrition needs to be given through a person's veins. Implanted ports are used for long-term IV access. An implanted port may be placed because:  You need IV medicine that would be irritating to the small veins in your hands or arms.  You need long-term IV medicines, such as antibiotics.  You need IV nutrition for a long period.  You need frequent blood draws for lab tests.  You need dialysis.  Implanted ports are usually placed in the chest area, but they can also be placed in the upper arm, the abdomen, or the leg. An implanted port has two main parts:  Reservoir. The reservoir is round and will appear as a small, raised area under your skin. The reservoir is the part where a needle is inserted to give medicines or draw blood.  Catheter. The catheter is a thin, flexible tube that extends from the reservoir. The catheter is placed into a large vein. Medicine that is inserted into the reservoir goes into the catheter and then into the vein.  How will I care for my incision site? Do not get the incision site wet. Bathe or shower as directed by your health care provider. How is my port accessed? Special steps must be taken to access the port:  Before the port is accessed, a numbing cream can be placed on the skin. This helps numb the skin over the port site.  Your health care provider uses a sterile technique to access the port. ? Your health care provider must put on a mask and sterile gloves. ? The skin over your port is cleaned carefully with an antiseptic and allowed to dry. ? The port is gently pinched between sterile gloves, and a needle is inserted into the port.  Only "non-coring" port needles should be used to access the port. Once the port is accessed, a blood return should be checked. This helps ensure that the port  is in the vein and is not clogged.  If your port needs to remain accessed for a constant infusion, a clear (transparent) bandage will be placed over the needle site. The bandage and needle will need to be changed every week, or as directed by your health care provider.  Keep the bandage covering the needle clean and dry. Do not get it wet. Follow your health care provider's instructions on how to take a shower or bath while the port is accessed.  If your port does not need to stay accessed, no bandage is needed over the port.  What is flushing? Flushing helps keep the port from getting clogged. Follow your health care provider's instructions on how and when to flush the port. Ports are usually flushed with saline solution or a medicine called heparin. The need for flushing will depend on how the port is used.  If the port is used for intermittent medicines or blood draws, the port will need to be flushed: ? After medicines have been given. ? After blood has been drawn. ? As part of routine maintenance.  If a constant infusion is running, the port may not need to be flushed.  How long will my port stay implanted? The port can stay in for as long as your health care provider thinks it is needed. When it is time for the port to come out, surgery will be   done to remove it. The procedure is similar to the one performed when the port was put in. When should I seek immediate medical care? When you have an implanted port, you should seek immediate medical care if:  You notice a bad smell coming from the incision site.  You have swelling, redness, or drainage at the incision site.  You have more swelling or pain at the port site or the surrounding area.  You have a fever that is not controlled with medicine.  This information is not intended to replace advice given to you by your health care provider. Make sure you discuss any questions you have with your health care provider. Document  Released: 03/11/2005 Document Revised: 08/17/2015 Document Reviewed: 11/16/2012 Elsevier Interactive Patient Education  2017 [employer]  

## 2016-09-17 NOTE — Progress Notes (Signed)
Hematology and Oncology Follow Up Visit  Bo Rogue [id number] 1970/04/20 46 y.o. 09/17/2016   Principle Diagnosis:   Recurrent colon cancer - HER2 (-)/MSI stable/MMR normal/BRAF -  Current Therapy:     FOLFOXIRI on 04/09/2016 - s/p cycle #7  Status post exploratory laparotomy with HIPEC - 07/29/2016     Interim History:  Ms. Parish is back for follow-up. She still looks incredibly healthy. She, in all honesty, has really not had a problem with surgery. There's been no nausea or vomiting. There's been no abdominal pain. She has some discomfort over on the right side of the abdomen. She says this causes some discomfort when she takes a deep breath in. She has no obvious shortness of breath.  She saw her surgical oncologist last week. He was very pleased with her progress. He feels that now is the time that she can finish up her systemic chemotherapy.  We have been watching her liver function test. They have normalized nicely. Her alkaline phosphatase is still up a little bit.  She's had no change in bowel or bladder habits. She's had no bleeding.  Her appetite is great.   She and her family will be going to [facility] over July 4. There will be a basketball tournament for one of her daughters.  There's been no bleeding.  She's had no hair loss. His been no mouth sores. She's had no fever. She had no leg swelling.   Overall, her performance status is ECOG 0.  Medications:  Current Outpatient Prescriptions:  .  acetaminophen (TYLENOL) 500 MG tablet, Take 1,000 mg by mouth every 8 (eight) hours as needed for moderate pain. , Disp: , Rfl:  .  dexamethasone (DECADRON) 4 MG tablet, Take 2 tablets (8 mg total) by mouth daily. Start the day after chemo and take for 2 days., Disp: 24 tablet, Rfl: 0 .  fexofenadine (ALLEGRA) 180 MG tablet, Take 180 mg by mouth daily as needed for allergies. , Disp: , Rfl:  .  fluticasone (FLONASE) 50 MCG/ACT nasal spray, Place 1 spray into both  nostrils 2 (two) times daily. , Disp: , Rfl:  .  HYDROcodone-acetaminophen (NORCO/VICODIN) 5-325 MG tablet, Take 1-2 tablets by mouth every 6 (six) hours as needed., Disp: 15 tablet, Rfl: 0 .  ibuprofen (ADVIL,MOTRIN) 200 MG tablet, Take 400 mg by mouth every 6 (six) hours as needed for headache or mild pain., Disp: , Rfl:  .  lidocaine-prilocaine (EMLA) cream, Apply 1 application topically as needed. Apply to [facility] 1 hour prior to treatment., Disp: 30 g, Rfl: 3 .  loperamide (IMODIUM) 2 MG capsule, Take 1 capsule (2 mg total) by mouth as needed for diarrhea or loose stools. Take 2 capsules at the onset of diarrhea, then 1 every 2 hours until 12 hours with no BM. May take 2 every 4 hours at night. If diarrhea recurrs, repeat., Disp: 100 capsule, Rfl: 0 .  LORazepam (ATIVAN) 1 MG tablet, Take 1 tablet (1 mg total) by mouth every 6 (six) hours as needed for anxiety. Prn nausea/vomiting, Disp: 30 tablet, Rfl: 0 .  magic mouthwash w/lidocaine SOLN, Take 5 mLs by mouth 3 (three) times daily as needed for mouth pain., Disp: 120 mL, Rfl: 1 .  neomycin (MYCIFRADIN) 500 MG tablet, Take two tablets at 1:00pm, 2:00pm and 11:00pm the night before surgery., Disp: , Rfl:  .  nystatin (MYCOSTATIN) 100000 UNIT/ML suspension, 100,000 Units., Disp: , Rfl: 0 .  ondansetron (ZOFRAN) 8 MG tablet, Take 1 tablet (8 mg  total) by mouth every 8 (eight) hours as needed for nausea or vomiting. Start Day 3 after chemo., Disp: 20 tablet, Rfl: 0 .  prochlorperazine (COMPAZINE) 10 MG tablet, Take 1 tablet (10 mg total) by mouth every 6 (six) hours as needed for nausea or vomiting., Disp: 30 tablet, Rfl: 1 .  promethazine (PHENERGAN) 25 MG tablet, Take 1 tablet (25 mg total) by mouth every 6 (six) hours as needed for nausea or vomiting., Disp: 12 tablet, Rfl: 0 .  traMADol (ULTRAM) 50 MG tablet, Take 1 tablet (50 mg total) by mouth every 6 (six) hours as needed. (Patient taking differently: Take 50 mg by mouth every 6 (six) hours as  needed for moderate pain. ), Disp: 90 tablet, Rfl: 0 No current facility-administered medications for this visit.   Facility-Administered Medications Ordered in Other Visits:  .  clindamycin (CLEOCIN) 900 mg in dextrose 5 % 50 mL IVPB, 900 mg, Intravenous, 60 min Pre-Op **AND** gentamicin (GARAMYCIN) 340 mg in dextrose 5 % 50 mL IVPB, 5 mg/kg, Intravenous, 60 min Pre-Op, [name], MD .  dextrose 5 % solution, , Intravenous, Continuous, Eythan Jayne, Rudell Cobb, MD, Stopped at 04/09/16 1418 .  heparin lock flush 100 unit/mL, 500 Units, Intracatheter, Once PRN, Volanda Napoleon, MD .  sodium chloride flush (NS) 0.9 % injection 10 mL, 10 mL, Intracatheter, PRN, Volanda Napoleon, MD  Allergies:  Allergies  Allergen Reactions  . Bactrim [Sulfamethoxazole-Trimethoprim]   . Oxaprozin Hives    REACTION: Hives  . Penicillins Hives    REACTION: Hives Has patient had a PCN reaction causing immediate rash, facial/tongue/throat swelling, SOB or lightheadedness with hypotension: no Has patient had a PCN reaction causing severe rash involving mucus membranes or skin necrosis: {no Has patient had a PCN reaction that required hospitalization no Has patient had a PCN reaction occurring within the last 10 years: no If all of the above answers are "NO", then may proceed with Cephalosporin use.  . Sulfonamide Derivatives Swelling    Massive extremity swelling     Past Medical History, Surgical history, Social history, and Family History were reviewed and updated.  Review of Systems:  As above  Physical Exam:  vitals were not taken for this visit.  Wt Readings from Last 3 Encounters:  09/17/16 164 lb (74.4 kg)  08/27/16 161 lb (73 kg)  08/16/16 162 lb 0.6 oz (73.5 kg)      Well-developed and well-nourished white female in no obvious distress. Head and neck exam shows no ocular or oral lesions. She has no palpable cervical or supraclavicular lymph nodes. Lungs are clear. Cardiac exam regular rate  and rhythm with no murmurs, rubs or bruits. Abdomen is soft. She has a dressing over the laparotomy wound. There is no fluid wave. She has no guarding or rebound tenderness. She has no liver or spleen tip. Her laparoscopy scars are healing.  Back exam shows no tenderness over the spine, ribs or hips. Extremities shows no clubbing, cyanosis or edema. She's good range motion of her joints. Skin exam shows no rashes, ecchymoses or petechia. Neurological exam shows no focal neurological deficits.  Lab Results  Component Value Date   WBC 6.9 09/17/2016   HGB 12.8 09/17/2016   HCT 37.8 09/17/2016   MCV 90 09/17/2016   PLT 264 09/17/2016     Chemistry      Component Value Date/Time   NA 136 08/27/2016 0953   NA 141 07/30/2016 0750   K 4.1 08/27/2016 0953  K 4.2 07/30/2016 0750   CL 104 08/27/2016 0953   CO2 26 08/27/2016 0953   CO2 26 07/30/2016 0750   BUN 14 08/27/2016 0953   BUN 12.1 07/30/2016 0750   CREATININE 0.7 08/27/2016 0953   CREATININE 0.7 07/30/2016 0750      Component Value Date/Time   CALCIUM 9.3 08/27/2016 0953   CALCIUM 9.5 07/30/2016 0750   ALKPHOS 164 (H) 08/27/2016 0953   ALKPHOS 72 07/30/2016 0750   AST 36 08/27/2016 0953   AST 22 07/30/2016 0750   ALT 48 (H) 08/27/2016 0953   ALT 22 07/30/2016 0750   BILITOT 0.50 08/27/2016 0953   BILITOT 0.54 07/30/2016 0750         Impression and Plan: Ms. Spilde is a 46 year old white female. She initially presented with stage II colon cancer back in I think December 2016. She had a very high Oncotype score of 37. Because of this, we went ahead and gave her adjuvant chemotherapy with Xeloda. Despite this, her cancer has recurred.  I am incredibly pleased that she is done so well. Her response to chemotherapy has been fantastic.  We will need to continue her on systemic chemotherapy. We will go ahead and start today. This will be her eighth of 12 cycles.   We will plan to get her back in 2 more weeks for her ninth  cycle.  Volanda Napoleon, MD 6/26/20188:45 AM

## 2016-09-19 ENCOUNTER — Ambulatory Visit (HOSPITAL_BASED_OUTPATIENT_CLINIC_OR_DEPARTMENT_OTHER): Payer: BLUE CROSS/BLUE SHIELD

## 2016-09-19 DIAGNOSIS — C187 Malignant neoplasm of sigmoid colon: Secondary | ICD-10-CM | POA: Diagnosis not present

## 2016-09-19 DIAGNOSIS — C772 Secondary and unspecified malignant neoplasm of intra-abdominal lymph nodes: Secondary | ICD-10-CM

## 2016-09-19 DIAGNOSIS — Z452 Encounter for adjustment and management of vascular access device: Secondary | ICD-10-CM

## 2016-09-19 MED ORDER — HEPARIN SOD (PORK) LOCK FLUSH 100 UNIT/ML IV SOLN
500.0000 [IU] | Freq: Once | INTRAVENOUS | Status: AC | PRN
  Administered 2016-09-19: 500 [IU]
  Filled 2016-09-19: qty 5

## 2016-09-19 MED ORDER — SODIUM CHLORIDE 0.9% FLUSH
10.0000 mL | INTRAVENOUS | Status: DC | PRN
  Administered 2016-09-19: 10 mL
  Filled 2016-09-19: qty 10

## 2016-09-19 NOTE — Patient Instructions (Signed)
Fluorouracil, 5-FU injection What is this medicine? FLUOROURACIL, 5-FU (flure oh YOOR a sil) is a chemotherapy drug. It slows the growth of cancer cells. This medicine is used to treat many types of cancer like breast cancer, colon or rectal cancer, pancreatic cancer, and stomach cancer. This medicine may be used for other purposes; ask your health care provider or pharmacist if you have questions. COMMON BRAND NAME(S): Adrucil What should I tell my health care provider before I take this medicine? They need to know if you have any of these conditions: -blood disorders -dihydropyrimidine dehydrogenase (DPD) deficiency -infection (especially a virus infection such as chickenpox, cold sores, or herpes) -kidney disease -liver disease -malnourished, poor nutrition -recent or ongoing radiation therapy -an unusual or allergic reaction to fluorouracil, other chemotherapy, other medicines, foods, dyes, or preservatives -pregnant or trying to get pregnant -breast-feeding How should I use this medicine? This drug is given as an infusion or injection into a vein. It is administered in a hospital or clinic by a specially trained health care professional. Talk to your pediatrician regarding the use of this medicine in children. Special care may be needed. Overdosage: If you think you have taken too much of this medicine contact a poison control center or emergency room at once. NOTE: This medicine is only for you. Do not share this medicine with others. What if I miss a dose? It is important not to miss your dose. Call your doctor or health care professional if you are unable to keep an appointment. What may interact with this medicine? -allopurinol -cimetidine -dapsone -digoxin -hydroxyurea -leucovorin -levamisole -medicines for seizures like ethotoin, fosphenytoin, phenytoin -medicines to increase blood counts like filgrastim, pegfilgrastim, sargramostim -medicines that treat or prevent blood  clots like warfarin, enoxaparin, and dalteparin -methotrexate -metronidazole -pyrimethamine -some other chemotherapy drugs like busulfan, cisplatin, estramustine, vinblastine -trimethoprim -trimetrexate -vaccines Talk to your doctor or health care professional before taking any of these medicines: -acetaminophen -aspirin -ibuprofen -ketoprofen -naproxen This list may not describe all possible interactions. Give your health care provider a list of all the medicines, herbs, non-prescription drugs, or dietary supplements you use. Also tell them if you smoke, drink alcohol, or use illegal drugs. Some items may interact with your medicine. What should I watch for while using this medicine? Visit your doctor for checks on your progress. This drug may make you feel generally unwell. This is not uncommon, as chemotherapy can affect healthy cells as well as cancer cells. Report any side effects. Continue your course of treatment even though you feel ill unless your doctor tells you to stop. In some cases, you may be given additional medicines to help with side effects. Follow all directions for their use. Call your doctor or health care professional for advice if you get a fever, chills or sore throat, or other symptoms of a cold or flu. Do not treat yourself. This drug decreases your body's ability to fight infections. Try to avoid being around people who are sick. This medicine may increase your risk to bruise or bleed. Call your doctor or health care professional if you notice any unusual bleeding. Be careful brushing and flossing your teeth or using a toothpick because you may get an infection or bleed more easily. If you have any dental work done, tell your dentist you are receiving this medicine. Avoid taking products that contain aspirin, acetaminophen, ibuprofen, naproxen, or ketoprofen unless instructed by your doctor. These medicines may hide a fever. Do not become pregnant while taking this    medicine. Women should inform their doctor if they wish to become pregnant or think they might be pregnant. There is a potential for serious side effects to an unborn child. Talk to your health care professional or pharmacist for more information. Do not breast-feed an infant while taking this medicine. Men should inform their doctor if they wish to father a child. This medicine may lower sperm counts. Do not treat diarrhea with over the counter products. Contact your doctor if you have diarrhea that lasts more than 2 days or if it is severe and watery. This medicine can make you more sensitive to the sun. Keep out of the sun. If you cannot avoid being in the sun, wear protective clothing and use sunscreen. Do not use sun lamps or tanning beds/booths. What side effects may I notice from receiving this medicine? Side effects that you should report to your doctor or health care professional as soon as possible: -allergic reactions like skin rash, itching or hives, swelling of the face, lips, or tongue -low blood counts - this medicine may decrease the number of white blood cells, red blood cells and platelets. You may be at increased risk for infections and bleeding. -signs of infection - fever or chills, cough, sore throat, pain or difficulty passing urine -signs of decreased platelets or bleeding - bruising, pinpoint red spots on the skin, black, tarry stools, blood in the urine -signs of decreased red blood cells - unusually weak or tired, fainting spells, lightheadedness -breathing problems -changes in vision -chest pain -mouth sores -nausea and vomiting -pain, swelling, redness at site where injected -pain, tingling, numbness in the hands or feet -redness, swelling, or sores on hands or feet -stomach pain -unusual bleeding Side effects that usually do not require medical attention (report to your doctor or health care professional if they continue or are bothersome): -changes in finger or  toe nails -diarrhea -dry or itchy skin -hair loss -headache -loss of appetite -sensitivity of eyes to the light -stomach upset -unusually teary eyes This list may not describe all possible side effects. Call your doctor for medical advice about side effects. You may report side effects to FDA at 1-800-FDA-1088. Where should I keep my medicine? This drug is given in a hospital or clinic and will not be stored at home. NOTE: This sheet is a summary. It may not cover all possible information. If you have questions about this medicine, talk to your doctor, pharmacist, or health care provider.  2018 Elsevier/Gold Standard (2007-07-15 13:53:16)  

## 2016-10-01 ENCOUNTER — Other Ambulatory Visit (HOSPITAL_BASED_OUTPATIENT_CLINIC_OR_DEPARTMENT_OTHER): Payer: BLUE CROSS/BLUE SHIELD

## 2016-10-01 ENCOUNTER — Ambulatory Visit (HOSPITAL_BASED_OUTPATIENT_CLINIC_OR_DEPARTMENT_OTHER): Payer: BLUE CROSS/BLUE SHIELD

## 2016-10-01 ENCOUNTER — Ambulatory Visit (HOSPITAL_BASED_OUTPATIENT_CLINIC_OR_DEPARTMENT_OTHER): Payer: BLUE CROSS/BLUE SHIELD | Admitting: Hematology & Oncology

## 2016-10-01 ENCOUNTER — Ambulatory Visit: Payer: BLUE CROSS/BLUE SHIELD

## 2016-10-01 VITALS — BP 124/72 | HR 73 | Temp 97.6°F | Resp 18 | Wt 165.0 lb

## 2016-10-01 DIAGNOSIS — C187 Malignant neoplasm of sigmoid colon: Secondary | ICD-10-CM

## 2016-10-01 DIAGNOSIS — Z5111 Encounter for antineoplastic chemotherapy: Secondary | ICD-10-CM | POA: Diagnosis not present

## 2016-10-01 DIAGNOSIS — C772 Secondary and unspecified malignant neoplasm of intra-abdominal lymph nodes: Secondary | ICD-10-CM

## 2016-10-01 LAB — CBC WITH DIFFERENTIAL (CANCER CENTER ONLY)
BASO#: 0 10*3/uL (ref 0.0–0.2)
BASO%: 0.3 % (ref 0.0–2.0)
EOS ABS: 0.1 10*3/uL (ref 0.0–0.5)
EOS%: 2.1 % (ref 0.0–7.0)
HCT: 37.4 % (ref 34.8–46.6)
HEMOGLOBIN: 12.7 g/dL (ref 11.6–15.9)
LYMPH#: 1.5 10*3/uL (ref 0.9–3.3)
LYMPH%: 23.5 % (ref 14.0–48.0)
MCH: 30.5 pg (ref 26.0–34.0)
MCHC: 34 g/dL (ref 32.0–36.0)
MCV: 90 fL (ref 81–101)
MONO#: 0.6 10*3/uL (ref 0.1–0.9)
MONO%: 9.6 % (ref 0.0–13.0)
NEUT%: 64.5 % (ref 39.6–80.0)
NEUTROS ABS: 4 10*3/uL (ref 1.5–6.5)
Platelets: 262 10*3/uL (ref 145–400)
RBC: 4.16 10*6/uL (ref 3.70–5.32)
RDW: 12.7 % (ref 11.1–15.7)
WBC: 6.2 10*3/uL (ref 3.9–10.0)

## 2016-10-01 LAB — CMP (CANCER CENTER ONLY)
ALBUMIN: 3.5 g/dL (ref 3.3–5.5)
ALK PHOS: 88 U/L — AB (ref 26–84)
ALT: 37 U/L (ref 10–47)
AST: 34 U/L (ref 11–38)
BUN: 12 mg/dL (ref 7–22)
CHLORIDE: 105 meq/L (ref 98–108)
CO2: 26 mEq/L (ref 18–33)
CREATININE: 0.6 mg/dL (ref 0.6–1.2)
Calcium: 9.5 mg/dL (ref 8.0–10.3)
Glucose, Bld: 86 mg/dL (ref 73–118)
Potassium: 4 mEq/L (ref 3.3–4.7)
SODIUM: 142 meq/L (ref 128–145)
TOTAL PROTEIN: 7 g/dL (ref 6.4–8.1)
Total Bilirubin: 0.8 mg/dl (ref 0.20–1.60)

## 2016-10-01 LAB — CEA (IN HOUSE-CHCC): CEA (CHCC-IN HOUSE): 1.9 ng/mL (ref 0.00–5.00)

## 2016-10-01 MED ORDER — SODIUM CHLORIDE 0.9 % IV SOLN
2560.0000 mg/m2 | INTRAVENOUS | Status: DC
  Administered 2016-10-01: 4550 mg via INTRAVENOUS
  Filled 2016-10-01: qty 91

## 2016-10-01 MED ORDER — ALTEPLASE 2 MG IJ SOLR
2.0000 mg | Freq: Once | INTRAMUSCULAR | Status: DC | PRN
  Filled 2016-10-01: qty 2

## 2016-10-01 MED ORDER — PALONOSETRON HCL INJECTION 0.25 MG/5ML
INTRAVENOUS | Status: AC
  Filled 2016-10-01: qty 5

## 2016-10-01 MED ORDER — IRINOTECAN HCL CHEMO INJECTION 100 MG/5ML
132.0000 mg/m2 | Freq: Once | INTRAVENOUS | Status: AC
  Administered 2016-10-01: 240 mg via INTRAVENOUS
  Filled 2016-10-01: qty 10

## 2016-10-01 MED ORDER — OXALIPLATIN CHEMO INJECTION 100 MG/20ML
68.0000 mg/m2 | Freq: Once | INTRAVENOUS | Status: AC
  Administered 2016-10-01: 120 mg via INTRAVENOUS
  Filled 2016-10-01: qty 20

## 2016-10-01 MED ORDER — ATROPINE SULFATE 1 MG/ML IJ SOLN: 0.5000 mg | Freq: Once | INTRAMUSCULAR | Status: DC | PRN

## 2016-10-01 MED ORDER — HEPARIN SOD (PORK) LOCK FLUSH 100 UNIT/ML IV SOLN
250.0000 [IU] | Freq: Once | INTRAVENOUS | Status: DC | PRN
  Filled 2016-10-01: qty 5

## 2016-10-01 MED ORDER — PALONOSETRON HCL INJECTION 0.25 MG/5ML
0.2500 mg | Freq: Once | INTRAVENOUS | Status: AC
Start: 2016-10-01 — End: 2016-10-01
  Administered 2016-10-01: 0.25 mg via INTRAVENOUS

## 2016-10-01 MED ORDER — LEUCOVORIN CALCIUM INJECTION 350 MG
197.0000 mg/m2 | Freq: Once | INTRAVENOUS | Status: AC
  Administered 2016-10-01: 350 mg via INTRAVENOUS
  Filled 2016-10-01: qty 17.5

## 2016-10-01 MED ORDER — DEXAMETHASONE SODIUM PHOSPHATE 10 MG/ML IJ SOLN
INTRAMUSCULAR | Status: AC
  Filled 2016-10-01: qty 1

## 2016-10-01 MED ORDER — DEXAMETHASONE SODIUM PHOSPHATE 10 MG/ML IJ SOLN
10.0000 mg | Freq: Once | INTRAMUSCULAR | Status: AC
  Administered 2016-10-01: 10 mg via INTRAVENOUS

## 2016-10-01 MED ORDER — HEPARIN SOD (PORK) LOCK FLUSH 100 UNIT/ML IV SOLN
500.0000 [IU] | Freq: Once | INTRAVENOUS | Status: DC | PRN
  Filled 2016-10-01: qty 5

## 2016-10-01 MED ORDER — SODIUM CHLORIDE 0.9% FLUSH
3.0000 mL | INTRAVENOUS | Status: DC | PRN
  Filled 2016-10-01: qty 10

## 2016-10-01 MED ORDER — SODIUM CHLORIDE 0.9% FLUSH
10.0000 mL | INTRAVENOUS | Status: DC | PRN
  Filled 2016-10-01: qty 10

## 2016-10-01 MED ORDER — DEXTROSE 5 % IV SOLN
Freq: Once | INTRAVENOUS | Status: AC
  Administered 2016-10-01: 11:00:00 via INTRAVENOUS

## 2016-10-01 NOTE — Patient Instructions (Signed)
Fluorouracil, 5-FU injection What is this medicine? FLUOROURACIL, 5-FU (flure oh YOOR a sil) is a chemotherapy drug. It slows the growth of cancer cells. This medicine is used to treat many types of cancer like breast cancer, colon or rectal cancer, pancreatic cancer, and stomach cancer. This medicine may be used for other purposes; ask your health care provider or pharmacist if you have questions. COMMON BRAND NAME(S): Adrucil What should I tell my health care provider before I take this medicine? They need to know if you have any of these conditions: -blood disorders -dihydropyrimidine dehydrogenase (DPD) deficiency -infection (especially a virus infection such as chickenpox, cold sores, or herpes) -kidney disease -liver disease -malnourished, poor nutrition -recent or ongoing radiation therapy -an unusual or allergic reaction to fluorouracil, other chemotherapy, other medicines, foods, dyes, or preservatives -pregnant or trying to get pregnant -breast-feeding How should I use this medicine? This drug is given as an infusion or injection into a vein. It is administered in a hospital or clinic by a specially trained health care professional. Talk to your pediatrician regarding the use of this medicine in children. Special care may be needed. Overdosage: If you think you have taken too much of this medicine contact a poison control center or emergency room at once. NOTE: This medicine is only for you. Do not share this medicine with others. What if I miss a dose? It is important not to miss your dose. Call your doctor or health care professional if you are unable to keep an appointment. What may interact with this medicine? -allopurinol -cimetidine -dapsone -digoxin -hydroxyurea -leucovorin -levamisole -medicines for seizures like ethotoin, fosphenytoin, phenytoin -medicines to increase blood counts like filgrastim, pegfilgrastim, sargramostim -medicines that treat or prevent blood  clots like warfarin, enoxaparin, and dalteparin -methotrexate -metronidazole -pyrimethamine -some other chemotherapy drugs like busulfan, cisplatin, estramustine, vinblastine -trimethoprim -trimetrexate -vaccines Talk to your doctor or health care professional before taking any of these medicines: -acetaminophen -aspirin -ibuprofen -ketoprofen -naproxen This list may not describe all possible interactions. Give your health care provider a list of all the medicines, herbs, non-prescription drugs, or dietary supplements you use. Also tell them if you smoke, drink alcohol, or use illegal drugs. Some items may interact with your medicine. What should I watch for while using this medicine? Visit your doctor for checks on your progress. This drug may make you feel generally unwell. This is not uncommon, as chemotherapy can affect healthy cells as well as cancer cells. Report any side effects. Continue your course of treatment even though you feel ill unless your doctor tells you to stop. In some cases, you may be given additional medicines to help with side effects. Follow all directions for their use. Call your doctor or health care professional for advice if you get a fever, chills or sore throat, or other symptoms of a cold or flu. Do not treat yourself. This drug decreases your body's ability to fight infections. Try to avoid being around people who are sick. This medicine may increase your risk to bruise or bleed. Call your doctor or health care professional if you notice any unusual bleeding. Be careful brushing and flossing your teeth or using a toothpick because you may get an infection or bleed more easily. If you have any dental work done, tell your dentist you are receiving this medicine. Avoid taking products that contain aspirin, acetaminophen, ibuprofen, naproxen, or ketoprofen unless instructed by your doctor. These medicines may hide a fever. Do not become pregnant while taking this  medicine. Women should inform their doctor if they wish to become pregnant or think they might be pregnant. There is a potential for serious side effects to an unborn child. Talk to your health care professional or pharmacist for more information. Do not breast-feed an infant while taking this medicine. Men should inform their doctor if they wish to father a child. This medicine may lower sperm counts. Do not treat diarrhea with over the counter products. Contact your doctor if you have diarrhea that lasts more than 2 days or if it is severe and watery. This medicine can make you more sensitive to the sun. Keep out of the sun. If you cannot avoid being in the sun, wear protective clothing and use sunscreen. Do not use sun lamps or tanning beds/booths. What side effects may I notice from receiving this medicine? Side effects that you should report to your doctor or health care professional as soon as possible: -allergic reactions like skin rash, itching or hives, swelling of the face, lips, or tongue -low blood counts - this medicine may decrease the number of white blood cells, red blood cells and platelets. You may be at increased risk for infections and bleeding. -signs of infection - fever or chills, cough, sore throat, pain or difficulty passing urine -signs of decreased platelets or bleeding - bruising, pinpoint red spots on the skin, black, tarry stools, blood in the urine -signs of decreased red blood cells - unusually weak or tired, fainting spells, lightheadedness -breathing problems -changes in vision -chest pain -mouth sores -nausea and vomiting -pain, swelling, redness at site where injected -pain, tingling, numbness in the hands or feet -redness, swelling, or sores on hands or feet -stomach pain -unusual bleeding Side effects that usually do not require medical attention (report to your doctor or health care professional if they continue or are bothersome): -changes in finger or  toe nails -diarrhea -dry or itchy skin -hair loss -headache -loss of appetite -sensitivity of eyes to the light -stomach upset -unusually teary eyes This list may not describe all possible side effects. Call your doctor for medical advice about side effects. You may report side effects to FDA at 1-800-FDA-1088. Where should I keep my medicine? This drug is given in a hospital or clinic and will not be stored at home. NOTE: This sheet is a summary. It may not cover all possible information. If you have questions about this medicine, talk to your doctor, pharmacist, or health care provider.  2018 [employer] Standard (2007-07-15 13:53:16) Leucovorin injection What is this medicine? LEUCOVORIN (loo koe VOR in) is used to prevent or treat the harmful effects of some medicines. This medicine is used to treat anemia caused by a low amount of folic acid in the body. It is also used with 5-fluorouracil (5-FU) to treat colon cancer. This medicine may be used for other purposes; ask your health care provider or pharmacist if you have questions. What should I tell my health care provider before I take this medicine? They need to know if you have any of these conditions: -anemia from low levels of vitamin B-12 in the blood -an unusual or allergic reaction to leucovorin, folic acid, other medicines, foods, dyes, or preservatives -pregnant or trying to get pregnant -breast-feeding How should I use this medicine? This medicine is for injection into a muscle or into a vein. It is given by a health care professional in a hospital or clinic setting. Talk to your pediatrician regarding the use of this medicine in   children. Special care may be needed. Overdosage: If you think you have taken too much of this medicine contact a poison control center or emergency room at once. NOTE: This medicine is only for you. Do not share this medicine with others. What if I miss a dose? This does not apply. What may  interact with this medicine? -capecitabine -fluorouracil -phenobarbital -phenytoin -primidone -trimethoprim-sulfamethoxazole This list may not describe all possible interactions. Give your health care provider a list of all the medicines, herbs, non-prescription drugs, or dietary supplements you use. Also tell them if you smoke, drink alcohol, or use illegal drugs. Some items may interact with your medicine. What should I watch for while using this medicine? Your condition will be monitored carefully while you are receiving this medicine. This medicine may increase the side effects of 5-fluorouracil, 5-FU. Tell your doctor or health care professional if you have diarrhea or mouth sores that do not get better or that get worse. What side effects may I notice from receiving this medicine? Side effects that you should report to your doctor or health care professional as soon as possible: -allergic reactions like skin rash, itching or hives, swelling of the face, lips, or tongue -breathing problems -fever, infection -mouth sores -unusual bleeding or bruising -unusually weak or tired Side effects that usually do not require medical attention (report to your doctor or health care professional if they continue or are bothersome): -constipation or diarrhea -loss of appetite -nausea, vomiting This list may not describe all possible side effects. Call your doctor for medical advice about side effects. You may report side effects to FDA at 1-800-FDA-1088. Where should I keep my medicine? This drug is given in a hospital or clinic and will not be stored at home. NOTE: This sheet is a summary. It may not cover all possible information. If you have questions about this medicine, talk to your doctor, pharmacist, or health care provider.  2018 [employer] Standard (2007-09-15 16:50:29) Irinotecan injection What is this medicine? IRINOTECAN (ir in oh TEE kan ) is a chemotherapy drug. It is used to  treat colon and rectal cancer. This medicine may be used for other purposes; ask your health care provider or pharmacist if you have questions. COMMON BRAND NAME(S): Camptosar What should I tell my health care provider before I take this medicine? They need to know if you have any of these conditions: -blood disorders -dehydration -diarrhea -infection (especially a virus infection such as chickenpox, cold sores, or herpes) -liver disease -low blood counts, like low white cell, platelet, or red cell counts -recent or ongoing radiation therapy -an unusual or allergic reaction to irinotecan, sorbitol, other chemotherapy, other medicines, foods, dyes, or preservatives -pregnant or trying to get pregnant -breast-feeding How should I use this medicine? This drug is given as an infusion into a vein. It is administered in a hospital or clinic by a specially trained health care professional. Talk to your pediatrician regarding the use of this medicine in children. Special care may be needed. Overdosage: If you think you have taken too much of this medicine contact a poison control center or emergency room at once. NOTE: This medicine is only for you. Do not share this medicine with others. What if I miss a dose? It is important not to miss your dose. Call your doctor or health care professional if you are unable to keep an appointment. What may interact with this medicine? Do not take this medicine with any of the following medications: -atazanavir -certain   medicines for fungal infections like itraconazole and ketoconazole -St. John's Wort This medicine may also interact with the following medications: -dexamethasone -diuretics -laxatives -medicines for seizures like carbamazepine, mephobarbital, phenobarbital, phenytoin, primidone -medicines to increase blood counts like filgrastim, pegfilgrastim, sargramostim -prochlorperazine -vaccines This list may not describe all possible  interactions. Give your health care provider a list of all the medicines, herbs, non-prescription drugs, or dietary supplements you use. Also tell them if you smoke, drink alcohol, or use illegal drugs. Some items may interact with your medicine. What should I watch for while using this medicine? Your condition will be monitored carefully while you are receiving this medicine. You will need important blood work done while you are taking this medicine. This drug may make you feel generally unwell. This is not uncommon, as chemotherapy can affect healthy cells as well as cancer cells. Report any side effects. Continue your course of treatment even though you feel ill unless your doctor tells you to stop. In some cases, you may be given additional medicines to help with side effects. Follow all directions for their use. You may get drowsy or dizzy. Do not drive, use machinery, or do anything that needs mental alertness until you know how this medicine affects you. Do not stand or sit up quickly, especially if you are an older patient. This reduces the risk of dizzy or fainting spells. Call your doctor or health care professional for advice if you get a fever, chills or sore throat, or other symptoms of a cold or flu. Do not treat yourself. This drug decreases your body's ability to fight infections. Try to avoid being around people who are sick. This medicine may increase your risk to bruise or bleed. Call your doctor or health care professional if you notice any unusual bleeding. Be careful brushing and flossing your teeth or using a toothpick because you may get an infection or bleed more easily. If you have any dental work done, tell your dentist you are receiving this medicine. Avoid taking products that contain aspirin, acetaminophen, ibuprofen, naproxen, or ketoprofen unless instructed by your doctor. These medicines may hide a fever. Do not become pregnant while taking this medicine. Women should  inform their doctor if they wish to become pregnant or think they might be pregnant. There is a potential for serious side effects to an unborn child. Talk to your health care professional or pharmacist for more information. Do not breast-feed an infant while taking this medicine. What side effects may I notice from receiving this medicine? Side effects that you should report to your doctor or health care professional as soon as possible: -allergic reactions like skin rash, itching or hives, swelling of the face, lips, or tongue -low blood counts - this medicine may decrease the number of white blood cells, red blood cells and platelets. You may be at increased risk for infections and bleeding. -signs of infection - fever or chills, cough, sore throat, pain or difficulty passing urine -signs of decreased platelets or bleeding - bruising, pinpoint red spots on the skin, black, tarry stools, blood in the urine -signs of decreased red blood cells - unusually weak or tired, fainting spells, lightheadedness -breathing problems -chest pain -diarrhea -feeling faint or lightheaded, falls -flushing, runny nose, sweating during infusion -mouth sores or pain -pain, swelling, redness or irritation where injected -pain, swelling, warmth in the leg -pain, tingling, numbness in the hands or feet -problems with balance, talking, walking -stomach cramps, pain -trouble passing urine or change in   the amount of urine -vomiting as to be unable to hold down drinks or food -yellowing of the eyes or skin Side effects that usually do not require medical attention (report to your doctor or health care professional if they continue or are bothersome): -constipation -hair loss -headache -loss of appetite -nausea, vomiting -stomach upset This list may not describe all possible side effects. Call your doctor for medical advice about side effects. You may report side effects to FDA at 1-800-FDA-1088. Where should I  keep my medicine? This drug is given in a hospital or clinic and will not be stored at home. NOTE: This sheet is a summary. It may not cover all possible information. If you have questions about this medicine, talk to your doctor, pharmacist, or health care provider.  2018 [employer] Standard (2012-09-07 16:29:32) Oxaliplatin Injection What is this medicine? OXALIPLATIN (ox AL i PLA tin) is a chemotherapy drug. It targets fast dividing cells, like cancer cells, and causes these cells to die. This medicine is used to treat cancers of the colon and rectum, and many other cancers. This medicine may be used for other purposes; ask your health care provider or pharmacist if you have questions. COMMON BRAND NAME(S): Eloxatin What should I tell my health care provider before I take this medicine? They need to know if you have any of these conditions: -kidney disease -an unusual or allergic reaction to oxaliplatin, other chemotherapy, other medicines, foods, dyes, or preservatives -pregnant or trying to get pregnant -breast-feeding How should I use this medicine? This drug is given as an infusion into a vein. It is administered in a hospital or clinic by a specially trained health care professional. Talk to your pediatrician regarding the use of this medicine in children. Special care may be needed. Overdosage: If you think you have taken too much of this medicine contact a poison control center or emergency room at once. NOTE: This medicine is only for you. Do not share this medicine with others. What if I miss a dose? It is important not to miss a dose. Call your doctor or health care professional if you are unable to keep an appointment. What may interact with this medicine? -medicines to increase blood counts like filgrastim, pegfilgrastim, sargramostim -probenecid -some antibiotics like amikacin, gentamicin, neomycin, polymyxin B, streptomycin, tobramycin -zalcitabine Talk to your doctor  or health care professional before taking any of these medicines: -acetaminophen -aspirin -ibuprofen -ketoprofen -naproxen This list may not describe all possible interactions. Give your health care provider a list of all the medicines, herbs, non-prescription drugs, or dietary supplements you use. Also tell them if you smoke, drink alcohol, or use illegal drugs. Some items may interact with your medicine. What should I watch for while using this medicine? Your condition will be monitored carefully while you are receiving this medicine. You will need important blood work done while you are taking this medicine. This medicine can make you more sensitive to cold. Do not drink cold drinks or use ice. Cover exposed skin before coming in contact with cold temperatures or cold objects. When out in cold weather wear warm clothing and cover your mouth and nose to warm the air that goes into your lungs. Tell your doctor if you get sensitive to the cold. This drug may make you feel generally unwell. This is not uncommon, as chemotherapy can affect healthy cells as well as cancer cells. Report any side effects. Continue your course of treatment even though you feel ill unless your doctor  tells you to stop. In some cases, you may be given additional medicines to help with side effects. Follow all directions for their use. Call your doctor or health care professional for advice if you get a fever, chills or sore throat, or other symptoms of a cold or flu. Do not treat yourself. This drug decreases your body's ability to fight infections. Try to avoid being around people who are sick. This medicine may increase your risk to bruise or bleed. Call your doctor or health care professional if you notice any unusual bleeding. Be careful brushing and flossing your teeth or using a toothpick because you may get an infection or bleed more easily. If you have any dental work done, tell your dentist you are receiving this  medicine. Avoid taking products that contain aspirin, acetaminophen, ibuprofen, naproxen, or ketoprofen unless instructed by your doctor. These medicines may hide a fever. Do not become pregnant while taking this medicine. Women should inform their doctor if they wish to become pregnant or think they might be pregnant. There is a potential for serious side effects to an unborn child. Talk to your health care professional or pharmacist for more information. Do not breast-feed an infant while taking this medicine. Call your doctor or health care professional if you get diarrhea. Do not treat yourself. What side effects may I notice from receiving this medicine? Side effects that you should report to your doctor or health care professional as soon as possible: -allergic reactions like skin rash, itching or hives, swelling of the face, lips, or tongue -low blood counts - This drug may decrease the number of white blood cells, red blood cells and platelets. You may be at increased risk for infections and bleeding. -signs of infection - fever or chills, cough, sore throat, pain or difficulty passing urine -signs of decreased platelets or bleeding - bruising, pinpoint red spots on the skin, black, tarry stools, nosebleeds -signs of decreased red blood cells - unusually weak or tired, fainting spells, lightheadedness -breathing problems -chest pain, pressure -cough -diarrhea -jaw tightness -mouth sores -nausea and vomiting -pain, swelling, redness or irritation at the injection site -pain, tingling, numbness in the hands or feet -problems with balance, talking, walking -redness, blistering, peeling or loosening of the skin, including inside the mouth -trouble passing urine or change in the amount of urine Side effects that usually do not require medical attention (report to your doctor or health care professional if they continue or are bothersome): -changes in vision -constipation -hair  loss -loss of appetite -metallic taste in the mouth or changes in taste -stomach pain This list may not describe all possible side effects. Call your doctor for medical advice about side effects. You may report side effects to FDA at 1-800-FDA-1088. Where should I keep my medicine? This drug is given in a hospital or clinic and will not be stored at home. NOTE: This sheet is a summary. It may not cover all possible information. If you have questions about this medicine, talk to your doctor, pharmacist, or health care provider.  2018 [employer] Standard (2007-10-06 17:22:47)

## 2016-10-01 NOTE — Patient Instructions (Signed)
Implanted Port Home Guide An implanted port is a type of central line that is placed under the skin. Central lines are used to provide IV access when treatment or nutrition needs to be given through a person's veins. Implanted ports are used for long-term IV access. An implanted port may be placed because:  You need IV medicine that would be irritating to the small veins in your hands or arms.  You need long-term IV medicines, such as antibiotics.  You need IV nutrition for a long period.  You need frequent blood draws for lab tests.  You need dialysis.  Implanted ports are usually placed in the chest area, but they can also be placed in the upper arm, the abdomen, or the leg. An implanted port has two main parts:  Reservoir. The reservoir is round and will appear as a small, raised area under your skin. The reservoir is the part where a needle is inserted to give medicines or draw blood.  Catheter. The catheter is a thin, flexible tube that extends from the reservoir. The catheter is placed into a large vein. Medicine that is inserted into the reservoir goes into the catheter and then into the vein.  How will I care for my incision site? Do not get the incision site wet. Bathe or shower as directed by your health care provider. How is my port accessed? Special steps must be taken to access the port:  Before the port is accessed, a numbing cream can be placed on the skin. This helps numb the skin over the port site.  Your health care provider uses a sterile technique to access the port. ? Your health care provider must put on a mask and sterile gloves. ? The skin over your port is cleaned carefully with an antiseptic and allowed to dry. ? The port is gently pinched between sterile gloves, and a needle is inserted into the port.  Only "non-coring" port needles should be used to access the port. Once the port is accessed, a blood return should be checked. This helps ensure that the port  is in the vein and is not clogged.  If your port needs to remain accessed for a constant infusion, a clear (transparent) bandage will be placed over the needle site. The bandage and needle will need to be changed every week, or as directed by your health care provider.  Keep the bandage covering the needle clean and dry. Do not get it wet. Follow your health care provider's instructions on how to take a shower or bath while the port is accessed.  If your port does not need to stay accessed, no bandage is needed over the port.  What is flushing? Flushing helps keep the port from getting clogged. Follow your health care provider's instructions on how and when to flush the port. Ports are usually flushed with saline solution or a medicine called heparin. The need for flushing will depend on how the port is used.  If the port is used for intermittent medicines or blood draws, the port will need to be flushed: ? After medicines have been given. ? After blood has been drawn. ? As part of routine maintenance.  If a constant infusion is running, the port may not need to be flushed.  How long will my port stay implanted? The port can stay in for as long as your health care provider thinks it is needed. When it is time for the port to come out, surgery will be   done to remove it. The procedure is similar to the one performed when the port was put in. When should I seek immediate medical care? When you have an implanted port, you should seek immediate medical care if:  You notice a bad smell coming from the incision site.  You have swelling, redness, or drainage at the incision site.  You have more swelling or pain at the port site or the surrounding area.  You have a fever that is not controlled with medicine.  This information is not intended to replace advice given to you by your health care provider. Make sure you discuss any questions you have with your health care provider. Document  Released: 03/11/2005 Document Revised: 08/17/2015 Document Reviewed: 11/16/2012 Elsevier Interactive Patient Education  2017 [employer]  

## 2016-10-01 NOTE — Progress Notes (Signed)
Hematology and Oncology Follow Up Visit  [name] [id number] October 29, 1970 46 y.o. 10/01/2016   Principle Diagnosis:   Recurrent colon cancer - HER2 (-)Carla Mills stable/MMR normal/BRAF -  Current Therapy:     FOLFOXIRI on 04/09/2016 - s/p cycle #7  Status post exploratory laparotomy with HIPEC - 07/29/2016     Interim History:  [name] is back for follow-up. She still looks incredibly healthy. She, in all honesty, has really not had a problem with surgery. There's been no nausea or vomiting. There's been no abdominal pain. She has some discomfort over on the right side of the abdomen. She says this causes some discomfort when she takes a deep breath in. She has no obvious shortness of breath.  She saw her surgical oncologist last week. He was very pleased with her progress. He feels that now is the time that she can finish up her systemic chemotherapy.  We have been watching her liver function test. They have normalized nicely. Her alkaline phosphatase is still up a little bit.  She's had no change in bowel or bladder habits. She's had no bleeding.  Her appetite is great.   She and her family had a great time in [place]. They're down for a basketball tournament for one of their daughters.  Overall, her performance status is ECOG 0.  Medications:  Current Outpatient Prescriptions:  .  acetaminophen (TYLENOL) 500 MG tablet, Take 1,000 mg by mouth every 8 (eight) hours as needed for moderate pain. , Disp: , Rfl:  .  dexamethasone (DECADRON) 4 MG tablet, Take 2 tablets (8 mg total) by mouth daily. Start the day after chemo and take for 2 days., Disp: 24 tablet, Rfl: 0 .  fexofenadine (ALLEGRA) 180 MG tablet, Take 180 mg by mouth daily as needed for allergies. , Disp: , Rfl:  .  fluticasone (FLONASE) 50 MCG/ACT nasal spray, Place 1 spray into both nostrils 2 (two) times daily. , Disp: , Rfl:  .  HYDROcodone-acetaminophen (NORCO/VICODIN) 5-325 MG tablet, Take 1-2 tablets by mouth  every 6 (six) hours as needed., Disp: 15 tablet, Rfl: 0 .  ibuprofen (ADVIL,MOTRIN) 200 MG tablet, Take 400 mg by mouth every 6 (six) hours as needed for headache or mild pain., Disp: , Rfl:  .  lidocaine-prilocaine (EMLA) cream, Apply 1 application topically as needed. Apply to [facility] 1 hour prior to treatment., Disp: 30 g, Rfl: 3 .  loperamide (IMODIUM) 2 MG capsule, Take 1 capsule (2 mg total) by mouth as needed for diarrhea or loose stools. Take 2 capsules at the onset of diarrhea, then 1 every 2 hours until 12 hours with no BM. May take 2 every 4 hours at night. If diarrhea recurrs, repeat., Disp: 100 capsule, Rfl: 0 .  LORazepam (ATIVAN) 1 MG tablet, Take 1 tablet (1 mg total) by mouth every 6 (six) hours as needed for anxiety. Prn nausea/vomiting, Disp: 30 tablet, Rfl: 0 .  magic mouthwash w/lidocaine SOLN, Take 5 mLs by mouth 3 (three) times daily as needed for mouth pain., Disp: 120 mL, Rfl: 1 .  neomycin (MYCIFRADIN) 500 MG tablet, Take two tablets at 1:00pm, 2:00pm and 11:00pm the night before surgery., Disp: , Rfl:  .  nystatin (MYCOSTATIN) 100000 UNIT/ML suspension, 100,000 Units., Disp: , Rfl: 0 .  ondansetron (ZOFRAN) 8 MG tablet, Take 1 tablet (8 mg total) by mouth every 8 (eight) hours as needed for nausea or vomiting. Start Day 3 after chemo., Disp: 20 tablet, Rfl: 0 .  prochlorperazine (COMPAZINE) 10  MG tablet, Take 1 tablet (10 mg total) by mouth every 6 (six) hours as needed for nausea or vomiting., Disp: 30 tablet, Rfl: 1 .  promethazine (PHENERGAN) 25 MG tablet, Take 1 tablet (25 mg total) by mouth every 6 (six) hours as needed for nausea or vomiting., Disp: 12 tablet, Rfl: 0 .  traMADol (ULTRAM) 50 MG tablet, Take 1 tablet (50 mg total) by mouth every 6 (six) hours as needed. (Patient taking differently: Take 50 mg by mouth every 6 (six) hours as needed for moderate pain. ), Disp: 90 tablet, Rfl: 0 No current facility-administered medications for this visit.   Facility-Administered  Medications Ordered in Other Visits:  .  clindamycin (CLEOCIN) 900 mg in dextrose 5 % 50 mL IVPB, 900 mg, Intravenous, 60 min Pre-Op **AND** gentamicin (GARAMYCIN) 340 mg in dextrose 5 % 50 mL IVPB, 5 mg/kg, Intravenous, 60 min Pre-Op, [name], MD .  dextrose 5 % solution, , Intravenous, Continuous, Dalesha Stanback, Rudell Cobb, MD, Stopped at 04/09/16 1418 .  heparin lock flush 100 unit/mL, 500 Units, Intracatheter, Once PRN, Volanda Napoleon, MD .  sodium chloride flush (NS) 0.9 % injection 10 mL, 10 mL, Intracatheter, PRN, Volanda Napoleon, MD  Allergies:  Allergies  Allergen Reactions  . Bactrim [Sulfamethoxazole-Trimethoprim]   . Oxaprozin Hives    REACTION: Hives  . Penicillins Hives    REACTION: Hives Has patient had a PCN reaction causing immediate rash, facial/tongue/throat swelling, SOB or lightheadedness with hypotension: no Has patient had a PCN reaction causing severe rash involving mucus membranes or skin necrosis: {no Has patient had a PCN reaction that required hospitalization no Has patient had a PCN reaction occurring within the last 10 years: no If all of the above answers are "NO", then may proceed with Cephalosporin use.  . Sulfonamide Derivatives Swelling    Massive extremity swelling     Past Medical History, Surgical history, Social history, and Family History were reviewed and updated.  Review of Systems:  As above  Physical Exam:  weight is 165 lb (74.8 kg). Her oral temperature is 97.6 F (36.4 C). Her blood pressure is 124/72 and her pulse is 73. Her respiration is 18 and oxygen saturation is 100%.   Wt Readings from Last 3 Encounters:  10/01/16 165 lb (74.8 kg)  09/17/16 164 lb (74.4 kg)  08/27/16 161 lb (73 kg)      Well-developed and well-nourished white female in no obvious distress. Head and neck exam shows no ocular or oral lesions. She has no palpable cervical or supraclavicular lymph nodes. Lungs are clear. Cardiac exam regular rate and rhythm  with no murmurs, rubs or bruits. Abdomen is soft. She has a dressing over the laparotomy wound. There is no fluid wave. She has no guarding or rebound tenderness. She has no liver or spleen tip. Her laparoscopy scars are healing.  Back exam shows no tenderness over the spine, ribs or hips. Extremities shows no clubbing, cyanosis or edema. She's good range motion of her joints. Skin exam shows no rashes, ecchymoses or petechia. Neurological exam shows no focal neurological deficits.  Lab Results  Component Value Date   WBC 6.2 10/01/2016   HGB 12.7 10/01/2016   HCT 37.4 10/01/2016   MCV 90 10/01/2016   PLT 262 10/01/2016     Chemistry      Component Value Date/Time   NA 142 10/01/2016 0748   NA 141 07/30/2016 0750   K 4.0 10/01/2016 0748   K 4.2 07/30/2016  0750   CL 105 10/01/2016 0748   CO2 26 10/01/2016 0748   CO2 26 07/30/2016 0750   BUN 12 10/01/2016 0748   BUN 12.1 07/30/2016 0750   CREATININE 0.6 10/01/2016 0748   CREATININE 0.7 07/30/2016 0750      Component Value Date/Time   CALCIUM 9.5 10/01/2016 0748   CALCIUM 9.5 07/30/2016 0750   ALKPHOS 88 (H) 10/01/2016 0748   ALKPHOS 72 07/30/2016 0750   AST 34 10/01/2016 0748   AST 22 07/30/2016 0750   ALT 37 10/01/2016 0748   ALT 22 07/30/2016 0750   BILITOT 0.80 10/01/2016 0748   BILITOT 0.54 07/30/2016 0750         Impression and Plan: [name] is a 46 year old white female. She initially presented with stage II colon cancer back in I think December 2016. She had a very high Oncotype score of 37. Because of this, we went ahead and gave her adjuvant chemotherapy with Xeloda. Despite this, her cancer has recurred.  I am incredibly pleased that she is done so well. Her response to chemotherapy has been fantastic.  We will need to continue her on systemic chemotherapy. We will go ahead and start today. This will be her 9th of 12 cycles.   We will plan to get her back in 2 more weeks for her 10th  cycle.  Volanda Napoleon, MD 7/10/20188:47 AM

## 2016-10-03 ENCOUNTER — Ambulatory Visit (HOSPITAL_BASED_OUTPATIENT_CLINIC_OR_DEPARTMENT_OTHER): Payer: BLUE CROSS/BLUE SHIELD

## 2016-10-03 VITALS — BP 109/65 | HR 75 | Temp 97.9°F | Resp 16

## 2016-10-03 DIAGNOSIS — C772 Secondary and unspecified malignant neoplasm of intra-abdominal lymph nodes: Secondary | ICD-10-CM

## 2016-10-03 DIAGNOSIS — C187 Malignant neoplasm of sigmoid colon: Secondary | ICD-10-CM

## 2016-10-03 MED ORDER — HEPARIN SOD (PORK) LOCK FLUSH 100 UNIT/ML IV SOLN
500.0000 [IU] | Freq: Once | INTRAVENOUS | Status: AC | PRN
Start: 2016-10-03 — End: 2016-10-03
  Administered 2016-10-03: 500 [IU]
  Filled 2016-10-03: qty 5

## 2016-10-03 MED ORDER — SODIUM CHLORIDE 0.9% FLUSH
10.0000 mL | INTRAVENOUS | Status: DC | PRN
  Administered 2016-10-03: 10 mL
  Filled 2016-10-03: qty 10

## 2016-10-03 NOTE — Patient Instructions (Signed)
Fluorouracil, 5-FU injection What is this medicine? FLUOROURACIL, 5-FU (flure oh YOOR a sil) is a chemotherapy drug. It slows the growth of cancer cells. This medicine is used to treat many types of cancer like breast cancer, colon or rectal cancer, pancreatic cancer, and stomach cancer. This medicine may be used for other purposes; ask your health care provider or pharmacist if you have questions. COMMON BRAND NAME(S): Adrucil What should I tell my health care provider before I take this medicine? They need to know if you have any of these conditions: -blood disorders -dihydropyrimidine dehydrogenase (DPD) deficiency -infection (especially a virus infection such as chickenpox, cold sores, or herpes) -kidney disease -liver disease -malnourished, poor nutrition -recent or ongoing radiation therapy -an unusual or allergic reaction to fluorouracil, other chemotherapy, other medicines, foods, dyes, or preservatives -pregnant or trying to get pregnant -breast-feeding How should I use this medicine? This drug is given as an infusion or injection into a vein. It is administered in a hospital or clinic by a specially trained health care professional. Talk to your pediatrician regarding the use of this medicine in children. Special care may be needed. Overdosage: If you think you have taken too much of this medicine contact a poison control center or emergency room at once. NOTE: This medicine is only for you. Do not share this medicine with others. What if I miss a dose? It is important not to miss your dose. Call your doctor or health care professional if you are unable to keep an appointment. What may interact with this medicine? -allopurinol -cimetidine -dapsone -digoxin -hydroxyurea -leucovorin -levamisole -medicines for seizures like ethotoin, fosphenytoin, phenytoin -medicines to increase blood counts like filgrastim, pegfilgrastim, sargramostim -medicines that treat or prevent blood  clots like warfarin, enoxaparin, and dalteparin -methotrexate -metronidazole -pyrimethamine -some other chemotherapy drugs like busulfan, cisplatin, estramustine, vinblastine -trimethoprim -trimetrexate -vaccines Talk to your doctor or health care professional before taking any of these medicines: -acetaminophen -aspirin -ibuprofen -ketoprofen -naproxen This list may not describe all possible interactions. Give your health care provider a list of all the medicines, herbs, non-prescription drugs, or dietary supplements you use. Also tell them if you smoke, drink alcohol, or use illegal drugs. Some items may interact with your medicine. What should I watch for while using this medicine? Visit your doctor for checks on your progress. This drug may make you feel generally unwell. This is not uncommon, as chemotherapy can affect healthy cells as well as cancer cells. Report any side effects. Continue your course of treatment even though you feel ill unless your doctor tells you to stop. In some cases, you may be given additional medicines to help with side effects. Follow all directions for their use. Call your doctor or health care professional for advice if you get a fever, chills or sore throat, or other symptoms of a cold or flu. Do not treat yourself. This drug decreases your body's ability to fight infections. Try to avoid being around people who are sick. This medicine may increase your risk to bruise or bleed. Call your doctor or health care professional if you notice any unusual bleeding. Be careful brushing and flossing your teeth or using a toothpick because you may get an infection or bleed more easily. If you have any dental work done, tell your dentist you are receiving this medicine. Avoid taking products that contain aspirin, acetaminophen, ibuprofen, naproxen, or ketoprofen unless instructed by your doctor. These medicines may hide a fever. Do not become pregnant while taking this    medicine. Women should inform their doctor if they wish to become pregnant or think they might be pregnant. There is a potential for serious side effects to an unborn child. Talk to your health care professional or pharmacist for more information. Do not breast-feed an infant while taking this medicine. Men should inform their doctor if they wish to father a child. This medicine may lower sperm counts. Do not treat diarrhea with over the counter products. Contact your doctor if you have diarrhea that lasts more than 2 days or if it is severe and watery. This medicine can make you more sensitive to the sun. Keep out of the sun. If you cannot avoid being in the sun, wear protective clothing and use sunscreen. Do not use sun lamps or tanning beds/booths. What side effects may I notice from receiving this medicine? Side effects that you should report to your doctor or health care professional as soon as possible: -allergic reactions like skin rash, itching or hives, swelling of the face, lips, or tongue -low blood counts - this medicine may decrease the number of white blood cells, red blood cells and platelets. You may be at increased risk for infections and bleeding. -signs of infection - fever or chills, cough, sore throat, pain or difficulty passing urine -signs of decreased platelets or bleeding - bruising, pinpoint red spots on the skin, black, tarry stools, blood in the urine -signs of decreased red blood cells - unusually weak or tired, fainting spells, lightheadedness -breathing problems -changes in vision -chest pain -mouth sores -nausea and vomiting -pain, swelling, redness at site where injected -pain, tingling, numbness in the hands or feet -redness, swelling, or sores on hands or feet -stomach pain -unusual bleeding Side effects that usually do not require medical attention (report to your doctor or health care professional if they continue or are bothersome): -changes in finger or  toe nails -diarrhea -dry or itchy skin -hair loss -headache -loss of appetite -sensitivity of eyes to the light -stomach upset -unusually teary eyes This list may not describe all possible side effects. Call your doctor for medical advice about side effects. You may report side effects to FDA at 1-800-FDA-1088. Where should I keep my medicine? This drug is given in a hospital or clinic and will not be stored at home. NOTE: This sheet is a summary. It may not cover all possible information. If you have questions about this medicine, talk to your doctor, pharmacist, or health care provider.  2018 Elsevier/Gold Standard (2007-07-15 13:53:16)  

## 2016-10-10 DIAGNOSIS — Z1151 Encounter for screening for human papillomavirus (HPV): Secondary | ICD-10-CM | POA: Diagnosis not present

## 2016-10-10 DIAGNOSIS — Z01419 Encounter for gynecological examination (general) (routine) without abnormal findings: Secondary | ICD-10-CM | POA: Diagnosis not present

## 2016-10-10 DIAGNOSIS — Z780 Asymptomatic menopausal state: Secondary | ICD-10-CM | POA: Diagnosis not present

## 2016-10-15 ENCOUNTER — Other Ambulatory Visit (HOSPITAL_BASED_OUTPATIENT_CLINIC_OR_DEPARTMENT_OTHER): Payer: BLUE CROSS/BLUE SHIELD

## 2016-10-15 ENCOUNTER — Ambulatory Visit (HOSPITAL_BASED_OUTPATIENT_CLINIC_OR_DEPARTMENT_OTHER): Payer: BLUE CROSS/BLUE SHIELD

## 2016-10-15 ENCOUNTER — Ambulatory Visit: Payer: BLUE CROSS/BLUE SHIELD | Admitting: Family

## 2016-10-15 ENCOUNTER — Ambulatory Visit (HOSPITAL_BASED_OUTPATIENT_CLINIC_OR_DEPARTMENT_OTHER): Payer: BLUE CROSS/BLUE SHIELD | Admitting: Hematology & Oncology

## 2016-10-15 ENCOUNTER — Other Ambulatory Visit: Payer: BLUE CROSS/BLUE SHIELD

## 2016-10-15 ENCOUNTER — Ambulatory Visit: Payer: BLUE CROSS/BLUE SHIELD

## 2016-10-15 VITALS — BP 116/83 | HR 69 | Temp 97.6°F | Resp 18 | Wt 169.0 lb

## 2016-10-15 DIAGNOSIS — C772 Secondary and unspecified malignant neoplasm of intra-abdominal lymph nodes: Principal | ICD-10-CM

## 2016-10-15 DIAGNOSIS — C187 Malignant neoplasm of sigmoid colon: Secondary | ICD-10-CM

## 2016-10-15 DIAGNOSIS — Z5111 Encounter for antineoplastic chemotherapy: Secondary | ICD-10-CM | POA: Diagnosis not present

## 2016-10-15 DIAGNOSIS — C189 Malignant neoplasm of colon, unspecified: Secondary | ICD-10-CM | POA: Diagnosis not present

## 2016-10-15 LAB — CMP (CANCER CENTER ONLY)
ALBUMIN: 3.4 g/dL (ref 3.3–5.5)
ALT(SGPT): 54 U/L — ABNORMAL HIGH (ref 10–47)
AST: 42 U/L — ABNORMAL HIGH (ref 11–38)
Alkaline Phosphatase: 90 U/L — ABNORMAL HIGH (ref 26–84)
BILIRUBIN TOTAL: 0.7 mg/dL (ref 0.20–1.60)
BUN, Bld: 12 mg/dL (ref 7–22)
CHLORIDE: 107 meq/L (ref 98–108)
CO2: 28 mEq/L (ref 18–33)
CREATININE: 0.7 mg/dL (ref 0.6–1.2)
Calcium: 9.5 mg/dL (ref 8.0–10.3)
Glucose, Bld: 86 mg/dL (ref 73–118)
Potassium: 4.2 mEq/L (ref 3.3–4.7)
SODIUM: 142 meq/L (ref 128–145)
TOTAL PROTEIN: 6.8 g/dL (ref 6.4–8.1)

## 2016-10-15 LAB — CBC WITH DIFFERENTIAL (CANCER CENTER ONLY)
BASO#: 0 10*3/uL (ref 0.0–0.2)
BASO%: 0.2 % (ref 0.0–2.0)
EOS ABS: 0.1 10*3/uL (ref 0.0–0.5)
EOS%: 1.4 % (ref 0.0–7.0)
HCT: 37.4 % (ref 34.8–46.6)
HEMOGLOBIN: 12.6 g/dL (ref 11.6–15.9)
LYMPH#: 1.6 10*3/uL (ref 0.9–3.3)
LYMPH%: 27.7 % (ref 14.0–48.0)
MCH: 29.9 pg (ref 26.0–34.0)
MCHC: 33.7 g/dL (ref 32.0–36.0)
MCV: 89 fL (ref 81–101)
MONO#: 0.7 10*3/uL (ref 0.1–0.9)
MONO%: 11.8 % (ref 0.0–13.0)
NEUT%: 58.9 % (ref 39.6–80.0)
NEUTROS ABS: 3.4 10*3/uL (ref 1.5–6.5)
Platelets: 213 10*3/uL (ref 145–400)
RBC: 4.22 10*6/uL (ref 3.70–5.32)
RDW: 12.7 % (ref 11.1–15.7)
WBC: 5.8 10*3/uL (ref 3.9–10.0)

## 2016-10-15 LAB — CEA (IN HOUSE-CHCC): CEA (CHCC-IN HOUSE): 1.71 ng/mL (ref 0.00–5.00)

## 2016-10-15 MED ORDER — DEXTROSE 5 % IV SOLN
Freq: Once | INTRAVENOUS | Status: AC
  Administered 2016-10-15: 09:00:00 via INTRAVENOUS

## 2016-10-15 MED ORDER — SODIUM CHLORIDE 0.9 % IV SOLN
2560.0000 mg/m2 | INTRAVENOUS | Status: DC
  Administered 2016-10-15: 4550 mg via INTRAVENOUS
  Filled 2016-10-15: qty 91

## 2016-10-15 MED ORDER — PALONOSETRON HCL INJECTION 0.25 MG/5ML
0.2500 mg | Freq: Once | INTRAVENOUS | Status: AC
  Administered 2016-10-15: 0.25 mg via INTRAVENOUS

## 2016-10-15 MED ORDER — LEUCOVORIN CALCIUM INJECTION 350 MG
197.0000 mg/m2 | Freq: Once | INTRAVENOUS | Status: AC
  Administered 2016-10-15: 350 mg via INTRAVENOUS
  Filled 2016-10-15: qty 17.5

## 2016-10-15 MED ORDER — IRINOTECAN HCL CHEMO INJECTION 100 MG/5ML
132.0000 mg/m2 | Freq: Once | INTRAVENOUS | Status: AC
  Administered 2016-10-15: 240 mg via INTRAVENOUS
  Filled 2016-10-15: qty 10

## 2016-10-15 MED ORDER — OXALIPLATIN CHEMO INJECTION 100 MG/20ML
68.0000 mg/m2 | Freq: Once | INTRAVENOUS | Status: AC
  Administered 2016-10-15: 120 mg via INTRAVENOUS
  Filled 2016-10-15: qty 20

## 2016-10-15 MED ORDER — DEXAMETHASONE SODIUM PHOSPHATE 10 MG/ML IJ SOLN
10.0000 mg | Freq: Once | INTRAMUSCULAR | Status: AC
Start: 2016-10-15 — End: 2016-10-15
  Administered 2016-10-15: 10 mg via INTRAVENOUS

## 2016-10-15 MED ORDER — PALONOSETRON HCL INJECTION 0.25 MG/5ML
INTRAVENOUS | Status: AC
  Filled 2016-10-15: qty 5

## 2016-10-15 MED ORDER — DEXAMETHASONE SODIUM PHOSPHATE 10 MG/ML IJ SOLN
INTRAMUSCULAR | Status: AC
  Filled 2016-10-15: qty 1

## 2016-10-15 NOTE — Progress Notes (Signed)
Hematology and Oncology Follow Up Visit  Carla Mills [id number] [birth year] 46 y.o. 10/15/2016   Principle Diagnosis:   Recurrent colon cancer - HER2 (-)/MSI stable/MMR normal/BRAF -  Current Therapy:     FOLFOXIRI on 04/09/2016 - s/p cycle #9  Status post exploratory laparotomy with HIPEC - 07/29/2016     Interim History:  [name] is back for follow-up. She still looks incredibly healthy. She, in all honesty, has really not had a problem with surgery. There's been no nausea or vomiting. She does have some intermittent abdominal pain. This is over on the right side. It is  not related to eating. She has some discomfort over on the right side of the abdomen. She says this causes some discomfort when she takes a deep breath in. She has no obvious shortness of breath.  Her last CEA was 1.7.  She's had no change in bowel or bladder habits. She's had no bleeding.  Her appetite is great.   She is exercising every day. She walks quite a lot.  There is no cough. She's had no fever. There's been no bleeding. She's had no leg swelling.  Overall, her performance status is ECOG 0.  Medications:  Current Outpatient Prescriptions:  .  acetaminophen (TYLENOL) 500 MG tablet, Take 1,000 mg by mouth every 8 (eight) hours as needed for moderate pain. , Disp: , Rfl:  .  dexamethasone (DECADRON) 4 MG tablet, Take 2 tablets (8 mg total) by mouth daily. Start the day after chemo and take for 2 days., Disp: 24 tablet, Rfl: 0 .  fexofenadine (ALLEGRA) 180 MG tablet, Take 180 mg by mouth daily as needed for allergies. , Disp: , Rfl:  .  fluticasone (FLONASE) 50 MCG/ACT nasal spray, Place 1 spray into both nostrils 2 (two) times daily. , Disp: , Rfl:  .  HYDROcodone-acetaminophen (NORCO/VICODIN) 5-325 MG tablet, Take 1-2 tablets by mouth every 6 (six) hours as needed., Disp: 15 tablet, Rfl: 0 .  ibuprofen (ADVIL,MOTRIN) 200 MG tablet, Take 400 mg by mouth every 6 (six) hours as needed for headache  or mild pain., Disp: , Rfl:  .  lidocaine-prilocaine (EMLA) cream, Apply 1 application topically as needed. Apply to [facility] 1 hour prior to treatment., Disp: 30 g, Rfl: 3 .  loperamide (IMODIUM) 2 MG capsule, Take 1 capsule (2 mg total) by mouth as needed for diarrhea or loose stools. Take 2 capsules at the onset of diarrhea, then 1 every 2 hours until 12 hours with no BM. May take 2 every 4 hours at night. If diarrhea recurrs, repeat., Disp: 100 capsule, Rfl: 0 .  LORazepam (ATIVAN) 1 MG tablet, Take 1 tablet (1 mg total) by mouth every 6 (six) hours as needed for anxiety. Prn nausea/vomiting, Disp: 30 tablet, Rfl: 0 .  magic mouthwash w/lidocaine SOLN, Take 5 mLs by mouth 3 (three) times daily as needed for mouth pain., Disp: 120 mL, Rfl: 1 .  neomycin (MYCIFRADIN) 500 MG tablet, Take two tablets at 1:00pm, 2:00pm and 11:00pm the night before surgery., Disp: , Rfl:  .  nystatin (MYCOSTATIN) 100000 UNIT/ML suspension, 100,000 Units., Disp: , Rfl: 0 .  ondansetron (ZOFRAN) 8 MG tablet, Take 1 tablet (8 mg total) by mouth every 8 (eight) hours as needed for nausea or vomiting. Start Day 3 after chemo., Disp: 20 tablet, Rfl: 0 .  prochlorperazine (COMPAZINE) 10 MG tablet, Take 1 tablet (10 mg total) by mouth every 6 (six) hours as needed for nausea or vomiting., Disp: 30 tablet,  Rfl: 1 .  promethazine (PHENERGAN) 25 MG tablet, Take 1 tablet (25 mg total) by mouth every 6 (six) hours as needed for nausea or vomiting., Disp: 12 tablet, Rfl: 0 .  traMADol (ULTRAM) 50 MG tablet, Take 1 tablet (50 mg total) by mouth every 6 (six) hours as needed. (Patient taking differently: Take 50 mg by mouth every 6 (six) hours as needed for moderate pain. ), Disp: 90 tablet, Rfl: 0 No current facility-administered medications for this visit.   Facility-Administered Medications Ordered in Other Visits:  .  clindamycin (CLEOCIN) 900 mg in dextrose 5 % 50 mL IVPB, 900 mg, Intravenous, 60 min Pre-Op **AND** gentamicin  (GARAMYCIN) 340 mg in dextrose 5 % 50 mL IVPB, 5 mg/kg, Intravenous, 60 min Pre-Op, [name], MD .  dextrose 5 % solution, , Intravenous, Continuous, Aalyiah Camberos, Rudell Cobb, MD, Stopped at 04/09/16 1418 .  heparin lock flush 100 unit/mL, 500 Units, Intracatheter, Once PRN, Volanda Napoleon, MD .  sodium chloride flush (NS) 0.9 % injection 10 mL, 10 mL, Intracatheter, PRN, Volanda Napoleon, MD  Allergies:  Allergies  Allergen Reactions  . Bactrim [Sulfamethoxazole-Trimethoprim]   . Oxaprozin Hives    REACTION: Hives  . Penicillins Hives    REACTION: Hives Has patient had a PCN reaction causing immediate rash, facial/tongue/throat swelling, SOB or lightheadedness with hypotension: no Has patient had a PCN reaction causing severe rash involving mucus membranes or skin necrosis: {no Has patient had a PCN reaction that required hospitalization no Has patient had a PCN reaction occurring within the last 10 years: no If all of the above answers are "NO", then may proceed with Cephalosporin use.  . Sulfonamide Derivatives Swelling    Massive extremity swelling     Past Medical History, Surgical history, Social history, and Family History were reviewed and updated.  Review of Systems:  As above  Physical Exam:  weight is 169 lb (76.7 kg). Her oral temperature is 97.6 F (36.4 C). Her blood pressure is 116/83 and her pulse is 69. Her respiration is 18 and oxygen saturation is 100%.   Wt Readings from Last 3 Encounters:  10/15/16 169 lb (76.7 kg)  10/01/16 165 lb (74.8 kg)  09/17/16 164 lb (74.4 kg)      Well-developed and well-nourished white female in no obvious distress. Head and neck exam shows no ocular or oral lesions. She has no palpable cervical or supraclavicular lymph nodes. Lungs are clear. Cardiac exam regular rate and rhythm with no murmurs, rubs or bruits. Abdomen is soft. She has a dressing over the laparotomy wound. There is no fluid wave. She has no guarding or rebound  tenderness. She has no liver or spleen tip. Her laparoscopy scars are healing.  Back exam shows no tenderness over the spine, ribs or hips. Extremities shows no clubbing, cyanosis or edema. She's good range motion of her joints. Skin exam shows no rashes, ecchymoses or petechia. Neurological exam shows no focal neurological deficits.  Lab Results  Component Value Date   WBC 5.8 10/15/2016   HGB 12.6 10/15/2016   HCT 37.4 10/15/2016   MCV 89 10/15/2016   PLT 213 10/15/2016     Chemistry      Component Value Date/Time   NA 142 10/15/2016 0802   NA 141 07/30/2016 0750   K 4.2 10/15/2016 0802   K 4.2 07/30/2016 0750   CL 107 10/15/2016 0802   CO2 28 10/15/2016 0802   CO2 26 07/30/2016 0750   BUN 12  10/15/2016 0802   BUN 12.1 07/30/2016 0750   CREATININE 0.7 10/15/2016 0802   CREATININE 0.7 07/30/2016 0750      Component Value Date/Time   CALCIUM 9.5 10/15/2016 0802   CALCIUM 9.5 07/30/2016 0750   ALKPHOS 90 (H) 10/15/2016 0802   ALKPHOS 72 07/30/2016 0750   AST 42 (H) 10/15/2016 0802   AST 22 07/30/2016 0750   ALT 54 (H) 10/15/2016 0802   ALT 22 07/30/2016 0750   BILITOT 0.70 10/15/2016 0802   BILITOT 0.54 07/30/2016 0750         Impression and Plan: Ms. Snowberger is a 46 year old white female. She initially presented with stage II colon cancer back in I think December 2016. She had a very high Oncotype score of 37. Because of this, we went ahead and gave her adjuvant chemotherapy with Xeloda. Despite this, her cancer has recurred.  I am incredibly pleased that she is done so well. Her response to chemotherapy has been fantastic.  Her liver tests are slightly elevated. I will continue treating her. We will just have to monitor these.  I'm not sure why she would have this intermittent pain over on the right side. It might be scar tissue. I would not think it would be indicative of recurrent disease.  We will need to continue her on systemic chemotherapy. We will go ahead  and start today. This will be her 10th of 12 cycles.   We will plan to get her back in 2 more weeks for her 10th cycle.  Volanda Napoleon, MD 7/24/20188:49 AM

## 2016-10-17 ENCOUNTER — Ambulatory Visit (HOSPITAL_BASED_OUTPATIENT_CLINIC_OR_DEPARTMENT_OTHER): Payer: BLUE CROSS/BLUE SHIELD

## 2016-10-17 VITALS — BP 107/64 | HR 78 | Temp 98.2°F | Resp 20

## 2016-10-17 DIAGNOSIS — C189 Malignant neoplasm of colon, unspecified: Secondary | ICD-10-CM

## 2016-10-17 DIAGNOSIS — C187 Malignant neoplasm of sigmoid colon: Secondary | ICD-10-CM

## 2016-10-17 DIAGNOSIS — C772 Secondary and unspecified malignant neoplasm of intra-abdominal lymph nodes: Principal | ICD-10-CM

## 2016-10-17 MED ORDER — HEPARIN SOD (PORK) LOCK FLUSH 100 UNIT/ML IV SOLN
500.0000 [IU] | Freq: Once | INTRAVENOUS | Status: AC | PRN
  Administered 2016-10-17: 500 [IU]
  Filled 2016-10-17: qty 5

## 2016-10-17 MED ORDER — SODIUM CHLORIDE 0.9% FLUSH
10.0000 mL | INTRAVENOUS | Status: DC | PRN
Start: 2016-10-17 — End: 2016-10-17
  Administered 2016-10-17: 10 mL
  Filled 2016-10-17: qty 10

## 2016-10-17 NOTE — Patient Instructions (Signed)
Implanted Port Home Guide An implanted port is a type of central line that is placed under the skin. Central lines are used to provide IV access when treatment or nutrition needs to be given through a person's veins. Implanted ports are used for long-term IV access. An implanted port may be placed because:  You need IV medicine that would be irritating to the small veins in your hands or arms.  You need long-term IV medicines, such as antibiotics.  You need IV nutrition for a long period.  You need frequent blood draws for lab tests.  You need dialysis.  Implanted ports are usually placed in the chest area, but they can also be placed in the upper arm, the abdomen, or the leg. An implanted port has two main parts:  Reservoir. The reservoir is round and will appear as a small, raised area under your skin. The reservoir is the part where a needle is inserted to give medicines or draw blood.  Catheter. The catheter is a thin, flexible tube that extends from the reservoir. The catheter is placed into a large vein. Medicine that is inserted into the reservoir goes into the catheter and then into the vein.  How will I care for my incision site? Do not get the incision site wet. Bathe or shower as directed by your health care provider. How is my port accessed? Special steps must be taken to access the port:  Before the port is accessed, a numbing cream can be placed on the skin. This helps numb the skin over the port site.  Your health care provider uses a sterile technique to access the port. ? Your health care provider must put on a mask and sterile gloves. ? The skin over your port is cleaned carefully with an antiseptic and allowed to dry. ? The port is gently pinched between sterile gloves, and a needle is inserted into the port.  Only "non-coring" port needles should be used to access the port. Once the port is accessed, a blood return should be checked. This helps ensure that the port  is in the vein and is not clogged.  If your port needs to remain accessed for a constant infusion, a clear (transparent) bandage will be placed over the needle site. The bandage and needle will need to be changed every week, or as directed by your health care provider.  Keep the bandage covering the needle clean and dry. Do not get it wet. Follow your health care provider's instructions on how to take a shower or bath while the port is accessed.  If your port does not need to stay accessed, no bandage is needed over the port.  What is flushing? Flushing helps keep the port from getting clogged. Follow your health care provider's instructions on how and when to flush the port. Ports are usually flushed with saline solution or a medicine called heparin. The need for flushing will depend on how the port is used.  If the port is used for intermittent medicines or blood draws, the port will need to be flushed: ? After medicines have been given. ? After blood has been drawn. ? As part of routine maintenance.  If a constant infusion is running, the port may not need to be flushed.  How long will my port stay implanted? The port can stay in for as long as your health care provider thinks it is needed. When it is time for the port to come out, surgery will be   done to remove it. The procedure is similar to the one performed when the port was put in. When should I seek immediate medical care? When you have an implanted port, you should seek immediate medical care if:  You notice a bad smell coming from the incision site.  You have swelling, redness, or drainage at the incision site.  You have more swelling or pain at the port site or the surrounding area.  You have a fever that is not controlled with medicine.  This information is not intended to replace advice given to you by your health care provider. Make sure you discuss any questions you have with your health care provider. Document  Released: 03/11/2005 Document Revised: 08/17/2015 Document Reviewed: 11/16/2012 Elsevier Interactive Patient Education  2017 [employer]  

## 2016-10-29 ENCOUNTER — Ambulatory Visit (HOSPITAL_BASED_OUTPATIENT_CLINIC_OR_DEPARTMENT_OTHER): Payer: BLUE CROSS/BLUE SHIELD | Admitting: Family

## 2016-10-29 ENCOUNTER — Ambulatory Visit: Payer: BLUE CROSS/BLUE SHIELD

## 2016-10-29 ENCOUNTER — Other Ambulatory Visit (HOSPITAL_BASED_OUTPATIENT_CLINIC_OR_DEPARTMENT_OTHER): Payer: BLUE CROSS/BLUE SHIELD

## 2016-10-29 ENCOUNTER — Ambulatory Visit (HOSPITAL_BASED_OUTPATIENT_CLINIC_OR_DEPARTMENT_OTHER): Payer: BLUE CROSS/BLUE SHIELD

## 2016-10-29 DIAGNOSIS — C184 Malignant neoplasm of transverse colon: Secondary | ICD-10-CM

## 2016-10-29 DIAGNOSIS — C189 Malignant neoplasm of colon, unspecified: Secondary | ICD-10-CM | POA: Diagnosis not present

## 2016-10-29 DIAGNOSIS — C772 Secondary and unspecified malignant neoplasm of intra-abdominal lymph nodes: Principal | ICD-10-CM

## 2016-10-29 DIAGNOSIS — Z5111 Encounter for antineoplastic chemotherapy: Secondary | ICD-10-CM

## 2016-10-29 DIAGNOSIS — R7989 Other specified abnormal findings of blood chemistry: Secondary | ICD-10-CM

## 2016-10-29 DIAGNOSIS — C187 Malignant neoplasm of sigmoid colon: Secondary | ICD-10-CM

## 2016-10-29 LAB — CMP (CANCER CENTER ONLY)
ALT: 70 U/L — AB (ref 10–47)
AST: 59 U/L — ABNORMAL HIGH (ref 11–38)
Albumin: 3.4 g/dL (ref 3.3–5.5)
Alkaline Phosphatase: 101 U/L — ABNORMAL HIGH (ref 26–84)
BUN, Bld: 13 mg/dL (ref 7–22)
CALCIUM: 9.3 mg/dL (ref 8.0–10.3)
CO2: 27 meq/L (ref 18–33)
Chloride: 110 mEq/L — ABNORMAL HIGH (ref 98–108)
Creat: 0.8 mg/dl (ref 0.6–1.2)
GLUCOSE: 91 mg/dL (ref 73–118)
POTASSIUM: 4.1 meq/L (ref 3.3–4.7)
Sodium: 140 mEq/L (ref 128–145)
Total Bilirubin: 0.7 mg/dl (ref 0.20–1.60)
Total Protein: 6.7 g/dL (ref 6.4–8.1)

## 2016-10-29 LAB — CBC WITH DIFFERENTIAL (CANCER CENTER ONLY)
BASO#: 0 10*3/uL (ref 0.0–0.2)
BASO%: 0.4 % (ref 0.0–2.0)
EOS%: 1.5 % (ref 0.0–7.0)
Eosinophils Absolute: 0.1 10*3/uL (ref 0.0–0.5)
HEMATOCRIT: 36.3 % (ref 34.8–46.6)
HGB: 12.2 g/dL (ref 11.6–15.9)
LYMPH#: 1.1 10*3/uL (ref 0.9–3.3)
LYMPH%: 24.9 % (ref 14.0–48.0)
MCH: 29.9 pg (ref 26.0–34.0)
MCHC: 33.6 g/dL (ref 32.0–36.0)
MCV: 89 fL (ref 81–101)
MONO#: 0.6 10*3/uL (ref 0.1–0.9)
MONO%: 13.7 % — ABNORMAL HIGH (ref 0.0–13.0)
NEUT#: 2.7 10*3/uL (ref 1.5–6.5)
NEUT%: 59.5 % (ref 39.6–80.0)
Platelets: 218 10*3/uL (ref 145–400)
RBC: 4.08 10*6/uL (ref 3.70–5.32)
RDW: 13.3 % (ref 11.1–15.7)
WBC: 4.5 10*3/uL (ref 3.9–10.0)

## 2016-10-29 LAB — CEA (IN HOUSE-CHCC): CEA (CHCC-IN HOUSE): 2.02 ng/mL (ref 0.00–5.00)

## 2016-10-29 MED ORDER — PALONOSETRON HCL INJECTION 0.25 MG/5ML
0.2500 mg | Freq: Once | INTRAVENOUS | Status: AC
  Administered 2016-10-29: 0.25 mg via INTRAVENOUS

## 2016-10-29 MED ORDER — OXALIPLATIN CHEMO INJECTION 100 MG/20ML
68.0000 mg/m2 | Freq: Once | INTRAVENOUS | Status: AC
  Administered 2016-10-29: 120 mg via INTRAVENOUS
  Filled 2016-10-29: qty 20

## 2016-10-29 MED ORDER — DEXAMETHASONE SODIUM PHOSPHATE 10 MG/ML IJ SOLN
INTRAMUSCULAR | Status: AC
  Filled 2016-10-29: qty 1

## 2016-10-29 MED ORDER — HEPARIN SOD (PORK) LOCK FLUSH 100 UNIT/ML IV SOLN
500.0000 [IU] | Freq: Once | INTRAVENOUS | Status: DC | PRN
  Filled 2016-10-29: qty 5

## 2016-10-29 MED ORDER — ATROPINE SULFATE 1 MG/ML IJ SOLN: 0.5000 mg | Freq: Once | INTRAMUSCULAR | Status: DC | PRN

## 2016-10-29 MED ORDER — IRINOTECAN HCL CHEMO INJECTION 100 MG/5ML
132.0000 mg/m2 | Freq: Once | INTRAVENOUS | Status: AC
  Administered 2016-10-29: 240 mg via INTRAVENOUS
  Filled 2016-10-29: qty 10

## 2016-10-29 MED ORDER — PALONOSETRON HCL INJECTION 0.25 MG/5ML
INTRAVENOUS | Status: AC
  Filled 2016-10-29: qty 5

## 2016-10-29 MED ORDER — SODIUM CHLORIDE 0.9 % IV SOLN
2560.0000 mg/m2 | INTRAVENOUS | Status: DC
  Administered 2016-10-29: 4550 mg via INTRAVENOUS
  Filled 2016-10-29: qty 91

## 2016-10-29 MED ORDER — DEXTROSE 5 % IV SOLN
Freq: Once | INTRAVENOUS | Status: AC
  Administered 2016-10-29: 12:00:00 via INTRAVENOUS

## 2016-10-29 MED ORDER — LEUCOVORIN CALCIUM INJECTION 350 MG
197.0000 mg/m2 | Freq: Once | INTRAVENOUS | Status: AC
  Administered 2016-10-29: 350 mg via INTRAVENOUS
  Filled 2016-10-29: qty 17.5

## 2016-10-29 MED ORDER — DEXAMETHASONE SODIUM PHOSPHATE 10 MG/ML IJ SOLN
10.0000 mg | Freq: Once | INTRAMUSCULAR | Status: AC
  Administered 2016-10-29: 10 mg via INTRAVENOUS

## 2016-10-29 MED ORDER — SODIUM CHLORIDE 0.9% FLUSH
10.0000 mL | INTRAVENOUS | Status: DC | PRN
  Filled 2016-10-29: qty 10

## 2016-10-29 MED FILL — PROCHLORPERAZINE 10 MG TAB: 10 | 8 days supply | Qty: 30 | Fill #1

## 2016-10-29 NOTE — Patient Instructions (Signed)
Implanted Port Home Guide An implanted port is a type of central line that is placed under the skin. Central lines are used to provide IV access when treatment or nutrition needs to be given through a person's veins. Implanted ports are used for long-term IV access. An implanted port may be placed because:  You need IV medicine that would be irritating to the small veins in your hands or arms.  You need long-term IV medicines, such as antibiotics.  You need IV nutrition for a long period.  You need frequent blood draws for lab tests.  You need dialysis.  Implanted ports are usually placed in the chest area, but they can also be placed in the upper arm, the abdomen, or the leg. An implanted port has two main parts:  Reservoir. The reservoir is round and will appear as a small, raised area under your skin. The reservoir is the part where a needle is inserted to give medicines or draw blood.  Catheter. The catheter is a thin, flexible tube that extends from the reservoir. The catheter is placed into a large vein. Medicine that is inserted into the reservoir goes into the catheter and then into the vein.  How will I care for my incision site? Do not get the incision site wet. Bathe or shower as directed by your health care provider. How is my port accessed? Special steps must be taken to access the port:  Before the port is accessed, a numbing cream can be placed on the skin. This helps numb the skin over the port site.  Your health care provider uses a sterile technique to access the port. ? Your health care provider must put on a mask and sterile gloves. ? The skin over your port is cleaned carefully with an antiseptic and allowed to dry. ? The port is gently pinched between sterile gloves, and a needle is inserted into the port.  Only "non-coring" port needles should be used to access the port. Once the port is accessed, a blood return should be checked. This helps ensure that the port  is in the vein and is not clogged.  If your port needs to remain accessed for a constant infusion, a clear (transparent) bandage will be placed over the needle site. The bandage and needle will need to be changed every week, or as directed by your health care provider.  Keep the bandage covering the needle clean and dry. Do not get it wet. Follow your health care provider's instructions on how to take a shower or bath while the port is accessed.  If your port does not need to stay accessed, no bandage is needed over the port.  What is flushing? Flushing helps keep the port from getting clogged. Follow your health care provider's instructions on how and when to flush the port. Ports are usually flushed with saline solution or a medicine called heparin. The need for flushing will depend on how the port is used.  If the port is used for intermittent medicines or blood draws, the port will need to be flushed: ? After medicines have been given. ? After blood has been drawn. ? As part of routine maintenance.  If a constant infusion is running, the port may not need to be flushed.  How long will my port stay implanted? The port can stay in for as long as your health care provider thinks it is needed. When it is time for the port to come out, surgery will be   done to remove it. The procedure is similar to the one performed when the port was put in. When should I seek immediate medical care? When you have an implanted port, you should seek immediate medical care if:  You notice a bad smell coming from the incision site.  You have swelling, redness, or drainage at the incision site.  You have more swelling or pain at the port site or the surrounding area.  You have a fever that is not controlled with medicine.  This information is not intended to replace advice given to you by your health care provider. Make sure you discuss any questions you have with your health care provider. Document  Released: 03/11/2005 Document Revised: 08/17/2015 Document Reviewed: 11/16/2012 Elsevier Interactive Patient Education  2017 [employer]  

## 2016-10-29 NOTE — Patient Instructions (Signed)
Irinotecan injection What is this medicine? IRINOTECAN (ir in oh TEE kan ) is a chemotherapy drug. It is used to treat colon and rectal cancer. This medicine may be used for other purposes; ask your health care provider or pharmacist if you have questions. COMMON BRAND NAME(S): Camptosar What should I tell my health care provider before I take this medicine? They need to know if you have any of these conditions: -blood disorders -dehydration -diarrhea -infection (especially a virus infection such as chickenpox, cold sores, or herpes) -liver disease -low blood counts, like low white cell, platelet, or red cell counts -recent or ongoing radiation therapy -an unusual or allergic reaction to irinotecan, sorbitol, other chemotherapy, other medicines, foods, dyes, or preservatives -pregnant or trying to get pregnant -breast-feeding How should I use this medicine? This drug is given as an infusion into a vein. It is administered in a hospital or clinic by a specially trained health care professional. Talk to your pediatrician regarding the use of this medicine in children. Special care may be needed. Overdosage: If you think you have taken too much of this medicine contact a poison control center or emergency room at once. NOTE: This medicine is only for you. Do not share this medicine with others. What if I miss a dose? It is important not to miss your dose. Call your doctor or health care professional if you are unable to keep an appointment. What may interact with this medicine? Do not take this medicine with any of the following medications: -atazanavir -certain medicines for fungal infections like itraconazole and ketoconazole -St. John's Wort This medicine may also interact with the following medications: -dexamethasone -diuretics -laxatives -medicines for seizures like carbamazepine, mephobarbital, phenobarbital, phenytoin, primidone -medicines to increase blood counts like  filgrastim, pegfilgrastim, sargramostim -prochlorperazine -vaccines This list may not describe all possible interactions. Give your health care provider a list of all the medicines, herbs, non-prescription drugs, or dietary supplements you use. Also tell them if you smoke, drink alcohol, or use illegal drugs. Some items may interact with your medicine. What should I watch for while using this medicine? Your condition will be monitored carefully while you are receiving this medicine. You will need important blood work done while you are taking this medicine. This drug may make you feel generally unwell. This is not uncommon, as chemotherapy can affect healthy cells as well as cancer cells. Report any side effects. Continue your course of treatment even though you feel ill unless your doctor tells you to stop. In some cases, you may be given additional medicines to help with side effects. Follow all directions for their use. You may get drowsy or dizzy. Do not drive, use machinery, or do anything that needs mental alertness until you know how this medicine affects you. Do not stand or sit up quickly, especially if you are an older patient. This reduces the risk of dizzy or fainting spells. Call your doctor or health care professional for advice if you get a fever, chills or sore throat, or other symptoms of a cold or flu. Do not treat yourself. This drug decreases your body's ability to fight infections. Try to avoid being around people who are sick. This medicine may increase your risk to bruise or bleed. Call your doctor or health care professional if you notice any unusual bleeding. Be careful brushing and flossing your teeth or using a toothpick because you may get an infection or bleed more easily. If you have any dental work done, tell  your dentist you are receiving this medicine. Avoid taking products that contain aspirin, acetaminophen, ibuprofen, naproxen, or ketoprofen unless instructed by your  doctor. These medicines may hide a fever. Do not become pregnant while taking this medicine. Women should inform their doctor if they wish to become pregnant or think they might be pregnant. There is a potential for serious side effects to an unborn child. Talk to your health care professional or pharmacist for more information. Do not breast-feed an infant while taking this medicine. What side effects may I notice from receiving this medicine? Side effects that you should report to your doctor or health care professional as soon as possible: -allergic reactions like skin rash, itching or hives, swelling of the face, lips, or tongue -low blood counts - this medicine may decrease the number of white blood cells, red blood cells and platelets. You may be at increased risk for infections and bleeding. -signs of infection - fever or chills, cough, sore throat, pain or difficulty passing urine -signs of decreased platelets or bleeding - bruising, pinpoint red spots on the skin, black, tarry stools, blood in the urine -signs of decreased red blood cells - unusually weak or tired, fainting spells, lightheadedness -breathing problems -chest pain -diarrhea -feeling faint or lightheaded, falls -flushing, runny nose, sweating during infusion -mouth sores or pain -pain, swelling, redness or irritation where injected -pain, swelling, warmth in the leg -pain, tingling, numbness in the hands or feet -problems with balance, talking, walking -stomach cramps, pain -trouble passing urine or change in the amount of urine -vomiting as to be unable to hold down drinks or food -yellowing of the eyes or skin Side effects that usually do not require medical attention (report to your doctor or health care professional if they continue or are bothersome): -constipation -hair loss -headache -loss of appetite -nausea, vomiting -stomach upset This list may not describe all possible side effects. Call your doctor for  medical advice about side effects. You may report side effects to FDA at 1-800-FDA-1088. Where should I keep my medicine? This drug is given in a hospital or clinic and will not be stored at home. NOTE: This sheet is a summary. It may not cover all possible information. If you have questions about this medicine, talk to your doctor, pharmacist, or health care provider.  2018 Elsevier/Gold Standard (2012-09-07 16:29:32) Leucovorin injection What is this medicine? LEUCOVORIN (loo koe VOR in) is used to prevent or treat the harmful effects of some medicines. This medicine is used to treat anemia caused by a low amount of folic acid in the body. It is also used with 5-fluorouracil (5-FU) to treat colon cancer. This medicine may be used for other purposes; ask your health care provider or pharmacist if you have questions. What should I tell my health care provider before I take this medicine? They need to know if you have any of these conditions: -anemia from low levels of vitamin B-12 in the blood -an unusual or allergic reaction to leucovorin, folic acid, other medicines, foods, dyes, or preservatives -pregnant or trying to get pregnant -breast-feeding How should I use this medicine? This medicine is for injection into a muscle or into a vein. It is given by a health care professional in a hospital or clinic setting. Talk to your pediatrician regarding the use of this medicine in children. Special care may be needed. Overdosage: If you think you have taken too much of this medicine contact a poison control center or emergency room at  once. NOTE: This medicine is only for you. Do not share this medicine with others. What if I miss a dose? This does not apply. What may interact with this medicine? -capecitabine -fluorouracil -phenobarbital -phenytoin -primidone -trimethoprim-sulfamethoxazole This list may not describe all possible interactions. Give your health care provider a list of all  the medicines, herbs, non-prescription drugs, or dietary supplements you use. Also tell them if you smoke, drink alcohol, or use illegal drugs. Some items may interact with your medicine. What should I watch for while using this medicine? Your condition will be monitored carefully while you are receiving this medicine. This medicine may increase the side effects of 5-fluorouracil, 5-FU. Tell your doctor or health care professional if you have diarrhea or mouth sores that do not get better or that get worse. What side effects may I notice from receiving this medicine? Side effects that you should report to your doctor or health care professional as soon as possible: -allergic reactions like skin rash, itching or hives, swelling of the face, lips, or tongue -breathing problems -fever, infection -mouth sores -unusual bleeding or bruising -unusually weak or tired Side effects that usually do not require medical attention (report to your doctor or health care professional if they continue or are bothersome): -constipation or diarrhea -loss of appetite -nausea, vomiting This list may not describe all possible side effects. Call your doctor for medical advice about side effects. You may report side effects to FDA at 1-800-FDA-1088. Where should I keep my medicine? This drug is given in a hospital or clinic and will not be stored at home. NOTE: This sheet is a summary. It may not cover all possible information. If you have questions about this medicine, talk to your doctor, pharmacist, or health care provider.  2018 Elsevier/Gold Standard (2007-09-15 16:50:29) Oxaliplatin Injection What is this medicine? OXALIPLATIN (ox AL i PLA tin) is a chemotherapy drug. It targets fast dividing cells, like cancer cells, and causes these cells to die. This medicine is used to treat cancers of the colon and rectum, and many other cancers. This medicine may be used for other purposes; ask your health care provider  or pharmacist if you have questions. COMMON BRAND NAME(S): Eloxatin What should I tell my health care provider before I take this medicine? They need to know if you have any of these conditions: -kidney disease -an unusual or allergic reaction to oxaliplatin, other chemotherapy, other medicines, foods, dyes, or preservatives -pregnant or trying to get pregnant -breast-feeding How should I use this medicine? This drug is given as an infusion into a vein. It is administered in a hospital or clinic by a specially trained health care professional. Talk to your pediatrician regarding the use of this medicine in children. Special care may be needed. Overdosage: If you think you have taken too much of this medicine contact a poison control center or emergency room at once. NOTE: This medicine is only for you. Do not share this medicine with others. What if I miss a dose? It is important not to miss a dose. Call your doctor or health care professional if you are unable to keep an appointment. What may interact with this medicine? -medicines to increase blood counts like filgrastim, pegfilgrastim, sargramostim -probenecid -some antibiotics like amikacin, gentamicin, neomycin, polymyxin B, streptomycin, tobramycin -zalcitabine Talk to your doctor or health care professional before taking any of these medicines: -acetaminophen -aspirin -ibuprofen -ketoprofen -naproxen This list may not describe all possible interactions. Give your health care provider a   list of all the medicines, herbs, non-prescription drugs, or dietary supplements you use. Also tell them if you smoke, drink alcohol, or use illegal drugs. Some items may interact with your medicine. What should I watch for while using this medicine? Your condition will be monitored carefully while you are receiving this medicine. You will need important blood work done while you are taking this medicine. This medicine can make you more sensitive to  cold. Do not drink cold drinks or use ice. Cover exposed skin before coming in contact with cold temperatures or cold objects. When out in cold weather wear warm clothing and cover your mouth and nose to warm the air that goes into your lungs. Tell your doctor if you get sensitive to the cold. This drug may make you feel generally unwell. This is not uncommon, as chemotherapy can affect healthy cells as well as cancer cells. Report any side effects. Continue your course of treatment even though you feel ill unless your doctor tells you to stop. In some cases, you may be given additional medicines to help with side effects. Follow all directions for their use. Call your doctor or health care professional for advice if you get a fever, chills or sore throat, or other symptoms of a cold or flu. Do not treat yourself. This drug decreases your body's ability to fight infections. Try to avoid being around people who are sick. This medicine may increase your risk to bruise or bleed. Call your doctor or health care professional if you notice any unusual bleeding. Be careful brushing and flossing your teeth or using a toothpick because you may get an infection or bleed more easily. If you have any dental work done, tell your dentist you are receiving this medicine. Avoid taking products that contain aspirin, acetaminophen, ibuprofen, naproxen, or ketoprofen unless instructed by your doctor. These medicines may hide a fever. Do not become pregnant while taking this medicine. Women should inform their doctor if they wish to become pregnant or think they might be pregnant. There is a potential for serious side effects to an unborn child. Talk to your health care professional or pharmacist for more information. Do not breast-feed an infant while taking this medicine. Call your doctor or health care professional if you get diarrhea. Do not treat yourself. What side effects may I notice from receiving this medicine? Side  effects that you should report to your doctor or health care professional as soon as possible: -allergic reactions like skin rash, itching or hives, swelling of the face, lips, or tongue -low blood counts - This drug may decrease the number of white blood cells, red blood cells and platelets. You may be at increased risk for infections and bleeding. -signs of infection - fever or chills, cough, sore throat, pain or difficulty passing urine -signs of decreased platelets or bleeding - bruising, pinpoint red spots on the skin, black, tarry stools, nosebleeds -signs of decreased red blood cells - unusually weak or tired, fainting spells, lightheadedness -breathing problems -chest pain, pressure -cough -diarrhea -jaw tightness -mouth sores -nausea and vomiting -pain, swelling, redness or irritation at the injection site -pain, tingling, numbness in the hands or feet -problems with balance, talking, walking -redness, blistering, peeling or loosening of the skin, including inside the mouth -trouble passing urine or change in the amount of urine Side effects that usually do not require medical attention (report to your doctor or health care professional if they continue or are bothersome): -changes in vision -constipation -hair   loss -loss of appetite -metallic taste in the mouth or changes in taste -stomach pain This list may not describe all possible side effects. Call your doctor for medical advice about side effects. You may report side effects to FDA at 1-800-FDA-1088. Where should I keep my medicine? This drug is given in a hospital or clinic and will not be stored at home. NOTE: This sheet is a summary. It may not cover all possible information. If you have questions about this medicine, talk to your doctor, pharmacist, or health care provider.  2018 Elsevier/Gold Standard (2007-10-06 17:22:47)  

## 2016-10-29 NOTE — Progress Notes (Signed)
Hematology and Oncology Follow Up Visit  Carla Mills [id number] 09/04/1970 46 y.o. 10/29/2016   Principle Diagnosis:  Recurrent colon cancer - HER2 (-)/MSI stable/MMR normal/BRAF -  Current Therapy:   FOLFOXIRI on 04/09/2016 - s/p cycle 10 Status post exploratory laparotomy with HIPEC - 07/29/2016  Interim History:  Carla Mills is here today with her husband for follow-up and treatment. She had some fatigue and nausea (no vomiting with compazine) for several days after cycle 10. CEA in July was 1.71.  She states that her abdominal discomfort is much improved and not often.  Midline abdominal incision and lap sites have healed nicely. No redness or edema.  No lymphadenopathy found on exam. No episodes of bleeding, bruising or petechiae.  No fever, chills, oral lesions, cough, rash, dizziness, headache, vision changes, SOB, chest pain, palpitations or changes in bowel or bladder habits.  No swelling, tenderness, numbness or tingling in her extremities. No c/o pain at this time.  She has maintained a good appetite and is staying well hydrated. Her weight is stable.  She is staying active and exercising each day.   ECOG Performance Status: 1 - Symptomatic but completely ambulatory  Medications:  Allergies as of 10/29/2016      Reactions   Bactrim [sulfamethoxazole-trimethoprim]    Oxaprozin Hives   REACTION: Hives   Penicillins Hives   REACTION: Hives Has patient had a PCN reaction causing immediate rash, facial/tongue/throat swelling, SOB or lightheadedness with hypotension: no Has patient had a PCN reaction causing severe rash involving mucus membranes or skin necrosis: {no Has patient had a PCN reaction that required hospitalization no Has patient had a PCN reaction occurring within the last 10 years: no If all of the above answers are "NO", then may proceed with Cephalosporin use.   Sulfonamide Derivatives Swelling   Massive extremity swelling       Medication List         Accurate as of 10/29/16  9:41 AM. Always use your most recent med list.          acetaminophen 500 MG tablet Commonly known as:  TYLENOL Take 1,000 mg by mouth every 8 (eight) hours as needed for moderate pain.   dexamethasone 4 MG tablet Commonly known as:  DECADRON Take 2 tablets (8 mg total) by mouth daily. Start the day after chemo and take for 2 days.   fexofenadine 180 MG tablet Commonly known as:  ALLEGRA Take 180 mg by mouth daily as needed for allergies.   fluticasone 50 MCG/ACT nasal spray Commonly known as:  FLONASE Place 1 spray into both nostrils 2 (two) times daily.   HYDROcodone-acetaminophen 5-325 MG tablet Commonly known as:  NORCO/VICODIN Take 1-2 tablets by mouth every 6 (six) hours as needed.   ibuprofen 200 MG tablet Commonly known as:  ADVIL,MOTRIN Take 400 mg by mouth every 6 (six) hours as needed for headache or mild pain.   lidocaine-prilocaine cream Commonly known as:  EMLA Apply 1 application topically as needed. Apply to [facility] 1 hour prior to treatment.   loperamide 2 MG capsule Commonly known as:  IMODIUM Take 1 capsule (2 mg total) by mouth as needed for diarrhea or loose stools. Take 2 capsules at the onset of diarrhea, then 1 every 2 hours until 12 hours with no BM. May take 2 every 4 hours at night. If diarrhea recurrs, repeat.   LORazepam 1 MG tablet Commonly known as:  ATIVAN Take 1 tablet (1 mg total) by mouth every 6 (six)  hours as needed for anxiety. Prn nausea/vomiting   magic mouthwash w/lidocaine Soln Take 5 mLs by mouth 3 (three) times daily as needed for mouth pain.   neomycin 500 MG tablet Commonly known as:  MYCIFRADIN Take two tablets at 1:00pm, 2:00pm and 11:00pm the night before surgery.   nystatin 100000 UNIT/ML suspension Commonly known as:  MYCOSTATIN 100,000 Units.   ondansetron 8 MG tablet Commonly known as:  ZOFRAN Take 1 tablet (8 mg total) by mouth every 8 (eight) hours as needed for nausea or vomiting. Start  Day 3 after chemo.   prochlorperazine 10 MG tablet Commonly known as:  COMPAZINE Take 1 tablet (10 mg total) by mouth every 6 (six) hours as needed for nausea or vomiting.   promethazine 25 MG tablet Commonly known as:  PHENERGAN Take 1 tablet (25 mg total) by mouth every 6 (six) hours as needed for nausea or vomiting.   traMADol 50 MG tablet Commonly known as:  ULTRAM Take 1 tablet (50 mg total) by mouth every 6 (six) hours as needed.       Allergies:  Allergies  Allergen Reactions  . Bactrim [Sulfamethoxazole-Trimethoprim]   . Oxaprozin Hives    REACTION: Hives  . Penicillins Hives    REACTION: Hives Has patient had a PCN reaction causing immediate rash, facial/tongue/throat swelling, SOB or lightheadedness with hypotension: no Has patient had a PCN reaction causing severe rash involving mucus membranes or skin necrosis: {no Has patient had a PCN reaction that required hospitalization no Has patient had a PCN reaction occurring within the last 10 years: no If all of the above answers are "NO", then may proceed with Cephalosporin use.  . Sulfonamide Derivatives Swelling    Massive extremity swelling     Past Medical History, Surgical history, Social history, and Family History were reviewed and updated.  Review of Systems: All other 10 point review of systems is negative.   Physical Exam:  vitals were not taken for this visit.  Wt Readings from Last 3 Encounters:  10/29/16 168 lb 1.3 oz (76.2 kg)  10/15/16 169 lb (76.7 kg)  10/01/16 165 lb (74.8 kg)    Ocular: Sclerae unicteric, pupils equal, round and reactive to light Ear-nose-throat: Oropharynx clear, dentition fair Lymphatic: No cervical, supraclavicular or axillary adenopathy Lungs no rales or rhonchi, good excursion bilaterally Heart regular rate and rhythm, no murmur appreciated Abd soft, nontender, positive bowel sounds, no liver or spleen tip palpated on exam, no fluid wave  MSK no focal spinal  tenderness, no joint edema Neuro: non-focal, well-oriented, appropriate affect Breasts: Deferred   Lab Results  Component Value Date   WBC 4.5 10/29/2016   HGB 12.2 10/29/2016   HCT 36.3 10/29/2016   MCV 89 10/29/2016   PLT 218 10/29/2016   Lab Results  Component Value Date   FERRITIN 88 06/22/2015   IRON 98 06/22/2015   TIBC 374 06/22/2015   UIBC 275 06/22/2015   IRONPCTSAT 26 06/22/2015   Lab Results  Component Value Date   RETICCTPCT 0.8 02/12/2015   RBC 4.08 10/29/2016   No results found for: KPAFRELGTCHN, LAMBDASER, KAPLAMBRATIO No results found for: IGGSERUM, IGA, IGMSERUM No results found for: Ronnald Ramp, A1GS, A2GS, Violet Baldy, MSPIKE, SPEI   Chemistry      Component Value Date/Time   NA 142 10/15/2016 0802   NA 141 07/30/2016 0750   K 4.2 10/15/2016 0802   K 4.2 07/30/2016 0750   CL 107 10/15/2016 0802   CO2 28  10/15/2016 0802   CO2 26 07/30/2016 0750   BUN 12 10/15/2016 0802   BUN 12.1 07/30/2016 0750   CREATININE 0.7 10/15/2016 0802   CREATININE 0.7 07/30/2016 0750      Component Value Date/Time   CALCIUM 9.5 10/15/2016 0802   CALCIUM 9.5 07/30/2016 0750   ALKPHOS 90 (H) 10/15/2016 0802   ALKPHOS 72 07/30/2016 0750   AST 42 (H) 10/15/2016 0802   AST 22 07/30/2016 0750   ALT 54 (H) 10/15/2016 0802   ALT 22 07/30/2016 0750   BILITOT 0.70 10/15/2016 0802   BILITOT 0.54 07/30/2016 0750      Impression and Plan: Ms. Gatti is a very pleasant 46 yo caucasian female with recurrent colon cancer. She has tolerated surgery and chemo well. She is here today for cycle 11. She had some fatigue and nausea for several days after cycle 10. This resolved with rest and Compazine.  We will proceed with treatment today as planned. Her LFTs are still mildly elevated but stable. She is asymptomatic at this time.  We will plan to repeat scans after cycle 12.   She has her current treatment and appointment schedule and will contact our office  with any questions or concerns. We can certainly see her sooner if need be.   Eliezer Bottom, NP 8/7/20189:41 AM

## 2016-10-31 ENCOUNTER — Ambulatory Visit (HOSPITAL_BASED_OUTPATIENT_CLINIC_OR_DEPARTMENT_OTHER): Payer: BLUE CROSS/BLUE SHIELD

## 2016-10-31 VITALS — BP 117/64 | HR 70 | Temp 98.6°F | Resp 16

## 2016-10-31 DIAGNOSIS — C187 Malignant neoplasm of sigmoid colon: Secondary | ICD-10-CM

## 2016-10-31 DIAGNOSIS — C189 Malignant neoplasm of colon, unspecified: Secondary | ICD-10-CM

## 2016-10-31 DIAGNOSIS — Z452 Encounter for adjustment and management of vascular access device: Secondary | ICD-10-CM

## 2016-10-31 DIAGNOSIS — C772 Secondary and unspecified malignant neoplasm of intra-abdominal lymph nodes: Principal | ICD-10-CM

## 2016-10-31 MED ORDER — SODIUM CHLORIDE 0.9% FLUSH
10.0000 mL | INTRAVENOUS | Status: DC | PRN
  Administered 2016-10-31: 10 mL
  Filled 2016-10-31: qty 10

## 2016-10-31 MED ORDER — HEPARIN SOD (PORK) LOCK FLUSH 100 UNIT/ML IV SOLN
500.0000 [IU] | Freq: Once | INTRAVENOUS | Status: AC | PRN
  Administered 2016-10-31: 500 [IU]
  Filled 2016-10-31: qty 5

## 2016-10-31 NOTE — Addendum Note (Signed)
Addended by: Randolm Idol on: 10/31/2016 03:00 PM   Modules accepted: Orders

## 2016-10-31 NOTE — Progress Notes (Signed)
5-fu pump d/c'd, all medication infused. Port flushed per protocol. Site unremarkable.  No c/o. VSS.

## 2016-11-12 ENCOUNTER — Other Ambulatory Visit (HOSPITAL_BASED_OUTPATIENT_CLINIC_OR_DEPARTMENT_OTHER): Payer: BLUE CROSS/BLUE SHIELD

## 2016-11-12 ENCOUNTER — Ambulatory Visit (HOSPITAL_BASED_OUTPATIENT_CLINIC_OR_DEPARTMENT_OTHER): Payer: BLUE CROSS/BLUE SHIELD | Admitting: Hematology & Oncology

## 2016-11-12 ENCOUNTER — Ambulatory Visit (HOSPITAL_BASED_OUTPATIENT_CLINIC_OR_DEPARTMENT_OTHER): Payer: BLUE CROSS/BLUE SHIELD

## 2016-11-12 ENCOUNTER — Ambulatory Visit: Payer: BLUE CROSS/BLUE SHIELD

## 2016-11-12 VITALS — BP 115/80 | HR 79 | Temp 98.1°F | Resp 17 | Wt 169.4 lb

## 2016-11-12 DIAGNOSIS — Z5111 Encounter for antineoplastic chemotherapy: Secondary | ICD-10-CM | POA: Diagnosis not present

## 2016-11-12 DIAGNOSIS — C189 Malignant neoplasm of colon, unspecified: Secondary | ICD-10-CM

## 2016-11-12 DIAGNOSIS — C184 Malignant neoplasm of transverse colon: Secondary | ICD-10-CM

## 2016-11-12 DIAGNOSIS — C772 Secondary and unspecified malignant neoplasm of intra-abdominal lymph nodes: Principal | ICD-10-CM

## 2016-11-12 DIAGNOSIS — C187 Malignant neoplasm of sigmoid colon: Secondary | ICD-10-CM

## 2016-11-12 DIAGNOSIS — R7989 Other specified abnormal findings of blood chemistry: Secondary | ICD-10-CM | POA: Diagnosis not present

## 2016-11-12 LAB — CBC WITH DIFFERENTIAL (CANCER CENTER ONLY)
BASO#: 0 10*3/uL (ref 0.0–0.2)
BASO%: 0.4 % (ref 0.0–2.0)
EOS ABS: 0.1 10*3/uL (ref 0.0–0.5)
EOS%: 2.4 % (ref 0.0–7.0)
HEMATOCRIT: 37.1 % (ref 34.8–46.6)
HEMOGLOBIN: 12.7 g/dL (ref 11.6–15.9)
LYMPH#: 1.2 10*3/uL (ref 0.9–3.3)
LYMPH%: 25.3 % (ref 14.0–48.0)
MCH: 30.1 pg (ref 26.0–34.0)
MCHC: 34.2 g/dL (ref 32.0–36.0)
MCV: 88 fL (ref 81–101)
MONO#: 0.6 10*3/uL (ref 0.1–0.9)
MONO%: 12.8 % (ref 0.0–13.0)
NEUT%: 59.1 % (ref 39.6–80.0)
NEUTROS ABS: 2.7 10*3/uL (ref 1.5–6.5)
Platelets: 202 10*3/uL (ref 145–400)
RBC: 4.22 10*6/uL (ref 3.70–5.32)
RDW: 14.3 % (ref 11.1–15.7)
WBC: 4.5 10*3/uL (ref 3.9–10.0)

## 2016-11-12 LAB — CMP (CANCER CENTER ONLY)
ALK PHOS: 104 U/L — AB (ref 26–84)
ALT(SGPT): 56 U/L — ABNORMAL HIGH (ref 10–47)
AST: 52 U/L — AB (ref 11–38)
Albumin: 3.6 g/dL (ref 3.3–5.5)
BILIRUBIN TOTAL: 0.9 mg/dL (ref 0.20–1.60)
BUN, Bld: 16 mg/dL (ref 7–22)
CALCIUM: 9.5 mg/dL (ref 8.0–10.3)
CO2: 28 meq/L (ref 18–33)
CREATININE: 0.9 mg/dL (ref 0.6–1.2)
Chloride: 107 mEq/L (ref 98–108)
Glucose, Bld: 100 mg/dL (ref 73–118)
Potassium: 4.1 mEq/L (ref 3.3–4.7)
SODIUM: 144 meq/L (ref 128–145)
Total Protein: 7.3 g/dL (ref 6.4–8.1)

## 2016-11-12 LAB — CEA (IN HOUSE-CHCC): CEA (CHCC-IN HOUSE): 2.05 ng/mL (ref 0.00–5.00)

## 2016-11-12 MED ORDER — IRINOTECAN HCL CHEMO INJECTION 100 MG/5ML
132.0000 mg/m2 | Freq: Once | INTRAVENOUS | Status: AC
  Administered 2016-11-12: 240 mg via INTRAVENOUS
  Filled 2016-11-12: qty 5

## 2016-11-12 MED ORDER — LEUCOVORIN CALCIUM INJECTION 350 MG
197.0000 mg/m2 | Freq: Once | INTRAVENOUS | Status: AC
  Administered 2016-11-12: 350 mg via INTRAVENOUS
  Filled 2016-11-12: qty 17.5

## 2016-11-12 MED ORDER — DEXAMETHASONE SODIUM PHOSPHATE 10 MG/ML IJ SOLN
10.0000 mg | Freq: Once | INTRAMUSCULAR | Status: AC
  Administered 2016-11-12: 10 mg via INTRAVENOUS

## 2016-11-12 MED ORDER — SODIUM CHLORIDE 0.9 % IV SOLN
2560.0000 mg/m2 | INTRAVENOUS | Status: DC
  Administered 2016-11-12: 4550 mg via INTRAVENOUS
  Filled 2016-11-12: qty 91

## 2016-11-12 MED ORDER — DEXTROSE 5 % IV SOLN
Freq: Once | INTRAVENOUS | Status: AC
  Administered 2016-11-12: 12:00:00 via INTRAVENOUS

## 2016-11-12 MED ORDER — OXALIPLATIN CHEMO INJECTION 100 MG/20ML
68.0000 mg/m2 | Freq: Once | INTRAVENOUS | Status: AC
  Administered 2016-11-12: 120 mg via INTRAVENOUS
  Filled 2016-11-12: qty 4

## 2016-11-12 MED ORDER — PALONOSETRON HCL INJECTION 0.25 MG/5ML
0.2500 mg | Freq: Once | INTRAVENOUS | Status: AC
  Administered 2016-11-12: 0.25 mg via INTRAVENOUS

## 2016-11-12 MED ORDER — DEXAMETHASONE SODIUM PHOSPHATE 10 MG/ML IJ SOLN
INTRAMUSCULAR | Status: AC
  Filled 2016-11-12: qty 1

## 2016-11-12 MED ORDER — PALONOSETRON HCL INJECTION 0.25 MG/5ML
INTRAVENOUS | Status: AC
  Filled 2016-11-12: qty 5

## 2016-11-12 NOTE — Patient Instructions (Signed)
Leucovorin injection What is this medicine? LEUCOVORIN (loo koe VOR in) is used to prevent or treat the harmful effects of some medicines. This medicine is used to treat anemia caused by a low amount of folic acid in the body. It is also used with 5-fluorouracil (5-FU) to treat colon cancer. This medicine may be used for other purposes; ask your health care provider or pharmacist if you have questions. What should I tell my health care provider before I take this medicine? They need to know if you have any of these conditions: -anemia from low levels of vitamin B-12 in the blood -an unusual or allergic reaction to leucovorin, folic acid, other medicines, foods, dyes, or preservatives -pregnant or trying to get pregnant -breast-feeding How should I use this medicine? This medicine is for injection into a muscle or into a vein. It is given by a health care professional in a hospital or clinic setting. Talk to your pediatrician regarding the use of this medicine in children. Special care may be needed. Overdosage: If you think you have taken too much of this medicine contact a poison control center or emergency room at once. NOTE: This medicine is only for you. Do not share this medicine with others. What if I miss a dose? This does not apply. What may interact with this medicine? -capecitabine -fluorouracil -phenobarbital -phenytoin -primidone -trimethoprim-sulfamethoxazole This list may not describe all possible interactions. Give your health care provider a list of all the medicines, herbs, non-prescription drugs, or dietary supplements you use. Also tell them if you smoke, drink alcohol, or use illegal drugs. Some items may interact with your medicine. What should I watch for while using this medicine? Your condition will be monitored carefully while you are receiving this medicine. This medicine may increase the side effects of 5-fluorouracil, 5-FU. Tell your doctor or health care  professional if you have diarrhea or mouth sores that do not get better or that get worse. What side effects may I notice from receiving this medicine? Side effects that you should report to your doctor or health care professional as soon as possible: -allergic reactions like skin rash, itching or hives, swelling of the face, lips, or tongue -breathing problems -fever, infection -mouth sores -unusual bleeding or bruising -unusually weak or tired Side effects that usually do not require medical attention (report to your doctor or health care professional if they continue or are bothersome): -constipation or diarrhea -loss of appetite -nausea, vomiting This list may not describe all possible side effects. Call your doctor for medical advice about side effects. You may report side effects to FDA at 1-800-FDA-1088. Where should I keep my medicine? This drug is given in a hospital or clinic and will not be stored at home. NOTE: This sheet is a summary. It may not cover all possible information. If you have questions about this medicine, talk to your doctor, pharmacist, or health care provider.  2018 Elsevier/Gold Standard (2007-09-15 16:50:29) Oxaliplatin Injection What is this medicine? OXALIPLATIN (ox AL i PLA tin) is a chemotherapy drug. It targets fast dividing cells, like cancer cells, and causes these cells to die. This medicine is used to treat cancers of the colon and rectum, and many other cancers. This medicine may be used for other purposes; ask your health care provider or pharmacist if you have questions. COMMON BRAND NAME(S): Eloxatin What should I tell my health care provider before I take this medicine? They need to know if you have any of these conditions: -kidney  disease -an unusual or allergic reaction to oxaliplatin, other chemotherapy, other medicines, foods, dyes, or preservatives -pregnant or trying to get pregnant -breast-feeding How should I use this medicine? This  drug is given as an infusion into a vein. It is administered in a hospital or clinic by a specially trained health care professional. Talk to your pediatrician regarding the use of this medicine in children. Special care may be needed. Overdosage: If you think you have taken too much of this medicine contact a poison control center or emergency room at once. NOTE: This medicine is only for you. Do not share this medicine with others. What if I miss a dose? It is important not to miss a dose. Call your doctor or health care professional if you are unable to keep an appointment. What may interact with this medicine? -medicines to increase blood counts like filgrastim, pegfilgrastim, sargramostim -probenecid -some antibiotics like amikacin, gentamicin, neomycin, polymyxin B, streptomycin, tobramycin -zalcitabine Talk to your doctor or health care professional before taking any of these medicines: -acetaminophen -aspirin -ibuprofen -ketoprofen -naproxen This list may not describe all possible interactions. Give your health care provider a list of all the medicines, herbs, non-prescription drugs, or dietary supplements you use. Also tell them if you smoke, drink alcohol, or use illegal drugs. Some items may interact with your medicine. What should I watch for while using this medicine? Your condition will be monitored carefully while you are receiving this medicine. You will need important blood work done while you are taking this medicine. This medicine can make you more sensitive to cold. Do not drink cold drinks or use ice. Cover exposed skin before coming in contact with cold temperatures or cold objects. When out in cold weather wear warm clothing and cover your mouth and nose to warm the air that goes into your lungs. Tell your doctor if you get sensitive to the cold. This drug may make you feel generally unwell. This is not uncommon, as chemotherapy can affect healthy cells as well as cancer  cells. Report any side effects. Continue your course of treatment even though you feel ill unless your doctor tells you to stop. In some cases, you may be given additional medicines to help with side effects. Follow all directions for their use. Call your doctor or health care professional for advice if you get a fever, chills or sore throat, or other symptoms of a cold or flu. Do not treat yourself. This drug decreases your body's ability to fight infections. Try to avoid being around people who are sick. This medicine may increase your risk to bruise or bleed. Call your doctor or health care professional if you notice any unusual bleeding. Be careful brushing and flossing your teeth or using a toothpick because you may get an infection or bleed more easily. If you have any dental work done, tell your dentist you are receiving this medicine. Avoid taking products that contain aspirin, acetaminophen, ibuprofen, naproxen, or ketoprofen unless instructed by your doctor. These medicines may hide a fever. Do not become pregnant while taking this medicine. Women should inform their doctor if they wish to become pregnant or think they might be pregnant. There is a potential for serious side effects to an unborn child. Talk to your health care professional or pharmacist for more information. Do not breast-feed an infant while taking this medicine. Call your doctor or health care professional if you get diarrhea. Do not treat yourself. What side effects may I notice from receiving  this medicine? Side effects that you should report to your doctor or health care professional as soon as possible: -allergic reactions like skin rash, itching or hives, swelling of the face, lips, or tongue -low blood counts - This drug may decrease the number of white blood cells, red blood cells and platelets. You may be at increased risk for infections and bleeding. -signs of infection - fever or chills, cough, sore throat, pain or  difficulty passing urine -signs of decreased platelets or bleeding - bruising, pinpoint red spots on the skin, black, tarry stools, nosebleeds -signs of decreased red blood cells - unusually weak or tired, fainting spells, lightheadedness -breathing problems -chest pain, pressure -cough -diarrhea -jaw tightness -mouth sores -nausea and vomiting -pain, swelling, redness or irritation at the injection site -pain, tingling, numbness in the hands or feet -problems with balance, talking, walking -redness, blistering, peeling or loosening of the skin, including inside the mouth -trouble passing urine or change in the amount of urine Side effects that usually do not require medical attention (report to your doctor or health care professional if they continue or are bothersome): -changes in vision -constipation -hair loss -loss of appetite -metallic taste in the mouth or changes in taste -stomach pain This list may not describe all possible side effects. Call your doctor for medical advice about side effects. You may report side effects to FDA at 1-800-FDA-1088. Where should I keep my medicine? This drug is given in a hospital or clinic and will not be stored at home. NOTE: This sheet is a summary. It may not cover all possible information. If you have questions about this medicine, talk to your doctor, pharmacist, or health care provider.  2018 Elsevier/Gold Standard (2007-10-06 17:22:47) Irinotecan injection What is this medicine? IRINOTECAN (ir in oh TEE kan ) is a chemotherapy drug. It is used to treat colon and rectal cancer. This medicine may be used for other purposes; ask your health care provider or pharmacist if you have questions. COMMON BRAND NAME(S): Camptosar What should I tell my health care provider before I take this medicine? They need to know if you have any of these conditions: -blood disorders -dehydration -diarrhea -infection (especially a virus infection such as  chickenpox, cold sores, or herpes) -liver disease -low blood counts, like low white cell, platelet, or red cell counts -recent or ongoing radiation therapy -an unusual or allergic reaction to irinotecan, sorbitol, other chemotherapy, other medicines, foods, dyes, or preservatives -pregnant or trying to get pregnant -breast-feeding How should I use this medicine? This drug is given as an infusion into a vein. It is administered in a hospital or clinic by a specially trained health care professional. Talk to your pediatrician regarding the use of this medicine in children. Special care may be needed. Overdosage: If you think you have taken too much of this medicine contact a poison control center or emergency room at once. NOTE: This medicine is only for you. Do not share this medicine with others. What if I miss a dose? It is important not to miss your dose. Call your doctor or health care professional if you are unable to keep an appointment. What may interact with this medicine? Do not take this medicine with any of the following medications: -atazanavir -certain medicines for fungal infections like itraconazole and ketoconazole -St. John's Wort This medicine may also interact with the following medications: -dexamethasone -diuretics -laxatives -medicines for seizures like carbamazepine, mephobarbital, phenobarbital, phenytoin, primidone -medicines to increase blood counts like filgrastim, pegfilgrastim,  sargramostim -prochlorperazine -vaccines This list may not describe all possible interactions. Give your health care provider a list of all the medicines, herbs, non-prescription drugs, or dietary supplements you use. Also tell them if you smoke, drink alcohol, or use illegal drugs. Some items may interact with your medicine. What should I watch for while using this medicine? Your condition will be monitored carefully while you are receiving this medicine. You will need important blood  work done while you are taking this medicine. This drug may make you feel generally unwell. This is not uncommon, as chemotherapy can affect healthy cells as well as cancer cells. Report any side effects. Continue your course of treatment even though you feel ill unless your doctor tells you to stop. In some cases, you may be given additional medicines to help with side effects. Follow all directions for their use. You may get drowsy or dizzy. Do not drive, use machinery, or do anything that needs mental alertness until you know how this medicine affects you. Do not stand or sit up quickly, especially if you are an older patient. This reduces the risk of dizzy or fainting spells. Call your doctor or health care professional for advice if you get a fever, chills or sore throat, or other symptoms of a cold or flu. Do not treat yourself. This drug decreases your body's ability to fight infections. Try to avoid being around people who are sick. This medicine may increase your risk to bruise or bleed. Call your doctor or health care professional if you notice any unusual bleeding. Be careful brushing and flossing your teeth or using a toothpick because you may get an infection or bleed more easily. If you have any dental work done, tell your dentist you are receiving this medicine. Avoid taking products that contain aspirin, acetaminophen, ibuprofen, naproxen, or ketoprofen unless instructed by your doctor. These medicines may hide a fever. Do not become pregnant while taking this medicine. Women should inform their doctor if they wish to become pregnant or think they might be pregnant. There is a potential for serious side effects to an unborn child. Talk to your health care professional or pharmacist for more information. Do not breast-feed an infant while taking this medicine. What side effects may I notice from receiving this medicine? Side effects that you should report to your doctor or health care  professional as soon as possible: -allergic reactions like skin rash, itching or hives, swelling of the face, lips, or tongue -low blood counts - this medicine may decrease the number of white blood cells, red blood cells and platelets. You may be at increased risk for infections and bleeding. -signs of infection - fever or chills, cough, sore throat, pain or difficulty passing urine -signs of decreased platelets or bleeding - bruising, pinpoint red spots on the skin, black, tarry stools, blood in the urine -signs of decreased red blood cells - unusually weak or tired, fainting spells, lightheadedness -breathing problems -chest pain -diarrhea -feeling faint or lightheaded, falls -flushing, runny nose, sweating during infusion -mouth sores or pain -pain, swelling, redness or irritation where injected -pain, swelling, warmth in the leg -pain, tingling, numbness in the hands or feet -problems with balance, talking, walking -stomach cramps, pain -trouble passing urine or change in the amount of urine -vomiting as to be unable to hold down drinks or food -yellowing of the eyes or skin Side effects that usually do not require medical attention (report to your doctor or health care professional if they  continue or are bothersome): -constipation -hair loss -headache -loss of appetite -nausea, vomiting -stomach upset This list may not describe all possible side effects. Call your doctor for medical advice about side effects. You may report side effects to FDA at 1-800-FDA-1088. Where should I keep my medicine? This drug is given in a hospital or clinic and will not be stored at home. NOTE: This sheet is a summary. It may not cover all possible information. If you have questions about this medicine, talk to your doctor, pharmacist, or health care provider.  2018 Elsevier/Gold Standard (2012-09-07 16:29:32)

## 2016-11-12 NOTE — Patient Instructions (Signed)
Implanted Port Home Guide An implanted port is a type of central line that is placed under the skin. Central lines are used to provide IV access when treatment or nutrition needs to be given through a person's veins. Implanted ports are used for long-term IV access. An implanted port may be placed because:  You need IV medicine that would be irritating to the small veins in your hands or arms.  You need long-term IV medicines, such as antibiotics.  You need IV nutrition for a long period.  You need frequent blood draws for lab tests.  You need dialysis.  Implanted ports are usually placed in the chest area, but they can also be placed in the upper arm, the abdomen, or the leg. An implanted port has two main parts:  Reservoir. The reservoir is round and will appear as a small, raised area under your skin. The reservoir is the part where a needle is inserted to give medicines or draw blood.  Catheter. The catheter is a thin, flexible tube that extends from the reservoir. The catheter is placed into a large vein. Medicine that is inserted into the reservoir goes into the catheter and then into the vein.  How will I care for my incision site? Do not get the incision site wet. Bathe or shower as directed by your health care provider. How is my port accessed? Special steps must be taken to access the port:  Before the port is accessed, a numbing cream can be placed on the skin. This helps numb the skin over the port site.  Your health care provider uses a sterile technique to access the port. ? Your health care provider must put on a mask and sterile gloves. ? The skin over your port is cleaned carefully with an antiseptic and allowed to dry. ? The port is gently pinched between sterile gloves, and a needle is inserted into the port.  Only "non-coring" port needles should be used to access the port. Once the port is accessed, a blood return should be checked. This helps ensure that the port  is in the vein and is not clogged.  If your port needs to remain accessed for a constant infusion, a clear (transparent) bandage will be placed over the needle site. The bandage and needle will need to be changed every week, or as directed by your health care provider.  Keep the bandage covering the needle clean and dry. Do not get it wet. Follow your health care provider's instructions on how to take a shower or bath while the port is accessed.  If your port does not need to stay accessed, no bandage is needed over the port.  What is flushing? Flushing helps keep the port from getting clogged. Follow your health care provider's instructions on how and when to flush the port. Ports are usually flushed with saline solution or a medicine called heparin. The need for flushing will depend on how the port is used.  If the port is used for intermittent medicines or blood draws, the port will need to be flushed: ? After medicines have been given. ? After blood has been drawn. ? As part of routine maintenance.  If a constant infusion is running, the port may not need to be flushed.  How long will my port stay implanted? The port can stay in for as long as your health care provider thinks it is needed. When it is time for the port to come out, surgery will be   done to remove it. The procedure is similar to the one performed when the port was put in. When should I seek immediate medical care? When you have an implanted port, you should seek immediate medical care if:  You notice a bad smell coming from the incision site.  You have swelling, redness, or drainage at the incision site.  You have more swelling or pain at the port site or the surrounding area.  You have a fever that is not controlled with medicine.  This information is not intended to replace advice given to you by your health care provider. Make sure you discuss any questions you have with your health care provider. Document  Released: 03/11/2005 Document Revised: 08/17/2015 Document Reviewed: 11/16/2012 Elsevier Interactive Patient Education  2017 [employer]  

## 2016-11-12 NOTE — Progress Notes (Signed)
Hematology and Oncology Follow Up Visit  Carla Mills [id number] Feb 08, 1971 46 y.o. 11/12/2016   Principle Diagnosis:   Recurrent colon cancer - HER2 (-)Zachery Dakins stable/MMR normal/BRAF -  Current Therapy:     FOLFOXIRI on 04/09/2016 - s/p cycle #11  Status post exploratory laparotomy with HIPEC - 07/29/2016     Interim History:  Carla Mills is back for follow-up. She's done so well with treatment. She's had no abdominal pain. She's had no nausea or vomiting. His been no diarrhea. She's had no bleeding. She's had no cough.  Her last CEA was 2.1. This is holding stable.  She's had no fever. There's been no mouth sores.  Hopefully, in September, she and her husband will be going over to [place]. 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 They like going over there in the fall.  She's had no leg swelling. She's had no rashes.  Overall, her performance status is ECOG 0.  Medications:  Current Outpatient Prescriptions:  .  acetaminophen (TYLENOL) 500 MG tablet, Take 1,000 mg by mouth every 8 (eight) hours as needed for moderate pain. , Disp: , Rfl:  .  dexamethasone (DECADRON) 4 MG tablet, Take 2 tablets (8 mg total) by mouth daily. Start the day after chemo and take for 2 days., Disp: 24 tablet, Rfl: 0 .  fexofenadine (ALLEGRA) 180 MG tablet, Take 180 mg by mouth daily as needed for allergies. , Disp: , Rfl:  .  fluticasone (FLONASE) 50 MCG/ACT nasal spray, Place 1 spray into both nostrils 2 (two) times daily. , Disp: , Rfl:  .  HYDROcodone-acetaminophen (NORCO/VICODIN) 5-325 MG tablet, Take 1-2 tablets by mouth every 6 (six) hours as needed., Disp: 15 tablet, Rfl: 0 .  ibuprofen (ADVIL,MOTRIN) 200 MG tablet, Take 400 mg by mouth every 6 (six) hours as needed for headache or mild pain., Disp: , Rfl:  .  lidocaine-prilocaine (EMLA) cream, Apply 1 application topically as needed. Apply to [facility] 1 hour prior to treatment., Disp: 30 g, Rfl: 3 .  loperamide (IMODIUM) 2 MG capsule, Take 1  capsule (2 mg total) by mouth as needed for diarrhea or loose stools. Take 2 capsules at the onset of diarrhea, then 1 every 2 hours until 12 hours with no BM. May take 2 every 4 hours at night. If diarrhea recurrs, repeat., Disp: 100 capsule, Rfl: 0 .  LORazepam (ATIVAN) 1 MG tablet, Take 1 tablet (1 mg total) by mouth every 6 (six) hours as needed for anxiety. Prn nausea/vomiting, Disp: 30 tablet, Rfl: 0 .  magic mouthwash w/lidocaine SOLN, Take 5 mLs by mouth 3 (three) times daily as needed for mouth pain., Disp: 120 mL, Rfl: 1 .  neomycin (MYCIFRADIN) 500 MG tablet, Take two tablets at 1:00pm, 2:00pm and 11:00pm the night before surgery., Disp: , Rfl:  .  nystatin (MYCOSTATIN) 100000 UNIT/ML suspension, 100,000 Units., Disp: , Rfl: 0 .  ondansetron (ZOFRAN) 8 MG tablet, Take 1 tablet (8 mg total) by mouth every 8 (eight) hours as needed for nausea or vomiting. Start Day 3 after chemo., Disp: 20 tablet, Rfl: 0 .  prochlorperazine (COMPAZINE) 10 MG tablet, Take 1 tablet (10 mg total) by mouth every 6 (six) hours as needed for nausea or vomiting., Disp: 30 tablet, Rfl: 1 .  promethazine (PHENERGAN) 25 MG tablet, Take 1 tablet (25 mg total) by mouth every 6 (six) hours as needed for nausea or vomiting., Disp: 12 tablet, Rfl: 0 .  traMADol (ULTRAM) 50 MG tablet, Take 1 tablet (50 mg  total) by mouth every 6 (six) hours as needed. (Patient taking differently: Take 50 mg by mouth every 6 (six) hours as needed for moderate pain. ), Disp: 90 tablet, Rfl: 0 No current facility-administered medications for this visit.   Facility-Administered Medications Ordered in Other Visits:  .  clindamycin (CLEOCIN) 900 mg in dextrose 5 % 50 mL IVPB, 900 mg, Intravenous, 60 min Pre-Op **AND** gentamicin (GARAMYCIN) 340 mg in dextrose 5 % 50 mL IVPB, 5 mg/kg, Intravenous, 60 min Pre-Op, [name], MD .  dextrose 5 % solution, , Intravenous, Continuous, Keelan Pomerleau, Rudell Cobb, MD, Stopped at 04/09/16 1418 .  heparin lock  flush 100 unit/mL, 500 Units, Intracatheter, Once PRN, Volanda Napoleon, MD .  sodium chloride flush (NS) 0.9 % injection 10 mL, 10 mL, Intracatheter, PRN, Volanda Napoleon, MD .  sodium chloride flush (NS) 0.9 % injection 10 mL, 10 mL, Intracatheter, PRN, Volanda Napoleon, MD, 10 mL at 10/31/16 1420  Allergies:  Allergies  Allergen Reactions  . Bactrim [Sulfamethoxazole-Trimethoprim]   . Oxaprozin Hives    REACTION: Hives  . Penicillins Hives    REACTION: Hives Has patient had a PCN reaction causing immediate rash, facial/tongue/throat swelling, SOB or lightheadedness with hypotension: no Has patient had a PCN reaction causing severe rash involving mucus membranes or skin necrosis: {no Has patient had a PCN reaction that required hospitalization no Has patient had a PCN reaction occurring within the last 10 years: no If all of the above answers are "NO", then may proceed with Cephalosporin use.  . Sulfonamide Derivatives Swelling    Massive extremity swelling     Past Medical History, Surgical history, Social history, and Family History were reviewed and updated.  Review of Systems:  As above  Physical Exam:  weight is 169 lb 6.4 oz (76.8 kg). Her oral temperature is 98.1 F (36.7 C). Her blood pressure is 115/80 and her pulse is 79. Her respiration is 17 and oxygen saturation is 98%.   Wt Readings from Last 3 Encounters:  11/12/16 169 lb 6.4 oz (76.8 kg)  10/29/16 168 lb 1.3 oz (76.2 kg)  10/15/16 169 lb (76.7 kg)      Well-developed and well-nourished white female in no obvious distress. Head and neck exam shows no ocular or oral lesions. She has no palpable cervical or supraclavicular lymph nodes. Lungs are clear. Cardiac exam regular rate and rhythm with no murmurs, rubs or bruits. Abdomen is soft. She has a dressing over the laparotomy wound. There is no fluid wave. She has no guarding or rebound tenderness. She has no liver or spleen tip. Her laparoscopy scars are healing.   Back exam shows no tenderness over the spine, ribs or hips. Extremities shows no clubbing, cyanosis or edema. She's good range motion of her joints. Skin exam shows no rashes, ecchymoses or petechia. Neurological exam shows no focal neurological deficits.  Lab Results  Component Value Date   WBC 4.5 11/12/2016   HGB 12.7 11/12/2016   HCT 37.1 11/12/2016   MCV 88 11/12/2016   PLT 202 11/12/2016     Chemistry      Component Value Date/Time   NA 144 11/12/2016 0826   NA 141 07/30/2016 0750   K 4.1 11/12/2016 0826   K 4.2 07/30/2016 0750   CL 107 11/12/2016 0826   CO2 28 11/12/2016 0826   CO2 26 07/30/2016 0750   BUN 16 11/12/2016 0826   BUN 12.1 07/30/2016 0750   CREATININE 0.9 11/12/2016 0826  CREATININE 0.7 07/30/2016 0750      Component Value Date/Time   CALCIUM 9.5 11/12/2016 0826   CALCIUM 9.5 07/30/2016 0750   ALKPHOS 104 (H) 11/12/2016 0826   ALKPHOS 72 07/30/2016 0750   AST 52 (H) 11/12/2016 0826   AST 22 07/30/2016 0750   ALT 56 (H) 11/12/2016 0826   ALT 22 07/30/2016 0750   BILITOT 0.90 11/12/2016 0826   BILITOT 0.54 07/30/2016 0750         Impression and Plan: Ms. Stallone is a 46 year old white female. She initially presented with stage II colon cancer back in I think December 2016. She had a very high Oncotype score of 37. Because of this, we went ahead and gave her adjuvant chemotherapy with Xeloda. Despite this, her cancer has recurred.  I am incredibly pleased that she is done so well. Her response to chemotherapy has been fantastic.  Her liver tests are slightly elevated. I will continue treating her. We will just have to monitor these.  This we are final cycle of treatment. After this, we will do active surveillance. We will get her set up with CT scans. I will set these up in early October. We will wait until after she gets back from her vacation.   As far as our monitoring frequency, I will see her every 3 months for the first year. I will then  see her every 4 months the second year and every 6 months afterwards.   I think that we will know whether not she will recur by her CEA level.   I'm just so pleased that her quality of life has been so good.  Volanda Napoleon, MD 8/21/20189:23 AM

## 2016-11-14 ENCOUNTER — Ambulatory Visit (HOSPITAL_BASED_OUTPATIENT_CLINIC_OR_DEPARTMENT_OTHER): Payer: BLUE CROSS/BLUE SHIELD

## 2016-11-14 DIAGNOSIS — C189 Malignant neoplasm of colon, unspecified: Secondary | ICD-10-CM

## 2016-11-14 DIAGNOSIS — C184 Malignant neoplasm of transverse colon: Secondary | ICD-10-CM

## 2016-11-14 DIAGNOSIS — Z452 Encounter for adjustment and management of vascular access device: Secondary | ICD-10-CM | POA: Diagnosis not present

## 2016-11-14 MED ORDER — SODIUM CHLORIDE 0.9% FLUSH
10.0000 mL | INTRAVENOUS | Status: DC | PRN
  Administered 2016-11-14: 10 mL via INTRAVENOUS
  Filled 2016-11-14: qty 10

## 2016-11-14 MED ORDER — HEPARIN SOD (PORK) LOCK FLUSH 100 UNIT/ML IV SOLN
500.0000 [IU] | Freq: Once | INTRAVENOUS | Status: AC
  Administered 2016-11-14: 500 [IU] via INTRAVENOUS
  Filled 2016-11-14: qty 5

## 2016-12-24 ENCOUNTER — Ambulatory Visit: Payer: BLUE CROSS/BLUE SHIELD

## 2016-12-24 ENCOUNTER — Other Ambulatory Visit: Payer: BLUE CROSS/BLUE SHIELD

## 2016-12-24 ENCOUNTER — Encounter (HOSPITAL_BASED_OUTPATIENT_CLINIC_OR_DEPARTMENT_OTHER): Payer: Self-pay

## 2016-12-24 ENCOUNTER — Ambulatory Visit (HOSPITAL_BASED_OUTPATIENT_CLINIC_OR_DEPARTMENT_OTHER)
Admission: RE | Admit: 2016-12-24 | Discharge: 2016-12-24 | Disposition: A | Discharge status: Home / Self Care | Payer: BLUE CROSS/BLUE SHIELD | Source: Ambulatory Visit | Attending: Hematology & Oncology | Admitting: Hematology & Oncology

## 2016-12-24 ENCOUNTER — Other Ambulatory Visit (HOSPITAL_BASED_OUTPATIENT_CLINIC_OR_DEPARTMENT_OTHER): Payer: BLUE CROSS/BLUE SHIELD

## 2016-12-24 ENCOUNTER — Ambulatory Visit (HOSPITAL_BASED_OUTPATIENT_CLINIC_OR_DEPARTMENT_OTHER): Payer: BLUE CROSS/BLUE SHIELD | Admitting: Hematology & Oncology

## 2016-12-24 VITALS — BP 118/74 | HR 74 | Temp 98.0°F | Resp 16 | Wt 170.0 lb

## 2016-12-24 DIAGNOSIS — N951 Menopausal and female climacteric states: Secondary | ICD-10-CM

## 2016-12-24 DIAGNOSIS — C189 Malignant neoplasm of colon, unspecified: Secondary | ICD-10-CM | POA: Diagnosis not present

## 2016-12-24 DIAGNOSIS — C772 Secondary and unspecified malignant neoplasm of intra-abdominal lymph nodes: Secondary | ICD-10-CM | POA: Insufficient documentation

## 2016-12-24 DIAGNOSIS — Z85118 Personal history of other malignant neoplasm of bronchus and lung: Secondary | ICD-10-CM | POA: Diagnosis not present

## 2016-12-24 DIAGNOSIS — C187 Malignant neoplasm of sigmoid colon: Secondary | ICD-10-CM

## 2016-12-24 DIAGNOSIS — R918 Other nonspecific abnormal finding of lung field: Secondary | ICD-10-CM | POA: Diagnosis not present

## 2016-12-24 LAB — CBC WITH DIFFERENTIAL (CANCER CENTER ONLY)
BASO#: 0 10*3/uL (ref 0.0–0.2)
BASO%: 0.2 % (ref 0.0–2.0)
EOS ABS: 0.1 10*3/uL (ref 0.0–0.5)
EOS%: 2.9 % (ref 0.0–7.0)
HCT: 39.2 % (ref 34.8–46.6)
HEMOGLOBIN: 13.5 g/dL (ref 11.6–15.9)
LYMPH#: 1.4 10*3/uL (ref 0.9–3.3)
LYMPH%: 28.7 % (ref 14.0–48.0)
MCH: 30.7 pg (ref 26.0–34.0)
MCHC: 34.4 g/dL (ref 32.0–36.0)
MCV: 89 fL (ref 81–101)
MONO#: 0.5 10*3/uL (ref 0.1–0.9)
MONO%: 10.9 % (ref 0.0–13.0)
NEUT%: 57.3 % (ref 39.6–80.0)
NEUTROS ABS: 2.8 10*3/uL (ref 1.5–6.5)
Platelets: 242 10*3/uL (ref 145–400)
RBC: 4.4 10*6/uL (ref 3.70–5.32)
RDW: 14.9 % (ref 11.1–15.7)
WBC: 4.9 10*3/uL (ref 3.9–10.0)

## 2016-12-24 LAB — CMP (CANCER CENTER ONLY)
ALBUMIN: 3.9 g/dL (ref 3.3–5.5)
ALT(SGPT): 31 U/L (ref 10–47)
AST: 29 U/L (ref 11–38)
Alkaline Phosphatase: 89 U/L — ABNORMAL HIGH (ref 26–84)
BUN, Bld: 13 mg/dL (ref 7–22)
CALCIUM: 9.4 mg/dL (ref 8.0–10.3)
CHLORIDE: 107 meq/L (ref 98–108)
CO2: 28 mEq/L (ref 18–33)
Creat: 0.9 mg/dl (ref 0.6–1.2)
Glucose, Bld: 100 mg/dL (ref 73–118)
POTASSIUM: 3.8 meq/L (ref 3.3–4.7)
Sodium: 142 mEq/L (ref 128–145)
TOTAL PROTEIN: 6.9 g/dL (ref 6.4–8.1)
Total Bilirubin: 0.9 mg/dl (ref 0.20–1.60)

## 2016-12-24 LAB — LACTATE DEHYDROGENASE: LDH: 230 U/L (ref 125–245)

## 2016-12-24 LAB — CEA (IN HOUSE-CHCC): CEA (CHCC-IN HOUSE): 2.53 ng/mL (ref 0.00–5.00)

## 2016-12-24 MED ORDER — IOPAMIDOL (ISOVUE-300) INJECTION 61%
100.0000 mL | Freq: Once | INTRAVENOUS | Status: AC | PRN
  Administered 2016-12-24: 100 mL via INTRAVENOUS

## 2016-12-24 MED ORDER — SODIUM CHLORIDE 0.9 % IJ SOLN
10.0000 mL | Freq: Once | INTRAMUSCULAR | Status: AC
  Administered 2016-12-24: 10 mL
  Filled 2016-12-24: qty 10

## 2016-12-24 MED ORDER — MEGESTROL ACETATE 20 MG PO TABS: 20.0000 mg | ORAL_TABLET | Freq: Every day | ORAL | 4 refills | Status: DC

## 2016-12-24 MED FILL — MEGESTROL 20 MG TABLET: 20 | 30 days supply | Qty: 30 | Fill #0

## 2016-12-24 NOTE — Progress Notes (Signed)
Hematology and Oncology Follow Up Visit  Jalesia Loudenslager [id number] [birth year] 46 y.o. 12/24/2016   Principle Diagnosis:   Recurrent colon cancer - HER2 (-)Zachery Dakins stable/MMR normal/BRAF -  Current Therapy:     FOLFOXIRI - s/p cycle #12 - completed on 11/12/2016  Status post exploratory laparotomy with HIPEC - 07/29/2016     Interim History:  Ms. Portee is back for follow-up. She looks great. She feels good. She and her husband just got back from [place]. He has the [employer] over in [place]. He is always going over there. She went along this time. They were over there for about 10 days. They had a great time. She ate well. She had no fatigue. She had no pain. She had no problems with bowels or bladder.   We went ahead and did a CT scan on her. Thankfully, the CT scan did not show any evidence of recurrent disease.   Her last CEA level from today actually was 2.53. This is normal.   She's had no problems with abdominal pain. There is no cough. She's had no problems with bowels or bladder. She's had no diarrhea.  There's been no leg swelling. She's had no rashes.  She is nice and 10. One of her daughters plays golf in college. She had a determined yesterday and Ms.Paulding was watching her.   Overall, her performance status is ECOG 0.    Medications:  Current Outpatient Prescriptions:  .  acetaminophen (TYLENOL) 500 MG tablet, Take 1,000 mg by mouth every 8 (eight) hours as needed for moderate pain. , Disp: , Rfl:  .  fexofenadine (ALLEGRA) 180 MG tablet, Take 180 mg by mouth daily as needed for allergies. , Disp: , Rfl:  .  fluticasone (FLONASE) 50 MCG/ACT nasal spray, Place 1 spray into both nostrils 2 (two) times daily. , Disp: , Rfl:  .  HYDROcodone-acetaminophen (NORCO/VICODIN) 5-325 MG tablet, Take 1-2 tablets by mouth every 6 (six) hours as needed., Disp: 15 tablet, Rfl: 0 .  ibuprofen (ADVIL,MOTRIN) 200 MG tablet, Take 400 mg by mouth every 6 (six) hours as  needed for headache or mild pain., Disp: , Rfl:  .  lidocaine-prilocaine (EMLA) cream, Apply 1 application topically as needed. Apply to [facility] 1 hour prior to treatment., Disp: 30 g, Rfl: 3 .  loperamide (IMODIUM) 2 MG capsule, Take 1 capsule (2 mg total) by mouth as needed for diarrhea or loose stools. Take 2 capsules at the onset of diarrhea, then 1 every 2 hours until 12 hours with no BM. May take 2 every 4 hours at night. If diarrhea recurrs, repeat., Disp: 100 capsule, Rfl: 0 .  magic mouthwash w/lidocaine SOLN, Take 5 mLs by mouth 3 (three) times daily as needed for mouth pain., Disp: 120 mL, Rfl: 1 .  megestrol (MEGACE) 20 MG tablet, Take 1 tablet (20 mg total) by mouth daily., Disp: 30 tablet, Rfl: 4 .  neomycin (MYCIFRADIN) 500 MG tablet, Take two tablets at 1:00pm, 2:00pm and 11:00pm the night before surgery., Disp: , Rfl:  .  nystatin (MYCOSTATIN) 100000 UNIT/ML suspension, 100,000 Units., Disp: , Rfl: 0 .  ondansetron (ZOFRAN) 8 MG tablet, Take 1 tablet (8 mg total) by mouth every 8 (eight) hours as needed for nausea or vomiting. Start Day 3 after chemo., Disp: 20 tablet, Rfl: 0 .  traMADol (ULTRAM) 50 MG tablet, Take 1 tablet (50 mg total) by mouth every 6 (six) hours as needed. (Patient taking differently: Take 50 mg by mouth every 6 (  six) hours as needed for moderate pain. ), Disp: 90 tablet, Rfl: 0 No current facility-administered medications for this visit.   Facility-Administered Medications Ordered in Other Visits:  .  clindamycin (CLEOCIN) 900 mg in dextrose 5 % 50 mL IVPB, 900 mg, Intravenous, 60 min Pre-Op **AND** gentamicin (GARAMYCIN) 340 mg in dextrose 5 % 50 mL IVPB, 5 mg/kg, Intravenous, 60 min Pre-Op, [name], MD .  dextrose 5 % solution, , Intravenous, Continuous, Emory Gallentine, Rudell Cobb, MD, Stopped at 04/09/16 1418 .  heparin lock flush 100 unit/mL, 500 Units, Intracatheter, Once PRN, Volanda Napoleon, MD .  sodium chloride flush (NS) 0.9 % injection 10 mL, 10 mL,  Intracatheter, PRN, Volanda Napoleon, MD .  sodium chloride flush (NS) 0.9 % injection 10 mL, 10 mL, Intracatheter, PRN, Volanda Napoleon, MD, 10 mL at 10/31/16 1420  Allergies:  Allergies  Allergen Reactions  . Bactrim [Sulfamethoxazole-Trimethoprim]   . Oxaprozin Hives    REACTION: Hives  . Penicillins Hives    REACTION: Hives Has patient had a PCN reaction causing immediate rash, facial/tongue/throat swelling, SOB or lightheadedness with hypotension: no Has patient had a PCN reaction causing severe rash involving mucus membranes or skin necrosis: {no Has patient had a PCN reaction that required hospitalization no Has patient had a PCN reaction occurring within the last 10 years: no If all of the above answers are "NO", then may proceed with Cephalosporin use.  . Sulfonamide Derivatives Swelling    Massive extremity swelling     Past Medical History, Surgical history, Social history, and Family History were reviewed and updated.  Review of Systems:  As stated in the interim history  Physical Exam:  weight is 170 lb (77.1 kg). Her oral temperature is 98 F (36.7 C). Her blood pressure is 118/74 and her pulse is 74. Her respiration is 16 and oxygen saturation is 100%.   Wt Readings from Last 3 Encounters:  12/24/16 170 lb (77.1 kg)  11/12/16 169 lb 6.4 oz (76.8 kg)  10/29/16 168 lb 1.3 oz (76.2 kg)      Well-developed and well-nourished white female. Head and neck exam shows no ocular or oral lesions. There are no palpable cervical or supraclavicular lymph nodes. Lungs are clear. Cardiac exam regular rate and rhythm with no murmurs, rubs or bruits. Abdomen is soft. She has laparotomy scars. These are well-healed. There is no fluid wave. There is no mass. There is no guarding or rebound tenderness. There is no palpable liver or spleen tip. There is no inguinal adenopathy. Back exam shows no tenderness over the spine, ribs or hips. Extremities shows no clubbing, cyanosis or edema.  Skin exam shows no rashes, ecchymoses or petechia. Neurological exam is nonfocal.  Lab Results  Component Value Date   WBC 4.9 12/24/2016   HGB 13.5 12/24/2016   HCT 39.2 12/24/2016   MCV 89 12/24/2016   PLT 242 12/24/2016     Chemistry      Component Value Date/Time   NA 142 12/24/2016 0743   NA 141 07/30/2016 0750   K 3.8 12/24/2016 0743   K 4.2 07/30/2016 0750   CL 107 12/24/2016 0743   CO2 28 12/24/2016 0743   CO2 26 07/30/2016 0750   BUN 13 12/24/2016 0743   BUN 12.1 07/30/2016 0750   CREATININE 0.9 12/24/2016 0743   CREATININE 0.7 07/30/2016 0750      Component Value Date/Time   CALCIUM 9.4 12/24/2016 0743   CALCIUM 9.5 07/30/2016 0750  ALKPHOS 89 (H) 12/24/2016 0743   ALKPHOS 72 07/30/2016 0750   AST 29 12/24/2016 0743   AST 22 07/30/2016 0750   ALT 31 12/24/2016 0743   ALT 22 07/30/2016 0750   BILITOT 0.90 12/24/2016 0743   BILITOT 0.54 07/30/2016 0750         Impression and Plan: Ms. Boeder is a 46 year old white female. She initially presented with stage II colon cancer back in I think December 2016. She had a very high Oncotype score of 37. Because of this, we went ahead and gave her adjuvant chemotherapy with Xeloda. Despite this, her cancer has recurred.  At this point, we now have to do active surveillance. The CT scan today looks fantastic.  She will go see Dr. Crisoforo Oxford at [facility] on December 18. He will do a CT scan on her at that visit. As such, we do not have to order another CT scan on her probably for 6 months.  I will like to see her back a couple days after that visit at [facility] so we can flush her Port-A-Cath and just see how she is doing.   I talked to her about surveillance. I told her that she definitely is at risk for recurrent disease.  We will know this is with her CT scans. Her CEA really has not been all that elevated but I suppose that it could elevate if she has recurrent.  I will plan to see her back in 3 months.  I spent about  30 minutes with her today. We reviewed the labs and CT scan. I went over my recommendations for her surveillance. She agrees.   Volanda Napoleon, MD 10/2/201811:44 AM

## 2016-12-24 NOTE — Patient Instructions (Signed)
Implanted Port Home Guide An implanted port is a type of central line that is placed under the skin. Central lines are used to provide IV access when treatment or nutrition needs to be given through a person's veins. Implanted ports are used for long-term IV access. An implanted port may be placed because:  You need IV medicine that would be irritating to the small veins in your hands or arms.  You need long-term IV medicines, such as antibiotics.  You need IV nutrition for a long period.  You need frequent blood draws for lab tests.  You need dialysis.  Implanted ports are usually placed in the chest area, but they can also be placed in the upper arm, the abdomen, or the leg. An implanted port has two main parts:  Reservoir. The reservoir is round and will appear as a small, raised area under your skin. The reservoir is the part where a needle is inserted to give medicines or draw blood.  Catheter. The catheter is a thin, flexible tube that extends from the reservoir. The catheter is placed into a large vein. Medicine that is inserted into the reservoir goes into the catheter and then into the vein.  How will I care for my incision site? Do not get the incision site wet. Bathe or shower as directed by your health care provider. How is my port accessed? Special steps must be taken to access the port:  Before the port is accessed, a numbing cream can be placed on the skin. This helps numb the skin over the port site.  Your health care provider uses a sterile technique to access the port. ? Your health care provider must put on a mask and sterile gloves. ? The skin over your port is cleaned carefully with an antiseptic and allowed to dry. ? The port is gently pinched between sterile gloves, and a needle is inserted into the port.  Only "non-coring" port needles should be used to access the port. Once the port is accessed, a blood return should be checked. This helps ensure that the port  is in the vein and is not clogged.  If your port needs to remain accessed for a constant infusion, a clear (transparent) bandage will be placed over the needle site. The bandage and needle will need to be changed every week, or as directed by your health care provider.  Keep the bandage covering the needle clean and dry. Do not get it wet. Follow your health care provider's instructions on how to take a shower or bath while the port is accessed.  If your port does not need to stay accessed, no bandage is needed over the port.  What is flushing? Flushing helps keep the port from getting clogged. Follow your health care provider's instructions on how and when to flush the port. Ports are usually flushed with saline solution or a medicine called heparin. The need for flushing will depend on how the port is used.  If the port is used for intermittent medicines or blood draws, the port will need to be flushed: ? After medicines have been given. ? After blood has been drawn. ? As part of routine maintenance.  If a constant infusion is running, the port may not need to be flushed.  How long will my port stay implanted? The port can stay in for as long as your health care provider thinks it is needed. When it is time for the port to come out, surgery will be   done to remove it. The procedure is similar to the one performed when the port was put in. When should I seek immediate medical care? When you have an implanted port, you should seek immediate medical care if:  You notice a bad smell coming from the incision site.  You have swelling, redness, or drainage at the incision site.  You have more swelling or pain at the port site or the surrounding area.  You have a fever that is not controlled with medicine.  This information is not intended to replace advice given to you by your health care provider. Make sure you discuss any questions you have with your health care provider. Document  Released: 03/11/2005 Document Revised: 08/17/2015 Document Reviewed: 11/16/2012 Elsevier Interactive Patient Education  2017 [employer]  

## 2017-02-04 ENCOUNTER — Other Ambulatory Visit: Payer: Self-pay

## 2017-02-04 ENCOUNTER — Ambulatory Visit (HOSPITAL_BASED_OUTPATIENT_CLINIC_OR_DEPARTMENT_OTHER): Payer: BLUE CROSS/BLUE SHIELD

## 2017-02-04 VITALS — BP 104/67 | HR 81 | Temp 98.2°F | Resp 18

## 2017-02-04 DIAGNOSIS — Z95828 Presence of other vascular implants and grafts: Secondary | ICD-10-CM

## 2017-02-04 DIAGNOSIS — Z23 Encounter for immunization: Secondary | ICD-10-CM

## 2017-02-04 MED ORDER — HEPARIN SOD (PORK) LOCK FLUSH 100 UNIT/ML IV SOLN
500.0000 [IU] | Freq: Once | INTRAVENOUS | Status: AC
  Administered 2017-02-04: 500 [IU] via INTRAVENOUS
  Filled 2017-02-04: qty 5

## 2017-02-04 MED ORDER — INFLUENZA VAC SPLIT QUAD 0.5 ML IM SUSY
PREFILLED_SYRINGE | INTRAMUSCULAR | Status: AC
  Filled 2017-02-04: qty 0.5

## 2017-02-04 MED ORDER — INFLUENZA VAC SPLIT QUAD 0.5 ML IM SUSY
0.5000 mL | PREFILLED_SYRINGE | Freq: Once | INTRAMUSCULAR | Status: AC
  Administered 2017-02-04: 0.5 mL via INTRAMUSCULAR

## 2017-02-04 MED ORDER — SODIUM CHLORIDE 0.9% FLUSH
10.0000 mL | INTRAVENOUS | Status: DC | PRN
  Administered 2017-02-04: 10 mL via INTRAVENOUS
  Filled 2017-02-04: qty 10

## 2017-02-04 NOTE — Patient Instructions (Signed)
Implanted Port Home Guide An implanted port is a type of central line that is placed under the skin. Central lines are used to provide IV access when treatment or nutrition needs to be given through a person's veins. Implanted ports are used for long-term IV access. An implanted port may be placed because:  You need IV medicine that would be irritating to the small veins in your hands or arms.  You need long-term IV medicines, such as antibiotics.  You need IV nutrition for a long period.  You need frequent blood draws for lab tests.  You need dialysis.  Implanted ports are usually placed in the chest area, but they can also be placed in the upper arm, the abdomen, or the leg. An implanted port has two main parts:  Reservoir. The reservoir is round and will appear as a small, raised area under your skin. The reservoir is the part where a needle is inserted to give medicines or draw blood.  Catheter. The catheter is a thin, flexible tube that extends from the reservoir. The catheter is placed into a large vein. Medicine that is inserted into the reservoir goes into the catheter and then into the vein.  How will I care for my incision site? Do not get the incision site wet. Bathe or shower as directed by your health care provider. How is my port accessed? Special steps must be taken to access the port:  Before the port is accessed, a numbing cream can be placed on the skin. This helps numb the skin over the port site.  Your health care provider uses a sterile technique to access the port. ? Your health care provider must put on a mask and sterile gloves. ? The skin over your port is cleaned carefully with an antiseptic and allowed to dry. ? The port is gently pinched between sterile gloves, and a needle is inserted into the port.  Only "non-coring" port needles should be used to access the port. Once the port is accessed, a blood return should be checked. This helps ensure that the port  is in the vein and is not clogged.  If your port needs to remain accessed for a constant infusion, a clear (transparent) bandage will be placed over the needle site. The bandage and needle will need to be changed every week, or as directed by your health care provider.  Keep the bandage covering the needle clean and dry. Do not get it wet. Follow your health care provider's instructions on how to take a shower or bath while the port is accessed.  If your port does not need to stay accessed, no bandage is needed over the port.  What is flushing? Flushing helps keep the port from getting clogged. Follow your health care provider's instructions on how and when to flush the port. Ports are usually flushed with saline solution or a medicine called heparin. The need for flushing will depend on how the port is used.  If the port is used for intermittent medicines or blood draws, the port will need to be flushed: ? After medicines have been given. ? After blood has been drawn. ? As part of routine maintenance.  If a constant infusion is running, the port may not need to be flushed.  How long will my port stay implanted? The port can stay in for as long as your health care provider thinks it is needed. When it is time for the port to come out, surgery will be   done to remove it. The procedure is similar to the one performed when the port was put in. When should I seek immediate medical care? When you have an implanted port, you should seek immediate medical care if:  You notice a bad smell coming from the incision site.  You have swelling, redness, or drainage at the incision site.  You have more swelling or pain at the port site or the surrounding area.  You have a fever that is not controlled with medicine.  This information is not intended to replace advice given to you by your health care provider. Make sure you discuss any questions you have with your health care provider. Document  Released: 03/11/2005 Document Revised: 08/17/2015 Document Reviewed: 11/16/2012 Elsevier Interactive Patient Education  2017 [employer]  

## 2017-02-10 DIAGNOSIS — L0889 Other specified local infections of the skin and subcutaneous tissue: Secondary | ICD-10-CM | POA: Diagnosis not present

## 2017-02-10 DIAGNOSIS — L579 Skin changes due to chronic exposure to nonionizing radiation, unspecified: Secondary | ICD-10-CM | POA: Diagnosis not present

## 2017-02-10 DIAGNOSIS — L818 Other specified disorders of pigmentation: Secondary | ICD-10-CM | POA: Diagnosis not present

## 2017-02-10 DIAGNOSIS — D235 Other benign neoplasm of skin of trunk: Secondary | ICD-10-CM | POA: Diagnosis not present

## 2017-02-26 ENCOUNTER — Telehealth: Payer: Self-pay | Admitting: *Deleted

## 2017-02-26 NOTE — Telephone Encounter (Signed)
Patient with history of colon cancer c/o 2.5 week history of change in bowel patterns. She states she can go to the bathroom 6-7 times per day. There isn't much form to her stools, but there also isn't a significant volume. She denies change in diet, pain, or any other symptoms of constipation.  Reviewed with Dr Marin Olp. He would like patient to come in and be seen within the next week.  Patient is aware. Message sent to scheduler.

## 2017-02-27 ENCOUNTER — Other Ambulatory Visit: Payer: Self-pay

## 2017-02-27 ENCOUNTER — Ambulatory Visit (HOSPITAL_BASED_OUTPATIENT_CLINIC_OR_DEPARTMENT_OTHER): Payer: BLUE CROSS/BLUE SHIELD | Admitting: Family

## 2017-02-27 ENCOUNTER — Other Ambulatory Visit (HOSPITAL_BASED_OUTPATIENT_CLINIC_OR_DEPARTMENT_OTHER): Payer: BLUE CROSS/BLUE SHIELD

## 2017-02-27 ENCOUNTER — Ambulatory Visit (HOSPITAL_BASED_OUTPATIENT_CLINIC_OR_DEPARTMENT_OTHER): Payer: BLUE CROSS/BLUE SHIELD

## 2017-02-27 VITALS — BP 121/80 | HR 77 | Temp 97.9°F | Resp 20 | Wt 158.2 lb

## 2017-02-27 DIAGNOSIS — C187 Malignant neoplasm of sigmoid colon: Secondary | ICD-10-CM

## 2017-02-27 DIAGNOSIS — C772 Secondary and unspecified malignant neoplasm of intra-abdominal lymph nodes: Principal | ICD-10-CM

## 2017-02-27 DIAGNOSIS — C189 Malignant neoplasm of colon, unspecified: Secondary | ICD-10-CM | POA: Diagnosis not present

## 2017-02-27 DIAGNOSIS — Z95828 Presence of other vascular implants and grafts: Secondary | ICD-10-CM

## 2017-02-27 DIAGNOSIS — N951 Menopausal and female climacteric states: Secondary | ICD-10-CM

## 2017-02-27 LAB — CMP (CANCER CENTER ONLY)
ALBUMIN: 4.1 g/dL (ref 3.3–5.5)
ALK PHOS: 76 U/L (ref 26–84)
ALT: 24 U/L (ref 10–47)
AST: 22 U/L (ref 11–38)
BUN, Bld: 14 mg/dL (ref 7–22)
CALCIUM: 9.6 mg/dL (ref 8.0–10.3)
CHLORIDE: 103 meq/L (ref 98–108)
CO2: 28 mEq/L (ref 18–33)
Creat: 0.9 mg/dl (ref 0.6–1.2)
Glucose, Bld: 100 mg/dL (ref 73–118)
POTASSIUM: 3.6 meq/L (ref 3.3–4.7)
Sodium: 147 mEq/L — ABNORMAL HIGH (ref 128–145)
TOTAL PROTEIN: 6.9 g/dL (ref 6.4–8.1)
Total Bilirubin: 0.8 mg/dl (ref 0.20–1.60)

## 2017-02-27 LAB — CBC WITH DIFFERENTIAL (CANCER CENTER ONLY)
BASO#: 0 10*3/uL (ref 0.0–0.2)
BASO%: 0.4 % (ref 0.0–2.0)
EOS ABS: 0.3 10*3/uL (ref 0.0–0.5)
EOS%: 3.6 % (ref 0.0–7.0)
HEMATOCRIT: 39.4 % (ref 34.8–46.6)
HEMOGLOBIN: 13.6 g/dL (ref 11.6–15.9)
LYMPH#: 1.7 10*3/uL (ref 0.9–3.3)
LYMPH%: 24.1 % (ref 14.0–48.0)
MCH: 30.7 pg (ref 26.0–34.0)
MCHC: 34.5 g/dL (ref 32.0–36.0)
MCV: 89 fL (ref 81–101)
MONO#: 0.5 10*3/uL (ref 0.1–0.9)
MONO%: 6.4 % (ref 0.0–13.0)
NEUT%: 65.5 % (ref 39.6–80.0)
NEUTROS ABS: 4.7 10*3/uL (ref 1.5–6.5)
Platelets: 253 10*3/uL (ref 145–400)
RBC: 4.43 10*6/uL (ref 3.70–5.32)
RDW: 12 % (ref 11.1–15.7)
WBC: 7.2 10*3/uL (ref 3.9–10.0)

## 2017-02-27 MED ORDER — HEPARIN SOD (PORK) LOCK FLUSH 100 UNIT/ML IV SOLN
500.0000 [IU] | Freq: Once | INTRAVENOUS | Status: AC
Start: 2017-02-27 — End: 2017-02-27
  Administered 2017-02-27: 500 [IU] via INTRAVENOUS
  Filled 2017-02-27: qty 5

## 2017-02-27 MED ORDER — SODIUM CHLORIDE 0.9% FLUSH
10.0000 mL | INTRAVENOUS | Status: DC | PRN
  Administered 2017-02-27: 10 mL via INTRAVENOUS
  Filled 2017-02-27: qty 10

## 2017-02-27 NOTE — Progress Notes (Signed)
Hematology and Oncology Follow Up Visit  Carla Mills [id number] 02-Jan-1971 46 y.o. 02/27/2017   Principle Diagnosis:  Recurrent colon cancer - HER2 (-)/MSI stable/MMR normal/BRAF -  Past Therapy:   FOLFOXIRI - s/p cycle #12 - completed on 11/12/2016 Status post exploratory laparotomy with HIPEC - 07/29/2016  Current Therapy: Observation   Interim History:  Carla Mills is here today with c/o a change in her bowel pattern. She states that her stool looks like sludge and sometimes is feels like she "has a marble in her butt". This started on 02/02/17.  She denies constipation and has not had any abdomina/retal pain or bloating. She has had some scant blood on her toilet tissue several times.  She has a good appetite and started weight watchers in October and has been eating lots of fruits and vegetables. She has lost 12 lbs in the last 2 months. She is hydrating well.  Scans in October were stable. CEA was 2.53 at that time.  She will be seeing Carla Mills on December 18th for follow-up and is scheduled for scans that same day.  No lymphadenopathy found on exam.  No fever, chills, n/v, cough, rash, dizziness, SOB, chest pain, palpitations or changes in bladder habits.  No swelling, tendenress, numbness or tingling in her extremities.   ECOG Performance Status: 1 - Symptomatic but completely ambulatory  Medications:  Allergies as of 02/27/2017      Reactions   Bactrim [sulfamethoxazole-trimethoprim]    Oxaprozin Hives   REACTION: Hives   Penicillins Hives   REACTION: Hives Has patient had a PCN reaction causing immediate rash, facial/tongue/throat swelling, SOB or lightheadedness with hypotension: no Has patient had a PCN reaction causing severe rash involving mucus membranes or skin necrosis: {no Has patient had a PCN reaction that required hospitalization no Has patient had a PCN reaction occurring within the last 10 years: no If all of the above answers are "NO", then may  proceed with Cephalosporin use.   Sulfonamide Derivatives Swelling   Massive extremity swelling       Medication List        Accurate as of 02/27/17  4:05 PM. Always use your most recent med list.          acetaminophen 500 MG tablet Commonly known as:  TYLENOL Take 1,000 mg by mouth every 8 (eight) hours as needed for moderate pain.   fexofenadine 180 MG tablet Commonly known as:  ALLEGRA Take 180 mg by mouth daily as needed for allergies.   fluticasone 50 MCG/ACT nasal spray Commonly known as:  FLONASE Place 1 spray into both nostrils 2 (two) times daily.   ibuprofen 200 MG tablet Commonly known as:  ADVIL,MOTRIN Take 400 mg by mouth every 6 (six) hours as needed for headache or mild pain.   traMADol 50 MG tablet Commonly known as:  ULTRAM Take 1 tablet (50 mg total) by mouth every 6 (six) hours as needed.       Allergies:  Allergies  Allergen Reactions  . Bactrim [Sulfamethoxazole-Trimethoprim]   . Oxaprozin Hives    REACTION: Hives  . Penicillins Hives    REACTION: Hives Has patient had a PCN reaction causing immediate rash, facial/tongue/throat swelling, SOB or lightheadedness with hypotension: no Has patient had a PCN reaction causing severe rash involving mucus membranes or skin necrosis: {no Has patient had a PCN reaction that required hospitalization no Has patient had a PCN reaction occurring within the last 10 years: no If all of the above answers  are "NO", then may proceed with Cephalosporin use.  . Sulfonamide Derivatives Swelling    Massive extremity swelling     Past Medical History, Surgical history, Social history, and Family History were reviewed and updated.  Review of Systems: All other 10 point review of systems is negative.   Physical Exam:  weight is 158 lb 4 oz (71.8 kg). Her oral temperature is 97.9 F (36.6 C). Her blood pressure is 121/80 and her pulse is 77. Her respiration is 20 and oxygen saturation is 98%.   Wt Readings from  Last 3 Encounters:  02/27/17 158 lb 4 oz (71.8 kg)  12/24/16 170 lb (77.1 kg)  11/12/16 169 lb 6.4 oz (76.8 kg)    Ocular: Sclerae unicteric, pupils equal, round and reactive to light Ear-nose-throat: Oropharynx clear, dentition fair Lymphatic: No cervical, supraclavicular or axillary adenopathy Lungs no rales or rhonchi, good excursion bilaterally Heart regular rate and rhythm, no murmur appreciated Abd soft, nontender, positive bowel sounds, no liver or spleen tip palpated on exam, no fluid wave  MSK no focal spinal tenderness, no joint edema Neuro: non-focal, well-oriented, appropriate affect Breasts: Deferred   Lab Results  Component Value Date   WBC 7.2 02/27/2017   HGB 13.6 02/27/2017   HCT 39.4 02/27/2017   MCV 89 02/27/2017   PLT 253 02/27/2017   Lab Results  Component Value Date   FERRITIN 88 06/22/2015   IRON 98 06/22/2015   TIBC 374 06/22/2015   UIBC 275 06/22/2015   IRONPCTSAT 26 06/22/2015   Lab Results  Component Value Date   RETICCTPCT 0.8 02/12/2015   RBC 4.43 02/27/2017   No results found for: KPAFRELGTCHN, LAMBDASER, KAPLAMBRATIO No results found for: Kandis Cocking, IGMSERUM No results found for: Kathrynn Ducking, MSPIKE, SPEI   Chemistry      Component Value Date/Time   NA 147 (H) 02/27/2017 1501   NA 141 07/30/2016 0750   K 3.6 02/27/2017 1501   K 4.2 07/30/2016 0750   CL 103 02/27/2017 1501   CO2 28 02/27/2017 1501   CO2 26 07/30/2016 0750   BUN 14 02/27/2017 1501   BUN 12.1 07/30/2016 0750   CREATININE 0.9 02/27/2017 1501   CREATININE 0.7 07/30/2016 0750      Component Value Date/Time   CALCIUM 9.6 02/27/2017 1501   CALCIUM 9.5 07/30/2016 0750   ALKPHOS 76 02/27/2017 1501   ALKPHOS 72 07/30/2016 0750   AST 22 02/27/2017 1501   AST 22 07/30/2016 0750   ALT 24 02/27/2017 1501   ALT 22 07/30/2016 0750   BILITOT 0.80 02/27/2017 1501   BILITOT 0.54 07/30/2016 0750      Impression and Plan:  Carla Mills is a very pleasant 46 yo caucasian female with recurrent colon cancer - HER2 (-)/MSI stable/MMR normal/BRAF -. She came in today with c/o change in bowel pattern. Her stool has been like sludge and she feels like there is a "marble in her butt" at times. No abdominal pain or bloating.  We will get her set up for colonoscopy with Dr. Fuller Plan.   She is scheduled to see Carla Mills and have repeat scans on December 18th.  We will see what these results show and then follow-up with her accordingly. She is in agreement with the plan.  She will contact our office with any other questions or concerns. We can certainly see her sooner if need be.   Eliezer Bottom, NP 12/6/20184:05 PM

## 2017-02-28 LAB — LACTATE DEHYDROGENASE: LDH: 229 U/L (ref 125–245)

## 2017-02-28 LAB — CEA (IN HOUSE-CHCC): CEA (CHCC-IN HOUSE): 2.24 ng/mL (ref 0.00–5.00)

## 2017-03-03 ENCOUNTER — Encounter (HOSPITAL_BASED_OUTPATIENT_CLINIC_OR_DEPARTMENT_OTHER): Payer: Self-pay | Admitting: Emergency Medicine

## 2017-03-03 ENCOUNTER — Inpatient Hospital Stay (HOSPITAL_BASED_OUTPATIENT_CLINIC_OR_DEPARTMENT_OTHER)
Admission: EM | Admit: 2017-03-03 | Discharge: 2017-03-11 | DRG: 330 | Disposition: A | Discharge status: Home Health | Payer: BLUE CROSS/BLUE SHIELD | Attending: Family Medicine | Admitting: Family Medicine

## 2017-03-03 ENCOUNTER — Other Ambulatory Visit: Payer: Self-pay

## 2017-03-03 ENCOUNTER — Emergency Department (HOSPITAL_BASED_OUTPATIENT_CLINIC_OR_DEPARTMENT_OTHER): Payer: BLUE CROSS/BLUE SHIELD

## 2017-03-03 DIAGNOSIS — K56699 Other intestinal obstruction unspecified as to partial versus complete obstruction: Secondary | ICD-10-CM | POA: Diagnosis not present

## 2017-03-03 DIAGNOSIS — Z881 Allergy status to other antibiotic agents status: Secondary | ICD-10-CM | POA: Diagnosis not present

## 2017-03-03 DIAGNOSIS — E876 Hypokalemia: Secondary | ICD-10-CM | POA: Diagnosis not present

## 2017-03-03 DIAGNOSIS — D72829 Elevated white blood cell count, unspecified: Secondary | ICD-10-CM

## 2017-03-03 DIAGNOSIS — R140 Abdominal distension (gaseous): Secondary | ICD-10-CM | POA: Diagnosis not present

## 2017-03-03 DIAGNOSIS — C189 Malignant neoplasm of colon, unspecified: Secondary | ICD-10-CM | POA: Diagnosis present

## 2017-03-03 DIAGNOSIS — K56600 Partial intestinal obstruction, unspecified as to cause: Secondary | ICD-10-CM | POA: Diagnosis not present

## 2017-03-03 DIAGNOSIS — R109 Unspecified abdominal pain: Secondary | ICD-10-CM | POA: Diagnosis not present

## 2017-03-03 DIAGNOSIS — R110 Nausea: Secondary | ICD-10-CM | POA: Diagnosis not present

## 2017-03-03 DIAGNOSIS — Z9221 Personal history of antineoplastic chemotherapy: Secondary | ICD-10-CM

## 2017-03-03 DIAGNOSIS — Z7951 Long term (current) use of inhaled steroids: Secondary | ICD-10-CM | POA: Diagnosis not present

## 2017-03-03 DIAGNOSIS — C785 Secondary malignant neoplasm of large intestine and rectum: Secondary | ICD-10-CM | POA: Diagnosis not present

## 2017-03-03 DIAGNOSIS — Z87440 Personal history of urinary (tract) infections: Secondary | ICD-10-CM | POA: Diagnosis not present

## 2017-03-03 DIAGNOSIS — Z9049 Acquired absence of other specified parts of digestive tract: Secondary | ICD-10-CM | POA: Diagnosis not present

## 2017-03-03 DIAGNOSIS — G600 Hereditary motor and sensory neuropathy: Secondary | ICD-10-CM | POA: Diagnosis present

## 2017-03-03 DIAGNOSIS — D4959 Neoplasm of unspecified behavior of other genitourinary organ: Secondary | ICD-10-CM | POA: Diagnosis not present

## 2017-03-03 DIAGNOSIS — K660 Peritoneal adhesions (postprocedural) (postinfection): Secondary | ICD-10-CM | POA: Diagnosis not present

## 2017-03-03 DIAGNOSIS — N390 Urinary tract infection, site not specified: Secondary | ICD-10-CM | POA: Diagnosis not present

## 2017-03-03 DIAGNOSIS — Z882 Allergy status to sulfonamides status: Secondary | ICD-10-CM

## 2017-03-03 DIAGNOSIS — K5900 Constipation, unspecified: Secondary | ICD-10-CM

## 2017-03-03 DIAGNOSIS — D509 Iron deficiency anemia, unspecified: Secondary | ICD-10-CM | POA: Diagnosis present

## 2017-03-03 DIAGNOSIS — C763 Malignant neoplasm of pelvis: Secondary | ICD-10-CM | POA: Diagnosis not present

## 2017-03-03 DIAGNOSIS — Z85038 Personal history of other malignant neoplasm of large intestine: Secondary | ICD-10-CM | POA: Diagnosis not present

## 2017-03-03 DIAGNOSIS — E871 Hypo-osmolality and hyponatremia: Secondary | ICD-10-CM | POA: Diagnosis not present

## 2017-03-03 DIAGNOSIS — C187 Malignant neoplasm of sigmoid colon: Secondary | ICD-10-CM

## 2017-03-03 DIAGNOSIS — C772 Secondary and unspecified malignant neoplasm of intra-abdominal lymph nodes: Secondary | ICD-10-CM | POA: Diagnosis not present

## 2017-03-03 DIAGNOSIS — K56609 Unspecified intestinal obstruction, unspecified as to partial versus complete obstruction: Secondary | ICD-10-CM | POA: Diagnosis not present

## 2017-03-03 DIAGNOSIS — K91840 Postprocedural hemorrhage and hematoma of a digestive system organ or structure following a digestive system procedure: Secondary | ICD-10-CM | POA: Diagnosis not present

## 2017-03-03 DIAGNOSIS — D62 Acute posthemorrhagic anemia: Secondary | ICD-10-CM

## 2017-03-03 DIAGNOSIS — Z880 Allergy status to penicillin: Secondary | ICD-10-CM | POA: Diagnosis not present

## 2017-03-03 DIAGNOSIS — R509 Fever, unspecified: Secondary | ICD-10-CM | POA: Diagnosis not present

## 2017-03-03 DIAGNOSIS — J90 Pleural effusion, not elsewhere classified: Secondary | ICD-10-CM | POA: Diagnosis not present

## 2017-03-03 LAB — CBC WITH DIFFERENTIAL/PLATELET
Basophils Absolute: 0 10*3/uL (ref 0.0–0.1)
Basophils Relative: 0 %
Eosinophils Absolute: 0.2 10*3/uL (ref 0.0–0.7)
Eosinophils Relative: 2 %
HCT: 39.1 % (ref 36.0–46.0)
HEMOGLOBIN: 13.5 g/dL (ref 12.0–15.0)
LYMPHS ABS: 1.4 10*3/uL (ref 0.7–4.0)
LYMPHS PCT: 16 %
MCH: 30.4 pg (ref 26.0–34.0)
MCHC: 34.5 g/dL (ref 30.0–36.0)
MCV: 88.1 fL (ref 78.0–100.0)
Monocytes Absolute: 0.6 10*3/uL (ref 0.1–1.0)
Monocytes Relative: 6 %
NEUTROS PCT: 76 %
Neutro Abs: 6.8 10*3/uL (ref 1.7–7.7)
Platelets: 264 10*3/uL (ref 150–400)
RBC: 4.44 MIL/uL (ref 3.87–5.11)
RDW: 11.9 % (ref 11.5–15.5)
WBC: 9.1 10*3/uL (ref 4.0–10.5)

## 2017-03-03 LAB — COMPREHENSIVE METABOLIC PANEL
ALK PHOS: 73 U/L (ref 38–126)
ALT: 18 U/L (ref 14–54)
AST: 21 U/L (ref 15–41)
Albumin: 4.3 g/dL (ref 3.5–5.0)
Anion gap: 7 (ref 5–15)
BUN: 13 mg/dL (ref 6–20)
CALCIUM: 9.4 mg/dL (ref 8.9–10.3)
CO2: 25 mmol/L (ref 22–32)
CREATININE: 0.61 mg/dL (ref 0.44–1.00)
Chloride: 107 mmol/L (ref 101–111)
Glucose, Bld: 92 mg/dL (ref 65–99)
Potassium: 3.5 mmol/L (ref 3.5–5.1)
SODIUM: 139 mmol/L (ref 135–145)
Total Bilirubin: 0.8 mg/dL (ref 0.3–1.2)
Total Protein: 6.9 g/dL (ref 6.5–8.1)

## 2017-03-03 MED ORDER — FLUTICASONE PROPIONATE 50 MCG/ACT NA SUSP
1.0000 | Freq: Two times a day (BID) | NASAL | Status: DC
  Administered 2017-03-04 – 2017-03-07 (×7): 1 via NASAL
  Filled 2017-03-03: qty 16

## 2017-03-03 MED ORDER — IOPAMIDOL (ISOVUE-300) INJECTION 61%
100.0000 mL | Freq: Once | INTRAVENOUS | Status: AC | PRN
  Administered 2017-03-03: 100 mL via INTRAVENOUS

## 2017-03-03 MED ORDER — POLYETHYLENE GLYCOL 3350 17 G PO PACK
17.0000 g | PACK | Freq: Every day | ORAL | Status: DC
  Administered 2017-03-04 – 2017-03-05 (×2): 17 g via ORAL
  Filled 2017-03-03 (×2): qty 1

## 2017-03-03 MED ORDER — ACETAMINOPHEN 650 MG RE SUPP: 650.0000 mg | Freq: Four times a day (QID) | RECTAL | Status: DC | PRN

## 2017-03-03 MED ORDER — SODIUM CHLORIDE 0.9 % IV SOLN
INTRAVENOUS | Status: AC
  Administered 2017-03-03 – 2017-03-04 (×2): via INTRAVENOUS

## 2017-03-03 MED ORDER — ACETAMINOPHEN 325 MG PO TABS: 650.0000 mg | ORAL_TABLET | Freq: Four times a day (QID) | ORAL | Status: DC | PRN

## 2017-03-03 MED ORDER — ONDANSETRON HCL 4 MG/2ML IJ SOLN
4.0000 mg | Freq: Four times a day (QID) | INTRAMUSCULAR | Status: DC | PRN
  Administered 2017-03-03 – 2017-03-06 (×8): 4 mg via INTRAVENOUS
  Filled 2017-03-03 (×10): qty 2

## 2017-03-03 MED ORDER — FLEET ENEMA 7-19 GM/118ML RE ENEM
1.0000 | ENEMA | Freq: Once | RECTAL | Status: AC
  Administered 2017-03-03: 1 via RECTAL
  Filled 2017-03-03: qty 1

## 2017-03-03 MED ORDER — ONDANSETRON HCL 4 MG PO TABS: 4.0000 mg | ORAL_TABLET | Freq: Four times a day (QID) | ORAL | Status: DC | PRN

## 2017-03-03 NOTE — Treatment Plan (Signed)
Admission to [facility] from [facility].  46 yo with hx of colon cancer with abdominal pain and obstipation with CT imaging notable narrowing at previous surgical site within the rectosigmoid with proximal dilatation of the colon and retained fecal matter.  "Direct visualization is recommended".  Also noted transient intussusception within L mid abdomen likely within jejunum.  EDP discussed with Dr. Havery Moros who recommended admission, enema, liquid diet and plan for sigmoidoscopy with dilation.  VSS.  Unremarkable CBC and CMP. Will admit to med surg.

## 2017-03-03 NOTE — ED Triage Notes (Signed)
Patient states that seh was seen by dr. Marin Olp on Thursday and was told that she had a blockage. She was getting a GI consult, but her " colon is so full" that she can not tolerate it anymore

## 2017-03-03 NOTE — ED Notes (Signed)
ED Provider at bedside updating pt on plan of care.

## 2017-03-03 NOTE — H&P (Signed)
History and Physical    Carla Mills NOB:[id number] DOB: 1970-08-16 DOA: 03/03/2017  PCP: Patient, No Pcp Per  Patient coming from: Home.  Chief Complaint: Abdominal pain.  HPI: Carla Mills is a 46 y.o. female with history of colon cancer status post bowel resection and chemotherapy presents to the ER because of worsening abdominal pain.  Patient states she has not had a regular bowel movement for almost a month.  She has some bowel movement each day which is usually very small amount.  Over the last 24 hours patient started developing worsening abdominal pain and came to the ER.  ED Course: CT of the abdomen and pelvis done in the ER shows possible stricture developing at the anastomotic site in the colon.  ER physician discussed with on-call gastroenterologist Dr. Havery Moros who advised patient to be admitted for possible sigmoidoscopy.  Fleet enema was administered in the ER but has not had good results.  By the time I examined the patient patient had one episode of nausea vomiting.  Abdominal sounds was not well appreciated.  Abdomen appears soft.  Review of Systems: As per HPI, rest all negative.   Past Medical History:  Diagnosis Date  . 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 Colonic cancer ([name]) 03/09/2015  . History of chemotherapy    completed on 09/12/2015   . PONV (postoperative nausea and vomiting)   . Rectal mass 02/20/15   Rectal/sigmoid mass    Past Surgical History:  Procedure Laterality Date  . APPENDECTOMY  02/20/15   Abnormal appearing  . CESAREAN SECTION     x 2  . COLONOSCOPY N/A 10/09/2015   Procedure: COLONOSCOPY;  Surgeon: Leighton Ruff, MD;  [facility]: [facility];  Service: Endoscopy;  Laterality: N/A;  . COLOSTOMY  02/20/15   Sigmoid/rectal mass  . COLOSTOMY N/A 02/20/2015   Procedure: COLOSTOMY;  Surgeon: [name], MD;  Location: [place];  Service: General;  Laterality: N/A;  . COLOSTOMY  REVERSAL N/A 11/15/2015   Procedure: COLOSTOMY REVERSAL;  Surgeon: [name], MD;  Location: [facility];  Service: General;  Laterality: N/A;  . EXPLORATORY LAPAROTOMY  02/20/15   Sigmoid/rectal mass  . FLEXIBLE SIGMOIDOSCOPY N/A 02/15/2015   Procedure: FLEXIBLE SIGMOIDOSCOPY;  Surgeon: [name], MD;  Location: [facility] ENDOSCOPY;  Service: Endoscopy;  Laterality: N/A;  . IR GENERIC HISTORICAL  04/08/2016   IR FLUORO GUIDE PORT INSERTION RIGHT 04/08/2016 [name], MD [facility]  . IR GENERIC HISTORICAL  04/08/2016   IR US GUIDE VASC ACCESS RIGHT 04/08/2016 [name], MD [facility]  . LAPAROSCOPIC LYSIS OF ADHESIONS N/A 11/15/2015   Procedure: LAPAROSCOPIC LYSIS OF ADHESIONS;  Surgeon: [name], MD;  Location: [facility];  Service: General;  Laterality: N/A;  . LAPAROSCOPY N/A 02/20/2015   Procedure: LAPAROSCOPY DIAGNOSTIC;  Surgeon: [name], MD;  Location: [place];  Service: General;  Laterality: N/A;  . LAPAROSCOPY ABDOMEN DIAGNOSTIC  02/20/15   Converted to open - Sigmoid/rectal mass  . LAPAROTOMY N/A 02/20/2015   Procedure: EXPLORATORY LAPAROTOMY AND PARTIAL COLECTOMY;  Surgeon: [name], MD;  Location: [place];  Service: General;  Laterality: N/A;  . LOW ANTERIOR BOWEL RESECTION  02/20/15   Sigmoid/rectal mass  . OSTEOTOMY     reconstructed coccyx   . reconstructed nerve in ankle     . TONSILLECTOMY       reports that  has never smoked. she has never used smokeless tobacco. She reports that she drinks  alcohol. She reports that she does not use drugs.  Allergies  Allergen Reactions  . Bactrim [Sulfamethoxazole-Trimethoprim]   . Oxaprozin Hives    REACTION: Hives  . Penicillins Hives    REACTION: Hives Has patient had a PCN reaction causing immediate rash, facial/tongue/throat swelling, SOB or lightheadedness with hypotension: no Has patient had a PCN reaction causing severe rash involving mucus membranes or skin necrosis: {no Has patient had a PCN  reaction that required hospitalization no Has patient had a PCN reaction occurring within the last 10 years: no If all of the above answers are "NO", then may proceed with Cephalosporin use.  . Sulfonamide Derivatives Swelling    Massive extremity swelling     History reviewed. No pertinent family history.  Prior to Admission medications   Medication Sig Start Date End Date Taking? Authorizing Provider  acetaminophen (TYLENOL) 500 MG tablet Take 1,000 mg by mouth every 8 (eight) hours as needed for moderate pain or headache.    Yes [provider]  fluticasone (FLONASE) 50 MCG/ACT nasal spray Place 1 spray into both nostrils 2 (two) times daily.    Yes [provider]  traMADol (ULTRAM) 50 MG tablet Take 1 tablet (50 mg total) by mouth every 6 (six) hours as needed. Patient taking differently: Take 50 mg by mouth every 6 (six) hours as needed for moderate pain.  03/15/16  Yes Volanda Napoleon, MD  fexofenadine (ALLEGRA) 180 MG tablet Take 180 mg by mouth daily as needed for allergies.     [provider]  ibuprofen (ADVIL,MOTRIN) 200 MG tablet Take 400 mg by mouth every 6 (six) hours as needed for headache or mild pain.    [provider]  megestrol (MEGACE) 20 MG tablet  12/24/16   [provider]    Physical Exam: Vitals:   03/03/17 1352 03/03/17 1354 03/03/17 1740 03/03/17 1943  BP:  133/89 118/90 (!) 121/92  Pulse:  82 68 71  Resp:  18 16 16   Temp:  98.1 F (36.7 C)    TempSrc:  Oral    SpO2:  100% 98% 99%  Weight: 71.7 kg (158 lb)     Height: 5\' 5"  (1.651 m)         Constitutional: Moderately built and nourished. Vitals:   03/03/17 1352 03/03/17 1354 03/03/17 1740 03/03/17 1943  BP:  133/89 118/90 (!) 121/92  Pulse:  82 68 71  Resp:  18 16 16   Temp:  98.1 F (36.7 C)    TempSrc:  Oral    SpO2:  100% 98% 99%  Weight: 71.7 kg (158 lb)     Height: 5\' 5"  (1.651 m)      Eyes: Anicteric no pallor. ENMT: No discharge from the  ears eyes nose or mouth. Neck: No mass felt.  No neck rigidity. Respiratory: No rhonchi or crepitations. Cardiovascular: S1-S2 heard. Abdomen: Soft bowel sounds not appreciated.  Nontender no guarding or rigidity. Musculoskeletal: No edema. Skin: No rash. Neurologic: Alert awake oriented to time place and person.  Moves all extremities. Psychiatric: Appears normal.   Labs on Admission: I have personally reviewed following labs and imaging studies  CBC: Recent Labs  Lab 02/27/17 1501 03/03/17 1412  WBC 7.2 9.1  NEUTROABS 4.7 6.8  HGB 13.6 13.5  HCT 39.4 39.1  MCV 89 88.1  PLT 253 678   Basic Metabolic Panel: Recent Labs  Lab 02/27/17 1501 03/03/17 1412  NA 147* 139  K 3.6 3.5  CL 103 107  CO2 28 25  GLUCOSE 100 92  BUN 14 13  CREATININE 0.9 0.61  CALCIUM 9.6 9.4   GFR: Estimated Creatinine Clearance: 87.3 mL/min (by C-G formula based on SCr of 0.61 mg/dL). Liver Function Tests: Recent Labs  Lab 02/27/17 1501 03/03/17 1412  AST 22 21  ALT 24 18  ALKPHOS 76 73  BILITOT 0.80 0.8  PROT 6.9 6.9  ALBUMIN 4.1 4.3   No results for input(s): LIPASE, AMYLASE in the last 168 hours. No results for input(s): AMMONIA in the last 168 hours. Coagulation Profile: No results for input(s): INR, PROTIME in the last 168 hours. Cardiac Enzymes: No results for input(s): CKTOTAL, CKMB, CKMBINDEX, TROPONINI in the last 168 hours. BNP (last 3 results) No results for input(s): PROBNP in the last 8760 hours. HbA1C: No results for input(s): HGBA1C in the last 72 hours. CBG: No results for input(s): GLUCAP in the last 168 hours. Lipid Profile: No results for input(s): CHOL, HDL, LDLCALC, TRIG, CHOLHDL, LDLDIRECT in the last 72 hours. Thyroid Function Tests: No results for input(s): TSH, T4TOTAL, FREET4, T3FREE, THYROIDAB in the last 72 hours. Anemia Panel: No results for input(s): VITAMINB12, FOLATE, FERRITIN, TIBC, IRON, RETICCTPCT in the last 72 hours. Urine analysis:      Component Value Date/Time   COLORURINE YELLOW 05/24/2016 1820   APPEARANCEUR CLEAR 05/24/2016 1820   LABSPEC 1.016 05/24/2016 1820   PHURINE 6.0 05/24/2016 1820   GLUCOSEU NEGATIVE 05/24/2016 1820   HGBUR NEGATIVE 05/24/2016 1820   BILIRUBINUR NEGATIVE 05/24/2016 1820   KETONESUR NEGATIVE 05/24/2016 1820   PROTEINUR NEGATIVE 05/24/2016 1820   NITRITE NEGATIVE 05/24/2016 1820   LEUKOCYTESUR NEGATIVE 05/24/2016 1820   Sepsis Labs: @LABRCNTIP (procalcitonin:4,lacticidven:4) )No results found for this or any previous visit (from the past 240 hour(s)).   Radiological Exams on Admission: Ct Abdomen Pelvis W Contrast  Result Date: 03/03/2017 CLINICAL DATA:  Lower abdominal pain and distension EXAM: CT ABDOMEN AND PELVIS WITH CONTRAST TECHNIQUE: Multidetector CT imaging of the abdomen and pelvis was performed using the standard protocol following bolus administration of intravenous contrast. CONTRAST:  162mL ISOVUE-300 IOPAMIDOL (ISOVUE-300) INJECTION 61% COMPARISON:  12/24/2016 FINDINGS: Lower chest: No acute abnormality. Hepatobiliary: Gallbladder is within normal limits. Scattered cystic lesions are noted throughout the liver. No dominant mass is seen. Pancreas: Unremarkable. No pancreatic ductal dilatation or surrounding inflammatory changes. Spleen: Normal in size without focal abnormality. Adrenals/Urinary Tract: Adrenal glands within normal limits. No renal or ureteral calculi are seen. No obstructive changes are noted. Bladder is partially distended. Stomach/Bowel: Postsurgical changes are noted in the rectosigmoid region consistent with the given clinical history. Proximal to this there is a considerable amount of retained fecal material consistent with constipation and obstruction. There appears to be a degree of narrowing at the previous surgical site. Direct visualization is recommended. No significant small bowel dilatation is noted. There is a transient intussusception within the small  bowel in the left mid abdomen likely within the jejunum. The appendix has been surgically removed. Vascular/Lymphatic: No significant vascular findings are present. No enlarged abdominal or pelvic lymph nodes. Reproductive: Uterus and bilateral adnexa are unremarkable. Other: No abdominal wall hernia or abnormality. No abdominopelvic ascites. Musculoskeletal: No acute or significant osseous findings. IMPRESSION: Apparent narrowing at the previous surgical site within the rectosigmoid with proximal dilatation of the colon and retained fecal material. Direct visualization is recommended. Transient intussusception within the left mid abdomen likely within the jejunum. No obstructive changes are noted. Electronically Signed   By: Linus Mako.D.  On: 03/03/2017 15:41     Assessment/Plan Principal Problem:   Abdominal pain Active Problems:   Anemia, iron deficiency   Colonic cancer ([place])   Charcot Marie Tooth muscular atrophy   History of colon cancer    1. Abdominal pain with nausea vomiting -CT of the abdomen and pelvis shows possible stricture at the anastomotic site.  Dr. Havery Moros on-call gastroenterology has been consulted.  Patient has been kept on clear liquids for now.  IV fluids.  Patient has not had any good results with the [place] enema. 2. History of colon cancer status post bowel resection with chemotherapy -per oncology. 3. History of Charcot Lelan Pons tooth muscular atrophy per the chart.   DVT prophylaxis: SCDs. Code Status: Full code. Family Communication: Discussed with patient. Disposition Plan: Home. Consults called: Gastroenterologist. Admission status: Observation.   Rise Patience MD [facility] Pager [phone].  If 7PM-7AM, please contact night-coverage [email or web address] Password [email or web address]  03/03/2017, 10:58 PM

## 2017-03-03 NOTE — ED Notes (Signed)
Patient transported to CT 

## 2017-03-03 NOTE — ED Provider Notes (Signed)
[place] EMERGENCY DEPARTMENT Provider Note   CSN: [id number] Arrival date & time: 03/03/17  1348     History   Chief Complaint Chief Complaint  Patient presents with  . Abdominal Pain    HPI Carla Mills is a 46 y.o. female.  HPI Carla Mills is a 46 y.o. female with history of static sigmoid colon cancer, recurrent, just finished chemotherapy 4 months ago, currently supposedly cancer free, with history of colostomy and reversal, presents to emergency department complaining of abdominal pain and difficulty having a bowel movement.  Patient states her symptoms started almost a month ago.  She states she has not had a normal bowel movement in that time.  She states that her bowel movements since then have been "smudge on the toilet paper and leakage."  She denies any blood in her stool.  No nausea or she states that her bowel movements have been regular prior to this.  She saw her oncologist 4 days ago, who thought her obstruction was from her anastomosis stricture, and wanted her to see gastroenterologist to get a colonoscopy.  Patient states she has not been able to see gastroenterologist yet.  She states that her pain has become more severe and she is more uncomfortable which is why she came to the ED.  She has not tried any laxatives, she states she does not believe this is constipation since she is eating high-fiber diet and has never had this problem in the past.  Past Medical History:  Diagnosis Date  . 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 Colonic cancer ([place]) 03/09/2015  . History of chemotherapy    completed on 09/12/2015   . PONV (postoperative nausea and vomiting)   . Rectal mass 02/20/15   Rectal/sigmoid mass    Patient Active Problem List   Diagnosis Date Noted  . Intractable nausea and vomiting 05/25/2016  . Anxiety state 05/25/2016  . Acute colitis 05/24/2016  . Cancer of sigmoid colon metastatic to  intra-abdominal lymph node ([place]) 04/03/2016  . 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 Colonic cancer ([place]) 03/09/2015  . Colonic diverticular abscess 02/10/2015  . Constipation 02/10/2015  . Anemia, iron deficiency 02/10/2015  . Liver lesion 02/10/2015  . ACUTE PHARYNGITIS 12/23/2009  . VIRAL URI 12/23/2009    Past Surgical History:  Procedure Laterality Date  . APPENDECTOMY  02/20/15   Abnormal appearing  . CESAREAN SECTION     x 2  . COLONOSCOPY N/A 10/09/2015   Procedure: COLONOSCOPY;  Surgeon: Leighton Ruff, MD;  [facility];  Service: Endoscopy;  Laterality: N/A;  . COLOSTOMY  02/20/15   Sigmoid/rectal mass  . COLOSTOMY N/A 02/20/2015   Procedure: COLOSTOMY;  Surgeon: [name], MD;  Location: [place];  Service: General;  Laterality: N/A;  . COLOSTOMY REVERSAL N/A 11/15/2015   Procedure: COLOSTOMY REVERSAL;  Surgeon: [name], MD;  [facility];  Service: General;  Laterality: N/A;  . EXPLORATORY LAPAROTOMY  02/20/15   Sigmoid/rectal mass  . FLEXIBLE SIGMOIDOSCOPY N/A 02/15/2015   Procedure: FLEXIBLE SIGMOIDOSCOPY;  Surgeon: [name], MD;  Location: [facility] ENDOSCOPY;  Service: Endoscopy;  Laterality: N/A;  . IR GENERIC HISTORICAL  04/08/2016   IR FLUORO GUIDE PORT INSERTION RIGHT 04/08/2016 [name], MD [facility]  . IR GENERIC HISTORICAL  04/08/2016   IR US GUIDE VASC ACCESS RIGHT 04/08/2016 [name], MD [facility]  . LAPAROSCOPIC LYSIS OF ADHESIONS N/A 11/15/2015   Procedure: LAPAROSCOPIC  LYSIS OF ADHESIONS;  Surgeon: [name], MD;  [facility];  Service: General;  Laterality: N/A;  . LAPAROSCOPY N/A 02/20/2015   Procedure: LAPAROSCOPY DIAGNOSTIC;  Surgeon: [name], MD;  Location: [place];  Service: General;  Laterality: N/A;  . LAPAROSCOPY ABDOMEN DIAGNOSTIC  02/20/15   Converted to open - Sigmoid/rectal mass  . LAPAROTOMY N/A 02/20/2015   Procedure: EXPLORATORY LAPAROTOMY AND PARTIAL COLECTOMY;   Surgeon: [name], MD;  Location: [place];  Service: General;  Laterality: N/A;  . LOW ANTERIOR BOWEL RESECTION  02/20/15   Sigmoid/rectal mass  . OSTEOTOMY     reconstructed coccyx   . reconstructed nerve in ankle     . TONSILLECTOMY      OB History    No data available       Home Medications    Prior to Admission medications   Medication Sig Start Date End Date Taking? Authorizing Provider  acetaminophen (TYLENOL) 500 MG tablet Take 1,000 mg by mouth every 8 (eight) hours as needed for moderate pain.     [provider]  fexofenadine (ALLEGRA) 180 MG tablet Take 180 mg by mouth daily as needed for allergies.     [provider]  fluticasone (FLONASE) 50 MCG/ACT nasal spray Place 1 spray into both nostrils 2 (two) times daily.     [provider]  ibuprofen (ADVIL,MOTRIN) 200 MG tablet Take 400 mg by mouth every 6 (six) hours as needed for headache or mild pain.    [provider]  traMADol (ULTRAM) 50 MG tablet Take 1 tablet (50 mg total) by mouth every 6 (six) hours as needed. Patient taking differently: Take 50 mg by mouth every 6 (six) hours as needed for moderate pain.  03/15/16   Volanda Napoleon, MD    Family History History reviewed. No pertinent family history.  Social History Social History   Tobacco Use  . Smoking status: Never Smoker  . Smokeless tobacco: Never Used  Substance Use Topics  . Alcohol use: Yes    Alcohol/week: 0.0 oz    Comment: socially   . Drug use: No     Allergies   Bactrim [sulfamethoxazole-trimethoprim]; Oxaprozin; Penicillins; and Sulfonamide derivatives   Review of Systems Review of Systems  Constitutional: Negative for chills and fever.  Respiratory: Negative for cough, chest tightness and shortness of breath.   Cardiovascular: Negative for chest pain, palpitations and leg swelling.  Gastrointestinal: Positive for abdominal pain. Negative for blood in stool, diarrhea, nausea and vomiting.   Genitourinary: Negative for dysuria, flank pain, pelvic pain, vaginal bleeding, vaginal discharge and vaginal pain.  Musculoskeletal: Negative for arthralgias, myalgias, neck pain and neck stiffness.  Skin: Negative for rash.  Neurological: Negative for dizziness, weakness and headaches.  All other systems reviewed and are negative.    Physical Exam Updated Vital Signs BP 133/89 (BP Location: Left Arm)   Pulse 82   Temp 98.1 F (36.7 C) (Oral)   Resp 18   Ht 5\' 5"  (1.651 m)   Wt 71.7 kg (158 lb)   SpO2 100%   BMI 26.29 kg/m   Physical Exam  Constitutional: She appears well-developed and well-nourished. No distress.  HENT:  Head: Normocephalic.  Eyes: Conjunctivae are normal.  Neck: Neck supple.  Cardiovascular: Normal rate, regular rhythm and normal heart sounds.  Pulmonary/Chest: Effort normal and breath sounds normal. No respiratory distress. She has no wheezes. She has no rales.  Abdominal: Soft. Bowel sounds are normal. She exhibits no distension.  There is no tenderness. There is no rebound.  Musculoskeletal: She exhibits no edema.  Neurological: She is alert.  Skin: Skin is warm and dry.  Psychiatric: She has a normal mood and affect. Her behavior is normal.  Nursing note and vitals reviewed.    ED Treatments / Results  Labs (all labs ordered are listed, but only abnormal results are displayed) Labs Reviewed  CBC WITH DIFFERENTIAL/PLATELET  COMPREHENSIVE METABOLIC PANEL    EKG  EKG Interpretation None       Radiology Ct Abdomen Pelvis W Contrast  Result Date: 03/03/2017 CLINICAL DATA:  Lower abdominal pain and distension EXAM: CT ABDOMEN AND PELVIS WITH CONTRAST TECHNIQUE: Multidetector CT imaging of the abdomen and pelvis was performed using the standard protocol following bolus administration of intravenous contrast. CONTRAST:  179mL ISOVUE-300 IOPAMIDOL (ISOVUE-300) INJECTION 61% COMPARISON:  12/24/2016 FINDINGS: Lower chest: No acute abnormality.  Hepatobiliary: Gallbladder is within normal limits. Scattered cystic lesions are noted throughout the liver. No dominant mass is seen. Pancreas: Unremarkable. No pancreatic ductal dilatation or surrounding inflammatory changes. Spleen: Normal in size without focal abnormality. Adrenals/Urinary Tract: Adrenal glands within normal limits. No renal or ureteral calculi are seen. No obstructive changes are noted. Bladder is partially distended. Stomach/Bowel: Postsurgical changes are noted in the rectosigmoid region consistent with the given clinical history. Proximal to this there is a considerable amount of retained fecal material consistent with constipation and obstruction. There appears to be a degree of narrowing at the previous surgical site. Direct visualization is recommended. No significant small bowel dilatation is noted. There is a transient intussusception within the small bowel in the left mid abdomen likely within the jejunum. The appendix has been surgically removed. Vascular/Lymphatic: No significant vascular findings are present. No enlarged abdominal or pelvic lymph nodes. Reproductive: Uterus and bilateral adnexa are unremarkable. Other: No abdominal wall hernia or abnormality. No abdominopelvic ascites. Musculoskeletal: No acute or significant osseous findings. IMPRESSION: Apparent narrowing at the previous surgical site within the rectosigmoid with proximal dilatation of the colon and retained fecal material. Direct visualization is recommended. Transient intussusception within the left mid abdomen likely within the jejunum. No obstructive changes are noted. Electronically Signed   By: Inez Catalina M.D.   On: 03/03/2017 15:41    Procedures Procedures (including critical care time)  Medications Ordered in ED Medications - No data to display   Initial Impression / Assessment and Plan / ED Course  I have reviewed the triage vital signs and the nursing notes.  Pertinent labs & imaging  results that were available during my care of the patient were reviewed by me and considered in my medical decision making (see chart for details).     Patient with changes in her bowels, in the last month only small bowel movements and more smudges on the toilet paper.  Patient states she is much more comfortable now and having cramps which is why she came to the ED.  On exam, abdomen is soft, nontender.  She states she has not had a normal will get CT scan given her prior history of multiple abdominal surgeries, colon cancer, colostomy with reversal for further evaluation.   4:26 PM CT scan as described above.  I discussed patient with Dr. Enis Gash, who recommended admission, enema, liquid diet, MiraLAX, they will need to perform sigmoidoscopy with dilation in the near future.  We did discuss option of possibly discharging home and doing this as an outpatient, however given patient's discomfort and amount of dilation in the  colon, this would need to be done in the next several days, and given that there are snowstorms outside offices are closed indefinitely, this may not happen this week.  I discussed plan with patient and she agrees.  5:36 PM Spoke with [facility], will admit. GI will consult in AM. Recommended clear diet, miralax, and enema.   Vitals:   03/03/17 1352 03/03/17 1354  BP:  133/89  Pulse:  82  Resp:  18  Temp:  98.1 F (36.7 C)  TempSrc:  Oral  SpO2:  100%  Weight: 71.7 kg (158 lb)   Height: 5\' 5"  (1.651 m)     Final Clinical Impressions(s) / ED Diagnoses   Final diagnoses:  Abdominal pain, unspecified abdominal location  Stricture of colon [facility])    ED Discharge Orders    None       Jeannett Senior, PA-C 03/03/17 [place], Delice Bison, DO 03/04/17 [phone]

## 2017-03-03 NOTE — ED Notes (Signed)
repaged hospitalist via [place] spoke with Marcello Moores

## 2017-03-03 NOTE — ED Notes (Signed)
Expelled mod amt of watery light brown stool with small amt of dark blood noted. Tol well

## 2017-03-04 DIAGNOSIS — K56699 Other intestinal obstruction unspecified as to partial versus complete obstruction: Secondary | ICD-10-CM | POA: Diagnosis not present

## 2017-03-04 DIAGNOSIS — C189 Malignant neoplasm of colon, unspecified: Secondary | ICD-10-CM | POA: Diagnosis not present

## 2017-03-04 DIAGNOSIS — Z85038 Personal history of other malignant neoplasm of large intestine: Secondary | ICD-10-CM

## 2017-03-04 DIAGNOSIS — R109 Unspecified abdominal pain: Secondary | ICD-10-CM | POA: Diagnosis not present

## 2017-03-04 DIAGNOSIS — N390 Urinary tract infection, site not specified: Secondary | ICD-10-CM | POA: Diagnosis not present

## 2017-03-04 DIAGNOSIS — D509 Iron deficiency anemia, unspecified: Secondary | ICD-10-CM | POA: Diagnosis present

## 2017-03-04 DIAGNOSIS — D62 Acute posthemorrhagic anemia: Secondary | ICD-10-CM | POA: Diagnosis not present

## 2017-03-04 DIAGNOSIS — G600 Hereditary motor and sensory neuropathy: Secondary | ICD-10-CM

## 2017-03-04 DIAGNOSIS — Z7951 Long term (current) use of inhaled steroids: Secondary | ICD-10-CM | POA: Diagnosis not present

## 2017-03-04 DIAGNOSIS — Z880 Allergy status to penicillin: Secondary | ICD-10-CM | POA: Diagnosis not present

## 2017-03-04 DIAGNOSIS — Z881 Allergy status to other antibiotic agents status: Secondary | ICD-10-CM | POA: Diagnosis not present

## 2017-03-04 DIAGNOSIS — E871 Hypo-osmolality and hyponatremia: Secondary | ICD-10-CM | POA: Diagnosis not present

## 2017-03-04 DIAGNOSIS — C187 Malignant neoplasm of sigmoid colon: Secondary | ICD-10-CM | POA: Diagnosis not present

## 2017-03-04 DIAGNOSIS — K5900 Constipation, unspecified: Secondary | ICD-10-CM | POA: Diagnosis not present

## 2017-03-04 DIAGNOSIS — Z87440 Personal history of urinary (tract) infections: Secondary | ICD-10-CM | POA: Diagnosis not present

## 2017-03-04 DIAGNOSIS — C785 Secondary malignant neoplasm of large intestine and rectum: Secondary | ICD-10-CM | POA: Diagnosis not present

## 2017-03-04 DIAGNOSIS — D4959 Neoplasm of unspecified behavior of other genitourinary organ: Secondary | ICD-10-CM | POA: Diagnosis not present

## 2017-03-04 DIAGNOSIS — Z9221 Personal history of antineoplastic chemotherapy: Secondary | ICD-10-CM | POA: Diagnosis not present

## 2017-03-04 DIAGNOSIS — R140 Abdominal distension (gaseous): Secondary | ICD-10-CM | POA: Diagnosis not present

## 2017-03-04 DIAGNOSIS — C772 Secondary and unspecified malignant neoplasm of intra-abdominal lymph nodes: Secondary | ICD-10-CM | POA: Diagnosis not present

## 2017-03-04 DIAGNOSIS — K56609 Unspecified intestinal obstruction, unspecified as to partial versus complete obstruction: Secondary | ICD-10-CM | POA: Diagnosis not present

## 2017-03-04 DIAGNOSIS — C763 Malignant neoplasm of pelvis: Secondary | ICD-10-CM | POA: Diagnosis not present

## 2017-03-04 DIAGNOSIS — E876 Hypokalemia: Secondary | ICD-10-CM | POA: Diagnosis not present

## 2017-03-04 DIAGNOSIS — Z9049 Acquired absence of other specified parts of digestive tract: Secondary | ICD-10-CM | POA: Diagnosis not present

## 2017-03-04 DIAGNOSIS — R110 Nausea: Secondary | ICD-10-CM | POA: Diagnosis not present

## 2017-03-04 DIAGNOSIS — K660 Peritoneal adhesions (postprocedural) (postinfection): Secondary | ICD-10-CM | POA: Diagnosis not present

## 2017-03-04 DIAGNOSIS — K91840 Postprocedural hemorrhage and hematoma of a digestive system organ or structure following a digestive system procedure: Secondary | ICD-10-CM | POA: Diagnosis not present

## 2017-03-04 DIAGNOSIS — Z882 Allergy status to sulfonamides status: Secondary | ICD-10-CM | POA: Diagnosis not present

## 2017-03-04 DIAGNOSIS — J90 Pleural effusion, not elsewhere classified: Secondary | ICD-10-CM | POA: Diagnosis not present

## 2017-03-04 DIAGNOSIS — R509 Fever, unspecified: Secondary | ICD-10-CM | POA: Diagnosis not present

## 2017-03-04 DIAGNOSIS — K56600 Partial intestinal obstruction, unspecified as to cause: Secondary | ICD-10-CM | POA: Diagnosis not present

## 2017-03-04 LAB — BASIC METABOLIC PANEL
Anion gap: 9 (ref 5–15)
BUN: 13 mg/dL (ref 6–20)
CHLORIDE: 108 mmol/L (ref 101–111)
CO2: 25 mmol/L (ref 22–32)
Calcium: 9.3 mg/dL (ref 8.9–10.3)
Creatinine, Ser: 0.69 mg/dL (ref 0.44–1.00)
GFR calc Af Amer: >60 mL/min (ref >60)
GFR calc non Af Amer: >60 mL/min (ref >60)
GLUCOSE: 87 mg/dL (ref 65–99)
POTASSIUM: 4 mmol/L (ref 3.5–5.1)
Sodium: 142 mmol/L (ref 135–145)

## 2017-03-04 LAB — CBC
HEMATOCRIT: 39.5 % (ref 36.0–46.0)
Hemoglobin: 13.3 g/dL (ref 12.0–15.0)
MCH: 30 pg (ref 26.0–34.0)
MCHC: 33.7 g/dL (ref 30.0–36.0)
MCV: 89.2 fL (ref 78.0–100.0)
Platelets: 256 10*3/uL (ref 150–400)
RBC: 4.43 MIL/uL (ref 3.87–5.11)
RDW: 12.4 % (ref 11.5–15.5)
WBC: 7.7 10*3/uL (ref 4.0–10.5)

## 2017-03-04 MED ORDER — LACTULOSE 10 GM/15ML PO SOLN
20.0000 g | Freq: Four times a day (QID) | ORAL | Status: DC
  Administered 2017-03-04 – 2017-03-05 (×3): 20 g via ORAL
  Filled 2017-03-04 (×3): qty 30

## 2017-03-04 MED ORDER — SORBITOL 70 % SOLN
960.0000 mL | TOPICAL_OIL | Freq: Four times a day (QID) | ORAL | Status: AC
  Administered 2017-03-04 (×4): 960 mL via RECTAL
  Filled 2017-03-04 (×5): qty 473

## 2017-03-04 MED ORDER — LACTULOSE 10 GM/15ML PO SOLN: 20.0000 g | ORAL | Status: DC

## 2017-03-04 NOTE — Consult Note (Signed)
[facility] Consult Note  Carla Mills 02/16/1971  [id number].    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 Ramirez. Unfortunately developed recurrent stage IV colon cancer in 02/2016. She underwent Hyperthermic intraperitoneal chemotherapy with bilateral salpingoophrectomy, omentectomy, lysis of adhesions 08/08/16 by Dr. Shen at [facility]. She has been followed by Oncology (Dr. Ennever) and received her last cycle of chemo 10/2016 and is now under surveillance rather than active treatment, reporting that she has no current evidence of cancer.   She presented to the ED 03/03/17 with a cc abdominal pain and constipation. She reports constipation since November of this year, describing bowel movements as small-volume, runny, pudding-like stools. She reports that she "never feels empty" after a BM. Patient reports abdominal pain, described as soreness, started yesterday. Associated symptoms include early satiety, nausea, and one episode of non-bloody vomiting. The patient denies trying any stool softeners or laxatives. Denies aggravating or alleviating factors.  Workup significant for CT abdomen pelvis 12/10 showing narrowing at rectosigmoid anastomosis with proximal bowel dilatation and retained stool. CBC and BMET are unremarkable.   ROS: Review of Systems  Constitutional: Negative for chills and fever.  Gastrointestinal: Positive for abdominal pain, blood in stool (BRBPR, scant amount following enema), constipation, nausea and vomiting.  All other systems reviewed and are negative.   History reviewed. No pertinent family history.  Past Medical History:  Diagnosis Date  . Cancer of sigmoid colon metastatic to  intra-abdominal lymph node (HCC) 04/03/2016  . Colonic cancer (HCC) 03/09/2015  . History of chemotherapy    completed on 09/12/2015   . PONV (postoperative nausea and vomiting)   . Rectal mass 02/20/15   Rectal/sigmoid mass    Past Surgical History:  Procedure Laterality Date  . APPENDECTOMY  02/20/15   Abnormal appearing  . CESAREAN SECTION     x 2  . COLONOSCOPY N/A 10/09/2015   Procedure: COLONOSCOPY;  Surgeon: Alicia Thomas, MD;  Location: [facility];  Service: Endoscopy;  Laterality: N/A;  . COLOSTOMY  02/20/15   Sigmoid/rectal mass  . COLOSTOMY N/A 02/20/2015   Procedure: COLOSTOMY;  Surgeon: Armando Ramirez, MD;  Location: [facility];  Service: General;  Laterality: N/A;  . COLOSTOMY REVERSAL N/A 11/15/2015   Procedure: COLOSTOMY REVERSAL;  Surgeon: Armando Ramirez, MD;  Location: [facility];  Service: General;  Laterality: N/A;  . EXPLORATORY LAPAROTOMY  02/20/15   Sigmoid/rectal mass  . FLEXIBLE SIGMOIDOSCOPY N/A 02/15/2015   Procedure: FLEXIBLE SIGMOIDOSCOPY;  Surgeon: Salem F Ganem, MD;  Location: [facility];  Service: Endoscopy;  Laterality: N/A;  . IR GENERIC HISTORICAL  04/08/2016   IR FLUORO GUIDE PORT INSERTION RIGHT 04/08/2016 Daniel Hassell, MD [facility]  . IR GENERIC HISTORICAL  04/08/2016   IR US GUIDE VASC ACCESS RIGHT 04/08/2016 Daniel Hassell, MD [facility]  . LAPAROSCOPIC LYSIS OF ADHESIONS N/A 11/15/2015   Procedure: LAPAROSCOPIC LYSIS OF ADHESIONS;  Surgeon: Armando Ramirez, MD;  Location: [facility];  Service: General;  Laterality: N/A;  . LAPAROSCOPY N/A 02/20/2015   Procedure: LAPAROSCOPY DIAGNOSTIC;  Surgeon: Armando Ramirez, MD;  Location: [facility];  Service: General;  Laterality: N/A;  . LAPAROSCOPY ABDOMEN DIAGNOSTIC  02/20/15   Converted to open - Sigmoid/rectal mass  . LAPAROTOMY N/A 02/20/2015   Procedure: EXPLORATORY LAPAROTOMY AND PARTIAL COLECTOMY;  Surgeon:   [name], MD;  Location: [facility];  Service: General;  Laterality: N/A;  . LOW ANTERIOR  BOWEL RESECTION  02/20/15   Sigmoid/rectal mass  . OSTEOTOMY     reconstructed coccyx   . reconstructed nerve in ankle     . TONSILLECTOMY      Social History:  reports that  has never smoked. she has never used smokeless tobacco. She reports that she drinks alcohol. She reports that she does not use drugs.  Allergies:  Allergies  Allergen Reactions  . Bactrim [Sulfamethoxazole-Trimethoprim]   . Oxaprozin Hives    REACTION: Hives  . Penicillins Hives    REACTION: Hives Has patient had a PCN reaction causing immediate rash, facial/tongue/throat swelling, SOB or lightheadedness with hypotension: no Has patient had a PCN reaction causing severe rash involving mucus membranes or skin necrosis: {no Has patient had a PCN reaction that required hospitalization no Has patient had a PCN reaction occurring within the last 10 years: no If all of the above answers are "NO", then may proceed with Cephalosporin use.  . Sulfonamide Derivatives Swelling    Massive extremity swelling     Medications Prior to Admission  Medication Sig Dispense Refill  . acetaminophen (TYLENOL) 500 MG tablet Take 1,000 mg by mouth every 8 (eight) hours as needed for moderate pain or headache.     . fluticasone (FLONASE) 50 MCG/ACT nasal spray Place 1 spray into both nostrils 2 (two) times daily.     . traMADol (ULTRAM) 50 MG tablet Take 1 tablet (50 mg total) by mouth every 6 (six) hours as needed. (Patient taking differently: Take 50 mg by mouth every 6 (six) hours as needed for moderate pain. ) 90 tablet 0  . fexofenadine (ALLEGRA) 180 MG tablet Take 180 mg by mouth daily as needed for allergies.     Marland Kitchen ibuprofen (ADVIL,MOTRIN) 200 MG tablet Take 400 mg by mouth every 6 (six) hours as needed for headache or mild pain.    . megestrol (MEGACE) 20 MG tablet   4    Blood pressure 112/68, pulse 72, temperature 99.2 F (37.3 C), temperature source Oral, resp. rate 16, height 5' 5" (1.651 m), weight 69.6 kg (153 lb 6.4  oz), SpO2 97 %. Physical Exam: Physical Exam  Constitutional: She appears well-developed and well-nourished.  Non-toxic appearance. She does not appear ill. No distress.  HENT:  Head: Normocephalic and atraumatic.  Eyes: EOM are normal. Pupils are equal, round, and reactive to light. No scleral icterus.  Cardiovascular: Normal rate, regular rhythm, normal heart sounds and intact distal pulses.  No murmur heard. Pulmonary/Chest: Effort normal and breath sounds normal. No stridor. No respiratory distress. She has no wheezes. She exhibits no tenderness.  Abdominal: Soft. Normal appearance and bowel sounds are normal. There is tenderness in the right lower quadrant and left lower quadrant. There is no rigidity, no rebound and no guarding. No hernia.    Results for orders placed or performed during the hospital encounter of 03/03/17 (from the past 48 hour(s))  CBC with Differential     Status: None   Collection Time: 03/03/17  2:12 PM  Result Value Ref Range   WBC 9.1 4.0 - 10.5 K/uL   RBC 4.44 3.87 - 5.11 MIL/uL   Hemoglobin 13.5 12.0 - 15.0 g/dL   HCT 39.1 36.0 - 46.0 %   MCV 88.1 78.0 - 100.0 fL   MCH 30.4 26.0 - 34.0 pg   MCHC 34.5 30.0 - 36.0 g/dL  RDW 11.9 11.5 - 15.5 %   Platelets 264 150 - 400 K/uL   Neutrophils Relative % 76 %   Neutro Abs 6.8 1.7 - 7.7 K/uL   Lymphocytes Relative 16 %   Lymphs Abs 1.4 0.7 - 4.0 K/uL   Monocytes Relative 6 %   Monocytes Absolute 0.6 0.1 - 1.0 K/uL   Eosinophils Relative 2 %   Eosinophils Absolute 0.2 0.0 - 0.7 K/uL   Basophils Relative 0 %   Basophils Absolute 0.0 0.0 - 0.1 K/uL  Comprehensive metabolic panel     Status: None   Collection Time: 03/03/17  2:12 PM  Result Value Ref Range   Sodium 139 135 - 145 mmol/L   Potassium 3.5 3.5 - 5.1 mmol/L   Chloride 107 101 - 111 mmol/L   CO2 25 22 - 32 mmol/L   Glucose, Bld 92 65 - 99 mg/dL   BUN 13 6 - 20 mg/dL   Creatinine, Ser 0.61 0.44 - 1.00 mg/dL   Calcium 9.4 8.9 - 10.3 mg/dL    Total Protein 6.9 6.5 - 8.1 g/dL   Albumin 4.3 3.5 - 5.0 g/dL   AST 21 15 - 41 U/L   ALT 18 14 - 54 U/L   Alkaline Phosphatase 73 38 - 126 U/L   Total Bilirubin 0.8 0.3 - 1.2 mg/dL   GFR calc non Af Amer >60 >60 mL/min   GFR calc Af Amer >60 >60 mL/min    Comment: (NOTE) The eGFR has been calculated using the CKD EPI equation. This calculation has not been validated in all clinical situations. eGFR's persistently <60 mL/min signify possible Chronic Kidney Disease.    Anion gap 7 5 - 15  Basic metabolic panel     Status: None   Collection Time: 03/04/17  5:00 AM  Result Value Ref Range   Sodium 142 135 - 145 mmol/L   Potassium 4.0 3.5 - 5.1 mmol/L   Chloride 108 101 - 111 mmol/L   CO2 25 22 - 32 mmol/L   Glucose, Bld 87 65 - 99 mg/dL   BUN 13 6 - 20 mg/dL   Creatinine, Ser 0.69 0.44 - 1.00 mg/dL   Calcium 9.3 8.9 - 10.3 mg/dL   GFR calc non Af Amer >60 >60 mL/min   GFR calc Af Amer >60 >60 mL/min    Comment: (NOTE) The eGFR has been calculated using the CKD EPI equation. This calculation has not been validated in all clinical situations. eGFR's persistently <60 mL/min signify possible Chronic Kidney Disease.    Anion gap 9 5 - 15  CBC     Status: None   Collection Time: 03/04/17  5:00 AM  Result Value Ref Range   WBC 7.7 4.0 - 10.5 K/uL   RBC 4.43 3.87 - 5.11 MIL/uL   Hemoglobin 13.3 12.0 - 15.0 g/dL   HCT 39.5 36.0 - 46.0 %   MCV 89.2 78.0 - 100.0 fL   MCH 30.0 26.0 - 34.0 pg   MCHC 33.7 30.0 - 36.0 g/dL   RDW 12.4 11.5 - 15.5 %   Platelets 256 150 - 400 K/uL   Ct Abdomen Pelvis W Contrast  Result Date: 03/03/2017 CLINICAL DATA:  Lower abdominal pain and distension EXAM: CT ABDOMEN AND PELVIS WITH CONTRAST TECHNIQUE: Multidetector CT imaging of the abdomen and pelvis was performed using the standard protocol following bolus administration of intravenous contrast. CONTRAST:  100mL ISOVUE-300 IOPAMIDOL (ISOVUE-300) INJECTION 61% COMPARISON:  12/24/2016 FINDINGS: Lower  chest: No   acute abnormality. Hepatobiliary: Gallbladder is within normal limits. Scattered cystic lesions are noted throughout the liver. No dominant mass is seen. Pancreas: Unremarkable. No pancreatic ductal dilatation or surrounding inflammatory changes. Spleen: Normal in size without focal abnormality. Adrenals/Urinary Tract: Adrenal glands within normal limits. No renal or ureteral calculi are seen. No obstructive changes are noted. Bladder is partially distended. Stomach/Bowel: Postsurgical changes are noted in the rectosigmoid region consistent with the given clinical history. Proximal to this there is a considerable amount of retained fecal material consistent with constipation and obstruction. There appears to be a degree of narrowing at the previous surgical site. Direct visualization is recommended. No significant small bowel dilatation is noted. There is a transient intussusception within the small bowel in the left mid abdomen likely within the jejunum. The appendix has been surgically removed. Vascular/Lymphatic: No significant vascular findings are present. No enlarged abdominal or pelvic lymph nodes. Reproductive: Uterus and bilateral adnexa are unremarkable. Other: No abdominal wall hernia or abnormality. No abdominopelvic ascites. Musculoskeletal: No acute or significant osseous findings. IMPRESSION: Apparent narrowing at the previous surgical site within the rectosigmoid with proximal dilatation of the colon and retained fecal material. Direct visualization is recommended. Transient intussusception within the left mid abdomen likely within the jejunum. No obstructive changes are noted. Electronically Signed   By: Mark  Lukens M.D.   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 Followed by Dr. Ennever.  - tolerating CLD - GI consulted: performing bowel  prep and planning for possible flex sig this hospitalization to r/o recurrent malignancy and possibly perform dilation of suspected stricture. Consider PO mag citrate/miralax if enemas fail. - No acute surgical needs. General surgery will follow and make further recommendations should above proposed treatment by GI fail. Will notify Dr. Ramirez of patient admission and abnormal CT.    S , PA-C [facility] 03/04/2017, 12:23 PM Pager: [phone] Consults: [phone] Mon-Fri 7:00 am-4:30 pm Sat-Sun 7:00 am-11:30 am  

## 2017-03-04 NOTE — Progress Notes (Signed)
Only 2 smog enemas  Given given today. Pt has held as long as she could. One small formed stool. Minimal scant amount of blood tinge when she voided or felt like she was going to have a BM . She can only tolerate so much at a time and most of the time the entire smog enema runs out.

## 2017-03-04 NOTE — Consult Note (Signed)
Consultation  Referring Provider:  Dr. Starla Link    Primary Care Physician:  Unknown Primary Gastroenterologist:  Previously Sadie Haber, requesting to change to [facility]     Reason for Consultation:  Partial bowel obstruction, Abnormal CT Abdomen/Pelvis           HPI:   Carla Mills is a 46 y.o. Caucasian female with a past medical history of metastatic sigmoid colon cancer, who recently finished her second round of chemotherapy 4 months ago, presented to the ED on 03/03/17 with a complaint of abdominal pain and constipation.    Today, the patient gives me a brief history of her prior gastroenterological workup.  Apparently, in November 2016 the patient initially presented with constipation for 3 weeks and saw [facility] who tried a lot of laxatives which "never worked" patient had a CT that showed a mass in her sigmoid colon.  She had a sigmoidoscopy and eventually surgery which revealed a 4 inch tumor behind her uterus.  Patient had 10-11 inches of her colon removed and had diverting colostomy.  She then underwent 9 months of oral chemo and had a reversal of her colostomy in August 2017.  Patient then had checkup with her oncologist in November 2017 and had a CT showing for further tumors in her abdomen.  These were biopsied in December and patient was diagnosed with colon cancer stage IV.  She underwent several rounds of IV chemo and HIPEC procedure and then had 5 further rounds of chemo from May to August of this year.  A recent CT on October 2 showed that she was "clear/ cancer free" per her report.    Most recently, the patient tells me that she does keep close watch on her bowel movements and weight and tells me that on November 11 she had no bowel movement.  She went 2 days without having a bowel movement at all and then on the 13th started passing some "small amounts of sludge".  Patient tells me that this continued until Thanksgiving and she has had no change since then, irregardless of a change in  her diet with "more carbs".  She has continued with passing a very small amount of "sludge", patient tells me this is "not much at all".  Patient denies any bloating or abdominal pain until about 2 days ago.  Apparently, she followed with Dr. Marin Olp last week on 02/27/17 and he told her he suspected a stricture at her anastomotic site.  Patient tells me she was trying to "make it", as she has a family cruise planned at the end of December, but yesterday started with worsening abdominal discomfort and felt "completely full", so much so that she could not eat.  Patient did have 2 episodes of nausea and vomiting yesterday after arriving to the ER.  She reports that they tried an enema which she held in for "10 minutes" but still only had a very small amount of stool which did have some bright red blood in it.  She has not tried any laxatives at home.  She has been on a liquid diet since admission.    Patient denies fever, chills, melena, weight loss, heartburn or reflux.  Recent GI History: 10/09/15-Dr. Thomas-colonoscopy: Normal:, Repeat recommended in 1 year for surveillance  Past Medical History:  Diagnosis Date  . Cancer of sigmoid colon metastatic to intra-abdominal lymph node ([place]) 04/03/2016  . Colonic cancer ([place]) 03/09/2015  . History of chemotherapy    completed on 09/12/2015   .  PONV (postoperative nausea and vomiting)   . Rectal mass 02/20/15   Rectal/sigmoid mass    Past Surgical History:  Procedure Laterality Date  . APPENDECTOMY  02/20/15   Abnormal appearing  . CESAREAN SECTION     x 2  . COLONOSCOPY N/A 10/09/2015   Procedure: COLONOSCOPY;  Surgeon: Leighton Ruff, MD;  [facility]: [facility];  Service: Endoscopy;  Laterality: N/A;  . COLOSTOMY  02/20/15   Sigmoid/rectal mass  . COLOSTOMY N/A 02/20/2015   Procedure: COLOSTOMY;  Surgeon: [name], MD;  Location: [place];  Service: General;  Laterality: N/A;  . COLOSTOMY REVERSAL N/A 11/15/2015   Procedure: COLOSTOMY  REVERSAL;  Surgeon: [name], MD;  [facility];  Service: General;  Laterality: N/A;  . EXPLORATORY LAPAROTOMY  02/20/15   Sigmoid/rectal mass  . FLEXIBLE SIGMOIDOSCOPY N/A 02/15/2015   Procedure: FLEXIBLE SIGMOIDOSCOPY;  Surgeon: [name], MD;  Location: [facility] ENDOSCOPY;  Service: Endoscopy;  Laterality: N/A;  . IR GENERIC HISTORICAL  04/08/2016   IR FLUORO GUIDE PORT INSERTION RIGHT 04/08/2016 [name], MD [facility]  . IR GENERIC HISTORICAL  04/08/2016   IR US GUIDE VASC ACCESS RIGHT 04/08/2016 [name], MD [facility]  . LAPAROSCOPIC LYSIS OF ADHESIONS N/A 11/15/2015   Procedure: LAPAROSCOPIC LYSIS OF ADHESIONS;  Surgeon: [name], MD;  [facility];  Service: General;  Laterality: N/A;  . LAPAROSCOPY N/A 02/20/2015   Procedure: LAPAROSCOPY DIAGNOSTIC;  Surgeon: [name], MD;  Location: [place];  Service: General;  Laterality: N/A;  . LAPAROSCOPY ABDOMEN DIAGNOSTIC  02/20/15   Converted to open - Sigmoid/rectal mass  . LAPAROTOMY N/A 02/20/2015   Procedure: EXPLORATORY LAPAROTOMY AND PARTIAL COLECTOMY;  Surgeon: [name], MD;  Location: [place];  Service: General;  Laterality: N/A;  . LOW ANTERIOR BOWEL RESECTION  02/20/15   Sigmoid/rectal mass  . OSTEOTOMY     reconstructed coccyx   . reconstructed nerve in ankle     . TONSILLECTOMY      History reviewed. No pertinent family history.   Social History   Tobacco Use  . Smoking status: Never Smoker  . Smokeless tobacco: Never Used  Substance Use Topics  . Alcohol use: Yes    Alcohol/week: 0.0 oz    Comment: socially   . Drug use: No    Prior to Admission medications   Medication Sig Start Date End Date Taking? Authorizing Provider  acetaminophen (TYLENOL) 500 MG tablet Take 1,000 mg by mouth every 8 (eight) hours as needed for moderate pain or headache.    Yes [provider]  fluticasone (FLONASE) 50 MCG/ACT nasal spray Place 1 spray into both nostrils 2 (two) times  daily.    Yes [provider]  traMADol (ULTRAM) 50 MG tablet Take 1 tablet (50 mg total) by mouth every 6 (six) hours as needed. Patient taking differently: Take 50 mg by mouth every 6 (six) hours as needed for moderate pain.  03/15/16  Yes Volanda Napoleon, MD  fexofenadine (ALLEGRA) 180 MG tablet Take 180 mg by mouth daily as needed for allergies.     [provider]  ibuprofen (ADVIL,MOTRIN) 200 MG tablet Take 400 mg by mouth every 6 (six) hours as needed for headache or mild pain.    [provider]  megestrol (MEGACE) 20 MG tablet  12/24/16   [provider]    Current Facility-Administered Medications  Medication Dose Route Frequency Provider Last Rate Last Dose  . 0.9 %  sodium chloride  infusion   Intravenous Continuous Rise Patience, MD 75 mL/hr at 03/04/17 [phone]    . acetaminophen (TYLENOL) tablet 650 mg  650 mg Oral Q6H PRN Rise Patience, MD       Or  . acetaminophen (TYLENOL) suppository 650 mg  650 mg Rectal Q6H PRN Rise Patience, MD      . fluticasone (FLONASE) 50 MCG/ACT nasal spray 1 spray  1 spray Each Nare BID Rise Patience, MD      . ondansetron [facility]) tablet 4 mg  4 mg Oral Q6H PRN Rise Patience, MD       Or  . ondansetron [facility]) injection 4 mg  4 mg Intravenous Q6H PRN Rise Patience, MD   4 mg at 03/03/17 2326  . polyethylene glycol (MIRALAX / GLYCOLAX) packet 17 g  17 g Oral Daily Rise Patience, MD       Facility-Administered Medications Ordered in Other Encounters  Medication Dose Route Frequency Provider Last Rate Last Dose  . clindamycin (CLEOCIN) 900 mg in dextrose 5 % 50 mL IVPB  900 mg Intravenous 60 min Pre-Op [name], MD       And  . gentamicin (GARAMYCIN) 340 mg in dextrose 5 % 50 mL IVPB  5 mg/kg Intravenous 60 min Pre-Op [name], MD      . dextrose 5 % solution   Intravenous Continuous Volanda Napoleon, MD   Stopped at 04/09/16 1418  . heparin lock flush  100 unit/mL  500 Units Intracatheter Once PRN Volanda Napoleon, MD      . sodium chloride flush (NS) 0.9 % injection 10 mL  10 mL Intracatheter PRN Volanda Napoleon, MD      . sodium chloride flush (NS) 0.9 % injection 10 mL  10 mL Intracatheter PRN Volanda Napoleon, MD   10 mL at 10/31/16 1420    Allergies as of 03/03/2017 - Review Complete 03/03/2017  Allergen Reaction Noted  . Bactrim [sulfamethoxazole-trimethoprim]  04/11/2015  . Oxaprozin Hives 12/23/2009  . Penicillins Hives 12/23/2009  . Sulfonamide derivatives Swelling 12/23/2009     Review of Systems:    Constitutional: No weight loss, fever or chills Skin: No rash Cardiovascular: No chest pain Respiratory: No SOB  Gastrointestinal: See HPI and otherwise negative Genitourinary: No dysuria Neurological: No headache, dizziness or syncope Musculoskeletal: No new muscle or joint pain Hematologic: No bruising Psychiatric: No history of depression or anxiety   Physical Exam:  Vital signs in last 24 hours: Temp:  [98.1 F (36.7 C)-99.2 F (37.3 C)] 99.2 F (37.3 C) (12/11 0445) Pulse Rate:  [68-82] 72 (12/11 0445) Resp:  [16-18] 16 (12/11 0445) BP: (104-133)/(68-92) 112/68 (12/11 0445) SpO2:  [96 %-100 %] 97 % (12/11 0445) Weight:  [153 lb 6.4 oz (69.6 kg)-158 lb (71.7 kg)] 153 lb 6.4 oz (69.6 kg) (12/10 2216) Last BM Date: 03/03/17 General:   Pleasant Caucasian female appears to be in NAD, Well developed, Well nourished, alert and cooperative Head:  Normocephalic and atraumatic. Eyes:   PEERL, EOMI. No icterus. Conjunctiva pink. Ears:  Normal auditory acuity. Neck:  Supple Throat: Oral cavity and pharynx without inflammation, swelling or lesion. Teeth in good condition. Lungs: Respirations even and unlabored. Lungs clear to auscultation bilaterally.   No wheezes, crackles, or rhonchi.  Heart: Normal S1, S2. No MRG. Regular rate and rhythm. No peripheral edema, cyanosis or pallor.  Abdomen:  Soft, nondistended, Mild  b/l Lower abdominal tenderness. No rebound or guarding.  Normal bowel sounds. No appreciable masses or hepatomegaly. + midline surgical scar Rectal:  Not performed.  Msk:  Symmetrical without gross deformities.  Extremities:  Without edema, no deformity or joint abnormality.  Neurologic:  Alert and  oriented x4;  grossly normal neurologically.  Skin:   Dry and intact without significant lesions or rashes. Psychiatric:  Demonstrates good judgement and reason without abnormal affect or behaviors.  LAB RESULTS: Recent Labs    03/03/17 1412 03/04/17 0500  WBC 9.1 7.7  HGB 13.5 13.3  HCT 39.1 39.5  PLT 264 256   BMET Recent Labs    03/03/17 1412 03/04/17 0500  NA 139 142  K 3.5 4.0  CL 107 108  CO2 25 25  GLUCOSE 92 87  BUN 13 13  CREATININE 0.61 0.69  CALCIUM 9.4 9.3   LFT Recent Labs    03/03/17 1412  PROT 6.9  ALBUMIN 4.3  AST 21  ALT 18  ALKPHOS 73  BILITOT 0.8   STUDIES: Ct Abdomen Pelvis W Contrast  Result Date: 03/03/2017 CLINICAL DATA:  Lower abdominal pain and distension EXAM: CT ABDOMEN AND PELVIS WITH CONTRAST TECHNIQUE: Multidetector CT imaging of the abdomen and pelvis was performed using the standard protocol following bolus administration of intravenous contrast. CONTRAST:  169mL ISOVUE-300 IOPAMIDOL (ISOVUE-300) INJECTION 61% COMPARISON:  12/24/2016 FINDINGS: Lower chest: No acute abnormality. Hepatobiliary: Gallbladder is within normal limits. Scattered cystic lesions are noted throughout the liver. No dominant mass is seen. Pancreas: Unremarkable. No pancreatic ductal dilatation or surrounding inflammatory changes. Spleen: Normal in size without focal abnormality. Adrenals/Urinary Tract: Adrenal glands within normal limits. No renal or ureteral calculi are seen. No obstructive changes are noted. Bladder is partially distended. Stomach/Bowel: Postsurgical changes are noted in the rectosigmoid region consistent with the given clinical history. Proximal to this  there is a considerable amount of retained fecal material consistent with constipation and obstruction. There appears to be a degree of narrowing at the previous surgical site. Direct visualization is recommended. No significant small bowel dilatation is noted. There is a transient intussusception within the small bowel in the left mid abdomen likely within the jejunum. The appendix has been surgically removed. Vascular/Lymphatic: No significant vascular findings are present. No enlarged abdominal or pelvic lymph nodes. Reproductive: Uterus and bilateral adnexa are unremarkable. Other: No abdominal wall hernia or abnormality. No abdominopelvic ascites. Musculoskeletal: No acute or significant osseous findings. IMPRESSION: Apparent narrowing at the previous surgical site within the rectosigmoid with proximal dilatation of the colon and retained fecal material. Direct visualization is recommended. Transient intussusception within the left mid abdomen likely within the jejunum. No obstructive changes are noted. Electronically Signed   By: Inez Catalina M.D.   On: 03/03/2017 15:41   PREVIOUS ENDOSCOPIES:            See HPI   Impression / Plan:   Impression: 1.  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  Nausea and vomiting: With above 3.  Abnormal CT the abdomen: See above 4.  History of colon cancer status post bowel resection with chemotherapy  Plan: 1.  We will plan for aggressive enemas today.  Ordered SMOG enemas x2 this morning, will wait a few hours and repeat x2 later today. 2.  Continue clear  liquid diet for now 3.  Continue antiemetics as needed 4.  Discussed with the patient that we will try to clear her out a little bit with  enemas below today and if this goes well, may add some oral laxatives tomorrow, in order to decrease the amount of stool burden before trying a flex sigmoidoscopy with possible dilation of likely stricture.  Patient verbalized understanding. 5.  If patient is unable to tolerate above, may need to proceed with flex sigmoidoscopy sooner 6.  Surgery was consulted, in case their intervention is needed in the future. 7.  Please await any further recommendations from Dr. Havery Moros later today  Thank you for your kind consultation, we will continue to follow.  Lavone Nian Martisha Toulouse  03/04/2017, 9:23 AM Pager #: [phone]

## 2017-03-04 NOTE — Consult Note (Signed)
Referral MD  Reason for Referral: Colonic stricture; metastatic colon cancer-status post HIPEC/chemotherapy.  Chief Complaint  Patient presents with  . Abdominal Pain  : I had a hard time having bowel movements.  HPI: Carla Mills is well-known to me.  She is a very nice 46 year old white female with history of metastatic colon cancer.  She actually underwent HIPEC therapy at [facility] in May.  At the time, they really found very little in the way of residual disease.  She underwent adjuvant chemotherapy afterwards.  She completed this back in August.  She has been doing well.  However, she began having a change in her bowel movements a couple weeks ago.  She was having more difficulty.  She is having some loose stools.  She just felt like she "could never empty myself."  I saw her in the office last week.  I do not think we had to do any scans as she was going to have scans done at [facility] on 18 December.  Yesterday, she developed more in the way of discomfort.  She did have some vomiting.  She came to the emergency room.  I had spoken with gastroenterology.  They were going to see her as an outpatient this week and do a colonoscopy.  She had a CT scan done.  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  No obvious colonic masses were noted.  She has been getting enemas.  She is on laxatives.  She has been seen by gastroenterology.  Hopefully, they will do a colonoscopy in the next day or so.  Her labs look pretty good.  She had no fever.  She had no bleeding.  There is been no dysuria.  Her appetite is down.  Overall, her performance status is ECOG 1.     Past Medical History:  Diagnosis Date  . Cancer of sigmoid colon metastatic to intra-abdominal lymph node ([place]) 04/03/2016  . Colonic cancer ([place]) 03/09/2015  . History of chemotherapy    completed on 09/12/2015   . PONV (postoperative nausea and vomiting)   . Rectal mass 02/20/15   Rectal/sigmoid mass   :  Past Surgical History:  Procedure Laterality Date  . APPENDECTOMY  02/20/15   Abnormal appearing  . CESAREAN SECTION     x 2  . COLONOSCOPY N/A 10/09/2015   Procedure: COLONOSCOPY;  Surgeon: Leighton Ruff, MD;  [facility];  Service: Endoscopy;  Laterality: N/A;  . COLOSTOMY  02/20/15   Sigmoid/rectal mass  . COLOSTOMY N/A 02/20/2015   Procedure: COLOSTOMY;  Surgeon: [name], MD;  Location: [place];  Service: General;  Laterality: N/A;  . COLOSTOMY REVERSAL N/A 11/15/2015   Procedure: COLOSTOMY REVERSAL;  Surgeon: [name], MD;  [facility];  Service: General;  Laterality: N/A;  . EXPLORATORY LAPAROTOMY  02/20/15   Sigmoid/rectal mass  . FLEXIBLE SIGMOIDOSCOPY N/A 02/15/2015   Procedure: FLEXIBLE SIGMOIDOSCOPY;  Surgeon: [name], MD;  Location: [facility] ENDOSCOPY;  Service: Endoscopy;  Laterality: N/A;  . IR GENERIC HISTORICAL  04/08/2016   IR FLUORO GUIDE PORT INSERTION RIGHT 04/08/2016 [name], MD [facility]  . IR GENERIC HISTORICAL  04/08/2016   IR US GUIDE VASC ACCESS RIGHT 04/08/2016 [name], MD [facility]  . LAPAROSCOPIC LYSIS OF ADHESIONS N/A 11/15/2015   Procedure: LAPAROSCOPIC LYSIS OF ADHESIONS;  Surgeon: [name], MD;  [facility];  Service: General;  Laterality: N/A;  . LAPAROSCOPY N/A 02/20/2015   Procedure: LAPAROSCOPY DIAGNOSTIC;  Surgeon: Anne Hahn  Rosendo Gros, MD;  Location: [place];  Service: General;  Laterality: N/A;  . LAPAROSCOPY ABDOMEN DIAGNOSTIC  02/20/15   Converted to open - Sigmoid/rectal mass  . LAPAROTOMY N/A 02/20/2015   Procedure: EXPLORATORY LAPAROTOMY AND PARTIAL COLECTOMY;  Surgeon: [name], MD;  Location: [place];  Service: General;  Laterality: N/A;  . LOW ANTERIOR BOWEL RESECTION  02/20/15   Sigmoid/rectal mass  . OSTEOTOMY     reconstructed coccyx   . reconstructed nerve in ankle     . TONSILLECTOMY    :   Current Facility-Administered Medications:  .  0.9 %  sodium chloride  infusion, , Intravenous, Continuous, Rise Patience, MD, Last Rate: 75 mL/hr at 03/04/17 1242 .  acetaminophen (TYLENOL) tablet 650 mg, 650 mg, Oral, Q6H PRN **OR** acetaminophen (TYLENOL) suppository 650 mg, 650 mg, Rectal, Q6H PRN, Rise Patience, MD .  fluticasone (FLONASE) 50 MCG/ACT nasal spray 1 spray, 1 spray, Each Nare, BID, Rise Patience, MD .  ondansetron (ZOFRAN) tablet 4 mg, 4 mg, Oral, Q6H PRN **OR** ondansetron (ZOFRAN) injection 4 mg, 4 mg, Intravenous, Q6H PRN, Rise Patience, MD, 4 mg at 03/04/17 1242 .  polyethylene glycol (MIRALAX / GLYCOLAX) packet 17 g, 17 g, Oral, Daily, Rise Patience, MD .  sorbitol, milk of mag, mineral oil, glycerin (SMOG) enema, 960 mL, Rectal, QID, Levin Erp, [place], 960 mL at 03/04/17 1446  Facility-Administered Medications Ordered in Other Encounters:  .  clindamycin (CLEOCIN) 900 mg in dextrose 5 % 50 mL IVPB, 900 mg, Intravenous, 60 min Pre-Op **AND** gentamicin (GARAMYCIN) 340 mg in dextrose 5 % 50 mL IVPB, 5 mg/kg, Intravenous, 60 min Pre-Op, [name], MD .  dextrose 5 % solution, , Intravenous, Continuous, [place], Rudell Cobb, MD, Stopped at 04/09/16 1418 .  heparin lock flush 100 unit/mL, 500 Units, Intracatheter, Once PRN, Volanda Napoleon, MD .  sodium chloride flush (NS) 0.9 % injection 10 mL, 10 mL, Intracatheter, PRN, Volanda Napoleon, MD .  sodium chloride flush (NS) 0.9 % injection 10 mL, 10 mL, Intracatheter, PRN, Volanda Napoleon, MD, 10 mL at 10/31/16 1420:  . fluticasone  1 spray Each Nare BID  . polyethylene glycol  17 g Oral Daily  . sorbitol, milk of mag, mineral oil, glycerin (SMOG) enema  960 mL Rectal QID  :  Allergies  Allergen Reactions  . Bactrim [Sulfamethoxazole-Trimethoprim]   . Oxaprozin Hives    REACTION: Hives  . Penicillins Hives    REACTION: Hives Has patient had a PCN reaction causing immediate rash, facial/tongue/throat swelling, SOB or lightheadedness with  hypotension: no Has patient had a PCN reaction causing severe rash involving mucus membranes or skin necrosis: {no Has patient had a PCN reaction that required hospitalization no Has patient had a PCN reaction occurring within the last 10 years: no If all of the above answers are "NO", then may proceed with Cephalosporin use.  . Sulfonamide Derivatives Swelling    Massive extremity swelling   :  History reviewed. No pertinent family history.:  Social History   Socioeconomic History  . Marital status: Married    Spouse name: Not on file  . Number of children: Not on file  . Years of education: Not on file  . Highest education level: Not on file  Social Needs  . Financial resource strain: Not on file  . Food insecurity - worry: Not on file  . Food insecurity - inability: Not on file  . Transportation needs -  medical: Not on file  . Transportation needs - non-medical: Not on file  Occupational History  . Not on file  Tobacco Use  . Smoking status: Never Smoker  . Smokeless tobacco: Never Used  Substance and Sexual Activity  . Alcohol use: Yes    Alcohol/week: 0.0 oz    Comment: socially   . Drug use: No  . Sexual activity: Not on file  Other Topics Concern  . Not on file  Social History Narrative  . Not on file  :  Pertinent items are noted in HPI.  Exam: As stated in the admission history and physical. Patient Vitals for the past 24 hrs:  BP Temp Temp src Pulse Resp SpO2 Height Weight  03/04/17 1341 106/66 98.9 F (37.2 C) Oral 71 16 99 % - -  03/04/17 0445 112/68 99.2 F (37.3 C) Oral 72 16 97 % - -  03/03/17 2216 104/77 98.4 F (36.9 C) Oral 74 16 96 % 5\' 5"  (1.651 m) 153 lb 6.4 oz (69.6 kg)  03/03/17 1943 (!) 121/92 - - 71 16 99 % - -  03/03/17 1740 118/90 - - 68 16 98 % - -     Recent Labs    03/03/17 1412 03/04/17 0500  WBC 9.1 7.7  HGB 13.5 13.3  HCT 39.1 39.5  PLT 264 256   Recent Labs    03/03/17 1412 03/04/17 0500  NA 139 142  K 3.5 4.0   CL 107 108  CO2 25 25  GLUCOSE 92 87  BUN 13 13  CREATININE 0.61 0.69  CALCIUM 9.4 9.3    Blood smear review: None  Pathology: None    Assessment and Plan: Carla Mills is a 46 year old white female.  She has metastatic colon cancer.  So far, she has been in remission by radiographic studies and labs.  Her CEA level has been normal.  Her CEA from last week was 2.24.  Looks like she has a stricture where she had her surgery.  Hopefully, this is a benign stricture.  I would probably give her some lactulose.  We can see if this can help clean her out a little bit.  I think that the real critical test is going to be the colonoscopy.  She may need a stent to open up this stricture.  I do not see anything on her labs that looks suspicious.  I would definitely make sure that her surgeon at [facility]-Dr. Jyl Heinz -knows what is going on.  I know that she will get incredible care by the wonderful staff on [address].  Lattie Haw, MD  Darlyn Chamber 29:13

## 2017-03-04 NOTE — Progress Notes (Signed)
Patient ID: Carla Mills, female   DOB: January 11, 1971, 46 y.o.   MRN: [id number]  PROGRESS NOTE    Carla Mills  XAJ:[id number] DOB: 08-17-1970 DOA: 03/03/2017 PCP: Patient, No Pcp Per   Brief Narrative:  46 year old female with history of colon cancer status post bowel resection and chemotherapy presented with worsening abdominal pain.  CT scan of the abdomen pelvis showed possible stricture developing at the anastomotic site in the colon.  GI was consulted.  Assessment & Plan:   Principal Problem:   Abdominal pain Active Problems:   Anemia, iron deficiency   Colonic cancer ([place])   Charcot Marie Tooth muscular atrophy   History of colon cancer     1. Abdominal pain with nausea vomiting -CT of the abdomen and pelvis shows possible stricture at the anastomotic site.    GI consulted.  Will follow recommendations.  Continue clear liquids and IV fluids.  Patient has not had any good results with the Fleet enema.  Repeat a.m. labs 2. History of colon cancer status post bowel resection with chemotherapy -outpatient follow-up with oncology 3. History of Charcot Lelan Pons tooth muscular atrophy per the chart.   DVT prophylaxis: SCDs Code Status: Full Family Communication: None at bedside Disposition Plan: Probable home in 1-2 days  Consultants: GI  Procedures: None  Antimicrobials: None   Subjective: Patient seen and examined at bedside.  She still having mild lower abdominal pain.  No current nausea or vomiting.  Objective: Vitals:   03/03/17 1740 03/03/17 1943 03/03/17 2216 03/04/17 0445  BP: 118/90 (!) 121/92 104/77 112/68  Pulse: 68 71 74 72  Resp: 16 16 16 16   Temp:   98.4 F (36.9 C) 99.2 F (37.3 C)  TempSrc:   Oral Oral  SpO2: 98% 99% 96% 97%  Weight:   69.6 kg (153 lb 6.4 oz)   Height:   5\' 5"  (1.651 m)     Intake/Output Summary (Last 24 hours) at 03/04/2017 1015 Last data filed at 03/04/2017 0644 Gross per 24 hour  Intake 366.25 ml  Output -  Net 366.25  ml   Filed Weights   03/03/17 1352 03/03/17 2216  Weight: 71.7 kg (158 lb) 69.6 kg (153 lb 6.4 oz)    Examination:  General exam: Appears calm and comfortable  Respiratory system: Bilateral decreased breath sound at bases Cardiovascular system: S1 & S2 heard, rate controlled  gastrointestinal system: Abdomen is nondistended, soft and mildly tender in the lower quadrant.  Bowel sounds sluggish  Extremities: No cyanosis, clubbing, edema    Data Reviewed: I have personally reviewed following labs and imaging studies  CBC: Recent Labs  Lab 02/27/17 1501 03/03/17 1412 03/04/17 0500  WBC 7.2 9.1 7.7  NEUTROABS 4.7 6.8  --   HGB 13.6 13.5 13.3  HCT 39.4 39.1 39.5  MCV 89 88.1 89.2  PLT 253 264 094   Basic Metabolic Panel: Recent Labs  Lab 02/27/17 1501 03/03/17 1412 03/04/17 0500  NA 147* 139 142  K 3.6 3.5 4.0  CL 103 107 108  CO2 28 25 25   GLUCOSE 100 92 87  BUN 14 13 13   CREATININE 0.9 0.61 0.69  CALCIUM 9.6 9.4 9.3   GFR: Estimated Creatinine Clearance: 86 mL/min (by C-G formula based on SCr of 0.69 mg/dL). Liver Function Tests: Recent Labs  Lab 02/27/17 1501 03/03/17 1412  AST 22 21  ALT 24 18  ALKPHOS 76 73  BILITOT 0.80 0.8  PROT 6.9 6.9  ALBUMIN 4.1 4.3   No  results for input(s): LIPASE, AMYLASE in the last 168 hours. No results for input(s): AMMONIA in the last 168 hours. Coagulation Profile: No results for input(s): INR, PROTIME in the last 168 hours. Cardiac Enzymes: No results for input(s): CKTOTAL, CKMB, CKMBINDEX, TROPONINI in the last 168 hours. BNP (last 3 results) No results for input(s): PROBNP in the last 8760 hours. HbA1C: No results for input(s): HGBA1C in the last 72 hours. CBG: No results for input(s): GLUCAP in the last 168 hours. Lipid Profile: No results for input(s): CHOL, HDL, LDLCALC, TRIG, CHOLHDL, LDLDIRECT in the last 72 hours. Thyroid Function Tests: No results for input(s): TSH, T4TOTAL, FREET4, T3FREE, THYROIDAB in  the last 72 hours. Anemia Panel: No results for input(s): VITAMINB12, FOLATE, FERRITIN, TIBC, IRON, RETICCTPCT in the last 72 hours. Sepsis Labs: No results for input(s): PROCALCITON, LATICACIDVEN in the last 168 hours.  No results found for this or any previous visit (from the past 240 hour(s)).       Radiology Studies: Ct Abdomen Pelvis W Contrast  Result Date: 03/03/2017 CLINICAL DATA:  Lower abdominal pain and distension EXAM: CT ABDOMEN AND PELVIS WITH CONTRAST TECHNIQUE: Multidetector CT imaging of the abdomen and pelvis was performed using the standard protocol following bolus administration of intravenous contrast. CONTRAST:  117mL ISOVUE-300 IOPAMIDOL (ISOVUE-300) INJECTION 61% COMPARISON:  12/24/2016 FINDINGS: Lower chest: No acute abnormality. Hepatobiliary: Gallbladder is within normal limits. Scattered cystic lesions are noted throughout the liver. No dominant mass is seen. Pancreas: Unremarkable. No pancreatic ductal dilatation or surrounding inflammatory changes. Spleen: Normal in size without focal abnormality. Adrenals/Urinary Tract: Adrenal glands within normal limits. No renal or ureteral calculi are seen. No obstructive changes are noted. Bladder is partially distended. Stomach/Bowel: Postsurgical changes are noted in the rectosigmoid region consistent with the given clinical history. Proximal to this there is a considerable amount of retained fecal material consistent with constipation and obstruction. There appears to be a degree of narrowing at the previous surgical site. Direct visualization is recommended. No significant small bowel dilatation is noted. There is a transient intussusception within the small bowel in the left mid abdomen likely within the jejunum. The appendix has been surgically removed. Vascular/Lymphatic: No significant vascular findings are present. No enlarged abdominal or pelvic lymph nodes. Reproductive: Uterus and bilateral adnexa are unremarkable.  Other: No abdominal wall hernia or abnormality. No abdominopelvic ascites. Musculoskeletal: No acute or significant osseous findings. IMPRESSION: Apparent narrowing at the previous surgical site within the rectosigmoid with proximal dilatation of the colon and retained fecal material. Direct visualization is recommended. Transient intussusception within the left mid abdomen likely within the jejunum. No obstructive changes are noted. Electronically Signed   By: Inez Catalina M.D.   On: 03/03/2017 15:41        Scheduled Meds: . fluticasone  1 spray Each Nare BID  . polyethylene glycol  17 g Oral Daily  . sorbitol, milk of mag, mineral oil, glycerin (SMOG) enema  960 mL Rectal QID   Continuous Infusions: . sodium chloride 75 mL/hr at 03/04/17 0641     LOS: 0 days        Aline August, MD [facility] Pager [phone]  If 7PM-7AM, please contact night-coverage [email or web address] Password [email or web address] 03/04/2017, 10:15 AM

## 2017-03-05 ENCOUNTER — Inpatient Hospital Stay (HOSPITAL_COMMUNITY): Payer: BLUE CROSS/BLUE SHIELD

## 2017-03-05 DIAGNOSIS — C187 Malignant neoplasm of sigmoid colon: Principal | ICD-10-CM

## 2017-03-05 LAB — BASIC METABOLIC PANEL
Anion gap: 9 (ref 5–15)
BUN: 10 mg/dL (ref 6–20)
CALCIUM: 8.9 mg/dL (ref 8.9–10.3)
CHLORIDE: 107 mmol/L (ref 101–111)
CO2: 24 mmol/L (ref 22–32)
CREATININE: 0.63 mg/dL (ref 0.44–1.00)
GFR calc non Af Amer: >60 mL/min (ref >60)
GLUCOSE: 84 mg/dL (ref 65–99)
Potassium: 3.7 mmol/L (ref 3.5–5.1)
Sodium: 140 mmol/L (ref 135–145)

## 2017-03-05 LAB — MAGNESIUM: Magnesium: 1.8 mg/dL (ref 1.7–2.4)

## 2017-03-05 MED ORDER — ALUM & MAG HYDROXIDE-SIMETH 200-200-20 MG/5ML PO SUSP
30.0000 mL | ORAL | Status: DC | PRN
  Administered 2017-03-05: 30 mL via ORAL
  Filled 2017-03-05: qty 30

## 2017-03-05 MED ORDER — SORBITOL 70 % SOLN
960.0000 mL | TOPICAL_OIL | Freq: Three times a day (TID) | ORAL | Status: DC
  Administered 2017-03-05 – 2017-03-06 (×3): 960 mL via RECTAL
  Filled 2017-03-05 (×4): qty 473

## 2017-03-05 MED ORDER — DEXTROSE-NACL 5-0.45 % IV SOLN
INTRAVENOUS | Status: DC
  Administered 2017-03-05 – 2017-03-07 (×5): via INTRAVENOUS

## 2017-03-05 MED ORDER — BOOST / RESOURCE BREEZE PO LIQD CUSTOM
1.0000 | Freq: Three times a day (TID) | ORAL | Status: DC
  Administered 2017-03-05 – 2017-03-06 (×2): 1 via ORAL

## 2017-03-05 MED ORDER — POLYETHYLENE GLYCOL 3350 17 G PO PACK
17.0000 g | PACK | Freq: Four times a day (QID) | ORAL | Status: DC
  Administered 2017-03-05 – 2017-03-06 (×4): 17 g via ORAL
  Filled 2017-03-05 (×7): qty 1

## 2017-03-05 NOTE — Progress Notes (Signed)
Carla Mills is about the same.  She had a little bit of a bowel movement last night.  She is still doing the enemas.  I am not sure how much she will be able to go if she has this stricture.  She is on lactulose.  She has had some dyspepsia.  She has had no obvious bleeding.  She has had no fever.  She has had no cough or shortness of breath.  Her labs look pretty good.  There is no CBC back.  Her metabolic panel looks okay.  Her potassium is 3.7.  She has had no leg swelling.  She has had no rashes.  On her physical exam, her vital signs all look stable.  Her temperature is 98.6.  Her pulse is 81.  Her blood pressure is 120/73.  Her abdomen is soft.  She has active bowel sounds.  She has no obvious guarding or rebound tenderness.  There may be some tenderness to palpation in the hypoumbilical region.  She has no fluid wave.  There is no palpable liver or spleen tip.  Her lungs are clear.  Cardiac exam regular rate and rhythm.  Extremities shows no clubbing, cyanosis or edema.  Hopefully, she will have the colonoscopy today.  I really think that she needs a stent.  Hopefully this will be able to be placed.  I appreciate everybody's help on 3 W.  Carla Haw, MD  Carla Mills 11:1-6

## 2017-03-05 NOTE — Progress Notes (Signed)
1 more small liquid BM, no solid piece seem.

## 2017-03-05 NOTE — Progress Notes (Signed)
2 soft mushy BM mixed with enema  a hour after smog enemas,  Patient instructed to call when she goes again.

## 2017-03-05 NOTE — Progress Notes (Signed)
    CC:  Nausea and vomiting, abdominal pain  Subjective: Nurse notes it took 2 hours to get each enema in and minimal results, so I would expect film will show ongoing large stool burden.  Some bleeding with the enemas yesterday.   Despite this she seems to be dealing pretty well with this.    Objective: Vital signs in last 24 hours: Temp:  [98.3 F (36.8 C)-98.9 F (37.2 C)] 98.6 F (37 C) (12/12 0448) Pulse Rate:  [71-81] 81 (12/12 0448) Resp:  [16-18] 18 (12/12 0448) BP: (106-130)/(66-77) 120/73 (12/12 0448) SpO2:  [96 %-99 %] 96 % (12/12 0448) Last BM Date: 03/05/17 Nothing PO/IV recorded Stool x 1 recorded I/O 2-3 recorded in nursing notes Afebrile, VSS BMP OK this AM   Intake/Output from previous day: 12/11 0701 - 12/12 0700 In: -  Out: 1 [Stool:1] Intake/Output this shift: No intake/output data recorded.  General appearance: alert, cooperative and no distress GI: soft, up in chair so exam not very useful.  Lab Results:  Recent Labs    03/03/17 1412 03/04/17 0500  WBC 9.1 7.7  HGB 13.5 13.3  HCT 39.1 39.5  PLT 264 256    BMET Recent Labs    03/04/17 0500 03/05/17 0521  NA 142 140  K 4.0 3.7  CL 108 107  CO2 25 24  GLUCOSE 87 84  BUN 13 10  CREATININE 0.69 0.63  CALCIUM 9.3 8.9   PT/INR No results for input(s): LABPROT, INR in the last 72 hours.  Recent Labs  Lab 02/27/17 1501 03/03/17 1412  AST 22 21  ALT 24 18  ALKPHOS 76 73  BILITOT 0.80 0.8  PROT 6.9 6.9  ALBUMIN 4.1 4.3     Lipase     Component Value Date/Time   LIPASE 41 05/24/2016 1648     Medications: . fluticasone  1 spray Each Nare BID  . lactulose  20 g Oral Q6H  . 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 Rosendo Gros - last dose 10/2016. Followed by Dr. Marin Olp.  - tolerating CLD  - GI consulted: performing bowel prep and planning for possible flex sig this hospitalization to r/o recurrent malignancy and possibly perform dilation of suspected stricture. Consider PO mag citrate/miralax if enemas fail. - No acute surgical needs. General surgery will follow and make further recommendations should above proposed treatment by GI fail. Will notify Dr. Rosendo Gros of patient admission and abnormal CT.  FEN: Clear liquids ID:  None DVT:  SCD Foley:  None Follow up: TBD    Plan:  Film pending, GI to do Flex sig ASAP.  We will follow with you.   LOS: 1 day    Harlee Eckroth 03/05/2017 [phone]

## 2017-03-05 NOTE — Progress Notes (Signed)
2 more warm smog enemas given, patient tolerated well but no BM except smears when urge to push.  Like the first two enemas, most of the solutions just runs out. lactulose given as ordered.  Patient appreciated the care and is glad to get some rest for the rest of the night. Will continue to monitor

## 2017-03-05 NOTE — Plan of Care (Signed)
  Safety: Ability to remain free from injury will improve 03/05/2017 0153 - Completed/Met by Mickie Kay, RN

## 2017-03-05 NOTE — Progress Notes (Signed)
vomited approximately 300 ml green bile. Zofran given 4 mg IV. One small formed soft stool with loose stool this am

## 2017-03-05 NOTE — Progress Notes (Signed)
[facility] PROGRESS NOTE  Carla Mills OZY:[id number] DOB: Aug 23, 1970 DOA: 03/03/2017  PCP: Patient, No Pcp Per  Brief History/Interval Summary: 46 year old Caucasian female with a past medical history of colon cancer status post bowel resection and chemotherapy presented with complains of worsening abdominal pain.  Evaluation revealed a possible stricture at the anastomotic site of the colon.  Patient was hospitalized for further management.  Reason for Visit: Colonic stricture  Consultants: Gastroenterology.  General surgery.  Oncology  Procedures: None yet  Antibiotics: None  Subjective/Interval History: Patient has been having nausea and vomiting.  She did tolerate the enemas but has had only few bowel movements. Continues to have some discomfort in the abdomen.  ROS: Denies any chest pain or shortness of breath.  Objective:  Vital Signs  Vitals:   03/04/17 0445 03/04/17 1341 03/04/17 2014 03/05/17 0448  BP: 112/68 106/66 130/77 120/73  Pulse: 72 71 80 81  Resp: 16 16 18 18   Temp: 99.2 F (37.3 C) 98.9 F (37.2 C) 98.3 F (36.8 C) 98.6 F (37 C)  TempSrc: Oral Oral Oral Oral  SpO2: 97% 99% 98% 96%  Weight:      Height:        Intake/Output Summary (Last 24 hours) at 03/05/2017 1404 Last data filed at 03/05/2017 0300 Gross per 24 hour  Intake -  Output 1 ml  Net -1 ml   Filed Weights   03/03/17 1352 03/03/17 2216  Weight: 71.7 kg (158 lb) 69.6 kg (153 lb 6.4 oz)    General appearance: alert, cooperative, appears stated age and no distress Head: Normocephalic, without obvious abnormality, atraumatic Resp: clear to auscultation bilaterally Cardio: regular rate and rhythm, S1, S2 normal, no murmur, click, rub or gallop GI: soft, non-tender; bowel sounds normal; no masses,  no organomegaly Extremities: extremities normal, atraumatic, no cyanosis or edema Neurologic: No focal deficits  Lab Results:  Data Reviewed: I have personally reviewed  following labs and imaging studies  CBC: Recent Labs  Lab 02/27/17 1501 03/03/17 1412 03/04/17 0500  WBC 7.2 9.1 7.7  NEUTROABS 4.7 6.8  --   HGB 13.6 13.5 13.3  HCT 39.4 39.1 39.5  MCV 89 88.1 89.2  PLT 253 264 048    Basic Metabolic Panel: Recent Labs  Lab 02/27/17 1501 03/03/17 1412 03/04/17 0500 03/05/17 0521  NA 147* 139 142 140  K 3.6 3.5 4.0 3.7  CL 103 107 108 107  CO2 28 25 25 24   GLUCOSE 100 92 87 84  BUN 14 13 13 10   CREATININE 0.9 0.61 0.69 0.63  CALCIUM 9.6 9.4 9.3 8.9  MG  --   --   --  1.8    GFR: Estimated Creatinine Clearance: 86 mL/min (by C-G formula based on SCr of 0.63 mg/dL).  Liver Function Tests: Recent Labs  Lab 02/27/17 1501 03/03/17 1412  AST 22 21  ALT 24 18  ALKPHOS 76 73  BILITOT 0.80 0.8  PROT 6.9 6.9  ALBUMIN 4.1 4.3     Radiology Studies: Ct Abdomen Pelvis W Contrast  Result Date: 03/03/2017 CLINICAL DATA:  Lower abdominal pain and distension EXAM: CT ABDOMEN AND PELVIS WITH CONTRAST TECHNIQUE: Multidetector CT imaging of the abdomen and pelvis was performed using the standard protocol following bolus administration of intravenous contrast. CONTRAST:  14mL ISOVUE-300 IOPAMIDOL (ISOVUE-300) INJECTION 61% COMPARISON:  12/24/2016 FINDINGS: Lower chest: No acute abnormality. Hepatobiliary: Gallbladder is within normal limits. Scattered cystic lesions are noted throughout the liver. No dominant mass is seen.  Pancreas: Unremarkable. No pancreatic ductal dilatation or surrounding inflammatory changes. Spleen: Normal in size without focal abnormality. Adrenals/Urinary Tract: Adrenal glands within normal limits. No renal or ureteral calculi are seen. No obstructive changes are noted. Bladder is partially distended. Stomach/Bowel: Postsurgical changes are noted in the rectosigmoid region consistent with the given clinical history. Proximal to this there is a considerable amount of retained fecal material consistent with constipation and  obstruction. There appears to be a degree of narrowing at the previous surgical site. Direct visualization is recommended. No significant small bowel dilatation is noted. There is a transient intussusception within the small bowel in the left mid abdomen likely within the jejunum. The appendix has been surgically removed. Vascular/Lymphatic: No significant vascular findings are present. No enlarged abdominal or pelvic lymph nodes. Reproductive: Uterus and bilateral adnexa are unremarkable. Other: No abdominal wall hernia or abnormality. No abdominopelvic ascites. Musculoskeletal: No acute or significant osseous findings. IMPRESSION: Apparent narrowing at the previous surgical site within the rectosigmoid with proximal dilatation of the colon and retained fecal material. Direct visualization is recommended. Transient intussusception within the left mid abdomen likely within the jejunum. No obstructive changes are noted. Electronically Signed   By: Inez Catalina M.D.   On: 03/03/2017 15:41   Dg Abd Acute W/chest  Result Date: 03/05/2017 CLINICAL DATA:  Abdominal pain EXAM: DG ABDOMEN ACUTE W/ 1V CHEST COMPARISON:  03/03/2017 FINDINGS: Cardiac shadow is within normal limits. Right-sided chest wall port is noted in satisfactory position. The lungs are well-aerated without focal infiltrate or sizable effusion. Scattered large and small bowel gas is noted. Air-fluid levels are noted within the colon new from the prior exam with an overall decrease in the amount of fecal material within the colon. There remains considerable fecal material within the right colon. No free air is seen. Surgical changes in the pelvis are again noted. No bony abnormality is seen. IMPRESSION: Overall decrease in the amount of fecal material within the colon. This is particularly noted in the transverse colon and descending colon. No acute intrathoracic abnormality is noted. Electronically Signed   By: Inez Catalina M.D.   On: 03/05/2017 10:33      Medications:  Scheduled: . feeding supplement  1 Container Oral TID BM  . fluticasone  1 spray Each Nare BID  . polyethylene glycol  17 g Oral QID  . sorbitol, milk of mag, mineral oil, glycerin (SMOG) enema  960 mL Rectal TID   Continuous: . 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  Patient continues to have nausea vomiting and abdominal discomfort.  Gastroenterology is following.  Plan for noticed to attempt a flexible sigmoidoscopy after the patient has been appropriately prepped.  Continue IV fluids.   History of colon cancer status post bowel resection status post chemotherapy Outpatient follow-up with oncology.   DVT Prophylaxis: SCDs Code Status: Full Code Family Communication: Discussed with the patient Disposition Plan: Management as outlined above    LOS: 1 day   [facility] Pager [phone] 03/05/2017, 2:04 PM  If 7PM-7AM, please contact night-coverage at [email or web address], password [email or web address]

## 2017-03-05 NOTE — Progress Notes (Signed)
Date: March 05, 2017 Velva Harman, BSN, [place], [place] Chart and notes review for patient progress and needs. Will follow for case management and discharge needs. Next review date: [id number]

## 2017-03-05 NOTE — Progress Notes (Signed)
Patient ID: Carla Mills, female   DOB: [birth year], 46 y.o.   MRN: [id number]     Progress Note   Subjective    Has been vomiting off and on Abdomen feels about the same , lots of rumbling Has had some results with SMOG enemas- though no large amt of stool , has  passed some mushy stool.   Objective   Vital signs in last 24 hours: Temp:  [98.3 F (36.8 C)-98.9 F (37.2 C)] 98.6 F (37 C) (12/12 0448) Pulse Rate:  [71-81] 81 (12/12 0448) Resp:  [16-18] 18 (12/12 0448) BP: (106-130)/(66-77) 120/73 (12/12 0448) SpO2:  [96 %-99 %] 96 % (12/12 0448) Last BM Date: 03/05/17 General:   Young WF in NAD Heart:  Regular rate and rhythm; no murmurs Lungs: Respirations even and unlabored, lungs CTA bilaterally Abdomen:  Soft,full felling but not obviously distended , BS active , mild diffuse tenderness, midline incisional scar Extremities:   No edema Neurologic:  Alert and oriented,  grossly normal neurologically. Psych:  Cooperative. Normal mood and affect.  Intake/Output from previous day: 12/11 0701 - 12/12 0700 In: -  Out: 1 [Stool:1] Intake/Output this shift: No intake/output data recorded.  Lab Results: Recent Labs    03/03/17 1412 03/04/17 0500  WBC 9.1 7.7  HGB 13.5 13.3  HCT 39.1 39.5  PLT 264 256   BMET Recent Labs    03/03/17 1412 03/04/17 0500 03/05/17 0521  NA 139 142 140  K 3.5 4.0 3.7  CL 107 108 107  CO2 25 25 24   GLUCOSE 92 87 84  BUN 13 13 10   CREATININE 0.61 0.69 0.63  CALCIUM 9.4 9.3 8.9   LFT Recent Labs    03/03/17 1412  PROT 6.9  ALBUMIN 4.3  AST 21  ALT 18  ALKPHOS 73  BILITOT 0.8   PT/INR No results for input(s): LABPROT, INR in the last 72 hours.  Studies/Results: Ct Abdomen Pelvis W Contrast  Result Date: 03/03/2017 CLINICAL DATA:  Lower abdominal pain and distension EXAM: CT ABDOMEN AND PELVIS WITH CONTRAST TECHNIQUE: Multidetector CT imaging of the abdomen and pelvis was performed using the standard protocol following  bolus administration of intravenous contrast. CONTRAST:  155mL ISOVUE-300 IOPAMIDOL (ISOVUE-300) INJECTION 61% COMPARISON:  12/24/2016 FINDINGS: Lower chest: No acute abnormality. Hepatobiliary: Gallbladder is within normal limits. Scattered cystic lesions are noted throughout the liver. No dominant mass is seen. Pancreas: Unremarkable. No pancreatic ductal dilatation or surrounding inflammatory changes. Spleen: Normal in size without focal abnormality. Adrenals/Urinary Tract: Adrenal glands within normal limits. No renal or ureteral calculi are seen. No obstructive changes are noted. Bladder is partially distended. Stomach/Bowel: Postsurgical changes are noted in the rectosigmoid region consistent with the given clinical history. Proximal to this there is a considerable amount of retained fecal material consistent with constipation and obstruction. There appears to be a degree of narrowing at the previous surgical site. Direct visualization is recommended. No significant small bowel dilatation is noted. There is a transient intussusception within the small bowel in the left mid abdomen likely within the jejunum. The appendix has been surgically removed. Vascular/Lymphatic: No significant vascular findings are present. No enlarged abdominal or pelvic lymph nodes. Reproductive: Uterus and bilateral adnexa are unremarkable. Other: No abdominal wall hernia or abnormality. No abdominopelvic ascites. Musculoskeletal: No acute or significant osseous findings. IMPRESSION: Apparent narrowing at the previous surgical site within the rectosigmoid with proximal dilatation of the colon and retained fecal material. Direct visualization is recommended. Transient intussusception within the left  mid abdomen likely within the jejunum. No obstructive changes are noted. Electronically Signed   By: Inez Catalina M.D.   On: 03/03/2017 15:41       Assessment / Plan:     #1 46 yo female with initial dx of Colon cancer 2016- s/p LAR  01/2015  With 1/44 nodes +. She had Colostomy reversal 10/2015 after f/u colonoscopy showed no disease or stricture . She was treated with chemotherapy.  Follow up Ct 01/2016  Showed multiple intra abdominal  Masses R and L  upper quadrants and disease near her colorectal anastomosis. 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  She has been getting SMOG enemas to attempt to clear lower colon , with some results though not great  Vomiting off and on since last PM- ? developing complete obstruction - will check plain films this am  Continue SMOG enemas and if no definite obstruction on plain films will start Miralax several  doses per day to try to liquify stool above stricture. 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

## 2017-03-06 ENCOUNTER — Encounter (HOSPITAL_COMMUNITY)
Admission: EM | Disposition: A | Discharge status: Home Health | Payer: Self-pay | Source: Home / Self Care | Attending: Internal Medicine

## 2017-03-06 ENCOUNTER — Encounter (HOSPITAL_COMMUNITY): Payer: Self-pay | Admitting: *Deleted

## 2017-03-06 DIAGNOSIS — R110 Nausea: Secondary | ICD-10-CM

## 2017-03-06 HISTORY — PX: FLEXIBLE SIGMOIDOSCOPY: SHX5431

## 2017-03-06 LAB — COMPREHENSIVE METABOLIC PANEL
ALT: 14 U/L (ref 14–54)
AST: 19 U/L (ref 15–41)
Albumin: 3.9 g/dL (ref 3.5–5.0)
Alkaline Phosphatase: 63 U/L (ref 38–126)
Anion gap: 6 (ref 5–15)
BUN: 9 mg/dL (ref 6–20)
CHLORIDE: 101 mmol/L (ref 101–111)
CO2: 29 mmol/L (ref 22–32)
Calcium: 9 mg/dL (ref 8.9–10.3)
Creatinine, Ser: 0.57 mg/dL (ref 0.44–1.00)
Glucose, Bld: 97 mg/dL (ref 65–99)
POTASSIUM: 3.9 mmol/L (ref 3.5–5.1)
Sodium: 136 mmol/L (ref 135–145)
Total Bilirubin: 1.2 mg/dL (ref 0.3–1.2)
Total Protein: 6.7 g/dL (ref 6.5–8.1)

## 2017-03-06 LAB — CBC
HEMATOCRIT: 39.2 % (ref 36.0–46.0)
Hemoglobin: 13.4 g/dL (ref 12.0–15.0)
MCH: 30.2 pg (ref 26.0–34.0)
MCHC: 34.2 g/dL (ref 30.0–36.0)
MCV: 88.3 fL (ref 78.0–100.0)
Platelets: 273 10*3/uL (ref 150–400)
RBC: 4.44 MIL/uL (ref 3.87–5.11)
RDW: 12.2 % (ref 11.5–15.5)
WBC: 7.9 10*3/uL (ref 4.0–10.5)

## 2017-03-06 SURGERY — SIGMOIDOSCOPY, FLEXIBLE
Anesthesia: Moderate Sedation

## 2017-03-06 MED ORDER — SPOT INK MARKER SYRINGE KIT
PACK | SUBMUCOSAL | Status: DC | PRN
  Administered 2017-03-06: 1.5 mL via SUBMUCOSAL

## 2017-03-06 MED ORDER — FLEET ENEMA 7-19 GM/118ML RE ENEM
2.0000 | ENEMA | Freq: Once | RECTAL | Status: AC
  Administered 2017-03-06: 2 via RECTAL
  Filled 2017-03-06: qty 2

## 2017-03-06 MED ORDER — FENTANYL CITRATE (PF) 100 MCG/2ML IJ SOLN
INTRAMUSCULAR | Status: AC
  Filled 2017-03-06: qty 2

## 2017-03-06 MED ORDER — MIDAZOLAM HCL 5 MG/ML IJ SOLN
INTRAMUSCULAR | Status: AC
  Filled 2017-03-06: qty 2

## 2017-03-06 MED ORDER — SPOT INK MARKER SYRINGE KIT
PACK | SUBMUCOSAL | Status: AC
  Filled 2017-03-06: qty 5

## 2017-03-06 MED ORDER — MIDAZOLAM HCL 10 MG/2ML IJ SOLN
INTRAMUSCULAR | Status: DC | PRN
  Administered 2017-03-06: 2 mg via INTRAVENOUS
  Administered 2017-03-06 (×2): 1 mg via INTRAVENOUS

## 2017-03-06 MED ORDER — PROMETHAZINE HCL 25 MG/ML IJ SOLN
12.5000 mg | INTRAMUSCULAR | Status: DC | PRN
  Administered 2017-03-06 – 2017-03-07 (×2): 12.5 mg via INTRAVENOUS
  Filled 2017-03-06 (×2): qty 1

## 2017-03-06 MED ORDER — DIPHENHYDRAMINE HCL 50 MG/ML IJ SOLN
INTRAMUSCULAR | Status: AC
  Filled 2017-03-06: qty 1

## 2017-03-06 MED ORDER — FLEET ENEMA 7-19 GM/118ML RE ENEM: 2.0000 | ENEMA | Freq: Once | RECTAL | Status: DC

## 2017-03-06 NOTE — H&P (View-Only) (Signed)
Patient ID: Lamira Borin, female   DOB: December 29, 1970, 46 y.o.   MRN: [id number]     Progress Note   Subjective   Denies any significant abdominal pain/discomfort, but has been vomiting mor frequently - vomited  after miralax twice - not able to take any other pos's Has not had anything to eat since Monday- Minimal results with SMOG enemas yesterday    Objective   Vital signs in last 24 hours: Temp:  [98 F (36.7 C)-99 F (37.2 C)] 99 F (37.2 C) (12/13 0427) Pulse Rate:  [64-74] 74 (12/13 0427) Resp:  [16-20] 16 (12/13 0427) BP: (106-130)/(61-86) 126/85 (12/13 0427) SpO2:  [95 %-98 %] 95 % (12/13 0427) Last BM Date: 03/05/17 General:    white female in NAD Heart:  Regular rate and rhythm; no murmurs Lungs: Respirations even and unlabored, lungs CTA bilaterally Abdomen:  Soft, full feeling, not visibly distended .  BS somewhat hyperactive, no palp mass Extremities:  Without edema. Neurologic:  Alert and oriented,  grossly normal neurologically. Psych:  Cooperative. Normal mood and affect.  Intake/Output from previous day: 12/12 0701 - 12/13 0700 In: 1608.3 [P.O.:360; I.V.:1248.3] Out: -  Intake/Output this shift: No intake/output data recorded.  Lab Results: Recent Labs    03/03/17 1412 03/04/17 0500 03/06/17 0458  WBC 9.1 7.7 7.9  HGB 13.5 13.3 13.4  HCT 39.1 39.5 39.2  PLT 264 256 273   BMET Recent Labs    03/03/17 1412 03/04/17 0500 03/05/17 0521  NA 139 142 140  K 3.5 4.0 3.7  CL 107 108 107  CO2 25 25 24   GLUCOSE 92 87 84  BUN 13 13 10   CREATININE 0.61 0.69 0.63  CALCIUM 9.4 9.3 8.9   LFT Recent Labs    03/03/17 1412  PROT 6.9  ALBUMIN 4.3  AST 21  ALT 18  ALKPHOS 73  BILITOT 0.8   PT/INR No results for input(s): LABPROT, INR in the last 72 hours.  Studies/Results: Dg Abd Acute W/chest  Result Date: 03/05/2017 CLINICAL DATA:  Abdominal pain EXAM: DG ABDOMEN ACUTE W/ 1V CHEST COMPARISON:  03/03/2017 FINDINGS: Cardiac shadow is within  normal limits. Right-sided chest wall port is noted in satisfactory position. The lungs are well-aerated without focal infiltrate or sizable effusion. Scattered large and small bowel gas is noted. Air-fluid levels are noted within the colon new from the prior exam with an overall decrease in the amount of fecal material within the colon. There remains considerable fecal material within the right colon. No free air is seen. Surgical changes in the pelvis are again noted. No bony abnormality is seen. IMPRESSION: Overall decrease in the amount of fecal material within the colon. This is particularly noted in the transverse colon and descending colon. No acute intrathoracic abnormality is noted. Electronically Signed   By: Inez Catalina M.D.   On: 03/05/2017 10:33       Assessment / Plan:    #1 46 yo WF with hx of metastatic Colon cancer  With carcinomatosis - s/p HIPEC, omentectomy, BSO, lysis of adhesions  07/2016 followed by a course of  FOLFOX completed 10/2016. Now presents with inability to have BM x one month- CT shows a stricture at her rectosigmoid anastomosis . Unclear whether this is a benign vs malignant process  Functionally she is acting like she is developing bowel obstruction - unable to keep down pos's   Not making much progress with Enemas and Miralax - she cannot tolerate a bowel prep  Will plan for Flex sigmoid this afternoon with Dr Havery Moros - Continue Miralax 4 x daily - stop SMOG enemas - will give fleets x 2  Before procedure  She may need  Surgery   #2 nutrition - will need to address nutrition soon - will wait and see what Flex shows .      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

## 2017-03-06 NOTE — Interval H&P Note (Signed)
History and Physical Interval Note:  03/06/2017 3:09 PM  Carla Mills  has presented today for surgery, with the diagnosis of hx colon cancer - sigmoid stricture  The various methods of treatment have been discussed with the patient and family. After consideration of risks, benefits and other options for treatment, the patient has consented to  Procedure(s): FLEXIBLE SIGMOIDOSCOPY (N/A) as a surgical intervention .  [name] history has been reviewed, patient examined, no change in status, stable for surgery.  I have reviewed the patient's chart and labs.  Questions were answered to the patient's satisfaction.     [name]

## 2017-03-06 NOTE — Progress Notes (Signed)
Patient ID: Carla Mills, female   DOB: [birth year], 46 y.o.   MRN: [id number]     Progress Note   Subjective   Denies any significant abdominal pain/discomfort, but has been vomiting mor frequently - vomited  after miralax twice - not able to take any other pos's Has not had anything to eat since Monday- Minimal results with SMOG enemas yesterday    Objective   Vital signs in last 24 hours: Temp:  [98 F (36.7 C)-99 F (37.2 C)] 99 F (37.2 C) (12/13 0427) Pulse Rate:  [64-74] 74 (12/13 0427) Resp:  [16-20] 16 (12/13 0427) BP: (106-130)/(61-86) 126/85 (12/13 0427) SpO2:  [95 %-98 %] 95 % (12/13 0427) Last BM Date: 03/05/17 General:    white female in NAD Heart:  Regular rate and rhythm; no murmurs Lungs: Respirations even and unlabored, lungs CTA bilaterally Abdomen:  Soft, full feeling, not visibly distended .  BS somewhat hyperactive, no palp mass Extremities:  Without edema. Neurologic:  Alert and oriented,  grossly normal neurologically. Psych:  Cooperative. Normal mood and affect.  Intake/Output from previous day: 12/12 0701 - 12/13 0700 In: 1608.3 [P.O.:360; I.V.:1248.3] Out: -  Intake/Output this shift: No intake/output data recorded.  Lab Results: Recent Labs    03/03/17 1412 03/04/17 0500 03/06/17 0458  WBC 9.1 7.7 7.9  HGB 13.5 13.3 13.4  HCT 39.1 39.5 39.2  PLT 264 256 273   BMET Recent Labs    03/03/17 1412 03/04/17 0500 03/05/17 0521  NA 139 142 140  K 3.5 4.0 3.7  CL 107 108 107  CO2 25 25 24   GLUCOSE 92 87 84  BUN 13 13 10   CREATININE 0.61 0.69 0.63  CALCIUM 9.4 9.3 8.9   LFT Recent Labs    03/03/17 1412  PROT 6.9  ALBUMIN 4.3  AST 21  ALT 18  ALKPHOS 73  BILITOT 0.8   PT/INR No results for input(s): LABPROT, INR in the last 72 hours.  Studies/Results: Dg Abd Acute W/chest  Result Date: 03/05/2017 CLINICAL DATA:  Abdominal pain EXAM: DG ABDOMEN ACUTE W/ 1V CHEST COMPARISON:  03/03/2017 FINDINGS: Cardiac shadow is within  normal limits. Right-sided chest wall port is noted in satisfactory position. The lungs are well-aerated without focal infiltrate or sizable effusion. Scattered large and small bowel gas is noted. Air-fluid levels are noted within the colon new from the prior exam with an overall decrease in the amount of fecal material within the colon. There remains considerable fecal material within the right colon. No free air is seen. Surgical changes in the pelvis are again noted. No bony abnormality is seen. IMPRESSION: Overall decrease in the amount of fecal material within the colon. This is particularly noted in the transverse colon and descending colon. No acute intrathoracic abnormality is noted. Electronically Signed   By: Inez Catalina M.D.   On: 03/05/2017 10:33       Assessment / Plan:    #1 46 yo WF with hx of metastatic Colon cancer  With carcinomatosis - s/p HIPEC, omentectomy, BSO, lysis of adhesions  07/2016 followed by a course of  FOLFOX completed 10/2016. Now presents with inability to have BM x one month- CT shows a stricture at her rectosigmoid anastomosis . Unclear whether this is a benign vs malignant process  Functionally she is acting like she is developing bowel obstruction - unable to keep down pos's   Not making much progress with Enemas and Miralax - she cannot tolerate a bowel prep  Will plan for Flex sigmoid this afternoon with Dr Havery Moros - Continue Miralax 4 x daily - stop SMOG enemas - will give fleets x 2  Before procedure  She may need  Surgery   #2 nutrition - will need to address nutrition soon - will wait and see what Flex shows .      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

## 2017-03-06 NOTE — Progress Notes (Signed)
[name] still is awaiting her colonoscopy.  She cannot have one until she has a little bit more cleanout.  It seems like this is improving a little bit.  She has a little bit more nausea.  Hopefully, she will be able to ambulate a little bit more and this can help with the bowel movements.  I spoke with her surgical oncologist at [facility].  He thinks this is not a malignant process but more anatomic where she had her initial surgery a couple years ago.  Lab work that was done today showed a white cell count of 7.9.  Hemoglobin 13.4.  Platelet count 273,000.  She has had no fever.  She has had no cough or shortness of breath.  She has had no bleeding.  She has had no dysuria.  On her physical exam, her vital signs all look stable.  Her lungs are clear.  Cardiac exam regular rate and rhythm.  Abdomen is soft.  Bowel sounds are somewhat hyperactive.  There is no abdominal distention.  She has some tenderness to palpation in the hypoumbilical region.  There is no obvious abdominal mass.  Extremities shows no clubbing, cyanosis or edema.  We will continue to follow along.  Hopefully, she will have the colonoscopy tomorrow.  Again, may be if she ambulates this will help "loosen" things up and she might be able to go a little bit better so there will be better visualization of the stricture.  Lattie Haw, MD  Oswaldo Milian 52:7

## 2017-03-06 NOTE — Op Note (Signed)
[facility] Patient Name: Carla Mills Procedure Date: 03/06/2017 MRN: [id number] Attending MD: Carlota Raspberry. Saul Fabiano MD, MD Date of Birth: [birth year] CSN: [id number] Age: 46 Admit Type: Inpatient Procedure:                Flexible Sigmoidoscopy Indications:              Abnormal CT of the GI tract, recto-sigmoid                            stricture noted, severe constipation, history of                            colon cancer Providers:                Carlota Raspberry. Idelia Caudell MD, MD, Cleda Daub, RN,                            Elspeth Cho Tech., Technician Referring MD:              Medicines:                Midazolam 4 mg IV Complications:            No immediate complications. Estimated blood loss:                            Minimal. Estimated Blood Loss:     Estimated blood loss was minimal. Procedure:                Pre-Anesthesia Assessment:                           - Prior to the procedure, a History and Physical                            was performed, and patient medications and                            allergies were reviewed. [name] tolerance of                            previous anesthesia was also reviewed. The risks                            and benefits of the procedure and the sedation                            options and risks were discussed with the patient.                            All questions were answered, and informed consent                            was obtained. Prior Anticoagulants: The patient has                            taken  no previous anticoagulant or antiplatelet                            agents. ASA Grade Assessment: III - A patient with                            severe systemic disease. After reviewing the risks                            and benefits, the patient was deemed in                            satisfactory condition to undergo the procedure.                           After obtaining informed  consent, the scope was                            passed under direct vision. The EG-[id number] ([id number])                            scope was introduced through the anus and advanced                            to the the rectum. The flexible sigmoidoscopy was                            accomplished without difficulty. The patient                            tolerated the procedure well. The quality of the                            bowel preparation was adequate. Scope In: Scope Out: Findings:      The perianal and digital rectal examinations were normal.      A severe stenosis was found in the rectum and was non-traversed, roughly       13-15cm from the anal verge. The stricture could not be traversed, could       not see the proximal side of it. The tissue within the stricture was       deeply ulcerated and friable with contact hemorrhage, and nodular,       concerning for malignancy. Biopsies were taken with a cold forceps for       histology. Area just distal to the stricture was tattooed with an       injection of Spot (carbon black). Retroflexion not performed. Distal       rectum appeared normal. Impression:               - Stricture in the rectum as described above,                            concerning for malignant stricture. Biopsied.  Tattooed distally. Moderate Sedation:      Moderate (conscious) sedation was administered by the endoscopy nurse       and supervised by the endoscopist. The following parameters were       monitored: oxygen saturation, heart rate, blood pressure, and response       to care. Total physician intraservice time was 20 minutes. Recommendation:           - Return patient to hospital ward for ongoing care.                           - Continue present medications.                           - Await pathology results                           - Will discuss case with surgical team Procedure Code(s):        --- Professional ---                            [phone], 52, Sigmoidoscopy, flexible; with biopsy,                            single or multiple                           [id number], 52, Sigmoidoscopy, flexible; with directed                            submucosal injection(s), any substance                           99152, Moderate sedation services provided by the                            same physician or other qualified health care                            professional performing the diagnostic or                            therapeutic service that the sedation supports,                            requiring the presence of an independent trained                            observer to assist in the monitoring of the                            patient's level of consciousness and physiological                            status; initial 15 minutes of intraservice time,  patient age 4 years or older Diagnosis Code(s):        --- Professional ---                           K62.4, Stenosis of anus and rectum                           R93.3, Abnormal findings on diagnostic imaging of                            other parts of digestive tract CPT copyright 2016 American Medical Association. All rights reserved. The codes documented in this report are preliminary and upon coder review may  be revised to meet current compliance requirements. Remo Lipps P. Maison Kestenbaum MD, MD 03/06/2017 3:44:54 PM This report has been signed electronically. Number of Addenda: 0

## 2017-03-06 NOTE — Progress Notes (Signed)
Smog enema given in intervals so allow patient to be able to hold in longer for better results. Small amount of stool output with little relief. Continue to monitor.

## 2017-03-06 NOTE — Progress Notes (Signed)
    CC:  Nausea and vomiting, abdominal pain    Subjective: Feels a little better, tired of enemas, awaiting GI.  Objective: Vital signs in last 24 hours: Temp:  [98 F (36.7 C)-99 F (37.2 C)] 99 F (37.2 C) (12/13 0427) Pulse Rate:  [64-74] 74 (12/13 0427) Resp:  [16-20] 16 (12/13 0427) BP: (106-130)/(61-86) 126/85 (12/13 0427) SpO2:  [95 %-98 %] 95 % (12/13 0427) Last BM Date: 03/05/17 Voided x 4 Emesis x 1 BM x 5 recorded yesterday Afebrile, VSS Labs OK this AM  Intake/Output from previous day: 12/12 0701 - 12/13 0700 In: 1608.3 [P.O.:360; I.V.:1248.3] Out: -  Intake/Output this shift: No intake/output data recorded.  General appearance: alert, cooperative and no distress GI: soft, non tender, BS hypoacitive, multiple BM's with enemas  Lab Results:  Recent Labs    03/04/17 0500 03/06/17 0458  WBC 7.7 7.9  HGB 13.3 13.4  HCT 39.5 39.2  PLT 256 273    BMET Recent Labs    03/04/17 0500 03/05/17 0521  NA 142 140  K 4.0 3.7  CL 108 107  CO2 25 24  GLUCOSE 87 84  BUN 13 10  CREATININE 0.69 0.63  CALCIUM 9.3 8.9   PT/INR No results for input(s): LABPROT, INR in the last 72 hours.  Recent Labs  Lab 02/27/17 1501 03/03/17 1412  AST 22 21  ALT 24 18  ALKPHOS 76 73  BILITOT 0.80 0.8  PROT 6.9 6.9  ALBUMIN 4.1 4.3     Lipase     Component Value Date/Time   LIPASE 41 05/24/2016 1648     Medications: . feeding supplement  1 Container Oral TID BM  . fluticasone  1 spray Each Nare BID  . polyethylene glycol  17 g Oral QID  . sorbitol, milk of mag, mineral oil, glycerin (SMOG) enema  960 mL Rectal TID   . 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 Rosendo Gros - last dose 10/2016. Followed by Dr. Marin Olp.  - tolerating CLD  - GI consulted: performing bowel prep and planning for possible flex sig this hospitalization to r/o recurrent malignancy and possibly perform dilation of suspected stricture.Consider PO mag citrate/miralax if enemas fail. - No acute surgical needs. General surgery will follow and make further recommendations should above proposed treatment by GI fail.Will notify Dr. Rosendo Gros of patient admission and abnormal CT.  FEN: Clear liquids/IV fluids ID:  None DVT:  SCD Foley:  None Follow up: TBD  Plan:  Stable, will follow and await GI evaluation.         LOS: 2 days    Mirel Hundal 03/06/2017 [phone]

## 2017-03-06 NOTE — Progress Notes (Signed)
[facility] PROGRESS NOTE  Carla Mills BTD:[id number] DOB: 07-23-1970 DOA: 03/03/2017  PCP: Patient, No Pcp Per  Brief History/Interval Summary: 46 year old Caucasian female with a past medical history of colon cancer status post bowel resection and chemotherapy presented with complains of worsening abdominal pain.  Evaluation revealed a possible stricture at the anastomotic site of the colon.  Patient was hospitalized for further management.  Reason for Visit: Colonic stricture  Consultants: Gastroenterology.  General surgery.  Oncology  Procedures: None yet  Antibiotics: None  Subjective/Interval History: Patient continues to have episodes of vomiting.  Continues to have some abdominal discomfort at times.    ROS: Denies any chest pain or shortness of breath  Objective:  Vital Signs  Vitals:   03/05/17 0448 03/05/17 1430 03/05/17 2016 03/06/17 0427  BP: 120/73 106/61 130/86 126/85  Pulse: 81 64 71 74  Resp: 18 20 18 16   Temp: 98.6 F (37 C) 98.9 F (37.2 C) 98 F (36.7 C) 99 F (37.2 C)  TempSrc: Oral Oral Oral Oral  SpO2: 96% 96% 98% 95%  Weight:      Height:        Intake/Output Summary (Last 24 hours) at 03/06/2017 1033 Last data filed at 03/06/2017 0300 Gross per 24 hour  Intake 1608.33 ml  Output -  Net 1608.33 ml   Filed Weights   03/03/17 1352 03/03/17 2216  Weight: 71.7 kg (158 lb) 69.6 kg (153 lb 6.4 oz)    General appearance: Awake alert.  In no distress. Resp: Clear to auscultation bilaterally. Cardio: S1-S2 normal regular.  No S3 or S4.  No rubs murmurs or bruit. GI: Diminished soft.  Mildly tender in the left lower quadrant.  No rebound rigidity or guarding. Extremities: No edema Neurologic: No focal deficits.  Lab Results:  Data Reviewed: I have personally reviewed following labs and imaging studies  CBC: Recent Labs  Lab 02/27/17 1501 03/03/17 1412 03/04/17 0500 03/06/17 0458  WBC 7.2 9.1 7.7 7.9  NEUTROABS 4.7 6.8   --   --   HGB 13.6 13.5 13.3 13.4  HCT 39.4 39.1 39.5 39.2  MCV 89 88.1 89.2 88.3  PLT 253 264 256 106    Basic Metabolic Panel: Recent Labs  Lab 02/27/17 1501 03/03/17 1412 03/04/17 0500 03/05/17 0521  NA 147* 139 142 140  K 3.6 3.5 4.0 3.7  CL 103 107 108 107  CO2 28 25 25 24   GLUCOSE 100 92 87 84  BUN 14 13 13 10   CREATININE 0.9 0.61 0.69 0.63  CALCIUM 9.6 9.4 9.3 8.9  MG  --   --   --  1.8    GFR: Estimated Creatinine Clearance: 86 mL/min (by C-G formula based on SCr of 0.63 mg/dL).  Liver Function Tests: Recent Labs  Lab 02/27/17 1501 03/03/17 1412  AST 22 21  ALT 24 18  ALKPHOS 76 73  BILITOT 0.80 0.8  PROT 6.9 6.9  ALBUMIN 4.1 4.3     Radiology Studies: Dg Abd Acute W/chest  Result Date: 03/05/2017 CLINICAL DATA:  Abdominal pain EXAM: DG ABDOMEN ACUTE W/ 1V CHEST COMPARISON:  03/03/2017 FINDINGS: Cardiac shadow is within normal limits. Right-sided chest wall port is noted in satisfactory position. The lungs are well-aerated without focal infiltrate or sizable effusion. Scattered large and small bowel gas is noted. Air-fluid levels are noted within the colon new from the prior exam with an overall decrease in the amount of fecal material within the colon. There remains considerable fecal material  within the right colon. No free air is seen. Surgical changes in the pelvis are again noted. No bony abnormality is seen. IMPRESSION: Overall decrease in the amount of fecal material within the colon. This is particularly noted in the transverse colon and descending colon. No acute intrathoracic abnormality is noted. Electronically Signed   By: Inez Catalina M.D.   On: 03/05/2017 10:33     Medications:  Scheduled: . feeding supplement  1 Container Oral TID BM  . fluticasone  1 spray Each Nare BID  . polyethylene glycol  17 g Oral QID  . [START ON 03/07/2017] sodium phosphate  2 enema Rectal Once   Continuous: . 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  Patient seen by gastroenterology and general surgery.  She is receiving enemas and laxatives.  Plan is to do flexible sigmoidoscopy to begin with.  Tentatively planned for tomorrow.  Continue IV fluids.  Symptomatic treatment.   History of colon cancer status post bowel resection status post chemotherapy Outpatient follow-up with oncology.   DVT Prophylaxis: SCDs Code Status: Full Code Family Communication: Discussed with the patient Disposition Plan: Management as outlined above    LOS: 2 days   [place] [facility] Pager [phone] 03/06/2017, 10:33 AM  If 7PM-7AM, please contact night-coverage at [email or web address], password [facility]

## 2017-03-07 ENCOUNTER — Encounter (HOSPITAL_COMMUNITY)
Admission: EM | Disposition: A | Discharge status: Home Health | Payer: Self-pay | Source: Home / Self Care | Attending: Internal Medicine

## 2017-03-07 ENCOUNTER — Inpatient Hospital Stay (HOSPITAL_COMMUNITY): Payer: BLUE CROSS/BLUE SHIELD | Admitting: Anesthesiology

## 2017-03-07 ENCOUNTER — Encounter (HOSPITAL_COMMUNITY): Payer: Self-pay

## 2017-03-07 DIAGNOSIS — R140 Abdominal distension (gaseous): Secondary | ICD-10-CM

## 2017-03-07 HISTORY — PX: COLON RESECTION: SHX5231

## 2017-03-07 LAB — BASIC METABOLIC PANEL
Anion gap: 8 (ref 5–15)
BUN: 8 mg/dL (ref 6–20)
CALCIUM: 8.9 mg/dL (ref 8.9–10.3)
CO2: 28 mmol/L (ref 22–32)
Chloride: 98 mmol/L — ABNORMAL LOW (ref 101–111)
Creatinine, Ser: 0.62 mg/dL (ref 0.44–1.00)
GFR calc Af Amer: >60 mL/min (ref >60)
GLUCOSE: 116 mg/dL — AB (ref 65–99)
Potassium: 4.2 mmol/L (ref 3.5–5.1)
Sodium: 134 mmol/L — ABNORMAL LOW (ref 135–145)

## 2017-03-07 LAB — PROTIME-INR
INR: 1.04
PROTHROMBIN TIME: 13.5 s (ref 11.4–15.2)

## 2017-03-07 LAB — TYPE AND SCREEN
ABO/RH(D): O POS
ANTIBODY SCREEN: NEGATIVE

## 2017-03-07 LAB — CBC
HCT: 40.2 % (ref 36.0–46.0)
Hemoglobin: 13.8 g/dL (ref 12.0–15.0)
MCH: 30.2 pg (ref 26.0–34.0)
MCHC: 34.3 g/dL (ref 30.0–36.0)
MCV: 88 fL (ref 78.0–100.0)
Platelets: 268 10*3/uL (ref 150–400)
RBC: 4.57 MIL/uL (ref 3.87–5.11)
RDW: 12.2 % (ref 11.5–15.5)
WBC: 7.6 10*3/uL (ref 4.0–10.5)

## 2017-03-07 LAB — PREALBUMIN: PREALBUMIN: 16.6 mg/dL — AB (ref 18–38)

## 2017-03-07 LAB — APTT: aPTT: 27 seconds (ref 24–36)

## 2017-03-07 SURGERY — COLON RESECTION
Anesthesia: General | Site: Abdomen

## 2017-03-07 MED ORDER — PROMETHAZINE HCL 25 MG/ML IJ SOLN
12.5000 mg | Freq: Four times a day (QID) | INTRAMUSCULAR | Status: DC | PRN
  Administered 2017-03-08: 12.5 mg via INTRAVENOUS
  Filled 2017-03-07: qty 1

## 2017-03-07 MED ORDER — SCOPOLAMINE 1 MG/3DAYS TD PT72
MEDICATED_PATCH | TRANSDERMAL | Status: DC | PRN
  Administered 2017-03-07: 1 via TRANSDERMAL

## 2017-03-07 MED ORDER — ONDANSETRON HCL 4 MG PO TABS: 4.0000 mg | ORAL_TABLET | Freq: Four times a day (QID) | ORAL | Status: DC | PRN

## 2017-03-07 MED ORDER — MORPHINE SULFATE (PF) 4 MG/ML IV SOLN
4.0000 mg | INTRAVENOUS | Status: AC | PRN
  Administered 2017-03-07 – 2017-03-08 (×3): 4 mg via INTRAVENOUS
  Filled 2017-03-07 (×3): qty 1

## 2017-03-07 MED ORDER — ONDANSETRON HCL 4 MG/2ML IJ SOLN
4.0000 mg | Freq: Four times a day (QID) | INTRAMUSCULAR | Status: DC | PRN
  Administered 2017-03-07 – 2017-03-08 (×2): 4 mg via INTRAVENOUS
  Filled 2017-03-07 (×2): qty 2

## 2017-03-07 MED ORDER — KCL IN DEXTROSE-NACL 20-5-0.45 MEQ/L-%-% IV SOLN
INTRAVENOUS | Status: DC
  Administered 2017-03-07 – 2017-03-09 (×4): via INTRAVENOUS
  Filled 2017-03-07 (×4): qty 1000

## 2017-03-07 MED ORDER — MORPHINE SULFATE (PF) 2 MG/ML IV SOLN
1.0000 mg | INTRAVENOUS | Status: DC | PRN
  Administered 2017-03-07: 2 mg via INTRAVENOUS
  Filled 2017-03-07: qty 1

## 2017-03-07 MED ORDER — DEXTROSE 5 % IV SOLN
2.0000 g | INTRAVENOUS | Status: AC
  Administered 2017-03-07: 2 g via INTRAVENOUS
  Filled 2017-03-07: qty 2

## 2017-03-07 MED ORDER — LIDOCAINE 2% (20 MG/ML) 5 ML SYRINGE
INTRAMUSCULAR | Status: AC
  Filled 2017-03-07: qty 5

## 2017-03-07 MED ORDER — DEXAMETHASONE SODIUM PHOSPHATE 10 MG/ML IJ SOLN
INTRAMUSCULAR | Status: AC
  Filled 2017-03-07: qty 1

## 2017-03-07 MED ORDER — SCOPOLAMINE 1 MG/3DAYS TD PT72
MEDICATED_PATCH | TRANSDERMAL | Status: AC
  Filled 2017-03-07: qty 1

## 2017-03-07 MED ORDER — DEXAMETHASONE SODIUM PHOSPHATE 10 MG/ML IJ SOLN
INTRAMUSCULAR | Status: DC | PRN
  Administered 2017-03-07: 10 mg via INTRAVENOUS

## 2017-03-07 MED ORDER — MIDAZOLAM HCL 2 MG/2ML IJ SOLN
INTRAMUSCULAR | Status: AC
  Filled 2017-03-07: qty 2

## 2017-03-07 MED ORDER — HEPARIN SODIUM (PORCINE) 5000 UNIT/ML IJ SOLN
5000.0000 [IU] | Freq: Three times a day (TID) | INTRAMUSCULAR | Status: DC
  Administered 2017-03-07 – 2017-03-08 (×2): 5000 [IU] via SUBCUTANEOUS
  Filled 2017-03-07 (×3): qty 1

## 2017-03-07 MED ORDER — MEPERIDINE HCL 50 MG/ML IJ SOLN: 6.2500 mg | INTRAMUSCULAR | Status: DC | PRN

## 2017-03-07 MED ORDER — SUGAMMADEX SODIUM 200 MG/2ML IV SOLN
INTRAVENOUS | Status: AC
  Filled 2017-03-07: qty 2

## 2017-03-07 MED ORDER — FENTANYL CITRATE (PF) 100 MCG/2ML IJ SOLN
INTRAMUSCULAR | Status: AC
Start: 2017-03-07 — End: 2017-03-07
  Filled 2017-03-07: qty 2

## 2017-03-07 MED ORDER — DEXTROSE 5 % IV SOLN
2.0000 g | Freq: Two times a day (BID) | INTRAVENOUS | Status: AC
  Administered 2017-03-07: 2 g via INTRAVENOUS
  Filled 2017-03-07: qty 2

## 2017-03-07 MED ORDER — ROCURONIUM BROMIDE 10 MG/ML (PF) SYRINGE
PREFILLED_SYRINGE | INTRAVENOUS | Status: DC | PRN
  Administered 2017-03-07: 50 mg via INTRAVENOUS

## 2017-03-07 MED ORDER — ONDANSETRON HCL 4 MG/2ML IJ SOLN: 4.0000 mg | Freq: Once | INTRAMUSCULAR | Status: DC | PRN

## 2017-03-07 MED ORDER — HYDROMORPHONE HCL 1 MG/ML IJ SOLN: 1.0000 mg | INTRAMUSCULAR | Status: DC | PRN

## 2017-03-07 MED ORDER — 0.9 % SODIUM CHLORIDE (POUR BTL) OPTIME
TOPICAL | Status: DC | PRN
  Administered 2017-03-07: 2000 mL

## 2017-03-07 MED ORDER — ACETAMINOPHEN 10 MG/ML IV SOLN
INTRAVENOUS | Status: DC | PRN
  Administered 2017-03-07: 1000 mg via INTRAVENOUS

## 2017-03-07 MED ORDER — LACTATED RINGERS IV SOLN
INTRAVENOUS | Status: DC
  Administered 2017-03-07: 1000 mL via INTRAVENOUS

## 2017-03-07 MED ORDER — ONDANSETRON HCL 4 MG/2ML IJ SOLN
INTRAMUSCULAR | Status: DC | PRN
  Administered 2017-03-07: 4 mg via INTRAVENOUS

## 2017-03-07 MED ORDER — ACETAMINOPHEN 10 MG/ML IV SOLN
INTRAVENOUS | Status: AC
  Filled 2017-03-07: qty 100

## 2017-03-07 MED ORDER — CEFOTETAN DISODIUM-DEXTROSE 2-2.08 GM-%(50ML) IV SOLR
INTRAVENOUS | Status: AC
  Filled 2017-03-07: qty 50

## 2017-03-07 MED ORDER — ONDANSETRON HCL 4 MG/2ML IJ SOLN
INTRAMUSCULAR | Status: AC
  Filled 2017-03-07: qty 2

## 2017-03-07 MED ORDER — FENTANYL CITRATE (PF) 100 MCG/2ML IJ SOLN
INTRAMUSCULAR | Status: DC | PRN
  Administered 2017-03-07 (×4): 50 ug via INTRAVENOUS

## 2017-03-07 MED ORDER — FENTANYL CITRATE (PF) 250 MCG/5ML IJ SOLN
INTRAMUSCULAR | Status: AC
  Filled 2017-03-07: qty 5

## 2017-03-07 MED ORDER — FENTANYL CITRATE (PF) 100 MCG/2ML IJ SOLN
25.0000 ug | INTRAMUSCULAR | Status: DC | PRN
  Administered 2017-03-07 (×2): 50 ug via INTRAVENOUS

## 2017-03-07 MED ORDER — PROPOFOL 10 MG/ML IV BOLUS
INTRAVENOUS | Status: DC | PRN
  Administered 2017-03-07: 150 mg via INTRAVENOUS

## 2017-03-07 MED ORDER — LIDOCAINE 2% (20 MG/ML) 5 ML SYRINGE
INTRAMUSCULAR | Status: DC | PRN
  Administered 2017-03-07: 100 mg via INTRAVENOUS

## 2017-03-07 MED ORDER — HYDRALAZINE HCL 20 MG/ML IJ SOLN: 10.0000 mg | INTRAMUSCULAR | Status: DC | PRN

## 2017-03-07 MED ORDER — ROCURONIUM BROMIDE 50 MG/5ML IV SOSY
PREFILLED_SYRINGE | INTRAVENOUS | Status: AC
  Filled 2017-03-07: qty 5

## 2017-03-07 MED ORDER — MIDAZOLAM HCL 5 MG/5ML IJ SOLN
INTRAMUSCULAR | Status: DC | PRN
  Administered 2017-03-07: 2 mg via INTRAVENOUS

## 2017-03-07 MED ORDER — SUGAMMADEX SODIUM 200 MG/2ML IV SOLN
INTRAVENOUS | Status: DC | PRN
  Administered 2017-03-07: 150 mg via INTRAVENOUS

## 2017-03-07 MED ORDER — PROPOFOL 10 MG/ML IV BOLUS
INTRAVENOUS | Status: AC
  Filled 2017-03-07: qty 20

## 2017-03-07 MED ORDER — LIDOCAINE 2% (20 MG/ML) 5 ML SYRINGE
INTRAMUSCULAR | Status: DC | PRN
  Administered 2017-03-07: 1 mg/kg/h via INTRAVENOUS

## 2017-03-07 SURGICAL SUPPLY — 46 items
APPLIER CLIP 5 13 M/L LIGAMAX5 (MISCELLANEOUS)
APPLIER CLIP ROT 10 11.4 M/L (STAPLE)
BLADE EXTENDED COATED 6.5IN (ELECTRODE) IMPLANT
CHLORAPREP W/TINT 26ML (MISCELLANEOUS) ×2 IMPLANT
CLIP APPLIE 5 13 M/L LIGAMAX5 (MISCELLANEOUS) IMPLANT
CLIP APPLIE ROT 10 11.4 M/L (STAPLE) IMPLANT
COUNTER NEEDLE 20 DBL MAG RED (NEEDLE) ×2 IMPLANT
COVER MAYO STAND STRL (DRAPES) ×6 IMPLANT
COVER SURGICAL LIGHT HANDLE (MISCELLANEOUS) ×2 IMPLANT
DECANTER SPIKE VIAL GLASS SM (MISCELLANEOUS) ×2 IMPLANT
DRAPE LAPAROSCOPIC ABDOMINAL (DRAPES) ×2 IMPLANT
DRSG OPSITE POSTOP 4X10 (GAUZE/BANDAGES/DRESSINGS) IMPLANT
DRSG OPSITE POSTOP 4X6 (GAUZE/BANDAGES/DRESSINGS) IMPLANT
DRSG OPSITE POSTOP 4X8 (GAUZE/BANDAGES/DRESSINGS) IMPLANT
ELECT PENCIL ROCKER SW 15FT (MISCELLANEOUS) IMPLANT
ELECT REM PT RETURN 15FT ADLT (MISCELLANEOUS) ×2 IMPLANT
GAUZE SPONGE 4X4 12PLY STRL (GAUZE/BANDAGES/DRESSINGS) IMPLANT
GLOVE BIOGEL M 8.0 STRL (GLOVE) ×4 IMPLANT
GOWN STRL REUS W/TWL XL LVL3 (GOWN DISPOSABLE) ×12 IMPLANT
KIT PREVENA INCISION MGT20CM45 (CANNISTER) ×2 IMPLANT
LEGGING LITHOTOMY PAIR STRL (DRAPES) ×2 IMPLANT
LIGASURE IMPACT 36 18CM CVD LR (INSTRUMENTS) ×2 IMPLANT
PACK COLON (CUSTOM PROCEDURE TRAY) ×2 IMPLANT
PAD POSITIONING PINK XL (MISCELLANEOUS) IMPLANT
POSITIONER SURGICAL ARM (MISCELLANEOUS) ×4 IMPLANT
SEALER TISSUE X1 CVD JAW (INSTRUMENTS) IMPLANT
STAPLER CUT CVD 40MM BLUE (STAPLE) ×2 IMPLANT
STAPLER VISISTAT 35W (STAPLE) ×2 IMPLANT
SUT CHROMIC 3 0 SH 27 (SUTURE) ×4 IMPLANT
SUT NOVA 1 T20/GS 25DT (SUTURE) ×2 IMPLANT
SUT PDS AB 1 CTX 36 (SUTURE) IMPLANT
SUT PDS AB 1 TP1 96 (SUTURE) ×4 IMPLANT
SUT PDS AB 4-0 SH 27 (SUTURE) IMPLANT
SUT PROLENE 2 0 KS (SUTURE) IMPLANT
SUT SILK 2 0 (SUTURE) ×1
SUT SILK 2 0 SH CR/8 (SUTURE) ×2 IMPLANT
SUT SILK 2-0 18XBRD TIE 12 (SUTURE) ×1 IMPLANT
SUT SILK 3 0 (SUTURE) ×1
SUT SILK 3 0 SH CR/8 (SUTURE) ×2 IMPLANT
SUT SILK 3-0 18XBRD TIE 12 (SUTURE) ×1 IMPLANT
SUT VIC AB 2-0 SH 18 (SUTURE) ×2 IMPLANT
SUT VIC AB 4-0 SH 18 (SUTURE) ×2 IMPLANT
TOWEL OR NON WOVEN STRL DISP B (DISPOSABLE) ×2 IMPLANT
TRAY FOLEY CATH 14FRSI W/METER (CATHETERS) ×2 IMPLANT
TRAY FOLEY W/METER SILVER 16FR (SET/KITS/TRAYS/PACK) IMPLANT
TUBING CONNECTING 10 (TUBING) IMPLANT

## 2017-03-07 NOTE — Progress Notes (Signed)
Patient ID: Carla Mills, female   DOB: January 17, 1971, 46 y.o.   MRN: [id number]   Brief GI note-  Pt heading to surgery this am  for resection and Colostomy  Bx from stricture  Shows  Invasive Adenocarcinoma.   GI will be available  If we can help.

## 2017-03-07 NOTE — Transfer of Care (Signed)
Immediate Anesthesia Transfer of Care Note  Patient: Jalasia Eskridge  Procedure(s) Performed: Procedure(s): EXPLORATORY LAPAROTOMY AND CREATION OF A COLOSTOMY AND MUCOUS FISTULA (N/A)  Patient Location: PACU  Anesthesia Type:General  Level of Consciousness:  sedated, patient cooperative and responds to stimulation  Airway & Oxygen Therapy:Patient Spontanous Breathing and Patient connected to face mask oxgen  Post-op Assessment:  Report given to PACU RN and Post -op Vital signs reviewed and stable  Post vital signs:  Reviewed and stable  Last Vitals:  Vitals:   03/06/17 2016 03/07/17 0442  BP: 120/85 (!) 122/95  Pulse: 67 69  Resp: 18 16  Temp: 36.9 C 36.8 C  SpO2: 08% 13%    Complications: No apparent anesthesia complications

## 2017-03-07 NOTE — Op Note (Signed)
Surgeon: Kaylyn Lim, MD, FACS  Asst:  Leighton Ruff, MD, FACS  Anes:  general  Preop Dx: Obstructing recurrent colon cancer in the pelvis  Postop Dx: Recurrent cancer in the pelvis with tumor shelf appearing to grow into the vagina  Procedure: Laparotomy, creation of a mucous fistula in the inferior aspect of the incision with end colostomy Location Surgery: [facility] 1 Complications: none  EBL:   20 cc  Drains: none  Description of Procedure:  The patient was taken to OR 1 .  After anesthesia was administered and the patient was prepped a timeout was performed.  A lower midline incision was made through her previous incision into the suprapubic region.  Entered the abdomen without difficulty and there were surprising scarcity of adhesions to the anterior abdominal wall.  The small intestine and colon however were markedly distended.  This somewhat complicated the procedure with the exposure compromise.  However we packed off the small bowel after lysing a few adhesions and I then followed the colon down to the peritoneal reflection inferiorly.  This was down below the vagina and uterus down at the level of the vagina there is a hard firm shelf where the apparent stricture was and this appeared to be growing anteriorly was not mobile.  As such it was not resectable and we elected to go up in the mid sigmoid colon and divided the bowel with a contour stapler blue load.  I was able to mobilize the distal portion enough to get a corner of it out through the edge of the lower midline incision.  A separate ostomy which had been previously been marked by the colostomy nurse was used and the tract dilated in the colon was brought up into the through the hole into the skin.  Fascial closure was affected with #1 no refills interrupted inferiorly to isolate the mucous fistula which was also matured to the skin with 4-0 Vicryl.  The fascia was closed with some intermittent #1 Novafil's and with a running  double-stranded PDS.  Wound was irrigated and closed with staples and the Praveena was placed on top of that.  The end colostomy was then opened and was matured to the skin with 3-0 chromic in a running locking fashion suturing either ends and tied to each other.  The patient tolerated the procedure well and was taken to the PACU in stable condition.     Matt B. Hassell Done, [place], [facility], [place]

## 2017-03-07 NOTE — Anesthesia Procedure Notes (Signed)
Procedure Name: Intubation Date/Time: 03/07/2017 1:53 PM Performed by: Victoriano Lain, CRNA Pre-anesthesia Checklist: Patient identified, Emergency Drugs available, Suction available, Patient being monitored and Timeout performed Patient Re-evaluated:Patient Re-evaluated prior to induction Oxygen Delivery Method: Circle system utilized Preoxygenation: Pre-oxygenation with 100% oxygen Induction Type: IV induction Ventilation: Mask ventilation without difficulty Laryngoscope Size: Mac and 4 Grade View: Grade I Tube type: Oral Tube size: 7.5 mm Number of attempts: 2 Airway Equipment and Method: Stylet Placement Confirmation: ETT inserted through vocal cords under direct vision,  positive ETCO2 and breath sounds checked- equal and bilateral Secured at: 21 cm Tube secured with: Tape Dental Injury: Teeth and Oropharynx as per pre-operative assessment

## 2017-03-07 NOTE — Progress Notes (Signed)
Carla Mills had her flexible sigmoidoscopy yesterday.  Unfortunately, the stricture was very small.  From what she says, the stricture was quite friable.  There is some concern that this might be malignant.  It sounds like she will be going to surgery today to resect out this stricture to allow for stool to pass through her.  Otherwise, she has a lot of abdominal bloating.  She is had nausea.  She has had no vomiting.  She has had no fever.  There is been no bleeding.  She has had no cough or shortness of breath.  Her labs look okay.  Her white cell count is 7.6.  Hemoglobin 13.8.  Platelet count 268,000.  Her potassium is 4.2.  Creatinine is 0.62.  She has been out of bed.  She has had no leg swelling.  On her physical exam, her vital signs all look good.  Her blood pressure 122/95.  Her abdomen is slightly distended.  Bowel sounds are hyperactive.  There is no fluid wave.  There is some slight tenderness below the umbilicus to palpation.  No masses noted.  Her lungs are clear bilaterally.  Cardiac exam regular rate and rhythm.  Ms. Revolorio has a colonic stricture.  The question is whether this is malignant.  I think the way that we will find this out now is to have this area resected.  Hopefully she will have this done today.  I would be surprised that this is a malignant stricture.  I suppose that could happen.  Her CEA has been normal.  I cannot think of anything that I need to do right now.  I think it is now a matter of surgery performing a resection.  Her faith remains quite strong.  Lattie Haw, MD  Psalms [phone]

## 2017-03-07 NOTE — Consult Note (Signed)
Carla Mills Nurse requested for preoperative stoma site marking  Discussed surgical procedure and stoma creation with patient and family. Carla Mills has had an ostomy in the past and is very familiar with the care of an ostomy.  She is active and understands this most likely will be a permanent stoma Answered patient and family questions.   Examined patient lying, sitting in order to place the marking in the patient's visual field, away from any creases or abdominal contour issues and within the rectus muscle.  Discussed with patient, her previous stoma site is scarred and has creasing from scarring.  She and I discussed site and she would like the stoma to slightly higher this time to prevent the ostomy pouch from hanging so low into the edge of her belt line/pants.  She has used mini pouches before also.  We briefly discussed ostomy irrigation and she is interested in this once she has fully recuperated.  Marked for colostomy in the LLQ  __4.5__ cm to the left of the umbilicus   Patient's abdomen cleansed with CHG wipes at site markings, allowed to air dry prior to marking.Covered mark with thin film transparent dressing.  Provided patient with my contact information should she have any questions.   [place] Nurse team will follow up with patient after surgery for continue ostomy care and teaching.  [place] MSN, [place], [place], [place]

## 2017-03-07 NOTE — Progress Notes (Signed)
[facility] PROGRESS NOTE  Aniylah Mills TIR:[id number] DOB: [birth year] DOA: 03/03/2017  PCP: Patient, No Pcp Per  Brief History/Interval Summary: 46 year old Caucasian female with a past medical history of colon cancer status post bowel resection and chemotherapy presented with complains of worsening abdominal pain.  Evaluation revealed a possible stricture at the anastomotic site of the colon.  Patient was hospitalized for further management.  Patient underwent flexible sigmoidoscopy which revealed stricture with concern for recurrence of her cancer.  Patient to be taken to the operating room for resection today.  Reason for Visit: Colonic stricture  Consultants: Gastroenterology.  General surgery.  Oncology  Procedures:  Sigmoidoscopy on 12/13.  Antibiotics: None  Subjective/Interval History: Patient continues to feel miserable.  Continues to have abdominal discomfort.  Some nausea.  Occasional episodes of vomiting.    ROS: Denies any chest pain or shortness of breath.  Objective:  Vital Signs  Vitals:   03/06/17 1550 03/06/17 1801 03/06/17 2016 03/07/17 0442  BP: 116/74 (!) 136/106 120/85 (!) 122/95  Pulse: 64 65 67 69  Resp: (!) 21 20 18 16   Temp:  98.6 F (37 C) 98.4 F (36.9 C) 98.2 F (36.8 C)  TempSrc:  Oral Oral Oral  SpO2: 99% 98% 97% 96%  Weight:      Height:       No intake or output data in the 24 hours ending 03/07/17 1012 Filed Weights   03/03/17 1352 03/03/17 2216 03/06/17 1353  Weight: 71.7 kg (158 lb) 69.6 kg (153 lb 6.4 oz) 70.3 kg (155 lb)    General appearance: Awake alert.  In no distress. Resp: Clear to auscultation bilaterally Cardio: S1-S2 is normal and regular.  No S3-S4.  No rubs murmurs GI: Abdomen remains soft.  Mildly tender in the left lower quadrant.  No masses organomegaly Extremities: No edema Neurologic: No focal deficits.  Lab Results:  Data Reviewed: I have personally reviewed following labs and imaging  studies  CBC: Recent Labs  Lab 03/03/17 1412 03/04/17 0500 03/06/17 0458 03/07/17 0420  WBC 9.1 7.7 7.9 7.6  NEUTROABS 6.8  --   --   --   HGB 13.5 13.3 13.4 13.8  HCT 39.1 39.5 39.2 40.2  MCV 88.1 89.2 88.3 88.0  PLT 264 256 273 676    Basic Metabolic Panel: Recent Labs  Lab 03/03/17 1412 03/04/17 0500 03/05/17 0521 03/06/17 1646 03/07/17 0420  NA 139 142 140 136 134*  K 3.5 4.0 3.7 3.9 4.2  CL 107 108 107 101 98*  CO2 25 25 24 29 28   GLUCOSE 92 87 84 97 116*  BUN 13 13 10 9 8   CREATININE 0.61 0.69 0.63 0.57 0.62  CALCIUM 9.4 9.3 8.9 9.0 8.9  MG  --   --  1.8  --   --     GFR: Estimated Creatinine Clearance: 82.7 mL/min (by C-G formula based on SCr of 0.62 mg/dL).  Liver Function Tests: Recent Labs  Lab 03/03/17 1412 03/06/17 1646  AST 21 19  ALT 18 14  ALKPHOS 73 63  BILITOT 0.8 1.2  PROT 6.9 6.7  ALBUMIN 4.3 3.9     Radiology Studies: Dg Abd Acute W/chest  Result Date: 03/05/2017 CLINICAL DATA:  Abdominal pain EXAM: DG ABDOMEN ACUTE W/ 1V CHEST COMPARISON:  03/03/2017 FINDINGS: Cardiac shadow is within normal limits. Right-sided chest wall port is noted in satisfactory position. The lungs are well-aerated without focal infiltrate or sizable effusion. Scattered large and small bowel gas is noted.  Air-fluid levels are noted within the colon new from the prior exam with an overall decrease in the amount of fecal material within the colon. There remains considerable fecal material within the right colon. No free air is seen. Surgical changes in the pelvis are again noted. No bony abnormality is seen. IMPRESSION: Overall decrease in the amount of fecal material within the colon. This is particularly noted in the transverse colon and descending colon. No acute intrathoracic abnormality is noted. Electronically Signed   By: Inez Catalina M.D.   On: 03/05/2017 10:33     Medications:  Scheduled: . feeding supplement  1 Container Oral TID BM  . fluticasone  1  spray Each Nare BID   Continuous: . cefoTEtan (CEFOTAN) IV    . 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  Patient seen by gastroenterology and general surgery.  Patient received enemas and laxatives.  She had a today's concern for malignant stricture.  Biopsies were taken.  This apparently shows invasive adenocarcinoma.  General surgery planning to take the patient to OR today for resection.  Continue IV fluids.    History of colon cancer status post bowel resection status post chemotherapy Oncology has been following.   DVT Prophylaxis: SCDs Code Status: Full Code Family Communication: Discussed with the patient Disposition Plan: Management as outlined above    LOS: 3 days   [place] [facility] Pager [phone] 03/07/2017, 10:12 AM  If 7PM-7AM, please contact night-coverage at [email or web address], password [facility]

## 2017-03-07 NOTE — Anesthesia Preprocedure Evaluation (Signed)
Anesthesia Evaluation  Patient identified by MRN, date of birth, ID band Patient awake    Reviewed: Allergy & Precautions, NPO status , Patient's Chart, lab work & pertinent test results  History of Anesthesia Complications (+) PONV and history of anesthetic complications  Airway Mallampati: II  TM Distance: >3 FB Neck ROM: Full    Dental no notable dental hx. (+) Dental Advisory Given   Pulmonary neg pulmonary ROS,    Pulmonary exam normal breath sounds clear to auscultation       Cardiovascular negative cardio ROS Normal cardiovascular exam Rhythm:Regular Rate:Normal     Neuro/Psych negative neurological ROS  negative psych ROS   GI/Hepatic Neg liver ROS, Colon ca   Endo/Other  negative endocrine ROS  Renal/GU negative Renal ROS  negative genitourinary   Musculoskeletal negative musculoskeletal ROS (+)   Abdominal   Peds negative pediatric ROS (+)  Hematology negative hematology ROS (+)   Anesthesia Other Findings   Reproductive/Obstetrics negative OB ROS                             Anesthesia Physical  Anesthesia Plan  ASA: II  Anesthesia Plan: General   Post-op Pain Management:    Induction: Intravenous  PONV Risk Score and Plan: 3 and Ondansetron, Dexamethasone, Treatment may vary due to age or medical condition, Scopolamine patch - Pre-op and Midazolam  Airway Management Planned: Oral ETT  Additional Equipment:   Intra-op Plan:   Post-operative Plan: Extubation in OR  Informed Consent: I have reviewed the patients History and Physical, chart, labs and discussed the procedure including the risks, benefits and alternatives for the proposed anesthesia with the patient or authorized representative who has indicated his/her understanding and acceptance.   Dental advisory given  Plan Discussed with: CRNA and Anesthesiologist  Anesthesia Plan Comments:          Anesthesia Quick Evaluation

## 2017-03-07 NOTE — Progress Notes (Signed)
1 Day Post-Op    CC:  Nausea and vomiting, abdominal pain    Subjective: Pt discussed with Dr. Hassell Done, and she does not want to go back to [facility] at this time. She is having pain and feels terrible right now.     Objective: Vital signs in last 24 hours: Temp:  [98.1 F (36.7 C)-98.6 F (37 C)] 98.2 F (36.8 C) (12/14 0442) Pulse Rate:  [64-73] 69 (12/14 0442) Resp:  [16-28] 16 (12/14 0442) BP: (105-136)/(62-106) 122/95 (12/14 0442) SpO2:  [96 %-100 %] 96 % (12/14 0442) Weight:  [70.3 kg (155 lb)] 70.3 kg (155 lb) (12/13 1353) Last BM Date: 03/06/17 I/O not done Afebrile, VSS Labs show Na down some, CBC is stable Intake/Output from previous day: No intake/output data recorded. Intake/Output this shift: No intake/output data recorded.  General appearance: alert, cooperative and no distress Resp: clear to auscultation bilaterally GI: soft, still mildly distended, complains of pain lower abdomen that just persist.   Lab Results:  Recent Labs    03/06/17 0458 03/07/17 0420  WBC 7.9 7.6  HGB 13.4 13.8  HCT 39.2 40.2  PLT 273 268    BMET Recent Labs    03/06/17 1646 03/07/17 0420  NA 136 134*  K 3.9 4.2  CL 101 98*  CO2 29 28  GLUCOSE 97 116*  BUN 9 8  CREATININE 0.57 0.62  CALCIUM 9.0 8.9   PT/INR No results for input(s): LABPROT, INR in the last 72 hours.  Recent Labs  Lab 03/03/17 1412 03/06/17 1646  AST 21 19  ALT 18 14  ALKPHOS 73 63  BILITOT 0.8 1.2  PROT 6.9 6.7  ALBUMIN 4.3 3.9     Lipase     Component Value Date/Time   LIPASE 41 05/24/2016 1648     Medications: . feeding supplement  1 Container Oral TID BM  . fluticasone  1 spray Each Nare BID    Assessment/Plan PSBO, suspect 2/2 anastomotic stricture  Stage II colon CA 02/2015, recurrent stage IV colon cancer 02/2016 s/p chemotherapy Flexible sigmoidoscopy 03/06/17:  Severe stenosis 13-15 cm from the anal verge, could not be traversed, BX pending - S/p/LAR w/ hartman's  pouch/colostomy/appendectomy 02/20/15 - S/pLOA/colostomy reversal 11/15/15 by Dr. Rosendo Gros - S/p Hyperthermic intraperitoneal chemotherapy with bilateral salpingoophrectomy, omentectomy, lysis of adhesions 08/08/16 by Dr. Crisoforo [place], [facility] - last dose 10/2016. Followed by Dr. Marin Olp.  - tolerating CLD  - GI consulted: performing bowel prep and planning for possible flex sig this hospitalization to r/o recurrent malignancy and possibly perform dilation of suspected stricture.Consider PO mag citrate/miralax if enemas fail. - No acute surgical needs. General surgery will follow and make further recommendations should above proposed treatment by GI fail.Will notify Dr. Rosendo Gros of patient admission and abnormal CT.  FEN: Clear liquids/IV fluids ID: None DVT: SCD Foley: None Follow up: TBD   Plan:  Dr. Hassell Done has offered to take her to the OR today, Exploratory lapartomy, bowel resection of the stenosis, and colostomy.  She is agreeable to this.   Type and screen, PTT/INR, EKG ordered.  She had hives with penicillin at age 1.       LOS: 3 days    Jariel Drost 03/07/2017 [phone]

## 2017-03-08 LAB — BASIC METABOLIC PANEL
ANION GAP: 9 (ref 5–15)
BUN: 10 mg/dL (ref 6–20)
CALCIUM: 8.6 mg/dL — AB (ref 8.9–10.3)
CO2: 29 mmol/L (ref 22–32)
Chloride: 99 mmol/L — ABNORMAL LOW (ref 101–111)
Creatinine, Ser: 0.75 mg/dL (ref 0.44–1.00)
GFR calc Af Amer: >60 mL/min (ref >60)
Glucose, Bld: 131 mg/dL — ABNORMAL HIGH (ref 65–99)
POTASSIUM: 4 mmol/L (ref 3.5–5.1)
SODIUM: 137 mmol/L (ref 135–145)

## 2017-03-08 LAB — CBC
HEMATOCRIT: 41.9 % (ref 36.0–46.0)
HEMOGLOBIN: 14.3 g/dL (ref 12.0–15.0)
MCH: 30.1 pg (ref 26.0–34.0)
MCHC: 34.1 g/dL (ref 30.0–36.0)
MCV: 88.2 fL (ref 78.0–100.0)
Platelets: 330 10*3/uL (ref 150–400)
RBC: 4.75 MIL/uL (ref 3.87–5.11)
RDW: 12.2 % (ref 11.5–15.5)
WBC: 10.9 10*3/uL — AB (ref 4.0–10.5)

## 2017-03-08 MED ORDER — OXYCODONE HCL 5 MG PO TABS
5.0000 mg | ORAL_TABLET | ORAL | Status: DC | PRN
  Administered 2017-03-08 – 2017-03-10 (×3): 5 mg via ORAL
  Filled 2017-03-08 (×3): qty 1

## 2017-03-08 MED ORDER — MORPHINE SULFATE (PF) 2 MG/ML IV SOLN
2.0000 mg | INTRAVENOUS | Status: DC | PRN
  Administered 2017-03-08: 2 mg via INTRAVENOUS
  Filled 2017-03-08: qty 1

## 2017-03-08 NOTE — Progress Notes (Signed)
Patient ID: Carla Mills, female   DOB: 1970-12-07, 46 y.o.   MRN: [id number]     Progress Note   Subjective   Husband in room - pt up taking a shower!Marland Kitchen. To stary clears today Operative findings reviewed   Objective   Vital signs in last 24 hours: Temp:  [98 F (36.7 C)-98.7 F (37.1 C)] 98 F (36.7 C) (12/15 0449) Pulse Rate:  [62-80] 73 (12/15 0449) Resp:  [14-24] 20 (12/15 0449) BP: (114-146)/(74-92) 114/80 (12/15 0449) SpO2:  [99 %-100 %] 100 % (12/15 0449) Weight:  [155 lb (70.3 kg)] 155 lb (70.3 kg) (12/14 1332) Last BM Date: 03/06/17 General:    Not examined  Intake/Output from previous day: 12/14 0701 - 12/15 0700 In: 2400 [I.V.:2250; IV Piggyback:150] Out: 1875 [Urine:1800; Stool:25; Blood:50] Intake/Output this shift: No intake/output data recorded.  Lab Results: Recent Labs    03/06/17 0458 03/07/17 0420 03/08/17 0500  WBC 7.9 7.6 10.9*  HGB 13.4 13.8 14.3  HCT 39.2 40.2 41.9  PLT 273 268 330   BMET Recent Labs    03/06/17 1646 03/07/17 0420 03/08/17 0500  NA 136 134* 137  K 3.9 4.2 4.0  CL 101 98* 99*  CO2 29 28 29   GLUCOSE 97 116* 131*  BUN 9 8 10   CREATININE 0.57 0.62 0.75  CALCIUM 9.0 8.9 8.6*   LFT Recent Labs    03/06/17 1646  PROT 6.7  ALBUMIN 3.9  AST 19  ALT 14  ALKPHOS 63  BILITOT 1.2   PT/INR Recent Labs    03/07/17 0905  LABPROT 13.5  INR 1.04        Assessment / Plan:     #1 46 yo  Female with hx of metastatic Colon Ca - admitted with severe obstipation  X one month and found on CT  to have stricture at prior surgical anastomosis with no evidence of recurrence  Of carcinomatosis or other disease in abdomen  Unfortunately stricture obstructing and malignant appearing on Flex 12/13 She is stable s/p laparotomy, creation of colostomy  yesterday with finding of a firm shelf where the stricture is growing anteriorly adjacent to vagina , not resectable.  She is doing well post op , up and out of bed, advancing  diet  Oncology involved . 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

## 2017-03-08 NOTE — Progress Notes (Addendum)
1 Day Post-Op   Subjective/Chief Complaint: No complaints. Wants food   Objective: Vital signs in last 24 hours: Temp:  [98 F (36.7 C)-98.7 F (37.1 C)] 98 F (36.7 C) (12/15 0449) Pulse Rate:  [62-80] 73 (12/15 0449) Resp:  [14-24] 20 (12/15 0449) BP: (114-146)/(74-92) 114/80 (12/15 0449) SpO2:  [99 %-100 %] 100 % (12/15 0449) Weight:  [70.3 kg (155 lb)] 70.3 kg (155 lb) (12/14 1332) Last BM Date: 03/06/17  Intake/Output from previous day: 12/14 0701 - 12/15 0700 In: 2400 [I.V.:2250; IV Piggyback:150] Out: 1875 [Urine:1800; Stool:25; Blood:50] Intake/Output this shift: No intake/output data recorded.  General appearance: alert and cooperative Resp: clear to auscultation bilaterally Cardio: regular rate and rhythm GI: soft, mild tenderness. ostomy pink and productive. incision ok  Lab Results:  Recent Labs    03/07/17 0420 03/08/17 0500  WBC 7.6 10.9*  HGB 13.8 14.3  HCT 40.2 41.9  PLT 268 330   BMET Recent Labs    03/07/17 0420 03/08/17 0500  NA 134* 137  K 4.2 4.0  CL 98* 99*  CO2 28 29  GLUCOSE 116* 131*  BUN 8 10  CREATININE 0.62 0.75  CALCIUM 8.9 8.6*   PT/INR Recent Labs    03/07/17 0905  LABPROT 13.5  INR 1.04   ABG No results for input(s): PHART, HCO3 in the last 72 hours.  Invalid input(s): PCO2, PO2  Studies/Results: No results found.  Anti-infectives: Anti-infectives (From admission, onward)   Start     Dose/Rate Route Frequency Ordered Stop   03/07/17 2200  cefoTEtan (CEFOTAN) 2 g in dextrose 5 % 50 mL IVPB     2 g 100 mL/hr over 30 Minutes Intravenous Every 12 hours 03/07/17 2006 03/08/17 0418   03/07/17 1237  cefoTEtan in Dextrose 5% (CEFOTAN) 2-2.08 GM-%(50ML) IVPB  Status:  Discontinued    Comments:  Waldron Session   : cabinet override      03/07/17 1237 03/07/17 1344   03/07/17 1200  cefoTEtan (CEFOTAN) 2 g in dextrose 5 % 50 mL IVPB     2 g 100 mL/hr over 30 Minutes Intravenous On call to O.R. 03/07/17 0836 03/07/17  1425      Assessment/Plan: s/p Procedure(s): EXPLORATORY LAPAROTOMY AND CREATION OF A COLOSTOMY AND MUCOUS FISTULA (N/A) Advance diet. Start clears today and advance OOB Recurrent colon cancer per oncology POD 1  LOS: 4 days    TOTH III,PAUL S 03/08/2017

## 2017-03-08 NOTE — Progress Notes (Signed)
I am absolutely distraught over the findings at surgery.  She had surgery yesterday.  She was found to have fairly extensive recurrence in the pelvis.  This stricture was biopsied and found to be malignant.  At the time of surgery, she had a "shelf" of tumor that was close to invading her female parts.  She has a colostomy now.  She actually feels a whole lot better.  I am just very surprised by this.  Nothing really showed up on her CT scan.  Nothing was elevated with her CEA level.  As far as any therapy, I would suspect that we might consider radiation.  She  has not yet had radiation therapy.  When she gets discharged, we will get a PET scan.  We will get an MRI of her abdomen/pelvis.  Maybe this will give [place] better detail as to the recurrence.  Her labs look pretty good.  She is not anemic.  I have spoken to her surgeon at [facility]-Dr. Jyl Heinz.  He is aware of the recurrence.  She looks good this morning.  All of her vital signs look stable.  Her abdomen is not distended.  Bowel sounds are minimal.  Her colostomy is intact.  I very much appreciate the incredible help from our surgical team.  Dr. Hassell Done, as always, did a fantastic job.  I know the staff on [address] will continue to help her and get her more ambulatory.  Hopefully, she will be able to go home soon.  Lattie Haw, MD  [place] 4:23

## 2017-03-08 NOTE — Progress Notes (Signed)
Wound vac in place to midline surgical wound some red blood in line but none in chamber.

## 2017-03-08 NOTE — Progress Notes (Signed)
[facility] PROGRESS NOTE  Carla Mills XNA:[id number] DOB: [birth year] DOA: 03/03/2017  PCP: Patient, No Pcp Per  Brief History/Interval Summary: 46 year old Caucasian female with a past medical history of colon cancer status post bowel resection and chemotherapy presented with complains of worsening abdominal pain.  Evaluation revealed a possible stricture at the anastomotic site of the colon.  Patient was hospitalized for further management.  Patient underwent flexible sigmoidoscopy which revealed stricture with concern for recurrence of her cancer.  Patient underwent surgery on 12/14  Reason for Visit: Malignant stricture involving colon  Consultants: Gastroenterology.  General surgery.  Oncology  Procedures:  Sigmoidoscopy on 12/13.  On 12/14: Laparotomy, creation of a mucous fistula in the inferior aspect of the incision with end colostomy  Antibiotics: None  Subjective/Interval History: Patient states that she is feeling better from a symptom standpoint.  Abdominal pain has improved significantly.  Has not had any further episodes of nausea vomiting.  Has been passing a lot of gas in her ostomy.    ROS: Denies any chest pain or shortness of breath  Objective:  Vital Signs  Vitals:   03/07/17 1722 03/07/17 2022 03/08/17 0000 03/08/17 0449  BP: 121/74 125/84 118/88 114/80  Pulse: 62 76 72 73  Resp:  18 16 20   Temp: 98.7 F (37.1 C) 98.7 F (37.1 C)  98 F (36.7 C)  TempSrc:  Oral  Oral  SpO2: 100% 100% 100% 100%  Weight:      Height:        Intake/Output Summary (Last 24 hours) at 03/08/2017 1008 Last data filed at 03/08/2017 0450 Gross per 24 hour  Intake 2400 ml  Output 1875 ml  Net 525 ml   Filed Weights   03/03/17 2216 03/06/17 1353 03/07/17 1332  Weight: 69.6 kg (153 lb 6.4 oz) 70.3 kg (155 lb) 70.3 kg (155 lb)    General appearance: Awake alert.  No distress Resp: Clear to auscultation bilaterally Cardio: S1-S2 is normal regular no  S3-S4.  No rubs murmurs of bruit GI: Colostomy is noted.  Bowel sounds are present.  Tender to palpation.   Extremities: No edema Neurologic: No focal deficits.  Lab Results:  Data Reviewed: I have personally reviewed following labs and imaging studies  CBC: Recent Labs  Lab 03/03/17 1412 03/04/17 0500 03/06/17 0458 03/07/17 0420 03/08/17 0500  WBC 9.1 7.7 7.9 7.6 10.9*  NEUTROABS 6.8  --   --   --   --   HGB 13.5 13.3 13.4 13.8 14.3  HCT 39.1 39.5 39.2 40.2 41.9  MCV 88.1 89.2 88.3 88.0 88.2  PLT 264 256 273 268 542    Basic Metabolic Panel: Recent Labs  Lab 03/04/17 0500 03/05/17 0521 03/06/17 1646 03/07/17 0420 03/08/17 0500  NA 142 140 136 134* 137  K 4.0 3.7 3.9 4.2 4.0  CL 108 107 101 98* 99*  CO2 25 24 29 28 29   GLUCOSE 87 84 97 116* 131*  BUN 13 10 9 8 10   CREATININE 0.69 0.63 0.57 0.62 0.75  CALCIUM 9.3 8.9 9.0 8.9 8.6*  MG  --  1.8  --   --   --     GFR: Estimated Creatinine Clearance: 82.7 mL/min (by C-G formula based on SCr of 0.75 mg/dL).  Liver Function Tests: Recent Labs  Lab 03/03/17 1412 03/06/17 1646  AST 21 19  ALT 18 14  ALKPHOS 73 63  BILITOT 0.8 1.2  PROT 6.9 6.7  ALBUMIN 4.3 3.9  Radiology Studies: No results found.   Medications:  Scheduled: . heparin injection (subcutaneous)  5,000 Units Subcutaneous Q8H   Continuous: . 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  Patient seen by gastroenterology and general surgery.  Patient underwent sigmoidoscopy which raised concern for malignant stricture.  Biopsy was taken.   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  Patient underwent surgery which revealed concern for a tumor shelf.  This was unresectable.  Ostomy was created along with mucous fistula.  Symptomatically patient has improved.  General surgery is following.    History of colon cancer status post bowel resection status post chemotherapy, now with recurrence Oncology has been following.  Pathology and surgical findings suggest recurrence of cancer.  Further management as an outpatient.   DVT Prophylaxis: SCDs Code Status: Full Code Family Communication: Discussed with the patient Disposition Plan: Per Surgery    LOS: 4 days   Bonnielee Haff  [facility] Pager [phone] 03/08/2017, 10:08 AM  If 7PM-7AM, please contact night-coverage at [email or web address], password [facility]

## 2017-03-09 ENCOUNTER — Inpatient Hospital Stay (HOSPITAL_COMMUNITY): Payer: BLUE CROSS/BLUE SHIELD

## 2017-03-09 DIAGNOSIS — N390 Urinary tract infection, site not specified: Secondary | ICD-10-CM

## 2017-03-09 DIAGNOSIS — E871 Hypo-osmolality and hyponatremia: Secondary | ICD-10-CM

## 2017-03-09 DIAGNOSIS — R509 Fever, unspecified: Secondary | ICD-10-CM

## 2017-03-09 LAB — BASIC METABOLIC PANEL
ANION GAP: 9 (ref 5–15)
BUN: 10 mg/dL (ref 6–20)
CO2: 23 mmol/L (ref 22–32)
Calcium: 8.4 mg/dL — ABNORMAL LOW (ref 8.9–10.3)
Chloride: 95 mmol/L — ABNORMAL LOW (ref 101–111)
Creatinine, Ser: 0.66 mg/dL (ref 0.44–1.00)
GFR calc Af Amer: >60 mL/min (ref >60)
GLUCOSE: 125 mg/dL — AB (ref 65–99)
POTASSIUM: 3.9 mmol/L (ref 3.5–5.1)
SODIUM: 127 mmol/L — AB (ref 135–145)

## 2017-03-09 LAB — URINALYSIS, ROUTINE W REFLEX MICROSCOPIC
BILIRUBIN URINE: NEGATIVE
Glucose, UA: NEGATIVE mg/dL
Ketones, ur: NEGATIVE mg/dL
Nitrite: NEGATIVE
PROTEIN: 100 mg/dL — AB
Specific Gravity, Urine: 1.023 (ref 1.005–1.030)
pH: 5 (ref 5.0–8.0)

## 2017-03-09 LAB — CBC
HCT: 36.6 % (ref 36.0–46.0)
Hemoglobin: 12.7 g/dL (ref 12.0–15.0)
MCH: 30.5 pg (ref 26.0–34.0)
MCHC: 34.7 g/dL (ref 30.0–36.0)
MCV: 87.8 fL (ref 78.0–100.0)
PLATELETS: 244 10*3/uL (ref 150–400)
RBC: 4.17 MIL/uL (ref 3.87–5.11)
RDW: 12.6 % (ref 11.5–15.5)
WBC: 20.3 10*3/uL — AB (ref 4.0–10.5)

## 2017-03-09 MED ORDER — FAMOTIDINE 20 MG PO TABS
20.0000 mg | ORAL_TABLET | Freq: Two times a day (BID) | ORAL | Status: DC
  Administered 2017-03-09 – 2017-03-10 (×3): 20 mg via ORAL
  Filled 2017-03-09 (×4): qty 1

## 2017-03-09 MED ORDER — SODIUM CHLORIDE 0.9 % IV SOLN
INTRAVENOUS | Status: DC
  Administered 2017-03-09 – 2017-03-10 (×2): via INTRAVENOUS

## 2017-03-09 MED ORDER — DEXTROSE 5 % IV SOLN
1.0000 g | Freq: Every day | INTRAVENOUS | Status: DC
  Administered 2017-03-09 – 2017-03-11 (×3): 1 g via INTRAVENOUS
  Filled 2017-03-09 (×3): qty 10

## 2017-03-09 NOTE — Progress Notes (Signed)
2 Days Post-Op   Subjective/Chief Complaint: No complaints. Passing flatus and has some ostomy output   Objective: Vital signs in last 24 hours: Temp:  [99.7 F (37.6 C)-101.4 F (38.6 C)] 99.9 F (37.7 C) (12/16 0600) Pulse Rate:  [100-129] 112 (12/16 0600) Resp:  [16-20] 16 (12/16 0600) BP: (102-107)/(70-79) 102/79 (12/16 0600) SpO2:  [94 %-96 %] 94 % (12/16 0600) Weight:  [70.8 kg (156 lb)] 70.8 kg (156 lb) (12/16 0600) Last BM Date: 03/06/17  Intake/Output from previous day: 12/15 0701 - 12/16 0700 In: 1250 [P.O.:50; I.V.:1200] Out: 330 [Urine:300; Stool:30] Intake/Output this shift: Total I/O In: 240 [P.O.:240] Out: -   General appearance: alert and cooperative Resp: clear to auscultation bilaterally Cardio: regular rate and rhythm GI: soft, mild tenderness. good bs. ostomy pink and productive  Lab Results:  Recent Labs    03/08/17 0500 03/09/17 0500  WBC 10.9* 20.3*  HGB 14.3 12.7  HCT 41.9 36.6  PLT 330 244   BMET Recent Labs    03/08/17 0500 03/09/17 0500  NA 137 126*  K 4.0 5.0  CL 99* 94*  CO2 29 24  GLUCOSE 131* 343*  BUN 10 10  CREATININE 0.75 0.79  CALCIUM 8.6* 7.7*   PT/INR Recent Labs    03/07/17 0905  LABPROT 13.5  INR 1.04   ABG No results for input(s): PHART, HCO3 in the last 72 hours.  Invalid input(s): PCO2, PO2  Studies/Results: No results found.  Anti-infectives: Anti-infectives (From admission, onward)   Start     Dose/Rate Route Frequency Ordered Stop   03/07/17 2200  cefoTEtan (CEFOTAN) 2 g in dextrose 5 % 50 mL IVPB     2 g 100 mL/hr over 30 Minutes Intravenous Every 12 hours 03/07/17 2006 03/08/17 0418   03/07/17 1237  cefoTEtan in Dextrose 5% (CEFOTAN) 2-2.08 GM-%(50ML) IVPB  Status:  Discontinued    Comments:  Waldron Session   : cabinet override      03/07/17 1237 03/07/17 1344   03/07/17 1200  cefoTEtan (CEFOTAN) 2 g in dextrose 5 % 50 mL IVPB     2 g 100 mL/hr over 30 Minutes Intravenous On call to O.R.  03/07/17 0836 03/07/17 1425      Assessment/Plan: s/p Procedure(s): EXPLORATORY LAPAROTOMY AND CREATION OF A COLOSTOMY AND MUCOUS FISTULA (N/A) Advance diet. Start fulls today Wbc up today. Will check u/a and cxr. abd looks good so far. Recheck tomorrow OOB POD 2  LOS: 5 days    TOTH III,Kember Boch S 03/09/2017

## 2017-03-09 NOTE — Progress Notes (Signed)
[facility] PROGRESS NOTE  Shanitha Twining XIP:[id number] DOB: 1970/09/24 DOA: 03/03/2017  PCP: Patient, No Pcp Per  Brief History/Interval Summary: 46 year old Caucasian female with a past medical history of colon cancer status post bowel resection and chemotherapy presented with complains of worsening abdominal pain.  Evaluation revealed a possible stricture at the anastomotic site of the colon.  Patient was hospitalized for further management.  Patient underwent flexible sigmoidoscopy which revealed stricture with concern for recurrence of her cancer.  Patient underwent surgery on 12/14.  Reason for Visit: Malignant stricture involving colon  Consultants: Gastroenterology.  General surgery.  Oncology  Procedures:  Sigmoidoscopy on 12/13.  On 12/14: Laparotomy, creation of a mucous fistula in the inferior aspect of the incision with end colostomy  Antibiotics: None  Subjective/Interval History: Patient states that she is feeling better.  She did have fever overnight.  However she denies any worsening abdominal pain.  No nausea vomiting.  Does admit to some acid reflux at times.  Passing gas through her ostomy  ROS: Denies any chest pain or shortness of breath.  Denies any cough.  No dysuria.  Objective:  Vital Signs  Vitals:   03/08/17 1235 03/08/17 2148 03/08/17 2230 03/09/17 0600  BP: 107/70 105/71  102/79  Pulse: 100 (!) 129  (!) 112  Resp: 20 20  16   Temp: 99.7 F (37.6 C) (!) 101.4 F (38.6 C) (!) 100.4 F (38 C) 99.9 F (37.7 C)  TempSrc: Oral Oral  Oral  SpO2: 95% 96%  94%  Weight:    70.8 kg (156 lb)  Height:        Intake/Output Summary (Last 24 hours) at 03/09/2017 0951 Last data filed at 03/08/2017 1700 Gross per 24 hour  Intake 1250 ml  Output 330 ml  Net 920 ml   Filed Weights   03/06/17 1353 03/07/17 1332 03/09/17 0600  Weight: 70.3 kg (155 lb) 70.3 kg (155 lb) 70.8 kg (156 lb)    General appearance: Awake alert.  In no distress Resp:  To auscultation bilaterally.  No wheezing rales or rhonchi Cardio: S1-S2 is normal regular.  No S3-S4.  No rubs murmurs or bruit GI: Ostomy noted with brown stool.  Bowel sounds present.  Tender to palpation. Extremities: No edema Neurologic: No focal deficits.  Lab Results:  Data Reviewed: I have personally reviewed following labs and imaging studies  CBC: Recent Labs  Lab 03/03/17 1412 03/04/17 0500 03/06/17 0458 03/07/17 0420 03/08/17 0500 03/09/17 0500  WBC 9.1 7.7 7.9 7.6 10.9* 20.3*  NEUTROABS 6.8  --   --   --   --   --   HGB 13.5 13.3 13.4 13.8 14.3 12.7  HCT 39.1 39.5 39.2 40.2 41.9 36.6  MCV 88.1 89.2 88.3 88.0 88.2 87.8  PLT 264 256 273 268 330 673    Basic Metabolic Panel: Recent Labs  Lab 03/05/17 0521 03/06/17 1646 03/07/17 0420 03/08/17 0500 03/09/17 0500  NA 140 136 134* 137 126*  K 3.7 3.9 4.2 4.0 5.0  CL 107 101 98* 99* 94*  CO2 24 29 28 29 24   GLUCOSE 84 97 116* 131* 343*  BUN 10 9 8 10 10   CREATININE 4.19 0.57 0.62 0.75 0.79  CALCIUM 8.9 9.0 8.9 8.6* 7.7*  MG 1.8  --   --   --   --     GFR: Estimated Creatinine Clearance: 83 mL/min (by C-G formula based on SCr of 0.79 mg/dL).  Liver Function Tests: Recent Labs  Lab  03/03/17 1412 03/06/17 1646  AST 21 19  ALT 18 14  ALKPHOS 73 63  BILITOT 0.8 1.2  PROT 6.9 6.7  ALBUMIN 4.3 3.9     Radiology Studies: No results found.   Medications:  Scheduled: . heparin injection (subcutaneous)  5,000 Units Subcutaneous Q8H   Continuous: . 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  Patient seen by gastroenterology and general surgery.  Patient underwent sigmoidoscopy which raised concern for malignant stricture.  Biopsy was taken.  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  Patient underwent surgery which revealed concern for a tumor shelf.  This was unresectable.  Ostomy was created along with mucous fistula.  Symptomatically patient has improved.  General surgery is following.    Fever Patient developed fever overnight.  Could be due to surgery.  No obvious foci of infection.  We will order blood cultures.  Leukocytosis is noted.  UA shows many bacteria, numerous WBC, moderate leukocytes.  We will initiate ceftriaxone.  Allergy to penicillin is noted however patient did tolerate Cefotan a few days ago.  Send urine for culture.  Patient denies any respiratory symptoms.   Hyponatremia Significant drop in sodium level noted.  Glucose also noted to be elevated.  Could have been spuriously resolved as patient is getting D5 infusion.  Repeat bmet was ordered but also shows a sodium of 127.  We will change her IV fluids.  Repeat labs tomorrow.  History of colon cancer status post bowel resection status post chemotherapy, now with recurrence Oncology has been following.  Pathology and surgical findings suggest recurrence of cancer.  Further management as an outpatient.   DVT Prophylaxis: SCDs Code Status: Full Code Family Communication: Discussed with the patient Disposition Plan: Per Surgery    LOS: 5 days   Bonnielee Haff  [facility] Pager [phone] 03/09/2017, 9:51 AM  If 7PM-7AM, please contact night-coverage at [email or web address], password [facility]

## 2017-03-10 ENCOUNTER — Encounter (HOSPITAL_COMMUNITY): Payer: Self-pay | Admitting: Surgery

## 2017-03-10 DIAGNOSIS — E876 Hypokalemia: Secondary | ICD-10-CM

## 2017-03-10 LAB — CBC WITH DIFFERENTIAL/PLATELET
BASOS ABS: 0 10*3/uL (ref 0.0–0.1)
BASOS PCT: 0 %
EOS ABS: 0 10*3/uL (ref 0.0–0.7)
Eosinophils Relative: 0 %
HCT: 29.3 % — ABNORMAL LOW (ref 36.0–46.0)
HEMOGLOBIN: 10.2 g/dL — AB (ref 12.0–15.0)
Lymphocytes Relative: 7 %
Lymphs Abs: 1 10*3/uL (ref 0.7–4.0)
MCH: 30.4 pg (ref 26.0–34.0)
MCHC: 34.8 g/dL (ref 30.0–36.0)
MCV: 87.2 fL (ref 78.0–100.0)
MONO ABS: 0.9 10*3/uL (ref 0.1–1.0)
MONOS PCT: 6 %
NEUTROS PCT: 87 %
Neutro Abs: 13.6 10*3/uL — ABNORMAL HIGH (ref 1.7–7.7)
Platelets: 205 10*3/uL (ref 150–400)
RBC: 3.36 MIL/uL — ABNORMAL LOW (ref 3.87–5.11)
RDW: 12.6 % (ref 11.5–15.5)
WBC: 15.6 10*3/uL — ABNORMAL HIGH (ref 4.0–10.5)

## 2017-03-10 LAB — BASIC METABOLIC PANEL
ANION GAP: 8 (ref 5–15)
Anion gap: 8 (ref 5–15)
BUN: 10 mg/dL (ref 6–20)
BUN: 8 mg/dL (ref 6–20)
CALCIUM: 8.2 mg/dL — AB (ref 8.9–10.3)
CO2: 24 mmol/L (ref 22–32)
CO2: 25 mmol/L (ref 22–32)
CREATININE: 0.62 mg/dL (ref 0.44–1.00)
Calcium: 7.7 mg/dL — ABNORMAL LOW (ref 8.9–10.3)
Chloride: 94 mmol/L — ABNORMAL LOW (ref 101–111)
Chloride: 101 mmol/L (ref 101–111)
Creatinine, Ser: 0.79 mg/dL (ref 0.44–1.00)
GFR calc Af Amer: >60 mL/min (ref >60)
GLUCOSE: 343 mg/dL — AB (ref 65–99)
Glucose, Bld: 85 mg/dL (ref 65–99)
POTASSIUM: 5 mmol/L (ref 3.5–5.1)
Potassium: 3.4 mmol/L — ABNORMAL LOW (ref 3.5–5.1)
Sodium: 126 mmol/L — ABNORMAL LOW (ref 135–145)
Sodium: 134 mmol/L — ABNORMAL LOW (ref 135–145)

## 2017-03-10 LAB — URINE CULTURE

## 2017-03-10 LAB — MAGNESIUM: Magnesium: 1.9 mg/dL (ref 1.7–2.4)

## 2017-03-10 MED ORDER — POTASSIUM CHLORIDE CRYS ER 20 MEQ PO TBCR
40.0000 meq | EXTENDED_RELEASE_TABLET | Freq: Once | ORAL | Status: AC
  Administered 2017-03-10: 40 meq via ORAL
  Filled 2017-03-10: qty 2

## 2017-03-10 MED ORDER — ACETAMINOPHEN 325 MG PO TABS: 650.0000 mg | ORAL_TABLET | Freq: Once | ORAL | Status: DC

## 2017-03-10 MED ORDER — ACETAMINOPHEN 325 MG PO TABS: 650.0000 mg | ORAL_TABLET | Freq: Four times a day (QID) | ORAL | Status: DC | PRN

## 2017-03-10 NOTE — Progress Notes (Signed)
[facility] PROGRESS NOTE  Iliza Blankenbeckler JJH:[id number] DOB: Jul 13, 1970 DOA: 03/03/2017  PCP: Patient, No Pcp Per  Brief History/Interval Summary: 46 year old Caucasian female with a past medical history of colon cancer status post bowel resection and chemotherapy presented with complains of worsening abdominal pain.  Evaluation revealed a possible stricture at the anastomotic site of the colon.  Patient was hospitalized for further management.  Patient underwent flexible sigmoidoscopy which revealed stricture with concern for recurrence of her cancer.  Patient underwent surgery on 12/14.  Subsequently noted to have a fever.  Urine positive for UTI.  Started on ceftriaxone.  Reason for Visit: Malignant stricture involving colon  Consultants: Gastroenterology.  General surgery.  Oncology  Procedures:  Sigmoidoscopy on 12/13.  On 12/14: Laparotomy, creation of a mucous fistula in the inferior aspect of the incision with end colostomy  Antibiotics: None  Subjective/Interval History: Patient in good spirits this morning.  Feels better.  Tolerating her full liquids.  Denies any nausea vomiting.  Has had output from her ostomy.  Abdominal pain is well controlled.  Denies any dysuria.  Does report history of frequent UTIs.    ROS: Denies any chest pain or shortness of breath..  Objective:  Vital Signs  Vitals:   03/09/17 1800 03/09/17 1940 03/10/17 0226 03/10/17 0458  BP: 117/72 110/72 105/69 118/67  Pulse: (!) 110 (!) 107 (!) 102 98  Resp: 15 15 16 15   Temp: 99.8 F (37.7 C) (!) 100.7 F (38.2 C) 99.7 F (37.6 C) (!) 100.7 F (38.2 C)  TempSrc: Oral Oral Oral Oral  SpO2: 98% 98% 97% 98%  Weight:      Height:        Intake/Output Summary (Last 24 hours) at 03/10/2017 0938 Last data filed at 03/10/2017 0600 Gross per 24 hour  Intake 1360 ml  Output 1200 ml  Net 160 ml   Filed Weights   03/06/17 1353 03/07/17 1332 03/09/17 0600  Weight: 70.3 kg (155 lb) 70.3 kg  (155 lb) 70.8 kg (156 lb)    General appearance: Awake alert.  In no distress Resp: Clear to auscultation bilaterally Cardio: S1-S2 is normal regular GI: Bowel sounds present. Ostomy noted. Extremities: No edema Neurologic: No focal deficits.  Lab Results:  Data Reviewed: I have personally reviewed following labs and imaging studies  CBC: Recent Labs  Lab 03/03/17 1412  03/06/17 0458 03/07/17 0420 03/08/17 0500 03/09/17 0500 03/10/17 0530  WBC 9.1   < > 7.9 7.6 10.9* 20.3* 15.6*  NEUTROABS 6.8  --   --   --   --   --  13.6*  HGB 13.5   < > 13.4 13.8 14.3 12.7 10.2*  HCT 39.1   < > 39.2 40.2 41.9 36.6 29.3*  MCV 88.1   < > 88.3 88.0 88.2 87.8 87.2  PLT 264   < > 273 268 330 244 205   < > = values in this interval not displayed.    Basic Metabolic Panel: Recent Labs  Lab 03/05/17 0521  03/07/17 0420 03/08/17 0500 03/09/17 0500 03/09/17 1041 03/10/17 0530  NA 140   < > 134* 137 126* 127* 134*  K 3.7   < > 4.2 4.0 5.0 3.9 3.4*  CL 107   < > 98* 99* 94* 95* 101  CO2 24   < > 28 29 24 23 25   GLUCOSE 84   < > 116* 131* 343* 125* 85  BUN 10   < > 8 10 10  10  8  CREATININE 0.63   < > 0.62 0.75 0.79 0.66 0.62  CALCIUM 8.9   < > 8.9 8.6* 7.7* 8.4* 8.2*  MG 1.8  --   --   --   --   --   --    < > = values in this interval not displayed.    GFR: Estimated Creatinine Clearance: 83 mL/min (by C-G formula based on SCr of 0.62 mg/dL).  Liver Function Tests: Recent Labs  Lab 03/03/17 1412 03/06/17 1646  AST 21 19  ALT 18 14  ALKPHOS 73 63  BILITOT 0.8 1.2  PROT 6.9 6.7  ALBUMIN 4.3 3.9     Radiology Studies: Dg Chest 2 View  Result Date: 03/09/2017 CLINICAL DATA:  Elevated white blood cell count EXAM: CHEST  2 VIEW COMPARISON:  04/05/2016 FINDINGS: Cardiac shadow is within normal limits. Right chest wall port is noted in satisfactory position. The lungs are well aerated bilaterally. Small amount of free intraperitoneal air is noted under the right hemidiaphragm  consistent with the recent surgery. No focal infiltrate is seen. Small effusions are noted. IMPRESSION: Free intraperitoneal air consistent with the recent surgical history. Small pleural effusions. Electronically Signed   By: Inez Catalina M.D.   On: 03/09/2017 14:19     Medications:  Scheduled: . famotidine  20 mg Oral BID  . heparin injection (subcutaneous)  5,000 Units Subcutaneous Q8H  . potassium chloride  40 mEq Oral Once   Continuous: . sodium chloride 75 mL/hr at 03/10/17 0054  . 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  Patient seen by gastroenterology and general surgery.  Patient underwent sigmoidoscopy which raised concern for malignant stricture.  Biopsy was taken.  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  Patient underwent surgery which revealed concern for a tumor shelf.  This was unresectable.  Ostomy was created along with mucous fistula.  Symptomatically patient has improved.  General surgery is following.  Diet being advanced.  Fever likely secondary to UTI Patient developed fever overnight on 12/16.  UA was suggestive of UTI.  Patient does report history of frequent UTIs.  Urine culture was sent.  Patient started on ceftriaxone.  WBC is improved today.  Continue to monitor closely.  Follow-up on urine cultures.   Hyponatremia/hypokalemia Sodium level noted to be low yesterday.  Patient given IV fluids.  Improved this morning.  Continue to monitor.  Potassium will be repleted.  History of colon cancer status post bowel resection status post chemotherapy, now  with recurrence Oncology has been following.  Pathology and surgical findings suggest recurrence of cancer.  Further management as an outpatient.   DVT Prophylaxis: Subcutaneous heparin Code Status: Full Code Family Communication: Discussed with the patient Disposition Plan: Continue to mobilize.  Hopefully discharge in the next 1-2 days.    LOS: 6 days   [facility] Pager [phone] 03/10/2017, 9:38 AM  If 7PM-7AM, please contact night-coverage at [email or web address], password [facility]

## 2017-03-10 NOTE — Care Management Note (Signed)
Case Management Note  Patient Details  Name: Carla Mills MRN: [id number] Date of Birth: 1970/06/19  Subjective/Objective:      46 yo admitted with abdominal pain. Laparotomy with Colostomy placement on 12/14.              Action/Plan: From home with husband. Met with pt and husband at bedside to offer choice for home health services. Pt states she has used [facility] previously and would like to use them again. Pt states she has had an ostomy previously but would like a HHRN for a little while at discharge. [facility] rep alerted of referral. Will need MD order for [facility].  Expected Discharge Date:                  Expected Discharge Plan:  [place]  In-House Referral:     Discharge planning Services  CM Consult  Post Acute Care Choice:    Choice offered to:     DME Arranged:    DME Agency:     HH Arranged:  RN [place] Agency:  [place]  Status of Service:  In process, will continue to follow  If discussed at Long Length of Stay Meetings, dates discussed:    Additional CommentsLynnell Catalan, RN 03/10/2017, 10:28 AM  [phone]

## 2017-03-10 NOTE — Progress Notes (Signed)
3 Days Post-Op    CC:  SBO  Subjective: Up walking the halls with her husband.  Tolerating full liquids well and being advanced to a regular diet.  Incisional wound VAC is in place.  Ostomy is working.  Objective: Vital signs in last 24 hours: Temp:  [99.7 F (37.6 C)-100.7 F (38.2 C)] 100.7 F (38.2 C) (12/17 0458) Pulse Rate:  [98-110] 98 (12/17 0458) Resp:  [15-16] 15 (12/17 0458) BP: (105-118)/(67-72) 118/67 (12/17 0458) SpO2:  [96 %-98 %] 98 % (12/17 0458) Last BM Date: 03/10/17(loose and watery) 240 PO recorded,but taking full liquids well Voiding x 5  stool:  1200 Having low grade fevers: 100.7 - on rocephin - no source WBC trending down K+ 3.4   Intake/Output from previous day: 12/16 0701 - 12/17 0700 In: 1600 [P.O.:240; I.V.:1310; IV Piggyback:50] Out: 1200 [Stool:1200] Intake/Output this shift: Total I/O In: 120 [P.O.:120] Out: 1050 [Urine:800; Stool:250]  General appearance: alert, cooperative, no distress and up walking in the halls Resp: clear to auscultation bilaterally GI: soft, sore, Praveena in place.  + stool from the ostomy  Lab Results:  Recent Labs    03/09/17 0500 03/10/17 0530  WBC 20.3* 15.6*  HGB 12.7 10.2*  HCT 36.6 29.3*  PLT 244 205    BMET Recent Labs    03/09/17 1041 03/10/17 0530  NA 127* 134*  K 3.9 3.4*  CL 95* 101  CO2 23 25  GLUCOSE 125* 85  BUN 10 8  CREATININE 0.66 0.62  CALCIUM 8.4* 8.2*   PT/INR No results for input(s): LABPROT, INR in the last 72 hours.  Recent Labs  Lab 03/03/17 1412 03/06/17 1646  AST 21 19  ALT 18 14  ALKPHOS 73 63  BILITOT 0.8 1.2  PROT 6.9 6.7  ALBUMIN 4.3 3.9     Lipase     Component Value Date/Time   LIPASE 41 05/24/2016 1648     Medications: . acetaminophen  650 mg Oral Once  . famotidine  20 mg Oral BID  . heparin injection (subcutaneous)  5,000 Units Subcutaneous Q8H   . sodium chloride 75 mL/hr at 03/10/17 0054  . cefTRIAXone (ROCEPHIN)  IV 1 g (03/10/17  1011)   Anti-infectives (From admission, onward)   Start     Dose/Rate Route Frequency Ordered Stop   03/09/17 1230  cefTRIAXone (ROCEPHIN) 1 g in dextrose 5 % 50 mL IVPB     1 g 100 mL/hr over 30 Minutes Intravenous Daily 03/09/17 1207     03/07/17 2200  cefoTEtan (CEFOTAN) 2 g in dextrose 5 % 50 mL IVPB     2 g 100 mL/hr over 30 Minutes Intravenous Every 12 hours 03/07/17 2006 03/08/17 0418   03/07/17 1237  cefoTEtan in Dextrose 5% (CEFOTAN) 2-2.08 GM-%(50ML) IVPB  Status:  Discontinued    Comments:  [place] Session   : cabinet override      03/07/17 1237 03/07/17 1344   03/07/17 1200  cefoTEtan (CEFOTAN) 2 g in dextrose 5 % 50 mL IVPB     2 g 100 mL/hr over 30 Minutes Intravenous On call to O.R. 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 Johnathan Hausen  POD 3    Stage II colon CA 02/2015, recurrent stage IV colon cancer 02/2016 s/p chemotherapy  Flexible sigmoidoscopy 03/06/17:  Severe stenosis 13-15 cm from the anal verge, could not be traversed, BX pending - S/p/LAR w/ hartman's pouch/colostomy/appendectomy 02/20/15 - S/pLOA/colostomy reversal 11/15/15 by Dr. Rosendo Gros - S/p Hyperthermic intraperitoneal chemotherapywith bilateral salpingoophrectomy, omentectomy, lysis of adhesions5/17/18 by Dr. Crisoforo Oxford, [facility] - last dose 10/2016. Followed by Dr. Marin Olp.  - Flexible sigmoidoscopy 03/06/17 :  Obstructing mass -  INVASIVE ADENOCARCINOMA  FEN: IV fluids/ full liquids =>> regular diet ID: Cefotetan pre op; Rocephin 12/15 =>> day 3 DVT: SCD Foley: None Follow up: TBD  Plan:  Diet being advanced, we will take the incision vac off before she goes.  Advancing diet.       LOS: 6 days    Angelica Wix 03/10/2017 [phone]

## 2017-03-10 NOTE — Anesthesia Postprocedure Evaluation (Signed)
Anesthesia Post Note  Patient: Carla Mills  Procedure(s) Performed: EXPLORATORY LAPAROTOMY AND CREATION OF A COLOSTOMY AND MUCOUS FISTULA (N/A Abdomen)     Patient location during evaluation: PACU Anesthesia Type: General Level of consciousness: awake and alert Pain management: pain level controlled Vital Signs Assessment: post-procedure vital signs reviewed and stable Respiratory status: spontaneous breathing, nonlabored ventilation, respiratory function stable and patient connected to nasal cannula oxygen Cardiovascular status: blood pressure returned to baseline and stable Postop Assessment: no apparent nausea or vomiting Anesthetic complications: no    Last Vitals:  Vitals:   03/10/17 0226 03/10/17 0458  BP: 105/69 118/67  Pulse: (!) 102 98  Resp: 16 15  Temp: 37.6 C (!) 38.2 C  SpO2: 97% 98%    Last Pain:  Vitals:   03/10/17 0630  TempSrc:   PainSc: Asleep                 Tushar Enns

## 2017-03-11 DIAGNOSIS — D62 Acute posthemorrhagic anemia: Secondary | ICD-10-CM

## 2017-03-11 DIAGNOSIS — K56609 Unspecified intestinal obstruction, unspecified as to partial versus complete obstruction: Secondary | ICD-10-CM

## 2017-03-11 DIAGNOSIS — K91840 Postprocedural hemorrhage and hematoma of a digestive system organ or structure following a digestive system procedure: Secondary | ICD-10-CM

## 2017-03-11 LAB — BASIC METABOLIC PANEL
ANION GAP: 6 (ref 5–15)
BUN: <5 mg/dL — ABNORMAL LOW (ref 6–20)
CHLORIDE: 105 mmol/L (ref 101–111)
CO2: 24 mmol/L (ref 22–32)
Calcium: 7.9 mg/dL — ABNORMAL LOW (ref 8.9–10.3)
Creatinine, Ser: 0.49 mg/dL (ref 0.44–1.00)
GFR calc Af Amer: >60 mL/min (ref >60)
GLUCOSE: 97 mg/dL (ref 65–99)
POTASSIUM: 3.3 mmol/L — AB (ref 3.5–5.1)
SODIUM: 135 mmol/L (ref 135–145)

## 2017-03-11 LAB — CBC
HCT: 26.2 % — ABNORMAL LOW (ref 36.0–46.0)
Hemoglobin: 9 g/dL — ABNORMAL LOW (ref 12.0–15.0)
MCH: 29.9 pg (ref 26.0–34.0)
MCHC: 34.4 g/dL (ref 30.0–36.0)
MCV: 87 fL (ref 78.0–100.0)
PLATELETS: 238 10*3/uL (ref 150–400)
RBC: 3.01 MIL/uL — AB (ref 3.87–5.11)
RDW: 12.9 % (ref 11.5–15.5)
WBC: 11 10*3/uL — AB (ref 4.0–10.5)

## 2017-03-11 MED ORDER — HEPARIN SOD (PORK) LOCK FLUSH 100 UNIT/ML IV SOLN
500.0000 [IU] | Freq: Once | INTRAVENOUS | Status: DC
  Filled 2017-03-11: qty 5

## 2017-03-11 NOTE — Discharge Summary (Addendum)
Physician Discharge Summary  Carla Mills XBD:[id number] DOB: 1970/04/11 DOA: 03/03/2017  PCP: Patient, No Pcp Per  Admit date: 03/03/2017 Discharge date: 03/11/2017  Recommendations for Outpatient Follow-up:  1. Recurrent colon cancer 2. ABLA post op. Consider repeat CBC 1 week. 3. Patient has close relationship with Dr. Marin Olp and will arrange her own follow-up.  Follow-up Information    [facility] Follow [facility].   Specialty:  Home Health Services Contact information: [address] [place] [address] [phone]        [facility], [place] Follow up.   Specialty:  General Surgery Why:  You will see the nurse for staple removal at 2:00 PM.  Be at the office 30 minutes early for check in.   Contact information: [address] [place] [address] [phone]        [facility], [place] [facility] Follow up on 04/01/2017.   Specialty:  General Surgery Why:  You will see Dr. Hassell Done at 11:45 AM, be at the office 30 minutes early for check in.   Contact information: [address] [place] [address] [phone]            Discharge Diagnoses:  1. Recurrent colon cancer 2. 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 ABLA 4. UTI 5. Hyponatremia  Discharge Condition: improved Disposition: home  Diet recommendation: regular  Filed Weights   03/06/17 1353 03/07/17 1332 03/09/17 0600  Weight: 70.3 kg (155 lb) 70.3 kg (155 lb) 70.8 kg (156 lb)    History of present illness:  83yow PMH colon cancer presented with worsening abd pain. CT showed possible colonic stricture. Admitted for n/v, suspected colonic stricture, GI consultation.   Hospital Course:  Conservative therapy initially rec by GI. Day 4 of hospitalization GI performed flex sig which showed severe stenosis that could not be traversed. Seen by surgery and underwent resection and colostomy placement. Pathology revealed  recurrence of colon cancer. Developed post-op fever and treated for UTI. Continue to improve and cleared by surgery for discharge.  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 Hemodynamics stable. Discussed downward trend with patient and husband in detail. Discussed timing several days out from surgery. No bleeding, no pain, no dizziness. Ambulating without difficutly. Anxious to go home. Has good support. Counseled to return for bleeding, dizziness, etc.   UTI - UC unrevealing. BC NGTD. - s/p 3 doses of cefriaxone. Stop abx.  Hyponatremia - resolved.  Consultants:  GI  Oncology  General surgery  Procedures:  EGD Findings: The perianal and digital rectal examinations were normal. A severe stenosis was found in the rectum and was non-traversed, roughly  13-15cm from the anal verge. The stricture could not be traversed, could  not see the proximal side of it. The tissue within the stricture was  deeply ulcerated and friable with contact hemorrhage, and nodular,  concerning for malignancy. Biopsies were taken with a cold forceps for  histology. Area just distal to the stricture was tattooed with an  injection of Spot (carbon black). Retroflexion not performed. Distal  rectum appeared normal. Impression: - Stricture in the rectum as described above,  concerning for malignant stricture. Biopsied.  Tattooed distally.  EGD Procedure:12/14Laparotomy, creation of a mucous fistula in the inferior aspect of the incision with end colostomy  Today's assessment: S: feels good, tolerating diet, no pain. Wants to go home. Ambulating without difficulty.  No lightheadedness. No dizziness. O: Vitals:  Vitals:   03/11/17 1008 03/11/17 1401  BP: 109/71 111/69  Pulse: 89 93  Resp: 18 16  Temp:  98.8 F (37.1 C) 99.7 F (37.6 C)  SpO2: 96% 98%    Constitutional:   . Appears calm and comfortable Respiratory:  . CTA bilaterally, no w/r/r.  . Respiratory effort normal.  Cardiovascular:  . RRR, no m/r/g Abdomen:  . Soft. Staples in place vertical midline incision. Colostomy LLQ. Psychiatric:  . judgement and insight appear normal . Mental status o Mood, affect appropriate  Hgb 10.2 >> 9.0 BMP unremarkable.  Discharge Instructions  Discharge Instructions    Diet general   Complete by:  As directed    Discharge instructions   Complete by:  As directed    Call your physician or seek immediate medical attention for pain, fever, drainage, vomiting, dizziness, falls, weakness, bleeding or worsening of condition.   Increase activity slowly   Complete by:  As directed      Allergies as of 03/11/2017      Reactions   Bactrim [sulfamethoxazole-trimethoprim]    Oxaprozin Hives   REACTION: Hives   Penicillins Hives   REACTION: Hives Has patient had a PCN reaction causing immediate rash, facial/tongue/throat swelling, SOB or lightheadedness with hypotension: no Has patient had a PCN reaction causing severe rash involving mucus membranes or skin necrosis: {no Has patient had a PCN reaction that required hospitalization no Has patient had a PCN reaction occurring within the last 10 years: no If all of the above answers are "NO", then may proceed with Cephalosporin use.   Sulfonamide Derivatives Swelling   Massive extremity swelling       Medication List    STOP taking these medications   ibuprofen 200 MG tablet Commonly known as:  ADVIL,MOTRIN     TAKE these medications   acetaminophen 500 MG tablet Commonly known as:  TYLENOL Take 1,000 mg by mouth every 8 (eight) hours as needed for moderate pain or headache.   fexofenadine 180 MG tablet Commonly known as:  ALLEGRA Take 180 mg by mouth daily as needed for allergies.   fluticasone 50 MCG/ACT nasal  spray Commonly known as:  FLONASE Place 1 spray into both nostrils 2 (two) times daily.   megestrol 20 MG tablet Commonly known as:  MEGACE   traMADol 50 MG tablet Commonly known as:  ULTRAM Take 1 tablet (50 mg total) by mouth every 6 (six) hours as needed. What changed:  reasons to take this         Allergies  Allergen Reactions  . Bactrim [Sulfamethoxazole-Trimethoprim]   . Oxaprozin Hives    REACTION: Hives  . Penicillins Hives    REACTION: Hives Has patient had a PCN reaction causing immediate rash, facial/tongue/throat swelling, SOB or lightheadedness with hypotension: no Has patient had a PCN reaction causing severe rash involving mucus membranes or skin necrosis: {no Has patient had a PCN reaction that required hospitalization no Has patient had a PCN reaction occurring within the last 10 years: no If all of the above answers are "NO", then may proceed with Cephalosporin use.  . Sulfonamide Derivatives Swelling    Massive extremity swelling     The results of significant diagnostics from this hospitalization (including imaging, microbiology, ancillary and laboratory) are listed below for reference.    Significant Diagnostic Studies: Dg Chest 2 View  Result Date: 03/09/2017 CLINICAL DATA:  Elevated white blood cell count EXAM: CHEST  2 VIEW  COMPARISON:  04/05/2016 FINDINGS: Cardiac shadow is within normal limits. Right chest wall port is noted in satisfactory position. The lungs are well aerated bilaterally. Small amount of free intraperitoneal air is noted under the right hemidiaphragm consistent with the recent surgery. No focal infiltrate is seen. Small effusions are noted. IMPRESSION: Free intraperitoneal air consistent with the recent surgical history. Small pleural effusions. Electronically Signed   By: Inez Catalina M.D.   On: 03/09/2017 14:19   Ct Abdomen Pelvis W Contrast  Result Date: 03/03/2017 CLINICAL DATA:  Lower abdominal pain and distension EXAM: CT  ABDOMEN AND PELVIS WITH CONTRAST TECHNIQUE: Multidetector CT imaging of the abdomen and pelvis was performed using the standard protocol following bolus administration of intravenous contrast. CONTRAST:  166mL ISOVUE-300 IOPAMIDOL (ISOVUE-300) INJECTION 61% COMPARISON:  12/24/2016 FINDINGS: Lower chest: No acute abnormality. Hepatobiliary: Gallbladder is within normal limits. Scattered cystic lesions are noted throughout the liver. No dominant mass is seen. Pancreas: Unremarkable. No pancreatic ductal dilatation or surrounding inflammatory changes. Spleen: Normal in size without focal abnormality. Adrenals/Urinary Tract: Adrenal glands within normal limits. No renal or ureteral calculi are seen. No obstructive changes are noted. Bladder is partially distended. Stomach/Bowel: Postsurgical changes are noted in the rectosigmoid region consistent with the given clinical history. Proximal to this there is a considerable amount of retained fecal material consistent with constipation and obstruction. There appears to be a degree of narrowing at the previous surgical site. Direct visualization is recommended. No significant small bowel dilatation is noted. There is a transient intussusception within the small bowel in the left mid abdomen likely within the jejunum. The appendix has been surgically removed. Vascular/Lymphatic: No significant vascular findings are present. No enlarged abdominal or pelvic lymph nodes. Reproductive: Uterus and bilateral adnexa are unremarkable. Other: No abdominal wall hernia or abnormality. No abdominopelvic ascites. Musculoskeletal: No acute or significant osseous findings. IMPRESSION: Apparent narrowing at the previous surgical site within the rectosigmoid with proximal dilatation of the colon and retained fecal material. Direct visualization is recommended. Transient intussusception within the left mid abdomen likely within the jejunum. No obstructive changes are noted. Electronically  Signed   By: Inez Catalina M.D.   On: 03/03/2017 15:41   Dg Abd Acute W/chest  Result Date: 03/05/2017 CLINICAL DATA:  Abdominal pain EXAM: DG ABDOMEN ACUTE W/ 1V CHEST COMPARISON:  03/03/2017 FINDINGS: Cardiac shadow is within normal limits. Right-sided chest wall port is noted in satisfactory position. The lungs are well-aerated without focal infiltrate or sizable effusion. Scattered large and small bowel gas is noted. Air-fluid levels are noted within the colon new from the prior exam with an overall decrease in the amount of fecal material within the colon. There remains considerable fecal material within the right colon. No free air is seen. Surgical changes in the pelvis are again noted. No bony abnormality is seen. IMPRESSION: Overall decrease in the amount of fecal material within the colon. This is particularly noted in the transverse colon and descending colon. No acute intrathoracic abnormality is noted. Electronically Signed   By: Inez Catalina M.D.   On: 03/05/2017 10:33    Microbiology: Recent Results (from the past 240 hour(s))  Culture, blood (Routine X 2) w Reflex to ID Panel     Status: None (Preliminary result)   Collection Time: 03/09/17 10:33 AM  Result Value Ref Range Status   Specimen Description BLOOD BLOOD RIGHT HAND  Final   Special Requests IN PEDIATRIC BOTTLE Blood Culture adequate volume  Final   Culture  Final    NO GROWTH 2 DAYS Performed at [facility], [place] [address] [place], [place] [address]    Report Status PENDING  Incomplete  Culture, blood (Routine X 2) w Reflex to ID Panel     Status: None (Preliminary result)   Collection Time: 03/09/17 10:41 AM  Result Value Ref Range Status   Specimen Description BLOOD BLOOD LEFT HAND  Final   Special Requests IN PEDIATRIC BOTTLE Blood Culture adequate volume  Final   Culture   Final    NO GROWTH 2 DAYS Performed at [facility], [place] [address], [place], [place] [address]    Report Status PENDING   Incomplete  Culture, Urine     Status: Abnormal   Collection Time: 03/09/17  1:40 PM  Result Value Ref Range Status   Specimen Description URINE, RANDOM  Final   Special Requests NONE  Final   Culture MULTIPLE SPECIES PRESENT, SUGGEST RECOLLECTION (A)  Final   Report Status 03/10/2017 FINAL  Final     Labs: Basic Metabolic Panel: Recent Labs  Lab 03/05/17 0521  03/08/17 0500 03/09/17 0500 03/09/17 1041 03/10/17 0530 03/11/17 0620  NA 140   < > 137 126* 127* 134* 135  K 3.7   < > 4.0 5.0 3.9 3.4* 3.3*  CL 107   < > 99* 94* 95* 101 105  CO2 24   < > 29 24 23 25 24   GLUCOSE 84   < > 131* 343* 125* 85 97  BUN 10   < > 10 10 10 8  <5*  CREATININE 0.63   < > 0.75 0.79 0.66 0.62 0.49  CALCIUM 8.9   < > 8.6* 7.7* 8.4* 8.2* 7.9*  MG 1.8  --   --   --   --  1.9  --    < > = values in this interval not displayed.   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

## 2017-03-11 NOTE — Progress Notes (Signed)
Carla Mills is having some bleeding this morning.  Looks like she is bleeding from the lower portion of the surgical wound.  I actually spoke to her surgeon this morning.  He says it is probably bleeding from the mucous fistula.  He will go up and check her out.  She had lab work done this morning.  Her hemoglobin dropped a little bit to 9.0.  Her white cell count is 11.  Platelet count 238,000.  Her potassium is 3.3.  She is having no abdominal pain.  She ate okay yesterday.  She had no nausea or vomiting.  Her colostomy is working quite nicely.  There is no fever.  She had no cough or shortness of breath.  She has had no leg swelling.  On her physical exam, her vital signs all look stable.  She is afebrile.  Her abdomen is soft.  Bowel sounds are still decreased.  There is no distention.  There is no guarding or rebound tenderness.  Her lungs are clear.  Cardiac exam regular rate and rhythm.  Hopefully, this bleeding is something that is expected.  In talking to her surgeon, he did not think that this was going to be a problem.  I actually spoke to 1 of my colleagues down at [facility].  She is in charge of the protocols for GI malignancy.  There actually may be a protocol that Carla Mills may qualify for.  I need to make sure that the appropriate studies are sent for her tumor.  I appreciate the wonderful care that she is getting from the staff up on [address].  Lattie Haw, MD  [facility]ill[facility] Fantasia

## 2017-03-11 NOTE — Progress Notes (Signed)
I sent a message to Dr Sarajane Jews that Ms Pixler will need an orde for home health

## 2017-03-11 NOTE — Progress Notes (Signed)
D/C"D home with her spouse. Both verbalized  Understanding of instructions. Huber needle flushed with 500 units of heparin and huber needle removed. Sit wnl ,No bleeding

## 2017-03-11 NOTE — Discharge Instructions (Signed)
Colostomy Home Guide, Adult A colostomy is a surgical procedure to make an opening (stoma) for stool (feces) to leave your body. This surgery is done when a medical condition prevents stool from leaving your body through the end of the large intestine (rectum). During the surgery, part of the large intestine (colon) is attached to the stoma that is made in the front of your abdomen. A bag (pouch) is fitted over the stoma. Stool and gas will collect in the bag. After having this surgery, you will need to empty and change your colostomy bag as needed. You will also need to care for the stoma. How do I care for my stoma? Your stoma should look pink, red, and moist, like the inside of your cheek. At first, the stoma may be swollen, but this swelling will go away within 6 weeks. To care for the stoma:  Keep the skin around the stoma clean and dry.  Use a clean, soft washcloth to gently wash the stoma and the skin around it. ? Use warm water and only use cleansers recommended by your health care provider. ? Rinse the stoma area with plain water. ? Dry the area well.  Use stoma powder or ointment on your skin only as told by your health care provider. Do not use any other powders, gels, wipes, or creams on your skin.  Change your colostomy bag if your skin becomes irritated. Irritation may indicate that the bag is leaking.  Check your stoma area every day for signs of infection. Check for: ? More redness, swelling, or pain. ? More fluid or blood. ? Pus or warmth.  Measure the stoma opening regularly and record the size. Watch for changes. Share this information with your health care provider.  How do I care for my colostomy bag? The bag that fits over the stoma can have either one or two pieces.  One-piece bag: The skin barrier and the bag are combined in a single unit.  Two-piece bag: The skin barrier and the bag are separate pieces that attach to each other. 1.   Empty your bag at bedtime  and whenever it is one-third to one-half full. Do not let more stool or gas build up. This could cause the bag to leak. Some colostomy bags have a built-in gas release valve. Change the bag every 3-4 days or as told by your health care provider. Also change the bag if it is leaking or separating from the skin or your skin looks irritated. How do I empty my colostomy bag? Before you leave the hospital, you will be taught how to empty your bag. Follow these basic steps: 1. Wash your hands with soap and water. 2. Sit far back on the toilet. 3. Put several pieces of toilet paper into the toilet water. This will prevent splashing as you empty the stool into the toilet. 4. Remove the clip or the velcro from the tail end of the bag. 5. Unroll the tail, then empty stool into the toilet. 6. Clean the tail with toilet paper. 1.  7. Reroll the tail, and close it with the clip or velcro. 8. Wash your hands again.  How do I change my colostomy bag? Before you leave the hospital, you will be taught how to change your bag. Always have colostomy supplies with you, and follow these basic steps: 1. Wash your hands with soap and water. Have paper towels or tissues near you to clean any discharge. 2. Use a template to pre-cut  the skin barrier. Smooth any rough edges. 3. If using a two-piece bag, attach the bag and the skin barrier to each other. Add the barrier ring, if you use one. 4. If your stools are watery, add a few cotton balls to the new bag to absorb the liquid. 5. Remove the old bag and skin barrier. Gently push the skin away from the barrier with your fingers or a warm cloth. 6. Wash your hands again. Then clean the stoma area as directed with water or with mild soap and water. Use water to rinse away any soap. 7. Dry the skin. You may use the cool setting on a hair dryer to do this. 8. If directed, apply stoma powder or skin barrier gel to the skin. 9. Dry the skin again. 10. Warm the skin barrier  with your hands or a warm compress. 11. Remove the paper from the sticky (adhesive) strip of the skin barrier. 12. Press the adhesive strip onto the skin around the stoma. 13. Gently rub the skin barrier onto the skin. This creates heat that helps the barrier to stick. 14. Apply stoma tape to the edges of the skin barrier.  What are some general tips?  Avoid wearing clothes that are tight directly over your stoma.  You may shower or bathe with the colostomy bag on or off. Do not use harsh or oily soaps or lotions. Dry the skin and bag after bathing.  Store all supplies in a cool, dry place. Do not leave supplies in extreme heat because parts can melt.  Whenever you leave home, take an extra skin barrier and colostomy bag with you.  If your colostomy bag gets wet, you can dry it with a hair dryer on the cool setting.  To prevent odor, put drops of ostomy deodorizer in the colostomy bag. Your health care provider may also recommend putting ostomy lubricant inside the bag. This helps the stool to slide out of the bag more easily and completely. Contact a health care provider if:  You have more redness, swelling, or pain around your stoma.  You have more fluid or blood coming from your stoma.  Your stoma feels warm to the touch.  You have pus coming from your stoma.  Your stoma extends in or out farther than normal.  You need to change the bag every day.  You have a fever. Get help right away if:  Your stool is bloody.  You vomit.  You have trouble breathing. This information is not intended to replace advice given to you by your health care provider. Make sure you discuss any questions you have with your health care provider. Document Released: 03/14/2003 Document Revised: 07/20/2015 Document Reviewed: 07/14/2013 Elsevier Interactive Patient Education  2018 [facility], [place] [phone]  OPEN ABDOMINAL SURGERY: POST OP  INSTRUCTIONS  Always review your discharge instruction sheet given to you by the facility where your surgery was performed.  IF YOU HAVE DISABILITY OR FAMILY LEAVE FORMS, YOU MUST BRING THEM TO THE OFFICE FOR PROCESSING.  PLEASE DO NOT GIVE THEM TO YOUR DOCTOR.  1. A prescription for pain medication may be given to you upon discharge.  Take your pain medication as prescribed, if needed.  If narcotic pain medicine is not needed, then you may take acetaminophen (Tylenol) or ibuprofen (Advil) as needed. 2. Take your usually prescribed medications unless otherwise directed. 3. If you need a refill on your pain medication, please  contact your pharmacy. They will contact our office to request authorization.  Prescriptions will not be filled after 5pm or on week-ends. 4. You should follow a light diet the first few days after arrival home, such as soup and crackers, pudding, etc.unless your doctor has advised otherwise. A high-fiber, low fat diet can be resumed as tolerated.   Be sure to include lots of fluids daily. Most patients will experience some swelling and bruising on the chest and neck area.  Ice packs will help.  Swelling and bruising can take several days to resolve 5. Most patients will experience some swelling and bruising in the area of the incision. Ice pack will help. Swelling and bruising can take several days to resolve..  6. It is common to experience some constipation if taking pain medication after surgery.  Increasing fluid intake and taking a stool softener will usually help or prevent this problem from occurring.  A mild laxative (Milk of Magnesia or Miralax) should be taken according to package directions if there are no bowel movements after 48 hours. 7.  You may have steri-strips (small skin tapes) in place directly over the incision.  These strips should be left on the skin for 7-10 days.  If your surgeon used skin glue on the incision, you may shower in 24 hours.  The glue will  flake off over the next 2-3 weeks.  Any sutures or staples will be removed at the office during your follow-up visit. You may find that a light gauze bandage over your incision may keep your staples from being rubbed or pulled. You may shower and replace the bandage daily. 8. ACTIVITIES:  You may resume regular (light) daily activities beginning the next day--such as daily self-care, walking, climbing stairs--gradually increasing activities as tolerated.  You may have sexual intercourse when it is comfortable.  Refrain from any heavy lifting or straining until approved by your doctor. a. You may drive when you no longer are taking prescription pain medication, you can comfortably wear a seatbelt, and you can safely maneuver your car and apply brakes b. Return to Work: ___________________________________ 27. You should see your doctor in the office for a follow-up appointment approximately two weeks after your surgery.  Make sure that you call for this appointment within a day or two after you arrive home to insure a convenient appointment time. OTHER INSTRUCTIONS:  _____________________________________________________________ _____________________________________________________________  WHEN TO CALL YOUR DOCTOR: 1. Fever over 101.0 2. Inability to urinate 3. Nausea and/or vomiting 4. Extreme swelling or bruising 5. Continued bleeding from incision. 6. Increased pain, redness, or drainage from the incision. 7. Difficulty swallowing or breathing 8. Muscle cramping or spasms. 9. Numbness or tingling in hands or feet or around lips.  The clinic staff is available to answer your questions during regular business hours.  Please dont hesitate to call and ask to speak to one of the nurses if you have concerns.  For further questions, please visit [email or web address]

## 2017-03-11 NOTE — Consult Note (Signed)
[facility] Nurse ostomy consult note: Extended visit.  First post op. Stoma type/location: LLQ colostomy Stomal assessment/size: 1 and 3/8 inches slightly oval, slightly budded.  OS at center, pointing toward 12 o'clock Peristomal assessment: minor erythema circumferentially measuring 0.2cm Treatment options for stomal/peristomal skin: skin barrier ring Output: small amount of brown stool in pouch  Ostomy pouching: 2pc. Pouching system with skin barrier ring.  Patient provided with donated supplies today that are near to exactly what she has been wearing at home (she donated her own surplus supplies as she anticipated not needing an ostomy). 10 skin barriers, 10 12-inch opaque pouches with integrated gas filters and Lock and Roll closure. 2 closed end pouches provided. Patient prefers a 10 inch pouch.   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  We discuss other management strategies going forward. Patient initiated questions about returning to sexual intercourse and I encourage her to discuss with Dr. Hassell Done at her first post op appointment. If intercourse is desired prior to that appointment, I discuss with her that discomfort should be her guide and that there are several ways to meet sexual needs without penetration. She is appreciative of conversation (noted is that mother and daughter are in the room). Patient is also interested in colostomy irrigation at a later date.  I provide her with a teaching sheet on irrigation and let her know that I would be happy to teach her the procedure after Dr. Hassell Done approves-usually after 8-12 weeks. Education provided: Enrolled patient in [place] program: Yes, previously. She uses [facility] as her downstream provider.  She will work with them to obtain 10-inch pouches.  [facility] nursing team will not follow, but will remain available to this patient, the nursing and  medical teams.  Please re-consult if needed. Thanks, Maudie Flakes, MSN, RN, [place], Arther Abbott  Pager# [phone]  Time spent with patient = 75 minutes

## 2017-03-11 NOTE — Progress Notes (Signed)
4 Days Post-Op    CC:  SBO  Subjective: She is doing well this AM>  Mucus fistula leaking all over and not contained with Wound vac.  I removed that and cleaned every thing up.  Wound care is placing pouches now.  She is tolerating diet and doing well.   Objective: Vital signs in last 24 hours: Temp:  [98.8 F (37.1 C)-99.9 F (37.7 C)] 98.8 F (37.1 C) (12/18 1008) Pulse Rate:  [89-104] 89 (12/18 1008) Resp:  [16-18] 18 (12/18 1008) BP: (97-112)/(62-82) 109/71 (12/18 1008) SpO2:  [96 %-100 %] 96 % (12/18 1008) Last BM Date: 03/10/17(loose and watery) 600 Po  Urine 800 950 stool Drainage from the mucus fistula also.   Intake/Output from previous day: 12/17 0701 - 12/18 0700 In: 600 [P.O.:600] Out: 1750 [Urine:800; Stool:950] Intake/Output this shift: Total I/O In: 120 [P.O.:120] Out: 350 [Stool:350]  General appearance: alert, cooperative and no distress Resp: clear to auscultation bilaterally GI: soft, sore, ostomy looks great, some swelling but not much.  mucus fistula draining.  I cleaned everything up and it is being pouched now.    Lab Results:  Recent Labs    03/10/17 0530 03/11/17 0620  WBC 15.6* 11.0*  HGB 10.2* 9.0*  HCT 29.3* 26.2*  PLT 205 238    BMET Recent Labs    03/10/17 0530 03/11/17 0620  NA 134* 135  K 3.4* 3.3*  CL 101 105  CO2 25 24  GLUCOSE 85 97  BUN 8 <5*  CREATININE 0.62 0.49  CALCIUM 8.2* 7.9*   PT/INR No results for input(s): LABPROT, INR in the last 72 hours.  Recent Labs  Lab 03/06/17 1646  AST 19  ALT 14  ALKPHOS 63  BILITOT 1.2  PROT 6.7  ALBUMIN 3.9     Lipase     Component Value Date/Time   LIPASE 41 05/24/2016 1648     Medications: . acetaminophen  650 mg Oral Once  . famotidine  20 mg Oral BID  . 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 Johnathan Hausen  POD 4    Stage II colon CA 02/2015, recurrent stage IV colon cancer 02/2016 s/p chemotherapy Flexible sigmoidoscopy 03/06/17: Severe stenosis 13-15 cm from the anal verge, could not be traversed, BX pending - S/p/LAR w/ hartman's pouch/colostomy/appendectomy 02/20/15 - S/pLOA/colostomy reversal 11/15/15 by Dr. Rosendo Gros - S/pHyperthermic intraperitoneal chemotherapywith bilateral salpingoophrectomy, omentectomy, lysis of adhesions5/17/18 by Dr. Crisoforo Oxford, [facility] - last dose 10/2016. Followed by Dr. Marin Olp.  - Flexible sigmoidoscopy 03/06/17 :  Obstructing mass -  INVASIVE ADENOCARCINOMA  FEN: IV fluids/ full liquids =>> regular diet ID: Cefotetan pre op; Rocephin 12/15 =>> day 3 DVT: SCD Foley: None Follow up: TBD      Plan:  From our standpoint she can go home.  I will get follow up info and place in AVS.   LOS: 7 days    Sally-Anne Wamble 03/11/2017 [phone]

## 2017-03-12 ENCOUNTER — Telehealth: Payer: Self-pay | Admitting: *Deleted

## 2017-03-12 ENCOUNTER — Other Ambulatory Visit: Payer: BLUE CROSS/BLUE SHIELD

## 2017-03-12 MED ORDER — PHENAZOPYRIDINE HCL 100 MG PO TABS: 100.0000 mg | ORAL_TABLET | Freq: Three times a day (TID) | ORAL | 1 refills | Status: DC | PRN

## 2017-03-12 MED ORDER — CIPROFLOXACIN HCL 500 MG PO TABS: 500.0000 mg | ORAL_TABLET | Freq: Two times a day (BID) | ORAL | 1 refills | Status: DC

## 2017-03-12 MED FILL — SM URINARY PAIN RLF 95 MG T: 95 | 10 days supply | Qty: 30 | Fill #0

## 2017-03-12 MED FILL — CIPROFLOXACIN HCL 500 MG TA: 500 | 3 days supply | Qty: 6 | Fill #0

## 2017-03-12 NOTE — Telephone Encounter (Signed)
Patient is c/o symptoms of a UTI. She states she gets these frequently. She was discharged from the hospital yesterday and is feeling pressure and frequency. She would like an antibiotic and something to help with the discomfort.  Spoke with Dr Marin Olp. Prescriptions sent to [facility]. Patient aware of new prescription.

## 2017-03-14 LAB — CULTURE, BLOOD (ROUTINE X 2)
CULTURE: NO GROWTH
Culture: NO GROWTH
SPECIAL REQUESTS: ADEQUATE
Special Requests: ADEQUATE

## 2017-03-16 DIAGNOSIS — D509 Iron deficiency anemia, unspecified: Secondary | ICD-10-CM | POA: Diagnosis not present

## 2017-03-16 DIAGNOSIS — C189 Malignant neoplasm of colon, unspecified: Secondary | ICD-10-CM | POA: Diagnosis not present

## 2017-03-16 DIAGNOSIS — G600 Hereditary motor and sensory neuropathy: Secondary | ICD-10-CM | POA: Diagnosis not present

## 2017-03-20 ENCOUNTER — Encounter (HOSPITAL_COMMUNITY): Payer: Self-pay

## 2017-03-20 DIAGNOSIS — Z933 Colostomy status: Secondary | ICD-10-CM | POA: Diagnosis not present

## 2017-03-20 DIAGNOSIS — Z85038 Personal history of other malignant neoplasm of large intestine: Secondary | ICD-10-CM | POA: Diagnosis not present

## 2017-03-21 DIAGNOSIS — C189 Malignant neoplasm of colon, unspecified: Secondary | ICD-10-CM | POA: Diagnosis not present

## 2017-03-21 DIAGNOSIS — D509 Iron deficiency anemia, unspecified: Secondary | ICD-10-CM | POA: Diagnosis not present

## 2017-03-21 DIAGNOSIS — G600 Hereditary motor and sensory neuropathy: Secondary | ICD-10-CM | POA: Diagnosis not present

## 2017-03-24 ENCOUNTER — Encounter (HOSPITAL_COMMUNITY): Payer: Self-pay

## 2017-03-26 ENCOUNTER — Other Ambulatory Visit: Payer: Self-pay | Admitting: Hematology & Oncology

## 2017-03-26 DIAGNOSIS — C772 Secondary and unspecified malignant neoplasm of intra-abdominal lymph nodes: Principal | ICD-10-CM

## 2017-03-26 DIAGNOSIS — D509 Iron deficiency anemia, unspecified: Secondary | ICD-10-CM | POA: Diagnosis not present

## 2017-03-26 DIAGNOSIS — C189 Malignant neoplasm of colon, unspecified: Secondary | ICD-10-CM | POA: Diagnosis not present

## 2017-03-26 DIAGNOSIS — C187 Malignant neoplasm of sigmoid colon: Secondary | ICD-10-CM

## 2017-03-26 DIAGNOSIS — G600 Hereditary motor and sensory neuropathy: Secondary | ICD-10-CM | POA: Diagnosis not present

## 2017-03-27 ENCOUNTER — Encounter: Payer: Self-pay | Admitting: *Deleted

## 2017-03-27 ENCOUNTER — Other Ambulatory Visit: Payer: Self-pay | Admitting: *Deleted

## 2017-03-27 ENCOUNTER — Encounter (HOSPITAL_COMMUNITY): Payer: Self-pay

## 2017-03-27 ENCOUNTER — Ambulatory Visit (HOSPITAL_BASED_OUTPATIENT_CLINIC_OR_DEPARTMENT_OTHER): Payer: BLUE CROSS/BLUE SHIELD | Admitting: Hematology & Oncology

## 2017-03-27 ENCOUNTER — Other Ambulatory Visit: Payer: BLUE CROSS/BLUE SHIELD

## 2017-03-27 VITALS — BP 103/54 | HR 81 | Temp 97.8°F | Resp 20

## 2017-03-27 DIAGNOSIS — C187 Malignant neoplasm of sigmoid colon: Secondary | ICD-10-CM

## 2017-03-27 DIAGNOSIS — C772 Secondary and unspecified malignant neoplasm of intra-abdominal lymph nodes: Principal | ICD-10-CM

## 2017-03-27 DIAGNOSIS — C189 Malignant neoplasm of colon, unspecified: Secondary | ICD-10-CM | POA: Diagnosis not present

## 2017-03-27 DIAGNOSIS — K603 Anal fistula: Secondary | ICD-10-CM

## 2017-03-27 DIAGNOSIS — D62 Acute posthemorrhagic anemia: Secondary | ICD-10-CM

## 2017-03-27 LAB — CEA (IN HOUSE-CHCC): CEA (CHCC-IN HOUSE): 2.41 ng/mL (ref 0.00–5.00)

## 2017-03-27 LAB — CMP (CANCER CENTER ONLY)
ALBUMIN: 3.4 g/dL (ref 3.3–5.5)
ALT(SGPT): 20 U/L (ref 10–47)
AST: 15 U/L (ref 11–38)
Alkaline Phosphatase: 101 U/L — ABNORMAL HIGH (ref 26–84)
BILIRUBIN TOTAL: 0.7 mg/dL (ref 0.20–1.60)
BUN, Bld: 12 mg/dL (ref 7–22)
CALCIUM: 9.6 mg/dL (ref 8.0–10.3)
CHLORIDE: 103 meq/L (ref 98–108)
CO2: 28 meq/L (ref 18–33)
CREATININE: 0.9 mg/dL (ref 0.6–1.2)
Glucose, Bld: 94 mg/dL (ref 73–118)
Potassium: 4.5 mEq/L (ref 3.3–4.7)
SODIUM: 145 meq/L (ref 128–145)
TOTAL PROTEIN: 7.4 g/dL (ref 6.4–8.1)

## 2017-03-27 LAB — CBC WITH DIFFERENTIAL (CANCER CENTER ONLY)
BASO#: 0 10*3/uL (ref 0.0–0.2)
BASO%: 0.2 % (ref 0.0–2.0)
EOS ABS: 0.3 10*3/uL (ref 0.0–0.5)
EOS%: 3.1 % (ref 0.0–7.0)
HEMATOCRIT: 36.3 % (ref 34.8–46.6)
HEMOGLOBIN: 12.2 g/dL (ref 11.6–15.9)
LYMPH#: 1.5 10*3/uL (ref 0.9–3.3)
LYMPH%: 18.9 % (ref 14.0–48.0)
MCH: 29.8 pg (ref 26.0–34.0)
MCHC: 33.6 g/dL (ref 32.0–36.0)
MCV: 89 fL (ref 81–101)
MONO#: 0.6 10*3/uL (ref 0.1–0.9)
MONO%: 7.7 % (ref 0.0–13.0)
NEUT%: 70.1 % (ref 39.6–80.0)
NEUTROS ABS: 5.7 10*3/uL (ref 1.5–6.5)
Platelets: 351 10*3/uL (ref 145–400)
RBC: 4.1 10*6/uL (ref 3.70–5.32)
RDW: 12.7 % (ref 11.1–15.7)
WBC: 8.1 10*3/uL (ref 3.9–10.0)

## 2017-03-27 LAB — FERRITIN: Ferritin: 107 ng/ml (ref 9–269)

## 2017-03-27 LAB — IRON AND TIBC
%SAT: 11 % — ABNORMAL LOW (ref 21–57)
Iron: 31 ug/dL — ABNORMAL LOW (ref 41–142)
TIBC: 285 ug/dL (ref 236–444)
UIBC: 254 ug/dL (ref 120–384)

## 2017-03-27 NOTE — Addendum Note (Signed)
Addended by: Volanda Napoleon on: 03/27/2017 06:43 PM   Modules accepted: Orders

## 2017-03-27 NOTE — Progress Notes (Signed)
Hematology and Oncology Follow Up Visit  Carla Mills [id number] February 02, 1971 47 y.o. 03/27/2017   Principle Diagnosis:  Recurrent colon cancer - HER2 (-)/MSI stable/MMR normal/BRAF (wt) - inoperable  Past Therapy:   FOLFOXIRI - s/p cycle #12 - completed on 11/12/2016 Status post exploratory laparotomy with HIPEC - 07/29/2016  Current Therapy: Observation   Interim History:  Carla Mills is here today.unfortunately, she now has another recurrence.  This is incredibly distressing.  She was having problems with her bowel habits back in December.  She ultimately ended up in the hospital.  She had obstruction of her rectum.  She had a colonoscopy.  She was found to have a malignancy.  She had this tumor biopsied.  The pathology report ([id number]) showed invasive adenocarcinoma.  We sent off genetic studies on this.  Unfortunately, everything has Mills back negative so far.  There is PD 1- 0%, HER2(-), wt BRAF/K-RAS.  She had a normal CEA.  Her preop CT scan did not show any obvious tumor.  She underwent exploratory surgery.  She was found to have fairly extensive tumor in the pelvis.  Unfortunately none of this could be resected.  She had a colostomy.  She has done well with the colostomy.  She is still having some difficulty with recovery.  She feels tired.  She does not have a lot of energy.  She has a fistula that is still oozing.  She is trying to get in to see the surgeon.  Unfortunately her appointment was canceled and she cannot see him for 2 weeks.  I do not think this is adequate.  I will call the surgeon's office.  I have spoken with Carla Mills of radiation oncology.  We may want to consider radiation therapy to this area of recurrence.  If so, she will need Xeloda as a radio sensitizer.  We have never given her Avastin with past chemotherapy.  She has never received Erbitux or Vectibix.    She has had no obvious bleeding..  She did enjoy the holidays.  She and her husband  renewed their wedding vows on New Year's Eve.    I have spoken to her surgeon at [facility] about all of this.  He is aware of what is going on.  Currently, her performance status is ECOG 1.   Medications:  Allergies as of 03/27/2017      Reactions   Bactrim [sulfamethoxazole-trimethoprim]    Oxaprozin Hives   REACTION: Hives   Penicillins Hives   REACTION: Hives Has patient had a PCN reaction causing immediate rash, facial/tongue/throat swelling, SOB or lightheadedness with hypotension: no Has patient had a PCN reaction causing severe rash involving mucus membranes or skin necrosis: {no Has patient had a PCN reaction that required hospitalization no Has patient had a PCN reaction occurring within the last 10 years: no If all of the above answers are "NO", then may proceed with Cephalosporin use.   Sulfonamide Derivatives Swelling   Massive extremity swelling       Medication List        Accurate as of 03/27/17  2:36 PM. Always use your most recent med list.          acetaminophen 500 MG tablet Commonly known as:  TYLENOL Take 1,000 mg by mouth every 8 (eight) hours as needed for moderate pain or headache.   ciprofloxacin 500 MG tablet Commonly known as:  CIPRO Take 1 tablet (500 mg total) by mouth 2 (two) times daily. For 3 days  fexofenadine 180 MG tablet Commonly known as:  ALLEGRA Take 180 mg by mouth daily as needed for allergies.   fluticasone 50 MCG/ACT nasal spray Commonly known as:  FLONASE Place 1 spray into both nostrils 2 (two) times daily.   megestrol 20 MG tablet Commonly known as:  MEGACE   phenazopyridine 100 MG tablet Commonly known as:  PYRIDIUM Take 1 tablet (100 mg total) by mouth 3 (three) times daily as needed for pain.   traMADol 50 MG tablet Commonly known as:  ULTRAM Take 1 tablet (50 mg total) by mouth every 6 (six) hours as needed.       Allergies:  Allergies  Allergen Reactions  . Bactrim [Sulfamethoxazole-Trimethoprim]   .  Oxaprozin Hives    REACTION: Hives  . Penicillins Hives    REACTION: Hives Has patient had a PCN reaction causing immediate rash, facial/tongue/throat swelling, SOB or lightheadedness with hypotension: no Has patient had a PCN reaction causing severe rash involving mucus membranes or skin necrosis: {no Has patient had a PCN reaction that required hospitalization no Has patient had a PCN reaction occurring within the last 10 years: no If all of the above answers are "NO", then may proceed with Cephalosporin use.  . Sulfonamide Derivatives Swelling    Massive extremity swelling     Past Medical History, Surgical history, Social history, and Family History were reviewed and updated.  Review of Systems: As stated in the interim history  Physical Exam:  oral temperature is 97.8 F (36.6 C). Her blood pressure is 103/54 (abnormal) and her pulse is 81. Her respiration is 20 and oxygen saturation is 99%.   Wt Readings from Last 3 Encounters:  03/09/17 156 lb (70.8 kg)  02/27/17 158 lb 4 oz (71.8 kg)  12/24/16 170 lb (77.1 kg)    Physical Exam  Constitutional: She is oriented to person, place, and time.  HENT:  Head: Normocephalic and atraumatic.  Mouth/Throat: Oropharynx is clear and moist.  Eyes: EOM are normal. Pupils are equal, round, and reactive to light.  Neck: Normal range of motion.  Cardiovascular: Normal rate, regular rhythm and normal heart sounds.  Pulmonary/Chest: Effort normal and breath sounds normal.  Abdominal: Soft. Bowel sounds are normal.  She has a colostomy in the lower abdomen.  She has the fistula which is about 3 mm in size at the caudal end of her laparotomy scar.  The laparotomy scar is well  Musculoskeletal: Normal range of motion. She exhibits no edema, tenderness or deformity.  Lymphadenopathy:    She has no cervical adenopathy.  Neurological: She is alert and oriented to person, place, and time.  Skin: Skin is warm and dry. No rash noted. No erythema.   Psychiatric: She has a normal mood and affect. Her behavior is normal. Judgment and thought content normal.  Vitals reviewed.    Lab Results  Component Value Date   WBC 8.1 03/27/2017   HGB 12.2 03/27/2017   HCT 36.3 03/27/2017   MCV 89 03/27/2017   PLT 351 03/27/2017   Lab Results  Component Value Date   FERRITIN 107 03/27/2017   IRON 31 (L) 03/27/2017   TIBC 285 03/27/2017   UIBC 254 03/27/2017   IRONPCTSAT 11 (L) 03/27/2017   Lab Results  Component Value Date   RETICCTPCT 0.8 02/12/2015   RBC 4.10 03/27/2017   No results found for: KPAFRELGTCHN, LAMBDASER, KAPLAMBRATIO No results found for: IGGSERUM, IGA, IGMSERUM No results found for: TOTALPROTELP, ALBUMINELP, A1GS, A2GS, BETS, BETA2SER, [place], MSPIKE,  SPEI   Chemistry      Component Value Date/Time   NA 145 03/27/2017 1101   NA 141 07/30/2016 0750   K 4.5 03/27/2017 1101   K 4.2 07/30/2016 0750   CL 103 03/27/2017 1101   CO2 28 03/27/2017 1101   CO2 26 07/30/2016 0750   BUN 12 03/27/2017 1101   BUN 12.1 07/30/2016 0750   CREATININE 0.9 03/27/2017 1101   CREATININE 0.7 07/30/2016 0750      Component Value Date/Time   CALCIUM 9.6 03/27/2017 1101   CALCIUM 9.5 07/30/2016 0750   ALKPHOS 101 (H) 03/27/2017 1101   ALKPHOS 72 07/30/2016 0750   AST 15 03/27/2017 1101   AST 22 07/30/2016 0750   ALT 20 03/27/2017 1101   ALT 22 07/30/2016 0750   BILITOT 0.70 03/27/2017 1101   BILITOT 0.54 07/30/2016 0750      Impression and Plan: Ms. Kyser is a very pleasant 47 yo caucasian female with recurrent colon cancer - HER2 (-)/MSI stable/MMR normal/BRAF -.   Because she healed.  This really is a difficult problem.  The fact that this is yet a recurrence after having incredibly aggressive surgery with HIPEC is incredibly worrisome.  Unfortunately, we have nothing with her tumor that we can use with respect to immunotherapy.  Again, we will see about radiotherapy to the recurrence.  We will see what her PET scan  shows.  I might refer her down to [facility].  They have a very good experimental program for colon cancer which Ms. Equihua might qualify for.  We will see what her labs are.  We will see what her iron studies show.  The fact that her CEA really has never been elevated is a real problem.  Hopefully the PET scan will be done next week.  I will see her back in 2 weeks.  Volanda Napoleon, MD 1/3/20192:36 PM

## 2017-03-28 ENCOUNTER — Telehealth: Payer: Self-pay | Admitting: Hematology & Oncology

## 2017-03-28 ENCOUNTER — Telehealth: Payer: Self-pay | Admitting: *Deleted

## 2017-03-28 NOTE — Telephone Encounter (Addendum)
Patient is aware of results. Appointment made   ----- Message from Volanda Napoleon, MD sent at 03/27/2017  6:41 PM EST ----- Call - the iron level is low!!  She needs Injectafer!!!  Please set up for next week!!!  Dierdre Searles, MD  P Onc Nurse Hp        Call - labs look ok so far!! Laurey Arrow

## 2017-03-28 NOTE — Telephone Encounter (Signed)
Enrollment Success! APPROVED COPAY Under the [facility], qualifying patients always pay the first $50 out of pocket for Injectafer for the first dose and pay as little as $0 out of pocket for the second dose. Qualifying patients can receive up to $500 in assistance towards their out of pocket costs for each dose of Injectafer. A single enrollment in the program covers up to two doses, or a maximum of $1000. For further courses of treatment, re-enrollment is required. One re-enrollment is allowed per 58-month period. Confirmation Number: [id number] Patient Name: Carla Mills Patient ID: [id number] Enrollment Expiration Date: 03/27/2018 Card Number: [id number] Date (MM/YY): 11/23 Please keep the above information for your records.  Remember, this program provides assistance on your prescription co-payment only. It cannot be used to satisfy any other treatment expense. Please note, you are still responsible for any additional co-payment beyond the maximum benefit per dose provided by this program, depending on your insurance benefit.   Physicians, office personnel, or patients will be required to fax or upload via the website supporting documentation, typically in the form of an Explanation of Benefits (EOB) after each dose. The toll free fax number is [phone]. After the EOB is reviewed and approved, the appropriate funds will be loaded to the virtual debit card within 2 business days. If you require more information about your virtual debit card, please call this toll-free number: [phone] (Monday - Friday, 9:00 am to 5:00 pm ET).   If your physician is unable to process your card, please call [phone]. (9:00 AM ET to 5:00 PM ET, Monday through Friday, except holidays)   All documentation can also be mailed to: [address] [place], [place] [address].  IMPORTANT SAFETY INFORMATION   INDICATIONS  Injectafer (ferric carboxymaltose  injection) is an iron replacement product indicated for the treatment of iron deficiency anemia (IDA) in adult patients who have intolerance to oral iron or have had unsatisfactory response to oral iron, and in adult patients with non-dialysis dependent chronic kidney disease.  IMPORTANT SAFETY INFORMATION  CONTRAINDICATIONS  Injectafer is contraindicated in patients with hypersensitivity to [facility] or any of its inactive components.  WARNINGS AND PRECAUTIONS  Serious hypersensitivity reactions, including anaphylactic-type reactions, some of which have been life-threatening and fatal, have been reported in patients receiving Injectafer. Patients may present with shock, clinically significant hypotension, loss of consciousness, and/or collapse. Monitor patients for signs and symptoms of hypersensitivity during and after Injectafer administration for at least 30 minutes and until clinically stable following completion of the infusion. Only administer Injectafer when personnel and therapies are immediately available for the treatment of serious hypersensitivity reactions. In clinical trials, serious anaphylactic/anaphylactoid reactions were reported in 0.1% (04/1773) of subjects receiving Injectafer. Other serious or severe adverse reactions potentially associated with hypersensitivity which included, but were not limited to, pruritus, rash, urticaria, wheezing, or hypotension were reported in 1.5% (26/1775) of these subjects.  In clinical studies, hypertension was reported in 3.8% (67/1775) of subjects. Transient elevations in systolic blood pressure, sometimes occurring with facial flushing, dizziness, or nausea were observed in 6% (106/1775) of subjects. These elevations generally occurred immediately after dosing and resolved within 30 minutes. Monitor patients for signs and symptoms of hypertension following each Injectafer administration.  In the 24 hours following administration of Injectafer,  laboratory assays may overestimate serum iron and transferrin bound iron by also measuring the iron in Injectafer.  ADVERSE REACTIONS  In two randomized clinical studies, a total of 1775  patients were exposed to Injectafer, 15 mg/kg of body weight, up to a single maximum dose of 750 mg of iron on two occasions, separated by at least 7 days, up to a cumulative dose of 1500 mg of iron. Adverse reactions reported by ?2% of Injectafer-treated patients were nausea (7.2%); hypertension (3.8%); flushing/hot flush (3.6%); blood phosphorus decrease (2.1%); and dizziness (2.0%). The following serious adverse reactions have been most commonly reported from the post-marketing spontaneous reports: urticaria, dyspnea, pruritus, tachycardia, erythema, pyrexia, chest discomfort, chills, angioedema, back pain, arthralgia, and syncope.

## 2017-04-01 ENCOUNTER — Inpatient Hospital Stay: Payer: BLUE CROSS/BLUE SHIELD | Attending: Hematology & Oncology

## 2017-04-01 DIAGNOSIS — K625 Hemorrhage of anus and rectum: Secondary | ICD-10-CM | POA: Insufficient documentation

## 2017-04-01 DIAGNOSIS — D509 Iron deficiency anemia, unspecified: Secondary | ICD-10-CM | POA: Insufficient documentation

## 2017-04-01 DIAGNOSIS — C189 Malignant neoplasm of colon, unspecified: Secondary | ICD-10-CM | POA: Diagnosis not present

## 2017-04-01 DIAGNOSIS — D62 Acute posthemorrhagic anemia: Secondary | ICD-10-CM

## 2017-04-01 DIAGNOSIS — D500 Iron deficiency anemia secondary to blood loss (chronic): Secondary | ICD-10-CM

## 2017-04-01 MED ORDER — SODIUM CHLORIDE 0.9 % IV SOLN
750.0000 mg | Freq: Once | INTRAVENOUS | Status: AC
  Administered 2017-04-01: 750 mg via INTRAVENOUS
  Filled 2017-04-01: qty 15

## 2017-04-01 NOTE — Patient Instructions (Signed)
Ferric carboxymaltose injection What is this medicine? FERRIC CARBOXYMALTOSE (ferr-ik car-box-ee-mol-toes) is an iron complex. Iron is used to make healthy red blood cells, which carry oxygen and nutrients throughout the body. This medicine is used to treat anemia in people with chronic kidney disease or people who cannot take iron by mouth. This medicine may be used for other purposes; ask your health care provider or pharmacist if you have questions. COMMON BRAND NAME(S): Injectafer What should I tell my health care provider before I take this medicine? They need to know if you have any of these conditions: -anemia not caused by low iron levels -high levels of iron in the blood -liver disease -an unusual or allergic reaction to iron, other medicines, foods, dyes, or preservatives -pregnant or trying to get pregnant -breast-feeding How should I use this medicine? This medicine is for infusion into a vein. It is given by a health care professional in a hospital or clinic setting. Talk to your pediatrician regarding the use of this medicine in children. Special care may be needed. Overdosage: If you think you have taken too much of this medicine contact a poison control center or emergency room at once. NOTE: This medicine is only for you. Do not share this medicine with others. What if I miss a dose? It is important not to miss your dose. Call your doctor or health care professional if you are unable to keep an appointment. What may interact with this medicine? Do not take this medicine with any of the following medications: -deferoxamine -dimercaprol -other iron products This medicine may also interact with the following medications: -chloramphenicol -deferasirox This list may not describe all possible interactions. Give your health care provider a list of all the medicines, herbs, non-prescription drugs, or dietary supplements you use. Also tell them if you smoke, drink alcohol, or use  illegal drugs. Some items may interact with your medicine. What should I watch for while using this medicine? Visit your doctor or health care professional regularly. Tell your doctor if your symptoms do not start to get better or if they get worse. You may need blood work done while you are taking this medicine. You may need to follow a special diet. Talk to your doctor. Foods that contain iron include: whole grains/cereals, dried fruits, beans, or peas, leafy green vegetables, and organ meats (liver, kidney). What side effects may I notice from receiving this medicine? Side effects that you should report to your doctor or health care professional as soon as possible: -allergic reactions like skin rash, itching or hives, swelling of the face, lips, or tongue -breathing problems -changes in blood pressure -feeling faint or lightheaded, falls -flushing, sweating, or hot feelings Side effects that usually do not require medical attention (report to your doctor or health care professional if they continue or are bothersome): -changes in taste -constipation -dizziness -headache -nausea -pain, redness, or irritation at site where injected -vomiting This list may not describe all possible side effects. Call your doctor for medical advice about side effects. You may report side effects to FDA at 1-800-FDA-1088. Where should I keep my medicine? This drug is given in a hospital or clinic and will not be stored at home. NOTE: This sheet is a summary. It may not cover all possible information. If you have questions about this medicine, talk to your doctor, pharmacist, or health care provider.  2018 Elsevier/Gold Standard (2015-04-13 11:20:47)  

## 2017-04-08 ENCOUNTER — Encounter (HOSPITAL_COMMUNITY): Payer: Self-pay

## 2017-04-09 ENCOUNTER — Ambulatory Visit (HOSPITAL_COMMUNITY): Payer: BLUE CROSS/BLUE SHIELD

## 2017-04-10 ENCOUNTER — Encounter (HOSPITAL_COMMUNITY)
Admission: RE | Admit: 2017-04-10 | Discharge: 2017-04-10 | Disposition: A | Discharge status: Home / Self Care | Payer: BLUE CROSS/BLUE SHIELD | Source: Ambulatory Visit | Attending: Hematology & Oncology | Admitting: Hematology & Oncology

## 2017-04-10 DIAGNOSIS — C187 Malignant neoplasm of sigmoid colon: Secondary | ICD-10-CM | POA: Insufficient documentation

## 2017-04-10 DIAGNOSIS — C189 Malignant neoplasm of colon, unspecified: Secondary | ICD-10-CM | POA: Diagnosis not present

## 2017-04-10 DIAGNOSIS — C772 Secondary and unspecified malignant neoplasm of intra-abdominal lymph nodes: Secondary | ICD-10-CM | POA: Insufficient documentation

## 2017-04-10 LAB — GLUCOSE, CAPILLARY: GLUCOSE-CAPILLARY: 78 mg/dL (ref 65–99)

## 2017-04-10 MED ORDER — FLUDEOXYGLUCOSE F - 18 (FDG) INJECTION
6.9000 | Freq: Once | INTRAVENOUS | Status: AC | PRN
  Administered 2017-04-10: 6.9 via INTRAVENOUS

## 2017-04-11 ENCOUNTER — Inpatient Hospital Stay: Payer: BLUE CROSS/BLUE SHIELD

## 2017-04-11 ENCOUNTER — Other Ambulatory Visit: Payer: Self-pay

## 2017-04-11 ENCOUNTER — Inpatient Hospital Stay (HOSPITAL_BASED_OUTPATIENT_CLINIC_OR_DEPARTMENT_OTHER): Payer: BLUE CROSS/BLUE SHIELD | Admitting: Hematology & Oncology

## 2017-04-11 ENCOUNTER — Encounter: Payer: Self-pay | Admitting: Hematology & Oncology

## 2017-04-11 DIAGNOSIS — C189 Malignant neoplasm of colon, unspecified: Secondary | ICD-10-CM

## 2017-04-11 DIAGNOSIS — C772 Secondary and unspecified malignant neoplasm of intra-abdominal lymph nodes: Principal | ICD-10-CM

## 2017-04-11 DIAGNOSIS — K625 Hemorrhage of anus and rectum: Secondary | ICD-10-CM

## 2017-04-11 DIAGNOSIS — D62 Acute posthemorrhagic anemia: Secondary | ICD-10-CM

## 2017-04-11 DIAGNOSIS — D509 Iron deficiency anemia, unspecified: Secondary | ICD-10-CM | POA: Diagnosis not present

## 2017-04-11 DIAGNOSIS — C187 Malignant neoplasm of sigmoid colon: Secondary | ICD-10-CM

## 2017-04-11 LAB — CBC WITH DIFFERENTIAL (CANCER CENTER ONLY)
Basophils Absolute: 0 10*3/uL (ref 0.0–0.1)
Basophils Relative: 0 %
EOS PCT: 2 %
Eosinophils Absolute: 0.3 10*3/uL (ref 0.0–0.5)
HCT: 39.3 % (ref 34.8–46.6)
Hemoglobin: 13.2 g/dL (ref 11.6–15.9)
LYMPHS ABS: 1.6 10*3/uL (ref 0.9–3.3)
LYMPHS PCT: 15 %
MCH: 29.5 pg (ref 26.0–34.0)
MCHC: 33.6 g/dL (ref 32.0–36.0)
MCV: 87.9 fL (ref 81.0–101.0)
MONO ABS: 0.5 10*3/uL (ref 0.1–0.9)
MONOS PCT: 4 %
Neutro Abs: 7.9 10*3/uL — ABNORMAL HIGH (ref 1.5–6.5)
Neutrophils Relative %: 79 %
Platelet Count: 328 10*3/uL (ref 145–400)
RBC: 4.47 MIL/uL (ref 3.70–5.32)
RDW: 14.1 % (ref 11.1–15.7)
WBC Count: 10.2 10*3/uL (ref 3.9–10.3)

## 2017-04-11 LAB — CMP (CANCER CENTER ONLY)
ALBUMIN: 4.1 g/dL (ref 3.5–5.0)
ALK PHOS: 78 U/L (ref 26–84)
ALT: 18 U/L (ref 0–55)
AST: 18 U/L (ref 5–34)
Anion gap: 13 (ref 5–15)
BUN: 13 mg/dL (ref 7–22)
CHLORIDE: 103 mmol/L (ref 98–108)
CO2: 27 mmol/L (ref 18–33)
CREATININE: 0.4 mg/dL — AB (ref 0.60–1.10)
Calcium: 9.5 mg/dL (ref 8.0–10.3)
GLUCOSE: 132 mg/dL — AB (ref 73–118)
Potassium: 4 mmol/L (ref 3.5–5.1)
SODIUM: 143 mmol/L (ref 128–145)
Total Bilirubin: 0.8 mg/dL (ref 0.2–1.2)
Total Protein: 7.5 g/dL (ref 6.4–8.1)

## 2017-04-11 MED ORDER — MORPHINE SULFATE 15 MG PO TABS: 7.5000 mg | ORAL_TABLET | ORAL | 0 refills | Status: DC | PRN

## 2017-04-11 MED FILL — MORPHINE SULFATE IR 15 MG T: 15 | 7 days supply | Qty: 21 | Fill #0

## 2017-04-11 NOTE — Progress Notes (Signed)
Hematology and Oncology Follow Up Visit  Carla Mills [id number] April 05, 1970 47 y.o. 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  We did get a PET scan on her.  Unfortunately, the PET scan does show fairly extensive recurrence of her colon cancer.  She has intense activity associate with a recurrence of the rectal anastomosis.  She has metastatic adenopathy in the perirectal fat.  She has activity associated with the colocutaneous fistula.  Also there appears to be some abdominal wall metastasis along the ventral midline.  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  I have already spoken with Dr. Lauree [facility] at [facility].  She has a clinical trial that would be available for Carla Mills.  I really think that this would be the best way to go for her right now.  She is having rectal bleeding.  I can understand this by the PET scan.  She is having a bit more in the way of rectal pain.  She takes tramadol.  This really is not helping that much.  As such, I will try her on MSIR (7.5 mg p.o. every 4 hours as needed) to see if this can help.  This clearly is a very difficult situation.  I think her tumor is being "driven" by the K-RAS mutation.  She has lost weight.  I think this also is a little bit of an ominous factor.  She has had no nausea or vomiting.  She has had no cough or shortness of breath.  There is no leg swelling.  She is still trying to stay active.  Overall, I would say performance status is ECOG 1.   Medications:  Allergies as of 04/11/2017      Reactions   Bactrim [sulfamethoxazole-trimethoprim]    Oxaprozin Hives   REACTION: Hives   Penicillins Hives   REACTION: Hives Has patient had a PCN reaction causing immediate rash, facial/tongue/throat swelling, SOB or lightheadedness with hypotension: no Has patient had a PCN reaction causing severe rash involving mucus membranes or skin necrosis: {no Has patient had a PCN reaction that required hospitalization no Has patient had a PCN reaction occurring within the last 10 years: no If all of the above answers are "NO", then may proceed with Cephalosporin use.   Sulfonamide Derivatives Swelling   Massive extremity swelling       Medication List        Accurate as of 04/11/17  4:51 PM. Always use your most recent med list.          acetaminophen 500 MG tablet Commonly known as:  TYLENOL Take 1,000 mg by mouth every 8 (eight) hours as needed for moderate pain or headache.   ciprofloxacin 500 MG tablet Commonly known as:  CIPRO Take 1 tablet (500 mg total) by mouth 2 (two) times daily. For 3 days   fexofenadine 180 MG tablet Commonly known as:  ALLEGRA Take 180 mg by mouth daily as needed for allergies.   fluticasone 50 MCG/ACT nasal spray Commonly known as:  FLONASE Place 1 spray into both nostrils 2 (two) times daily.   megestrol 20 MG tablet Commonly known as:  MEGACE   morphine 15 MG tablet Commonly known as:  MSIR Take 0.5 tablets (7.5 mg total) by mouth every 4 (four) hours as needed for severe pain.   phenazopyridine 100 MG tablet Commonly known as:  PYRIDIUM Take 1 tablet (100 mg total) by mouth 3 (three) times daily as needed for pain.   traMADol 50 MG tablet Commonly known as:  ULTRAM Take 1 tablet (50 mg total) by mouth every 6 (six) hours as needed.       Allergies:  Allergies  Allergen Reactions  . Bactrim [Sulfamethoxazole-Trimethoprim]   . Oxaprozin Hives    REACTION: Hives  . Penicillins Hives    REACTION: Hives Has patient had a PCN reaction causing immediate rash, facial/tongue/throat swelling, SOB or lightheadedness with hypotension: no Has patient  had a PCN reaction causing severe rash involving mucus membranes or skin necrosis: {no Has patient had a PCN reaction that required hospitalization no Has patient had a PCN reaction occurring within the last 10 years: no If all of the above answers are "NO", then may proceed with Cephalosporin use.  . Sulfonamide Derivatives Swelling    Massive extremity swelling     Past Medical History, Surgical history, Social history, and Family History were reviewed and updated.  Review of Systems: Review of Systems  Constitutional: Positive for malaise/fatigue and weight loss.  Gastrointestinal: Positive for abdominal pain and blood in stool.  All other systems reviewed and are negative.   Physical Exam:  vitals were not taken for this visit.   Wt Readings from Last 3 Encounters:  04/11/17 145 lb 0.6 oz (65.8 kg)  03/09/17 156 lb (70.8 kg)  02/27/17 158 lb 4 oz (71.8 kg)    Physical Exam  Constitutional: She is oriented to person, place, and time.  HENT:  Head: Normocephalic and atraumatic.  Mouth/Throat: Oropharynx is clear and moist.  Eyes: EOM are normal. Pupils are equal, round, and reactive to light.  Neck: Normal range of motion.  Cardiovascular: Normal rate, regular rhythm and normal heart sounds.  Pulmonary/Chest: Effort normal and breath sounds normal.  Abdominal: Soft. Bowel sounds are normal.  She has a colostomy in the lower abdomen.  She has the fistula which is about 3 mm in size at the caudal end of her laparotomy scar.  The laparotomy scar is well  Musculoskeletal: Normal range of motion. She exhibits no edema, tenderness or deformity.  Lymphadenopathy:    She has no cervical adenopathy.  Neurological: She is alert and oriented to person, place, and time.  Skin: Skin is warm and dry. No rash noted. No erythema.  Psychiatric: She has a normal mood and affect. Her behavior is normal. Judgment and thought content normal.  Vitals reviewed.    Lab Results  Component  Value Date   WBC 10.2 04/11/2017   HGB 12.2 03/27/2017   HCT 39.3 04/11/2017   MCV 87.9 04/11/2017   PLT 328 04/11/2017   Lab Results  Component Value Date   FERRITIN 107 03/27/2017   IRON 31 (L) 03/27/2017   TIBC 285 03/27/2017   UIBC 254 03/27/2017   IRONPCTSAT 11 (L) 03/27/2017   Lab Results  Component Value Date   RETICCTPCT 0.8 02/12/2015   RBC 4.47 04/11/2017   No results found for: KPAFRELGTCHN, LAMBDASER, KAPLAMBRATIO No results found for: IGGSERUM, IGA, IGMSERUM No results found for: TOTALPROTELP, ALBUMINELP, A1GS, A2GS, Violet Baldy, MSPIKE, SPEI   Chemistry      Component Value Date/Time   NA 143 04/11/2017 1212   NA 145 03/27/2017 1101   NA 141  07/30/2016 0750   K 4.0 04/11/2017 1212   K 4.5 03/27/2017 1101   K 4.2 07/30/2016 0750   CL 103 04/11/2017 1212   CL 103 03/27/2017 1101   CO2 27 04/11/2017 1212   CO2 28 03/27/2017 1101   CO2 26 07/30/2016 0750   BUN 13 04/11/2017 1212   BUN 12 03/27/2017 1101   BUN 12.1 07/30/2016 0750   CREATININE 0.9 03/27/2017 1101   CREATININE 0.7 07/30/2016 0750      Component Value Date/Time   CALCIUM 9.5 04/11/2017 1212   CALCIUM 9.6 03/27/2017 1101   CALCIUM 9.5 07/30/2016 0750   ALKPHOS 78 04/11/2017 1212   ALKPHOS 101 (H) 03/27/2017 1101   ALKPHOS 72 07/30/2016 0750   AST 18 04/11/2017 1212   AST 22 07/30/2016 0750   ALT 18 04/11/2017 1212   ALT 20 03/27/2017 1101   ALT 22 07/30/2016 0750   BILITOT 0.8 04/11/2017 1212   BILITOT 0.54 07/30/2016 0750      Impression and Plan: Ms. Cothron is a very pleasant 47 yo caucasian female with recurrent colon cancer - HER2 (-)/KRAS (+)/MSI stable/MMR normal/BRAF -.   I troubled by this rectal bleeding that she has.  When I checked her rectal exam, there is no blood that I could find.  There is no external hemorrhoids.  She had no tumor penetrating through the anus.  Hopefully, she will be able to get on protocol quickly.  If not, we may have to resort to  radiation therapy along with Xeloda to try to help with this rectal anastomotic recurrence.  I am sure this is where her bleeding is coming from.  I spent about 45 minutes with she and her husband.  I have known them for a while.  I just hate that she has had so much treatment has been through incredibly aggressive surgery with the HIPEC procedure back in May 2018.  She and her husband both understand that what we are dealing with is not curable but it is still treatable so that she will have a better quality of life.  We will await to hear what Dr. Aleatha Borer has to say before we set her up with any appointments with [place].   Volanda Napoleon, MD 1/18/20194:51 PM

## 2017-04-14 ENCOUNTER — Ambulatory Visit: Payer: BLUE CROSS/BLUE SHIELD

## 2017-04-14 ENCOUNTER — Ambulatory Visit: Payer: BLUE CROSS/BLUE SHIELD | Admitting: Radiation Oncology

## 2017-04-14 LAB — CEA (IN HOUSE-CHCC): CEA (CHCC-IN HOUSE): 2.25 ng/mL (ref 0.00–5.00)

## 2017-04-21 DIAGNOSIS — C189 Malignant neoplasm of colon, unspecified: Secondary | ICD-10-CM | POA: Diagnosis not present

## 2017-04-21 DIAGNOSIS — Z6824 Body mass index (BMI) 24.0-24.9, adult: Secondary | ICD-10-CM | POA: Diagnosis not present

## 2017-04-21 DIAGNOSIS — C20 Malignant neoplasm of rectum: Secondary | ICD-10-CM | POA: Diagnosis not present

## 2017-04-21 DIAGNOSIS — K7689 Other specified diseases of liver: Secondary | ICD-10-CM | POA: Diagnosis not present

## 2017-04-21 DIAGNOSIS — C19 Malignant neoplasm of rectosigmoid junction: Secondary | ICD-10-CM | POA: Diagnosis not present

## 2017-04-21 DIAGNOSIS — C7962 Secondary malignant neoplasm of left ovary: Secondary | ICD-10-CM | POA: Diagnosis not present

## 2017-04-21 DIAGNOSIS — Z9221 Personal history of antineoplastic chemotherapy: Secondary | ICD-10-CM | POA: Diagnosis not present

## 2017-04-21 DIAGNOSIS — Z933 Colostomy status: Secondary | ICD-10-CM | POA: Diagnosis not present

## 2017-04-21 DIAGNOSIS — Z880 Allergy status to penicillin: Secondary | ICD-10-CM | POA: Diagnosis not present

## 2017-04-21 DIAGNOSIS — C786 Secondary malignant neoplasm of retroperitoneum and peritoneum: Secondary | ICD-10-CM | POA: Diagnosis not present

## 2017-04-21 DIAGNOSIS — C7961 Secondary malignant neoplasm of right ovary: Secondary | ICD-10-CM | POA: Diagnosis not present

## 2017-04-21 DIAGNOSIS — Z882 Allergy status to sulfonamides status: Secondary | ICD-10-CM | POA: Diagnosis not present

## 2017-04-21 DIAGNOSIS — Z90722 Acquired absence of ovaries, bilateral: Secondary | ICD-10-CM | POA: Diagnosis not present

## 2017-04-21 MED FILL — MORPHINE SULFATE IR 15 MG T: 15 | 3 days supply | Qty: 9 | Fill #1

## 2017-04-22 ENCOUNTER — Other Ambulatory Visit: Payer: BLUE CROSS/BLUE SHIELD

## 2017-04-22 ENCOUNTER — Other Ambulatory Visit: Payer: Self-pay | Admitting: *Deleted

## 2017-04-22 ENCOUNTER — Encounter: Payer: Self-pay | Admitting: Radiation Oncology

## 2017-04-22 ENCOUNTER — Other Ambulatory Visit: Payer: Self-pay | Admitting: Hematology & Oncology

## 2017-04-22 ENCOUNTER — Ambulatory Visit: Payer: BLUE CROSS/BLUE SHIELD | Admitting: Hematology & Oncology

## 2017-04-22 DIAGNOSIS — C187 Malignant neoplasm of sigmoid colon: Secondary | ICD-10-CM | POA: Diagnosis not present

## 2017-04-22 MED ORDER — CAPECITABINE 500 MG PO TABS: 1500.0000 mg | ORAL_TABLET | Freq: Two times a day (BID) | ORAL | 0 refills | Status: DC

## 2017-04-22 NOTE — Progress Notes (Signed)
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  Biopsies revealed:   03/06/17 Diagnosis Colon, biopsy, stricture - INVASIVE ADENOCARCINOMA.  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 Radiation therapy along with Xeloda.  Weight changes, if any: has lost about 10 lbs in last   Bowel/Bladder complaints, if any: yes - has urinary frequency.  Reports having blood and bowel from her rectum 10-15 times a day.  Also has a colostomy.  Nausea / Vomiting, if any: no  Pain issues, if any:  Yes - in lower back/rectal area.  Takes morphine 7.5 mg 3-4 times a day and tramadol at bedtime  Any blood per rectum:   yes  SAFETY ISSUES:  Prior radiation? no  Pacemaker/ICD? no  Possible current pregnancy? no  Is the patient on methotrexate? no  Current Complaints/Details:  Patient is here with her mother.  BP 119/73 (BP Location: Left Arm, Patient Position: Sitting)   Pulse 92   Temp 98.2 F (36.8 C) (Oral)   Ht _0  (1.6 m)   Wt 147 lb 3.2 oz (66.8 kg)   SpO2 100%   BMI 26.08 kg/m    Wt Readings from Last 3 Encounters:  04/23/17 147 lb 3.2 oz (66.8 kg)  04/11/17 145 lb 0.6 oz (65.8 kg)  03/09/17 156 lb (70.8 kg)

## 2017-04-22 NOTE — Progress Notes (Unsigned)
Is is is

## 2017-04-23 ENCOUNTER — Telehealth: Payer: Self-pay | Admitting: Hematology & Oncology

## 2017-04-23 ENCOUNTER — Ambulatory Visit
Admission: RE | Admit: 2017-04-23 | Discharge: 2017-04-23 | Disposition: A | Discharge status: Home / Self Care | Payer: BLUE CROSS/BLUE SHIELD | Source: Ambulatory Visit | Attending: Radiation Oncology | Admitting: Radiation Oncology

## 2017-04-23 ENCOUNTER — Telehealth: Payer: Self-pay | Admitting: Pharmacist

## 2017-04-23 ENCOUNTER — Encounter: Payer: Self-pay | Admitting: Radiation Oncology

## 2017-04-23 ENCOUNTER — Other Ambulatory Visit: Payer: Self-pay

## 2017-04-23 DIAGNOSIS — C187 Malignant neoplasm of sigmoid colon: Secondary | ICD-10-CM

## 2017-04-23 DIAGNOSIS — Z933 Colostomy status: Secondary | ICD-10-CM | POA: Diagnosis not present

## 2017-04-23 DIAGNOSIS — G893 Neoplasm related pain (acute) (chronic): Secondary | ICD-10-CM | POA: Diagnosis not present

## 2017-04-23 DIAGNOSIS — C772 Secondary and unspecified malignant neoplasm of intra-abdominal lymph nodes: Secondary | ICD-10-CM

## 2017-04-23 MED ORDER — MORPHINE SULFATE ER 30 MG PO TBCR: 30.0000 mg | EXTENDED_RELEASE_TABLET | Freq: Two times a day (BID) | ORAL | 0 refills | Status: DC

## 2017-04-23 MED ORDER — CAPECITABINE 500 MG PO TABS: 1500.0000 mg | ORAL_TABLET | Freq: Two times a day (BID) | ORAL | 0 refills | Status: DC

## 2017-04-23 MED FILL — MORPHINE SULF ER 30 MG TAB: 30 | 15 days supply | Qty: 30 | Fill #0

## 2017-04-23 NOTE — Telephone Encounter (Signed)
Oral Oncology Patient Advocate Encounter  Received notification from [facility] that prior authorization for Xeloda is required.  PA submitted on CoverMyMeds Key [id number] Status is pending  Oral Oncology Clinic will continue to follow.     [facility] Patient Advocate [phone] 04/23/2017 9:19 AM

## 2017-04-23 NOTE — Telephone Encounter (Signed)
Oral Oncology Patient Advocate Encounter  Prior Authorization for Xeloda has been approved.    PA# Covermymeds Effective dates: 04/23/2017 through 04/22/2018  Oral Oncology Clinic will continue to follow.   Called patient to let her know to be looking for a call for [facility].    [facility] Patient Advocate [phone] 04/23/2017 3:34 PM

## 2017-04-23 NOTE — Progress Notes (Signed)
Radiation Oncology         [phone]) [phone] ________________________________  Initial outpatient Consultation  Name: Priya Matsen MRN: [id number]  Date: 04/23/2017  DOB: 02/28/1971  CZ:[id number], No Pcp Per  Volanda Napoleon, MD   REFERRING PHYSICIAN: Volanda Napoleon, MD  DIAGNOSIS: recurrent unresectable colon cancer, KRAS mutation  HISTORY OF PRESENT ILLNESS::Sharlette Mocarski is a very pleasant 47 y.o. female who is seen out of the courtesy of Dr. Marin Olp for an opinion concerning radiation therapy as part of management of patient's recurrent colon cancer. The patient initially presented with a sigmoid and rectal mass back in November 2016. She is taking the operating room by Dr. Rosendo Gros at which time the patient underwent low anterior resection with Hartman's pouch and colostomy. Intraoperatively the patient was felt to have a perforation of the sigmoid colon with stool and purulence. Pathology at that time was significant for invasive adenocarcinoma, moderately differentiated, spanning 9.5 cm. The adenocarcinoma did extend through the colonic wall with associated transmural perforation. Patient was found to have one pericolonic lymph node with direct extension of adenocarcinoma. 3 remaining lymph nodes showed no evidence of metastatic disease. The proximal and distal surgical margins were clear. She proceeded with adjuvant therapy at that time  In December 14 ,2008 the patient was taken to the operating room by Dr. Kaylyn Lim and Leighton Ruff at which time the patient was found to have an obstructing recurrent colon cancer within the pelvis. The patient had a laparotomy, creation of the mucous fistula in the inferior aspect of the incision with end colostomy. Intraoperatively the patient had an unresectable tumor down near the vaginal region.  Patient has been having increasing pain in the pelvis area requiring morphine. She proceeded to undergo PET scan after her last surgery which  unfortunately showed a significant recurrence within the pelvis region.   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    PAST SURGICAL HISTORY: Past Surgical History:  Procedure Laterality Date  . APPENDECTOMY  02/20/15   Abnormal appearing  . CESAREAN SECTION     x 2  . COLON RESECTION N/A 03/07/2017   Procedure: EXPLORATORY LAPAROTOMY AND CREATION OF A COLOSTOMY AND MUCOUS FISTULA;  Surgeon: Johnathan Hausen, MD;  Location: [facility];  Service: General;  Laterality: N/A;  . COLONOSCOPY N/A 10/09/2015   Procedure: COLONOSCOPY;  Surgeon: Leighton Ruff, MD;  Location: [facility];  Service: Endoscopy;  Laterality: N/A;  . COLOSTOMY  02/20/15   Sigmoid/rectal mass  . COLOSTOMY N/A 02/20/2015   Procedure: COLOSTOMY;  Surgeon: [name], MD;  Location: [place];  Service: General;  Laterality: N/A;  . COLOSTOMY REVERSAL N/A 11/15/2015   Procedure: COLOSTOMY REVERSAL;  Surgeon: [name], MD;  Location: [facility];  Service: General;  Laterality: N/A;  . EXPLORATORY LAPAROTOMY  02/20/15   Sigmoid/rectal mass  . FLEXIBLE SIGMOIDOSCOPY N/A 02/15/2015   Procedure: FLEXIBLE SIGMOIDOSCOPY;  Surgeon: [name], MD;  Location[facility];  Service: Endoscopy;  Laterality: N/A;  . FLEXIBLE SIGMOIDOSCOPY N/A 03/06/2017   Procedure: FLEXIBLE SIGMOIDOSCOPY;  Surgeon: Yetta Flock, MD;  Location: [facility];  Service: Gastroenterology;  Laterality: N/A;  . HIPEC  procedure  07/29/2016   done at [place]  04/08/2016   IR FLUORO GUIDE PORT INSERTION RIGHT  04/08/2016 [name], MD [facility]  . IR GENERIC HISTORICAL  04/08/2016   IR US GUIDE VASC ACCESS RIGHT 04/08/2016 [name], MD [facility]  . LAPAROSCOPIC LYSIS OF ADHESIONS N/A 11/15/2015   Procedure: LAPAROSCOPIC LYSIS OF ADHESIONS;  Surgeon: [name], MD;  Location: [facility];  Service: General;  Laterality: N/A;  . LAPAROSCOPY N/A 02/20/2015   Procedure: LAPAROSCOPY DIAGNOSTIC;  Surgeon: [name], MD;  Location: [place];  Service: General;  Laterality: N/A;  . LAPAROSCOPY ABDOMEN DIAGNOSTIC  02/20/15   Converted to open - Sigmoid/rectal mass  . LAPAROTOMY N/A 02/20/2015   Procedure: EXPLORATORY LAPAROTOMY AND PARTIAL COLECTOMY;  Surgeon: [name], MD;  Location: [place];  Service: General;  Laterality: N/A;  . LOW ANTERIOR BOWEL RESECTION  02/20/15   Sigmoid/rectal mass  . OSTEOTOMY     reconstructed coccyx   . reconstructed nerve in ankle     . TONSILLECTOMY      FAMILY HISTORY: family history is not on file.  SOCIAL HISTORY:  reports that  has never smoked. she has never used smokeless tobacco. She reports that she drinks alcohol. She reports that she does not use drugs.  ALLERGIES: Bactrim [sulfamethoxazole-trimethoprim]; Oxaprozin; Penicillins; and Sulfonamide derivatives  MEDICATIONS:  Current Outpatient Medications  Medication Sig Dispense Refill  . acetaminophen (TYLENOL) 500 MG tablet Take 1,000 mg by mouth every 8 (eight) hours as needed for moderate pain or headache.     . fexofenadine (ALLEGRA) 180 MG tablet Take 180 mg by mouth daily as needed for allergies.     . fluticasone (FLONASE) 50 MCG/ACT nasal spray Place 1 spray into both nostrils 2 (two) times daily.     Marland Kitchen morphine (MSIR) 15 MG tablet Take 0.5 tablets (7.5 mg total) by mouth every 4 (four) hours as needed for severe pain. 30 tablet 0  . traMADol (ULTRAM) 50 MG tablet Take 1 tablet (50 mg total) by mouth every 6 (six) hours as needed. (Patient taking differently: Take 50 mg by  mouth every 6 (six) hours as needed for moderate pain. ) 90 tablet 0  . capecitabine (XELODA) 500 MG tablet Take 3 tablets (1,500 mg total) by mouth 2 (two) times daily after a meal. Take Monday through Saturday, take Sundays off. (Patient not taking: Reported on 04/23/2017) 156 tablet 0  . ciprofloxacin (CIPRO) 500 MG tablet Take 1 tablet (500 mg total) by mouth 2 (two) times daily. For 3 days (Patient not taking: Reported on 04/23/2017) 6 tablet 1  . megestrol (MEGACE) 20 MG tablet   4  . morphine (MS CONTIN) 30 MG 12 hr tablet Take 1 tablet (30 mg total) by mouth every 12 (twelve) hours. 30 tablet 0  . phenazopyridine (PYRIDIUM) 100 MG tablet Take 1 tablet (100 mg total) by mouth 3 (three) times daily as needed for pain. (Patient not taking: Reported on 04/23/2017) 30 tablet 1   No current facility-administered medications for this encounter.    Facility-Administered Medications Ordered in Other Encounters  Medication Dose Route Frequency Provider Last Rate Last Dose  . dextrose 5 % solution   Intravenous Continuous Volanda Napoleon, MD   Stopped at 04/09/16 1418  . heparin lock flush 100 unit/mL  500 Units Intracatheter Once PRN Volanda Napoleon, MD      . sodium chloride flush (NS) 0.9 % injection 10  mL  10 mL Intracatheter PRN Volanda Napoleon, MD      . sodium chloride flush (NS) 0.9 % injection 10 mL  10 mL Intracatheter PRN Volanda Napoleon, MD   10 mL at 10/31/16 1420    REVIEW OF SYSTEMS:  REVIEW OF SYSTEMS: A 10+ POINT REVIEW OF SYSTEMS WAS OBTAINED including neurology, dermatology, psychiatry, cardiac, respiratory, lymph, extremities, GI, GU, musculoskeletal, constitutional, reproductive, HEENT. All pertinent positives are noted in the HPI. All others are negative. Patient lies down for the entireevaluation in light of her pelvic pain. She has worsening pain with sitting or standing. Her short-acting pain medication is not adequately covering her pain level. She is having less drainage  from her mucous fistula at this time. She continues to have drainage from the rectum/anus area which is bloody.   PHYSICAL EXAM:  height is _0  (1.6 m) and weight is 147 lb 3.2 oz (66.8 kg). Her oral temperature is 98.2 F (36.8 C). Her blood pressure is 119/73 and her pulse is 92. Her oxygen saturation is 100%.   General: Alert and oriented, in no acute distress HEENT: Head is normocephalic. Extraocular movements are intact. Oropharynx is clear. Neck: Neck is supple, no palpable cervical or supraclavicular lymphadenopathy. Heart: Regular in rate and rhythm with no murmurs, rubs, or gallops. Chest: Clear to auscultation bilaterally, with no rhonchi, wheezes, or rales. Abdomen: Soft, nontender, nondistended, with no rigidity or guarding. Colostomy in place in the left abdominal region. Functioning well without signs of infection. Patient has a long vertical scar in the lower abdomen. Just inferior to this the patient has a mucous fistula. Extremities: No cyanosis or edema. Lymphatics: see Neck Exam, no palpable inguinal adenopathy Skin: No concerning lesions. Musculoskeletal: symmetric strength and muscle tone throughout. Neurologic: Cranial nerves II through XII are grossly intact. No obvious focalities. Speech is fluent. Coordination is intact. Psychiatric: Judgment and insight are intact. Affect is appropriate.     ECOG = 3  0 - Asymptomatic (Fully active, able to carry on all predisease activities without restriction)  1 - Symptomatic but completely ambulatory (Restricted in physically strenuous activity but ambulatory and able to carry out work of a light or sedentary nature. For example, light housework, office work)  2 - Symptomatic, <50% in bed during the day (Ambulatory and capable of all self care but unable to carry out any work activities. Up and about more than 50% of waking hours)  3 - Symptomatic, >50% in bed, but not bedbound (Capable of only limited self-care, confined  to bed or chair 50% or more of waking hours)  4 - Bedbound (Completely disabled. Cannot carry on any self-care. Totally confined to bed or chair)  5 - Death   Eustace Pen MM, Creech RH, Tormey DC, et al. [phone]). "Toxicity and response criteria of the [facility]". [place] Oncol. 5 (6): 649-55  LABORATORY DATA:  Lab Results  Component Value Date   WBC 10.2 04/11/2017   HGB 12.2 03/27/2017   HCT 39.3 04/11/2017   MCV 87.9 04/11/2017   PLT 328 04/11/2017   NEUTROABS 7.9 (H) 04/11/2017   Lab Results  Component Value Date   NA 143 04/11/2017   K 4.0 04/11/2017   CL 103 04/11/2017   CO2 27 04/11/2017   GLUCOSE 132 (H) 04/11/2017   CREATININE 0.9 03/27/2017   CALCIUM 9.5 04/11/2017      RADIOGRAPHY: Nm Pet Image Restag (ps) Skull Base To Thigh  Result Date: 04/10/2017  CLINICAL DATA:  Subsequent treatment strategy for colorectal carcinoma. EXAM: NUCLEAR MEDICINE PET SKULL BASE TO THIGH TECHNIQUE: 6.9 mCi F-18 FDG was injected intravenously. Full-ring PET imaging was performed from the skull base to thigh after the radiotracer. CT data was obtained and used for attenuation correction and anatomic localization. FASTING BLOOD GLUCOSE:  Value: 78 mg/dl COMPARISON:  CT 03/03/2017, PET-CT 06/10/2016 FINDINGS: NECK No hypermetabolic nodes in the neck. CHEST No hypermetabolic mediastinal or hilar nodes. No suspicious pulmonary nodules on the CT scan. ABDOMEN/PELVIS There is intense radiotracer activity at the nodular thickening associated with the rectal anastomosis. Activity is extremely intense with SUV max equal 21. Extending from the anastomotic recurrence is a fistulous track extending just superior to the bladder which exits the ventral abdominal wall midline at the level of the lower pelvis. This track is intensely hypermetabolic with equal metabolic activity to the rectal recurrence (SUV max equal 15.2). There are hypermetabolic lymph nodes in the perirectal fat. Lymph  node on the RIGHT anterior to the lower sacrum measures 7 mm with SUV max equal 3.4. Higher on the RIGHT adjacent to the uterus 9 mm lymph node with SUV max equal 4.3. Nodular thickening along ventral peritoneal surface at the the surgical scar in the mid abdomen just inferior to the umbilicus measures 4 mm (image 142, series 4) with mild metabolic activity (SUV max equal 3.5. Interval LEFT lower quadrant colostomy with mild physiologic activity. No hepatic metastasis identified. SKELETON No focal hypermetabolic activity to suggest skeletal metastasis. IMPRESSION: 1. Intense metabolic activity associated with the nodular carcinoma recurrence at the rectal anastomosis. 2. Local hypermetabolic metastatic adenopathy in the perirectal fat. 3. Intense radiotracer activity associated with the colo cutaneous fistula with differential including infection of fistulous tract, stool in the fistulous track, or tumor recurrence along the fistulous tract. Favor infection or tumor along the fistula tract. No clear evidence of colovesicular fistula to suggest urine escaping along this fistulous tract. 4. Concern for abdominal wall metastasis along the ventral midline surgical scar. Electronically Signed   By: Suzy Bouchard M.D.   On: 04/10/2017 09:20      IMPRESSION: recurrent colon cancer. The patient is quite symptomatic from her pelvic recurrence with significant pain. She would be a good candidate for an aggressive course of palliative radiation therapy directed at the pelvis area. I would also recommend radiosensitizing Xeloda chemotherapy along with her pelvic radiation treatments. I did discuss the PET scan findings with her surgeon Dr. Kaylyn Lim. We addressed the intense uptake noted along the colocutaneous fistula. Dr. Hassell Done felt this was likely not tumor. The patient does not appear to be infected at this area with no signs of fever or chills.  PLAN:patient will proceed with CT simulation early next week with  treatments to begin approximately 6 weeks postop. She will receive approximately 5 weeks of radiation therapy directed at the pelvic recurrence along with radiosensitizing chemotherapy. I am recommending intensity modulated radiation therapy to limit dose to surrounding small bowel. Patient has been given a prescription for MS Contin  in addition to her short-acting morphine given her significant pain level affecting her quality of life.    ------------------------------------------------  Blair Promise, PhD, MD

## 2017-04-23 NOTE — Telephone Encounter (Signed)
Oral Oncology Pharmacist Encounter  Received new prescription for Xeloda (capecitabine) for the treatment of Recurrent colon cancer in conjunction with radiation, planned duration until disease progression or unacceptable drug toxicity.  CBC/CMP from 04/11/17 assessed, no relevant lab abnormalities. Prescription dose and frequency assessed. Confirmed schedule and dose with Dr. Marin Olp, patient is to take her Xeloda Monday-Saturday, with Sunday off.   Current medication list in Epic reviewed, no DDIs with Xeloda identified.   Prescription has been e-scribed to the [facility] per her insurance requirement.  Oral Oncology Clinic will continue to follow for insurance authorization, copayment issues, initial counseling and start date.  Darl Pikes, PharmD, BCPS Hematology/Oncology Clinical Pharmacist [facility] [phone]  04/23/2017 8:43 AM

## 2017-04-24 ENCOUNTER — Telehealth: Payer: Self-pay | Admitting: Hematology & Oncology

## 2017-04-24 ENCOUNTER — Other Ambulatory Visit: Payer: Self-pay | Admitting: Oncology

## 2017-04-24 ENCOUNTER — Telehealth: Payer: Self-pay | Admitting: Oncology

## 2017-04-24 DIAGNOSIS — C187 Malignant neoplasm of sigmoid colon: Secondary | ICD-10-CM

## 2017-04-24 DIAGNOSIS — C772 Secondary and unspecified malignant neoplasm of intra-abdominal lymph nodes: Principal | ICD-10-CM

## 2017-04-24 NOTE — Telephone Encounter (Signed)
Oral Oncology Patient Advocate Encounter  Called [facility] to check on patients Xeloda. It has not been shipped yet. Need to get in touch with patient and set up shipment. They will also call and let me know it has been sent out.    [facility] Patient Advocate [phone] 04/24/2017 10:21 AM

## 2017-04-24 NOTE — Telephone Encounter (Signed)
Called patient and advised her of appointments on Monday, 04/28/17 for labs at 12 pm, IV start at 1:15 and CT SIM at 2 pm.  She verbalized understanding and agreement.

## 2017-04-25 DIAGNOSIS — C187 Malignant neoplasm of sigmoid colon: Secondary | ICD-10-CM | POA: Diagnosis not present

## 2017-04-28 ENCOUNTER — Ambulatory Visit
Admission: RE | Admit: 2017-04-28 | Discharge: 2017-04-28 | Disposition: A | Discharge status: Home / Self Care | Payer: BLUE CROSS/BLUE SHIELD | Source: Ambulatory Visit | Attending: Radiation Oncology | Admitting: Radiation Oncology

## 2017-04-28 ENCOUNTER — Ambulatory Visit
Admission: RE | Admit: 2017-04-28 | Discharge: 2017-04-28 | Disposition: A | Discharge status: Home / Self Care | Payer: BLUE CROSS/BLUE SHIELD | Source: Ambulatory Visit | Attending: Hematology & Oncology | Admitting: Hematology & Oncology

## 2017-04-28 ENCOUNTER — Inpatient Hospital Stay: Payer: BLUE CROSS/BLUE SHIELD | Attending: Radiation Oncology

## 2017-04-28 VITALS — BP 108/78 | HR 78 | Temp 97.9°F

## 2017-04-28 DIAGNOSIS — C187 Malignant neoplasm of sigmoid colon: Secondary | ICD-10-CM | POA: Diagnosis not present

## 2017-04-28 DIAGNOSIS — Y842 Radiological procedure and radiotherapy as the cause of abnormal reaction of the patient, or of later complication, without mention of misadventure at the time of the procedure: Secondary | ICD-10-CM | POA: Diagnosis not present

## 2017-04-28 DIAGNOSIS — C772 Secondary and unspecified malignant neoplasm of intra-abdominal lymph nodes: Principal | ICD-10-CM

## 2017-04-28 DIAGNOSIS — G893 Neoplasm related pain (acute) (chronic): Secondary | ICD-10-CM | POA: Diagnosis not present

## 2017-04-28 DIAGNOSIS — Z510 Encounter for antineoplastic radiation therapy: Secondary | ICD-10-CM | POA: Insufficient documentation

## 2017-04-28 DIAGNOSIS — Z85038 Personal history of other malignant neoplasm of large intestine: Secondary | ICD-10-CM | POA: Insufficient documentation

## 2017-04-28 DIAGNOSIS — Z933 Colostomy status: Secondary | ICD-10-CM | POA: Insufficient documentation

## 2017-04-28 DIAGNOSIS — Z79899 Other long term (current) drug therapy: Secondary | ICD-10-CM | POA: Insufficient documentation

## 2017-04-28 DIAGNOSIS — Z79891 Long term (current) use of opiate analgesic: Secondary | ICD-10-CM | POA: Diagnosis not present

## 2017-04-28 DIAGNOSIS — Z9889 Other specified postprocedural states: Secondary | ICD-10-CM | POA: Insufficient documentation

## 2017-04-28 DIAGNOSIS — D509 Iron deficiency anemia, unspecified: Secondary | ICD-10-CM | POA: Diagnosis present

## 2017-04-28 DIAGNOSIS — Z880 Allergy status to penicillin: Secondary | ICD-10-CM | POA: Diagnosis not present

## 2017-04-28 DIAGNOSIS — C189 Malignant neoplasm of colon, unspecified: Secondary | ICD-10-CM | POA: Insufficient documentation

## 2017-04-28 DIAGNOSIS — Z882 Allergy status to sulfonamides status: Secondary | ICD-10-CM | POA: Diagnosis not present

## 2017-04-28 DIAGNOSIS — Z888 Allergy status to other drugs, medicaments and biological substances status: Secondary | ICD-10-CM | POA: Diagnosis not present

## 2017-04-28 DIAGNOSIS — Z9221 Personal history of antineoplastic chemotherapy: Secondary | ICD-10-CM | POA: Insufficient documentation

## 2017-04-28 LAB — BASIC METABOLIC PANEL - CANCER CENTER ONLY
Anion gap: 9 (ref 3–11)
BUN: 13 mg/dL (ref 7–26)
CO2: 28 mmol/L (ref 22–29)
CREATININE: 0.67 mg/dL (ref 0.60–1.10)
Calcium: 9.6 mg/dL (ref 8.4–10.4)
Chloride: 105 mmol/L (ref 98–109)
GFR, Estimated: >60 mL/min (ref >60)
Glucose, Bld: 86 mg/dL (ref 70–140)
Potassium: 4 mmol/L (ref 3.5–5.1)
SODIUM: 142 mmol/L (ref 136–145)

## 2017-04-28 MED ORDER — SODIUM CHLORIDE 0.9 % IJ SOLN
10.0000 mL | Freq: Once | INTRAMUSCULAR | Status: AC
  Administered 2017-04-28: 10 mL via INTRAVENOUS

## 2017-04-28 MED ORDER — HEPARIN SOD (PORK) LOCK FLUSH 100 UNIT/ML IV SOLN
500.0000 [IU] | Freq: Once | INTRAVENOUS | Status: AC
  Administered 2017-04-28: 500 [IU] via INTRAVENOUS

## 2017-04-28 NOTE — Progress Notes (Addendum)
Has armband been applied?  Yes.    Does patient have an allergy to IV contrast dye?: No.   Has patient ever received premedication for IV contrast dye?: No.   Does patient take metformin?: No.  If patient does take metformin when was the last dose: n/a  Date of lab work: April 28, 2017 BUN: 13 CR: 0.67   IV site: right chest porta cath - accessed in Medical Oncology.  Has IV site been added to flowsheet?  Yes.    There were no vitals taken for this visit.

## 2017-05-01 ENCOUNTER — Other Ambulatory Visit: Payer: Self-pay | Admitting: *Deleted

## 2017-05-01 DIAGNOSIS — Z79891 Long term (current) use of opiate analgesic: Secondary | ICD-10-CM | POA: Diagnosis not present

## 2017-05-01 DIAGNOSIS — Z882 Allergy status to sulfonamides status: Secondary | ICD-10-CM | POA: Diagnosis not present

## 2017-05-01 DIAGNOSIS — Z880 Allergy status to penicillin: Secondary | ICD-10-CM | POA: Diagnosis not present

## 2017-05-01 DIAGNOSIS — Z9221 Personal history of antineoplastic chemotherapy: Secondary | ICD-10-CM | POA: Diagnosis not present

## 2017-05-01 DIAGNOSIS — Z85038 Personal history of other malignant neoplasm of large intestine: Secondary | ICD-10-CM | POA: Diagnosis not present

## 2017-05-01 DIAGNOSIS — Z510 Encounter for antineoplastic radiation therapy: Secondary | ICD-10-CM | POA: Diagnosis not present

## 2017-05-01 DIAGNOSIS — Z79899 Other long term (current) drug therapy: Secondary | ICD-10-CM | POA: Diagnosis not present

## 2017-05-01 DIAGNOSIS — Z9889 Other specified postprocedural states: Secondary | ICD-10-CM | POA: Diagnosis not present

## 2017-05-01 DIAGNOSIS — Z888 Allergy status to other drugs, medicaments and biological substances status: Secondary | ICD-10-CM | POA: Diagnosis not present

## 2017-05-01 DIAGNOSIS — G893 Neoplasm related pain (acute) (chronic): Secondary | ICD-10-CM | POA: Diagnosis not present

## 2017-05-01 DIAGNOSIS — C187 Malignant neoplasm of sigmoid colon: Secondary | ICD-10-CM | POA: Diagnosis not present

## 2017-05-01 MED ORDER — MORPHINE SULFATE 15 MG PO TABS: 7.5000 mg | ORAL_TABLET | ORAL | 0 refills | Status: DC | PRN

## 2017-05-01 NOTE — Progress Notes (Signed)
  Radiation Oncology         [phone] ________________________________  Name: Carla Mills MRN: [id number]  Date: 04/28/2017  DOB: [birth year]  SIMULATION AND TREATMENT PLANNING NOTE    ICD-10-CM   1. 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  Identity was confirmed.  All relevant records and images related to the planned course of therapy were reviewed.  The patient freely provided informed written consent to proceed with treatment after reviewing the details related to the planned course of therapy. The consent form was witnessed and verified by the simulation staff.  Then, the patient was set-up in a stable reproducible  supine position for radiation therapy.  CT images were obtained.  Surface markings were placed.  The CT images were loaded into the planning software.  Then the target and avoidance structures were contoured.  Treatment planning then occurred.  The radiation prescription was entered and confirmed.  Then, I designed and supervised the construction of a total of 5 medically necessary complex treatment devices.  I have requested : 3D Simulation  I have requested a DVH of the following structures: GTV, bladder, rectum, bowel fem head/neck.  I have ordered:dose calc.  PLAN:  The patient will receive 45 Gy in 25 fractions. Directed at the area of recurrence in the lower pelvis region. Patient will also receive radiosensitizing chemotherapy.  Special Treatment Procedure Note: The patient will be receiving radiosensitizing chemotherapy. Given the potential of increased toxicities related to combined therapy and the necessity for close monitoring of the patient and blood work, this constitutes a special treatment procedure.  -----------------------------------  Blair Promise, PhD, MD

## 2017-05-02 ENCOUNTER — Encounter: Payer: Self-pay | Admitting: Hematology & Oncology

## 2017-05-05 ENCOUNTER — Ambulatory Visit
Admission: RE | Admit: 2017-05-05 | Discharge: 2017-05-05 | Disposition: A | Discharge status: Home / Self Care | Payer: BLUE CROSS/BLUE SHIELD | Source: Ambulatory Visit | Attending: Radiation Oncology | Admitting: Radiation Oncology

## 2017-05-05 DIAGNOSIS — G893 Neoplasm related pain (acute) (chronic): Secondary | ICD-10-CM | POA: Diagnosis not present

## 2017-05-05 DIAGNOSIS — Z888 Allergy status to other drugs, medicaments and biological substances status: Secondary | ICD-10-CM | POA: Diagnosis not present

## 2017-05-05 DIAGNOSIS — Z882 Allergy status to sulfonamides status: Secondary | ICD-10-CM | POA: Diagnosis not present

## 2017-05-05 DIAGNOSIS — Z79891 Long term (current) use of opiate analgesic: Secondary | ICD-10-CM | POA: Diagnosis not present

## 2017-05-05 DIAGNOSIS — Z79899 Other long term (current) drug therapy: Secondary | ICD-10-CM | POA: Diagnosis not present

## 2017-05-05 DIAGNOSIS — Z880 Allergy status to penicillin: Secondary | ICD-10-CM | POA: Diagnosis not present

## 2017-05-05 DIAGNOSIS — Z510 Encounter for antineoplastic radiation therapy: Secondary | ICD-10-CM | POA: Diagnosis not present

## 2017-05-05 DIAGNOSIS — Z9889 Other specified postprocedural states: Secondary | ICD-10-CM | POA: Diagnosis not present

## 2017-05-05 DIAGNOSIS — Z85038 Personal history of other malignant neoplasm of large intestine: Secondary | ICD-10-CM | POA: Diagnosis not present

## 2017-05-05 DIAGNOSIS — Z9221 Personal history of antineoplastic chemotherapy: Secondary | ICD-10-CM | POA: Diagnosis not present

## 2017-05-05 DIAGNOSIS — C187 Malignant neoplasm of sigmoid colon: Secondary | ICD-10-CM | POA: Diagnosis not present

## 2017-05-06 ENCOUNTER — Ambulatory Visit
Admission: RE | Admit: 2017-05-06 | Discharge: 2017-05-06 | Disposition: A | Discharge status: Home / Self Care | Payer: BLUE CROSS/BLUE SHIELD | Source: Ambulatory Visit | Attending: Radiation Oncology | Admitting: Radiation Oncology

## 2017-05-06 ENCOUNTER — Other Ambulatory Visit: Payer: Self-pay | Admitting: Radiation Oncology

## 2017-05-06 DIAGNOSIS — G893 Neoplasm related pain (acute) (chronic): Secondary | ICD-10-CM | POA: Diagnosis not present

## 2017-05-06 DIAGNOSIS — Z9221 Personal history of antineoplastic chemotherapy: Secondary | ICD-10-CM | POA: Diagnosis not present

## 2017-05-06 DIAGNOSIS — C187 Malignant neoplasm of sigmoid colon: Secondary | ICD-10-CM | POA: Diagnosis not present

## 2017-05-06 DIAGNOSIS — Z9889 Other specified postprocedural states: Secondary | ICD-10-CM | POA: Diagnosis not present

## 2017-05-06 DIAGNOSIS — Z880 Allergy status to penicillin: Secondary | ICD-10-CM | POA: Diagnosis not present

## 2017-05-06 DIAGNOSIS — Z79899 Other long term (current) drug therapy: Secondary | ICD-10-CM | POA: Diagnosis not present

## 2017-05-06 DIAGNOSIS — Z510 Encounter for antineoplastic radiation therapy: Secondary | ICD-10-CM | POA: Diagnosis not present

## 2017-05-06 DIAGNOSIS — Z79891 Long term (current) use of opiate analgesic: Secondary | ICD-10-CM | POA: Diagnosis not present

## 2017-05-06 DIAGNOSIS — Z882 Allergy status to sulfonamides status: Secondary | ICD-10-CM | POA: Diagnosis not present

## 2017-05-06 DIAGNOSIS — Z888 Allergy status to other drugs, medicaments and biological substances status: Secondary | ICD-10-CM | POA: Diagnosis not present

## 2017-05-06 DIAGNOSIS — Z85038 Personal history of other malignant neoplasm of large intestine: Secondary | ICD-10-CM | POA: Diagnosis not present

## 2017-05-06 MED ORDER — MORPHINE SULFATE ER 30 MG PO TBCR: 30.0000 mg | EXTENDED_RELEASE_TABLET | Freq: Two times a day (BID) | ORAL | 0 refills | Status: DC

## 2017-05-06 MED FILL — MORPHINE SULF ER 30 MG TAB: 30 | 15 days supply | Qty: 30 | Fill #0

## 2017-05-07 ENCOUNTER — Ambulatory Visit
Admission: RE | Admit: 2017-05-07 | Discharge: 2017-05-07 | Disposition: A | Discharge status: Home / Self Care | Payer: BLUE CROSS/BLUE SHIELD | Source: Ambulatory Visit | Attending: Radiation Oncology | Admitting: Radiation Oncology

## 2017-05-07 DIAGNOSIS — Z510 Encounter for antineoplastic radiation therapy: Secondary | ICD-10-CM | POA: Diagnosis not present

## 2017-05-07 DIAGNOSIS — Z85038 Personal history of other malignant neoplasm of large intestine: Secondary | ICD-10-CM | POA: Diagnosis not present

## 2017-05-07 DIAGNOSIS — Z79899 Other long term (current) drug therapy: Secondary | ICD-10-CM | POA: Diagnosis not present

## 2017-05-07 DIAGNOSIS — Z880 Allergy status to penicillin: Secondary | ICD-10-CM | POA: Diagnosis not present

## 2017-05-07 DIAGNOSIS — Z9889 Other specified postprocedural states: Secondary | ICD-10-CM | POA: Diagnosis not present

## 2017-05-07 DIAGNOSIS — G893 Neoplasm related pain (acute) (chronic): Secondary | ICD-10-CM | POA: Diagnosis not present

## 2017-05-07 DIAGNOSIS — Z9221 Personal history of antineoplastic chemotherapy: Secondary | ICD-10-CM | POA: Diagnosis not present

## 2017-05-07 DIAGNOSIS — Z888 Allergy status to other drugs, medicaments and biological substances status: Secondary | ICD-10-CM | POA: Diagnosis not present

## 2017-05-07 DIAGNOSIS — Z79891 Long term (current) use of opiate analgesic: Secondary | ICD-10-CM | POA: Diagnosis not present

## 2017-05-07 DIAGNOSIS — Z882 Allergy status to sulfonamides status: Secondary | ICD-10-CM | POA: Diagnosis not present

## 2017-05-07 DIAGNOSIS — C187 Malignant neoplasm of sigmoid colon: Secondary | ICD-10-CM | POA: Diagnosis not present

## 2017-05-08 ENCOUNTER — Ambulatory Visit
Admission: RE | Admit: 2017-05-08 | Discharge: 2017-05-08 | Disposition: A | Discharge status: Home / Self Care | Payer: BLUE CROSS/BLUE SHIELD | Source: Ambulatory Visit | Attending: Radiation Oncology | Admitting: Radiation Oncology

## 2017-05-08 ENCOUNTER — Other Ambulatory Visit: Payer: Self-pay | Admitting: Hematology & Oncology

## 2017-05-08 DIAGNOSIS — Z85038 Personal history of other malignant neoplasm of large intestine: Secondary | ICD-10-CM | POA: Diagnosis not present

## 2017-05-08 DIAGNOSIS — Z79891 Long term (current) use of opiate analgesic: Secondary | ICD-10-CM | POA: Diagnosis not present

## 2017-05-08 DIAGNOSIS — Z882 Allergy status to sulfonamides status: Secondary | ICD-10-CM | POA: Diagnosis not present

## 2017-05-08 DIAGNOSIS — Z888 Allergy status to other drugs, medicaments and biological substances status: Secondary | ICD-10-CM | POA: Diagnosis not present

## 2017-05-08 DIAGNOSIS — Z9889 Other specified postprocedural states: Secondary | ICD-10-CM | POA: Diagnosis not present

## 2017-05-08 DIAGNOSIS — Z510 Encounter for antineoplastic radiation therapy: Secondary | ICD-10-CM | POA: Diagnosis not present

## 2017-05-08 DIAGNOSIS — G893 Neoplasm related pain (acute) (chronic): Secondary | ICD-10-CM | POA: Diagnosis not present

## 2017-05-08 DIAGNOSIS — C187 Malignant neoplasm of sigmoid colon: Secondary | ICD-10-CM

## 2017-05-08 DIAGNOSIS — C772 Secondary and unspecified malignant neoplasm of intra-abdominal lymph nodes: Principal | ICD-10-CM

## 2017-05-08 DIAGNOSIS — Z880 Allergy status to penicillin: Secondary | ICD-10-CM | POA: Diagnosis not present

## 2017-05-08 DIAGNOSIS — Z9221 Personal history of antineoplastic chemotherapy: Secondary | ICD-10-CM | POA: Diagnosis not present

## 2017-05-08 DIAGNOSIS — Z79899 Other long term (current) drug therapy: Secondary | ICD-10-CM | POA: Diagnosis not present

## 2017-05-09 ENCOUNTER — Ambulatory Visit
Admission: RE | Admit: 2017-05-09 | Discharge: 2017-05-09 | Disposition: A | Discharge status: Home / Self Care | Payer: BLUE CROSS/BLUE SHIELD | Source: Ambulatory Visit | Attending: Radiation Oncology | Admitting: Radiation Oncology

## 2017-05-09 DIAGNOSIS — Z882 Allergy status to sulfonamides status: Secondary | ICD-10-CM | POA: Diagnosis not present

## 2017-05-09 DIAGNOSIS — Z9221 Personal history of antineoplastic chemotherapy: Secondary | ICD-10-CM | POA: Diagnosis not present

## 2017-05-09 DIAGNOSIS — Z888 Allergy status to other drugs, medicaments and biological substances status: Secondary | ICD-10-CM | POA: Diagnosis not present

## 2017-05-09 DIAGNOSIS — Z85038 Personal history of other malignant neoplasm of large intestine: Secondary | ICD-10-CM | POA: Diagnosis not present

## 2017-05-09 DIAGNOSIS — Z9889 Other specified postprocedural states: Secondary | ICD-10-CM | POA: Diagnosis not present

## 2017-05-09 DIAGNOSIS — Z79899 Other long term (current) drug therapy: Secondary | ICD-10-CM | POA: Diagnosis not present

## 2017-05-09 DIAGNOSIS — Z510 Encounter for antineoplastic radiation therapy: Secondary | ICD-10-CM | POA: Diagnosis not present

## 2017-05-09 DIAGNOSIS — G893 Neoplasm related pain (acute) (chronic): Secondary | ICD-10-CM | POA: Diagnosis not present

## 2017-05-09 DIAGNOSIS — C187 Malignant neoplasm of sigmoid colon: Secondary | ICD-10-CM | POA: Diagnosis not present

## 2017-05-09 DIAGNOSIS — Z79891 Long term (current) use of opiate analgesic: Secondary | ICD-10-CM | POA: Diagnosis not present

## 2017-05-09 DIAGNOSIS — Z880 Allergy status to penicillin: Secondary | ICD-10-CM | POA: Diagnosis not present

## 2017-05-12 ENCOUNTER — Ambulatory Visit
Admission: RE | Admit: 2017-05-12 | Discharge: 2017-05-12 | Disposition: A | Discharge status: Home / Self Care | Payer: BLUE CROSS/BLUE SHIELD | Source: Ambulatory Visit | Attending: Radiation Oncology | Admitting: Radiation Oncology

## 2017-05-12 DIAGNOSIS — Z9221 Personal history of antineoplastic chemotherapy: Secondary | ICD-10-CM | POA: Diagnosis not present

## 2017-05-12 DIAGNOSIS — C187 Malignant neoplasm of sigmoid colon: Secondary | ICD-10-CM | POA: Diagnosis not present

## 2017-05-12 DIAGNOSIS — Z880 Allergy status to penicillin: Secondary | ICD-10-CM | POA: Diagnosis not present

## 2017-05-12 DIAGNOSIS — Z85038 Personal history of other malignant neoplasm of large intestine: Secondary | ICD-10-CM | POA: Diagnosis not present

## 2017-05-12 DIAGNOSIS — Z510 Encounter for antineoplastic radiation therapy: Secondary | ICD-10-CM | POA: Diagnosis not present

## 2017-05-12 DIAGNOSIS — G893 Neoplasm related pain (acute) (chronic): Secondary | ICD-10-CM | POA: Diagnosis not present

## 2017-05-12 DIAGNOSIS — Z882 Allergy status to sulfonamides status: Secondary | ICD-10-CM | POA: Diagnosis not present

## 2017-05-12 DIAGNOSIS — Z9889 Other specified postprocedural states: Secondary | ICD-10-CM | POA: Diagnosis not present

## 2017-05-12 DIAGNOSIS — Z79891 Long term (current) use of opiate analgesic: Secondary | ICD-10-CM | POA: Diagnosis not present

## 2017-05-12 DIAGNOSIS — Z79899 Other long term (current) drug therapy: Secondary | ICD-10-CM | POA: Diagnosis not present

## 2017-05-12 DIAGNOSIS — Z888 Allergy status to other drugs, medicaments and biological substances status: Secondary | ICD-10-CM | POA: Diagnosis not present

## 2017-05-13 ENCOUNTER — Ambulatory Visit
Admission: RE | Admit: 2017-05-13 | Discharge: 2017-05-13 | Disposition: A | Discharge status: Home / Self Care | Payer: BLUE CROSS/BLUE SHIELD | Source: Ambulatory Visit | Attending: Radiation Oncology | Admitting: Radiation Oncology

## 2017-05-13 DIAGNOSIS — Z79891 Long term (current) use of opiate analgesic: Secondary | ICD-10-CM | POA: Diagnosis not present

## 2017-05-13 DIAGNOSIS — Z85038 Personal history of other malignant neoplasm of large intestine: Secondary | ICD-10-CM | POA: Diagnosis not present

## 2017-05-13 DIAGNOSIS — Z510 Encounter for antineoplastic radiation therapy: Secondary | ICD-10-CM | POA: Diagnosis not present

## 2017-05-13 DIAGNOSIS — Z882 Allergy status to sulfonamides status: Secondary | ICD-10-CM | POA: Diagnosis not present

## 2017-05-13 DIAGNOSIS — Z888 Allergy status to other drugs, medicaments and biological substances status: Secondary | ICD-10-CM | POA: Diagnosis not present

## 2017-05-13 DIAGNOSIS — G893 Neoplasm related pain (acute) (chronic): Secondary | ICD-10-CM | POA: Diagnosis not present

## 2017-05-13 DIAGNOSIS — Z9221 Personal history of antineoplastic chemotherapy: Secondary | ICD-10-CM | POA: Diagnosis not present

## 2017-05-13 DIAGNOSIS — Z79899 Other long term (current) drug therapy: Secondary | ICD-10-CM | POA: Diagnosis not present

## 2017-05-13 DIAGNOSIS — Z9889 Other specified postprocedural states: Secondary | ICD-10-CM | POA: Diagnosis not present

## 2017-05-13 DIAGNOSIS — Z880 Allergy status to penicillin: Secondary | ICD-10-CM | POA: Diagnosis not present

## 2017-05-13 DIAGNOSIS — C187 Malignant neoplasm of sigmoid colon: Secondary | ICD-10-CM | POA: Diagnosis not present

## 2017-05-14 ENCOUNTER — Ambulatory Visit
Admission: RE | Admit: 2017-05-14 | Discharge: 2017-05-14 | Disposition: A | Discharge status: Home / Self Care | Payer: BLUE CROSS/BLUE SHIELD | Source: Ambulatory Visit | Attending: Radiation Oncology | Admitting: Radiation Oncology

## 2017-05-14 DIAGNOSIS — Z9889 Other specified postprocedural states: Secondary | ICD-10-CM | POA: Diagnosis not present

## 2017-05-14 DIAGNOSIS — C187 Malignant neoplasm of sigmoid colon: Secondary | ICD-10-CM | POA: Diagnosis not present

## 2017-05-14 DIAGNOSIS — G893 Neoplasm related pain (acute) (chronic): Secondary | ICD-10-CM | POA: Diagnosis not present

## 2017-05-14 DIAGNOSIS — Z888 Allergy status to other drugs, medicaments and biological substances status: Secondary | ICD-10-CM | POA: Diagnosis not present

## 2017-05-14 DIAGNOSIS — Z882 Allergy status to sulfonamides status: Secondary | ICD-10-CM | POA: Diagnosis not present

## 2017-05-14 DIAGNOSIS — Z880 Allergy status to penicillin: Secondary | ICD-10-CM | POA: Diagnosis not present

## 2017-05-14 DIAGNOSIS — Z9221 Personal history of antineoplastic chemotherapy: Secondary | ICD-10-CM | POA: Diagnosis not present

## 2017-05-14 DIAGNOSIS — Z79891 Long term (current) use of opiate analgesic: Secondary | ICD-10-CM | POA: Diagnosis not present

## 2017-05-14 DIAGNOSIS — Z79899 Other long term (current) drug therapy: Secondary | ICD-10-CM | POA: Diagnosis not present

## 2017-05-14 DIAGNOSIS — Z510 Encounter for antineoplastic radiation therapy: Secondary | ICD-10-CM | POA: Diagnosis not present

## 2017-05-14 DIAGNOSIS — Z85038 Personal history of other malignant neoplasm of large intestine: Secondary | ICD-10-CM | POA: Diagnosis not present

## 2017-05-15 ENCOUNTER — Ambulatory Visit
Admission: RE | Admit: 2017-05-15 | Discharge: 2017-05-15 | Disposition: A | Discharge status: Home / Self Care | Payer: BLUE CROSS/BLUE SHIELD | Source: Ambulatory Visit | Attending: Radiation Oncology | Admitting: Radiation Oncology

## 2017-05-15 DIAGNOSIS — Z888 Allergy status to other drugs, medicaments and biological substances status: Secondary | ICD-10-CM | POA: Diagnosis not present

## 2017-05-15 DIAGNOSIS — Z79899 Other long term (current) drug therapy: Secondary | ICD-10-CM | POA: Diagnosis not present

## 2017-05-15 DIAGNOSIS — Z880 Allergy status to penicillin: Secondary | ICD-10-CM | POA: Diagnosis not present

## 2017-05-15 DIAGNOSIS — G893 Neoplasm related pain (acute) (chronic): Secondary | ICD-10-CM | POA: Diagnosis not present

## 2017-05-15 DIAGNOSIS — Z882 Allergy status to sulfonamides status: Secondary | ICD-10-CM | POA: Diagnosis not present

## 2017-05-15 DIAGNOSIS — Z9889 Other specified postprocedural states: Secondary | ICD-10-CM | POA: Diagnosis not present

## 2017-05-15 DIAGNOSIS — Z79891 Long term (current) use of opiate analgesic: Secondary | ICD-10-CM | POA: Diagnosis not present

## 2017-05-15 DIAGNOSIS — Z510 Encounter for antineoplastic radiation therapy: Secondary | ICD-10-CM | POA: Diagnosis not present

## 2017-05-15 DIAGNOSIS — Z85038 Personal history of other malignant neoplasm of large intestine: Secondary | ICD-10-CM | POA: Diagnosis not present

## 2017-05-15 DIAGNOSIS — Z9221 Personal history of antineoplastic chemotherapy: Secondary | ICD-10-CM | POA: Diagnosis not present

## 2017-05-15 DIAGNOSIS — C187 Malignant neoplasm of sigmoid colon: Secondary | ICD-10-CM | POA: Diagnosis not present

## 2017-05-16 ENCOUNTER — Ambulatory Visit
Admission: RE | Admit: 2017-05-16 | Discharge: 2017-05-16 | Disposition: A | Discharge status: Home / Self Care | Payer: BLUE CROSS/BLUE SHIELD | Source: Ambulatory Visit | Attending: Radiation Oncology | Admitting: Radiation Oncology

## 2017-05-16 DIAGNOSIS — Z79899 Other long term (current) drug therapy: Secondary | ICD-10-CM | POA: Diagnosis not present

## 2017-05-16 DIAGNOSIS — Z888 Allergy status to other drugs, medicaments and biological substances status: Secondary | ICD-10-CM | POA: Diagnosis not present

## 2017-05-16 DIAGNOSIS — Z9889 Other specified postprocedural states: Secondary | ICD-10-CM | POA: Diagnosis not present

## 2017-05-16 DIAGNOSIS — Z510 Encounter for antineoplastic radiation therapy: Secondary | ICD-10-CM | POA: Diagnosis not present

## 2017-05-16 DIAGNOSIS — G893 Neoplasm related pain (acute) (chronic): Secondary | ICD-10-CM | POA: Diagnosis not present

## 2017-05-16 DIAGNOSIS — Z85038 Personal history of other malignant neoplasm of large intestine: Secondary | ICD-10-CM | POA: Diagnosis not present

## 2017-05-16 DIAGNOSIS — Z880 Allergy status to penicillin: Secondary | ICD-10-CM | POA: Diagnosis not present

## 2017-05-16 DIAGNOSIS — Z9221 Personal history of antineoplastic chemotherapy: Secondary | ICD-10-CM | POA: Diagnosis not present

## 2017-05-16 DIAGNOSIS — Z882 Allergy status to sulfonamides status: Secondary | ICD-10-CM | POA: Diagnosis not present

## 2017-05-16 DIAGNOSIS — C187 Malignant neoplasm of sigmoid colon: Secondary | ICD-10-CM | POA: Diagnosis not present

## 2017-05-16 DIAGNOSIS — Z79891 Long term (current) use of opiate analgesic: Secondary | ICD-10-CM | POA: Diagnosis not present

## 2017-05-19 ENCOUNTER — Ambulatory Visit
Admission: RE | Admit: 2017-05-19 | Discharge: 2017-05-19 | Disposition: A | Discharge status: Home / Self Care | Payer: BLUE CROSS/BLUE SHIELD | Source: Ambulatory Visit | Attending: Radiation Oncology | Admitting: Radiation Oncology

## 2017-05-19 ENCOUNTER — Ambulatory Visit (HOSPITAL_BASED_OUTPATIENT_CLINIC_OR_DEPARTMENT_OTHER)
Admission: RE | Admit: 2017-05-19 | Discharge: 2017-05-19 | Disposition: A | Discharge status: Home / Self Care | Payer: BLUE CROSS/BLUE SHIELD | Source: Ambulatory Visit | Attending: Internal Medicine | Admitting: Internal Medicine

## 2017-05-19 DIAGNOSIS — C189 Malignant neoplasm of colon, unspecified: Secondary | ICD-10-CM

## 2017-05-19 DIAGNOSIS — Z888 Allergy status to other drugs, medicaments and biological substances status: Secondary | ICD-10-CM | POA: Diagnosis not present

## 2017-05-19 DIAGNOSIS — Z85038 Personal history of other malignant neoplasm of large intestine: Secondary | ICD-10-CM | POA: Diagnosis not present

## 2017-05-19 DIAGNOSIS — Z9221 Personal history of antineoplastic chemotherapy: Secondary | ICD-10-CM | POA: Diagnosis not present

## 2017-05-19 DIAGNOSIS — G893 Neoplasm related pain (acute) (chronic): Secondary | ICD-10-CM

## 2017-05-19 DIAGNOSIS — Z7189 Other specified counseling: Secondary | ICD-10-CM

## 2017-05-19 DIAGNOSIS — Z882 Allergy status to sulfonamides status: Secondary | ICD-10-CM | POA: Diagnosis not present

## 2017-05-19 DIAGNOSIS — Z515 Encounter for palliative care: Secondary | ICD-10-CM

## 2017-05-19 DIAGNOSIS — C187 Malignant neoplasm of sigmoid colon: Secondary | ICD-10-CM | POA: Diagnosis not present

## 2017-05-19 DIAGNOSIS — Z9889 Other specified postprocedural states: Secondary | ICD-10-CM | POA: Diagnosis not present

## 2017-05-19 DIAGNOSIS — Z79899 Other long term (current) drug therapy: Secondary | ICD-10-CM | POA: Diagnosis not present

## 2017-05-19 DIAGNOSIS — Z510 Encounter for antineoplastic radiation therapy: Secondary | ICD-10-CM | POA: Diagnosis not present

## 2017-05-19 DIAGNOSIS — Z880 Allergy status to penicillin: Secondary | ICD-10-CM | POA: Diagnosis not present

## 2017-05-19 DIAGNOSIS — Z79891 Long term (current) use of opiate analgesic: Secondary | ICD-10-CM | POA: Diagnosis not present

## 2017-05-20 ENCOUNTER — Ambulatory Visit
Admission: RE | Admit: 2017-05-20 | Discharge: 2017-05-20 | Disposition: A | Discharge status: Home / Self Care | Payer: BLUE CROSS/BLUE SHIELD | Source: Ambulatory Visit | Attending: Radiation Oncology | Admitting: Radiation Oncology

## 2017-05-20 DIAGNOSIS — C187 Malignant neoplasm of sigmoid colon: Secondary | ICD-10-CM | POA: Diagnosis not present

## 2017-05-20 DIAGNOSIS — Z9221 Personal history of antineoplastic chemotherapy: Secondary | ICD-10-CM | POA: Diagnosis not present

## 2017-05-20 DIAGNOSIS — Z9889 Other specified postprocedural states: Secondary | ICD-10-CM | POA: Diagnosis not present

## 2017-05-20 DIAGNOSIS — Z85038 Personal history of other malignant neoplasm of large intestine: Secondary | ICD-10-CM | POA: Diagnosis not present

## 2017-05-20 DIAGNOSIS — Z79891 Long term (current) use of opiate analgesic: Secondary | ICD-10-CM | POA: Diagnosis not present

## 2017-05-20 DIAGNOSIS — Z79899 Other long term (current) drug therapy: Secondary | ICD-10-CM | POA: Diagnosis not present

## 2017-05-20 DIAGNOSIS — Z882 Allergy status to sulfonamides status: Secondary | ICD-10-CM | POA: Diagnosis not present

## 2017-05-20 DIAGNOSIS — Z510 Encounter for antineoplastic radiation therapy: Secondary | ICD-10-CM | POA: Diagnosis not present

## 2017-05-20 DIAGNOSIS — Z880 Allergy status to penicillin: Secondary | ICD-10-CM | POA: Diagnosis not present

## 2017-05-20 DIAGNOSIS — Z888 Allergy status to other drugs, medicaments and biological substances status: Secondary | ICD-10-CM | POA: Diagnosis not present

## 2017-05-20 DIAGNOSIS — G893 Neoplasm related pain (acute) (chronic): Secondary | ICD-10-CM | POA: Diagnosis not present

## 2017-05-20 NOTE — Consult Note (Signed)
Consultation Note Date: 05/20/2017   Patient Name: Carla Mills  DOB: 08-01-1970  MRN: [id number]  Age / Sex: 47 y.o., female  PCP: Patient, No Pcp Per Referring Physician: Knox Royalty, NP  Reason for Consultation: Introduction to palliative medicine as part of a holistic treatment plan  HPI/Patient Profile: 47 y.o. female  with past medical history of of metastatic colorectal cancer first diagnosed in 2016.  CT scan performed on 02/10/2015 showed rectosigmoid with a contained abscess and small regional nodes.  Small indeterminate liver lesions were confirmed to be cysts on abdominal MRI.  She underwent flex sigmoid that showed a mass at the rectosigmoid junction.  This polyp was biopsied and showed to have high-grade dysplasia. He underwent an LAR with a Hartman's pouch that confirmed a moderately differentiated adenocarcinoma with perforation.   She was evaluated by medical oncology for adjuvant chemotherapy and was treated with Xeloda based on an Oncotype DX score of 37. This was completed in June 2017. She underwent ostomy takedown on 11/15/15. She then underwent a surveillance CT scan on 02/20/16 that showed a new 5 x 3.7cm presacral mass superior to the anastomosis with multiple soft tissue implants. A biopsy of a representative omental implant (03/14/16) confirmed metastatic adenocarcinoma.   She was treated with FOLFOXIRI and received 6 cycles with a marked improvement in tumor burden. Therefore, she underwent debulking at [facility] with HIPEC; this included a BSO and omentectomy (uterus left). Pathology showed no residual cancer in the omentum and minute foci of residual adenocarcinoma in both ovaries. She completed several additional cycles of FOLFOXIRI in August 2018. She has no residual neuropathy.   More recently, she developed the return of rectal pain with bleeding. CT scan (03/03/17) showed narrowing of  the rectosigmoid at the prior surgical site. Flex sig showed a non-transverable severe stenosis in the rectum; biopsies showed adenocarcinoma. An ex-lap was performed on 03/07/17 with diverting colostomy and mucous fistula placement. The operative report described a shelf of cancer that was fixed and non-mobile extending anteriorly that was felt to be unresectable. No involvement of bladder was mentioned. Post-operative PET scan (04/12/17) showed FDG-avidity at the rectal anastomosis as well as several peri-rectal lymph nodes. There was intense avidity of the fistula track extending to the skin. No evidence of visceral metastases.    Her most notable symptom is pain that is centered at the low back and extends down to both buttocks (left greater than right). The pain does not radiate down the legs. It is find as long as she is sitting or laying but upon standing, the pain is severe. She has been started on MSIR 7.56m that she takes several times a day but the pain persists. She is also passing bloody discharge from her rectum. She has lost about 10 lbs. She denies any nausea, vomiting, abdominal bloating or decreased appetite. She has had decreased ostomy output with harder stool. She also has RUQ pain that improves with passing stool. The stool is harder and stickier but not black and tarry. Despite  all this, she continues to work part-time in [place] and remains active with her family.   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 The tumor does not express HER-2.   Currently she is receiving radiation with Xeloda.  She seems to be responding and reports no need for pain medication at all at this time.  She is working with Dr. Ennever/oncology and Dr. Geraldo Docker oncology  Plans to see Dr Sigurd Sos at [facility] for further possible clinical trials   Clinical Assessment and Goals of Care:  This nurse practitioner met with Carla Mills in the outpatient radiation  oncology clinic to introduce to her the role of palliative medicine and a holistic treatment plan.  Initially I was contacted to engage with Carla Mills secondary to a conversation had with her primary insurance company looking for assistance with symptom management specifically to pain.  Thankfully Carla Mills has had pain relief secondary to her radiation and Xeloda, and is requiring no medication for pain at this time.  We did however today have a introductory and therapeutic conversation regarding her overall diagnosis, prognosis, and goals of care.  Patient is open to all offered and available medical interventions to prolong quality of life.  Her main hope and goal is to spend more time with her family.  Most of today's time was spent with snare is sharing of her love and appreciation for life.  She and her husband share three daughters.  She has extended support from her family and friends and church.  Discussed with patient the importance of continued conversation with her family and her medical providers regarding overall plan of care and treatment options,  ensuring decisions are within the context of the patients values and GOCs.  We talked about hospice being a resource for counseling services for not only herself but for her husband and children.  There understands the seriousness of her medical situation and again is hopeful for continued quality of life.    Patient and her husband have established healthcare power of attorney and an advanced directive.  Her husband is her main support person.     Made myself available to Carla Mills, I have given her the contact for the team phone and encouraged her to call with questions or concerns     I have reviewed the medical record, interviewed the patient and family, and examined the patient. The following aspects are pertinent.  Past Medical History:  Diagnosis Date  . Cancer of sigmoid colon metastatic to intra-abdominal lymph  node ([place]) 04/03/2016  . Colonic cancer ([place]) 03/09/2015  . History of chemotherapy    completed on 09/12/2015   . PONV (postoperative nausea and vomiting)   . Rectal mass 02/20/15   Rectal/sigmoid mass   Social History   Socioeconomic History  . Marital status: Married    Spouse name: Not on file  . Number of children: Not on file  . Years of education: Not on file  . Highest education level: Not on file  Social Needs  . Financial resource strain: Not on file  . Food insecurity - worry: Not on file  . Food insecurity - inability: Not on file  . Transportation needs - medical: Not on file  . Transportation needs - non-medical: Not on file  Occupational History  . Not on file  Tobacco Use  . Smoking status: Never Smoker  . Smokeless tobacco: Never Used  Substance and Sexual Activity  .  Alcohol use: Yes    Alcohol/week: 0.0 oz    Comment: socially   . Drug use: No  . Sexual activity: Not on file  Other Topics Concern  . Not on file  Social History Narrative  . Not on file   No family history on file. Scheduled Meds: Continuous Infusions: PRN Meds:. Medications Prior to Admission:  Prior to Admission medications   Medication Sig Start Date End Date Taking? Authorizing Provider  acetaminophen (TYLENOL) 500 MG tablet Take 1,000 mg by mouth every 8 (eight) hours as needed for moderate pain or headache.     [provider]  capecitabine (XELODA) 500 MG tablet TAKE 3 TABLETS (1,500 MG TOTAL) BY MOUTH TWO TIMES DAILY AFTER A MEAL. TAKE MONDAY THROUGH SATURDAY, TAKE SUNDAYS OFF 05/08/17   Volanda Napoleon, MD  ciprofloxacin (CIPRO) 500 MG tablet Take 1 tablet (500 mg total) by mouth 2 (two) times daily. For 3 days Patient not taking: Reported on 04/23/2017 03/12/17   Volanda Napoleon, MD  fexofenadine (ALLEGRA) 180 MG tablet Take 180 mg by mouth daily as needed for allergies.     [provider]  fluticasone (FLONASE) 50 MCG/ACT nasal spray Place 1 spray into  both nostrils 2 (two) times daily.     [provider]  morphine (MS CONTIN) 30 MG 12 hr tablet Take 1 tablet (30 mg total) by mouth every 12 (twelve) hours. 05/06/17   Gery Pray, MD  morphine (MSIR) 15 MG tablet Take 0.5 tablets (7.5 mg total) by mouth every 4 (four) hours as needed for severe pain. 05/01/17   Volanda Napoleon, MD  phenazopyridine (PYRIDIUM) 100 MG tablet Take 1 tablet (100 mg total) by mouth 3 (three) times daily as needed for pain. 03/12/17   Volanda Napoleon, MD  traMADol (ULTRAM) 50 MG tablet Take 1 tablet (50 mg total) by mouth every 6 (six) hours as needed. Patient taking differently: Take 50 mg by mouth every 6 (six) hours as needed for moderate pain.  03/15/16   Volanda Napoleon, MD   Allergies  Allergen Reactions  . Bactrim [Sulfamethoxazole-Trimethoprim]   . Oxaprozin Hives    REACTION: Hives  . Penicillins Hives    REACTION: Hives Has patient had a PCN reaction causing immediate rash, facial/tongue/throat swelling, SOB or lightheadedness with hypotension: no Has patient had a PCN reaction causing severe rash involving mucus membranes or skin necrosis: {no Has patient had a PCN reaction that required hospitalization no Has patient had a PCN reaction occurring within the last 10 years: no If all of the above answers are "NO", then may proceed with Cephalosporin use.  . Sulfonamide Derivatives Swelling    Massive extremity swelling    Review of Systems  Physical Exam  Vital Signs: There were no vitals taken for this visit.         SpO2:   O2 Device:  O2 Flow Rate: .   IO: Intake/output summary: No intake or output data in the 24 hours ending 05/20/17 1830  LBM:   Baseline Weight:   Most recent weight:       Palliative Assessment/Data:   80%    Time In: 0900 Time Out: 1000 Time Total: 60 minutes Greater than 50%  of this time was spent counseling and coordinating care related to the above assessment and plan.  Signed by: Wadie Lessen, NP   Please contact Palliative Medicine Team phone at [phone] for questions and concerns.  For individual provider: See  Amion

## 2017-05-21 ENCOUNTER — Other Ambulatory Visit: Payer: Self-pay | Admitting: *Deleted

## 2017-05-21 ENCOUNTER — Ambulatory Visit
Admission: RE | Admit: 2017-05-21 | Discharge: 2017-05-21 | Disposition: A | Discharge status: Home / Self Care | Payer: BLUE CROSS/BLUE SHIELD | Source: Ambulatory Visit | Attending: Radiation Oncology | Admitting: Radiation Oncology

## 2017-05-21 DIAGNOSIS — Z9221 Personal history of antineoplastic chemotherapy: Secondary | ICD-10-CM | POA: Diagnosis not present

## 2017-05-21 DIAGNOSIS — Z510 Encounter for antineoplastic radiation therapy: Secondary | ICD-10-CM | POA: Diagnosis not present

## 2017-05-21 DIAGNOSIS — C189 Malignant neoplasm of colon, unspecified: Secondary | ICD-10-CM

## 2017-05-21 DIAGNOSIS — Z888 Allergy status to other drugs, medicaments and biological substances status: Secondary | ICD-10-CM | POA: Diagnosis not present

## 2017-05-21 DIAGNOSIS — Z880 Allergy status to penicillin: Secondary | ICD-10-CM | POA: Diagnosis not present

## 2017-05-21 DIAGNOSIS — Z9889 Other specified postprocedural states: Secondary | ICD-10-CM | POA: Diagnosis not present

## 2017-05-21 DIAGNOSIS — Z79891 Long term (current) use of opiate analgesic: Secondary | ICD-10-CM | POA: Diagnosis not present

## 2017-05-21 DIAGNOSIS — Z79899 Other long term (current) drug therapy: Secondary | ICD-10-CM | POA: Diagnosis not present

## 2017-05-21 DIAGNOSIS — C187 Malignant neoplasm of sigmoid colon: Secondary | ICD-10-CM | POA: Diagnosis not present

## 2017-05-21 DIAGNOSIS — Z882 Allergy status to sulfonamides status: Secondary | ICD-10-CM | POA: Diagnosis not present

## 2017-05-21 DIAGNOSIS — G893 Neoplasm related pain (acute) (chronic): Secondary | ICD-10-CM | POA: Diagnosis not present

## 2017-05-21 DIAGNOSIS — Z85038 Personal history of other malignant neoplasm of large intestine: Secondary | ICD-10-CM | POA: Diagnosis not present

## 2017-05-22 ENCOUNTER — Inpatient Hospital Stay (HOSPITAL_BASED_OUTPATIENT_CLINIC_OR_DEPARTMENT_OTHER): Payer: BLUE CROSS/BLUE SHIELD | Admitting: Hematology & Oncology

## 2017-05-22 ENCOUNTER — Encounter: Payer: Self-pay | Admitting: Hematology & Oncology

## 2017-05-22 ENCOUNTER — Ambulatory Visit
Admission: RE | Admit: 2017-05-22 | Discharge: 2017-05-22 | Disposition: A | Discharge status: Home / Self Care | Payer: BLUE CROSS/BLUE SHIELD | Source: Ambulatory Visit | Attending: Radiation Oncology | Admitting: Radiation Oncology

## 2017-05-22 ENCOUNTER — Inpatient Hospital Stay: Payer: BLUE CROSS/BLUE SHIELD

## 2017-05-22 ENCOUNTER — Other Ambulatory Visit: Payer: Self-pay

## 2017-05-22 VITALS — BP 99/62 | HR 73 | Temp 98.6°F | Resp 18 | Wt 149.8 lb

## 2017-05-22 DIAGNOSIS — Z9221 Personal history of antineoplastic chemotherapy: Secondary | ICD-10-CM | POA: Diagnosis not present

## 2017-05-22 DIAGNOSIS — C189 Malignant neoplasm of colon, unspecified: Secondary | ICD-10-CM

## 2017-05-22 DIAGNOSIS — Z882 Allergy status to sulfonamides status: Secondary | ICD-10-CM | POA: Diagnosis not present

## 2017-05-22 DIAGNOSIS — Z85038 Personal history of other malignant neoplasm of large intestine: Secondary | ICD-10-CM | POA: Diagnosis not present

## 2017-05-22 DIAGNOSIS — Z888 Allergy status to other drugs, medicaments and biological substances status: Secondary | ICD-10-CM | POA: Diagnosis not present

## 2017-05-22 DIAGNOSIS — C187 Malignant neoplasm of sigmoid colon: Secondary | ICD-10-CM | POA: Diagnosis not present

## 2017-05-22 DIAGNOSIS — Z880 Allergy status to penicillin: Secondary | ICD-10-CM | POA: Diagnosis not present

## 2017-05-22 DIAGNOSIS — Z510 Encounter for antineoplastic radiation therapy: Secondary | ICD-10-CM | POA: Diagnosis not present

## 2017-05-22 DIAGNOSIS — Z79891 Long term (current) use of opiate analgesic: Secondary | ICD-10-CM | POA: Diagnosis not present

## 2017-05-22 DIAGNOSIS — C772 Secondary and unspecified malignant neoplasm of intra-abdominal lymph nodes: Principal | ICD-10-CM

## 2017-05-22 DIAGNOSIS — Z9889 Other specified postprocedural states: Secondary | ICD-10-CM | POA: Diagnosis not present

## 2017-05-22 DIAGNOSIS — Z79899 Other long term (current) drug therapy: Secondary | ICD-10-CM | POA: Diagnosis not present

## 2017-05-22 DIAGNOSIS — Z933 Colostomy status: Secondary | ICD-10-CM

## 2017-05-22 DIAGNOSIS — G893 Neoplasm related pain (acute) (chronic): Secondary | ICD-10-CM | POA: Diagnosis not present

## 2017-05-22 LAB — CBC WITH DIFFERENTIAL (CANCER CENTER ONLY)
BASOS ABS: 0 10*3/uL (ref 0.0–0.1)
Basophils Relative: 0 %
EOS PCT: 6 %
Eosinophils Absolute: 0.2 10*3/uL (ref 0.0–0.5)
HCT: 35.9 % (ref 34.8–46.6)
HEMOGLOBIN: 12.2 g/dL (ref 11.6–15.9)
LYMPHS PCT: 18 %
Lymphs Abs: 0.6 10*3/uL — ABNORMAL LOW (ref 0.9–3.3)
MCH: 31 pg (ref 26.0–34.0)
MCHC: 34 g/dL (ref 32.0–36.0)
MCV: 91.1 fL (ref 81.0–101.0)
Monocytes Absolute: 0.3 10*3/uL (ref 0.1–0.9)
Monocytes Relative: 10 %
NEUTROS ABS: 2.4 10*3/uL (ref 1.5–6.5)
NEUTROS PCT: 66 %
Platelet Count: 218 10*3/uL (ref 145–400)
RBC: 3.94 MIL/uL (ref 3.70–5.32)
RDW: 16 % — ABNORMAL HIGH (ref 11.1–15.7)
WBC: 3.6 10*3/uL — AB (ref 3.9–10.0)

## 2017-05-22 LAB — CMP (CANCER CENTER ONLY)
ALK PHOS: 69 U/L (ref 26–84)
ALT: 18 U/L (ref 10–47)
ANION GAP: 7 (ref 5–15)
AST: 22 U/L (ref 11–38)
Albumin: 3.6 g/dL (ref 3.5–5.0)
BILIRUBIN TOTAL: 0.8 mg/dL (ref 0.2–1.6)
BUN: 15 mg/dL (ref 7–22)
CHLORIDE: 108 mmol/L (ref 98–108)
CO2: 26 mmol/L (ref 18–33)
Calcium: 9.3 mg/dL (ref 8.0–10.3)
Creatinine: 0.6 mg/dL (ref 0.60–1.20)
Glucose, Bld: 80 mg/dL (ref 73–118)
Potassium: 3.7 mmol/L (ref 3.3–4.7)
Sodium: 141 mmol/L (ref 128–145)
TOTAL PROTEIN: 6.6 g/dL (ref 6.4–8.1)

## 2017-05-22 LAB — CEA (IN HOUSE-CHCC): CEA (CHCC-In House): 1.8 ng/mL (ref 0.00–5.00)

## 2017-05-22 MED ORDER — SODIUM CHLORIDE 0.9 % IJ SOLN
10.0000 mL | Freq: Once | INTRAMUSCULAR | Status: AC
  Administered 2017-05-22: 10 mL
  Filled 2017-05-22: qty 10

## 2017-05-22 MED ORDER — HEPARIN SOD (PORK) LOCK FLUSH 100 UNIT/ML IV SOLN
500.0000 [IU] | Freq: Once | INTRAVENOUS | Status: AC
  Administered 2017-05-22: 500 [IU] via INTRAVENOUS
  Filled 2017-05-22: qty 5

## 2017-05-22 NOTE — Progress Notes (Signed)
Hematology and Oncology Follow Up Visit  Samyukta Cura [id number] [birth year] 47 y.o. 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 Gritz is back for follow-up.  She really looks fantastic.  She is come around so nicely.  She is responding to radiation with Xeloda.  After about a week of radiation and Xeloda, she said that she did not need any more pain medication.  She is not bleeding.  She still have a little bit of discharge from the laparotomy scar.  This however is improving.  She is having some liquid stool with her colostomy.  She is not having any problems with dysuria or hematuria.  She did see Dr. Eppie Gibson at [facility].  She recommended that radiation and chemotherapy be done initially because of the pain and bleeding.  She is eating well.  She is having no nausea or vomiting.  She is having no cough.  She is having no rashes.  There is no leg swelling.    She and her husband are busy going to see their girls athletic events.  I am so glad that she is able to do this and enjoy it.  Overall, her performance status is ECOG 1.  Medications:  Allergies as of 05/22/2017      Reactions   Bactrim [sulfamethoxazole-trimethoprim]    Oxaprozin Hives   REACTION: Hives   Penicillins Hives   REACTION: Hives Has patient had a PCN reaction causing immediate rash, facial/tongue/throat swelling, SOB or lightheadedness with hypotension: no Has patient had a PCN reaction causing severe rash involving mucus membranes or skin necrosis: {no Has patient had a PCN reaction that required hospitalization no Has patient had a PCN reaction occurring within the last 10 years: no If all of the above answers are "NO",  then may proceed with Cephalosporin use.   Sulfonamide Derivatives Swelling   Massive extremity swelling       Medication List        Accurate as of 05/22/17  8:24 AM. Always use your most recent med list.          acetaminophen 500 MG tablet Commonly known as:  TYLENOL Take 1,000 mg by mouth every 8 (eight) hours as needed for moderate pain or headache.   capecitabine 500 MG tablet Commonly known as:  XELODA TAKE 3 TABLETS (1,500 MG TOTAL) BY MOUTH TWO TIMES DAILY AFTER A MEAL. TAKE MONDAY THROUGH SATURDAY, TAKE SUNDAYS OFF   ciprofloxacin 500 MG tablet Commonly known as:  CIPRO Take 1 tablet (500 mg total) by mouth 2 (two) times daily. For 3 days   fexofenadine 180 MG tablet Commonly known as:  ALLEGRA Take 180 mg by mouth daily as needed for allergies.   fluticasone 50 MCG/ACT nasal spray Commonly known as:  FLONASE Place 1 spray into both nostrils 2 (two) times daily.   morphine 15 MG tablet Commonly known as:  MSIR Take 0.5 tablets (7.5 mg total) by mouth every 4 (four) hours as needed for severe pain.   morphine 30 MG 12 hr tablet Commonly known as:  MS CONTIN Take 1 tablet (30 mg total) by mouth every 12 (twelve) hours.   phenazopyridine 100 MG tablet Commonly known as:  PYRIDIUM Take 1 tablet (100 mg total)  by mouth 3 (three) times daily as needed for pain.   traMADol 50 MG tablet Commonly known as:  ULTRAM Take 1 tablet (50 mg total) by mouth every 6 (six) hours as needed.       Allergies:  Allergies  Allergen Reactions  . Bactrim [Sulfamethoxazole-Trimethoprim]   . Oxaprozin Hives    REACTION: Hives  . Penicillins Hives    REACTION: Hives Has patient had a PCN reaction causing immediate rash, facial/tongue/throat swelling, SOB or lightheadedness with hypotension: no Has patient had a PCN reaction causing severe rash involving mucus membranes or skin necrosis: {no Has patient had a PCN reaction that required hospitalization no Has patient had a  PCN reaction occurring within the last 10 years: no If all of the above answers are "NO", then may proceed with Cephalosporin use.  . Sulfonamide Derivatives Swelling    Massive extremity swelling     Past Medical History, Surgical history, Social history, and Family History were reviewed and updated.  Review of Systems: Review of Systems  Constitutional: Negative.   HENT: Negative.   Eyes: Negative.   Respiratory: Negative.   Cardiovascular: Negative.   Gastrointestinal: Negative.   Genitourinary: Negative.   Musculoskeletal: Negative.   Skin: Negative.   Neurological: Negative.   Endo/Heme/Allergies: Negative.   Psychiatric/Behavioral: Negative.   All other systems reviewed and are negative.   Physical Exam:  weight is 149 lb 12 oz (67.9 kg). Her oral temperature is 98.6 F (37 C). Her blood pressure is 99/62 and her pulse is 73. Her respiration is 18 and oxygen saturation is 99%.   Wt Readings from Last 3 Encounters:  05/22/17 149 lb 12 oz (67.9 kg)  04/23/17 147 lb 3.2 oz (66.8 kg)  04/11/17 145 lb 0.6 oz (65.8 kg)    Physical Exam  Constitutional: She is oriented to person, place, and time.  HENT:  Head: Normocephalic and atraumatic.  Mouth/Throat: Oropharynx is clear and moist.  Eyes: EOM are normal. Pupils are equal, round, and reactive to light.  Neck: Normal range of motion.  Cardiovascular: Normal rate, regular rhythm and normal heart sounds.  Pulmonary/Chest: Effort normal and breath sounds normal.  Abdominal: Soft. Bowel sounds are normal.  She has a colostomy in the lower abdomen.  She has the fistula which is about 3 mm in size at the caudal end of her laparotomy scar.  The laparotomy scar is well  Musculoskeletal: Normal range of motion. She exhibits no edema, tenderness or deformity.  Lymphadenopathy:    She has no cervical adenopathy.  Neurological: She is alert and oriented to person, place, and time.  Skin: Skin is warm and dry. No rash noted. No  erythema.  Psychiatric: She has a normal mood and affect. Her behavior is normal. Judgment and thought content normal.  Vitals reviewed.    Lab Results  Component Value Date   WBC 3.6 (L) 05/22/2017   HGB 12.2 03/27/2017   HCT 35.9 05/22/2017   MCV 91.1 05/22/2017   PLT 218 05/22/2017   Lab Results  Component Value Date   FERRITIN 107 03/27/2017   IRON 31 (L) 03/27/2017   TIBC 285 03/27/2017   UIBC 254 03/27/2017   IRONPCTSAT 11 (L) 03/27/2017   Lab Results  Component Value Date   RETICCTPCT 0.8 02/12/2015   RBC 3.94 05/22/2017   No results found for: KPAFRELGTCHN, LAMBDASER, KAPLAMBRATIO No results found for: IGGSERUM, IGA, IGMSERUM No results found for: TOTALPROTELP, ALBUMINELP, A1GS, A2GS, BETS, BETA2SER, [employer], MSPIKE, SPEI  Chemistry      Component Value Date/Time   NA 142 04/28/2017 1150   NA 145 03/27/2017 1101   NA 141 07/30/2016 0750   K 4.0 04/28/2017 1150   K 4.5 03/27/2017 1101   K 4.2 07/30/2016 0750   CL 105 04/28/2017 1150   CL 103 03/27/2017 1101   CO2 28 04/28/2017 1150   CO2 28 03/27/2017 1101   CO2 26 07/30/2016 0750   BUN 13 04/28/2017 1150   BUN 12 03/27/2017 1101   BUN 12.1 07/30/2016 0750   CREATININE 0.67 04/28/2017 1150   CREATININE 0.9 03/27/2017 1101   CREATININE 0.7 07/30/2016 0750      Component Value Date/Time   CALCIUM 9.6 04/28/2017 1150   CALCIUM 9.6 03/27/2017 1101   CALCIUM 9.5 07/30/2016 0750   ALKPHOS 78 04/11/2017 1212   ALKPHOS 101 (H) 03/27/2017 1101   ALKPHOS 72 07/30/2016 0750   AST 18 04/11/2017 1212   AST 22 07/30/2016 0750   ALT 18 04/11/2017 1212   ALT 20 03/27/2017 1101   ALT 22 07/30/2016 0750   BILITOT 0.8 04/11/2017 1212   BILITOT 0.54 07/30/2016 0750      Impression and Plan: Ms. Mohs is a very pleasant 47 yo caucasian female with recurrent colon cancer - HER2 (-)/KRAS (+)/MSI stable/MMR normal/BRAF -.   Again, I am just so happy that her quality of life is doing so much better right  now.  She is halfway through treatment.  I will see her back after she completes her treatment.  She will then follow-up with Dr. Sigurd Sos at [facility] to see if there is any clinical studies that she can qualify for.  If she does not qualify for a clinical trial, then I would recommend FOLFOXIRI with Avastin.  She has not had Avastin as of yet.  This might help [place] out.  We will see what her CEA level is.  It really is never been all that high.  I spent about 40 minutes with she and her husband.  Over 50% of the time was spent face-to-face talking to her about her lab report and going over recommendations for treatment after she completes her radiation/Xeloda.    Volanda Napoleon, MD 2/28/20198:24 AM

## 2017-05-22 NOTE — Patient Instructions (Signed)
Implanted Port Home Guide An implanted port is a type of central line that is placed under the skin. Central lines are used to provide IV access when treatment or nutrition needs to be given through a person's veins. Implanted ports are used for long-term IV access. An implanted port may be placed because:  You need IV medicine that would be irritating to the small veins in your hands or arms.  You need long-term IV medicines, such as antibiotics.  You need IV nutrition for a long period.  You need frequent blood draws for lab tests.  You need dialysis.  Implanted ports are usually placed in the chest area, but they can also be placed in the upper arm, the abdomen, or the leg. An implanted port has two main parts:  Reservoir. The reservoir is round and will appear as a small, raised area under your skin. The reservoir is the part where a needle is inserted to give medicines or draw blood.  Catheter. The catheter is a thin, flexible tube that extends from the reservoir. The catheter is placed into a large vein. Medicine that is inserted into the reservoir goes into the catheter and then into the vein.  How will I care for my incision site? Do not get the incision site wet. Bathe or shower as directed by your health care provider. How is my port accessed? Special steps must be taken to access the port:  Before the port is accessed, a numbing cream can be placed on the skin. This helps numb the skin over the port site.  Your health care provider uses a sterile technique to access the port. ? Your health care provider must put on a mask and sterile gloves. ? The skin over your port is cleaned carefully with an antiseptic and allowed to dry. ? The port is gently pinched between sterile gloves, and a needle is inserted into the port.  Only "non-coring" port needles should be used to access the port. Once the port is accessed, a blood return should be checked. This helps ensure that the port  is in the vein and is not clogged.  If your port needs to remain accessed for a constant infusion, a clear (transparent) bandage will be placed over the needle site. The bandage and needle will need to be changed every week, or as directed by your health care provider.  Keep the bandage covering the needle clean and dry. Do not get it wet. Follow your health care provider's instructions on how to take a shower or bath while the port is accessed.  If your port does not need to stay accessed, no bandage is needed over the port.  What is flushing? Flushing helps keep the port from getting clogged. Follow your health care provider's instructions on how and when to flush the port. Ports are usually flushed with saline solution or a medicine called heparin. The need for flushing will depend on how the port is used.  If the port is used for intermittent medicines or blood draws, the port will need to be flushed: ? After medicines have been given. ? After blood has been drawn. ? As part of routine maintenance.  If a constant infusion is running, the port may not need to be flushed.  How long will my port stay implanted? The port can stay in for as long as your health care provider thinks it is needed. When it is time for the port to come out, surgery will be   done to remove it. The procedure is similar to the one performed when the port was put in. When should I seek immediate medical care? When you have an implanted port, you should seek immediate medical care if:  You notice a bad smell coming from the incision site.  You have swelling, redness, or drainage at the incision site.  You have more swelling or pain at the port site or the surrounding area.  You have a fever that is not controlled with medicine.  This information is not intended to replace advice given to you by your health care provider. Make sure you discuss any questions you have with your health care provider. Document  Released: 03/11/2005 Document Revised: 08/17/2015 Document Reviewed: 11/16/2012 Elsevier Interactive Patient Education  2017 [employer]  

## 2017-05-23 ENCOUNTER — Ambulatory Visit
Admission: RE | Admit: 2017-05-23 | Discharge: 2017-05-23 | Disposition: A | Discharge status: Home / Self Care | Payer: BLUE CROSS/BLUE SHIELD | Source: Ambulatory Visit | Attending: Radiation Oncology | Admitting: Radiation Oncology

## 2017-05-23 DIAGNOSIS — C772 Secondary and unspecified malignant neoplasm of intra-abdominal lymph nodes: Secondary | ICD-10-CM | POA: Insufficient documentation

## 2017-05-23 DIAGNOSIS — Z510 Encounter for antineoplastic radiation therapy: Secondary | ICD-10-CM | POA: Insufficient documentation

## 2017-05-23 DIAGNOSIS — C187 Malignant neoplasm of sigmoid colon: Secondary | ICD-10-CM | POA: Diagnosis not present

## 2017-05-26 ENCOUNTER — Ambulatory Visit
Admission: RE | Admit: 2017-05-26 | Discharge: 2017-05-26 | Disposition: A | Discharge status: Home / Self Care | Payer: BLUE CROSS/BLUE SHIELD | Source: Ambulatory Visit | Attending: Radiation Oncology | Admitting: Radiation Oncology

## 2017-05-26 DIAGNOSIS — C772 Secondary and unspecified malignant neoplasm of intra-abdominal lymph nodes: Secondary | ICD-10-CM | POA: Diagnosis not present

## 2017-05-26 DIAGNOSIS — Z510 Encounter for antineoplastic radiation therapy: Secondary | ICD-10-CM | POA: Diagnosis not present

## 2017-05-26 DIAGNOSIS — C187 Malignant neoplasm of sigmoid colon: Secondary | ICD-10-CM | POA: Diagnosis not present

## 2017-05-27 ENCOUNTER — Ambulatory Visit
Admission: RE | Admit: 2017-05-27 | Discharge: 2017-05-27 | Disposition: A | Discharge status: Home / Self Care | Payer: BLUE CROSS/BLUE SHIELD | Source: Ambulatory Visit | Attending: Radiation Oncology | Admitting: Radiation Oncology

## 2017-05-27 DIAGNOSIS — Z510 Encounter for antineoplastic radiation therapy: Secondary | ICD-10-CM | POA: Diagnosis not present

## 2017-05-27 DIAGNOSIS — C187 Malignant neoplasm of sigmoid colon: Secondary | ICD-10-CM | POA: Diagnosis not present

## 2017-05-27 DIAGNOSIS — C772 Secondary and unspecified malignant neoplasm of intra-abdominal lymph nodes: Secondary | ICD-10-CM | POA: Diagnosis not present

## 2017-05-28 ENCOUNTER — Ambulatory Visit
Admission: RE | Admit: 2017-05-28 | Discharge: 2017-05-28 | Disposition: A | Discharge status: Home / Self Care | Payer: BLUE CROSS/BLUE SHIELD | Source: Ambulatory Visit | Attending: Radiation Oncology | Admitting: Radiation Oncology

## 2017-05-28 DIAGNOSIS — C772 Secondary and unspecified malignant neoplasm of intra-abdominal lymph nodes: Secondary | ICD-10-CM | POA: Diagnosis not present

## 2017-05-28 DIAGNOSIS — C187 Malignant neoplasm of sigmoid colon: Secondary | ICD-10-CM | POA: Diagnosis not present

## 2017-05-28 DIAGNOSIS — Z510 Encounter for antineoplastic radiation therapy: Secondary | ICD-10-CM | POA: Diagnosis not present

## 2017-05-29 ENCOUNTER — Ambulatory Visit
Admission: RE | Admit: 2017-05-29 | Discharge: 2017-05-29 | Disposition: A | Discharge status: Home / Self Care | Payer: BLUE CROSS/BLUE SHIELD | Source: Ambulatory Visit | Attending: Radiation Oncology | Admitting: Radiation Oncology

## 2017-05-29 DIAGNOSIS — Z510 Encounter for antineoplastic radiation therapy: Secondary | ICD-10-CM | POA: Diagnosis not present

## 2017-05-29 DIAGNOSIS — C187 Malignant neoplasm of sigmoid colon: Secondary | ICD-10-CM | POA: Diagnosis not present

## 2017-05-29 DIAGNOSIS — C772 Secondary and unspecified malignant neoplasm of intra-abdominal lymph nodes: Secondary | ICD-10-CM | POA: Diagnosis not present

## 2017-05-30 ENCOUNTER — Ambulatory Visit
Admission: RE | Admit: 2017-05-30 | Discharge: 2017-05-30 | Disposition: A | Discharge status: Home / Self Care | Payer: BLUE CROSS/BLUE SHIELD | Source: Ambulatory Visit | Attending: Radiation Oncology | Admitting: Radiation Oncology

## 2017-05-30 DIAGNOSIS — C187 Malignant neoplasm of sigmoid colon: Secondary | ICD-10-CM | POA: Diagnosis not present

## 2017-05-30 DIAGNOSIS — C772 Secondary and unspecified malignant neoplasm of intra-abdominal lymph nodes: Secondary | ICD-10-CM | POA: Diagnosis not present

## 2017-05-30 DIAGNOSIS — Z510 Encounter for antineoplastic radiation therapy: Secondary | ICD-10-CM | POA: Diagnosis not present

## 2017-06-02 ENCOUNTER — Ambulatory Visit
Admission: RE | Admit: 2017-06-02 | Discharge: 2017-06-02 | Disposition: A | Discharge status: Home / Self Care | Payer: BLUE CROSS/BLUE SHIELD | Source: Ambulatory Visit | Attending: Radiation Oncology | Admitting: Radiation Oncology

## 2017-06-02 DIAGNOSIS — Z510 Encounter for antineoplastic radiation therapy: Secondary | ICD-10-CM | POA: Diagnosis not present

## 2017-06-02 DIAGNOSIS — C772 Secondary and unspecified malignant neoplasm of intra-abdominal lymph nodes: Secondary | ICD-10-CM | POA: Diagnosis not present

## 2017-06-02 DIAGNOSIS — C187 Malignant neoplasm of sigmoid colon: Secondary | ICD-10-CM | POA: Diagnosis not present

## 2017-06-03 ENCOUNTER — Ambulatory Visit
Admission: RE | Admit: 2017-06-03 | Discharge: 2017-06-03 | Disposition: A | Discharge status: Home / Self Care | Payer: BLUE CROSS/BLUE SHIELD | Source: Ambulatory Visit | Attending: Radiation Oncology | Admitting: Radiation Oncology

## 2017-06-03 ENCOUNTER — Telehealth: Payer: Self-pay | Admitting: *Deleted

## 2017-06-03 DIAGNOSIS — C772 Secondary and unspecified malignant neoplasm of intra-abdominal lymph nodes: Secondary | ICD-10-CM | POA: Diagnosis not present

## 2017-06-03 DIAGNOSIS — Z510 Encounter for antineoplastic radiation therapy: Secondary | ICD-10-CM | POA: Diagnosis not present

## 2017-06-03 DIAGNOSIS — Z933 Colostomy status: Secondary | ICD-10-CM | POA: Diagnosis not present

## 2017-06-03 DIAGNOSIS — C187 Malignant neoplasm of sigmoid colon: Secondary | ICD-10-CM | POA: Diagnosis not present

## 2017-06-03 DIAGNOSIS — Z85038 Personal history of other malignant neoplasm of large intestine: Secondary | ICD-10-CM | POA: Diagnosis not present

## 2017-06-03 NOTE — Telephone Encounter (Signed)
CALLED PATIENT TO INFORM OF FU VISIT WITH DR. Daphnedale Park ON 07-07-17 @ 3:15 PM, SPOKE WITH PATIENT AND SHE IS AWARE OF THIS APPT.

## 2017-06-04 ENCOUNTER — Ambulatory Visit
Admission: RE | Admit: 2017-06-04 | Discharge: 2017-06-04 | Disposition: A | Discharge status: Home / Self Care | Payer: BLUE CROSS/BLUE SHIELD | Source: Ambulatory Visit | Attending: Radiation Oncology | Admitting: Radiation Oncology

## 2017-06-04 DIAGNOSIS — C772 Secondary and unspecified malignant neoplasm of intra-abdominal lymph nodes: Secondary | ICD-10-CM | POA: Diagnosis not present

## 2017-06-04 DIAGNOSIS — C187 Malignant neoplasm of sigmoid colon: Secondary | ICD-10-CM | POA: Diagnosis not present

## 2017-06-04 DIAGNOSIS — Z510 Encounter for antineoplastic radiation therapy: Secondary | ICD-10-CM | POA: Diagnosis not present

## 2017-06-05 ENCOUNTER — Ambulatory Visit
Admission: RE | Admit: 2017-06-05 | Discharge: 2017-06-05 | Disposition: A | Discharge status: Home / Self Care | Payer: BLUE CROSS/BLUE SHIELD | Source: Ambulatory Visit | Attending: Radiation Oncology | Admitting: Radiation Oncology

## 2017-06-05 DIAGNOSIS — C772 Secondary and unspecified malignant neoplasm of intra-abdominal lymph nodes: Secondary | ICD-10-CM | POA: Diagnosis not present

## 2017-06-05 DIAGNOSIS — Z510 Encounter for antineoplastic radiation therapy: Secondary | ICD-10-CM | POA: Diagnosis not present

## 2017-06-05 DIAGNOSIS — C187 Malignant neoplasm of sigmoid colon: Secondary | ICD-10-CM | POA: Diagnosis not present

## 2017-06-06 ENCOUNTER — Ambulatory Visit
Admission: RE | Admit: 2017-06-06 | Discharge: 2017-06-06 | Disposition: A | Discharge status: Home / Self Care | Payer: BLUE CROSS/BLUE SHIELD | Source: Ambulatory Visit | Attending: Radiation Oncology | Admitting: Radiation Oncology

## 2017-06-06 DIAGNOSIS — Z510 Encounter for antineoplastic radiation therapy: Secondary | ICD-10-CM | POA: Diagnosis not present

## 2017-06-06 DIAGNOSIS — C187 Malignant neoplasm of sigmoid colon: Secondary | ICD-10-CM | POA: Diagnosis not present

## 2017-06-06 DIAGNOSIS — C772 Secondary and unspecified malignant neoplasm of intra-abdominal lymph nodes: Secondary | ICD-10-CM | POA: Diagnosis not present

## 2017-06-09 ENCOUNTER — Encounter: Payer: Self-pay | Admitting: Hematology & Oncology

## 2017-06-09 ENCOUNTER — Encounter: Payer: Self-pay | Admitting: Radiation Oncology

## 2017-06-09 ENCOUNTER — Inpatient Hospital Stay: Payer: BLUE CROSS/BLUE SHIELD

## 2017-06-09 ENCOUNTER — Inpatient Hospital Stay: Payer: BLUE CROSS/BLUE SHIELD | Attending: Radiation Oncology | Admitting: Hematology & Oncology

## 2017-06-09 ENCOUNTER — Other Ambulatory Visit: Payer: Self-pay

## 2017-06-09 VITALS — BP 122/67 | HR 74 | Temp 97.9°F | Resp 18 | Wt 151.2 lb

## 2017-06-09 DIAGNOSIS — C189 Malignant neoplasm of colon, unspecified: Secondary | ICD-10-CM | POA: Diagnosis not present

## 2017-06-09 DIAGNOSIS — C187 Malignant neoplasm of sigmoid colon: Secondary | ICD-10-CM

## 2017-06-09 DIAGNOSIS — Z933 Colostomy status: Secondary | ICD-10-CM

## 2017-06-09 DIAGNOSIS — Z515 Encounter for palliative care: Secondary | ICD-10-CM | POA: Insufficient documentation

## 2017-06-09 DIAGNOSIS — Z7189 Other specified counseling: Secondary | ICD-10-CM | POA: Insufficient documentation

## 2017-06-09 DIAGNOSIS — C772 Secondary and unspecified malignant neoplasm of intra-abdominal lymph nodes: Principal | ICD-10-CM

## 2017-06-09 DIAGNOSIS — G893 Neoplasm related pain (acute) (chronic): Secondary | ICD-10-CM | POA: Insufficient documentation

## 2017-06-09 LAB — CMP (CANCER CENTER ONLY)
ALBUMIN: 3.8 g/dL (ref 3.5–5.0)
ALK PHOS: 83 U/L (ref 26–84)
ALT: 39 U/L (ref 10–47)
AST: 27 U/L (ref 11–38)
Anion gap: 8 (ref 5–15)
BILIRUBIN TOTAL: 0.8 mg/dL (ref 0.2–1.6)
BUN: 14 mg/dL (ref 7–22)
CALCIUM: 9.5 mg/dL (ref 8.0–10.3)
CHLORIDE: 106 mmol/L (ref 98–108)
CO2: 28 mmol/L (ref 18–33)
CREATININE: 0.9 mg/dL (ref 0.60–1.20)
Glucose, Bld: 94 mg/dL (ref 73–118)
Potassium: 3.8 mmol/L (ref 3.3–4.7)
SODIUM: 142 mmol/L (ref 128–145)
Total Protein: 7 g/dL (ref 6.4–8.1)

## 2017-06-09 LAB — CBC WITH DIFFERENTIAL (CANCER CENTER ONLY)
BASOS ABS: 0 10*3/uL (ref 0.0–0.1)
BASOS PCT: 0 %
Eosinophils Absolute: 0.1 10*3/uL (ref 0.0–0.5)
Eosinophils Relative: 3 %
HEMATOCRIT: 36.4 % (ref 34.8–46.6)
Hemoglobin: 12.6 g/dL (ref 11.6–15.9)
LYMPHS PCT: 9 %
Lymphs Abs: 0.5 10*3/uL — ABNORMAL LOW (ref 0.9–3.3)
MCH: 32.4 pg (ref 26.0–34.0)
MCHC: 34.6 g/dL (ref 32.0–36.0)
MCV: 93.6 fL (ref 81.0–101.0)
MONO ABS: 0.4 10*3/uL (ref 0.1–0.9)
Monocytes Relative: 9 %
NEUTROS ABS: 3.9 10*3/uL (ref 1.5–6.5)
NEUTROS PCT: 79 %
Platelet Count: 206 10*3/uL (ref 145–400)
RBC: 3.89 MIL/uL (ref 3.70–5.32)
RDW: 18.3 % — AB (ref 11.1–15.7)
WBC: 5 10*3/uL (ref 3.9–10.0)

## 2017-06-09 LAB — LACTATE DEHYDROGENASE: LDH: 214 U/L (ref 125–245)

## 2017-06-09 LAB — CEA (IN HOUSE-CHCC): CEA (CHCC-In House): 2.37 ng/mL (ref 0.00–5.00)

## 2017-06-09 MED ORDER — HEPARIN SOD (PORK) LOCK FLUSH 100 UNIT/ML IV SOLN
500.0000 [IU] | Freq: Once | INTRAVENOUS | Status: AC
  Administered 2017-06-09: 500 [IU] via INTRAVENOUS
  Filled 2017-06-09: qty 5

## 2017-06-09 MED ORDER — SODIUM CHLORIDE 0.9% FLUSH
10.0000 mL | INTRAVENOUS | Status: DC | PRN
  Administered 2017-06-09: 10 mL via INTRAVENOUS
  Filled 2017-06-09: qty 10

## 2017-06-09 NOTE — Progress Notes (Signed)
Hematology and Oncology Follow Up Visit  Carla Mills [id number] Jan 12, 1971 47 y.o. 06/09/2017   Principle Diagnosis:  Recurrent colon cancer - HER2 (-)/KRAS mutant/MSI stable/MMR normal/BRAF (wt) - inoperable  Past Therapy:   FOLFOXIRI - s/p cycle #12 - completed on 11/12/2016 Status post exploratory laparotomy with HIPEC - 07/29/2016  Current Therapy: Radiation therapy with Xeloda - completed on 06/06/2017   Interim History:  Carla Mills is back for follow-up.  She really looks fantastic.  In reality, I am really not surprised that she tolerated radiation and Xeloda so well.  She is always had an incredible amount of toughness.  She says that she is not hurting.  I think this is a very good indicator that she has responded nicely.   Her colostomy is working quite well.  She is had no bleeding.  Her weight is holding stable.  She is eating what she wants.  She actually got a call from [facility].  She will go there on April 1.  She will have a CT scan done.  My only concern is that the CT scan may not show the actual response.  The PET scan was really what showed is how much tumor she was dealing with.  She will see Dr. Sigurd Sos in early April to see if she qualifies for a protocol.  She sounds like she actually may undergo surgical debulking.  I think if this could be done this would clearly be the way to go to try to minimize her amount of residual disease that needs therapy with systemic therapy.  If Carla Mills does not qualify for surgery or does not qualify for a clinical trial, we will use systemic therapy here, with Avastin.  I probably will use FOLFOXIRI as the chemotherapy "backbone".    She and her family want to take spring break I think to [place] for a few days.  From my perspective I do not see any problems with her doing this.    Overall, I said her performance status is ECOG 1.     Medications:  Allergies as of 06/09/2017      Reactions   Bactrim  [sulfamethoxazole-trimethoprim]    Oxaprozin Hives   REACTION: Hives   Penicillins Hives   REACTION: Hives Has patient had a PCN reaction causing immediate rash, facial/tongue/throat swelling, SOB or lightheadedness with hypotension: no Has patient had a PCN reaction causing severe rash involving mucus membranes or skin necrosis: {no Has patient had a PCN reaction that required hospitalization no Has patient had a PCN reaction occurring within the last 10 years: no If all of the above answers are "NO", then may proceed with Cephalosporin use.   Sulfonamide Derivatives Swelling   Massive extremity swelling       Medication List        Accurate as of 06/09/17 12:03 PM. Always use your most recent med list.          acetaminophen 500 MG tablet Commonly known as:  TYLENOL Take 1,000 mg by mouth every 8 (eight) hours as needed for moderate pain or headache.   capecitabine 500 MG tablet Commonly known as:  XELODA TAKE 3 TABLETS (1,500 MG TOTAL) BY MOUTH TWO TIMES DAILY AFTER A MEAL. TAKE MONDAY THROUGH SATURDAY, TAKE SUNDAYS OFF   ciprofloxacin 500 MG tablet Commonly known as:  CIPRO Take 1 tablet (500 mg total) by mouth 2 (two) times daily. For 3 days   fexofenadine 180 MG tablet Commonly known as:  ALLEGRA Take  180 mg by mouth daily as needed for allergies.   fluticasone 50 MCG/ACT nasal spray Commonly known as:  FLONASE Place 1 spray into both nostrils 2 (two) times daily.   morphine 15 MG tablet Commonly known as:  MSIR Take 0.5 tablets (7.5 mg total) by mouth every 4 (four) hours as needed for severe pain.   morphine 30 MG 12 hr tablet Commonly known as:  MS CONTIN Take 1 tablet (30 mg total) by mouth every 12 (twelve) hours.   phenazopyridine 100 MG tablet Commonly known as:  PYRIDIUM Take 1 tablet (100 mg total) by mouth 3 (three) times daily as needed for pain.   traMADol 50 MG tablet Commonly known as:  ULTRAM Take 1 tablet (50 mg total) by mouth every 6  (six) hours as needed.       Allergies:  Allergies  Allergen Reactions  . Bactrim [Sulfamethoxazole-Trimethoprim]   . Oxaprozin Hives    REACTION: Hives  . Penicillins Hives    REACTION: Hives Has patient had a PCN reaction causing immediate rash, facial/tongue/throat swelling, SOB or lightheadedness with hypotension: no Has patient had a PCN reaction causing severe rash involving mucus membranes or skin necrosis: {no Has patient had a PCN reaction that required hospitalization no Has patient had a PCN reaction occurring within the last 10 years: no If all of the above answers are "NO", then may proceed with Cephalosporin use.  . Sulfonamide Derivatives Swelling    Massive extremity swelling     Past Medical History, Surgical history, Social history, and Family History were reviewed and updated.  Review of Systems: Review of Systems  Constitutional: Negative.   HENT: Negative.   Eyes: Negative.   Respiratory: Negative.   Cardiovascular: Negative.   Gastrointestinal: Negative.   Genitourinary: Negative.   Musculoskeletal: Negative.   Skin: Negative.   Neurological: Negative.   Endo/Heme/Allergies: Negative.   Psychiatric/Behavioral: Negative.   All other systems reviewed and are negative.   Physical Exam:  weight is 151 lb 4 oz (68.6 kg). Her oral temperature is 97.9 F (36.6 C). Her blood pressure is 122/67 and her pulse is 74. Her respiration is 18 and oxygen saturation is 100%.   Wt Readings from Last 3 Encounters:  06/09/17 151 lb 4 oz (68.6 kg)  05/22/17 149 lb 12 oz (67.9 kg)  04/23/17 147 lb 3.2 oz (66.8 kg)    Physical Exam  Constitutional: She is oriented to person, place, and time.  HENT:  Head: Normocephalic and atraumatic.  Mouth/Throat: Oropharynx is clear and moist.  Eyes: EOM are normal. Pupils are equal, round, and reactive to light.  Neck: Normal range of motion.  Cardiovascular: Normal rate, regular rhythm and normal heart sounds.    Pulmonary/Chest: Effort normal and breath sounds normal.  Abdominal: Soft. Bowel sounds are normal.  She has a colostomy in the lower abdomen.  She has the fistula which is about 3 mm in size at the caudal end of her laparotomy scar.  The laparotomy scar is well  Musculoskeletal: Normal range of motion. She exhibits no edema, tenderness or deformity.  Lymphadenopathy:    She has no cervical adenopathy.  Neurological: She is alert and oriented to person, place, and time.  Skin: Skin is warm and dry. No rash noted. No erythema.  Psychiatric: She has a normal mood and affect. Her behavior is normal. Judgment and thought content normal.  Vitals reviewed.    Lab Results  Component Value Date   WBC 5.0 06/09/2017  HGB 12.2 03/27/2017   HCT 36.4 06/09/2017   MCV 93.6 06/09/2017   PLT 206 06/09/2017   Lab Results  Component Value Date   FERRITIN 107 03/27/2017   IRON 31 (L) 03/27/2017   TIBC 285 03/27/2017   UIBC 254 03/27/2017   IRONPCTSAT 11 (L) 03/27/2017   Lab Results  Component Value Date   RETICCTPCT 0.8 02/12/2015   RBC 3.89 06/09/2017   No results found for: KPAFRELGTCHN, LAMBDASER, KAPLAMBRATIO No results found for: IGGSERUM, IGA, IGMSERUM No results found for: Odetta Pink, SPEI   Chemistry      Component Value Date/Time   NA 142 06/09/2017 1050   NA 145 03/27/2017 1101   NA 141 07/30/2016 0750   K 3.8 06/09/2017 1050   K 4.5 03/27/2017 1101   K 4.2 07/30/2016 0750   CL 106 06/09/2017 1050   CL 103 03/27/2017 1101   CO2 28 06/09/2017 1050   CO2 28 03/27/2017 1101   CO2 26 07/30/2016 0750   BUN 14 06/09/2017 1050   BUN 12 03/27/2017 1101   BUN 12.1 07/30/2016 0750   CREATININE 0.90 06/09/2017 1050   CREATININE 0.9 03/27/2017 1101   CREATININE 0.7 07/30/2016 0750      Component Value Date/Time   CALCIUM 9.5 06/09/2017 1050   CALCIUM 9.6 03/27/2017 1101   CALCIUM 9.5 07/30/2016 0750   ALKPHOS 83  06/09/2017 1050   ALKPHOS 101 (H) 03/27/2017 1101   ALKPHOS 72 07/30/2016 0750   AST 27 06/09/2017 1050   AST 22 07/30/2016 0750   ALT 39 06/09/2017 1050   ALT 20 03/27/2017 1101   ALT 22 07/30/2016 0750   BILITOT 0.8 06/09/2017 1050   BILITOT 0.54 07/30/2016 0750      Impression and Plan: Carla Mills is a very pleasant 47 yo caucasian female with recurrent colon cancer - HER2 (-)/KRAS (+)/MSI stable/MMR normal/BRAF -.   Again, I am just so happy that her quality of life is doing so much better right now.  I think the next step really was going to be the CT scan that she has at [facility]  Dr. Sigurd Sos is always doing a fantastic job in keeping [place] in touch with what is going on at [facility].  I have not set up Carla Mills for a follow-up appointment until she gets back from [facility] and I hear from Dr. Sigurd Sos.  Volanda Napoleon, MD 3/18/201912:03 PM

## 2017-06-09 NOTE — Patient Instructions (Signed)
Implanted Port Home Guide An implanted port is a type of central line that is placed under the skin. Central lines are used to provide IV access when treatment or nutrition needs to be given through a person's veins. Implanted ports are used for long-term IV access. An implanted port may be placed because:  You need IV medicine that would be irritating to the small veins in your hands or arms.  You need long-term IV medicines, such as antibiotics.  You need IV nutrition for a long period.  You need frequent blood draws for lab tests.  You need dialysis.  Implanted ports are usually placed in the chest area, but they can also be placed in the upper arm, the abdomen, or the leg. An implanted port has two main parts:  Reservoir. The reservoir is round and will appear as a small, raised area under your skin. The reservoir is the part where a needle is inserted to give medicines or draw blood.  Catheter. The catheter is a thin, flexible tube that extends from the reservoir. The catheter is placed into a large vein. Medicine that is inserted into the reservoir goes into the catheter and then into the vein.  How will I care for my incision site? Do not get the incision site wet. Bathe or shower as directed by your health care provider. How is my port accessed? Special steps must be taken to access the port:  Before the port is accessed, a numbing cream can be placed on the skin. This helps numb the skin over the port site.  Your health care provider uses a sterile technique to access the port. ? Your health care provider must put on a mask and sterile gloves. ? The skin over your port is cleaned carefully with an antiseptic and allowed to dry. ? The port is gently pinched between sterile gloves, and a needle is inserted into the port.  Only "non-coring" port needles should be used to access the port. Once the port is accessed, a blood return should be checked. This helps ensure that the port  is in the vein and is not clogged.  If your port needs to remain accessed for a constant infusion, a clear (transparent) bandage will be placed over the needle site. The bandage and needle will need to be changed every week, or as directed by your health care provider.  Keep the bandage covering the needle clean and dry. Do not get it wet. Follow your health care provider's instructions on how to take a shower or bath while the port is accessed.  If your port does not need to stay accessed, no bandage is needed over the port.  What is flushing? Flushing helps keep the port from getting clogged. Follow your health care provider's instructions on how and when to flush the port. Ports are usually flushed with saline solution or a medicine called heparin. The need for flushing will depend on how the port is used.  If the port is used for intermittent medicines or blood draws, the port will need to be flushed: ? After medicines have been given. ? After blood has been drawn. ? As part of routine maintenance.  If a constant infusion is running, the port may not need to be flushed.  How long will my port stay implanted? The port can stay in for as long as your health care provider thinks it is needed. When it is time for the port to come out, surgery will be   done to remove it. The procedure is similar to the one performed when the port was put in. When should I seek immediate medical care? When you have an implanted port, you should seek immediate medical care if:  You notice a bad smell coming from the incision site.  You have swelling, redness, or drainage at the incision site.  You have more swelling or pain at the port site or the surrounding area.  You have a fever that is not controlled with medicine.  This information is not intended to replace advice given to you by your health care provider. Make sure you discuss any questions you have with your health care provider. Document  Released: 03/11/2005 Document Revised: 08/17/2015 Document Reviewed: 11/16/2012 Elsevier Interactive Patient Education  2017 [employer]  

## 2017-06-09 NOTE — Progress Notes (Signed)
  Radiation Oncology         [phone] ________________________________  Name: Carla Mills MRN: [id number]  Date: 06/09/2017  DOB: 25-Feb-1971  End of Treatment Note  Diagnosis: Recurrent unresectable colon cancer, KRAS mutation    Indication for treatment: Palliative       Radiation treatment dates: 05/05/17-06/06/17  Site/dose:  Pelvis/ 45 Gy in 25 fractions, patient was treated with radiosensitizing chemotherapy (Xeloda)  Beams/energy:  IMRT/ 10X, 15X  Narrative: The patient tolerated radiation treatment relatively well. During treatment the patient complained of gas pain and mild fatigue. She denied nausea, vomiting, diarrhea,or bowel/ bladder issues.  Overall her pain improved significantly during the course of treatment.    Plan: The patient has completed radiation treatment. The patient will return to radiation oncology clinic for routine followup in one month. I advised them to call or return sooner if they have any questions or concerns related to their recovery or treatment.  Patient is being evaluated for potential surgery at [facility].  -----------------------------------  Blair Promise, PhD, MD  This document serves as a record of services personally performed by Gery Pray, MD. It was created on his behalf by Bethann Humble, a trained medical scribe. The creation of this record is based on the scribe's personal observations and the provider's statements to them. This document has been checked and approved by the attending provider.

## 2017-06-16 ENCOUNTER — Inpatient Hospital Stay (HOSPITAL_BASED_OUTPATIENT_CLINIC_OR_DEPARTMENT_OTHER): Payer: BLUE CROSS/BLUE SHIELD | Admitting: Family

## 2017-06-16 ENCOUNTER — Telehealth: Payer: Self-pay | Admitting: *Deleted

## 2017-06-16 VITALS — BP 108/57 | HR 87 | Temp 98.0°F | Resp 17

## 2017-06-16 DIAGNOSIS — Z933 Colostomy status: Secondary | ICD-10-CM

## 2017-06-16 DIAGNOSIS — C189 Malignant neoplasm of colon, unspecified: Secondary | ICD-10-CM | POA: Diagnosis not present

## 2017-06-16 DIAGNOSIS — R21 Rash and other nonspecific skin eruption: Secondary | ICD-10-CM

## 2017-06-16 MED ORDER — METHYLPREDNISOLONE 4 MG PO TBPK: ORAL_TABLET | ORAL | 0 refills | Status: DC

## 2017-06-16 MED FILL — METHYLPREDNISOLONE 4 MG TAB: 4 | 6 days supply | Qty: 21 | Fill #0

## 2017-06-16 NOTE — Progress Notes (Signed)
Hematology and Oncology Follow Up Visit  Kajol Crispen [id number] 01-20-1971 47 y.o. 06/16/2017   Principle Diagnosis:  Recurrent colon cancer - HER2 (-)/KRAS mutant/MSI stable/MMR normal/BRAF (wt) - inoperable  Past Therapy:             FOLFOXIRI - s/p cycle #12 -completed on 11/12/2016 Status post exploratory laparotomy with HIPEC - 07/29/2016 Radiation therapy with Xeloda - completed on 06/06/2017  Current Therapy:   Observation   Interim History:  Ms. Wilz is here today with her mother with c/o diffuse maculopapular itchy rash of the legs and arms. This presented last week the night before they flew to [place] to see her daughter compete in a golf tournament. No rash present on her front or back.  She denies using any new soaps or lotions. No new clothing. No one else in the house has any sort of rash.  She has had no fever, chills, n/v, cough, dizziness, SOB, chest pain, palpitations, abdominal pain or changes in bowel or bladder habits.  She has had no issue with her colostomy.  No lymphadenopathy found on exam. No episodes of bleeding, no bruising or petechiae.  No tenderness, numbness or tingling in her extremities. No c/o pain at this time.   She has maintained a good appetite and is staying well hydrated. Her weight is stable.   ECOG Performance Status: 1 - Symptomatic but completely ambulatory  Medications:  Allergies as of 06/16/2017      Reactions   Bactrim [sulfamethoxazole-trimethoprim]    Oxaprozin Hives   REACTION: Hives   Penicillins Hives   REACTION: Hives Has patient had a PCN reaction causing immediate rash, facial/tongue/throat swelling, SOB or lightheadedness with hypotension: no Has patient had a PCN reaction causing severe rash involving mucus membranes or skin necrosis: {no Has patient had a PCN reaction that required hospitalization no Has patient had a PCN reaction occurring within the last 10 years: no If all of the above answers are "NO",  then may proceed with Cephalosporin use.   Sulfonamide Derivatives Swelling   Massive extremity swelling       Medication List        Accurate as of 06/16/17  1:21 PM. Always use your most recent med list.          acetaminophen 500 MG tablet Commonly known as:  TYLENOL Take 1,000 mg by mouth every 8 (eight) hours as needed for moderate pain or headache.   capecitabine 500 MG tablet Commonly known as:  XELODA TAKE 3 TABLETS (1,500 MG TOTAL) BY MOUTH TWO TIMES DAILY AFTER A MEAL. TAKE MONDAY THROUGH SATURDAY, TAKE SUNDAYS OFF   ciprofloxacin 500 MG tablet Commonly known as:  CIPRO Take 1 tablet (500 mg total) by mouth 2 (two) times daily. For 3 days   fexofenadine 180 MG tablet Commonly known as:  ALLEGRA Take 180 mg by mouth daily as needed for allergies.   fluticasone 50 MCG/ACT nasal spray Commonly known as:  FLONASE Place 1 spray into both nostrils 2 (two) times daily.   morphine 15 MG tablet Commonly known as:  MSIR Take 0.5 tablets (7.5 mg total) by mouth every 4 (four) hours as needed for severe pain.   morphine 30 MG 12 hr tablet Commonly known as:  MS CONTIN Take 1 tablet (30 mg total) by mouth every 12 (twelve) hours.   phenazopyridine 100 MG tablet Commonly known as:  PYRIDIUM Take 1 tablet (100 mg total) by mouth 3 (three) times daily as needed for pain.  traMADol 50 MG tablet Commonly known as:  ULTRAM Take 1 tablet (50 mg total) by mouth every 6 (six) hours as needed.       Allergies:  Allergies  Allergen Reactions  . Bactrim [Sulfamethoxazole-Trimethoprim]   . Oxaprozin Hives    REACTION: Hives  . Penicillins Hives    REACTION: Hives Has patient had a PCN reaction causing immediate rash, facial/tongue/throat swelling, SOB or lightheadedness with hypotension: no Has patient had a PCN reaction causing severe rash involving mucus membranes or skin necrosis: {no Has patient had a PCN reaction that required hospitalization no Has patient had a  PCN reaction occurring within the last 10 years: no If all of the above answers are "NO", then may proceed with Cephalosporin use.  . Sulfonamide Derivatives Swelling    Massive extremity swelling     Past Medical History, Surgical history, Social history, and Family History were reviewed and updated.  Review of Systems: All other 10 point review of systems is negative.   Physical Exam:  vitals were not taken for this visit.   Wt Readings from Last 3 Encounters:  06/09/17 151 lb 4 oz (68.6 kg)  05/22/17 149 lb 12 oz (67.9 kg)  04/23/17 147 lb 3.2 oz (66.8 kg)    Ocular: Sclerae unicteric, pupils equal, round and reactive to light Ear-nose-throat: Oropharynx clear, dentition fair Lymphatic: No cervical, supraclavicular or axillary adenopathy Lungs no rales or rhonchi, good excursion bilaterally Heart regular rate and rhythm, no murmur appreciated Abd soft, nontender, positive bowel sounds, no liver or spleen tip palpated on exam, no fluid wave  MSK no focal spinal tenderness, no joint edema Neuro: non-focal, well-oriented, appropriate affect Breasts: Deferred   Lab Results  Component Value Date   WBC 5.0 06/09/2017   HGB 12.2 03/27/2017   HCT 36.4 06/09/2017   MCV 93.6 06/09/2017   PLT 206 06/09/2017   Lab Results  Component Value Date   FERRITIN 107 03/27/2017   IRON 31 (L) 03/27/2017   TIBC 285 03/27/2017   UIBC 254 03/27/2017   IRONPCTSAT 11 (L) 03/27/2017   Lab Results  Component Value Date   RETICCTPCT 0.8 02/12/2015   RBC 3.89 06/09/2017   No results found for: KPAFRELGTCHN, LAMBDASER, KAPLAMBRATIO No results found for: Kandis Cocking, IGMSERUM No results found for: Odetta Pink, SPEI   Chemistry      Component Value Date/Time   NA 142 06/09/2017 1050   NA 145 03/27/2017 1101   NA 141 07/30/2016 0750   K 3.8 06/09/2017 1050   K 4.5 03/27/2017 1101   K 4.2 07/30/2016 0750   CL 106 06/09/2017 1050     CL 103 03/27/2017 1101   CO2 28 06/09/2017 1050   CO2 28 03/27/2017 1101   CO2 26 07/30/2016 0750   BUN 14 06/09/2017 1050   BUN 12 03/27/2017 1101   BUN 12.1 07/30/2016 0750   CREATININE 0.90 06/09/2017 1050   CREATININE 0.9 03/27/2017 1101   CREATININE 0.7 07/30/2016 0750      Component Value Date/Time   CALCIUM 9.5 06/09/2017 1050   CALCIUM 9.6 03/27/2017 1101   CALCIUM 9.5 07/30/2016 0750   ALKPHOS 83 06/09/2017 1050   ALKPHOS 101 (H) 03/27/2017 1101   ALKPHOS 72 07/30/2016 0750   AST 27 06/09/2017 1050   AST 22 07/30/2016 0750   ALT 39 06/09/2017 1050   ALT 20 03/27/2017 1101   ALT 22 07/30/2016 0750   BILITOT 0.8 06/09/2017 1050  BILITOT 0.54 07/30/2016 0750      Impression and Plan: Ms. Poulter is a very pleasant 47 yo caucasian female with recurrent colon cancer, HER2 (-)/KRAS (+)/MSI stable/MMR normal/BRAF (-). She is now off of Xeloda and has developed a diffuse maculopapular itchy rash of her legs and arms.  She has tried benadryl with no relief. We will have her start a Medrol dose pack today. If she has no improvement by Wednesday she will let us know and then we can refer to Dermatology for biopsy.  She goes to [facility] next week for further work up.  She will contact our office with any questions or concerns. We can certainly see her any time she needs [place].   Laverna Peace, NP 3/25/20191:21 PM

## 2017-06-16 NOTE — Telephone Encounter (Signed)
Patient c/o hives to BLE. She states it started Wednesday and has gotten progressively worse. She is no longer taking Xeloda. She'd like to know what to do.  Spoke with Dr Marin Olp. He would like patient to come in for rash to be assessed.   Patient aware of appointment today.

## 2017-06-18 ENCOUNTER — Encounter: Payer: Self-pay | Admitting: Genetic Counselor

## 2017-06-18 ENCOUNTER — Other Ambulatory Visit: Payer: Self-pay | Admitting: Family

## 2017-06-18 DIAGNOSIS — C184 Malignant neoplasm of transverse colon: Secondary | ICD-10-CM

## 2017-06-23 DIAGNOSIS — C786 Secondary malignant neoplasm of retroperitoneum and peritoneum: Secondary | ICD-10-CM | POA: Diagnosis not present

## 2017-06-23 DIAGNOSIS — C20 Malignant neoplasm of rectum: Secondary | ICD-10-CM | POA: Diagnosis not present

## 2017-06-23 DIAGNOSIS — R918 Other nonspecific abnormal finding of lung field: Secondary | ICD-10-CM | POA: Diagnosis not present

## 2017-06-27 DIAGNOSIS — Z85038 Personal history of other malignant neoplasm of large intestine: Secondary | ICD-10-CM | POA: Diagnosis not present

## 2017-06-27 DIAGNOSIS — Z933 Colostomy status: Secondary | ICD-10-CM | POA: Diagnosis not present

## 2017-06-30 DIAGNOSIS — Z923 Personal history of irradiation: Secondary | ICD-10-CM | POA: Diagnosis not present

## 2017-06-30 DIAGNOSIS — Z6825 Body mass index (BMI) 25.0-25.9, adult: Secondary | ICD-10-CM | POA: Diagnosis not present

## 2017-06-30 DIAGNOSIS — C19 Malignant neoplasm of rectosigmoid junction: Secondary | ICD-10-CM | POA: Diagnosis not present

## 2017-06-30 DIAGNOSIS — C189 Malignant neoplasm of colon, unspecified: Secondary | ICD-10-CM | POA: Diagnosis not present

## 2017-06-30 DIAGNOSIS — C786 Secondary malignant neoplasm of retroperitoneum and peritoneum: Secondary | ICD-10-CM | POA: Diagnosis not present

## 2017-06-30 DIAGNOSIS — R590 Localized enlarged lymph nodes: Secondary | ICD-10-CM | POA: Diagnosis not present

## 2017-06-30 DIAGNOSIS — M79661 Pain in right lower leg: Secondary | ICD-10-CM | POA: Diagnosis not present

## 2017-06-30 DIAGNOSIS — Z9221 Personal history of antineoplastic chemotherapy: Secondary | ICD-10-CM | POA: Diagnosis not present

## 2017-06-30 DIAGNOSIS — M7989 Other specified soft tissue disorders: Secondary | ICD-10-CM | POA: Diagnosis not present

## 2017-06-30 DIAGNOSIS — Z933 Colostomy status: Secondary | ICD-10-CM | POA: Diagnosis not present

## 2017-07-01 ENCOUNTER — Inpatient Hospital Stay: Payer: BLUE CROSS/BLUE SHIELD

## 2017-07-01 ENCOUNTER — Encounter: Payer: Self-pay | Admitting: Hematology & Oncology

## 2017-07-01 ENCOUNTER — Inpatient Hospital Stay: Payer: BLUE CROSS/BLUE SHIELD | Attending: Radiation Oncology | Admitting: Hematology & Oncology

## 2017-07-01 ENCOUNTER — Other Ambulatory Visit: Payer: Self-pay

## 2017-07-01 ENCOUNTER — Other Ambulatory Visit: Payer: Self-pay | Admitting: *Deleted

## 2017-07-01 VITALS — BP 109/72 | HR 66 | Temp 97.4°F | Wt 152.0 lb

## 2017-07-01 DIAGNOSIS — Z933 Colostomy status: Secondary | ICD-10-CM

## 2017-07-01 DIAGNOSIS — C184 Malignant neoplasm of transverse colon: Secondary | ICD-10-CM

## 2017-07-01 DIAGNOSIS — Z5112 Encounter for antineoplastic immunotherapy: Secondary | ICD-10-CM | POA: Insufficient documentation

## 2017-07-01 DIAGNOSIS — Z5111 Encounter for antineoplastic chemotherapy: Secondary | ICD-10-CM | POA: Insufficient documentation

## 2017-07-01 DIAGNOSIS — C189 Malignant neoplasm of colon, unspecified: Secondary | ICD-10-CM | POA: Insufficient documentation

## 2017-07-01 LAB — IRON AND TIBC
IRON: 52 ug/dL (ref 41–142)
SATURATION RATIOS: 20 % — AB (ref 21–57)
TIBC: 266 ug/dL (ref 236–444)
UIBC: 214 ug/dL

## 2017-07-01 LAB — CMP (CANCER CENTER ONLY)
ALT: 16 U/L (ref 0–55)
AST: 20 U/L (ref 5–34)
Albumin: 3.4 g/dL — ABNORMAL LOW (ref 3.5–5.0)
Alkaline Phosphatase: 89 U/L (ref 40–150)
Anion gap: 9 (ref 3–11)
BUN: 14 mg/dL (ref 7–26)
CHLORIDE: 107 mmol/L (ref 98–109)
CO2: 24 mmol/L (ref 22–29)
CREATININE: 0.71 mg/dL (ref 0.60–1.10)
Calcium: 9.4 mg/dL (ref 8.4–10.4)
Glucose, Bld: 82 mg/dL (ref 70–140)
POTASSIUM: 4.3 mmol/L (ref 3.5–5.1)
SODIUM: 140 mmol/L (ref 136–145)
Total Bilirubin: 0.4 mg/dL (ref 0.2–1.2)
Total Protein: 7 g/dL (ref 6.4–8.3)

## 2017-07-01 LAB — CBC WITH DIFFERENTIAL (CANCER CENTER ONLY)
BASOS PCT: 0 %
Basophils Absolute: 0 10*3/uL (ref 0.0–0.1)
EOS ABS: 0.1 10*3/uL (ref 0.0–0.5)
EOS PCT: 2 %
HCT: 35.8 % (ref 34.8–46.6)
Hemoglobin: 12.2 g/dL (ref 11.6–15.9)
Lymphocytes Relative: 8 %
Lymphs Abs: 0.5 10*3/uL — ABNORMAL LOW (ref 0.9–3.3)
MCH: 31.9 pg (ref 26.0–34.0)
MCHC: 34.1 g/dL (ref 32.0–36.0)
MCV: 93.5 fL (ref 81.0–101.0)
MONO ABS: 0.6 10*3/uL (ref 0.1–0.9)
Monocytes Relative: 9 %
Neutro Abs: 5.3 10*3/uL (ref 1.5–6.5)
Neutrophils Relative %: 81 %
PLATELETS: 276 10*3/uL (ref 145–400)
RBC: 3.83 MIL/uL (ref 3.70–5.32)
RDW: 16.3 % — AB (ref 11.1–15.7)
WBC: 6.6 10*3/uL (ref 3.9–10.0)

## 2017-07-01 LAB — CEA (IN HOUSE-CHCC): CEA (CHCC-IN HOUSE): 1.91 ng/mL (ref 0.00–5.00)

## 2017-07-01 LAB — FERRITIN: FERRITIN: 253 ng/mL (ref 9–269)

## 2017-07-01 MED ORDER — HEPARIN SOD (PORK) LOCK FLUSH 100 UNIT/ML IV SOLN
500.0000 [IU] | Freq: Once | INTRAVENOUS | Status: AC
  Administered 2017-07-01: 500 [IU] via INTRAVENOUS
  Filled 2017-07-01: qty 5

## 2017-07-01 MED ORDER — SODIUM CHLORIDE 0.9% FLUSH
10.0000 mL | INTRAVENOUS | Status: DC | PRN
  Administered 2017-07-01: 10 mL via INTRAVENOUS
  Filled 2017-07-01: qty 10

## 2017-07-01 NOTE — Progress Notes (Signed)
Hematology and Oncology Follow Up Visit  Carla Mills [id number] 1970-10-22 47 y.o. 07/01/2017   Principle Diagnosis:  Recurrent colon cancer - HER2 (-)/KRAS mutant/MSI stable/MMR normal/BRAF (wt) - inoperable  Past Therapy:   FOLFOXIRI - s/p cycle #12 - completed on 11/12/2016 Status post exploratory laparotomy with HIPEC - 07/29/2016  Current Therapy: Radiation therapy with Xeloda - completed on 06/06/2017 FOLFOXIRI/Avastin - start cycle #1 on 07/15/2017   Interim History:  Carla Mills is back for follow-up.  I probably will use FOLFOXIRI as the chemotherapy "backbone".    She and her family want to take spring break I think to [place] for a few days.  From my perspective I do not see any problems with her doing this.   She actually looks good.  She feels good.  She is not hurting.  There is no bleeding.  She has had no problems with nausea or vomiting.  She has the colostomy that is working pretty nicely.  There is been no fever.  She has had no leg swelling.  She has had no rashes.  She was seen at [facility].  They recommended systemic chemotherapy.  There really is no protocol that she would qualify for yet.  She has never received Avastin.  I think now would be a good time to add Avastin.  I would like to get a PET scan on her.  I think that this would be very reasonable.  In the past, her tumor has always been very PET avid.  Unfortunately, her CEA has really never been elevated.  As such, I am unsure how much we can use this.  She is eating pretty well.  She is having no problems with dysphagia or odynophagia.  There is no cough.  Overall, I would say that her performance status is ECOG 0.   Medications:  Allergies as of 07/01/2017      Reactions   Bactrim [sulfamethoxazole-trimethoprim]    Oxaprozin Hives   REACTION: Hives   Penicillins Hives   REACTION: Hives Has patient had a PCN reaction causing immediate rash, facial/tongue/throat swelling, SOB or  lightheadedness with hypotension: no Has patient had a PCN reaction causing severe rash involving mucus membranes or skin necrosis: {no Has patient had a PCN reaction that required hospitalization no Has patient had a PCN reaction occurring within the last 10 years: no If all of the above answers are "NO", then may proceed with Cephalosporin use.   Sulfonamide Derivatives Swelling   Massive extremity swelling       Medication List        Accurate as of 07/01/17  4:56 PM. Always use your most recent med list.          acetaminophen 500 MG tablet Commonly known as:  TYLENOL Take 1,000 mg by mouth every 8 (eight) hours as needed for moderate pain or headache.   capecitabine 500 MG tablet Commonly known as:  XELODA TAKE 3 TABLETS (1,500 MG TOTAL) BY MOUTH TWO TIMES DAILY AFTER A MEAL. TAKE MONDAY THROUGH SATURDAY, TAKE SUNDAYS OFF   ciprofloxacin 500 MG tablet Commonly known as:  CIPRO Take 1 tablet (500 mg total) by mouth 2 (two) times daily. For 3 days   fexofenadine 180 MG tablet Commonly known as:  ALLEGRA Take 180 mg by mouth daily as needed for allergies.   fluticasone 50 MCG/ACT nasal spray Commonly known as:  FLONASE Place 1 spray into both nostrils 2 (two) times daily.   methylPREDNISolone 4 MG Tbpk  tablet Commonly known as:  MEDROL DOSEPAK Take as directed on package.   morphine 15 MG tablet Commonly known as:  MSIR Take 0.5 tablets (7.5 mg total) by mouth every 4 (four) hours as needed for severe pain.   morphine 30 MG 12 hr tablet Commonly known as:  MS CONTIN Take 1 tablet (30 mg total) by mouth every 12 (twelve) hours.   phenazopyridine 100 MG tablet Commonly known as:  PYRIDIUM Take 1 tablet (100 mg total) by mouth 3 (three) times daily as needed for pain.   traMADol 50 MG tablet Commonly known as:  ULTRAM Take 1 tablet (50 mg total) by mouth every 6 (six) hours as needed.       Allergies:  Allergies  Allergen Reactions  . Bactrim  [Sulfamethoxazole-Trimethoprim]   . Oxaprozin Hives    REACTION: Hives  . Penicillins Hives    REACTION: Hives Has patient had a PCN reaction causing immediate rash, facial/tongue/throat swelling, SOB or lightheadedness with hypotension: no Has patient had a PCN reaction causing severe rash involving mucus membranes or skin necrosis: {no Has patient had a PCN reaction that required hospitalization no Has patient had a PCN reaction occurring within the last 10 years: no If all of the above answers are "NO", then may proceed with Cephalosporin use.  . Sulfonamide Derivatives Swelling    Massive extremity swelling     Past Medical History, Surgical history, Social history, and Family History were reviewed and updated.  Review of Systems: Review of Systems  Constitutional: Negative.   HENT: Negative.   Eyes: Negative.   Respiratory: Negative.   Cardiovascular: Negative.   Gastrointestinal: Negative.   Genitourinary: Negative.   Musculoskeletal: Negative.   Skin: Negative.   Neurological: Negative.   Endo/Heme/Allergies: Negative.   Psychiatric/Behavioral: Negative.   All other systems reviewed and are negative.   Physical Exam:  weight is 152 lb (68.9 kg). Her oral temperature is 97.4 F (36.3 C) (abnormal). Her blood pressure is 109/72 and her pulse is 66. Her oxygen saturation is 100%.   Wt Readings from Last 3 Encounters:  07/01/17 152 lb (68.9 kg)  06/09/17 151 lb 4 oz (68.6 kg)  05/22/17 149 lb 12 oz (67.9 kg)    Physical Exam  Constitutional: She is oriented to person, place, and time.  HENT:  Head: Normocephalic and atraumatic.  Mouth/Throat: Oropharynx is clear and moist.  Eyes: Pupils are equal, round, and reactive to light. EOM are normal.  Neck: Normal range of motion.  Cardiovascular: Normal rate, regular rhythm and normal heart sounds.  Pulmonary/Chest: Effort normal and breath sounds normal.  Abdominal: Soft. Bowel sounds are normal.  She has a colostomy  in the lower abdomen.  She has the fistula which is about 3 mm in size at the caudal end of her laparotomy scar.  The laparotomy scar is well  Musculoskeletal: Normal range of motion. She exhibits no edema, tenderness or deformity.  Lymphadenopathy:    She has no cervical adenopathy.  Neurological: She is alert and oriented to person, place, and time.  Skin: Skin is warm and dry. No rash noted. No erythema.  Psychiatric: She has a normal mood and affect. Her behavior is normal. Judgment and thought content normal.  Vitals reviewed.    Lab Results  Component Value Date   WBC 6.6 07/01/2017   HGB 12.2 03/27/2017   HCT 35.8 07/01/2017   MCV 93.5 07/01/2017   PLT 276 07/01/2017   Lab Results  Component Value Date  FERRITIN 253 07/01/2017   IRON 52 07/01/2017   TIBC 266 07/01/2017   UIBC 214 07/01/2017   IRONPCTSAT 20 (L) 07/01/2017   Lab Results  Component Value Date   RETICCTPCT 0.8 02/12/2015   RBC 3.83 07/01/2017   No results found for: KPAFRELGTCHN, LAMBDASER, KAPLAMBRATIO No results found for: IGGSERUM, IGA, IGMSERUM No results found for: Odetta Pink, SPEI   Chemistry      Component Value Date/Time   NA 140 07/01/2017 1022   NA 145 03/27/2017 1101   NA 141 07/30/2016 0750   K 4.3 07/01/2017 1022   K 4.5 03/27/2017 1101   K 4.2 07/30/2016 0750   CL 107 07/01/2017 1022   CL 103 03/27/2017 1101   CO2 24 07/01/2017 1022   CO2 28 03/27/2017 1101   CO2 26 07/30/2016 0750   BUN 14 07/01/2017 1022   BUN 12 03/27/2017 1101   BUN 12.1 07/30/2016 0750   CREATININE 0.71 07/01/2017 1022   CREATININE 0.9 03/27/2017 1101   CREATININE 0.7 07/30/2016 0750      Component Value Date/Time   CALCIUM 9.4 07/01/2017 1022   CALCIUM 9.6 03/27/2017 1101   CALCIUM 9.5 07/30/2016 0750   ALKPHOS 89 07/01/2017 1022   ALKPHOS 101 (H) 03/27/2017 1101   ALKPHOS 72 07/30/2016 0750   AST 20 07/01/2017 1022   AST 22 07/30/2016 0750     ALT 16 07/01/2017 1022   ALT 20 03/27/2017 1101   ALT 22 07/30/2016 0750   BILITOT 0.4 07/01/2017 1022   BILITOT 0.54 07/30/2016 0750      Impression and Plan: Ms. Meriweather is a very pleasant 47 yo caucasian female with recurrent colon cancer - HER2 (-)/KRAS (+)/MSI stable/MMR normal/BRAF -.   I am sure that she will have a good time in [place] with her family.  I think that FOLFOXIRI with Avastin is a good choice for her.  She is aware is responded to 5-FU based therapy in the past.  We will plan to get started after she returns from her vacation.  I will plan to give her 4 cycles of treatment and then we will repeat a PET scan.  I spent about 45 minutes with she and her husband.  We went over the chemotherapy side effects.  I went over the last side effects.  I really do not think that the Avastin should cause her much problems.  Over half the time was spent face-to-face.  Volanda Napoleon, MD 4/9/20194:56 PM

## 2017-07-01 NOTE — Patient Instructions (Signed)
Implanted Port Insertion, Care After °This sheet gives you information about how to care for yourself after your procedure. Your health care provider may also give you more specific instructions. If you have problems or questions, contact your health care provider. °What can I expect after the procedure? °After your procedure, it is common to have: °· Discomfort at the port insertion site. °· Bruising on the skin over the port. This should improve over 3-4 days. ° °Follow these instructions at home: °Port care °· After your port is placed, you will get a manufacturer's information card. The card has information about your port. Keep this card with you at all times. °· Take care of the port as told by your health care provider. Ask your health care provider if you or a family member can get training for taking care of the port at home. A home health care nurse may also take care of the port. °· Make sure to remember what type of port you have. °Incision care °· Follow instructions from your health care provider about how to take care of your port insertion site. Make sure you: °? Wash your hands with soap and water before you change your bandage (dressing). If soap and water are not available, use hand sanitizer. °? Change your dressing as told by your health care provider. °? Leave stitches (sutures), skin glue, or adhesive strips in place. These skin closures may need to stay in place for 2 weeks or longer. If adhesive strip edges start to loosen and curl up, you may trim the loose edges. Do not remove adhesive strips completely unless your health care provider tells you to do that. °· Check your port insertion site every day for signs of infection. Check for: °? More redness, swelling, or pain. °? More fluid or blood. °? Warmth. °? Pus or a bad smell. °General instructions °· Do not take baths, swim, or use a hot tub until your health care provider approves. °· Do not lift anything that is heavier than 10 lb (4.5  kg) for a week, or as told by your health care provider. °· Ask your health care provider when it is okay to: °? Return to work or school. °? Resume usual physical activities or sports. °· Do not drive for 24 hours if you were given a medicine to help you relax (sedative). °· Take over-the-counter and prescription medicines only as told by your health care provider. °· Wear a medical alert bracelet in case of an emergency. This will tell any health care providers that you have a port. °· Keep all follow-up visits as told by your health care provider. This is important. °Contact a health care provider if: °· You cannot flush your port with saline as directed, or you cannot draw blood from the port. °· You have a fever or chills. °· You have more redness, swelling, or pain around your port insertion site. °· You have more fluid or blood coming from your port insertion site. °· Your port insertion site feels warm to the touch. °· You have pus or a bad smell coming from the port insertion site. °Get help right away if: °· You have chest pain or shortness of breath. °· You have bleeding from your port that you cannot control. °Summary °· Take care of the port as told by your health care provider. °· Change your dressing as told by your health care provider. °· Keep all follow-up visits as told by your health care provider. °  This information is not intended to replace advice given to you by your health care provider. Make sure you discuss any questions you have with your health care provider. °Document Released: 12/30/2012 Document Revised: 01/31/2016 Document Reviewed: 01/31/2016 °Elsevier Interactive Patient Education © 2017 [employer] ° °

## 2017-07-07 ENCOUNTER — Encounter: Payer: Self-pay | Admitting: Radiation Oncology

## 2017-07-07 ENCOUNTER — Other Ambulatory Visit: Payer: Self-pay

## 2017-07-07 ENCOUNTER — Ambulatory Visit
Admission: RE | Admit: 2017-07-07 | Discharge: 2017-07-07 | Disposition: A | Discharge status: Home / Self Care | Payer: BLUE CROSS/BLUE SHIELD | Source: Ambulatory Visit | Attending: Radiation Oncology | Admitting: Radiation Oncology

## 2017-07-07 VITALS — BP 122/69 | HR 84 | Temp 97.8°F | Resp 18 | Wt 152.2 lb

## 2017-07-07 DIAGNOSIS — C772 Secondary and unspecified malignant neoplasm of intra-abdominal lymph nodes: Secondary | ICD-10-CM | POA: Diagnosis not present

## 2017-07-07 DIAGNOSIS — Z79899 Other long term (current) drug therapy: Secondary | ICD-10-CM | POA: Insufficient documentation

## 2017-07-07 DIAGNOSIS — Z933 Colostomy status: Secondary | ICD-10-CM | POA: Insufficient documentation

## 2017-07-07 DIAGNOSIS — C187 Malignant neoplasm of sigmoid colon: Secondary | ICD-10-CM | POA: Insufficient documentation

## 2017-07-07 DIAGNOSIS — Z923 Personal history of irradiation: Secondary | ICD-10-CM | POA: Insufficient documentation

## 2017-07-07 NOTE — Progress Notes (Signed)
Adriann Thau is here today for 1 month follow up from colon radiation.  Patient denies having any pain.  Colostomy in place.  Patient denies having any diarrhea/constipation, bowels are consistent, daily, does report having some leakage but due to surgical design of ostomy.  Patient denies having any nausea or vomiting.  Patient denies having any excessive fatigue.  Reports appetite is well.  Vitals:   07/07/17 1451  BP: 122/69  Pulse: 84  Resp: 18  Temp: 97.8 F (36.6 C)  TempSrc: Oral  Weight: 152 lb 3.2 oz (69 kg)   Wt Readings from Last 3 Encounters:  07/07/17 152 lb 3.2 oz (69 kg)  07/01/17 152 lb (68.9 kg)  06/09/17 151 lb 4 oz (68.6 kg)

## 2017-07-07 NOTE — Progress Notes (Signed)
Radiation Oncology         [phone]) [phone] ________________________________  Name: Carla Mills MRN: [id number]  Date: 07/07/2017  DOB: [birth year]  Follow-Up Visit Note  CC: Patient, No Pcp Per  Volanda Napoleon, MD    ICD-10-CM   1. 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 She denies any pain at this time. A colostomy is in place. She denies diarrhea/constipation. She notes daily, consistent bowel movements. She notes some leakage but due to surgical design of ostomy. Patient denies nausea/vomting. She also denies fatigue. She reports a good appetite. She notes she will not undergo surgery but she will begin chemotherapy with FOLFOXIRI/ Avastin on 07/15/17.                    ALLERGIES:  is allergic to capecitabine; bactrim [sulfamethoxazole-trimethoprim]; oxaprozin; penicillins; and sulfonamide derivatives.  Meds: Current Outpatient Medications  Medication Sig Dispense Refill  . acetaminophen (TYLENOL) 500 MG tablet Take 1,000 mg by mouth every 8 (eight) hours as needed for moderate pain or headache.     . fexofenadine (ALLEGRA) 180 MG tablet Take 180 mg by mouth daily as needed for allergies.     . fluticasone (FLONASE) 50 MCG/ACT nasal spray Place 1 spray into both nostrils 2 (two) times daily.     . capecitabine (XELODA) 500 MG tablet TAKE 3 TABLETS (1,500 MG TOTAL) BY MOUTH TWO TIMES DAILY AFTER A MEAL. TAKE MONDAY THROUGH SATURDAY, TAKE SUNDAYS OFF 156 tablet 2  . ciprofloxacin (CIPRO) 500 MG tablet Take 1 tablet (500 mg total) by mouth 2 (two) times daily. For 3 days 6 tablet 1  . methylPREDNISolone (MEDROL DOSEPAK) 4 MG TBPK tablet Take as directed on package. 21 tablet 0  . morphine (MS CONTIN) 30 MG 12 hr  tablet Take 1 tablet (30 mg total) by mouth every 12 (twelve) hours. (Patient not taking: Reported on 07/07/2017) 30 tablet 0  . morphine (MSIR) 15 MG tablet Take 0.5 tablets (7.5 mg total) by mouth every 4 (four) hours as needed for severe pain. (Patient not taking: Reported on 07/07/2017) 30 tablet 0  . phenazopyridine (PYRIDIUM) 100 MG tablet Take 1 tablet (100 mg total) by mouth 3 (three) times daily as needed for pain. (Patient not taking: Reported on 07/07/2017) 30 tablet 1  . traMADol (ULTRAM) 50 MG tablet Take 1 tablet (50 mg total) by mouth every 6 (six) hours as needed. (Patient not taking: Reported on 07/07/2017) 90 tablet 0   No current facility-administered medications for this encounter.    Facility-Administered Medications Ordered in Other Encounters  Medication Dose Route Frequency Provider Last Rate Last Dose  . dextrose 5 % solution   Intravenous Continuous Volanda Napoleon, MD   Stopped at 04/09/16 1418    Physical Findings: The patient is in no acute distress. Patient is alert and oriented.  weight is 152 lb 3.2 oz (69 kg). Her oral temperature is 97.8 F (36.6 C). Her blood pressure is 122/69 and her pulse is 84. Her respiration is 18. .  No significant changes. Lungs are clear to auscultation bilaterally. Heart has regular rate and rhythm. No palpable cervical, supraclavicular, or axillary adenopathy. Abdomen soft, non-tender, normal bowel sounds.  Lab Findings:  Lab Results  Component Value Date   WBC 6.6 07/01/2017   HGB 12.2 03/27/2017   HCT 35.8 07/01/2017   MCV 93.5 07/01/2017   PLT 276 07/01/2017    Radiographic Findings: No results found.  Impression:  The patient is recovering from the effects of radiation. Radiation treatment to the pelvic region has resulted in good control of the patient's pain. She is currently on no prescription pain medications.  Plan: The patient will follow-up in radiation oncology prn. She will remain under close follow-up with Dr.  Marin Olp. The patient will undergo a PET scan tomorrow and begin systemic chemotherapy with FOLFOXIRI/ Avastin on 07/15/17.   ____________________________________  This document serves as a record of services personally performed by Gery Pray, MD. It was created on his behalf by Bethann Humble, a trained medical scribe. The creation of this record is based on the scribe's personal observations and the provider's statements to them. This document has been checked and approved by the attending provider.

## 2017-07-08 ENCOUNTER — Ambulatory Visit (HOSPITAL_COMMUNITY): Payer: BLUE CROSS/BLUE SHIELD

## 2017-07-09 ENCOUNTER — Telehealth: Payer: Self-pay | Admitting: *Deleted

## 2017-07-09 DIAGNOSIS — C189 Malignant neoplasm of colon, unspecified: Secondary | ICD-10-CM

## 2017-07-09 DIAGNOSIS — B349 Viral infection, unspecified: Secondary | ICD-10-CM | POA: Diagnosis not present

## 2017-07-09 MED ORDER — MOXIFLOXACIN HCL 400 MG PO TABS
400.0000 mg | ORAL_TABLET | Freq: Every day | ORAL | 0 refills | Status: DC
Start: 2017-07-09 — End: 2017-08-12

## 2017-07-09 NOTE — Telephone Encounter (Signed)
Patient has a fever of 101. She leaves tomorrow for [employer]. She has no other symptoms. She is currently at an urgent care getting some initial work up.  She'd like to know what she can do.  Spoke with Dr Marin Olp. He will send a script for an antibiotic. Patient is to stay and finish workup with urgent care. If they diagnose her with anything she will call the office back tomorrow before leaving. Patient aware of new prescription. Pharmacy confirmed.

## 2017-07-10 ENCOUNTER — Encounter: Payer: Self-pay | Admitting: Family

## 2017-07-14 ENCOUNTER — Other Ambulatory Visit: Payer: Self-pay | Admitting: *Deleted

## 2017-07-14 DIAGNOSIS — C189 Malignant neoplasm of colon, unspecified: Secondary | ICD-10-CM

## 2017-07-15 ENCOUNTER — Inpatient Hospital Stay: Payer: BLUE CROSS/BLUE SHIELD

## 2017-07-15 ENCOUNTER — Other Ambulatory Visit: Payer: Self-pay | Admitting: *Deleted

## 2017-07-15 VITALS — BP 107/69 | HR 89 | Temp 99.8°F | Resp 17

## 2017-07-15 DIAGNOSIS — C187 Malignant neoplasm of sigmoid colon: Secondary | ICD-10-CM

## 2017-07-15 DIAGNOSIS — C189 Malignant neoplasm of colon, unspecified: Secondary | ICD-10-CM

## 2017-07-15 DIAGNOSIS — Z5112 Encounter for antineoplastic immunotherapy: Secondary | ICD-10-CM | POA: Diagnosis not present

## 2017-07-15 DIAGNOSIS — C772 Secondary and unspecified malignant neoplasm of intra-abdominal lymph nodes: Principal | ICD-10-CM

## 2017-07-15 DIAGNOSIS — Z5111 Encounter for antineoplastic chemotherapy: Secondary | ICD-10-CM | POA: Diagnosis not present

## 2017-07-15 LAB — CBC WITH DIFFERENTIAL (CANCER CENTER ONLY)
BASOS ABS: 0 10*3/uL (ref 0.0–0.1)
BASOS PCT: 0 %
Eosinophils Absolute: 0.1 10*3/uL (ref 0.0–0.5)
Eosinophils Relative: 2 %
HEMATOCRIT: 30 % — AB (ref 34.8–46.6)
HEMOGLOBIN: 10 g/dL — AB (ref 11.6–15.9)
LYMPHS PCT: 7 %
Lymphs Abs: 0.5 10*3/uL — ABNORMAL LOW (ref 0.9–3.3)
MCH: 31.1 pg (ref 26.0–34.0)
MCHC: 33.3 g/dL (ref 32.0–36.0)
MCV: 93.2 fL (ref 81.0–101.0)
MONOS PCT: 10 %
Monocytes Absolute: 0.7 10*3/uL (ref 0.1–0.9)
NEUTROS ABS: 5.9 10*3/uL (ref 1.5–6.5)
NEUTROS PCT: 81 %
Platelet Count: 351 10*3/uL (ref 145–400)
RBC: 3.22 MIL/uL — ABNORMAL LOW (ref 3.70–5.32)
RDW: 15.1 % (ref 11.1–15.7)
WBC Count: 7.2 10*3/uL (ref 3.9–10.0)

## 2017-07-15 LAB — COMPREHENSIVE METABOLIC PANEL
ALT: 36 U/L (ref 10–47)
ANION GAP: 3 — AB (ref 5–15)
AST: 25 U/L (ref 11–38)
Albumin: 3.1 g/dL — ABNORMAL LOW (ref 3.5–5.0)
Alkaline Phosphatase: 359 U/L — ABNORMAL HIGH (ref 26–84)
BUN: 9 mg/dL (ref 7–22)
CALCIUM: 9.1 mg/dL (ref 8.0–10.3)
CO2: 27 mmol/L (ref 18–33)
Chloride: 111 mmol/L — ABNORMAL HIGH (ref 98–108)
Creatinine, Ser: 0.8 mg/dL (ref 0.60–1.20)
Glucose, Bld: 104 mg/dL (ref 73–118)
POTASSIUM: 3.9 mmol/L (ref 3.3–4.7)
Sodium: 141 mmol/L (ref 128–145)
Total Bilirubin: 0.7 mg/dL (ref 0.2–1.6)
Total Protein: 7 g/dL (ref 6.4–8.1)

## 2017-07-15 LAB — TOTAL PROTEIN, URINE DIPSTICK: Protein, ur: NEGATIVE mg/dL

## 2017-07-15 LAB — CEA (IN HOUSE-CHCC): CEA (CHCC-In House): 1.77 ng/mL (ref 0.00–5.00)

## 2017-07-15 MED ORDER — DEXAMETHASONE SODIUM PHOSPHATE 10 MG/ML IJ SOLN
INTRAMUSCULAR | Status: AC
Start: 2017-07-15 — End: 2017-07-15
  Filled 2017-07-15: qty 1

## 2017-07-15 MED ORDER — DEXAMETHASONE 4 MG PO TABS: 8.0000 mg | ORAL_TABLET | Freq: Every day | ORAL | 5 refills | Status: DC

## 2017-07-15 MED ORDER — DEXAMETHASONE SODIUM PHOSPHATE 10 MG/ML IJ SOLN
10.0000 mg | Freq: Once | INTRAMUSCULAR | Status: AC
  Administered 2017-07-15: 10 mg via INTRAVENOUS

## 2017-07-15 MED ORDER — SODIUM CHLORIDE 0.9 % IV SOLN
5.0000 mg/kg | Freq: Once | INTRAVENOUS | Status: AC
  Administered 2017-07-15: 350 mg via INTRAVENOUS
  Filled 2017-07-15: qty 14

## 2017-07-15 MED ORDER — SODIUM CHLORIDE 0.9% FLUSH
10.0000 mL | INTRAVENOUS | Status: DC | PRN
  Filled 2017-07-15: qty 10

## 2017-07-15 MED ORDER — OXALIPLATIN CHEMO INJECTION 100 MG/20ML
76.5000 mg/m2 | Freq: Once | INTRAVENOUS | Status: AC
  Administered 2017-07-15: 135 mg via INTRAVENOUS
  Filled 2017-07-15: qty 20

## 2017-07-15 MED ORDER — PROCHLORPERAZINE MALEATE 10 MG PO TABS: 10.0000 mg | ORAL_TABLET | Freq: Four times a day (QID) | ORAL | 1 refills | Status: DC | PRN

## 2017-07-15 MED ORDER — DEXTROSE 5 % IV SOLN
Freq: Once | INTRAVENOUS | Status: AC
  Administered 2017-07-15: 09:00:00 via INTRAVENOUS

## 2017-07-15 MED ORDER — LEUCOVORIN CALCIUM INJECTION 350 MG
200.0000 mg/m2 | Freq: Once | INTRAVENOUS | Status: AC
  Administered 2017-07-15: 350 mg via INTRAVENOUS
  Filled 2017-07-15: qty 17.5

## 2017-07-15 MED ORDER — PALONOSETRON HCL INJECTION 0.25 MG/5ML
0.2500 mg | Freq: Once | INTRAVENOUS | Status: AC
  Administered 2017-07-15: 0.25 mg via INTRAVENOUS

## 2017-07-15 MED ORDER — PROCHLORPERAZINE MALEATE 10 MG PO TABS
10.0000 mg | ORAL_TABLET | Freq: Four times a day (QID) | ORAL | 1 refills | Status: DC | PRN
Start: 2017-07-15 — End: 2018-03-03

## 2017-07-15 MED ORDER — ONDANSETRON HCL 8 MG PO TABS: 8.0000 mg | ORAL_TABLET | Freq: Two times a day (BID) | ORAL | 1 refills | Status: DC | PRN

## 2017-07-15 MED ORDER — FLUOROURACIL CHEMO INJECTION 5 GM/100ML
2900.0000 mg/m2 | INTRAVENOUS | Status: DC
  Administered 2017-07-15: 5100 mg via INTRAVENOUS
  Filled 2017-07-15: qty 102

## 2017-07-15 MED ORDER — DEXAMETHASONE 4 MG PO TABS
8.0000 mg | ORAL_TABLET | Freq: Every day | ORAL | 5 refills | Status: DC
Start: 2017-07-15 — End: 2017-07-15

## 2017-07-15 MED ORDER — HEPARIN SOD (PORK) LOCK FLUSH 100 UNIT/ML IV SOLN
500.0000 [IU] | Freq: Once | INTRAVENOUS | Status: DC | PRN
  Filled 2017-07-15: qty 5

## 2017-07-15 MED ORDER — IRINOTECAN HCL CHEMO INJECTION 100 MG/5ML
132.0000 mg/m2 | Freq: Once | INTRAVENOUS | Status: AC
  Administered 2017-07-15: 240 mg via INTRAVENOUS
  Filled 2017-07-15: qty 10

## 2017-07-15 MED ORDER — SODIUM CHLORIDE 0.9 % IV SOLN
Freq: Once | INTRAVENOUS | Status: AC
  Administered 2017-07-15: 10:00:00 via INTRAVENOUS

## 2017-07-15 MED ORDER — PALONOSETRON HCL INJECTION 0.25 MG/5ML
INTRAVENOUS | Status: AC
  Filled 2017-07-15: qty 5

## 2017-07-15 MED ORDER — LORAZEPAM 1 MG PO TABS: 1.0000 mg | ORAL_TABLET | Freq: Four times a day (QID) | ORAL | 0 refills | Status: DC | PRN

## 2017-07-15 MED FILL — LORazepam 1 MG TABS: 1 | 7 days supply | Qty: 30 | Fill #0

## 2017-07-15 MED FILL — DEXAMETHASONE 4 MG TABLET: 4 | 4 days supply | Qty: 8 | Fill #0

## 2017-07-15 MED FILL — PROCHLORPERAZINE 10 MG TAB: 10 | 7 days supply | Qty: 30 | Fill #0

## 2017-07-15 MED FILL — ONDANSETRON HCL 8 MG TABLET: 8 | 15 days supply | Qty: 30 | Fill #0

## 2017-07-15 NOTE — Patient Instructions (Signed)
Implanted Port Home Guide An implanted port is a type of central line that is placed under the skin. Central lines are used to provide IV access when treatment or nutrition needs to be given through a person's veins. Implanted ports are used for long-term IV access. An implanted port may be placed because:  You need IV medicine that would be irritating to the small veins in your hands or arms.  You need long-term IV medicines, such as antibiotics.  You need IV nutrition for a long period.  You need frequent blood draws for lab tests.  You need dialysis.  Implanted ports are usually placed in the chest area, but they can also be placed in the upper arm, the abdomen, or the leg. An implanted port has two main parts:  Reservoir. The reservoir is round and will appear as a small, raised area under your skin. The reservoir is the part where a needle is inserted to give medicines or draw blood.  Catheter. The catheter is a thin, flexible tube that extends from the reservoir. The catheter is placed into a large vein. Medicine that is inserted into the reservoir goes into the catheter and then into the vein.  How will I care for my incision site? Do not get the incision site wet. Bathe or shower as directed by your health care provider. How is my port accessed? Special steps must be taken to access the port:  Before the port is accessed, a numbing cream can be placed on the skin. This helps numb the skin over the port site.  Your health care provider uses a sterile technique to access the port. ? Your health care provider must put on a mask and sterile gloves. ? The skin over your port is cleaned carefully with an antiseptic and allowed to dry. ? The port is gently pinched between sterile gloves, and a needle is inserted into the port.  Only "non-coring" port needles should be used to access the port. Once the port is accessed, a blood return should be checked. This helps ensure that the port  is in the vein and is not clogged.  If your port needs to remain accessed for a constant infusion, a clear (transparent) bandage will be placed over the needle site. The bandage and needle will need to be changed every week, or as directed by your health care provider.  Keep the bandage covering the needle clean and dry. Do not get it wet. Follow your health care provider's instructions on how to take a shower or bath while the port is accessed.  If your port does not need to stay accessed, no bandage is needed over the port.  What is flushing? Flushing helps keep the port from getting clogged. Follow your health care provider's instructions on how and when to flush the port. Ports are usually flushed with saline solution or a medicine called heparin. The need for flushing will depend on how the port is used.  If the port is used for intermittent medicines or blood draws, the port will need to be flushed: ? After medicines have been given. ? After blood has been drawn. ? As part of routine maintenance.  If a constant infusion is running, the port may not need to be flushed.  How long will my port stay implanted? The port can stay in for as long as your health care provider thinks it is needed. When it is time for the port to come out, surgery will be   done to remove it. The procedure is similar to the one performed when the port was put in. When should I seek immediate medical care? When you have an implanted port, you should seek immediate medical care if:  You notice a bad smell coming from the incision site.  You have swelling, redness, or drainage at the incision site.  You have more swelling or pain at the port site or the surrounding area.  You have a fever that is not controlled with medicine.  This information is not intended to replace advice given to you by your health care provider. Make sure you discuss any questions you have with your health care provider. Document  Released: 03/11/2005 Document Revised: 08/17/2015 Document Reviewed: 11/16/2012 Elsevier Interactive Patient Education  2017 [employer]  

## 2017-07-15 NOTE — Patient Instructions (Signed)
[facility] Discharge Instructions for Patients Receiving Chemotherapy  Today you received the following chemotherapy agents Avastin,  Oxaliplatin, Leucovorin and Irinotecan.   To help prevent nausea and vomiting after your treatment, we encourage you to take your nausea medication as directed - NO ZOFRAN FOR 3 DAYS  If you develop nausea and vomiting that is not controlled by your nausea medication, call the clinic.   BELOW ARE SYMPTOMS THAT SHOULD BE REPORTED IMMEDIATELY:  *FEVER GREATER THAN 100.5 F  *CHILLS WITH OR WITHOUT FEVER  NAUSEA AND VOMITING THAT IS NOT CONTROLLED WITH YOUR NAUSEA MEDICATION  *UNUSUAL SHORTNESS OF BREATH  *UNUSUAL BRUISING OR BLEEDING  TENDERNESS IN MOUTH AND THROAT WITH OR WITHOUT PRESENCE OF ULCERS  *URINARY PROBLEMS  *BOWEL PROBLEMS  UNUSUAL RASH Items with * indicate a potential emergency and should be followed up as soon as possible.  Feel free to call the clinic you have any questions or concerns. The clinic phone number is [phone].  Please show the [facility] at check-in to the Emergency Department and triage nurse.   Bevacizumab injection What is this medicine? BEVACIZUMAB (be va SIZ yoo mab) is a monoclonal antibody. It is used to treat many types of cancer. This medicine may be used for other purposes; ask your health care provider or pharmacist if you have questions. COMMON BRAND NAME(S): Avastin What should I tell my health care provider before I take this medicine? They need to know if you have any of these conditions: -diabetes -heart disease -high blood pressure -history of coughing up blood -prior anthracycline chemotherapy (e.g., doxorubicin, daunorubicin, epirubicin) -recent or ongoing radiation therapy -recent or planning to have surgery -stroke -an unusual or allergic reaction to bevacizumab, hamster proteins, mouse proteins, other medicines, foods, dyes, or preservatives -pregnant or trying  to get pregnant -breast-feeding How should I use this medicine? This medicine is for infusion into a vein. It is given by a health care professional in a hospital or clinic setting. Talk to your pediatrician regarding the use of this medicine in children. Special care may be needed. Overdosage: If you think you have taken too much of this medicine contact a poison control center or emergency room at once. NOTE: This medicine is only for you. Do not share this medicine with others. What if I miss a dose? It is important not to miss your dose. Call your doctor or health care professional if you are unable to keep an appointment. What may interact with this medicine? Interactions are not expected. This list may not describe all possible interactions. Give your health care provider a list of all the medicines, herbs, non-prescription drugs, or dietary supplements you use. Also tell them if you smoke, drink alcohol, or use illegal drugs. Some items may interact with your medicine. What should I watch for while using this medicine? Your condition will be monitored carefully while you are receiving this medicine. You will need important blood work and urine testing done while you are taking this medicine. This medicine may increase your risk to bruise or bleed. Call your doctor or health care professional if you notice any unusual bleeding. This medicine should be started at least 28 days following major surgery and the site of the surgery should be totally healed. Check with your doctor before scheduling dental work or surgery while you are receiving this treatment. Talk to your doctor if you have recently had surgery or if you have a wound that has not healed. Do  not become pregnant while taking this medicine or for 6 months after stopping it. Women should inform their doctor if they wish to become pregnant or think they might be pregnant. There is a potential for serious side effects to an unborn child.  Talk to your health care professional or pharmacist for more information. Do not breast-feed an infant while taking this medicine and for 6 months after the last dose. This medicine has caused ovarian failure in some women. This medicine may interfere with the ability to have a child. You should talk to your doctor or health care professional if you are concerned about your fertility. What side effects may I notice from receiving this medicine? Side effects that you should report to your doctor or health care professional as soon as possible: -allergic reactions like skin rash, itching or hives, swelling of the face, lips, or tongue -chest pain or chest tightness -chills -coughing up blood -high fever -seizures -severe constipation -signs and symptoms of bleeding such as bloody or black, tarry stools; red or dark-brown urine; spitting up blood or brown material that looks like coffee grounds; red spots on the skin; unusual bruising or bleeding from the eye, gums, or nose -signs and symptoms of a blood clot such as breathing problems; chest pain; severe, sudden headache; pain, swelling, warmth in the leg -signs and symptoms of a stroke like changes in vision; confusion; trouble speaking or understanding; severe headaches; sudden numbness or weakness of the face, arm or leg; trouble walking; dizziness; loss of balance or coordination -stomach pain -sweating -swelling of legs or ankles -vomiting -weight gain Side effects that usually do not require medical attention (report to your doctor or health care professional if they continue or are bothersome): -back pain -changes in taste -decreased appetite -dry skin -nausea -tiredness This list may not describe all possible side effects. Call your doctor for medical advice about side effects. You may report side effects to FDA at 1-800-FDA-1088. Where should I keep my medicine? This drug is given in a hospital or clinic and will not be stored at  home. NOTE: This sheet is a summary. It may not cover all possible information. If you have questions about this medicine, talk to your doctor, pharmacist, or health care provider.  2018 Elsevier/Gold Standard (2016-03-08 14:33:29)

## 2017-07-17 ENCOUNTER — Inpatient Hospital Stay: Payer: BLUE CROSS/BLUE SHIELD

## 2017-07-17 VITALS — BP 114/65 | HR 71 | Temp 97.7°F | Resp 20

## 2017-07-17 DIAGNOSIS — C189 Malignant neoplasm of colon, unspecified: Secondary | ICD-10-CM

## 2017-07-17 DIAGNOSIS — Z5112 Encounter for antineoplastic immunotherapy: Secondary | ICD-10-CM | POA: Diagnosis not present

## 2017-07-17 DIAGNOSIS — C772 Secondary and unspecified malignant neoplasm of intra-abdominal lymph nodes: Principal | ICD-10-CM

## 2017-07-17 DIAGNOSIS — C187 Malignant neoplasm of sigmoid colon: Secondary | ICD-10-CM

## 2017-07-17 DIAGNOSIS — Z5111 Encounter for antineoplastic chemotherapy: Secondary | ICD-10-CM | POA: Diagnosis not present

## 2017-07-17 MED ORDER — SODIUM CHLORIDE 0.9% FLUSH
10.0000 mL | INTRAVENOUS | Status: DC | PRN
  Administered 2017-07-17: 10 mL
  Filled 2017-07-17: qty 10

## 2017-07-17 MED ORDER — HEPARIN SOD (PORK) LOCK FLUSH 100 UNIT/ML IV SOLN
500.0000 [IU] | Freq: Once | INTRAVENOUS | Status: AC | PRN
  Administered 2017-07-17: 500 [IU]
  Filled 2017-07-17: qty 5

## 2017-07-23 ENCOUNTER — Ambulatory Visit (HOSPITAL_COMMUNITY): Payer: BLUE CROSS/BLUE SHIELD

## 2017-07-24 ENCOUNTER — Ambulatory Visit (HOSPITAL_COMMUNITY)
Admission: RE | Admit: 2017-07-24 | Discharge: 2017-07-24 | Disposition: A | Discharge status: Home / Self Care | Payer: BLUE CROSS/BLUE SHIELD | Source: Ambulatory Visit | Attending: Hematology & Oncology | Admitting: Hematology & Oncology

## 2017-07-24 DIAGNOSIS — C189 Malignant neoplasm of colon, unspecified: Secondary | ICD-10-CM | POA: Insufficient documentation

## 2017-07-24 DIAGNOSIS — R9389 Abnormal findings on diagnostic imaging of other specified body structures: Secondary | ICD-10-CM | POA: Insufficient documentation

## 2017-07-24 DIAGNOSIS — K632 Fistula of intestine: Secondary | ICD-10-CM | POA: Insufficient documentation

## 2017-07-24 LAB — GLUCOSE, CAPILLARY: GLUCOSE-CAPILLARY: 96 mg/dL (ref 65–99)

## 2017-07-24 MED ORDER — FLUDEOXYGLUCOSE F - 18 (FDG) INJECTION
7.5400 | Freq: Once | INTRAVENOUS | Status: AC | PRN
  Administered 2017-07-24: 7.54 via INTRAVENOUS

## 2017-07-29 ENCOUNTER — Inpatient Hospital Stay: Payer: BLUE CROSS/BLUE SHIELD

## 2017-07-29 ENCOUNTER — Inpatient Hospital Stay: Payer: BLUE CROSS/BLUE SHIELD | Attending: Radiation Oncology | Admitting: Hematology & Oncology

## 2017-07-29 ENCOUNTER — Other Ambulatory Visit: Payer: Self-pay

## 2017-07-29 ENCOUNTER — Encounter: Payer: Self-pay | Admitting: Hematology & Oncology

## 2017-07-29 VITALS — BP 107/64 | HR 90 | Temp 97.9°F | Resp 18 | Wt 148.5 lb

## 2017-07-29 DIAGNOSIS — C189 Malignant neoplasm of colon, unspecified: Secondary | ICD-10-CM

## 2017-07-29 DIAGNOSIS — Z5112 Encounter for antineoplastic immunotherapy: Secondary | ICD-10-CM | POA: Diagnosis not present

## 2017-07-29 DIAGNOSIS — C187 Malignant neoplasm of sigmoid colon: Secondary | ICD-10-CM

## 2017-07-29 DIAGNOSIS — Z5111 Encounter for antineoplastic chemotherapy: Secondary | ICD-10-CM | POA: Insufficient documentation

## 2017-07-29 DIAGNOSIS — Z933 Colostomy status: Secondary | ICD-10-CM | POA: Diagnosis not present

## 2017-07-29 DIAGNOSIS — C772 Secondary and unspecified malignant neoplasm of intra-abdominal lymph nodes: Principal | ICD-10-CM

## 2017-07-29 DIAGNOSIS — Z452 Encounter for adjustment and management of vascular access device: Secondary | ICD-10-CM | POA: Insufficient documentation

## 2017-07-29 DIAGNOSIS — K6289 Other specified diseases of anus and rectum: Secondary | ICD-10-CM

## 2017-07-29 LAB — CMP (CANCER CENTER ONLY)
ALBUMIN: 3.1 g/dL — AB (ref 3.5–5.0)
ALT: 23 U/L (ref 10–47)
AST: 19 U/L (ref 11–38)
Alkaline Phosphatase: 143 U/L — ABNORMAL HIGH (ref 26–84)
Anion gap: 8 (ref 5–15)
BILIRUBIN TOTAL: 0.8 mg/dL (ref 0.2–1.6)
BUN: 9 mg/dL (ref 7–22)
CO2: 28 mmol/L (ref 18–33)
CREATININE: 0.6 mg/dL (ref 0.60–1.20)
Calcium: 9.2 mg/dL (ref 8.0–10.3)
Chloride: 106 mmol/L (ref 98–108)
Glucose, Bld: 112 mg/dL (ref 73–118)
Potassium: 3.8 mmol/L (ref 3.3–4.7)
Sodium: 142 mmol/L (ref 128–145)
TOTAL PROTEIN: 7.2 g/dL (ref 6.4–8.1)

## 2017-07-29 LAB — CBC WITH DIFFERENTIAL (CANCER CENTER ONLY)
BASOS ABS: 0 10*3/uL (ref 0.0–0.1)
Basophils Relative: 0 %
Eosinophils Absolute: 0.2 10*3/uL (ref 0.0–0.5)
Eosinophils Relative: 4 %
HEMATOCRIT: 31.9 % — AB (ref 34.8–46.6)
Hemoglobin: 10.6 g/dL — ABNORMAL LOW (ref 11.6–15.9)
LYMPHS PCT: 7 %
Lymphs Abs: 0.4 10*3/uL — ABNORMAL LOW (ref 0.9–3.3)
MCH: 30.4 pg (ref 26.0–34.0)
MCHC: 33.2 g/dL (ref 32.0–36.0)
MCV: 91.4 fL (ref 81.0–101.0)
MONO ABS: 0.5 10*3/uL (ref 0.1–0.9)
Monocytes Relative: 10 %
NEUTROS ABS: 4.2 10*3/uL (ref 1.5–6.5)
Neutrophils Relative %: 79 %
Platelet Count: 373 10*3/uL (ref 145–400)
RBC: 3.49 MIL/uL — AB (ref 3.70–5.32)
RDW: 14.5 % (ref 11.1–15.7)
WBC: 5.3 10*3/uL (ref 3.9–10.0)

## 2017-07-29 LAB — CEA (IN HOUSE-CHCC): CEA (CHCC-In House): 1.69 ng/mL (ref 0.00–5.00)

## 2017-07-29 LAB — LACTATE DEHYDROGENASE: LDH: 153 U/L (ref 125–245)

## 2017-07-29 MED ORDER — SODIUM CHLORIDE 0.9 % IJ SOLN
10.0000 mL | Freq: Once | INTRAMUSCULAR | Status: AC
  Administered 2017-07-29: 10 mL
  Filled 2017-07-29: qty 10

## 2017-07-29 MED ORDER — LEUCOVORIN CALCIUM INJECTION 350 MG
200.0000 mg/m2 | Freq: Once | INTRAVENOUS | Status: AC
  Administered 2017-07-29: 350 mg via INTRAVENOUS
  Filled 2017-07-29: qty 17.5

## 2017-07-29 MED ORDER — SODIUM CHLORIDE 0.9 % IV SOLN
5.0000 mg/kg | Freq: Once | INTRAVENOUS | Status: AC
  Administered 2017-07-29: 350 mg via INTRAVENOUS
  Filled 2017-07-29: qty 14

## 2017-07-29 MED ORDER — OXALIPLATIN CHEMO INJECTION 100 MG/20ML
76.5000 mg/m2 | Freq: Once | INTRAVENOUS | Status: AC
  Administered 2017-07-29: 135 mg via INTRAVENOUS
  Filled 2017-07-29: qty 7

## 2017-07-29 MED ORDER — DEXTROSE 5 % IV SOLN
Freq: Once | INTRAVENOUS | Status: AC
  Administered 2017-07-29: 11:00:00 via INTRAVENOUS

## 2017-07-29 MED ORDER — DEXAMETHASONE SODIUM PHOSPHATE 10 MG/ML IJ SOLN
10.0000 mg | Freq: Once | INTRAMUSCULAR | Status: AC
  Administered 2017-07-29: 10 mg via INTRAVENOUS

## 2017-07-29 MED ORDER — PALONOSETRON HCL INJECTION 0.25 MG/5ML
INTRAVENOUS | Status: AC
  Filled 2017-07-29: qty 5

## 2017-07-29 MED ORDER — SODIUM CHLORIDE 0.9 % IV SOLN
Freq: Once | INTRAVENOUS | Status: AC
  Administered 2017-07-29: 09:00:00 via INTRAVENOUS

## 2017-07-29 MED ORDER — DEXAMETHASONE SODIUM PHOSPHATE 10 MG/ML IJ SOLN
INTRAMUSCULAR | Status: AC
  Filled 2017-07-29: qty 1

## 2017-07-29 MED ORDER — PALONOSETRON HCL INJECTION 0.25 MG/5ML
0.2500 mg | Freq: Once | INTRAVENOUS | Status: AC
  Administered 2017-07-29: 0.25 mg via INTRAVENOUS

## 2017-07-29 MED ORDER — IRINOTECAN HCL CHEMO INJECTION 100 MG/5ML
132.0000 mg/m2 | Freq: Once | INTRAVENOUS | Status: AC
  Administered 2017-07-29: 240 mg via INTRAVENOUS
  Filled 2017-07-29: qty 10

## 2017-07-29 MED ORDER — ATROPINE SULFATE 1 MG/ML IJ SOLN: 0.5000 mg | Freq: Once | INTRAMUSCULAR | Status: DC | PRN

## 2017-07-29 MED ORDER — SODIUM CHLORIDE 0.9 % IV SOLN
2900.0000 mg/m2 | INTRAVENOUS | Status: DC
  Administered 2017-07-29: 5100 mg via INTRAVENOUS
  Filled 2017-07-29: qty 102

## 2017-07-29 NOTE — Patient Instructions (Signed)
Implanted Port Home Guide An implanted port is a type of central line that is placed under the skin. Central lines are used to provide IV access when treatment or nutrition needs to be given through a person's veins. Implanted ports are used for long-term IV access. An implanted port may be placed because:  You need IV medicine that would be irritating to the small veins in your hands or arms.  You need long-term IV medicines, such as antibiotics.  You need IV nutrition for a long period.  You need frequent blood draws for lab tests.  You need dialysis.  Implanted ports are usually placed in the chest area, but they can also be placed in the upper arm, the abdomen, or the leg. An implanted port has two main parts:  Reservoir. The reservoir is round and will appear as a small, raised area under your skin. The reservoir is the part where a needle is inserted to give medicines or draw blood.  Catheter. The catheter is a thin, flexible tube that extends from the reservoir. The catheter is placed into a large vein. Medicine that is inserted into the reservoir goes into the catheter and then into the vein.  How will I care for my incision site? Do not get the incision site wet. Bathe or shower as directed by your health care provider. How is my port accessed? Special steps must be taken to access the port:  Before the port is accessed, a numbing cream can be placed on the skin. This helps numb the skin over the port site.  Your health care provider uses a sterile technique to access the port. ? Your health care provider must put on a mask and sterile gloves. ? The skin over your port is cleaned carefully with an antiseptic and allowed to dry. ? The port is gently pinched between sterile gloves, and a needle is inserted into the port.  Only "non-coring" port needles should be used to access the port. Once the port is accessed, a blood return should be checked. This helps ensure that the port  is in the vein and is not clogged.  If your port needs to remain accessed for a constant infusion, a clear (transparent) bandage will be placed over the needle site. The bandage and needle will need to be changed every week, or as directed by your health care provider.  Keep the bandage covering the needle clean and dry. Do not get it wet. Follow your health care provider's instructions on how to take a shower or bath while the port is accessed.  If your port does not need to stay accessed, no bandage is needed over the port.  What is flushing? Flushing helps keep the port from getting clogged. Follow your health care provider's instructions on how and when to flush the port. Ports are usually flushed with saline solution or a medicine called heparin. The need for flushing will depend on how the port is used.  If the port is used for intermittent medicines or blood draws, the port will need to be flushed: ? After medicines have been given. ? After blood has been drawn. ? As part of routine maintenance.  If a constant infusion is running, the port may not need to be flushed.  How long will my port stay implanted? The port can stay in for as long as your health care provider thinks it is needed. When it is time for the port to come out, surgery will be   done to remove it. The procedure is similar to the one performed when the port was put in. When should I seek immediate medical care? When you have an implanted port, you should seek immediate medical care if:  You notice a bad smell coming from the incision site.  You have swelling, redness, or drainage at the incision site.  You have more swelling or pain at the port site or the surrounding area.  You have a fever that is not controlled with medicine.  This information is not intended to replace advice given to you by your health care provider. Make sure you discuss any questions you have with your health care provider. Document  Released: 03/11/2005 Document Revised: 08/17/2015 Document Reviewed: 11/16/2012 Elsevier Interactive Patient Education  2017 [employer]  

## 2017-07-29 NOTE — Patient Instructions (Signed)
[facility] Discharge Instructions for Patients Receiving Chemotherapy  Today you received the following chemotherapy agents Avastin/Irinotecan/Oxaliplatin/5 FU To help prevent nausea and vomiting after your treatment, we encourage you to take your nausea medication as prescribed.   If you develop nausea and vomiting that is not controlled by your nausea medication, call the clinic.   BELOW ARE SYMPTOMS THAT SHOULD BE REPORTED IMMEDIATELY:  *FEVER GREATER THAN 100.5 F  *CHILLS WITH OR WITHOUT FEVER  NAUSEA AND VOMITING THAT IS NOT CONTROLLED WITH YOUR NAUSEA MEDICATION  *UNUSUAL SHORTNESS OF BREATH  *UNUSUAL BRUISING OR BLEEDING  TENDERNESS IN MOUTH AND THROAT WITH OR WITHOUT PRESENCE OF ULCERS  *URINARY PROBLEMS  *BOWEL PROBLEMS  UNUSUAL RASH Items with * indicate a potential emergency and should be followed up as soon as possible.  Feel free to call the clinic should you have any questions or concerns. The clinic phone number is [phone].  Please show the [facility] at check-in to the Emergency Department and triage nurse.

## 2017-07-29 NOTE — Progress Notes (Signed)
Hematology and Oncology Follow Up Visit  Carla Mills [id number] [birth year] 47 y.o. 07/29/2017   Principle Diagnosis:  Recurrent colon cancer - HER2 (-)/KRAS mutant/MSI stable/MMR normal/BRAF (wt) - inoperable  Past Therapy:   FOLFOXIRI - s/p cycle #12 - completed on 11/12/2016 Status post exploratory laparotomy with HIPEC - 07/29/2016  Current Therapy: Radiation therapy with Xeloda - completed on 06/06/2017 FOLFOXIRI/Avastin - s/p cycle #1    Interim History:  Carla Mills is back for follow-up.  She is doing quite well.  Her only problem now is that she has had some discharge from the rectal area.  I am not sure as to why she has had this.  It is not bloody.  It is not painful.  She did have a PET scan done recently.  The PET scan showed that she had a very nice response to radiation with Xeloda.  There really was not much in the way of activity that was noted in the area of radiation.  I think she did have a new area of activity outside the radiation field.  She has been eating well.  She has had no nausea or vomiting.  She is had no cough.  Is been no leg swelling.  She is had no rashes.  She has had no pruritus.  She has had no fever.  Her daughter recently was diagnosed with mononucleosis.  I do not think this is going to be a problem for Carla Mills.  Overall, her performance status is ECOG 1.    Medications:  Allergies as of 07/29/2017      Reactions   Capecitabine Hives   Legs and arms, after completing therapy   Bactrim [sulfamethoxazole-trimethoprim]    Oxaprozin Hives   REACTION: Hives   Penicillins Hives   REACTION: Hives Has patient had a PCN reaction causing immediate rash, facial/tongue/throat swelling, SOB or lightheadedness with hypotension: no Has patient had a PCN reaction causing severe rash involving mucus membranes or skin necrosis: {no Has patient had a PCN reaction that required hospitalization no Has patient had a PCN reaction occurring within the  last 10 years: no If all of the above answers are "NO", then may proceed with Cephalosporin use.   Sulfonamide Derivatives Swelling   Massive extremity swelling       Medication List        Accurate as of 07/29/17  8:11 AM. Always use your most recent med list.          acetaminophen 500 MG tablet Commonly known as:  TYLENOL Take 1,000 mg by mouth every 8 (eight) hours as needed for moderate pain or headache.   capecitabine 500 MG tablet Commonly known as:  XELODA TAKE 3 TABLETS (1,500 MG TOTAL) BY MOUTH TWO TIMES DAILY AFTER A MEAL. TAKE MONDAY THROUGH SATURDAY, TAKE SUNDAYS OFF   ciprofloxacin 500 MG tablet Commonly known as:  CIPRO Take 1 tablet (500 mg total) by mouth 2 (two) times daily. For 3 days   dexamethasone 4 MG tablet Commonly known as:  DECADRON Take 2 tablets (8 mg total) by mouth daily. Start the day after chemo for 2 days.   fexofenadine 180 MG tablet Commonly known as:  ALLEGRA Take 180 mg by mouth daily as needed for allergies.   fluticasone 50 MCG/ACT nasal spray Commonly known as:  FLONASE Place 1 spray into both nostrils 2 (two) times daily.   LORazepam 1 MG tablet Commonly known as:  ATIVAN Take 1 tablet (1 mg total) by mouth every  6 (six) hours as needed (NAUSEA).   methylPREDNISolone 4 MG Tbpk tablet Commonly known as:  MEDROL DOSEPAK Take as directed on package.   morphine 15 MG tablet Commonly known as:  MSIR Take 0.5 tablets (7.5 mg total) by mouth every 4 (four) hours as needed for severe pain.   morphine 30 MG 12 hr tablet Commonly known as:  MS CONTIN Take 1 tablet (30 mg total) by mouth every 12 (twelve) hours.   moxifloxacin 400 MG tablet Commonly known as:  AVELOX Take 1 tablet (400 mg total) by mouth daily at 8 pm.   ondansetron 8 MG tablet Commonly known as:  ZOFRAN Take 1 tablet (8 mg total) by mouth 2 (two) times daily as needed for refractory nausea / vomiting. Start on day 3 after chemotherapy.   phenazopyridine 100  MG tablet Commonly known as:  PYRIDIUM Take 1 tablet (100 mg total) by mouth 3 (three) times daily as needed for pain.   prochlorperazine 10 MG tablet Commonly known as:  COMPAZINE Take 1 tablet (10 mg total) by mouth every 6 (six) hours as needed (NAUSEA).   traMADol 50 MG tablet Commonly known as:  ULTRAM Take 1 tablet (50 mg total) by mouth every 6 (six) hours as needed.       Allergies:  Allergies  Allergen Reactions  . Capecitabine Hives    Legs and arms, after completing therapy  . Bactrim [Sulfamethoxazole-Trimethoprim]   . Oxaprozin Hives    REACTION: Hives  . Penicillins Hives    REACTION: Hives Has patient had a PCN reaction causing immediate rash, facial/tongue/throat swelling, SOB or lightheadedness with hypotension: no Has patient had a PCN reaction causing severe rash involving mucus membranes or skin necrosis: {no Has patient had a PCN reaction that required hospitalization no Has patient had a PCN reaction occurring within the last 10 years: no If all of the above answers are "NO", then may proceed with Cephalosporin use.  . Sulfonamide Derivatives Swelling    Massive extremity swelling     Past Medical History, Surgical history, Social history, and Family History were reviewed and updated.  Review of Systems: Review of Systems  Constitutional: Negative.   HENT: Negative.   Eyes: Negative.   Respiratory: Negative.   Cardiovascular: Negative.   Gastrointestinal: Negative.   Genitourinary: Negative.   Musculoskeletal: Negative.   Skin: Negative.   Neurological: Negative.   Endo/Heme/Allergies: Negative.   Psychiatric/Behavioral: Negative.   All other systems reviewed and are negative.   Physical Exam:  vitals were not taken for this visit.   Wt Readings from Last 3 Encounters:  07/07/17 152 lb 3.2 oz (69 kg)  07/01/17 152 lb (68.9 kg)  06/09/17 151 lb 4 oz (68.6 kg)    Physical Exam  Constitutional: She is oriented to person, place, and  time.  HENT:  Head: Normocephalic and atraumatic.  Mouth/Throat: Oropharynx is clear and moist.  Eyes: Pupils are equal, round, and reactive to light. EOM are normal.  Neck: Normal range of motion.  Cardiovascular: Normal rate, regular rhythm and normal heart sounds.  Pulmonary/Chest: Effort normal and breath sounds normal.  Abdominal: Soft. Bowel sounds are normal.  She has a colostomy in the lower abdomen.  She has the fistula which is about 3 mm in size at the caudal end of her laparotomy scar.  The laparotomy scar is well  Musculoskeletal: Normal range of motion. She exhibits no edema, tenderness or deformity.  Lymphadenopathy:    She has no cervical adenopathy.  Neurological: She is alert and oriented to person, place, and time.  Skin: Skin is warm and dry. No rash noted. No erythema.  Psychiatric: She has a normal mood and affect. Her behavior is normal. Judgment and thought content normal.  Vitals reviewed.    Lab Results  Component Value Date   WBC 5.3 07/29/2017   HGB 10.6 (L) 07/29/2017   HCT 31.9 (L) 07/29/2017   MCV 91.4 07/29/2017   PLT 373 07/29/2017   Lab Results  Component Value Date   FERRITIN 253 07/01/2017   IRON 52 07/01/2017   TIBC 266 07/01/2017   UIBC 214 07/01/2017   IRONPCTSAT 20 (L) 07/01/2017   Lab Results  Component Value Date   RETICCTPCT 0.8 02/12/2015   RBC 3.49 (L) 07/29/2017   No results found for: KPAFRELGTCHN, LAMBDASER, KAPLAMBRATIO No results found for: IGGSERUM, IGA, IGMSERUM No results found for: Kathrynn Ducking, MSPIKE, SPEI   Chemistry      Component Value Date/Time   NA 141 07/15/2017 0820   NA 145 03/27/2017 1101   NA 141 07/30/2016 0750   K 3.9 07/15/2017 0820   K 4.5 03/27/2017 1101   K 4.2 07/30/2016 0750   CL 111 (H) 07/15/2017 0820   CL 103 03/27/2017 1101   CO2 27 07/15/2017 0820   CO2 28 03/27/2017 1101   CO2 26 07/30/2016 0750   BUN 9 07/15/2017 0820   BUN 12  03/27/2017 1101   BUN 12.1 07/30/2016 0750   CREATININE 0.80 07/15/2017 0820   CREATININE 0.71 07/01/2017 1022   CREATININE 0.9 03/27/2017 1101   CREATININE 0.7 07/30/2016 0750      Component Value Date/Time   CALCIUM 9.1 07/15/2017 0820   CALCIUM 9.6 03/27/2017 1101   CALCIUM 9.5 07/30/2016 0750   ALKPHOS 359 (H) 07/15/2017 0820   ALKPHOS 101 (H) 03/27/2017 1101   ALKPHOS 72 07/30/2016 0750   AST 25 07/15/2017 0820   AST 20 07/01/2017 1022   AST 22 07/30/2016 0750   ALT 36 07/15/2017 0820   ALT 16 07/01/2017 1022   ALT 20 03/27/2017 1101   ALT 22 07/30/2016 0750   BILITOT 0.7 07/15/2017 0820   BILITOT 0.4 07/01/2017 1022   BILITOT 0.54 07/30/2016 0750      Impression and Plan: Carla Mills is a very pleasant 47 yo caucasian female with recurrent colon cancer - HER2 (-)/KRAS (+)/MSI stable/MMR normal/BRAF -.   Everything looks quite good right now.  We will go ahead with her second cycle of treatment.  I will plan to see her back in another 2 weeks.    Volanda Napoleon, MD 5/7/20198:11 AM

## 2017-07-31 ENCOUNTER — Inpatient Hospital Stay: Payer: BLUE CROSS/BLUE SHIELD

## 2017-07-31 VITALS — BP 105/68 | HR 78 | Temp 98.0°F | Resp 17

## 2017-07-31 DIAGNOSIS — C772 Secondary and unspecified malignant neoplasm of intra-abdominal lymph nodes: Principal | ICD-10-CM

## 2017-07-31 DIAGNOSIS — Z5111 Encounter for antineoplastic chemotherapy: Secondary | ICD-10-CM | POA: Diagnosis not present

## 2017-07-31 DIAGNOSIS — C189 Malignant neoplasm of colon, unspecified: Secondary | ICD-10-CM

## 2017-07-31 DIAGNOSIS — Z5112 Encounter for antineoplastic immunotherapy: Secondary | ICD-10-CM | POA: Diagnosis not present

## 2017-07-31 DIAGNOSIS — Z452 Encounter for adjustment and management of vascular access device: Secondary | ICD-10-CM | POA: Diagnosis not present

## 2017-07-31 DIAGNOSIS — C187 Malignant neoplasm of sigmoid colon: Secondary | ICD-10-CM

## 2017-07-31 MED ORDER — SODIUM CHLORIDE 0.9% FLUSH
10.0000 mL | INTRAVENOUS | Status: DC | PRN
  Administered 2017-07-31: 10 mL
  Filled 2017-07-31: qty 10

## 2017-07-31 MED ORDER — HEPARIN SOD (PORK) LOCK FLUSH 100 UNIT/ML IV SOLN
500.0000 [IU] | Freq: Once | INTRAVENOUS | Status: AC | PRN
  Administered 2017-07-31: 500 [IU]
  Filled 2017-07-31: qty 5

## 2017-08-04 ENCOUNTER — Encounter: Payer: BLUE CROSS/BLUE SHIELD | Admitting: Genetic Counselor

## 2017-08-04 ENCOUNTER — Other Ambulatory Visit: Payer: BLUE CROSS/BLUE SHIELD

## 2017-08-05 ENCOUNTER — Other Ambulatory Visit: Payer: Self-pay | Admitting: Hematology & Oncology

## 2017-08-05 DIAGNOSIS — Z85038 Personal history of other malignant neoplasm of large intestine: Secondary | ICD-10-CM | POA: Diagnosis not present

## 2017-08-05 DIAGNOSIS — Z933 Colostomy status: Secondary | ICD-10-CM | POA: Diagnosis not present

## 2017-08-12 ENCOUNTER — Inpatient Hospital Stay: Payer: BLUE CROSS/BLUE SHIELD

## 2017-08-12 ENCOUNTER — Inpatient Hospital Stay (HOSPITAL_BASED_OUTPATIENT_CLINIC_OR_DEPARTMENT_OTHER): Payer: BLUE CROSS/BLUE SHIELD | Admitting: Family

## 2017-08-12 ENCOUNTER — Other Ambulatory Visit: Payer: Self-pay

## 2017-08-12 ENCOUNTER — Other Ambulatory Visit: Payer: Self-pay | Admitting: *Deleted

## 2017-08-12 VITALS — BP 104/65 | HR 81 | Temp 97.5°F | Resp 20 | Wt 150.8 lb

## 2017-08-12 DIAGNOSIS — Z452 Encounter for adjustment and management of vascular access device: Secondary | ICD-10-CM | POA: Diagnosis not present

## 2017-08-12 DIAGNOSIS — Z5112 Encounter for antineoplastic immunotherapy: Secondary | ICD-10-CM | POA: Diagnosis not present

## 2017-08-12 DIAGNOSIS — C189 Malignant neoplasm of colon, unspecified: Secondary | ICD-10-CM

## 2017-08-12 DIAGNOSIS — C772 Secondary and unspecified malignant neoplasm of intra-abdominal lymph nodes: Principal | ICD-10-CM

## 2017-08-12 DIAGNOSIS — C187 Malignant neoplasm of sigmoid colon: Secondary | ICD-10-CM

## 2017-08-12 DIAGNOSIS — C184 Malignant neoplasm of transverse colon: Secondary | ICD-10-CM

## 2017-08-12 DIAGNOSIS — K594 Anal spasm: Secondary | ICD-10-CM

## 2017-08-12 DIAGNOSIS — K6289 Other specified diseases of anus and rectum: Secondary | ICD-10-CM

## 2017-08-12 DIAGNOSIS — Z5111 Encounter for antineoplastic chemotherapy: Secondary | ICD-10-CM | POA: Diagnosis not present

## 2017-08-12 LAB — CBC WITH DIFFERENTIAL (CANCER CENTER ONLY)
Basophils Absolute: 0 10*3/uL (ref 0.0–0.1)
Basophils Relative: 0 %
EOS PCT: 2 %
Eosinophils Absolute: 0.1 10*3/uL (ref 0.0–0.5)
HCT: 34.4 % — ABNORMAL LOW (ref 34.8–46.6)
Hemoglobin: 11.4 g/dL — ABNORMAL LOW (ref 11.6–15.9)
LYMPHS ABS: 0.5 10*3/uL — AB (ref 0.9–3.3)
LYMPHS PCT: 9 %
MCH: 30.3 pg (ref 26.0–34.0)
MCHC: 33.1 g/dL (ref 32.0–36.0)
MCV: 91.5 fL (ref 81.0–101.0)
MONO ABS: 0.6 10*3/uL (ref 0.1–0.9)
MONOS PCT: 9 %
Neutro Abs: 4.8 10*3/uL (ref 1.5–6.5)
Neutrophils Relative %: 80 %
PLATELETS: 261 10*3/uL (ref 145–400)
RBC: 3.76 MIL/uL (ref 3.70–5.32)
RDW: 15.7 % (ref 11.1–15.7)
WBC Count: 6 10*3/uL (ref 3.9–10.0)

## 2017-08-12 LAB — CMP (CANCER CENTER ONLY)
ALT: 21 U/L (ref 10–47)
ANION GAP: 9 (ref 5–15)
AST: 21 U/L (ref 11–38)
Albumin: 3.2 g/dL — ABNORMAL LOW (ref 3.5–5.0)
Alkaline Phosphatase: 109 U/L — ABNORMAL HIGH (ref 26–84)
BUN: 18 mg/dL (ref 7–22)
CO2: 27 mmol/L (ref 18–33)
Calcium: 9.5 mg/dL (ref 8.0–10.3)
Chloride: 104 mmol/L (ref 98–108)
Creatinine: 0.9 mg/dL (ref 0.60–1.20)
Glucose, Bld: 99 mg/dL (ref 73–118)
POTASSIUM: 3.9 mmol/L (ref 3.3–4.7)
Sodium: 140 mmol/L (ref 128–145)
TOTAL PROTEIN: 7.3 g/dL (ref 6.4–8.1)
Total Bilirubin: 0.7 mg/dL (ref 0.2–1.6)

## 2017-08-12 LAB — CEA (IN HOUSE-CHCC): CEA (CHCC-IN HOUSE): 2.15 ng/mL (ref 0.00–5.00)

## 2017-08-12 MED ORDER — ATROPINE SULFATE 1 MG/ML IJ SOLN: 0.5000 mg | Freq: Once | INTRAMUSCULAR | Status: DC | PRN

## 2017-08-12 MED ORDER — IRINOTECAN HCL CHEMO INJECTION 100 MG/5ML
132.0000 mg/m2 | Freq: Once | INTRAVENOUS | Status: AC
  Administered 2017-08-12: 240 mg via INTRAVENOUS
  Filled 2017-08-12: qty 10

## 2017-08-12 MED ORDER — DEXTROSE 5 % IV SOLN
Freq: Once | INTRAVENOUS | Status: AC
  Administered 2017-08-12: 11:00:00 via INTRAVENOUS

## 2017-08-12 MED ORDER — BEVACIZUMAB CHEMO INJECTION 400 MG/16ML
5.0000 mg/kg | Freq: Once | INTRAVENOUS | Status: AC
  Administered 2017-08-12: 350 mg via INTRAVENOUS
  Filled 2017-08-12: qty 14

## 2017-08-12 MED ORDER — PALONOSETRON HCL INJECTION 0.25 MG/5ML
INTRAVENOUS | Status: AC
  Filled 2017-08-12: qty 5

## 2017-08-12 MED ORDER — DEXAMETHASONE SODIUM PHOSPHATE 10 MG/ML IJ SOLN
INTRAMUSCULAR | Status: AC
  Filled 2017-08-12: qty 1

## 2017-08-12 MED ORDER — SODIUM CHLORIDE 0.9 % IV SOLN
Freq: Once | INTRAVENOUS | Status: AC
  Administered 2017-08-12: 09:00:00 via INTRAVENOUS

## 2017-08-12 MED ORDER — PALONOSETRON HCL INJECTION 0.25 MG/5ML
0.2500 mg | Freq: Once | INTRAVENOUS | Status: AC
  Administered 2017-08-12: 0.25 mg via INTRAVENOUS

## 2017-08-12 MED ORDER — LEUCOVORIN CALCIUM INJECTION 350 MG
200.0000 mg/m2 | Freq: Once | INTRAVENOUS | Status: AC
  Administered 2017-08-12: 350 mg via INTRAVENOUS
  Filled 2017-08-12: qty 17.5

## 2017-08-12 MED ORDER — DEXAMETHASONE 4 MG PO TABS: 8.0000 mg | ORAL_TABLET | Freq: Every day | ORAL | 5 refills | Status: DC

## 2017-08-12 MED ORDER — FLUOROURACIL CHEMO INJECTION 5 GM/100ML
2900.0000 mg/m2 | INTRAVENOUS | Status: DC
  Administered 2017-08-12: 5100 mg via INTRAVENOUS
  Filled 2017-08-12: qty 102

## 2017-08-12 MED ORDER — DEXAMETHASONE SODIUM PHOSPHATE 10 MG/ML IJ SOLN
10.0000 mg | Freq: Once | INTRAMUSCULAR | Status: AC
  Administered 2017-08-12: 10 mg via INTRAVENOUS

## 2017-08-12 MED ORDER — OXALIPLATIN CHEMO INJECTION 100 MG/20ML
76.5000 mg/m2 | Freq: Once | INTRAVENOUS | Status: AC
  Administered 2017-08-12: 135 mg via INTRAVENOUS
  Filled 2017-08-12: qty 7

## 2017-08-12 MED ORDER — TRAMADOL HCL 50 MG PO TABS: 50.0000 mg | ORAL_TABLET | Freq: Four times a day (QID) | ORAL | 0 refills | Status: DC | PRN

## 2017-08-12 MED FILL — DEXAMETHASONE 4 MG TABLET: 4 | 6 days supply | Qty: 12 | Fill #0

## 2017-08-12 MED FILL — traMADol HCL 50 MG TABS: 50 | 23 days supply | Qty: 90 | Fill #0

## 2017-08-12 NOTE — Progress Notes (Addendum)
Hematology and Oncology Follow Up Visit  Carla Mills [id number] 04-07-1970 47 y.o. 08/12/2017   Principle Diagnosis:  Recurrent colon cancer - HER2 (-)/KRAS mutant/MSI stable/MMR normal/BRAF (wt) - inoperable  Past Therapy:             FOLFOXIRI - s/p cycle #12 -completed on 11/12/2016 Status post exploratory laparotomy with HIPEC - 07/29/2016 Radiation therapy with Xeloda - completed on 06/06/2017  Current Therapy:   FOLFOXIRI/Avastin - s/p cycle 2   Interim History:  Carla Mills is here today with her husband for follow-up. She states that she is having copious amounts of thick flesh/white colored discharge from her rectum. She is having contractions/spasms of the rectum and with the discharge this area is getting sore. She also states that she feels that she has a marble sitting in her rectum.  She also had a staple come out of her rectum and brought it in today. There is record of there being staples used when dividing the sigmoid colon during her surgery in December.   She has had no bleeding, no bruising or petechiae.  No fever, chills, n/v, cough, rash, dizziness, SOB, chest pain, palpitations, bloating, abdominal pain or changes in bladder habits. She states that she has intermittent "constipation" for several days and then empty a large amount into her ostomy.  No swelling, tenderness, numbness or tingling in her extremities.  She has maintained a good appetite and is staying well hydrated. Her weight is stable.   ECOG Performance Status: 1 - Symptomatic but completely ambulatory  Medications:  Allergies as of 08/12/2017      Reactions   Capecitabine Hives   Legs and arms, after completing therapy   Bactrim [sulfamethoxazole-trimethoprim]    Oxaprozin Hives   REACTION: Hives   Penicillins Hives   REACTION: Hives Has patient had a PCN reaction causing immediate rash, facial/tongue/throat swelling, SOB or lightheadedness with hypotension: no Has patient had a PCN  reaction causing severe rash involving mucus membranes or skin necrosis: {no Has patient had a PCN reaction that required hospitalization no Has patient had a PCN reaction occurring within the last 10 years: no If all of the above answers are "NO", then may proceed with Cephalosporin use.   Sulfonamide Derivatives Swelling   Massive extremity swelling       Medication List        Accurate as of 08/12/17  8:24 AM. Always use your most recent med list.          acetaminophen 500 MG tablet Commonly known as:  TYLENOL Take 1,000 mg by mouth every 8 (eight) hours as needed for moderate pain or headache.   dexamethasone 4 MG tablet Commonly known as:  DECADRON Take 2 tablets (8 mg total) by mouth daily. Start the day after chemo for 2 days.   fexofenadine 180 MG tablet Commonly known as:  ALLEGRA Take 180 mg by mouth daily as needed for allergies.   fluticasone 50 MCG/ACT nasal spray Commonly known as:  FLONASE Place 1 spray into both nostrils 2 (two) times daily.   LORazepam 1 MG tablet Commonly known as:  ATIVAN Take 1 tablet (1 mg total) by mouth every 6 (six) hours as needed (NAUSEA).   morphine 15 MG tablet Commonly known as:  MSIR Take 0.5 tablets (7.5 mg total) by mouth every 4 (four) hours as needed for severe pain.   morphine 30 MG 12 hr tablet Commonly known as:  MS CONTIN Take 1 tablet (30 mg total) by mouth  every 12 (twelve) hours.   ondansetron 8 MG tablet Commonly known as:  ZOFRAN Take 1 tablet (8 mg total) by mouth 2 (two) times daily as needed for refractory nausea / vomiting. Start on day 3 after chemotherapy.   phenazopyridine 100 MG tablet Commonly known as:  PYRIDIUM Take 1 tablet (100 mg total) by mouth 3 (three) times daily as needed for pain.   prochlorperazine 10 MG tablet Commonly known as:  COMPAZINE Take 1 tablet (10 mg total) by mouth every 6 (six) hours as needed (NAUSEA).   traMADol 50 MG tablet Commonly known as:  ULTRAM Take 1 tablet  (50 mg total) by mouth every 6 (six) hours as needed.       Allergies:  Allergies  Allergen Reactions  . Capecitabine Hives    Legs and arms, after completing therapy  . Bactrim [Sulfamethoxazole-Trimethoprim]   . Oxaprozin Hives    REACTION: Hives  . Penicillins Hives    REACTION: Hives Has patient had a PCN reaction causing immediate rash, facial/tongue/throat swelling, SOB or lightheadedness with hypotension: no Has patient had a PCN reaction causing severe rash involving mucus membranes or skin necrosis: {no Has patient had a PCN reaction that required hospitalization no Has patient had a PCN reaction occurring within the last 10 years: no If all of the above answers are "NO", then may proceed with Cephalosporin use.  . Sulfonamide Derivatives Swelling    Massive extremity swelling     Past Medical History, Surgical history, Social history, and Family History were reviewed and updated.  Review of Systems: All other 10 point review of systems is negative.   Physical Exam:  vitals were not taken for this visit.   Wt Readings from Last 3 Encounters:  07/29/17 148 lb 8 oz (67.4 kg)  07/07/17 152 lb 3.2 oz (69 kg)  07/01/17 152 lb (68.9 kg)    Ocular: Sclerae unicteric, pupils equal, round and reactive to light Ear-nose-throat: Oropharynx clear, dentition fair Lymphatic: No cervical, supraclavicular or axillary adenopathy Lungs no rales or rhonchi, good excursion bilaterally Heart regular rate and rhythm, no murmur appreciated Abd soft, nontender, positive bowel sounds, no liver or spleen tip palpated on exam, no fluid wave, stoma intact MSK no focal spinal tenderness, no joint edema Neuro: non-focal, well-oriented, appropriate affect Breasts: Deferred   Lab Results  Component Value Date   WBC 6.0 08/12/2017   HGB 11.4 (L) 08/12/2017   HCT 34.4 (L) 08/12/2017   MCV 91.5 08/12/2017   PLT 261 08/12/2017   Lab Results  Component Value Date   FERRITIN 253  07/01/2017   IRON 52 07/01/2017   TIBC 266 07/01/2017   UIBC 214 07/01/2017   IRONPCTSAT 20 (L) 07/01/2017   Lab Results  Component Value Date   RETICCTPCT 0.8 02/12/2015   RBC 3.76 08/12/2017   No results found for: KPAFRELGTCHN, LAMBDASER, KAPLAMBRATIO No results found for: IGGSERUM, IGA, IGMSERUM No results found for: Odetta Pink, SPEI   Chemistry      Component Value Date/Time   NA 142 07/29/2017 0756   NA 145 03/27/2017 1101   NA 141 07/30/2016 0750   K 3.8 07/29/2017 0756   K 4.5 03/27/2017 1101   K 4.2 07/30/2016 0750   CL 106 07/29/2017 0756   CL 103 03/27/2017 1101   CO2 28 07/29/2017 0756   CO2 28 03/27/2017 1101   CO2 26 07/30/2016 0750   BUN 9 07/29/2017 0756   BUN 12  03/27/2017 1101   BUN 12.1 07/30/2016 0750   CREATININE 0.60 07/29/2017 0756   CREATININE 0.9 03/27/2017 1101   CREATININE 0.7 07/30/2016 0750      Component Value Date/Time   CALCIUM 9.2 07/29/2017 0756   CALCIUM 9.6 03/27/2017 1101   CALCIUM 9.5 07/30/2016 0750   ALKPHOS 143 (H) 07/29/2017 0756   ALKPHOS 101 (H) 03/27/2017 1101   ALKPHOS 72 07/30/2016 0750   AST 19 07/29/2017 0756   AST 22 07/30/2016 0750   ALT 23 07/29/2017 0756   ALT 20 03/27/2017 1101   ALT 22 07/30/2016 0750   BILITOT 0.8 07/29/2017 0756   BILITOT 0.54 07/30/2016 0750      Impression and Plan: Ms. Gebert is a very pleasant 47 yo caucasian female with recurrent colon cancer, HER2 (-)/KRAS (+)/MSI stable/MMR normal/BRAF (-).  She is having large amounts of thick discharge from the rectum as well as spasms and tenderness in that area. She feels like there is a marble sitting in her rectum. I have referred her back to GI with Dr. Havery Moros who saw her during her hospital admission for surgery in December.  I'll send today's note to her surgeon, Dr. Rockne Coons, for review and she will also follow-up with their office.  We will proceed with cycle 3 today as  planned.  We will plan to see her back in 2 weeks for follow-up, lab and treatment.  She is in agreement with the plan and will contact our office with any questions or concerns. We can certainly see her sooner if need be.   Laverna Peace, NP 5/21/20198:24 AM

## 2017-08-13 ENCOUNTER — Telehealth: Payer: Self-pay | Admitting: Gastroenterology

## 2017-08-13 NOTE — Telephone Encounter (Signed)
Please see Epic referral. Dr. Havery Moros patient.   Has seen this patient in the hospital, recurrent colon cancer, having copious amounts of flesh and white colored thick discharge from rectum with spasms. She states that this area is very tender and she feels like there is a marble sitting in the rectum. Please advise on scheduling. Thanks

## 2017-08-14 ENCOUNTER — Inpatient Hospital Stay: Payer: BLUE CROSS/BLUE SHIELD

## 2017-08-14 VITALS — BP 109/68 | HR 62 | Temp 98.3°F | Resp 18

## 2017-08-14 DIAGNOSIS — C187 Malignant neoplasm of sigmoid colon: Secondary | ICD-10-CM

## 2017-08-14 DIAGNOSIS — Z5111 Encounter for antineoplastic chemotherapy: Secondary | ICD-10-CM | POA: Diagnosis not present

## 2017-08-14 DIAGNOSIS — C189 Malignant neoplasm of colon, unspecified: Secondary | ICD-10-CM

## 2017-08-14 DIAGNOSIS — C772 Secondary and unspecified malignant neoplasm of intra-abdominal lymph nodes: Principal | ICD-10-CM

## 2017-08-14 DIAGNOSIS — Z5112 Encounter for antineoplastic immunotherapy: Secondary | ICD-10-CM | POA: Diagnosis not present

## 2017-08-14 DIAGNOSIS — Z452 Encounter for adjustment and management of vascular access device: Secondary | ICD-10-CM | POA: Diagnosis not present

## 2017-08-14 MED ORDER — HEPARIN SOD (PORK) LOCK FLUSH 100 UNIT/ML IV SOLN
500.0000 [IU] | Freq: Once | INTRAVENOUS | Status: AC | PRN
  Administered 2017-08-14: 500 [IU]
  Filled 2017-08-14: qty 5

## 2017-08-14 MED ORDER — SODIUM CHLORIDE 0.9% FLUSH
10.0000 mL | INTRAVENOUS | Status: DC | PRN
  Administered 2017-08-14: 10 mL
  Filled 2017-08-14: qty 10

## 2017-08-20 ENCOUNTER — Telehealth: Payer: Self-pay | Admitting: *Deleted

## 2017-08-20 ENCOUNTER — Encounter (HOSPITAL_COMMUNITY): Payer: Self-pay

## 2017-08-20 DIAGNOSIS — Z933 Colostomy status: Secondary | ICD-10-CM | POA: Diagnosis not present

## 2017-08-20 NOTE — Telephone Encounter (Signed)
Patient's surgeon would like patient to have a sigmoidoscopy this Friday for additional follow up. Patient would like to make sure this is okay.  Reviewed with Dr Marin Olp. He is okay with patient having procedure. Patient aware.

## 2017-08-21 ENCOUNTER — Other Ambulatory Visit: Payer: Self-pay

## 2017-08-21 ENCOUNTER — Encounter (HOSPITAL_COMMUNITY): Payer: Self-pay | Admitting: Emergency Medicine

## 2017-08-22 ENCOUNTER — Encounter (HOSPITAL_COMMUNITY): Payer: Self-pay | Admitting: *Deleted

## 2017-08-22 ENCOUNTER — Ambulatory Visit (HOSPITAL_COMMUNITY)
Admission: RE | Admit: 2017-08-22 | Discharge: 2017-08-22 | Disposition: A | Discharge status: Home / Self Care | Payer: BLUE CROSS/BLUE SHIELD | Source: Ambulatory Visit | Attending: Surgery | Admitting: Surgery

## 2017-08-22 ENCOUNTER — Ambulatory Visit (HOSPITAL_COMMUNITY): Payer: BLUE CROSS/BLUE SHIELD | Admitting: Anesthesiology

## 2017-08-22 ENCOUNTER — Encounter (HOSPITAL_COMMUNITY)
Admission: RE | Disposition: A | Discharge status: Home / Self Care | Payer: Self-pay | Source: Ambulatory Visit | Attending: Surgery

## 2017-08-22 ENCOUNTER — Other Ambulatory Visit: Payer: Self-pay

## 2017-08-22 DIAGNOSIS — C20 Malignant neoplasm of rectum: Secondary | ICD-10-CM | POA: Insufficient documentation

## 2017-08-22 DIAGNOSIS — K5900 Constipation, unspecified: Secondary | ICD-10-CM | POA: Diagnosis not present

## 2017-08-22 DIAGNOSIS — D62 Acute posthemorrhagic anemia: Secondary | ICD-10-CM | POA: Diagnosis not present

## 2017-08-22 HISTORY — PX: FLEXIBLE SIGMOIDOSCOPY: SHX5431

## 2017-08-22 SURGERY — SIGMOIDOSCOPY, FLEXIBLE
Anesthesia: General

## 2017-08-22 MED ORDER — LACTATED RINGERS IV SOLN
INTRAVENOUS | Status: DC
  Administered 2017-08-22: 1000 mL via INTRAVENOUS

## 2017-08-22 MED ORDER — CHLORHEXIDINE GLUCONATE CLOTH 2 % EX PADS: 6.0000 | MEDICATED_PAD | Freq: Once | CUTANEOUS | Status: DC

## 2017-08-22 MED ORDER — HEPARIN SOD (PORK) LOCK FLUSH 100 UNIT/ML IV SOLN
500.0000 [IU] | Freq: Once | INTRAVENOUS | Status: AC
  Administered 2017-08-22: 500 [IU] via INTRAVENOUS

## 2017-08-22 MED ORDER — PROPOFOL 10 MG/ML IV BOLUS
INTRAVENOUS | Status: AC
  Filled 2017-08-22: qty 40

## 2017-08-22 MED ORDER — HEPARIN SOD (PORK) LOCK FLUSH 100 UNIT/ML IV SOLN
INTRAVENOUS | Status: AC
  Filled 2017-08-22: qty 5

## 2017-08-22 MED ORDER — PROPOFOL 500 MG/50ML IV EMUL
INTRAVENOUS | Status: DC | PRN
  Administered 2017-08-22: 150 ug/kg/min via INTRAVENOUS

## 2017-08-22 MED ORDER — MORPHINE SULFATE ER 30 MG PO TBCR: 30.0000 mg | EXTENDED_RELEASE_TABLET | Freq: Two times a day (BID) | ORAL | 0 refills | Status: DC

## 2017-08-22 MED ORDER — ONDANSETRON HCL 4 MG/2ML IJ SOLN
INTRAMUSCULAR | Status: DC | PRN
  Administered 2017-08-22: 4 mg via INTRAVENOUS

## 2017-08-22 MED FILL — MORPHINE SULF ER 30 MG TAB: 30 | 15 days supply | Qty: 30 | Fill #0

## 2017-08-22 NOTE — Anesthesia Preprocedure Evaluation (Signed)
Anesthesia Evaluation  Patient identified by MRN, date of birth, ID band Patient awake    Reviewed: Allergy & Precautions, NPO status , Patient's Chart, lab work & pertinent test results  History of Anesthesia Complications (+) PONV and history of anesthetic complications  Airway Mallampati: II  TM Distance: >3 FB Neck ROM: Full    Dental no notable dental hx. (+) Dental Advisory Given   Pulmonary neg pulmonary ROS,    Pulmonary exam normal breath sounds clear to auscultation       Cardiovascular negative cardio ROS Normal cardiovascular exam Rhythm:Regular Rate:Normal     Neuro/Psych negative neurological ROS  negative psych ROS   GI/Hepatic Neg liver ROS, Colon ca   Endo/Other  negative endocrine ROS  Renal/GU negative Renal ROS  negative genitourinary   Musculoskeletal negative musculoskeletal ROS (+)   Abdominal   Peds negative pediatric ROS (+)  Hematology negative hematology ROS (+)   Anesthesia Other Findings   Reproductive/Obstetrics negative OB ROS                             Anesthesia Physical  Anesthesia Plan  ASA: II  Anesthesia Plan: General   Post-op Pain Management:    Induction: Intravenous  PONV Risk Score and Plan: 3 and Ondansetron and Treatment may vary due to age or medical condition  Airway Management Planned: Nasal Cannula, Natural Airway and Mask  Additional Equipment:   Intra-op Plan:   Post-operative Plan: Extubation in OR  Informed Consent: I have reviewed the patients History and Physical, chart, labs and discussed the procedure including the risks, benefits and alternatives for the proposed anesthesia with the patient or authorized representative who has indicated his/her understanding and acceptance.   Dental advisory given  Plan Discussed with: CRNA, Anesthesiologist and Surgeon  Anesthesia Plan Comments:         Anesthesia  Quick Evaluation

## 2017-08-22 NOTE — Anesthesia Postprocedure Evaluation (Signed)
Anesthesia Post Note  Patient: Carla Mills  Procedure(s) Performed: FLEXIBLE SIGMOIDOSCOPY (N/A )     Patient location during evaluation: PACU Anesthesia Type: General Level of consciousness: awake and alert Pain management: pain level controlled Vital Signs Assessment: post-procedure vital signs reviewed and stable Respiratory status: spontaneous breathing, nonlabored ventilation, respiratory function stable and patient connected to nasal cannula oxygen Cardiovascular status: blood pressure returned to baseline and stable Postop Assessment: no apparent nausea or vomiting Anesthetic complications: no    Last Vitals:  Vitals:   08/22/17 1230 08/22/17 1240  BP: 131/84 115/83  Pulse: 66 (!) 59  Resp: 10 (!) 22  Temp:    SpO2: 99% 100%    Last Pain:  Vitals:   08/22/17 1240  TempSrc:   PainSc: 8                  Enza Shone

## 2017-08-22 NOTE — H&P (Signed)
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 female with a diverting colostomy and mucous fistula with recurrent cancer at the peritoneal reflection.  Recent urgency and mucous production per rectum.  For flex sigmoidoscopy.  Past Medical History:  Diagnosis Date  . 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 Colonic cancer ([place]) 03/09/2015  . History of chemotherapy    completed on 09/12/2015   . PONV (postoperative nausea and vomiting)   . Rectal mass 02/20/15   Rectal/sigmoid mass    Past Surgical History:  Procedure Laterality Date  . APPENDECTOMY  02/20/15   Abnormal appearing  . CESAREAN SECTION     x 2  . COLON RESECTION N/A 03/07/2017   Procedure: EXPLORATORY LAPAROTOMY AND CREATION OF A COLOSTOMY AND MUCOUS FISTULA;  Surgeon: Johnathan Hausen, MD;  Location: [facility];  Service: General;  Laterality: N/A;  . COLONOSCOPY N/A 10/09/2015   Procedure: COLONOSCOPY;  Surgeon: Leighton Ruff, MD;  Location: [facility];  Service: Endoscopy;  Laterality: N/A;  . COLOSTOMY  02/20/15   Sigmoid/rectal mass  . COLOSTOMY N/A 02/20/2015   Procedure: COLOSTOMY;  Surgeon: [name], MD;  Location: [place];  Service: General;  Laterality: N/A;  . COLOSTOMY REVERSAL N/A 11/15/2015   Procedure: COLOSTOMY REVERSAL;  Surgeon: [name], MD;  Location: [facility];  Service: General;  Laterality: N/A;  . EXPLORATORY LAPAROTOMY  02/20/15   Sigmoid/rectal mass  . FLEXIBLE SIGMOIDOSCOPY N/A 02/15/2015   Procedure: FLEXIBLE SIGMOIDOSCOPY;  Surgeon: [name], MD;  Location: [facility]COPY;  Service: Endoscopy;  Laterality: N/A;  . FLEXIBLE SIGMOIDOSCOPY N/A 03/06/2017   Procedure: FLEXIBLE SIGMOIDOSCOPY;  Surgeon: Yetta Flock, MD;  Location: [facility];  Service: Gastroenterology;  Laterality: N/A;  . HIPEC procedure  07/29/2016   done at  [place]  04/08/2016   IR FLUORO GUIDE PORT INSERTION RIGHT 04/08/2016 [name], MD [facility]  . IR GENERIC HISTORICAL  04/08/2016   IR US GUIDE VASC ACCESS RIGHT 04/08/2016 [name], MD [facility]  . LAPAROSCOPIC LYSIS OF ADHESIONS N/A 11/15/2015   Procedure: LAPAROSCOPIC LYSIS OF ADHESIONS;  Surgeon: [name], MD;  Location: [facility];  Service: General;  Laterality: N/A;  . LAPAROSCOPY N/A 02/20/2015   Procedure: LAPAROSCOPY DIAGNOSTIC;  Surgeon: [name], MD;  Location: [place];  Service: General;  Laterality: N/A;  . LAPAROSCOPY ABDOMEN DIAGNOSTIC  02/20/15   Converted to open - Sigmoid/rectal mass  . LAPAROTOMY N/A 02/20/2015   Procedure: EXPLORATORY LAPAROTOMY AND PARTIAL COLECTOMY;  Surgeon: [name], MD;  Location: [place];  Service: General;  Laterality: N/A;  . LOW ANTERIOR BOWEL RESECTION  02/20/15   Sigmoid/rectal mass  . OSTEOTOMY     reconstructed coccyx   . reconstructed nerve in ankle     . TONSILLECTOMY      Current Facility-Administered Medications  Medication Dose Route Frequency Provider Last Rate Last Dose  . Chlorhexidine Gluconate Cloth 2 % PADS 6 each  6 each Topical Once Johnathan Hausen, MD       And  . Chlorhexidine Gluconate Cloth 2 % PADS 6 each  6 each Topical Once Johnathan Hausen, MD      . lactated ringers infusion   Intravenous Continuous Johnathan Hausen, MD 10 mL/hr at 08/22/17 1018 1,000 mL at 08/22/17 1018   Facility-Administered Medications Ordered in Other Encounters  Medication Dose Route Frequency Provider Last Rate Last Dose  . dextrose 5 % solution   Intravenous Continuous Volanda Napoleon, MD   Stopped at 04/09/16 1418   Capecitabine; Bactrim [sulfamethoxazole-trimethoprim]; Oxaprozin; Penicillins; and Sulfonamide derivatives History reviewed. No pertinent family history. Social History:   reports that she has never smoked. She has never used smokeless tobacco. She reports that  she drinks alcohol. She reports that she does not use drugs.   REVIEW OF SYSTEMS : Negative except for see problem list  Physical Exam:   Blood pressure 128/82, pulse 90, temperature 98 F (36.7 C), temperature source Oral, resp. rate 11, height 5\' 5"  (1.651 m), SpO2 96 %. Body mass index is 25.09 kg/m.  Gen:  WDWN WF NAD  Neurological: Alert and oriented to person, place, and time. Motor and sensory function is grossly intact  Head: Normocephalic and atraumatic.  Eyes: Conjunctivae are normal. Pupils are equal, round, and reactive to light. No scleral icterus.  Neck: Normal range of motion. Neck supple. No tracheal deviation or thyromegaly present.  Cardiovascular:  SR without murmurs or gallops.  No carotid bruits Breast:  Not examined Respiratory: Effort normal.  No respiratory distress. No chest wall tenderness. Breath sounds normal.  No wheezes, rales or rhonchi.  Abdomen:  Colostomy in the left lower quadrant with mucous fistula in the lower portion of her midline incision GU:  Rectal reveals palpable mass in the mid rectum Musculoskeletal: Normal range of motion. Extremities are nontender. No cyanosis, edema or clubbing noted Lymphadenopathy: No cervical, preauricular, postauricular or axillary adenopathy is present Skin: Skin is warm and dry. No rash noted. No diaphoresis. No erythema. No pallor. Pscyh: Normal mood and affect. Behavior is normal. Judgment and thought content normal.   LABORATORY RESULTS: No results found for this or any previous visit (from the past 48 hour(s)).   RADIOLOGY RESULTS: No results found.  Problem List: Patient Active Problem List   Diagnosis Date Noted  . Palliative care by specialist   . Cancer associated pain   . DNR (do not resuscitate) discussion   . Acute blood loss anemia   . 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 History of colon cancer 03/03/2017  . Abdominal pain 03/03/2017  . Intractable nausea and vomiting 05/25/2016  . Anxiety state  05/25/2016  . Acute colitis 05/24/2016  . 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 Metastatic colon cancer in female [facility] [place]) 03/09/2015  . Colonic diverticular abscess 02/10/2015  . Constipation 02/10/2015  . Anemia, iron deficiency 02/10/2015  . Liver lesion 02/10/2015  . ACUTE PHARYNGITIS 12/23/2009  . 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      Matt B. Hassell Done, MD, [facility] [phone] beeper [phone]  08/22/2017 11:26 AM

## 2017-08-22 NOTE — Op Note (Signed)
Chauntay Paszkiewicz  [birth year] Aug 22, 2017   PCP:  Volanda Napoleon, MD   Surgeon: Kaylyn Lim, MD, FACS  Asst:  Alphonsa Overall, MD, FACS  Anes:  proprofol  Preop Dx: Rectal cancer  Postop Dx: Rectal cancer  Procedure: Flexible endoscopy Location Surgery: [facility] Complications: none  EBL:   minimal cc  Drains: none  Description of Procedure:  The patient was taken to OR 4 at [facility] .  After anesthesia was administered  a timeout was performed.  Rectal exam was performed by both Dr. Lucia Gaskins and myself and we agreed that there was a firm mass that was fixed more anteriorly and was circumferential and there appeared to be a palpable opening in this.  This was easily palpable at the tip of the index finger.  The colonoscope was inserted and gentle inflation used.  We reached an area where there were visible staples and a Prolene suture was noted.  A small opening could be noted above that which we did not attempt to traverse but likely went up to the mucous fistula at the skin.  Photos were taken and are included here and this will be submitted and hopefully better scanned into Epic.     The patient tolerated the procedure well   Impression:  Rectal cancer that has shrunk with mucous production distally and tenesmus.  Patient experiencing rectal pain and will be given Percocet for pain.     Matt B. Hassell Done, [place], [facility], [place]

## 2017-08-22 NOTE — Transfer of Care (Signed)
Immediate Anesthesia Transfer of Care Note  Patient: Carla Mills  Procedure(s) Performed: FLEXIBLE SIGMOIDOSCOPY (N/A )  Patient Location: PACU  Anesthesia Type:MAC  Level of Consciousness: awake, alert  and patient cooperative  Airway & Oxygen Therapy: Patient Spontanous Breathing and Patient connected to nasal cannula oxygen  Post-op Assessment: Report given to RN and Post -op Vital signs reviewed and stable  Post vital signs: Reviewed and stable  Last Vitals:  Vitals Value Taken Time  BP    Temp    Pulse 75 08/22/2017 12:08 PM  Resp 25 08/22/2017 12:08 PM  SpO2 99 % 08/22/2017 12:08 PM  Vitals shown include unvalidated device data.  Last Pain:  Vitals:   08/22/17 1006  TempSrc: Oral  PainSc: 8       Patients Stated Pain Goal: 0 ([id number])  Complications: No apparent anesthesia complications

## 2017-08-22 NOTE — Discharge Instructions (Signed)
Flexible Sigmoidoscopy, Care After  This sheet gives you information about how to care for yourself after your procedure. Your health care provider may also give you more specific instructions. If you have problems or questions, contact your health care provider.  What can I expect after the procedure?  After the procedure, it is common to have:  · Abdominal cramping or pain.  · Bloating.  · A small amount of rectal bleeding if you had a biopsy.    Follow these instructions at home:  · Take over-the-counter and prescription medicines only as told by your health care provider.  · Do not drive for 24 hours if you received a medicine to help you relax (sedative).  · Keep all follow-up visits as told by your health care provider. This is important.  Contact a health care provider if:  · You have abdominal pain or cramping that gets worse or is not helped with medicine.  · You continue to have small amounts of rectal bleeding after 24 hours.  · You have nausea or vomiting.  · You feel weak or dizzy.  · You have a fever.  Get help right away if:  · You pass large blood clots or see a large amount of blood in the toilet after having a bowel movement.  · You have nausea or vomiting for more than 24 hours after the procedure.  This information is not intended to replace advice given to you by your health care provider. Make sure you discuss any questions you have with your health care provider.  Document Released: 03/16/2013 Document Revised: 09/29/2015 Document Reviewed: 06/10/2015  Elsevier Interactive Patient Education © 2018 [employer]

## 2017-08-25 ENCOUNTER — Encounter (HOSPITAL_COMMUNITY): Payer: Self-pay | Admitting: Surgery

## 2017-08-26 ENCOUNTER — Inpatient Hospital Stay: Payer: BLUE CROSS/BLUE SHIELD | Attending: Radiation Oncology | Admitting: Hematology & Oncology

## 2017-08-26 ENCOUNTER — Inpatient Hospital Stay: Payer: BLUE CROSS/BLUE SHIELD

## 2017-08-26 ENCOUNTER — Other Ambulatory Visit: Payer: Self-pay

## 2017-08-26 VITALS — BP 108/72 | HR 87 | Temp 97.5°F | Resp 17 | Wt 150.0 lb

## 2017-08-26 DIAGNOSIS — C189 Malignant neoplasm of colon, unspecified: Secondary | ICD-10-CM | POA: Insufficient documentation

## 2017-08-26 DIAGNOSIS — R52 Pain, unspecified: Secondary | ICD-10-CM

## 2017-08-26 DIAGNOSIS — C187 Malignant neoplasm of sigmoid colon: Secondary | ICD-10-CM

## 2017-08-26 DIAGNOSIS — Z5111 Encounter for antineoplastic chemotherapy: Secondary | ICD-10-CM | POA: Insufficient documentation

## 2017-08-26 DIAGNOSIS — C772 Secondary and unspecified malignant neoplasm of intra-abdominal lymph nodes: Principal | ICD-10-CM

## 2017-08-26 DIAGNOSIS — Z933 Colostomy status: Secondary | ICD-10-CM

## 2017-08-26 LAB — CBC WITH DIFFERENTIAL (CANCER CENTER ONLY)
BASOS ABS: 0 10*3/uL (ref 0.0–0.1)
Basophils Relative: 0 %
EOS ABS: 0.1 10*3/uL (ref 0.0–0.5)
EOS PCT: 2 %
HCT: 35.1 % (ref 34.8–46.6)
Hemoglobin: 11.7 g/dL (ref 11.6–15.9)
LYMPHS PCT: 10 %
Lymphs Abs: 0.5 10*3/uL — ABNORMAL LOW (ref 0.9–3.3)
MCH: 29.9 pg (ref 26.0–34.0)
MCHC: 33.3 g/dL (ref 32.0–36.0)
MCV: 89.8 fL (ref 81.0–101.0)
MONO ABS: 0.6 10*3/uL (ref 0.1–0.9)
Monocytes Relative: 12 %
Neutro Abs: 3.9 10*3/uL (ref 1.5–6.5)
Neutrophils Relative %: 76 %
PLATELETS: 248 10*3/uL (ref 145–400)
RBC: 3.91 MIL/uL (ref 3.70–5.32)
RDW: 15.5 % (ref 11.1–15.7)
WBC: 5.1 10*3/uL (ref 3.9–10.0)

## 2017-08-26 LAB — CMP (CANCER CENTER ONLY)
ALT: 19 U/L (ref 10–47)
ANION GAP: 9 (ref 5–15)
AST: 22 U/L (ref 11–38)
Albumin: 3.3 g/dL — ABNORMAL LOW (ref 3.5–5.0)
Alkaline Phosphatase: 87 U/L — ABNORMAL HIGH (ref 26–84)
BUN: 15 mg/dL (ref 7–22)
CO2: 29 mmol/L (ref 18–33)
Calcium: 9.6 mg/dL (ref 8.0–10.3)
Chloride: 104 mmol/L (ref 98–108)
Creatinine: 1.1 mg/dL (ref 0.60–1.20)
GLUCOSE: 91 mg/dL (ref 73–118)
POTASSIUM: 4.2 mmol/L (ref 3.3–4.7)
SODIUM: 142 mmol/L (ref 128–145)
Total Bilirubin: 0.7 mg/dL (ref 0.2–1.6)
Total Protein: 7.2 g/dL (ref 6.4–8.1)

## 2017-08-26 MED ORDER — DEXAMETHASONE SODIUM PHOSPHATE 10 MG/ML IJ SOLN
10.0000 mg | Freq: Once | INTRAMUSCULAR | Status: AC
  Administered 2017-08-26: 10 mg via INTRAVENOUS

## 2017-08-26 MED ORDER — LEUCOVORIN CALCIUM INJECTION 350 MG
200.0000 mg/m2 | Freq: Once | INTRAVENOUS | Status: AC
  Administered 2017-08-26: 350 mg via INTRAVENOUS
  Filled 2017-08-26: qty 17.5

## 2017-08-26 MED ORDER — PALONOSETRON HCL INJECTION 0.25 MG/5ML
INTRAVENOUS | Status: AC
  Filled 2017-08-26: qty 5

## 2017-08-26 MED ORDER — OXALIPLATIN CHEMO INJECTION 100 MG/20ML
120.0000 mg | Freq: Once | INTRAVENOUS | Status: AC
  Administered 2017-08-26: 120 mg via INTRAVENOUS
  Filled 2017-08-26: qty 24

## 2017-08-26 MED ORDER — DEXAMETHASONE SODIUM PHOSPHATE 10 MG/ML IJ SOLN
INTRAMUSCULAR | Status: AC
  Filled 2017-08-26: qty 1

## 2017-08-26 MED ORDER — SODIUM CHLORIDE 0.9 % IV SOLN
INTRAVENOUS | Status: DC
  Administered 2017-08-26: 10:00:00 via INTRAVENOUS

## 2017-08-26 MED ORDER — PREGABALIN 75 MG PO CAPS: 75.0000 mg | ORAL_CAPSULE | Freq: Two times a day (BID) | ORAL | 3 refills | Status: DC

## 2017-08-26 MED ORDER — SODIUM CHLORIDE 0.9 % IV SOLN
2900.0000 mg/m2 | INTRAVENOUS | Status: DC
  Administered 2017-08-26: 5100 mg via INTRAVENOUS
  Filled 2017-08-26: qty 102

## 2017-08-26 MED ORDER — ATROPINE SULFATE 1 MG/ML IJ SOLN: 0.5000 mg | Freq: Once | INTRAMUSCULAR | Status: DC | PRN

## 2017-08-26 MED ORDER — PALONOSETRON HCL INJECTION 0.25 MG/5ML
0.2500 mg | Freq: Once | INTRAVENOUS | Status: AC
  Administered 2017-08-26: 0.25 mg via INTRAVENOUS

## 2017-08-26 MED ORDER — DEXTROSE 5 % IV SOLN
Freq: Once | INTRAVENOUS | Status: AC
  Administered 2017-08-26: 12:00:00 via INTRAVENOUS

## 2017-08-26 MED ORDER — IRINOTECAN HCL CHEMO INJECTION 100 MG/5ML
210.0000 mg | Freq: Once | INTRAVENOUS | Status: AC
  Administered 2017-08-26: 220 mg via INTRAVENOUS
  Filled 2017-08-26: qty 10

## 2017-08-26 MED FILL — LYRICA 75 MG CAPSULE: 75 | 30 days supply | Qty: 60 | Fill #0

## 2017-08-26 NOTE — Patient Instructions (Signed)
Carla Mills Discharge Instructions for Patients Receiving Chemotherapy  Today you received the following chemotherapy agents:  Irinotecan, Oxaliplatin, Leucovorin and 5FU  To help prevent nausea and vomiting after your treatment, we encourage you to take your nausea medication as ordered per MD.    If you develop nausea and vomiting that is not controlled by your nausea medication, call the clinic.   BELOW ARE SYMPTOMS THAT SHOULD BE REPORTED IMMEDIATELY:  *FEVER GREATER THAN 100.5 F  *CHILLS WITH OR WITHOUT FEVER  NAUSEA AND VOMITING THAT IS NOT CONTROLLED WITH YOUR NAUSEA MEDICATION  *UNUSUAL SHORTNESS OF BREATH  *UNUSUAL BRUISING OR BLEEDING  TENDERNESS IN MOUTH AND THROAT WITH OR WITHOUT PRESENCE OF ULCERS  *URINARY PROBLEMS  *BOWEL PROBLEMS  UNUSUAL RASH Items with * indicate a potential emergency and should be followed up as soon as possible.  Feel free to call the clinic should you have any questions or concerns. The clinic phone number is [phone].  Please show the [place] at check-in to the Emergency Department and triage nurse.

## 2017-08-26 NOTE — Progress Notes (Signed)
Hematology and Oncology Follow Up Visit  Carla Mills [id number] [birth year] 47 y.o. 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  Unfortunately, she is having more problems with pain.  This seems to happen when she sits or stands.  She feels as if a tumor is dropping into her pelvis.  She was seen by Dr. Hassell Done of surgery.  He went ahead and did a flexible sigmoidoscopy on her.  This was last week.  He shows some very impressive pictures in the computer.  He felt that there was a nice response.  He could feel the tumor.  The tumor probably is small and is mobile and is dropping when she stands or sits and is probably pressing on the sacral plexus.  This is a tough problem.  I do not know if radiation could be done.  She is already had quite a bit of radiation to the pelvic area.  I thought may be she might consider surgery.  She has heard of Dr. Morton Stall at [facility].  He is a rectal specialist.  We will see about making a referral to him.  I will also have to call Dr. Sondra Come of radiation oncology.  She had more problems with the last cycle of chemotherapy.  Pain clearly is her biggest issue.  She is on MS Contin 30 mg every 12 hours.  She gets some relief.  I will see about changing the dose to every 8 hours.    I will also try her on some Lyrica.  She may have a neuropathic type component of the discomfort.  We are clearly dealing with her quality of life issues.  I want her to be active as she has 3 daughters and she wants to be active in their life.  She read me some incredible homes that her eighth grade daughter  wrote.  It was totally impressive how profound and how personal these problems were.  They clearly show how her eighth grade daughter has been affected by her mom's cancer.  Her appetite seems to be doing pretty well.  She is had very little if any nausea or vomiting.  Her colostomy bag is working well.  I am going to go ahead and do another PET scan on her after this cycle of treatment.  I think this would be reasonable.  We can see how things are overall, her performance status is ECOG 1.    Medications:  Allergies as of 08/26/2017      Reactions   Capecitabine Hives, Other (See Comments)   Legs and arms, after completing therapy   Bactrim [sulfamethoxazole-trimethoprim] Swelling, Other (See Comments)   Severe swelling   Oxaprozin Hives   Penicillins Hives, Other (See Comments)   Has patient had a PCN reaction causing immediate rash, facial/tongue/throat swelling, SOB or lightheadedness with hypotension: no Has patient had a PCN reaction causing severe rash involving mucus membranes or skin necrosis: {no Has patient had a PCN reaction that required hospitalization no Has patient had a PCN reaction occurring within the last 10 years: no If all of the above answers are "NO", then may proceed with Cephalosporin use.   Sulfonamide  Derivatives Swelling, Other (See Comments)   Massive extremity swelling       Medication List        Accurate as of 08/26/17  9:00 AM. Always use your most recent med list.          dexamethasone 4 MG tablet Commonly known as:  DECADRON Take 2 tablets (8 mg total) by mouth daily. Start the day after chemo for 2 days.   fluticasone 50 MCG/ACT nasal spray Commonly known as:  FLONASE Place 1 spray into both nostrils 2 (two) times daily.   LORazepam 1 MG tablet Commonly known as:  ATIVAN Take 1 tablet (1 mg total) by mouth every 6 (six) hours as needed (NAUSEA).   morphine 15 MG tablet Commonly known as:  MSIR Take 0.5 tablets (7.5 mg total) by  mouth every 4 (four) hours as needed for severe pain.   morphine 30 MG 12 hr tablet Commonly known as:  MS CONTIN Take 1 tablet (30 mg total) by mouth every 12 (twelve) hours.   ondansetron 8 MG tablet Commonly known as:  ZOFRAN Take 1 tablet (8 mg total) by mouth 2 (two) times daily as needed for refractory nausea / vomiting. Start on day 3 after chemotherapy.   prochlorperazine 10 MG tablet Commonly known as:  COMPAZINE Take 1 tablet (10 mg total) by mouth every 6 (six) hours as needed (NAUSEA).   traMADol 50 MG tablet Commonly known as:  ULTRAM Take 1 tablet (50 mg total) by mouth every 6 (six) hours as needed.       Allergies:  Allergies  Allergen Reactions  . Capecitabine Hives and Other (See Comments)    Legs and arms, after completing therapy  . Bactrim [Sulfamethoxazole-Trimethoprim] Swelling and Other (See Comments)    Severe swelling  . Oxaprozin Hives  . Penicillins Hives and Other (See Comments)    Has patient had a PCN reaction causing immediate rash, facial/tongue/throat swelling, SOB or lightheadedness with hypotension: no Has patient had a PCN reaction causing severe rash involving mucus membranes or skin necrosis: {no Has patient had a PCN reaction that required hospitalization no Has patient had a PCN reaction occurring within the last 10 years: no If all of the above answers are "NO", then may proceed with Cephalosporin use.  . Sulfonamide Derivatives Swelling and Other (See Comments)    Massive extremity swelling     Past Medical History, Surgical history, Social history, and Family History were reviewed and updated.  Review of Systems: Review of Systems  Constitutional: Negative.   HENT: Negative.   Eyes: Negative.   Respiratory: Negative.   Cardiovascular: Negative.   Gastrointestinal: Negative.   Genitourinary: Negative.   Musculoskeletal: Negative.   Skin: Negative.   Neurological: Negative.   Endo/Heme/Allergies: Negative.     Psychiatric/Behavioral: Negative.   All other systems reviewed and are negative.   Physical Exam:  weight is 150 lb (68 kg). Her oral temperature is 97.5 F (36.4 C) (abnormal). Her blood pressure is 108/72 and her pulse is 87. Her respiration is 17 and oxygen saturation is 100%.   Wt Readings from Last 3 Encounters:  08/26/17 150 lb (68 kg)  08/12/17 150 lb 12 oz (68.4 kg)  07/29/17 148 lb 8 oz (67.4 kg)    Physical Exam  Constitutional: She is oriented to person, place, and time.  HENT:  Head: Normocephalic and atraumatic.  Mouth/Throat: Oropharynx is clear and moist.  Eyes: Pupils are equal, round, and reactive to light. EOM are  normal.  Neck: Normal range of motion.  Cardiovascular: Normal rate, regular rhythm and normal heart sounds.  Pulmonary/Chest: Effort normal and breath sounds normal.  Abdominal: Soft. Bowel sounds are normal.  She has a colostomy in the lower abdomen.  She has the fistula which is about 3 mm in size at the caudal end of her laparotomy scar.  The laparotomy scar is well  Musculoskeletal: Normal range of motion. She exhibits no edema, tenderness or deformity.  Lymphadenopathy:    She has no cervical adenopathy.  Neurological: She is alert and oriented to person, place, and time.  Skin: Skin is warm and dry. No rash noted. No erythema.  Psychiatric: She has a normal mood and affect. Her behavior is normal. Judgment and thought content normal.  Vitals reviewed.    Lab Results  Component Value Date   WBC 5.1 08/26/2017   HGB 11.7 08/26/2017   HCT 35.1 08/26/2017   MCV 89.8 08/26/2017   PLT 248 08/26/2017   Lab Results  Component Value Date   FERRITIN 253 07/01/2017   IRON 52 07/01/2017   TIBC 266 07/01/2017   UIBC 214 07/01/2017   IRONPCTSAT 20 (L) 07/01/2017   Lab Results  Component Value Date   RETICCTPCT 0.8 02/12/2015   RBC 3.91 08/26/2017   No results found for: KPAFRELGTCHN, LAMBDASER, KAPLAMBRATIO No results found for:  IGGSERUM, IGA, IGMSERUM No results found for: Odetta Pink, SPEI   Chemistry      Component Value Date/Time   NA 140 08/12/2017 0805   NA 145 03/27/2017 1101   NA 141 07/30/2016 0750   K 3.9 08/12/2017 0805   K 4.5 03/27/2017 1101   K 4.2 07/30/2016 0750   CL 104 08/12/2017 0805   CL 103 03/27/2017 1101   CO2 27 08/12/2017 0805   CO2 28 03/27/2017 1101   CO2 26 07/30/2016 0750   BUN 18 08/12/2017 0805   BUN 12 03/27/2017 1101   BUN 12.1 07/30/2016 0750   CREATININE 0.90 08/12/2017 0805   CREATININE 0.9 03/27/2017 1101   CREATININE 0.7 07/30/2016 0750      Component Value Date/Time   CALCIUM 9.5 08/12/2017 0805   CALCIUM 9.6 03/27/2017 1101   CALCIUM 9.5 07/30/2016 0750   ALKPHOS 109 (H) 08/12/2017 0805   ALKPHOS 101 (H) 03/27/2017 1101   ALKPHOS 72 07/30/2016 0750   AST 21 08/12/2017 0805   AST 22 07/30/2016 0750   ALT 21 08/12/2017 0805   ALT 20 03/27/2017 1101   ALT 22 07/30/2016 0750   BILITOT 0.7 08/12/2017 0805   BILITOT 0.54 07/30/2016 0750      Impression and Plan: Ms. Inlow is a very pleasant 47 yo caucasian female with recurrent colon cancer - HER2 (-)/KRAS (+)/MSI stable/MMR normal/BRAF -.   I will plan for her fourth cycle of treatment now.  I will then plan on a PET scan to be done in a week or so.  We will see what kind of response that she has had.  She is K-ras mutant so we cannot use EGFR inhibitors.  I spent about 35 minutes with she and her family   .  This was somewhat complicated given the pain issues that she is having and we need to address this aggressively so that she will have a better quality of life.    I will plan to see her back in another 2 weeks.    Volanda Napoleon, MD 6/4/20199:00 AM

## 2017-08-26 NOTE — Progress Notes (Signed)
No Avastin today per Dr. Marin Olp.

## 2017-08-27 ENCOUNTER — Ambulatory Visit (INDEPENDENT_AMBULATORY_CARE_PROVIDER_SITE_OTHER): Payer: BLUE CROSS/BLUE SHIELD | Admitting: Physician Assistant

## 2017-08-27 ENCOUNTER — Encounter: Payer: Self-pay | Admitting: Physician Assistant

## 2017-08-27 VITALS — BP 110/84 | HR 72 | Ht 65.0 in | Wt 152.4 lb

## 2017-08-27 DIAGNOSIS — C189 Malignant neoplasm of colon, unspecified: Secondary | ICD-10-CM

## 2017-08-27 DIAGNOSIS — K6289 Other specified diseases of anus and rectum: Secondary | ICD-10-CM

## 2017-08-27 NOTE — Patient Instructions (Signed)
Follow up with Nicoletta Ba or Dr. Havery Moros as needed.

## 2017-08-27 NOTE — Progress Notes (Signed)
Subjective:    Patient ID: Carla Mills, female    DOB: July 09, 1970, 47 y.o.   MRN: [id number]  HPI Carla Mills is a very nice 47 year old white female, known to Dr. Havery Moros, who comes in today with complaints of rectal pain. Patient has history of diverticular disease and prior diverticular abscess.  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  She is known to Dr. 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  Sigmoidoscopy done on 03/06/2017 showed severe stenosis of the rectum which could not be traversed, this area was 13 to 15 cm from the anal verge the tissue was ulcerated friable and malignant appearing. She ultimately required laparotomy and creation of a mucous fistula with end colostomy during that admission for of recurrent obstructing colon cancer which appeared to be involving the vagina. He has been undergoing chemotherapy and radiation and is followed by Dr. Marin Olp. Most recent PET scan done 07/24/2017 showed mixed response with decrease in disease burden and a decrease in soft tissue thickening and hypermetabolic tissue around the rectum.  There were new ileocolic mesenteric hyper metabolic nodes consistent with nodal mets. Patient has now developed rectal pain which has been progressive and at times severe.  This is been present primarily over the past month.  She is also had some ongoing issues with her stoma and feels that it is difficult to evacuate stool from the stoma. He had called here for appointment, and in the interim was also seen by Dr. Hassell Done who then did a sigmoidoscopy on 08/22/2017 and found a very friable rectal tumor at about 10 cm.   Staples were visible, as well as some suture.  Patient says after that procedure she has passed 2 staples. She has been started on long-acting MS Contin  which took her pain from a 10 to a 7, and then just within the past week has started Lyrica which she says is helping quite a bit and pain is now very tolerable and down to about a 3.  She is felt to have some sacral nerve involvement. She is also seen Dr. Marin Olp earlier this week to review findings.  He is working on having her referred to Dr. Morton Stall at [facility] for consideration of possible resection, and plan is to have a follow-up PET scan done after she finishes her current round of chemotherapy.  Patient states she kept the appointment here because she wants to be established, and to have GI in the loop.  She also knows that she will eventually need to have a follow-up colonoscopy. Review of Systems Pertinent positive and negative review of systems were noted in the above HPI section.  All other review of systems was otherwise negative.  Outpatient Encounter Medications as of 08/27/2017  Medication Sig  . dexamethasone (DECADRON) 4 MG tablet Take 2 tablets (8 mg total) by mouth daily. Start the day after chemo for 2 days.  . fluticasone (FLONASE) 50 MCG/ACT nasal spray Place 1 spray into both nostrils 2 (two) times daily.   Marland Kitchen LORazepam (ATIVAN) 1 MG tablet Take 1 tablet (1 mg total) by mouth every 6 (six) hours as needed (NAUSEA).  Marland Kitchen morphine (MS CONTIN) 30 MG 12 hr tablet Take 1 tablet (30 mg total) by mouth every 12 (twelve) hours.  Marland Kitchen morphine (MSIR) 15 MG tablet Take 0.5 tablets (7.5 mg total) by mouth every 4 (four) hours  as needed for severe pain.  Marland Kitchen ondansetron (ZOFRAN) 8 MG tablet Take 1 tablet (8 mg total) by mouth 2 (two) times daily as needed for refractory nausea / vomiting. Start on day 3 after chemotherapy.  . pregabalin (LYRICA) 75 MG capsule Take 1 capsule (75 mg total) by mouth 2 (two) times daily.  . prochlorperazine (COMPAZINE) 10 MG tablet Take 1 tablet (10 mg total) by mouth every 6 (six) hours as needed (NAUSEA).  . traMADol (ULTRAM) 50 MG tablet Take 1 tablet (50 mg total)  by mouth every 6 (six) hours as needed. (Patient taking differently: Take 50 mg by mouth every 6 (six) hours as needed for moderate pain. )   Facility-Administered Encounter Medications as of 08/27/2017  Medication  . dextrose 5 % solution  . [DISCONTINUED] 0.9 %  sodium chloride infusion  . [DISCONTINUED] atropine injection 0.5 mg  . [DISCONTINUED] fluorouracil (ADRUCIL) 5,100 mg in sodium chloride 0.9 % 148 mL chemo infusion   Allergies  Allergen Reactions  . Capecitabine Hives and Other (See Comments)    Legs and arms, after completing therapy  . Bactrim [Sulfamethoxazole-Trimethoprim] Swelling and Other (See Comments)    Severe swelling  . Oxaprozin Hives  . Penicillins Hives and Other (See Comments)    Has patient had a PCN reaction causing immediate rash, facial/tongue/throat swelling, SOB or lightheadedness with hypotension: no Has patient had a PCN reaction causing severe rash involving mucus membranes or skin necrosis: {no Has patient had a PCN reaction that required hospitalization no Has patient had a PCN reaction occurring within the last 10 years: no If all of the above answers are "NO", then may proceed with Cephalosporin use.  . Sulfonamide Derivatives Swelling and Other (See Comments)    Massive extremity swelling    Patient Active Problem List   Diagnosis Date Noted  . Palliative care by specialist   . Cancer associated pain   . DNR (do not resuscitate) discussion   . Acute blood loss anemia   . 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 History of colon cancer 03/03/2017  . Abdominal pain 03/03/2017  . Intractable nausea and vomiting 05/25/2016  . Anxiety state 05/25/2016  . Acute colitis 05/24/2016  . Cancer of sigmoid colon metastatic to intra-abdominal lymph node ([place]) 04/03/2016  . 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 Metastatic colon cancer in female [facility]) 03/09/2015  . Colonic diverticular abscess 02/10/2015  . Constipation 02/10/2015  . Anemia, iron  deficiency 02/10/2015  . Liver lesion 02/10/2015  . ACUTE PHARYNGITIS 12/23/2009  . VIRAL URI 12/23/2009   Social History   Socioeconomic History  . Marital status: Married    Spouse name: Not on file  . Number of children: Not on file  . Years of education: Not on file  . Highest education level: Not on file  Occupational History  . Not on file  Social Needs  . Financial resource strain: Not on file  . Food insecurity:    Worry: Not on file    Inability: Not on file  . Transportation needs:    Medical: Not on file    Non-medical: Not on file  Tobacco Use  . Smoking status: Never Smoker  . Smokeless tobacco: Never Used  Substance and Sexual Activity  . Alcohol use: Yes    Alcohol/week: 0.0 oz    Comment: socially   . Drug use: No  . Sexual activity: Not on file  Lifestyle  . Physical activity:    Days  per week: Not on file    Minutes per session: Not on file  . Stress: Not on file  Relationships  . Social connections:    Talks on phone: Not on file    Gets together: Not on file    Attends religious service: Not on file    Active member of club or organization: Not on file    Attends meetings of clubs or organizations: Not on file    Relationship status: Not on file  . Intimate partner violence:    Fear of current or ex partner: Not on file    Emotionally abused: Not on file    Physically abused: Not on file    Forced sexual activity: Not on file  Other Topics Concern  . Not on file  Social History Narrative  . Not on file    Ms. Hovatter's family history is not on file.      Objective:    Vitals:   08/27/17 1404  BP: 110/84  Pulse: 72    Physical Exam; well-developed white female in no acute distress, accompanied by her mother both very pleasant blood pressure 110/84 pulse 72, height 5 foot 5, weight 152, BMI 25.3.  HEENT; nontraumatic normocephalic EOMI PERRLA sclera anicteric, Oropharynx clear, Cardiovascular; regular rate and rhythm with S1-S2,  Port-A-Cath in chest wall, Pulmonary ;clear bilaterally, Abdomen; soft, bowel sounds are present she has a colostomy in the left mid quadrant, there is some fullness in the right mid quadrant, she is nontender.  Rectal ;exam not done-see recent sigmoidoscopy per Dr. Hassell Done, extremities no clubbing cyanosis or edema skin warm and dry, Neuro psych; alert and oriented, grossly nonfocal mood and affect appropriate, she is in good spirits as usual       Assessment & Plan:   #26 47 year old white female with metastatic adenocarcinoma of the colon, recurrent, with obstructing rectal lesion found December 2018.  She is status post laparotomy with mucous fistula and end colostomy December 2018.  She is undergone chemotherapy and radiation since and continues on chemotherapy. Patient had developed rectal pain which had been progressive over the past month. Sigmoidoscopy per Dr. Hassell Done 08/22/2017 with finding of ulcerated rectal tumor at about 10 cm. PET scan 07/24/2017 showing mixed response with decrease in disease burden and decrease in soft tissue surrounding the rectum but new ileocolic mesenteric hypermetabolic nodes consistent with nodal mets.  Currently patient has fairly good pain control on MS Contin and Lyrica. Probable sacral nerve involvement.  Plan; No role for endoscopic intervention at this time. Patient is awaiting referral to [facility]/Dr. Waters/Surgical Oncology for consideration of resection of the rectal tumor, pending findings at repeat PET scan later this month.  We will be available to help as needed.  Eventually she may need follow-up colonoscopy, and this may be requested preoperatively.  Will await consultation with Dr. Morton Stall.    Brigg Cape S Viviane Semidey PA-C 08/27/2017   Cc: No ref. provider found

## 2017-08-28 ENCOUNTER — Inpatient Hospital Stay: Payer: BLUE CROSS/BLUE SHIELD

## 2017-08-28 VITALS — BP 125/50 | HR 74 | Temp 97.9°F | Resp 16

## 2017-08-28 DIAGNOSIS — C189 Malignant neoplasm of colon, unspecified: Secondary | ICD-10-CM

## 2017-08-28 DIAGNOSIS — Z5111 Encounter for antineoplastic chemotherapy: Secondary | ICD-10-CM | POA: Diagnosis not present

## 2017-08-28 DIAGNOSIS — C187 Malignant neoplasm of sigmoid colon: Secondary | ICD-10-CM

## 2017-08-28 DIAGNOSIS — C772 Secondary and unspecified malignant neoplasm of intra-abdominal lymph nodes: Principal | ICD-10-CM

## 2017-08-28 MED ORDER — SODIUM CHLORIDE 0.9% FLUSH
10.0000 mL | INTRAVENOUS | Status: DC | PRN
  Administered 2017-08-28: 10 mL
  Filled 2017-08-28: qty 10

## 2017-08-28 MED ORDER — HEPARIN SOD (PORK) LOCK FLUSH 100 UNIT/ML IV SOLN
500.0000 [IU] | Freq: Once | INTRAVENOUS | Status: AC | PRN
  Administered 2017-08-28: 500 [IU]
  Filled 2017-08-28: qty 5

## 2017-08-28 NOTE — Patient Instructions (Signed)
Fluorouracil, 5-FU injection What is this medicine? FLUOROURACIL, 5-FU (flure oh YOOR a sil) is a chemotherapy drug. It slows the growth of cancer cells. This medicine is used to treat many types of cancer like breast cancer, colon or rectal cancer, pancreatic cancer, and stomach cancer. This medicine may be used for other purposes; ask your health care provider or pharmacist if you have questions. COMMON BRAND NAME(S): Adrucil What should I tell my health care provider before I take this medicine? They need to know if you have any of these conditions: -blood disorders -dihydropyrimidine dehydrogenase (DPD) deficiency -infection (especially a virus infection such as chickenpox, cold sores, or herpes) -kidney disease -liver disease -malnourished, poor nutrition -recent or ongoing radiation therapy -an unusual or allergic reaction to fluorouracil, other chemotherapy, other medicines, foods, dyes, or preservatives -pregnant or trying to get pregnant -breast-feeding How should I use this medicine? This drug is given as an infusion or injection into a vein. It is administered in a hospital or clinic by a specially trained health care professional. Talk to your pediatrician regarding the use of this medicine in children. Special care may be needed. Overdosage: If you think you have taken too much of this medicine contact a poison control center or emergency room at once. NOTE: This medicine is only for you. Do not share this medicine with others. What if I miss a dose? It is important not to miss your dose. Call your doctor or health care professional if you are unable to keep an appointment. What may interact with this medicine? -allopurinol -cimetidine -dapsone -digoxin -hydroxyurea -leucovorin -levamisole -medicines for seizures like ethotoin, fosphenytoin, phenytoin -medicines to increase blood counts like filgrastim, pegfilgrastim, sargramostim -medicines that treat or prevent blood  clots like warfarin, enoxaparin, and dalteparin -methotrexate -metronidazole -pyrimethamine -some other chemotherapy drugs like busulfan, cisplatin, estramustine, vinblastine -trimethoprim -trimetrexate -vaccines Talk to your doctor or health care professional before taking any of these medicines: -acetaminophen -aspirin -ibuprofen -ketoprofen -naproxen This list may not describe all possible interactions. Give your health care provider a list of all the medicines, herbs, non-prescription drugs, or dietary supplements you use. Also tell them if you smoke, drink alcohol, or use illegal drugs. Some items may interact with your medicine. What should I watch for while using this medicine? Visit your doctor for checks on your progress. This drug may make you feel generally unwell. This is not uncommon, as chemotherapy can affect healthy cells as well as cancer cells. Report any side effects. Continue your course of treatment even though you feel ill unless your doctor tells you to stop. In some cases, you may be given additional medicines to help with side effects. Follow all directions for their use. Call your doctor or health care professional for advice if you get a fever, chills or sore throat, or other symptoms of a cold or flu. Do not treat yourself. This drug decreases your body's ability to fight infections. Try to avoid being around people who are sick. This medicine may increase your risk to bruise or bleed. Call your doctor or health care professional if you notice any unusual bleeding. Be careful brushing and flossing your teeth or using a toothpick because you may get an infection or bleed more easily. If you have any dental work done, tell your dentist you are receiving this medicine. Avoid taking products that contain aspirin, acetaminophen, ibuprofen, naproxen, or ketoprofen unless instructed by your doctor. These medicines may hide a fever. Do not become pregnant while taking this    medicine. Women should inform their doctor if they wish to become pregnant or think they might be pregnant. There is a potential for serious side effects to an unborn child. Talk to your health care professional or pharmacist for more information. Do not breast-feed an infant while taking this medicine. Men should inform their doctor if they wish to father a child. This medicine may lower sperm counts. Do not treat diarrhea with over the counter products. Contact your doctor if you have diarrhea that lasts more than 2 days or if it is severe and watery. This medicine can make you more sensitive to the sun. Keep out of the sun. If you cannot avoid being in the sun, wear protective clothing and use sunscreen. Do not use sun lamps or tanning beds/booths. What side effects may I notice from receiving this medicine? Side effects that you should report to your doctor or health care professional as soon as possible: -allergic reactions like skin rash, itching or hives, swelling of the face, lips, or tongue -low blood counts - this medicine may decrease the number of white blood cells, red blood cells and platelets. You may be at increased risk for infections and bleeding. -signs of infection - fever or chills, cough, sore throat, pain or difficulty passing urine -signs of decreased platelets or bleeding - bruising, pinpoint red spots on the skin, black, tarry stools, blood in the urine -signs of decreased red blood cells - unusually weak or tired, fainting spells, lightheadedness -breathing problems -changes in vision -chest pain -mouth sores -nausea and vomiting -pain, swelling, redness at site where injected -pain, tingling, numbness in the hands or feet -redness, swelling, or sores on hands or feet -stomach pain -unusual bleeding Side effects that usually do not require medical attention (report to your doctor or health care professional if they continue or are bothersome): -changes in finger or  toe nails -diarrhea -dry or itchy skin -hair loss -headache -loss of appetite -sensitivity of eyes to the light -stomach upset -unusually teary eyes This list may not describe all possible side effects. Call your doctor for medical advice about side effects. You may report side effects to FDA at 1-800-FDA-1088. Where should I keep my medicine? This drug is given in a hospital or clinic and will not be stored at home. NOTE: This sheet is a summary. It may not cover all possible information. If you have questions about this medicine, talk to your doctor, pharmacist, or health care provider.  2018 Elsevier/Gold Standard (2007-07-15 13:53:16)  

## 2017-09-03 ENCOUNTER — Other Ambulatory Visit: Payer: Self-pay | Admitting: *Deleted

## 2017-09-03 MED ORDER — MORPHINE SULFATE ER 30 MG PO TBCR: 30.0000 mg | EXTENDED_RELEASE_TABLET | Freq: Two times a day (BID) | ORAL | 0 refills | Status: DC

## 2017-09-03 MED FILL — MORPHINE SULF ER 30 MG TAB: 30 | 30 days supply | Qty: 60 | Fill #0

## 2017-09-03 NOTE — Progress Notes (Signed)
Agree with assessment and plan as outlined.  

## 2017-09-04 ENCOUNTER — Encounter: Payer: Self-pay | Admitting: Family

## 2017-09-04 ENCOUNTER — Other Ambulatory Visit: Payer: Self-pay | Admitting: Family

## 2017-09-08 ENCOUNTER — Telehealth: Payer: Self-pay | Admitting: Hematology & Oncology

## 2017-09-08 ENCOUNTER — Other Ambulatory Visit: Payer: Self-pay | Admitting: *Deleted

## 2017-09-08 DIAGNOSIS — C189 Malignant neoplasm of colon, unspecified: Secondary | ICD-10-CM

## 2017-09-08 NOTE — Telephone Encounter (Signed)
sw pt to confirm PET scan 6/24 at 0630 at [facility]

## 2017-09-09 ENCOUNTER — Other Ambulatory Visit: Payer: Self-pay

## 2017-09-09 ENCOUNTER — Encounter: Payer: Self-pay | Admitting: Hematology & Oncology

## 2017-09-09 ENCOUNTER — Inpatient Hospital Stay: Payer: BLUE CROSS/BLUE SHIELD

## 2017-09-09 ENCOUNTER — Inpatient Hospital Stay (HOSPITAL_BASED_OUTPATIENT_CLINIC_OR_DEPARTMENT_OTHER): Payer: BLUE CROSS/BLUE SHIELD | Admitting: Hematology & Oncology

## 2017-09-09 VITALS — BP 108/65 | HR 80 | Temp 97.6°F | Resp 18 | Wt 153.0 lb

## 2017-09-09 DIAGNOSIS — C189 Malignant neoplasm of colon, unspecified: Secondary | ICD-10-CM

## 2017-09-09 DIAGNOSIS — C187 Malignant neoplasm of sigmoid colon: Secondary | ICD-10-CM

## 2017-09-09 DIAGNOSIS — C772 Secondary and unspecified malignant neoplasm of intra-abdominal lymph nodes: Principal | ICD-10-CM

## 2017-09-09 DIAGNOSIS — Z933 Colostomy status: Secondary | ICD-10-CM | POA: Diagnosis not present

## 2017-09-09 DIAGNOSIS — Z5111 Encounter for antineoplastic chemotherapy: Secondary | ICD-10-CM | POA: Diagnosis not present

## 2017-09-09 LAB — CBC WITH DIFFERENTIAL (CANCER CENTER ONLY)
BASOS ABS: 0 10*3/uL (ref 0.0–0.1)
Basophils Relative: 0 %
Eosinophils Absolute: 0.1 10*3/uL (ref 0.0–0.5)
Eosinophils Relative: 2 %
HEMATOCRIT: 34.1 % — AB (ref 34.8–46.6)
HEMOGLOBIN: 11 g/dL — AB (ref 11.6–15.9)
LYMPHS ABS: 0.5 10*3/uL — AB (ref 0.9–3.3)
LYMPHS PCT: 9 %
MCH: 29 pg (ref 26.0–34.0)
MCHC: 32.3 g/dL (ref 32.0–36.0)
MCV: 90 fL (ref 81.0–101.0)
Monocytes Absolute: 0.8 10*3/uL (ref 0.1–0.9)
Monocytes Relative: 15 %
NEUTROS ABS: 3.9 10*3/uL (ref 1.5–6.5)
NEUTROS PCT: 74 %
PLATELETS: 235 10*3/uL (ref 145–400)
RBC: 3.79 MIL/uL (ref 3.70–5.32)
RDW: 15.1 % (ref 11.1–15.7)
WBC: 5.3 10*3/uL (ref 3.9–10.0)

## 2017-09-09 LAB — CMP (CANCER CENTER ONLY)
ALK PHOS: 83 U/L (ref 26–84)
ALT: 22 U/L (ref 10–47)
ANION GAP: 10 (ref 5–15)
AST: 23 U/L (ref 11–38)
Albumin: 3 g/dL — ABNORMAL LOW (ref 3.5–5.0)
BILIRUBIN TOTAL: 0.5 mg/dL (ref 0.2–1.6)
BUN: 12 mg/dL (ref 7–22)
CALCIUM: 9.4 mg/dL (ref 8.0–10.3)
CO2: 28 mmol/L (ref 18–33)
Chloride: 105 mmol/L (ref 98–108)
Creatinine: 0.9 mg/dL (ref 0.60–1.20)
GLUCOSE: 75 mg/dL (ref 73–118)
Potassium: 3.9 mmol/L (ref 3.3–4.7)
Sodium: 143 mmol/L (ref 128–145)
TOTAL PROTEIN: 6.8 g/dL (ref 6.4–8.1)

## 2017-09-09 LAB — CEA (IN HOUSE-CHCC): CEA (CHCC-IN HOUSE): 1.95 ng/mL (ref 0.00–5.00)

## 2017-09-09 LAB — TOTAL PROTEIN, URINE DIPSTICK: PROTEIN: NEGATIVE mg/dL

## 2017-09-09 MED ORDER — SODIUM CHLORIDE 0.9 % IV SOLN
5.0000 mg/kg | Freq: Once | INTRAVENOUS | Status: AC
  Administered 2017-09-09: 350 mg via INTRAVENOUS
  Filled 2017-09-09: qty 14

## 2017-09-09 MED ORDER — IRINOTECAN HCL CHEMO INJECTION 100 MG/5ML
132.0000 mg/m2 | Freq: Once | INTRAVENOUS | Status: AC
  Administered 2017-09-09: 240 mg via INTRAVENOUS
  Filled 2017-09-09: qty 10

## 2017-09-09 MED ORDER — DEXTROSE 5 % IV SOLN
Freq: Once | INTRAVENOUS | Status: AC
  Administered 2017-09-09: 11:00:00 via INTRAVENOUS

## 2017-09-09 MED ORDER — PALONOSETRON HCL INJECTION 0.25 MG/5ML
INTRAVENOUS | Status: AC
  Filled 2017-09-09: qty 5

## 2017-09-09 MED ORDER — DEXAMETHASONE SODIUM PHOSPHATE 10 MG/ML IJ SOLN
INTRAMUSCULAR | Status: AC
  Filled 2017-09-09: qty 1

## 2017-09-09 MED ORDER — SODIUM CHLORIDE 0.9 % IV SOLN
2900.0000 mg/m2 | INTRAVENOUS | Status: DC
  Administered 2017-09-09: 5100 mg via INTRAVENOUS
  Filled 2017-09-09: qty 102

## 2017-09-09 MED ORDER — DEXAMETHASONE SODIUM PHOSPHATE 10 MG/ML IJ SOLN
10.0000 mg | Freq: Once | INTRAMUSCULAR | Status: AC
Start: 2017-09-09 — End: 2017-09-09
  Administered 2017-09-09: 10 mg via INTRAVENOUS

## 2017-09-09 MED ORDER — SODIUM CHLORIDE 0.9 % IV SOLN
Freq: Once | INTRAVENOUS | Status: AC
  Administered 2017-09-09: 09:00:00 via INTRAVENOUS

## 2017-09-09 MED ORDER — PALONOSETRON HCL INJECTION 0.25 MG/5ML
0.2500 mg | Freq: Once | INTRAVENOUS | Status: AC
Start: 2017-09-09 — End: 2017-09-09
  Administered 2017-09-09: 0.25 mg via INTRAVENOUS

## 2017-09-09 MED ORDER — LEUCOVORIN CALCIUM INJECTION 350 MG
200.0000 mg/m2 | Freq: Once | INTRAVENOUS | Status: AC
  Administered 2017-09-09: 350 mg via INTRAVENOUS
  Filled 2017-09-09: qty 17.5

## 2017-09-09 MED ORDER — SODIUM CHLORIDE 0.9% FLUSH
10.0000 mL | INTRAVENOUS | Status: DC | PRN
  Administered 2017-09-09: 10 mL
  Filled 2017-09-09: qty 10

## 2017-09-09 MED ORDER — ATROPINE SULFATE 1 MG/ML IJ SOLN: 0.5000 mg | Freq: Once | INTRAMUSCULAR | Status: DC | PRN

## 2017-09-09 MED ORDER — HEPARIN SOD (PORK) LOCK FLUSH 100 UNIT/ML IV SOLN
500.0000 [IU] | Freq: Once | INTRAVENOUS | Status: DC | PRN
  Filled 2017-09-09: qty 5

## 2017-09-09 NOTE — Patient Instructions (Signed)
[facility] Discharge Instructions for Patients Receiving Chemotherapy  Today you received the following chemotherapy agents Avastin, Irinotecan, Leucovorin, U.  To help prevent nausea and vomiting after your treatment, we encourage you to take your nausea medication as prescribed by your MD.   If you develop nausea and vomiting that is not controlled by your nausea medication, call the clinic.   BELOW ARE SYMPTOMS THAT SHOULD BE REPORTED IMMEDIATELY:  *FEVER GREATER THAN 100.5 F  *CHILLS WITH OR WITHOUT FEVER  NAUSEA AND VOMITING THAT IS NOT CONTROLLED WITH YOUR NAUSEA MEDICATION  *UNUSUAL SHORTNESS OF BREATH  *UNUSUAL BRUISING OR BLEEDING  TENDERNESS IN MOUTH AND THROAT WITH OR WITHOUT PRESENCE OF ULCERS  *URINARY PROBLEMS  *BOWEL PROBLEMS  UNUSUAL RASH Items with * indicate a potential emergency and should be followed up as soon as possible.  Feel free to call the clinic should you have any questions or concerns. The clinic phone number is [phone].  Please show the [place] at check-in to the Emergency Department and triage nurse.

## 2017-09-09 NOTE — Progress Notes (Signed)
09/09/17  Confirmed with Dr Marin Olp patient is to receive Avastin today.  Previous dose had been held.    V.O. Dr Rebeca Alert, PharmD

## 2017-09-11 ENCOUNTER — Encounter: Payer: Self-pay | Admitting: Radiation Oncology

## 2017-09-11 ENCOUNTER — Inpatient Hospital Stay: Payer: BLUE CROSS/BLUE SHIELD

## 2017-09-11 ENCOUNTER — Other Ambulatory Visit: Payer: Self-pay

## 2017-09-11 VITALS — BP 122/76 | HR 72 | Temp 98.0°F | Resp 18

## 2017-09-11 DIAGNOSIS — Z5111 Encounter for antineoplastic chemotherapy: Secondary | ICD-10-CM | POA: Diagnosis not present

## 2017-09-11 DIAGNOSIS — C187 Malignant neoplasm of sigmoid colon: Secondary | ICD-10-CM

## 2017-09-11 DIAGNOSIS — C189 Malignant neoplasm of colon, unspecified: Secondary | ICD-10-CM | POA: Diagnosis not present

## 2017-09-11 DIAGNOSIS — C772 Secondary and unspecified malignant neoplasm of intra-abdominal lymph nodes: Principal | ICD-10-CM

## 2017-09-11 MED ORDER — HEPARIN SOD (PORK) LOCK FLUSH 100 UNIT/ML IV SOLN
500.0000 [IU] | Freq: Once | INTRAVENOUS | Status: AC | PRN
  Administered 2017-09-11: 500 [IU]
  Filled 2017-09-11: qty 5

## 2017-09-11 MED ORDER — SODIUM CHLORIDE 0.9% FLUSH
10.0000 mL | INTRAVENOUS | Status: DC | PRN
  Administered 2017-09-11: 10 mL
  Filled 2017-09-11: qty 10

## 2017-09-11 NOTE — Patient Instructions (Signed)
Implanted Port Home Guide An implanted port is a type of central line that is placed under the skin. Central lines are used to provide IV access when treatment or nutrition needs to be given through a person's veins. Implanted ports are used for long-term IV access. An implanted port may be placed because:  You need IV medicine that would be irritating to the small veins in your hands or arms.  You need long-term IV medicines, such as antibiotics.  You need IV nutrition for a long period.  You need frequent blood draws for lab tests.  You need dialysis.  Implanted ports are usually placed in the chest area, but they can also be placed in the upper arm, the abdomen, or the leg. An implanted port has two main parts:  Reservoir. The reservoir is round and will appear as a small, raised area under your skin. The reservoir is the part where a needle is inserted to give medicines or draw blood.  Catheter. The catheter is a thin, flexible tube that extends from the reservoir. The catheter is placed into a large vein. Medicine that is inserted into the reservoir goes into the catheter and then into the vein.  How will I care for my incision site? Do not get the incision site wet. Bathe or shower as directed by your health care provider. How is my port accessed? Special steps must be taken to access the port:  Before the port is accessed, a numbing cream can be placed on the skin. This helps numb the skin over the port site.  Your health care provider uses a sterile technique to access the port. ? Your health care provider must put on a mask and sterile gloves. ? The skin over your port is cleaned carefully with an antiseptic and allowed to dry. ? The port is gently pinched between sterile gloves, and a needle is inserted into the port.  Only "non-coring" port needles should be used to access the port. Once the port is accessed, a blood return should be checked. This helps ensure that the port  is in the vein and is not clogged.  If your port needs to remain accessed for a constant infusion, a clear (transparent) bandage will be placed over the needle site. The bandage and needle will need to be changed every week, or as directed by your health care provider.  Keep the bandage covering the needle clean and dry. Do not get it wet. Follow your health care provider's instructions on how to take a shower or bath while the port is accessed.  If your port does not need to stay accessed, no bandage is needed over the port.  What is flushing? Flushing helps keep the port from getting clogged. Follow your health care provider's instructions on how and when to flush the port. Ports are usually flushed with saline solution or a medicine called heparin. The need for flushing will depend on how the port is used.  If the port is used for intermittent medicines or blood draws, the port will need to be flushed: ? After medicines have been given. ? After blood has been drawn. ? As part of routine maintenance.  If a constant infusion is running, the port may not need to be flushed.  How long will my port stay implanted? The port can stay in for as long as your health care provider thinks it is needed. When it is time for the port to come out, surgery will be   done to remove it. The procedure is similar to the one performed when the port was put in. When should I seek immediate medical care? When you have an implanted port, you should seek immediate medical care if:  You notice a bad smell coming from the incision site.  You have swelling, redness, or drainage at the incision site.  You have more swelling or pain at the port site or the surrounding area.  You have a fever that is not controlled with medicine.  This information is not intended to replace advice given to you by your health care provider. Make sure you discuss any questions you have with your health care provider. Document  Released: 03/11/2005 Document Revised: 08/17/2015 Document Reviewed: 11/16/2012 Elsevier Interactive Patient Education  2017 [employer]  

## 2017-09-14 DIAGNOSIS — C189 Malignant neoplasm of colon, unspecified: Secondary | ICD-10-CM | POA: Diagnosis not present

## 2017-09-15 ENCOUNTER — Ambulatory Visit (HOSPITAL_COMMUNITY)
Admission: RE | Admit: 2017-09-15 | Discharge: 2017-09-15 | Disposition: A | Discharge status: Home / Self Care | Payer: BLUE CROSS/BLUE SHIELD | Source: Ambulatory Visit | Attending: Hematology & Oncology | Admitting: Hematology & Oncology

## 2017-09-15 DIAGNOSIS — C189 Malignant neoplasm of colon, unspecified: Secondary | ICD-10-CM | POA: Insufficient documentation

## 2017-09-15 DIAGNOSIS — Z933 Colostomy status: Secondary | ICD-10-CM | POA: Insufficient documentation

## 2017-09-15 LAB — GLUCOSE, CAPILLARY: Glucose-Capillary: 94 mg/dL (ref 65–99)

## 2017-09-15 MED ORDER — FLUDEOXYGLUCOSE F - 18 (FDG) INJECTION
7.7000 | Freq: Once | INTRAVENOUS | Status: AC | PRN
  Administered 2017-09-15: 7.7 via INTRAVENOUS

## 2017-09-15 NOTE — Progress Notes (Signed)
Hematology and Oncology Follow Up Visit  Carla Mills [id number] 1971-03-17 47 y.o. 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 is back for follow-up.  Unfortunately, she is having more problems with pain.  This seems to happen when she sits or stands.  She feels as if a tumor is dropping into her pelvis.  Unfortunately, we still not have gotten the PET scan.  Her insurance company would not let us do one.  We are having to do a written appeal.  Until she has the PET scan, she cannot be seen by radiation therapy.  I still not yet totally ruled out surgery.  However, I would not think that this is going to be an option for [place].  Overall, she seems to be doing all right.  She is had no cough.  She is had no bleeding.  She is had no leg swelling.  She has had no fever.  I will also try her on some Lyrica.  She may have a neuropathic type component of the discomfort.  Overall, her performance status is ECOG 1.   Medications:  Allergies as of 09/09/2017      Reactions   Capecitabine Hives, Other (See Comments)   Legs and arms, after completing therapy   Bactrim [sulfamethoxazole-trimethoprim] Swelling, Other (See Comments)   Severe swelling   Oxaprozin Hives   Penicillins Hives, Other (See Comments)   Has patient had a PCN reaction causing immediate rash, facial/tongue/throat swelling, SOB or lightheadedness with hypotension: no Has patient had a PCN reaction causing severe rash involving mucus membranes or skin necrosis: {no Has patient had a PCN reaction that required hospitalization no Has patient had a PCN reaction occurring  within the last 10 years: no If all of the above answers are "NO", then may proceed with Cephalosporin use.   Sulfonamide Derivatives Swelling, Other (See Comments)   Massive extremity swelling       Medication List        Accurate as of 09/09/17 11:59 PM. Always use your most recent med list.          dexamethasone 4 MG tablet Commonly known as:  DECADRON Take 2 tablets (8 mg total) by mouth daily. Start the day after chemo for 2 days.   fluticasone 50 MCG/ACT nasal spray Commonly known as:  FLONASE Place 1 spray into both nostrils 2 (two) times daily.   LORazepam 1 MG tablet Commonly known as:  ATIVAN Take 1 tablet (1 mg total) by mouth every 6 (six) hours as needed (NAUSEA).   morphine 15 MG tablet Commonly known as:  MSIR Take 0.5 tablets (7.5 mg total) by mouth every 4 (four) hours as needed for severe pain.   morphine 30 MG 12 hr tablet Commonly known as:  MS CONTIN Take 1 tablet (30 mg total) by mouth every 12 (twelve) hours.   ondansetron 8 MG tablet Commonly known as:  ZOFRAN Take 1 tablet (8 mg total) by mouth 2 (two) times daily as needed for refractory nausea / vomiting. Start on day 3 after chemotherapy.   pregabalin 75 MG capsule Commonly known as:  LYRICA Take 1 capsule (75 mg total) by mouth 2 (  two) times daily.   prochlorperazine 10 MG tablet Commonly known as:  COMPAZINE Take 1 tablet (10 mg total) by mouth every 6 (six) hours as needed (NAUSEA).   traMADol 50 MG tablet Commonly known as:  ULTRAM Take 1 tablet (50 mg total) by mouth every 6 (six) hours as needed.       Allergies:  Allergies  Allergen Reactions  . Capecitabine Hives and Other (See Comments)    Legs and arms, after completing therapy  . Bactrim [Sulfamethoxazole-Trimethoprim] Swelling and Other (See Comments)    Severe swelling  . Oxaprozin Hives  . Penicillins Hives and Other (See Comments)    Has patient had a PCN reaction causing immediate rash, facial/tongue/throat  swelling, SOB or lightheadedness with hypotension: no Has patient had a PCN reaction causing severe rash involving mucus membranes or skin necrosis: {no Has patient had a PCN reaction that required hospitalization no Has patient had a PCN reaction occurring within the last 10 years: no If all of the above answers are "NO", then may proceed with Cephalosporin use.  . Sulfonamide Derivatives Swelling and Other (See Comments)    Massive extremity swelling     Past Medical History, Surgical history, Social history, and Family History were reviewed and updated.  Review of Systems: Review of Systems  Constitutional: Negative.   HENT: Negative.   Eyes: Negative.   Respiratory: Negative.   Cardiovascular: Negative.   Gastrointestinal: Negative.   Genitourinary: Negative.   Musculoskeletal: Negative.   Skin: Negative.   Neurological: Negative.   Endo/Heme/Allergies: Negative.   Psychiatric/Behavioral: Negative.   All other systems reviewed and are negative.   Physical Exam:  weight is 153 lb (69.4 kg). Her oral temperature is 97.6 F (36.4 C). Her blood pressure is 108/65 and her pulse is 80. Her respiration is 18 and oxygen saturation is 100%.   Wt Readings from Last 3 Encounters:  09/09/17 153 lb (69.4 kg)  08/27/17 152 lb 6.4 oz (69.1 kg)  08/26/17 150 lb (68 kg)    Physical Exam  Constitutional: She is oriented to person, place, and time.  HENT:  Head: Normocephalic and atraumatic.  Mouth/Throat: Oropharynx is clear and moist.  Eyes: Pupils are equal, round, and reactive to light. EOM are normal.  Neck: Normal range of motion.  Cardiovascular: Normal rate, regular rhythm and normal heart sounds.  Pulmonary/Chest: Effort normal and breath sounds normal.  Abdominal: Soft. Bowel sounds are normal.  She has a colostomy in the lower abdomen.  She has the fistula which is about 3 mm in size at the caudal end of her laparotomy scar.  The laparotomy scar is well  Musculoskeletal:  Normal range of motion. She exhibits no edema, tenderness or deformity.  Lymphadenopathy:    She has no cervical adenopathy.  Neurological: She is alert and oriented to person, place, and time.  Skin: Skin is warm and dry. No rash noted. No erythema.  Psychiatric: She has a normal mood and affect. Her behavior is normal. Judgment and thought content normal.  Vitals reviewed.    Lab Results  Component Value Date   WBC 5.3 09/09/2017   HGB 11.0 (L) 09/09/2017   HCT 34.1 (L) 09/09/2017   MCV 90.0 09/09/2017   PLT 235 09/09/2017   Lab Results  Component Value Date   FERRITIN 253 07/01/2017   IRON 52 07/01/2017   TIBC 266 07/01/2017   UIBC 214 07/01/2017   IRONPCTSAT 20 (L) 07/01/2017   Lab Results  Component Value Date  RETICCTPCT 0.8 02/12/2015   RBC 3.79 09/09/2017   No results found for: KPAFRELGTCHN, LAMBDASER, KAPLAMBRATIO No results found for: IGGSERUM, IGA, IGMSERUM No results found for: Odetta Pink, SPEI   Chemistry      Component Value Date/Time   NA 143 09/09/2017 0755   NA 145 03/27/2017 1101   NA 141 07/30/2016 0750   K 3.9 09/09/2017 0755   K 4.5 03/27/2017 1101   K 4.2 07/30/2016 0750   CL 105 09/09/2017 0755   CL 103 03/27/2017 1101   CO2 28 09/09/2017 0755   CO2 28 03/27/2017 1101   CO2 26 07/30/2016 0750   BUN 12 09/09/2017 0755   BUN 12 03/27/2017 1101   BUN 12.1 07/30/2016 0750   CREATININE 0.90 09/09/2017 0755   CREATININE 0.9 03/27/2017 1101   CREATININE 0.7 07/30/2016 0750      Component Value Date/Time   CALCIUM 9.4 09/09/2017 0755   CALCIUM 9.6 03/27/2017 1101   CALCIUM 9.5 07/30/2016 0750   ALKPHOS 83 09/09/2017 0755   ALKPHOS 101 (H) 03/27/2017 1101   ALKPHOS 72 07/30/2016 0750   AST 23 09/09/2017 0755   AST 22 07/30/2016 0750   ALT 22 09/09/2017 0755   ALT 20 03/27/2017 1101   ALT 22 07/30/2016 0750   BILITOT 0.5 09/09/2017 0755   BILITOT 0.54 07/30/2016 0750       Impression and Plan: Ms. Segar is a very pleasant 47 yo caucasian female with recurrent colon cancer - HER2 (-)/KRAS (+)/MSI stable/MMR normal/BRAF -.   We will still proceed with her fifth cycle of treatment.  Hopefully, the PET scan will get done.  We will have her come back in another couple weeks.  Hopefully, we will be able to see if radiation can be done.  Volanda Napoleon, MD 6/24/20198:20 AM

## 2017-09-18 ENCOUNTER — Ambulatory Visit: Payer: BLUE CROSS/BLUE SHIELD

## 2017-09-18 ENCOUNTER — Ambulatory Visit: Payer: BLUE CROSS/BLUE SHIELD | Admitting: Radiation Oncology

## 2017-09-18 DIAGNOSIS — Z85038 Personal history of other malignant neoplasm of large intestine: Secondary | ICD-10-CM | POA: Diagnosis not present

## 2017-09-18 DIAGNOSIS — Z933 Colostomy status: Secondary | ICD-10-CM | POA: Diagnosis not present

## 2017-09-18 MED FILL — DEXAMETHASONE 4 MG TABLET: 4 | 6 days supply | Qty: 12 | Fill #1

## 2017-09-19 ENCOUNTER — Encounter: Payer: Self-pay | Admitting: Pharmacist

## 2017-09-19 NOTE — Progress Notes (Signed)
Avastin will be held 09/22/17 per Dr. Marin Olp. Avastin orders for that date have been deferred in the [place].

## 2017-09-21 ENCOUNTER — Other Ambulatory Visit: Payer: Self-pay | Admitting: Hematology & Oncology

## 2017-09-22 ENCOUNTER — Encounter: Payer: Self-pay | Admitting: Family

## 2017-09-22 ENCOUNTER — Inpatient Hospital Stay (HOSPITAL_BASED_OUTPATIENT_CLINIC_OR_DEPARTMENT_OTHER): Payer: BLUE CROSS/BLUE SHIELD | Admitting: Family

## 2017-09-22 ENCOUNTER — Inpatient Hospital Stay: Payer: BLUE CROSS/BLUE SHIELD | Attending: Radiation Oncology

## 2017-09-22 ENCOUNTER — Other Ambulatory Visit: Payer: Self-pay

## 2017-09-22 ENCOUNTER — Inpatient Hospital Stay: Payer: BLUE CROSS/BLUE SHIELD

## 2017-09-22 VITALS — BP 106/76 | HR 80 | Temp 97.2°F | Resp 18 | Wt 155.5 lb

## 2017-09-22 DIAGNOSIS — C187 Malignant neoplasm of sigmoid colon: Secondary | ICD-10-CM

## 2017-09-22 DIAGNOSIS — C772 Secondary and unspecified malignant neoplasm of intra-abdominal lymph nodes: Principal | ICD-10-CM

## 2017-09-22 DIAGNOSIS — Z933 Colostomy status: Secondary | ICD-10-CM | POA: Insufficient documentation

## 2017-09-22 DIAGNOSIS — Z5111 Encounter for antineoplastic chemotherapy: Secondary | ICD-10-CM | POA: Insufficient documentation

## 2017-09-22 DIAGNOSIS — Z923 Personal history of irradiation: Secondary | ICD-10-CM | POA: Insufficient documentation

## 2017-09-22 DIAGNOSIS — Z9221 Personal history of antineoplastic chemotherapy: Secondary | ICD-10-CM | POA: Insufficient documentation

## 2017-09-22 DIAGNOSIS — C189 Malignant neoplasm of colon, unspecified: Secondary | ICD-10-CM | POA: Diagnosis not present

## 2017-09-22 LAB — CBC WITH DIFFERENTIAL (CANCER CENTER ONLY)
BASOS ABS: 0 10*3/uL (ref 0.0–0.1)
BASOS PCT: 0 %
Eosinophils Absolute: 0.1 10*3/uL (ref 0.0–0.5)
Eosinophils Relative: 3 %
HEMATOCRIT: 35.4 % (ref 34.8–46.6)
Hemoglobin: 11.5 g/dL — ABNORMAL LOW (ref 11.6–15.9)
LYMPHS PCT: 13 %
Lymphs Abs: 0.7 10*3/uL — ABNORMAL LOW (ref 0.9–3.3)
MCH: 28.8 pg (ref 26.0–34.0)
MCHC: 32.5 g/dL (ref 32.0–36.0)
MCV: 88.7 fL (ref 81.0–101.0)
MONO ABS: 0.5 10*3/uL (ref 0.1–0.9)
Monocytes Relative: 10 %
NEUTROS PCT: 74 %
Neutro Abs: 4 10*3/uL (ref 1.5–6.5)
Platelet Count: 248 10*3/uL (ref 145–400)
RBC: 3.99 MIL/uL (ref 3.70–5.32)
RDW: 15.2 % (ref 11.1–15.7)
WBC Count: 5.4 10*3/uL (ref 3.9–10.0)

## 2017-09-22 LAB — LACTATE DEHYDROGENASE: LDH: 182 U/L (ref 98–192)

## 2017-09-22 LAB — CMP (CANCER CENTER ONLY)
ALBUMIN: 3.1 g/dL — AB (ref 3.5–5.0)
ALK PHOS: 97 U/L — AB (ref 26–84)
ALT: 30 U/L (ref 10–47)
ANION GAP: 10 (ref 5–15)
AST: 24 U/L (ref 11–38)
BILIRUBIN TOTAL: 0.5 mg/dL (ref 0.2–1.6)
BUN: 12 mg/dL (ref 7–22)
CALCIUM: 9.2 mg/dL (ref 8.0–10.3)
CO2: 28 mmol/L (ref 18–33)
CREATININE: 0.7 mg/dL (ref 0.60–1.20)
Chloride: 105 mmol/L (ref 98–108)
GLUCOSE: 106 mg/dL (ref 73–118)
Potassium: 3.6 mmol/L (ref 3.3–4.7)
Sodium: 143 mmol/L (ref 128–145)
TOTAL PROTEIN: 7 g/dL (ref 6.4–8.1)

## 2017-09-22 LAB — CEA (IN HOUSE-CHCC): CEA (CHCC-IN HOUSE): 2.24 ng/mL (ref 0.00–5.00)

## 2017-09-22 MED ORDER — SODIUM CHLORIDE 0.9 % IJ SOLN
10.0000 mL | Freq: Once | INTRAMUSCULAR | Status: AC
  Administered 2017-09-22: 10 mL
  Filled 2017-09-22: qty 10

## 2017-09-22 MED ORDER — SODIUM CHLORIDE 0.9% FLUSH
3.0000 mL | INTRAVENOUS | Status: DC | PRN
  Filled 2017-09-22: qty 10

## 2017-09-22 MED ORDER — ATROPINE SULFATE 1 MG/ML IJ SOLN: 0.5000 mg | Freq: Once | INTRAMUSCULAR | Status: DC | PRN

## 2017-09-22 MED ORDER — PALONOSETRON HCL INJECTION 0.25 MG/5ML
INTRAVENOUS | Status: AC
  Filled 2017-09-22: qty 5

## 2017-09-22 MED ORDER — SODIUM CHLORIDE 0.9 % IV SOLN
2900.0000 mg/m2 | INTRAVENOUS | Status: DC
  Administered 2017-09-22: 5100 mg via INTRAVENOUS
  Filled 2017-09-22: qty 102

## 2017-09-22 MED ORDER — DEXAMETHASONE SODIUM PHOSPHATE 10 MG/ML IJ SOLN
INTRAMUSCULAR | Status: AC
  Filled 2017-09-22: qty 1

## 2017-09-22 MED ORDER — ALTEPLASE 2 MG IJ SOLR
2.0000 mg | Freq: Once | INTRAMUSCULAR | Status: DC | PRN
  Filled 2017-09-22: qty 2

## 2017-09-22 MED ORDER — HEPARIN SOD (PORK) LOCK FLUSH 100 UNIT/ML IV SOLN
250.0000 [IU] | Freq: Once | INTRAVENOUS | Status: DC | PRN
  Filled 2017-09-22: qty 5

## 2017-09-22 MED ORDER — DEXTROSE 5 % IV SOLN
Freq: Once | INTRAVENOUS | Status: AC
  Administered 2017-09-22: 09:00:00 via INTRAVENOUS

## 2017-09-22 MED ORDER — DEXTROSE 5 % IV SOLN
132.0000 mg/m2 | Freq: Once | INTRAVENOUS | Status: AC
Start: 2017-09-22 — End: 2017-09-22
  Administered 2017-09-22: 240 mg via INTRAVENOUS
  Filled 2017-09-22: qty 10

## 2017-09-22 MED ORDER — SODIUM CHLORIDE 0.9% FLUSH
10.0000 mL | INTRAVENOUS | Status: DC | PRN
  Filled 2017-09-22: qty 10

## 2017-09-22 MED ORDER — PALONOSETRON HCL INJECTION 0.25 MG/5ML
0.2500 mg | Freq: Once | INTRAVENOUS | Status: AC
  Administered 2017-09-22: 0.25 mg via INTRAVENOUS

## 2017-09-22 MED ORDER — DEXAMETHASONE SODIUM PHOSPHATE 10 MG/ML IJ SOLN
10.0000 mg | Freq: Once | INTRAMUSCULAR | Status: AC
  Administered 2017-09-22: 10 mg via INTRAVENOUS

## 2017-09-22 MED ORDER — DEXTROSE 5 % IV SOLN
200.0000 mg/m2 | Freq: Once | INTRAVENOUS | Status: AC
  Administered 2017-09-22: 350 mg via INTRAVENOUS
  Filled 2017-09-22: qty 17.5

## 2017-09-22 MED ORDER — HEPARIN SOD (PORK) LOCK FLUSH 100 UNIT/ML IV SOLN
500.0000 [IU] | Freq: Once | INTRAVENOUS | Status: DC | PRN
  Filled 2017-09-22: qty 5

## 2017-09-22 MED FILL — LYRICA 75 MG CAPSULE: 75 | 30 days supply | Qty: 60 | Fill #1

## 2017-09-22 NOTE — Patient Instructions (Signed)
Implanted Port Home Guide An implanted port is a type of central line that is placed under the skin. Central lines are used to provide IV access when treatment or nutrition needs to be given through a person's veins. Implanted ports are used for long-term IV access. An implanted port may be placed because:  You need IV medicine that would be irritating to the small veins in your hands or arms.  You need long-term IV medicines, such as antibiotics.  You need IV nutrition for a long period.  You need frequent blood draws for lab tests.  You need dialysis.  Implanted ports are usually placed in the chest area, but they can also be placed in the upper arm, the abdomen, or the leg. An implanted port has two main parts:  Reservoir. The reservoir is round and will appear as a small, raised area under your skin. The reservoir is the part where a needle is inserted to give medicines or draw blood.  Catheter. The catheter is a thin, flexible tube that extends from the reservoir. The catheter is placed into a large vein. Medicine that is inserted into the reservoir goes into the catheter and then into the vein.  How will I care for my incision site? Do not get the incision site wet. Bathe or shower as directed by your health care provider. How is my port accessed? Special steps must be taken to access the port:  Before the port is accessed, a numbing cream can be placed on the skin. This helps numb the skin over the port site.  Your health care provider uses a sterile technique to access the port. ? Your health care provider must put on a mask and sterile gloves. ? The skin over your port is cleaned carefully with an antiseptic and allowed to dry. ? The port is gently pinched between sterile gloves, and a needle is inserted into the port.  Only "non-coring" port needles should be used to access the port. Once the port is accessed, a blood return should be checked. This helps ensure that the port  is in the vein and is not clogged.  If your port needs to remain accessed for a constant infusion, a clear (transparent) bandage will be placed over the needle site. The bandage and needle will need to be changed every week, or as directed by your health care provider.  Keep the bandage covering the needle clean and dry. Do not get it wet. Follow your health care provider's instructions on how to take a shower or bath while the port is accessed.  If your port does not need to stay accessed, no bandage is needed over the port.  What is flushing? Flushing helps keep the port from getting clogged. Follow your health care provider's instructions on how and when to flush the port. Ports are usually flushed with saline solution or a medicine called heparin. The need for flushing will depend on how the port is used.  If the port is used for intermittent medicines or blood draws, the port will need to be flushed: ? After medicines have been given. ? After blood has been drawn. ? As part of routine maintenance.  If a constant infusion is running, the port may not need to be flushed.  How long will my port stay implanted? The port can stay in for as long as your health care provider thinks it is needed. When it is time for the port to come out, surgery will be   done to remove it. The procedure is similar to the one performed when the port was put in. When should I seek immediate medical care? When you have an implanted port, you should seek immediate medical care if:  You notice a bad smell coming from the incision site.  You have swelling, redness, or drainage at the incision site.  You have more swelling or pain at the port site or the surrounding area.  You have a fever that is not controlled with medicine.  This information is not intended to replace advice given to you by your health care provider. Make sure you discuss any questions you have with your health care provider. Document  Released: 03/11/2005 Document Revised: 08/17/2015 Document Reviewed: 11/16/2012 Elsevier Interactive Patient Education  2017 [employer]  

## 2017-09-22 NOTE — Progress Notes (Signed)
Hematology and Oncology Follow Up Visit  Carla Mills [id number] 09/11/1970 47 y.o. 09/22/2017   Principle Diagnosis:  Recurrent colon cancer - HER2 (-)/KRAS mutant/MSI stable/MMR normal/BRAF (wt) - inoperable  Past Therapy:             FOLFOXIRI - s/p cycle #12 -completed on 11/12/2016 Status post exploratory laparotomy with HIPEC - 07/29/2016  Current Therapy:   Radiation therapy with Xeloda - completed on 06/06/2017 FOLFOXIRI/Avastin - s/p cycle 5   Interim History:  Carla Mills is here today for follow-up. She is still having the rectal and abdominal spasms and pain. She is also still noting yellow discharge from the rectum at times. Unfortunately, Dr. Sondra Come did not feel that radiation would be appropriate so she has an appointment with Dr. Crisoforo Oxford to discuss surgery.  CEA is 2.24.  No fever, chills, n/v, cough, rash, dizziness, SOB, chest pain, palpitations or changes in bowel or bladder habits.  Her ostomy is functioning appropriately. Her fistula appears to have closed. The area is clean, dry and intact.  No episodes of bleeding, no bruising or petechiae.  No lymphadenopathy noted on her exam.  No swelling in her extremities. Neuropathy is unchanged.  She has maintained a good appetite and is staying well hydrated. Her weight is stable.    ECOG Performance Status: 1 - Symptomatic but completely ambulatory  Medications:  Allergies as of 09/22/2017      Reactions   Capecitabine Hives, Other (See Comments)   Legs and arms, after completing therapy   Bactrim [sulfamethoxazole-trimethoprim] Swelling, Other (See Comments)   Severe swelling   Oxaprozin Hives   Penicillins Hives, Other (See Comments)   Has patient had a PCN reaction causing immediate rash, facial/tongue/throat swelling, SOB or lightheadedness with hypotension: no Has patient had a PCN reaction causing severe rash involving mucus membranes or skin necrosis: {no Has patient had a PCN reaction that required  hospitalization no Has patient had a PCN reaction occurring within the last 10 years: no If all of the above answers are "NO", then may proceed with Cephalosporin use.   Sulfonamide Derivatives Swelling, Other (See Comments)   Massive extremity swelling       Medication List        Accurate as of 09/22/17  8:37 AM. Always use your most recent med list.          dexamethasone 4 MG tablet Commonly known as:  DECADRON Take 2 tablets (8 mg total) by mouth daily. Start the day after chemo for 2 days.   fluticasone 50 MCG/ACT nasal spray Commonly known as:  FLONASE Place 1 spray into both nostrils 2 (two) times daily.   LORazepam 1 MG tablet Commonly known as:  ATIVAN Take 1 tablet (1 mg total) by mouth every 6 (six) hours as needed (NAUSEA).   morphine 15 MG tablet Commonly known as:  MSIR Take 0.5 tablets (7.5 mg total) by mouth every 4 (four) hours as needed for severe pain.   morphine 30 MG 12 hr tablet Commonly known as:  MS CONTIN Take 1 tablet (30 mg total) by mouth every 12 (twelve) hours.   ondansetron 8 MG tablet Commonly known as:  ZOFRAN Take 1 tablet (8 mg total) by mouth 2 (two) times daily as needed for refractory nausea / vomiting. Start on day 3 after chemotherapy.   pregabalin 75 MG capsule Commonly known as:  LYRICA Take 1 capsule (75 mg total) by mouth 2 (two) times daily.   prochlorperazine 10 MG tablet  Commonly known as:  COMPAZINE Take 1 tablet (10 mg total) by mouth every 6 (six) hours as needed (NAUSEA).   traMADol 50 MG tablet Commonly known as:  ULTRAM Take 1 tablet (50 mg total) by mouth every 6 (six) hours as needed.       Allergies:  Allergies  Allergen Reactions  . Capecitabine Hives and Other (See Comments)    Legs and arms, after completing therapy  . Bactrim [Sulfamethoxazole-Trimethoprim] Swelling and Other (See Comments)    Severe swelling  . Oxaprozin Hives  . Penicillins Hives and Other (See Comments)    Has patient had a  PCN reaction causing immediate rash, facial/tongue/throat swelling, SOB or lightheadedness with hypotension: no Has patient had a PCN reaction causing severe rash involving mucus membranes or skin necrosis: {no Has patient had a PCN reaction that required hospitalization no Has patient had a PCN reaction occurring within the last 10 years: no If all of the above answers are "NO", then may proceed with Cephalosporin use.  . Sulfonamide Derivatives Swelling and Other (See Comments)    Massive extremity swelling     Past Medical History, Surgical history, Social history, and Family History were reviewed and updated.  Review of Systems: All other 10 point review of systems is negative.   Physical Exam:  weight is 155 lb 8 oz (70.5 kg). Her oral temperature is 97.2 F (36.2 C) (abnormal). Her blood pressure is 106/76 and her pulse is 80. Her respiration is 18 and oxygen saturation is 100%.   Wt Readings from Last 3 Encounters:  09/22/17 155 lb 8 oz (70.5 kg)  09/09/17 153 lb (69.4 kg)  08/27/17 152 lb 6.4 oz (69.1 kg)    Ocular: Sclerae unicteric, pupils equal, round and reactive to light Ear-nose-throat: Oropharynx clear, dentition fair Lymphatic: No cervical, supraclavicular or axillary adenopathy Lungs no rales or rhonchi, good excursion bilaterally Heart regular rate and rhythm, no murmur appreciated Abd soft, nontender, positive bowel sounds, ostomy intact and functioning appropriately, no liver or spleen tip palpated on exam, no fluid wave  MSK no focal spinal tenderness, no joint edema Neuro: non-focal, well-oriented, appropriate affect Breasts: Deferred   Lab Results  Component Value Date   WBC 5.4 09/22/2017   HGB 11.5 (L) 09/22/2017   HCT 35.4 09/22/2017   MCV 88.7 09/22/2017   PLT 248 09/22/2017   Lab Results  Component Value Date   FERRITIN 253 07/01/2017   IRON 52 07/01/2017   TIBC 266 07/01/2017   UIBC 214 07/01/2017   IRONPCTSAT 20 (L) 07/01/2017   Lab  Results  Component Value Date   RETICCTPCT 0.8 02/12/2015   RBC 3.99 09/22/2017   No results found for: KPAFRELGTCHN, LAMBDASER, KAPLAMBRATIO No results found for: IGGSERUM, IGA, IGMSERUM No results found for: Odetta Pink, SPEI   Chemistry      Component Value Date/Time   NA 143 09/09/2017 0755   NA 145 03/27/2017 1101   NA 141 07/30/2016 0750   K 3.9 09/09/2017 0755   K 4.5 03/27/2017 1101   K 4.2 07/30/2016 0750   CL 105 09/09/2017 0755   CL 103 03/27/2017 1101   CO2 28 09/09/2017 0755   CO2 28 03/27/2017 1101   CO2 26 07/30/2016 0750   BUN 12 09/09/2017 0755   BUN 12 03/27/2017 1101   BUN 12.1 07/30/2016 0750   CREATININE 0.90 09/09/2017 0755   CREATININE 0.9 03/27/2017 1101   CREATININE 0.7 07/30/2016 0750  Component Value Date/Time   CALCIUM 9.4 09/09/2017 0755   CALCIUM 9.6 03/27/2017 1101   CALCIUM 9.5 07/30/2016 0750   ALKPHOS 83 09/09/2017 0755   ALKPHOS 101 (H) 03/27/2017 1101   ALKPHOS 72 07/30/2016 0750   AST 23 09/09/2017 0755   AST 22 07/30/2016 0750   ALT 22 09/09/2017 0755   ALT 20 03/27/2017 1101   ALT 22 07/30/2016 0750   BILITOT 0.5 09/09/2017 0755   BILITOT 0.54 07/30/2016 0750      Impression and Plan: Carla Mills is a very pleasant 47 yo caucasian female with recurrent colon cancer, HER2 (-)/KRAS (+)/MSI stable/MMR normal/BRAF (-).  We will proceed with cycle 6 as planned.  She has an appointment with Dr. Crisoforo Oxford on 7/16 to discuss surgical options.  We will plan to see her back in another 2 weeks for follow-up and treatment.  She will contact our office with any questions or concerns. We can certainly see her sooner if need be.   Laverna Peace, NP 7/1/20198:37 AM

## 2017-09-22 NOTE — Patient Instructions (Signed)
[facility] Discharge Instructions for Patients Receiving Chemotherapy  Today you received the following chemotherapy agents 5 Fu/Leucovorin/Irinotecan To help prevent nausea and vomiting after your treatment, we encourage you to take your nausea medication as prescribed.   If you develop nausea and vomiting that is not controlled by your nausea medication, call the clinic.   BELOW ARE SYMPTOMS THAT SHOULD BE REPORTED IMMEDIATELY:  *FEVER GREATER THAN 100.5 F  *CHILLS WITH OR WITHOUT FEVER  NAUSEA AND VOMITING THAT IS NOT CONTROLLED WITH YOUR NAUSEA MEDICATION  *UNUSUAL SHORTNESS OF BREATH  *UNUSUAL BRUISING OR BLEEDING  TENDERNESS IN MOUTH AND THROAT WITH OR WITHOUT PRESENCE OF ULCERS  *URINARY PROBLEMS  *BOWEL PROBLEMS  UNUSUAL RASH Items with * indicate a potential emergency and should be followed up as soon as possible.  Feel free to call the clinic should you have any questions or concerns. The clinic phone number is [phone].  Please show the [facility] at check-in to the Emergency Department and triage nurse.

## 2017-09-22 NOTE — Progress Notes (Signed)
Infuse Leucovorin and Irinotecan simultaneously over 90 minutes per pharmacy.

## 2017-09-23 ENCOUNTER — Other Ambulatory Visit: Payer: BLUE CROSS/BLUE SHIELD

## 2017-09-23 ENCOUNTER — Ambulatory Visit: Payer: BLUE CROSS/BLUE SHIELD

## 2017-09-24 ENCOUNTER — Inpatient Hospital Stay: Payer: BLUE CROSS/BLUE SHIELD

## 2017-09-24 ENCOUNTER — Other Ambulatory Visit: Payer: Self-pay

## 2017-09-24 VITALS — BP 115/67 | HR 79 | Temp 97.4°F | Resp 18

## 2017-09-24 DIAGNOSIS — C189 Malignant neoplasm of colon, unspecified: Secondary | ICD-10-CM | POA: Diagnosis not present

## 2017-09-24 DIAGNOSIS — Z9221 Personal history of antineoplastic chemotherapy: Secondary | ICD-10-CM | POA: Diagnosis not present

## 2017-09-24 DIAGNOSIS — Z923 Personal history of irradiation: Secondary | ICD-10-CM | POA: Diagnosis not present

## 2017-09-24 DIAGNOSIS — Z933 Colostomy status: Secondary | ICD-10-CM | POA: Diagnosis not present

## 2017-09-24 DIAGNOSIS — Z5111 Encounter for antineoplastic chemotherapy: Secondary | ICD-10-CM | POA: Diagnosis not present

## 2017-09-24 DIAGNOSIS — C772 Secondary and unspecified malignant neoplasm of intra-abdominal lymph nodes: Principal | ICD-10-CM

## 2017-09-24 DIAGNOSIS — C187 Malignant neoplasm of sigmoid colon: Secondary | ICD-10-CM

## 2017-09-24 MED ORDER — HEPARIN SOD (PORK) LOCK FLUSH 100 UNIT/ML IV SOLN
500.0000 [IU] | Freq: Once | INTRAVENOUS | Status: AC | PRN
  Administered 2017-09-24: 500 [IU]
  Filled 2017-09-24: qty 5

## 2017-09-24 MED ORDER — SODIUM CHLORIDE 0.9% FLUSH
10.0000 mL | INTRAVENOUS | Status: DC | PRN
  Administered 2017-09-24: 10 mL
  Filled 2017-09-24: qty 10

## 2017-10-06 ENCOUNTER — Ambulatory Visit: Payer: BLUE CROSS/BLUE SHIELD

## 2017-10-06 ENCOUNTER — Other Ambulatory Visit: Payer: BLUE CROSS/BLUE SHIELD

## 2017-10-06 ENCOUNTER — Ambulatory Visit: Payer: BLUE CROSS/BLUE SHIELD | Admitting: Family

## 2017-10-07 DIAGNOSIS — C189 Malignant neoplasm of colon, unspecified: Secondary | ICD-10-CM | POA: Diagnosis not present

## 2017-10-08 ENCOUNTER — Ambulatory Visit: Payer: BLUE CROSS/BLUE SHIELD

## 2017-10-14 ENCOUNTER — Other Ambulatory Visit: Payer: Self-pay | Admitting: *Deleted

## 2017-10-14 DIAGNOSIS — C772 Secondary and unspecified malignant neoplasm of intra-abdominal lymph nodes: Principal | ICD-10-CM

## 2017-10-14 DIAGNOSIS — C189 Malignant neoplasm of colon, unspecified: Secondary | ICD-10-CM

## 2017-10-14 DIAGNOSIS — C187 Malignant neoplasm of sigmoid colon: Secondary | ICD-10-CM

## 2017-10-14 MED ORDER — MORPHINE SULFATE ER 30 MG PO TBCR: 30.0000 mg | EXTENDED_RELEASE_TABLET | Freq: Two times a day (BID) | ORAL | 0 refills | Status: DC

## 2017-10-14 MED ORDER — LORAZEPAM 1 MG PO TABS: 1.0000 mg | ORAL_TABLET | Freq: Four times a day (QID) | ORAL | 0 refills | Status: DC | PRN

## 2017-10-14 MED FILL — LORazepam 1 MG TABS: 1 | 7 days supply | Qty: 30 | Fill #0

## 2017-10-16 MED FILL — MORPHINE SULF ER 30 MG TAB: 30 | 30 days supply | Qty: 60 | Fill #0

## 2017-10-20 ENCOUNTER — Inpatient Hospital Stay: Payer: BLUE CROSS/BLUE SHIELD

## 2017-10-20 ENCOUNTER — Encounter: Payer: Self-pay | Admitting: Hematology & Oncology

## 2017-10-20 ENCOUNTER — Inpatient Hospital Stay (HOSPITAL_BASED_OUTPATIENT_CLINIC_OR_DEPARTMENT_OTHER): Payer: BLUE CROSS/BLUE SHIELD | Admitting: Hematology & Oncology

## 2017-10-20 ENCOUNTER — Other Ambulatory Visit: Payer: Self-pay

## 2017-10-20 VITALS — BP 108/63 | HR 77 | Temp 97.5°F | Resp 17 | Wt 160.0 lb

## 2017-10-20 DIAGNOSIS — C189 Malignant neoplasm of colon, unspecified: Secondary | ICD-10-CM

## 2017-10-20 DIAGNOSIS — Z923 Personal history of irradiation: Secondary | ICD-10-CM | POA: Diagnosis not present

## 2017-10-20 DIAGNOSIS — C187 Malignant neoplasm of sigmoid colon: Secondary | ICD-10-CM

## 2017-10-20 DIAGNOSIS — C772 Secondary and unspecified malignant neoplasm of intra-abdominal lymph nodes: Principal | ICD-10-CM

## 2017-10-20 DIAGNOSIS — Z9221 Personal history of antineoplastic chemotherapy: Secondary | ICD-10-CM

## 2017-10-20 DIAGNOSIS — Z933 Colostomy status: Secondary | ICD-10-CM | POA: Diagnosis not present

## 2017-10-20 DIAGNOSIS — Z5111 Encounter for antineoplastic chemotherapy: Secondary | ICD-10-CM | POA: Diagnosis not present

## 2017-10-20 LAB — CBC WITH DIFFERENTIAL (CANCER CENTER ONLY)
Basophils Absolute: 0 10*3/uL (ref 0.0–0.1)
Basophils Relative: 0 %
EOS PCT: 3 %
Eosinophils Absolute: 0.2 10*3/uL (ref 0.0–0.5)
HCT: 38.7 % (ref 34.8–46.6)
Hemoglobin: 12.6 g/dL (ref 11.6–15.9)
LYMPHS ABS: 0.9 10*3/uL (ref 0.9–3.3)
Lymphocytes Relative: 13 %
MCH: 29.2 pg (ref 26.0–34.0)
MCHC: 32.6 g/dL (ref 32.0–36.0)
MCV: 89.6 fL (ref 81.0–101.0)
MONO ABS: 0.7 10*3/uL (ref 0.1–0.9)
Monocytes Relative: 10 %
Neutro Abs: 5.5 10*3/uL (ref 1.5–6.5)
Neutrophils Relative %: 74 %
PLATELETS: 288 10*3/uL (ref 145–400)
RBC: 4.32 MIL/uL (ref 3.70–5.32)
RDW: 14.8 % (ref 11.1–15.7)
WBC Count: 7.4 10*3/uL (ref 3.9–10.0)

## 2017-10-20 LAB — CMP (CANCER CENTER ONLY)
ALT: 19 U/L (ref 10–47)
AST: 24 U/L (ref 11–38)
Albumin: 3.4 g/dL — ABNORMAL LOW (ref 3.5–5.0)
Alkaline Phosphatase: 85 U/L — ABNORMAL HIGH (ref 26–84)
Anion gap: 5 (ref 5–15)
BUN: 14 mg/dL (ref 7–22)
CHLORIDE: 108 mmol/L (ref 98–108)
CO2: 28 mmol/L (ref 18–33)
Calcium: 9.6 mg/dL (ref 8.0–10.3)
Creatinine: 0.7 mg/dL (ref 0.60–1.20)
GLUCOSE: 87 mg/dL (ref 73–118)
Potassium: 4.1 mmol/L (ref 3.3–4.7)
Sodium: 141 mmol/L (ref 128–145)
Total Bilirubin: 0.6 mg/dL (ref 0.2–1.6)
Total Protein: 7 g/dL (ref 6.4–8.1)

## 2017-10-20 LAB — CEA (IN HOUSE-CHCC): CEA (CHCC-In House): 1.77 ng/mL (ref 0.00–5.00)

## 2017-10-20 LAB — LACTATE DEHYDROGENASE: LDH: 203 U/L — ABNORMAL HIGH (ref 98–192)

## 2017-10-20 MED ORDER — DEXTROSE 5 % IV SOLN
Freq: Once | INTRAVENOUS | Status: DC
  Filled 2017-10-20: qty 250

## 2017-10-20 MED ORDER — LEUCOVORIN CALCIUM INJECTION 350 MG
200.0000 mg/m2 | Freq: Once | INTRAVENOUS | Status: AC
  Administered 2017-10-20: 350 mg via INTRAVENOUS
  Filled 2017-10-20: qty 17.5

## 2017-10-20 MED ORDER — PALONOSETRON HCL INJECTION 0.25 MG/5ML
INTRAVENOUS | Status: AC
  Filled 2017-10-20: qty 5

## 2017-10-20 MED ORDER — PALONOSETRON HCL INJECTION 0.25 MG/5ML
0.2500 mg | Freq: Once | INTRAVENOUS | Status: AC
  Administered 2017-10-20: 0.25 mg via INTRAVENOUS

## 2017-10-20 MED ORDER — DEXAMETHASONE SODIUM PHOSPHATE 10 MG/ML IJ SOLN
INTRAMUSCULAR | Status: AC
  Filled 2017-10-20: qty 1

## 2017-10-20 MED ORDER — SODIUM CHLORIDE 0.9 % IV SOLN
2900.0000 mg/m2 | INTRAVENOUS | Status: DC
  Administered 2017-10-20: 5100 mg via INTRAVENOUS
  Filled 2017-10-20: qty 102

## 2017-10-20 MED ORDER — SODIUM CHLORIDE 0.9 % IV SOLN
INTRAVENOUS | Status: AC
  Administered 2017-10-20: 10:00:00 via INTRAVENOUS
  Filled 2017-10-20: qty 250

## 2017-10-20 MED ORDER — IRINOTECAN HCL CHEMO INJECTION 100 MG/5ML
132.0000 mg/m2 | Freq: Once | INTRAVENOUS | Status: AC
  Administered 2017-10-20: 240 mg via INTRAVENOUS
  Filled 2017-10-20: qty 10

## 2017-10-20 MED ORDER — DEXAMETHASONE SODIUM PHOSPHATE 10 MG/ML IJ SOLN
10.0000 mg | Freq: Once | INTRAMUSCULAR | Status: AC
  Administered 2017-10-20: 10 mg via INTRAVENOUS

## 2017-10-20 NOTE — Progress Notes (Signed)
Hematology and Oncology Follow Up Visit  Carla Mills [id number] 07-28-1970 47 y.o. 10/20/2017   Principle Diagnosis:  Recurrent colon cancer - HER2 (-)/KRAS mutant/MSI stable/MMR normal/BRAF (wt) - inoperable  Past Therapy:   FOLFOXIRI - s/p cycle #12 - completed on 11/12/2016 Status post exploratory laparotomy with HIPEC - 07/29/2016  Current Therapy: Radiation therapy with Xeloda - completed on 06/06/2017 FOLFOXIRI/Avastin - s/p cycle #6 (Avastin on hold)   Interim History:  [name] is back for follow-up.  She is not sure when surgery is going to be done.  She does have an MRI.  This will be done at [facility]..  She has seen Dr.Perry Crisoforo Oxford.  He is her surgical oncologist.  He would like to have the MRI done before he decides about surgery.  However, from what [name] says, it sounds like he is fairly optimistic that he can resect out her relapsed disease and help with her pain.  The MRI will be set up for August 7.  Hopefully, she will have surgery later in August.  As always, her CEA has been relatively normal.  There is been no problems with nausea or vomiting.  She has had no bleeding.  She has had some rectal discharge which she has had for a while since her last surgery.  Her pain is under better control with morphine.  Overall, her performance status is ECOG 1.    Medications:  Allergies as of 10/20/2017      Reactions   Capecitabine Hives, Other (See Comments)   Legs and arms, after completing therapy   Bactrim [sulfamethoxazole-trimethoprim] Swelling, Other (See Comments)   Severe swelling   Oxaprozin Hives   Penicillins Hives, Other (See Comments)   Has patient had a PCN reaction causing immediate rash, facial/tongue/throat swelling, SOB or lightheadedness with hypotension: no Has patient had a PCN reaction causing severe rash involving mucus membranes or skin necrosis: {no Has patient had a PCN reaction that required hospitalization no Has patient had a  PCN reaction occurring within the last 10 years: no If all of the above answers are "NO", then may proceed with Cephalosporin use.   Sulfonamide Derivatives Swelling, Other (See Comments)   Massive extremity swelling       Medication List        Accurate as of 10/20/17  9:25 AM. Always use your most recent med list.          dexamethasone 4 MG tablet Commonly known as:  DECADRON Take 2 tablets (8 mg total) by mouth daily. Start the day after chemo for 2 days.   fluticasone 50 MCG/ACT nasal spray Commonly known as:  FLONASE Place 1 spray into both nostrils 2 (two) times daily.   LORazepam 1 MG tablet Commonly known as:  ATIVAN Take 1 tablet (1 mg total) by mouth every 6 (six) hours as needed (NAUSEA).   morphine 15 MG tablet Commonly known as:  MSIR Take 0.5 tablets (7.5 mg total) by mouth every 4 (four) hours as needed for severe pain.   morphine 30 MG 12 hr tablet Commonly known as:  MS CONTIN Take 1 tablet (30 mg total) by mouth every 12 (twelve) hours.   ondansetron 8 MG tablet Commonly known as:  ZOFRAN Take 1 tablet (8 mg total) by mouth 2 (two) times daily as needed for refractory nausea / vomiting. Start on day 3 after chemotherapy.   pregabalin 75 MG capsule Commonly known as:  LYRICA Take 1 capsule (75 mg total) by mouth  2 (two) times daily.   prochlorperazine 10 MG tablet Commonly known as:  COMPAZINE Take 1 tablet (10 mg total) by mouth every 6 (six) hours as needed (NAUSEA).   traMADol 50 MG tablet Commonly known as:  ULTRAM Take 1 tablet (50 mg total) by mouth every 6 (six) hours as needed.       Allergies:  Allergies  Allergen Reactions  . Capecitabine Hives and Other (See Comments)    Legs and arms, after completing therapy  . Bactrim [Sulfamethoxazole-Trimethoprim] Swelling and Other (See Comments)    Severe swelling  . Oxaprozin Hives  . Penicillins Hives and Other (See Comments)    Has patient had a PCN reaction causing immediate rash,  facial/tongue/throat swelling, SOB or lightheadedness with hypotension: no Has patient had a PCN reaction causing severe rash involving mucus membranes or skin necrosis: {no Has patient had a PCN reaction that required hospitalization no Has patient had a PCN reaction occurring within the last 10 years: no If all of the above answers are "NO", then may proceed with Cephalosporin use.  . Sulfonamide Derivatives Swelling and Other (See Comments)    Massive extremity swelling     Past Medical History, Surgical history, Social history, and Family History were reviewed and updated.  Review of Systems: Review of Systems  Constitutional: Negative.   HENT: Negative.   Eyes: Negative.   Respiratory: Negative.   Cardiovascular: Negative.   Gastrointestinal: Negative.   Genitourinary: Negative.   Musculoskeletal: Negative.   Skin: Negative.   Neurological: Negative.   Endo/Heme/Allergies: Negative.   Psychiatric/Behavioral: Negative.   All other systems reviewed and are negative.   Physical Exam:  weight is 160 lb (72.6 kg). Her oral temperature is 97.5 F (36.4 C) (abnormal). Her blood pressure is 108/63 and her pulse is 77. Her respiration is 17 and oxygen saturation is 100%.   Wt Readings from Last 3 Encounters:  10/20/17 160 lb (72.6 kg)  09/22/17 155 lb 8 oz (70.5 kg)  09/09/17 153 lb (69.4 kg)    Physical Exam  Constitutional: She is oriented to person, place, and time.  HENT:  Head: Normocephalic and atraumatic.  Mouth/Throat: Oropharynx is clear and moist.  Eyes: Pupils are equal, round, and reactive to light. EOM are normal.  Neck: Normal range of motion.  Cardiovascular: Normal rate, regular rhythm and normal heart sounds.  Pulmonary/Chest: Effort normal and breath sounds normal.  Abdominal: Soft. Bowel sounds are normal.  She has a colostomy in the lower abdomen.  She has the fistula which is about 3 mm in size at the caudal end of her laparotomy scar.  The laparotomy  scar is well  Musculoskeletal: Normal range of motion. She exhibits no edema, tenderness or deformity.  Lymphadenopathy:    She has no cervical adenopathy.  Neurological: She is alert and oriented to person, place, and time.  Skin: Skin is warm and dry. No rash noted. No erythema.  Psychiatric: She has a normal mood and affect. Her behavior is normal. Judgment and thought content normal.  Vitals reviewed.    Lab Results  Component Value Date   WBC 7.4 10/20/2017   HGB 12.6 10/20/2017   HCT 38.7 10/20/2017   MCV 89.6 10/20/2017   PLT 288 10/20/2017   Lab Results  Component Value Date   FERRITIN 253 07/01/2017   IRON 52 07/01/2017   TIBC 266 07/01/2017   UIBC 214 07/01/2017   IRONPCTSAT 20 (L) 07/01/2017   Lab Results  Component Value Date  RETICCTPCT 0.8 02/12/2015   RBC 4.32 10/20/2017   No results found for: KPAFRELGTCHN, LAMBDASER, KAPLAMBRATIO No results found for: IGGSERUM, IGA, IGMSERUM No results found for: Odetta Pink, SPEI   Chemistry      Component Value Date/Time   NA 141 10/20/2017 0825   NA 145 03/27/2017 1101   NA 141 07/30/2016 0750   K 4.1 10/20/2017 0825   K 4.5 03/27/2017 1101   K 4.2 07/30/2016 0750   CL 108 10/20/2017 0825   CL 103 03/27/2017 1101   CO2 28 10/20/2017 0825   CO2 28 03/27/2017 1101   CO2 26 07/30/2016 0750   BUN 14 10/20/2017 0825   BUN 12 03/27/2017 1101   BUN 12.1 07/30/2016 0750   CREATININE 0.70 10/20/2017 0825   CREATININE 0.9 03/27/2017 1101   CREATININE 0.7 07/30/2016 0750      Component Value Date/Time   CALCIUM 9.6 10/20/2017 0825   CALCIUM 9.6 03/27/2017 1101   CALCIUM 9.5 07/30/2016 0750   ALKPHOS 85 (H) 10/20/2017 0825   ALKPHOS 101 (H) 03/27/2017 1101   ALKPHOS 72 07/30/2016 0750   AST 24 10/20/2017 0825   AST 22 07/30/2016 0750   ALT 19 10/20/2017 0825   ALT 20 03/27/2017 1101   ALT 22 07/30/2016 0750   BILITOT 0.6 10/20/2017 0825   BILITOT 0.54  07/30/2016 0750      Impression and Plan: Ms. Keeling is a very pleasant 47 yo caucasian female with recurrent colon cancer - HER2 (-)/KRAS (+)/MSI stable/MMR normal/BRAF -.   We will still proceed with her seventh cycle of treatment.    I am holding the Avastin.  Again, it sounds like she we will have surgery in August or early September.  I will see her back in 2 weeks.  At that time, depending on when her surgery will be, we will or not be able to give her chemotherapy.  Volanda Napoleon, MD 7/29/20199:25 AM

## 2017-10-20 NOTE — Patient Instructions (Signed)
Implanted Port Insertion, Care After °This sheet gives you information about how to care for yourself after your procedure. Your health care provider may also give you more specific instructions. If you have problems or questions, contact your health care provider. °What can I expect after the procedure? °After your procedure, it is common to have: °· Discomfort at the port insertion site. °· Bruising on the skin over the port. This should improve over 3-4 days. ° °Follow these instructions at home: °Port care °· After your port is placed, you will get a manufacturer's information card. The card has information about your port. Keep this card with you at all times. °· Take care of the port as told by your health care provider. Ask your health care provider if you or a family member can get training for taking care of the port at home. A home health care nurse may also take care of the port. °· Make sure to remember what type of port you have. °Incision care °· Follow instructions from your health care provider about how to take care of your port insertion site. Make sure you: °? Wash your hands with soap and water before you change your bandage (dressing). If soap and water are not available, use hand sanitizer. °? Change your dressing as told by your health care provider. °? Leave stitches (sutures), skin glue, or adhesive strips in place. These skin closures may need to stay in place for 2 weeks or longer. If adhesive strip edges start to loosen and curl up, you may trim the loose edges. Do not remove adhesive strips completely unless your health care provider tells you to do that. °· Check your port insertion site every day for signs of infection. Check for: °? More redness, swelling, or pain. °? More fluid or blood. °? Warmth. °? Pus or a bad smell. °General instructions °· Do not take baths, swim, or use a hot tub until your health care provider approves. °· Do not lift anything that is heavier than 10 lb (4.5  kg) for a week, or as told by your health care provider. °· Ask your health care provider when it is okay to: °? Return to work or school. °? Resume usual physical activities or sports. °· Do not drive for 24 hours if you were given a medicine to help you relax (sedative). °· Take over-the-counter and prescription medicines only as told by your health care provider. °· Wear a medical alert bracelet in case of an emergency. This will tell any health care providers that you have a port. °· Keep all follow-up visits as told by your health care provider. This is important. °Contact a health care provider if: °· You cannot flush your port with saline as directed, or you cannot draw blood from the port. °· You have a fever or chills. °· You have more redness, swelling, or pain around your port insertion site. °· You have more fluid or blood coming from your port insertion site. °· Your port insertion site feels warm to the touch. °· You have pus or a bad smell coming from the port insertion site. °Get help right away if: °· You have chest pain or shortness of breath. °· You have bleeding from your port that you cannot control. °Summary °· Take care of the port as told by your health care provider. °· Change your dressing as told by your health care provider. °· Keep all follow-up visits as told by your health care provider. °  This information is not intended to replace advice given to you by your health care provider. Make sure you discuss any questions you have with your health care provider. °Document Released: 12/30/2012 Document Revised: 01/31/2016 Document Reviewed: 01/31/2016 °Elsevier Interactive Patient Education © 2017 [employer] ° °

## 2017-10-22 ENCOUNTER — Other Ambulatory Visit: Payer: Self-pay

## 2017-10-22 ENCOUNTER — Inpatient Hospital Stay: Payer: BLUE CROSS/BLUE SHIELD

## 2017-10-22 VITALS — BP 112/62 | HR 65 | Temp 97.8°F | Resp 16

## 2017-10-22 DIAGNOSIS — Z9221 Personal history of antineoplastic chemotherapy: Secondary | ICD-10-CM | POA: Diagnosis not present

## 2017-10-22 DIAGNOSIS — C189 Malignant neoplasm of colon, unspecified: Secondary | ICD-10-CM

## 2017-10-22 DIAGNOSIS — Z933 Colostomy status: Secondary | ICD-10-CM | POA: Diagnosis not present

## 2017-10-22 DIAGNOSIS — Z923 Personal history of irradiation: Secondary | ICD-10-CM | POA: Diagnosis not present

## 2017-10-22 DIAGNOSIS — C187 Malignant neoplasm of sigmoid colon: Secondary | ICD-10-CM

## 2017-10-22 DIAGNOSIS — C772 Secondary and unspecified malignant neoplasm of intra-abdominal lymph nodes: Principal | ICD-10-CM

## 2017-10-22 DIAGNOSIS — Z5111 Encounter for antineoplastic chemotherapy: Secondary | ICD-10-CM | POA: Diagnosis not present

## 2017-10-22 MED ORDER — SODIUM CHLORIDE 0.9% FLUSH
10.0000 mL | INTRAVENOUS | Status: DC | PRN
  Administered 2017-10-22: 10 mL
  Filled 2017-10-22: qty 10

## 2017-10-22 MED ORDER — HEPARIN SOD (PORK) LOCK FLUSH 100 UNIT/ML IV SOLN
500.0000 [IU] | Freq: Once | INTRAVENOUS | Status: AC | PRN
  Administered 2017-10-22: 500 [IU]
  Filled 2017-10-22: qty 5

## 2017-10-22 MED FILL — LYRICA 75 MG CAPSULE: 75 | 30 days supply | Qty: 60 | Fill #2

## 2017-10-22 NOTE — Patient Instructions (Signed)
Fluorouracil, 5-FU injection What is this medicine? FLUOROURACIL, 5-FU (flure oh YOOR a sil) is a chemotherapy drug. It slows the growth of cancer cells. This medicine is used to treat many types of cancer like breast cancer, colon or rectal cancer, pancreatic cancer, and stomach cancer. This medicine may be used for other purposes; ask your health care provider or pharmacist if you have questions. COMMON BRAND NAME(S): Adrucil What should I tell my health care provider before I take this medicine? They need to know if you have any of these conditions: -blood disorders -dihydropyrimidine dehydrogenase (DPD) deficiency -infection (especially a virus infection such as chickenpox, cold sores, or herpes) -kidney disease -liver disease -malnourished, poor nutrition -recent or ongoing radiation therapy -an unusual or allergic reaction to fluorouracil, other chemotherapy, other medicines, foods, dyes, or preservatives -pregnant or trying to get pregnant -breast-feeding How should I use this medicine? This drug is given as an infusion or injection into a vein. It is administered in a hospital or clinic by a specially trained health care professional. Talk to your pediatrician regarding the use of this medicine in children. Special care may be needed. Overdosage: If you think you have taken too much of this medicine contact a poison control center or emergency room at once. NOTE: This medicine is only for you. Do not share this medicine with others. What if I miss a dose? It is important not to miss your dose. Call your doctor or health care professional if you are unable to keep an appointment. What may interact with this medicine? -allopurinol -cimetidine -dapsone -digoxin -hydroxyurea -leucovorin -levamisole -medicines for seizures like ethotoin, fosphenytoin, phenytoin -medicines to increase blood counts like filgrastim, pegfilgrastim, sargramostim -medicines that treat or prevent blood  clots like warfarin, enoxaparin, and dalteparin -methotrexate -metronidazole -pyrimethamine -some other chemotherapy drugs like busulfan, cisplatin, estramustine, vinblastine -trimethoprim -trimetrexate -vaccines Talk to your doctor or health care professional before taking any of these medicines: -acetaminophen -aspirin -ibuprofen -ketoprofen -naproxen This list may not describe all possible interactions. Give your health care provider a list of all the medicines, herbs, non-prescription drugs, or dietary supplements you use. Also tell them if you smoke, drink alcohol, or use illegal drugs. Some items may interact with your medicine. What should I watch for while using this medicine? Visit your doctor for checks on your progress. This drug may make you feel generally unwell. This is not uncommon, as chemotherapy can affect healthy cells as well as cancer cells. Report any side effects. Continue your course of treatment even though you feel ill unless your doctor tells you to stop. In some cases, you may be given additional medicines to help with side effects. Follow all directions for their use. Call your doctor or health care professional for advice if you get a fever, chills or sore throat, or other symptoms of a cold or flu. Do not treat yourself. This drug decreases your body's ability to fight infections. Try to avoid being around people who are sick. This medicine may increase your risk to bruise or bleed. Call your doctor or health care professional if you notice any unusual bleeding. Be careful brushing and flossing your teeth or using a toothpick because you may get an infection or bleed more easily. If you have any dental work done, tell your dentist you are receiving this medicine. Avoid taking products that contain aspirin, acetaminophen, ibuprofen, naproxen, or ketoprofen unless instructed by your doctor. These medicines may hide a fever. Do not become pregnant while taking this    medicine. Women should inform their doctor if they wish to become pregnant or think they might be pregnant. There is a potential for serious side effects to an unborn child. Talk to your health care professional or pharmacist for more information. Do not breast-feed an infant while taking this medicine. Men should inform their doctor if they wish to father a child. This medicine may lower sperm counts. Do not treat diarrhea with over the counter products. Contact your doctor if you have diarrhea that lasts more than 2 days or if it is severe and watery. This medicine can make you more sensitive to the sun. Keep out of the sun. If you cannot avoid being in the sun, wear protective clothing and use sunscreen. Do not use sun lamps or tanning beds/booths. What side effects may I notice from receiving this medicine? Side effects that you should report to your doctor or health care professional as soon as possible: -allergic reactions like skin rash, itching or hives, swelling of the face, lips, or tongue -low blood counts - this medicine may decrease the number of white blood cells, red blood cells and platelets. You may be at increased risk for infections and bleeding. -signs of infection - fever or chills, cough, sore throat, pain or difficulty passing urine -signs of decreased platelets or bleeding - bruising, pinpoint red spots on the skin, black, tarry stools, blood in the urine -signs of decreased red blood cells - unusually weak or tired, fainting spells, lightheadedness -breathing problems -changes in vision -chest pain -mouth sores -nausea and vomiting -pain, swelling, redness at site where injected -pain, tingling, numbness in the hands or feet -redness, swelling, or sores on hands or feet -stomach pain -unusual bleeding Side effects that usually do not require medical attention (report to your doctor or health care professional if they continue or are bothersome): -changes in finger or  toe nails -diarrhea -dry or itchy skin -hair loss -headache -loss of appetite -sensitivity of eyes to the light -stomach upset -unusually teary eyes This list may not describe all possible side effects. Call your doctor for medical advice about side effects. You may report side effects to FDA at 1-800-FDA-1088. Where should I keep my medicine? This drug is given in a hospital or clinic and will not be stored at home. NOTE: This sheet is a summary. It may not cover all possible information. If you have questions about this medicine, talk to your doctor, pharmacist, or health care provider.  2018 Elsevier/Gold Standard (2007-07-15 13:53:16)  

## 2017-11-03 ENCOUNTER — Inpatient Hospital Stay: Payer: BLUE CROSS/BLUE SHIELD | Attending: Radiation Oncology | Admitting: Hematology & Oncology

## 2017-11-03 ENCOUNTER — Encounter: Payer: Self-pay | Admitting: Hematology & Oncology

## 2017-11-03 ENCOUNTER — Other Ambulatory Visit: Payer: Self-pay

## 2017-11-03 ENCOUNTER — Inpatient Hospital Stay: Payer: BLUE CROSS/BLUE SHIELD

## 2017-11-03 VITALS — BP 111/69 | HR 72 | Temp 97.6°F | Resp 18 | Wt 164.0 lb

## 2017-11-03 DIAGNOSIS — Z5111 Encounter for antineoplastic chemotherapy: Secondary | ICD-10-CM | POA: Diagnosis not present

## 2017-11-03 DIAGNOSIS — C189 Malignant neoplasm of colon, unspecified: Secondary | ICD-10-CM

## 2017-11-03 DIAGNOSIS — C772 Secondary and unspecified malignant neoplasm of intra-abdominal lymph nodes: Principal | ICD-10-CM

## 2017-11-03 DIAGNOSIS — Z923 Personal history of irradiation: Secondary | ICD-10-CM

## 2017-11-03 DIAGNOSIS — Z9221 Personal history of antineoplastic chemotherapy: Secondary | ICD-10-CM | POA: Diagnosis not present

## 2017-11-03 DIAGNOSIS — C187 Malignant neoplasm of sigmoid colon: Secondary | ICD-10-CM

## 2017-11-03 LAB — CBC WITH DIFFERENTIAL (CANCER CENTER ONLY)
BASOS ABS: 0 10*3/uL (ref 0.0–0.1)
BASOS PCT: 0 %
EOS ABS: 0.2 10*3/uL (ref 0.0–0.5)
EOS PCT: 4 %
HCT: 37.2 % (ref 34.8–46.6)
Hemoglobin: 12 g/dL (ref 11.6–15.9)
LYMPHS PCT: 11 %
Lymphs Abs: 0.6 10*3/uL — ABNORMAL LOW (ref 0.9–3.3)
MCH: 29.3 pg (ref 26.0–34.0)
MCHC: 32.3 g/dL (ref 32.0–36.0)
MCV: 91 fL (ref 81.0–101.0)
MONO ABS: 0.5 10*3/uL (ref 0.1–0.9)
Monocytes Relative: 10 %
Neutro Abs: 3.7 10*3/uL (ref 1.5–6.5)
Neutrophils Relative %: 75 %
PLATELETS: 249 10*3/uL (ref 145–400)
RBC: 4.09 MIL/uL (ref 3.70–5.32)
RDW: 14.2 % (ref 11.1–15.7)
WBC: 5 10*3/uL (ref 3.9–10.0)

## 2017-11-03 LAB — CMP (CANCER CENTER ONLY)
ALBUMIN: 3.3 g/dL — AB (ref 3.5–5.0)
ALK PHOS: 91 U/L — AB (ref 26–84)
ALT: 32 U/L (ref 10–47)
AST: 29 U/L (ref 11–38)
Anion gap: 5 (ref 5–15)
BUN: 13 mg/dL (ref 7–22)
CHLORIDE: 104 mmol/L (ref 98–108)
CO2: 29 mmol/L (ref 18–33)
CREATININE: 0.9 mg/dL (ref 0.60–1.20)
Calcium: 9.3 mg/dL (ref 8.0–10.3)
GLUCOSE: 95 mg/dL (ref 73–118)
POTASSIUM: 4.1 mmol/L (ref 3.3–4.7)
SODIUM: 138 mmol/L (ref 128–145)
Total Bilirubin: 0.7 mg/dL (ref 0.2–1.6)
Total Protein: 6.9 g/dL (ref 6.4–8.1)

## 2017-11-03 LAB — LACTATE DEHYDROGENASE: LDH: 189 U/L (ref 98–192)

## 2017-11-03 LAB — CEA (IN HOUSE-CHCC): CEA (CHCC-IN HOUSE): 2.4 ng/mL (ref 0.00–5.00)

## 2017-11-03 MED ORDER — SODIUM CHLORIDE 0.9 % IV SOLN
INTRAVENOUS | Status: DC
Start: 2017-11-03 — End: 2017-11-03
  Administered 2017-11-03: 11:00:00 via INTRAVENOUS
  Filled 2017-11-03: qty 250

## 2017-11-03 MED ORDER — PALONOSETRON HCL INJECTION 0.25 MG/5ML
INTRAVENOUS | Status: AC
  Filled 2017-11-03: qty 5

## 2017-11-03 MED ORDER — PALONOSETRON HCL INJECTION 0.25 MG/5ML
0.2500 mg | Freq: Once | INTRAVENOUS | Status: AC
  Administered 2017-11-03: 0.25 mg via INTRAVENOUS

## 2017-11-03 MED ORDER — ATROPINE SULFATE 1 MG/ML IJ SOLN: 0.5000 mg | Freq: Once | INTRAMUSCULAR | Status: DC | PRN

## 2017-11-03 MED ORDER — DEXTROSE 5 % IV SOLN
132.0000 mg/m2 | Freq: Once | INTRAVENOUS | Status: AC
  Administered 2017-11-03: 240 mg via INTRAVENOUS
  Filled 2017-11-03: qty 10

## 2017-11-03 MED ORDER — DEXAMETHASONE SODIUM PHOSPHATE 10 MG/ML IJ SOLN
INTRAMUSCULAR | Status: AC
  Filled 2017-11-03: qty 1

## 2017-11-03 MED ORDER — LEUCOVORIN CALCIUM INJECTION 350 MG
200.0000 mg/m2 | Freq: Once | INTRAMUSCULAR | Status: AC
  Administered 2017-11-03: 350 mg via INTRAVENOUS
  Filled 2017-11-03: qty 17.5

## 2017-11-03 MED ORDER — SODIUM CHLORIDE 0.9 % IV SOLN
INTRAVENOUS | Status: DC
  Administered 2017-11-03: 11:00:00 via INTRAVENOUS
  Filled 2017-11-03: qty 250

## 2017-11-03 MED ORDER — FLUOROURACIL CHEMO INJECTION 5 GM/100ML
2900.0000 mg/m2 | INTRAVENOUS | Status: DC
  Administered 2017-11-03: 5100 mg via INTRAVENOUS
  Filled 2017-11-03: qty 102

## 2017-11-03 MED ORDER — DEXAMETHASONE SODIUM PHOSPHATE 10 MG/ML IJ SOLN
10.0000 mg | Freq: Once | INTRAMUSCULAR | Status: AC
  Administered 2017-11-03: 10 mg via INTRAVENOUS

## 2017-11-03 NOTE — Progress Notes (Signed)
Hematology and Oncology Follow Up Visit  Carla Mills [id number] [birth year] 47 y.o. 11/03/2017   Principle Diagnosis:  Recurrent colon cancer - HER2 (-)/KRAS mutant/MSI stable/MMR normal/BRAF (wt) - inoperable  Past Therapy:   FOLFOXIRI - s/p cycle #12 - completed on 11/12/2016 Status post exploratory laparotomy with HIPEC - 07/29/2016  Current Therapy: Radiation therapy with Xeloda - completed on 06/06/2017 FOLFOXIRI/Avastin - s/p cycle #7 (Avastin on hold)   Interim History:  Carla Mills is back for follow-up.  Unfortunately, we still do not know when her surgery is going to be.  There apparently was a mixup regarding her MRI.  She was told that she cannot have it done under anesthesia at [facility].  I think this is absurd.  I am sure that the do it under anesthesia.  I think that it was all sorted out.  She is going to have it done, tentatively, on Friday, August 16.  She really looks great.  She is not having nearly as much pain.  The Lyrica I think has helped quite a bit.  She and her husband played 9 holes of golf last Wednesday.  I am in all of this.  Just by looking at her, you would have her know that she had a problem.  She is tolerated chemotherapy incredibly well.  We dropped the oxaliplatin.  She has not lost her hair despite having irinotecan on board.  Her last CEA in July was 1.77.  Her appetite is good.  There is no nausea or vomiting.  Her colostomy is working quite nicely.  She has had no leg swelling.  She has had no rashes..  She still has some rectal discharge.  Overall, her performance status is ECOG 1.    Medications:  Allergies as of 11/03/2017      Reactions   Capecitabine Hives, Other (See Comments)   Legs and arms, after completing therapy   Bactrim [sulfamethoxazole-trimethoprim] Swelling, Other (See Comments)   Severe swelling   Oxaprozin Hives   Penicillins Hives, Other (See Comments)   Has patient had a PCN reaction causing immediate rash,  facial/tongue/throat swelling, SOB or lightheadedness with hypotension: no Has patient had a PCN reaction causing severe rash involving mucus membranes or skin necrosis: {no Has patient had a PCN reaction that required hospitalization no Has patient had a PCN reaction occurring within the last 10 years: no If all of the above answers are "NO", then may proceed with Cephalosporin use.   Sulfonamide Derivatives Swelling, Other (See Comments)   Massive extremity swelling       Medication List        Accurate as of 11/03/17 10:19 AM. Always use your most recent med list.          dexamethasone 4 MG tablet Commonly known as:  DECADRON Take 2 tablets (8 mg total) by mouth daily. Start the day after chemo for 2 days.   fluticasone 50 MCG/ACT nasal spray Commonly known as:  FLONASE Place 1 spray into both nostrils 2 (two) times daily.   LORazepam 1 MG tablet Commonly known as:  ATIVAN Take 1 tablet (1 mg total) by mouth every 6 (six) hours as needed (NAUSEA).   morphine 15 MG tablet Commonly known as:  MSIR Take 0.5 tablets (7.5 mg total) by mouth every 4 (four) hours as needed for severe pain.   morphine 30 MG 12 hr tablet Commonly known as:  MS CONTIN Take 1 tablet (30 mg total) by mouth every 12 (twelve) hours.  ondansetron 8 MG tablet Commonly known as:  ZOFRAN Take 1 tablet (8 mg total) by mouth 2 (two) times daily as needed for refractory nausea / vomiting. Start on day 3 after chemotherapy.   pregabalin 75 MG capsule Commonly known as:  LYRICA Take 1 capsule (75 mg total) by mouth 2 (two) times daily.   prochlorperazine 10 MG tablet Commonly known as:  COMPAZINE Take 1 tablet (10 mg total) by mouth every 6 (six) hours as needed (NAUSEA).   traMADol 50 MG tablet Commonly known as:  ULTRAM Take 1 tablet (50 mg total) by mouth every 6 (six) hours as needed.       Allergies:  Allergies  Allergen Reactions  . Capecitabine Hives and Other (See Comments)    Legs  and arms, after completing therapy  . Bactrim [Sulfamethoxazole-Trimethoprim] Swelling and Other (See Comments)    Severe swelling  . Oxaprozin Hives  . Penicillins Hives and Other (See Comments)    Has patient had a PCN reaction causing immediate rash, facial/tongue/throat swelling, SOB or lightheadedness with hypotension: no Has patient had a PCN reaction causing severe rash involving mucus membranes or skin necrosis: {no Has patient had a PCN reaction that required hospitalization no Has patient had a PCN reaction occurring within the last 10 years: no If all of the above answers are "NO", then may proceed with Cephalosporin use.  . Sulfonamide Derivatives Swelling and Other (See Comments)    Massive extremity swelling     Past Medical History, Surgical history, Social history, and Family History were reviewed and updated.  Review of Systems: Review of Systems  Constitutional: Negative.   HENT: Negative.   Eyes: Negative.   Respiratory: Negative.   Cardiovascular: Negative.   Gastrointestinal: Negative.   Genitourinary: Negative.   Musculoskeletal: Negative.   Skin: Negative.   Neurological: Negative.   Endo/Heme/Allergies: Negative.   Psychiatric/Behavioral: Negative.   All other systems reviewed and are negative.   Physical Exam:  weight is 164 lb (74.4 kg). Her oral temperature is 97.6 F (36.4 C). Her blood pressure is 111/69 and her pulse is 72. Her respiration is 18 and oxygen saturation is 100%.   Wt Readings from Last 3 Encounters:  11/03/17 164 lb (74.4 kg)  10/20/17 160 lb (72.6 kg)  09/22/17 155 lb 8 oz (70.5 kg)    Physical Exam  Constitutional: She is oriented to person, place, and time.  HENT:  Head: Normocephalic and atraumatic.  Mouth/Throat: Oropharynx is clear and moist.  Eyes: Pupils are equal, round, and reactive to light. EOM are normal.  Neck: Normal range of motion.  Cardiovascular: Normal rate, regular rhythm and normal heart sounds.    Pulmonary/Chest: Effort normal and breath sounds normal.  Abdominal: Soft. Bowel sounds are normal.  She has a colostomy in the lower abdomen.  She has the fistula which is about 3 mm in size at the caudal end of her laparotomy scar.  The laparotomy scar is well  Musculoskeletal: Normal range of motion. She exhibits no edema, tenderness or deformity.  Lymphadenopathy:    She has no cervical adenopathy.  Neurological: She is alert and oriented to person, place, and time.  Skin: Skin is warm and dry. No rash noted. No erythema.  Psychiatric: She has a normal mood and affect. Her behavior is normal. Judgment and thought content normal.  Vitals reviewed.    Lab Results  Component Value Date   WBC 5.0 11/03/2017   HGB 12.0 11/03/2017   HCT 37.2  11/03/2017   MCV 91.0 11/03/2017   PLT 249 11/03/2017   Lab Results  Component Value Date   FERRITIN 253 07/01/2017   IRON 52 07/01/2017   TIBC 266 07/01/2017   UIBC 214 07/01/2017   IRONPCTSAT 20 (L) 07/01/2017   Lab Results  Component Value Date   RETICCTPCT 0.8 02/12/2015   RBC 4.09 11/03/2017   No results found for: KPAFRELGTCHN, LAMBDASER, KAPLAMBRATIO No results found for: IGGSERUM, IGA, IGMSERUM No results found for: Odetta Pink, SPEI   Chemistry      Component Value Date/Time   NA 138 11/03/2017 0848   NA 145 03/27/2017 1101   NA 141 07/30/2016 0750   K 4.1 11/03/2017 0848   K 4.5 03/27/2017 1101   K 4.2 07/30/2016 0750   CL 104 11/03/2017 0848   CL 103 03/27/2017 1101   CO2 29 11/03/2017 0848   CO2 28 03/27/2017 1101   CO2 26 07/30/2016 0750   BUN 13 11/03/2017 0848   BUN 12 03/27/2017 1101   BUN 12.1 07/30/2016 0750   CREATININE 0.90 11/03/2017 0848   CREATININE 0.9 03/27/2017 1101   CREATININE 0.7 07/30/2016 0750      Component Value Date/Time   CALCIUM 9.3 11/03/2017 0848   CALCIUM 9.6 03/27/2017 1101   CALCIUM 9.5 07/30/2016 0750   ALKPHOS 91 (H)  11/03/2017 0848   ALKPHOS 101 (H) 03/27/2017 1101   ALKPHOS 72 07/30/2016 0750   AST 29 11/03/2017 0848   AST 22 07/30/2016 0750   ALT 32 11/03/2017 0848   ALT 20 03/27/2017 1101   ALT 22 07/30/2016 0750   BILITOT 0.7 11/03/2017 0848   BILITOT 0.54 07/30/2016 0750      Impression and Plan: Carla Mills is a very pleasant 47 yo caucasian female with recurrent colon cancer - HER2 (-)/KRAS (+)/MSI stable/MMR normal/BRAF -.   We will still proceed with her eighth cycle of treatment.    I am holding the Avastin.  I would absolutely love to give Avastin but again I have no idea when she is going to have her surgery.  If I would have known that she would have had these delays, I probably would have given her Avastin a couple cycles ago.  We will plan to get her back in 2 more weeks.  Volanda Napoleon, MD 8/12/201910:19 AM

## 2017-11-03 NOTE — Addendum Note (Signed)
Addended by: Burney Gauze R on: 11/03/2017 10:26 AM   Modules accepted: Orders

## 2017-11-05 ENCOUNTER — Inpatient Hospital Stay: Payer: BLUE CROSS/BLUE SHIELD

## 2017-11-05 VITALS — BP 107/67 | HR 72 | Temp 98.0°F | Resp 20

## 2017-11-05 DIAGNOSIS — C189 Malignant neoplasm of colon, unspecified: Secondary | ICD-10-CM

## 2017-11-05 DIAGNOSIS — Z923 Personal history of irradiation: Secondary | ICD-10-CM | POA: Diagnosis not present

## 2017-11-05 DIAGNOSIS — C772 Secondary and unspecified malignant neoplasm of intra-abdominal lymph nodes: Principal | ICD-10-CM

## 2017-11-05 DIAGNOSIS — C187 Malignant neoplasm of sigmoid colon: Secondary | ICD-10-CM

## 2017-11-05 DIAGNOSIS — Z9221 Personal history of antineoplastic chemotherapy: Secondary | ICD-10-CM | POA: Diagnosis not present

## 2017-11-05 DIAGNOSIS — Z5111 Encounter for antineoplastic chemotherapy: Secondary | ICD-10-CM | POA: Diagnosis not present

## 2017-11-05 MED ORDER — SODIUM CHLORIDE 0.9% FLUSH
10.0000 mL | INTRAVENOUS | Status: DC | PRN
  Administered 2017-11-05: 10 mL
  Filled 2017-11-05: qty 10

## 2017-11-05 MED ORDER — HEPARIN SOD (PORK) LOCK FLUSH 100 UNIT/ML IV SOLN
500.0000 [IU] | Freq: Once | INTRAVENOUS | Status: AC | PRN
  Administered 2017-11-05: 500 [IU]
  Filled 2017-11-05: qty 5

## 2017-11-07 DIAGNOSIS — C189 Malignant neoplasm of colon, unspecified: Secondary | ICD-10-CM | POA: Diagnosis not present

## 2017-11-07 DIAGNOSIS — C19 Malignant neoplasm of rectosigmoid junction: Secondary | ICD-10-CM | POA: Diagnosis not present

## 2017-11-10 ENCOUNTER — Other Ambulatory Visit: Payer: Self-pay | Admitting: *Deleted

## 2017-11-10 MED ORDER — MORPHINE SULFATE ER 30 MG PO TBCR: 30.0000 mg | EXTENDED_RELEASE_TABLET | Freq: Two times a day (BID) | ORAL | 0 refills | Status: DC

## 2017-11-12 MED FILL — DEXAMETHASONE 4 MG TABLET: 4 | 6 days supply | Qty: 12 | Fill #2

## 2017-11-14 DIAGNOSIS — C189 Malignant neoplasm of colon, unspecified: Secondary | ICD-10-CM | POA: Diagnosis not present

## 2017-11-14 MED FILL — MORPHINE SULF ER 30 MG TAB: 30 | 30 days supply | Qty: 60 | Fill #0

## 2017-11-17 ENCOUNTER — Inpatient Hospital Stay (HOSPITAL_BASED_OUTPATIENT_CLINIC_OR_DEPARTMENT_OTHER): Payer: BLUE CROSS/BLUE SHIELD | Admitting: Hematology & Oncology

## 2017-11-17 ENCOUNTER — Inpatient Hospital Stay: Payer: BLUE CROSS/BLUE SHIELD

## 2017-11-17 ENCOUNTER — Other Ambulatory Visit: Payer: Self-pay

## 2017-11-17 VITALS — BP 110/66 | HR 76 | Temp 98.0°F | Resp 18 | Wt 165.8 lb

## 2017-11-17 DIAGNOSIS — C189 Malignant neoplasm of colon, unspecified: Secondary | ICD-10-CM | POA: Diagnosis not present

## 2017-11-17 DIAGNOSIS — Z9221 Personal history of antineoplastic chemotherapy: Secondary | ICD-10-CM

## 2017-11-17 DIAGNOSIS — C187 Malignant neoplasm of sigmoid colon: Secondary | ICD-10-CM

## 2017-11-17 DIAGNOSIS — Z5111 Encounter for antineoplastic chemotherapy: Secondary | ICD-10-CM | POA: Diagnosis not present

## 2017-11-17 DIAGNOSIS — Z923 Personal history of irradiation: Secondary | ICD-10-CM | POA: Diagnosis not present

## 2017-11-17 DIAGNOSIS — C772 Secondary and unspecified malignant neoplasm of intra-abdominal lymph nodes: Principal | ICD-10-CM

## 2017-11-17 LAB — CBC WITH DIFFERENTIAL (CANCER CENTER ONLY)
BASOS ABS: 0 10*3/uL (ref 0.0–0.1)
BASOS PCT: 1 %
Eosinophils Absolute: 0.2 10*3/uL (ref 0.0–0.5)
Eosinophils Relative: 4 %
HCT: 37.1 % (ref 34.8–46.6)
Hemoglobin: 12 g/dL (ref 11.6–15.9)
LYMPHS PCT: 11 %
Lymphs Abs: 0.6 10*3/uL — ABNORMAL LOW (ref 0.9–3.3)
MCH: 29.6 pg (ref 26.0–34.0)
MCHC: 32.3 g/dL (ref 32.0–36.0)
MCV: 91.6 fL (ref 81.0–101.0)
Monocytes Absolute: 0.6 10*3/uL (ref 0.1–0.9)
Monocytes Relative: 11 %
NEUTROS ABS: 3.8 10*3/uL (ref 1.5–6.5)
Neutrophils Relative %: 73 %
PLATELETS: 229 10*3/uL (ref 145–400)
RBC: 4.05 MIL/uL (ref 3.70–5.32)
RDW: 13.7 % (ref 11.1–15.7)
WBC: 5.3 10*3/uL (ref 3.9–10.0)

## 2017-11-17 LAB — IRON AND TIBC
Iron: 112 ug/dL (ref 41–142)
SATURATION RATIOS: 40 % (ref 21–57)
TIBC: 280 ug/dL (ref 236–444)
UIBC: 168 ug/dL

## 2017-11-17 LAB — CMP (CANCER CENTER ONLY)
ALK PHOS: 104 U/L — AB (ref 26–84)
ALT: 43 U/L (ref 10–47)
ANION GAP: 7 (ref 5–15)
AST: 37 U/L (ref 11–38)
Albumin: 3.3 g/dL — ABNORMAL LOW (ref 3.5–5.0)
BUN: 12 mg/dL (ref 7–22)
CO2: 28 mmol/L (ref 18–33)
Calcium: 9.4 mg/dL (ref 8.0–10.3)
Chloride: 104 mmol/L (ref 98–108)
Creatinine: 0.7 mg/dL (ref 0.60–1.20)
Glucose, Bld: 85 mg/dL (ref 73–118)
POTASSIUM: 3.8 mmol/L (ref 3.3–4.7)
Sodium: 139 mmol/L (ref 128–145)
TOTAL PROTEIN: 6.4 g/dL (ref 6.4–8.1)
Total Bilirubin: 0.7 mg/dL (ref 0.2–1.6)

## 2017-11-17 LAB — FERRITIN: FERRITIN: 208 ng/mL (ref 11–307)

## 2017-11-17 LAB — CEA (IN HOUSE-CHCC): CEA (CHCC-In House): 1.9 ng/mL (ref 0.00–5.00)

## 2017-11-17 LAB — LACTATE DEHYDROGENASE: LDH: 158 U/L (ref 98–192)

## 2017-11-17 MED ORDER — SODIUM CHLORIDE 0.9 % IV SOLN
INTRAVENOUS | Status: DC
  Administered 2017-11-17: 10:00:00 via INTRAVENOUS
  Filled 2017-11-17: qty 250

## 2017-11-17 MED ORDER — DEXAMETHASONE SODIUM PHOSPHATE 10 MG/ML IJ SOLN
10.0000 mg | Freq: Once | INTRAMUSCULAR | Status: AC
  Administered 2017-11-17: 10 mg via INTRAVENOUS

## 2017-11-17 MED ORDER — SODIUM CHLORIDE 0.9 % IV SOLN
2900.0000 mg/m2 | INTRAVENOUS | Status: DC
  Administered 2017-11-17: 5100 mg via INTRAVENOUS
  Filled 2017-11-17: qty 102

## 2017-11-17 MED ORDER — IRINOTECAN HCL CHEMO INJECTION 100 MG/5ML
132.0000 mg/m2 | Freq: Once | INTRAVENOUS | Status: AC
  Administered 2017-11-17: 240 mg via INTRAVENOUS
  Filled 2017-11-17: qty 2

## 2017-11-17 MED ORDER — LEUCOVORIN CALCIUM INJECTION 350 MG
200.0000 mg/m2 | Freq: Once | INTRAVENOUS | Status: AC
  Administered 2017-11-17: 350 mg via INTRAVENOUS
  Filled 2017-11-17: qty 17.5

## 2017-11-17 MED ORDER — DEXAMETHASONE SODIUM PHOSPHATE 10 MG/ML IJ SOLN
INTRAMUSCULAR | Status: AC
  Filled 2017-11-17: qty 1

## 2017-11-17 MED ORDER — PALONOSETRON HCL INJECTION 0.25 MG/5ML
0.2500 mg | Freq: Once | INTRAVENOUS | Status: AC
  Administered 2017-11-17: 0.25 mg via INTRAVENOUS

## 2017-11-17 MED ORDER — PALONOSETRON HCL INJECTION 0.25 MG/5ML
INTRAVENOUS | Status: AC
  Filled 2017-11-17: qty 5

## 2017-11-17 NOTE — Progress Notes (Signed)
Hematology and Oncology Follow Up Visit  Carla Mills [id number] 03/11/1971 47 y.o. 11/17/2017   Principle Diagnosis:  Recurrent colon cancer - HER2 (-)/KRAS mutant/MSI stable/MMR normal/BRAF (wt) - inoperable  Past Therapy:   FOLFOXIRI - s/p cycle #12 - completed on 11/12/2016 Status post exploratory laparotomy with HIPEC - 07/29/2016  Current Therapy: Radiation therapy with Xeloda - completed on 06/06/2017 FOLFOXIRI/Avastin - s/p cycle #8 (Avastin on hold)   Interim History:  Carla Mills is back for follow-up.  She finally had her MRI done.  It was done at [facility].  It did show a mass around the rectal stump.  Also noted was a presacral collection of tissue with an air-fluid level.  It measures 5.3 x 1 x 2 cm.  There is no evidence of disease elsewhere.  She is to undergo a sigmoidoscopy this Wednesday.  I am not sure why she needs a sigmoidoscopy but it sounds like this might be something with respect to surgery that the sigmoidoscopy will help with.  She sees Dr. Crisoforo Oxford after Labor Day.  Hopefully, the surgery will finally be set up so that she can have this resected.  Overall, she is doing okay.  She has really had very little toxicity from the chemotherapy.  I have had to omit the Avastin right now as I keep hoping that her surgery is going to be scheduled.  She has had no problems with the ostomy.  She has had no cough or shortness of breath.  Her pain is very well controlled.  Overall, her performance status is ECOG 0.    Medications:  Allergies as of 11/17/2017      Reactions   Capecitabine Hives, Other (See Comments)   Legs and arms, after completing therapy   Bactrim [sulfamethoxazole-trimethoprim] Swelling, Other (See Comments)   Severe swelling   Oxaprozin Hives   Penicillins Hives, Other (See Comments)   Has patient had a PCN reaction causing immediate rash, facial/tongue/throat swelling, SOB or lightheadedness with hypotension: no Has patient had a PCN  reaction causing severe rash involving mucus membranes or skin necrosis: {no Has patient had a PCN reaction that required hospitalization no Has patient had a PCN reaction occurring within the last 10 years: no If all of the above answers are "NO", then may proceed with Cephalosporin use.   Sulfonamide Derivatives Swelling, Other (See Comments)   Massive extremity swelling       Medication List        Accurate as of 11/17/17  9:46 AM. Always use your most recent med list.          dexamethasone 4 MG tablet Commonly known as:  DECADRON Take 2 tablets (8 mg total) by mouth daily. Start the day after chemo for 2 days.   fluticasone 50 MCG/ACT nasal spray Commonly known as:  FLONASE Place 1 spray into both nostrils 2 (two) times daily.   LORazepam 1 MG tablet Commonly known as:  ATIVAN Take 1 tablet (1 mg total) by mouth every 6 (six) hours as needed (NAUSEA).   morphine 15 MG tablet Commonly known as:  MSIR Take 0.5 tablets (7.5 mg total) by mouth every 4 (four) hours as needed for severe pain.   morphine 30 MG 12 hr tablet Commonly known as:  MS CONTIN Take 1 tablet (30 mg total) by mouth every 12 (twelve) hours.   ondansetron 8 MG tablet Commonly known as:  ZOFRAN Take 1 tablet (8 mg total) by mouth 2 (two) times daily as  needed for refractory nausea / vomiting. Start on day 3 after chemotherapy.   pregabalin 75 MG capsule Commonly known as:  LYRICA Take 1 capsule (75 mg total) by mouth 2 (two) times daily.   prochlorperazine 10 MG tablet Commonly known as:  COMPAZINE Take 1 tablet (10 mg total) by mouth every 6 (six) hours as needed (NAUSEA).   traMADol 50 MG tablet Commonly known as:  ULTRAM Take 1 tablet (50 mg total) by mouth every 6 (six) hours as needed.       Allergies:  Allergies  Allergen Reactions  . Capecitabine Hives and Other (See Comments)    Legs and arms, after completing therapy  . Bactrim [Sulfamethoxazole-Trimethoprim] Swelling and Other  (See Comments)    Severe swelling  . Oxaprozin Hives  . Penicillins Hives and Other (See Comments)    Has patient had a PCN reaction causing immediate rash, facial/tongue/throat swelling, SOB or lightheadedness with hypotension: no Has patient had a PCN reaction causing severe rash involving mucus membranes or skin necrosis: {no Has patient had a PCN reaction that required hospitalization no Has patient had a PCN reaction occurring within the last 10 years: no If all of the above answers are "NO", then may proceed with Cephalosporin use.  . Sulfonamide Derivatives Swelling and Other (See Comments)    Massive extremity swelling     Past Medical History, Surgical history, Social history, and Family History were reviewed and updated.  Review of Systems: Review of Systems  Constitutional: Negative.   HENT: Negative.   Eyes: Negative.   Respiratory: Negative.   Cardiovascular: Negative.   Gastrointestinal: Negative.   Genitourinary: Negative.   Musculoskeletal: Negative.   Skin: Negative.   Neurological: Negative.   Endo/Heme/Allergies: Negative.   Psychiatric/Behavioral: Negative.   All other systems reviewed and are negative.   Physical Exam:  weight is 165 lb 12 oz (75.2 kg). Her oral temperature is 98 F (36.7 C). Her blood pressure is 110/66 and her pulse is 76. Her respiration is 18 and oxygen saturation is 100%.   Wt Readings from Last 3 Encounters:  11/17/17 165 lb 12 oz (75.2 kg)  11/03/17 164 lb (74.4 kg)  10/20/17 160 lb (72.6 kg)    Physical Exam  Constitutional: She is oriented to person, place, and time.  HENT:  Head: Normocephalic and atraumatic.  Mouth/Throat: Oropharynx is clear and moist.  Eyes: Pupils are equal, round, and reactive to light. EOM are normal.  Neck: Normal range of motion.  Cardiovascular: Normal rate, regular rhythm and normal heart sounds.  Pulmonary/Chest: Effort normal and breath sounds normal.  Abdominal: Soft. Bowel sounds are  normal.  She has a colostomy in the lower abdomen.  She has the fistula which is about 3 mm in size at the caudal end of her laparotomy scar.  The laparotomy scar is well  Musculoskeletal: Normal range of motion. She exhibits no edema, tenderness or deformity.  Lymphadenopathy:    She has no cervical adenopathy.  Neurological: She is alert and oriented to person, place, and time.  Skin: Skin is warm and dry. No rash noted. No erythema.  Psychiatric: She has a normal mood and affect. Her behavior is normal. Judgment and thought content normal.  Vitals reviewed.    Lab Results  Component Value Date   WBC 5.3 11/17/2017   HGB 12.0 11/17/2017   HCT 37.1 11/17/2017   MCV 91.6 11/17/2017   PLT 229 11/17/2017   Lab Results  Component Value Date  FERRITIN 253 07/01/2017   IRON 52 07/01/2017   TIBC 266 07/01/2017   UIBC 214 07/01/2017   IRONPCTSAT 20 (L) 07/01/2017   Lab Results  Component Value Date   RETICCTPCT 0.8 02/12/2015   RBC 4.05 11/17/2017   No results found for: KPAFRELGTCHN, LAMBDASER, KAPLAMBRATIO No results found for: IGGSERUM, IGA, IGMSERUM No results found for: Kathrynn Ducking, MSPIKE, SPEI   Chemistry      Component Value Date/Time   NA 139 11/17/2017 0840   NA 145 03/27/2017 1101   NA 141 07/30/2016 0750   K 3.8 11/17/2017 0840   K 4.5 03/27/2017 1101   K 4.2 07/30/2016 0750   CL 104 11/17/2017 0840   CL 103 03/27/2017 1101   CO2 28 11/17/2017 0840   CO2 28 03/27/2017 1101   CO2 26 07/30/2016 0750   BUN 12 11/17/2017 0840   BUN 12 03/27/2017 1101   BUN 12.1 07/30/2016 0750   CREATININE 0.70 11/17/2017 0840   CREATININE 0.9 03/27/2017 1101   CREATININE 0.7 07/30/2016 0750      Component Value Date/Time   CALCIUM 9.4 11/17/2017 0840   CALCIUM 9.6 03/27/2017 1101   CALCIUM 9.5 07/30/2016 0750   ALKPHOS 104 (H) 11/17/2017 0840   ALKPHOS 101 (H) 03/27/2017 1101   ALKPHOS 72 07/30/2016 0750   AST 37  11/17/2017 0840   AST 22 07/30/2016 0750   ALT 43 11/17/2017 0840   ALT 20 03/27/2017 1101   ALT 22 07/30/2016 0750   BILITOT 0.7 11/17/2017 0840   BILITOT 0.54 07/30/2016 0750      Impression and Plan: Carla Mills is a very pleasant 47 yo caucasian female with recurrent colon cancer - HER2 (-)/KRAS (+)/MSI stable/MMR normal/BRAF -.   We will still proceed with her 9th cycle of treatment.  This will be her last treatment before surgery.  I really do not want to give her any further treatment.  I want to make sure that her blood counts are going to be okay.  I think the MRI looks great.  I do not see anything that would prevent surgery from resecting out this disease.  We will continue to pray hard for her.  She really has done a fantastic job.  I will plan to see her back in 3 weeks so we can check her labs and see how she is doing.  Volanda Napoleon, MD 8/26/20199:46 AM

## 2017-11-19 ENCOUNTER — Inpatient Hospital Stay: Payer: BLUE CROSS/BLUE SHIELD

## 2017-11-19 VITALS — BP 124/75 | HR 73 | Temp 98.3°F | Resp 18

## 2017-11-19 DIAGNOSIS — C189 Malignant neoplasm of colon, unspecified: Secondary | ICD-10-CM | POA: Diagnosis not present

## 2017-11-19 DIAGNOSIS — R933 Abnormal findings on diagnostic imaging of other parts of digestive tract: Secondary | ICD-10-CM | POA: Diagnosis not present

## 2017-11-19 DIAGNOSIS — Z9189 Other specified personal risk factors, not elsewhere classified: Secondary | ICD-10-CM | POA: Diagnosis not present

## 2017-11-19 DIAGNOSIS — K626 Ulcer of anus and rectum: Secondary | ICD-10-CM | POA: Diagnosis not present

## 2017-11-19 DIAGNOSIS — C772 Secondary and unspecified malignant neoplasm of intra-abdominal lymph nodes: Principal | ICD-10-CM

## 2017-11-19 DIAGNOSIS — Z9221 Personal history of antineoplastic chemotherapy: Secondary | ICD-10-CM | POA: Diagnosis not present

## 2017-11-19 DIAGNOSIS — C187 Malignant neoplasm of sigmoid colon: Secondary | ICD-10-CM

## 2017-11-19 DIAGNOSIS — Z5111 Encounter for antineoplastic chemotherapy: Secondary | ICD-10-CM | POA: Diagnosis not present

## 2017-11-19 DIAGNOSIS — Z923 Personal history of irradiation: Secondary | ICD-10-CM | POA: Diagnosis not present

## 2017-11-19 MED ORDER — SODIUM CHLORIDE 0.9% FLUSH
10.0000 mL | INTRAVENOUS | Status: DC | PRN
  Administered 2017-11-19: 10 mL
  Filled 2017-11-19: qty 10

## 2017-11-19 NOTE — Progress Notes (Signed)
Patient requested to have the port accessed for a radiologic study that she'll have this afternoon.

## 2017-11-20 MED FILL — PREGABALIN 75 MG CAPS: 75 | 30 days supply | Qty: 60 | Fill #3

## 2017-11-26 DIAGNOSIS — Z85038 Personal history of other malignant neoplasm of large intestine: Secondary | ICD-10-CM | POA: Diagnosis not present

## 2017-11-26 DIAGNOSIS — Z933 Colostomy status: Secondary | ICD-10-CM | POA: Diagnosis not present

## 2017-11-27 ENCOUNTER — Telehealth: Payer: Self-pay | Admitting: *Deleted

## 2017-11-27 NOTE — Telephone Encounter (Signed)
Message received from patient requesting to see Dr. Marin Olp prior to scheduled surgery on 12/03/17.  Per Dr. Marin Olp, move scheduled appts from 12/08/17 to 12/02/17.  Call placed to pt to inform her of Dr. Antonieta Pert orders.  Patient transferred to scheduling to make appt change.

## 2017-11-28 DIAGNOSIS — R911 Solitary pulmonary nodule: Secondary | ICD-10-CM | POA: Diagnosis not present

## 2017-11-28 DIAGNOSIS — C19 Malignant neoplasm of rectosigmoid junction: Secondary | ICD-10-CM | POA: Diagnosis not present

## 2017-11-28 DIAGNOSIS — N2889 Other specified disorders of kidney and ureter: Secondary | ICD-10-CM | POA: Diagnosis not present

## 2017-11-28 DIAGNOSIS — C189 Malignant neoplasm of colon, unspecified: Secondary | ICD-10-CM | POA: Diagnosis not present

## 2017-11-28 DIAGNOSIS — Z23 Encounter for immunization: Secondary | ICD-10-CM | POA: Diagnosis not present

## 2017-11-28 MED FILL — NEOMYCIN 500 MG TABLET: 500 | 1 days supply | Qty: 6 | Fill #0

## 2017-11-28 MED FILL — ERYTHROMYCIN 500 MG FILMTAB: 500 | 1 days supply | Qty: 6 | Fill #0

## 2017-12-02 ENCOUNTER — Inpatient Hospital Stay: Payer: BLUE CROSS/BLUE SHIELD

## 2017-12-02 ENCOUNTER — Inpatient Hospital Stay: Payer: BLUE CROSS/BLUE SHIELD | Attending: Radiation Oncology

## 2017-12-02 ENCOUNTER — Other Ambulatory Visit: Payer: Self-pay

## 2017-12-02 ENCOUNTER — Inpatient Hospital Stay (HOSPITAL_BASED_OUTPATIENT_CLINIC_OR_DEPARTMENT_OTHER): Payer: BLUE CROSS/BLUE SHIELD | Admitting: Hematology & Oncology

## 2017-12-02 ENCOUNTER — Encounter: Payer: Self-pay | Admitting: Hematology & Oncology

## 2017-12-02 VITALS — BP 120/86 | HR 74 | Temp 98.3°F | Wt 168.1 lb

## 2017-12-02 DIAGNOSIS — Z95828 Presence of other vascular implants and grafts: Secondary | ICD-10-CM

## 2017-12-02 DIAGNOSIS — Z933 Colostomy status: Secondary | ICD-10-CM | POA: Insufficient documentation

## 2017-12-02 DIAGNOSIS — C189 Malignant neoplasm of colon, unspecified: Secondary | ICD-10-CM

## 2017-12-02 DIAGNOSIS — R198 Other specified symptoms and signs involving the digestive system and abdomen: Secondary | ICD-10-CM

## 2017-12-02 LAB — CBC WITH DIFFERENTIAL (CANCER CENTER ONLY)
BASOS ABS: 0 10*3/uL (ref 0.0–0.1)
Basophils Relative: 0 %
EOS ABS: 0.1 10*3/uL (ref 0.0–0.5)
EOS PCT: 3 %
HCT: 36.5 % (ref 34.8–46.6)
Hemoglobin: 12 g/dL (ref 11.6–15.9)
Lymphocytes Relative: 15 %
Lymphs Abs: 0.7 10*3/uL — ABNORMAL LOW (ref 0.9–3.3)
MCH: 29.8 pg (ref 26.0–34.0)
MCHC: 32.9 g/dL (ref 32.0–36.0)
MCV: 90.6 fL (ref 81.0–101.0)
Monocytes Absolute: 0.4 10*3/uL (ref 0.1–0.9)
Monocytes Relative: 9 %
Neutro Abs: 3.5 10*3/uL (ref 1.5–6.5)
Neutrophils Relative %: 73 %
PLATELETS: 234 10*3/uL (ref 145–400)
RBC: 4.03 MIL/uL (ref 3.70–5.32)
RDW: 13.3 % (ref 11.1–15.7)
WBC: 4.7 10*3/uL (ref 3.9–10.0)

## 2017-12-02 LAB — CMP (CANCER CENTER ONLY)
ALT: 20 U/L (ref 0–44)
AST: 21 U/L (ref 15–41)
Albumin: 3.8 g/dL (ref 3.5–5.0)
Alkaline Phosphatase: 100 U/L (ref 38–126)
Anion gap: 9 (ref 5–15)
BUN: 14 mg/dL (ref 6–20)
CHLORIDE: 108 mmol/L (ref 98–111)
CO2: 26 mmol/L (ref 22–32)
CREATININE: 0.74 mg/dL (ref 0.44–1.00)
Calcium: 9.5 mg/dL (ref 8.9–10.3)
GFR, Estimated: >60 mL/min (ref >60)
Glucose, Bld: 84 mg/dL (ref 70–99)
Potassium: 4 mmol/L (ref 3.5–5.1)
Sodium: 143 mmol/L (ref 135–145)
Total Bilirubin: 0.4 mg/dL (ref 0.3–1.2)
Total Protein: 6.8 g/dL (ref 6.5–8.1)

## 2017-12-02 LAB — LACTATE DEHYDROGENASE: LDH: 181 U/L (ref 98–192)

## 2017-12-02 MED ORDER — SODIUM CHLORIDE 0.9% FLUSH
10.0000 mL | INTRAVENOUS | Status: DC | PRN
  Administered 2017-12-02: 10 mL via INTRAVENOUS
  Filled 2017-12-02: qty 10

## 2017-12-02 MED ORDER — HEPARIN SOD (PORK) LOCK FLUSH 100 UNIT/ML IV SOLN
500.0000 [IU] | Freq: Once | INTRAVENOUS | Status: AC
  Administered 2017-12-02: 500 [IU] via INTRAVENOUS
  Filled 2017-12-02: qty 5

## 2017-12-02 NOTE — Patient Instructions (Signed)
Implanted Port Insertion, Care After °This sheet gives you information about how to care for yourself after your procedure. Your health care provider may also give you more specific instructions. If you have problems or questions, contact your health care provider. °What can I expect after the procedure? °After your procedure, it is common to have: °· Discomfort at the port insertion site. °· Bruising on the skin over the port. This should improve over 3-4 days. ° °Follow these instructions at home: °Port care °· After your port is placed, you will get a manufacturer's information card. The card has information about your port. Keep this card with you at all times. °· Take care of the port as told by your health care provider. Ask your health care provider if you or a family member can get training for taking care of the port at home. A home health care nurse may also take care of the port. °· Make sure to remember what type of port you have. °Incision care °· Follow instructions from your health care provider about how to take care of your port insertion site. Make sure you: °? Wash your hands with soap and water before you change your bandage (dressing). If soap and water are not available, use hand sanitizer. °? Change your dressing as told by your health care provider. °? Leave stitches (sutures), skin glue, or adhesive strips in place. These skin closures may need to stay in place for 2 weeks or longer. If adhesive strip edges start to loosen and curl up, you may trim the loose edges. Do not remove adhesive strips completely unless your health care provider tells you to do that. °· Check your port insertion site every day for signs of infection. Check for: °? More redness, swelling, or pain. °? More fluid or blood. °? Warmth. °? Pus or a bad smell. °General instructions °· Do not take baths, swim, or use a hot tub until your health care provider approves. °· Do not lift anything that is heavier than 10 lb (4.5  kg) for a week, or as told by your health care provider. °· Ask your health care provider when it is okay to: °? Return to work or school. °? Resume usual physical activities or sports. °· Do not drive for 24 hours if you were given a medicine to help you relax (sedative). °· Take over-the-counter and prescription medicines only as told by your health care provider. °· Wear a medical alert bracelet in case of an emergency. This will tell any health care providers that you have a port. °· Keep all follow-up visits as told by your health care provider. This is important. °Contact a health care provider if: °· You cannot flush your port with saline as directed, or you cannot draw blood from the port. °· You have a fever or chills. °· You have more redness, swelling, or pain around your port insertion site. °· You have more fluid or blood coming from your port insertion site. °· Your port insertion site feels warm to the touch. °· You have pus or a bad smell coming from the port insertion site. °Get help right away if: °· You have chest pain or shortness of breath. °· You have bleeding from your port that you cannot control. °Summary °· Take care of the port as told by your health care provider. °· Change your dressing as told by your health care provider. °· Keep all follow-up visits as told by your health care provider. °  This information is not intended to replace advice given to you by your health care provider. Make sure you discuss any questions you have with your health care provider. °Document Released: 12/30/2012 Document Revised: 01/31/2016 Document Reviewed: 01/31/2016 °Elsevier Interactive Patient Education © 2017 [employer] ° °

## 2017-12-02 NOTE — Progress Notes (Signed)
Hematology and Oncology Follow Up Visit  Gracy Ehly [id number] 08/28/1970 47 y.o. 12/02/2017   Principle Diagnosis:  Recurrent colon cancer - HER2 (-)/KRAS mutant/MSI stable/MMR normal/BRAF (wt) - inoperable  Past Therapy:   FOLFOXIRI - s/p cycle #12 - completed on 11/12/2016 Status post exploratory laparotomy with HIPEC - 07/29/2016  Current Therapy: Radiation therapy with Xeloda - completed on 06/06/2017 FOLFOXIRI/Avastin - s/p cycle #8 (Avastin on hold)   Interim History:  Ms. Kerwood is back for follow-up.  She is going for surgery tomorrow at [facility].  This will be a very lengthy surgery.  Hopefully, her tumor will be able to be removed.  I will know if she has a lot of scar tissue.  She is in great shape.  She is ready for surgery.  Patient is done very well with chemotherapy.  Her last PET scan only showed activity in one area of the colon.  She still has some rectal discharge.  This will be corrected.  She will always have a colostomy.  The surgeon will enlarge this.  She has had no fever.  She has had no cough.  There is no shortness of breath.  I am sure that the postop recovery will include aggressive anticoagulation.    Her appetite has been great.  She starts her colonic prep this afternoon.    Overall, her performance status is ECOG 0.    Medications:  Allergies as of 12/02/2017      Reactions   Capecitabine Hives, Other (See Comments)   Legs and arms, after completing therapy   Bactrim [sulfamethoxazole-trimethoprim] Swelling, Other (See Comments)   Severe swelling   Oxaprozin Hives   Penicillins Hives, Other (See Comments)   Has patient had a PCN reaction causing immediate rash, facial/tongue/throat swelling, SOB or lightheadedness with hypotension: no Has patient had a PCN reaction causing severe rash involving mucus membranes or skin necrosis: {no Has patient had a PCN reaction that required hospitalization no Has patient had a PCN reaction  occurring within the last 10 years: no If all of the above answers are "NO", then may proceed with Cephalosporin use.   Sulfonamide Derivatives Swelling, Other (See Comments)   Massive extremity swelling       Medication List        Accurate as of 12/02/17  2:12 PM. Always use your most recent med list.          dexamethasone 4 MG tablet Commonly known as:  DECADRON Take 2 tablets (8 mg total) by mouth daily. Start the day after chemo for 2 days.   fluticasone 50 MCG/ACT nasal spray Commonly known as:  FLONASE Place 1 spray into both nostrils 2 (two) times daily.   ibuprofen 200 MG tablet Commonly known as:  ADVIL,MOTRIN Take 200 mg by mouth every 6 (six) hours as needed.   LORazepam 1 MG tablet Commonly known as:  ATIVAN Take 1 tablet (1 mg total) by mouth every 6 (six) hours as needed (NAUSEA).   morphine 15 MG tablet Commonly known as:  MSIR Take 0.5 tablets (7.5 mg total) by mouth every 4 (four) hours as needed for severe pain.   morphine 30 MG 12 hr tablet Commonly known as:  MS CONTIN Take 1 tablet (30 mg total) by mouth every 12 (twelve) hours.   ondansetron 8 MG tablet Commonly known as:  ZOFRAN Take 1 tablet (8 mg total) by mouth 2 (two) times daily as needed for refractory nausea / vomiting. Start on day 3 after  chemotherapy.   pregabalin 75 MG capsule Commonly known as:  LYRICA Take 1 capsule (75 mg total) by mouth 2 (two) times daily.   prochlorperazine 10 MG tablet Commonly known as:  COMPAZINE Take 1 tablet (10 mg total) by mouth every 6 (six) hours as needed (NAUSEA).   traMADol 50 MG tablet Commonly known as:  ULTRAM Take 1 tablet (50 mg total) by mouth every 6 (six) hours as needed.       Allergies:  Allergies  Allergen Reactions  . Capecitabine Hives and Other (See Comments)    Legs and arms, after completing therapy  . Bactrim [Sulfamethoxazole-Trimethoprim] Swelling and Other (See Comments)    Severe swelling  . Oxaprozin Hives  .  Penicillins Hives and Other (See Comments)    Has patient had a PCN reaction causing immediate rash, facial/tongue/throat swelling, SOB or lightheadedness with hypotension: no Has patient had a PCN reaction causing severe rash involving mucus membranes or skin necrosis: {no Has patient had a PCN reaction that required hospitalization no Has patient had a PCN reaction occurring within the last 10 years: no If all of the above answers are "NO", then may proceed with Cephalosporin use.  . Sulfonamide Derivatives Swelling and Other (See Comments)    Massive extremity swelling     Past Medical History, Surgical history, Social history, and Family History were reviewed and updated.  Review of Systems: Review of Systems  Constitutional: Negative.   HENT: Negative.   Eyes: Negative.   Respiratory: Negative.   Cardiovascular: Negative.   Gastrointestinal: Negative.   Genitourinary: Negative.   Musculoskeletal: Negative.   Skin: Negative.   Neurological: Negative.   Endo/Heme/Allergies: Negative.   Psychiatric/Behavioral: Negative.   All other systems reviewed and are negative.   Physical Exam:  weight is 168 lb 1.6 oz (76.2 kg). Her oral temperature is 98.3 F (36.8 C). Her blood pressure is 120/86 and her pulse is 74.   Wt Readings from Last 3 Encounters:  12/02/17 168 lb 1.6 oz (76.2 kg)  11/17/17 165 lb 12 oz (75.2 kg)  11/03/17 164 lb (74.4 kg)    Physical Exam  Constitutional: She is oriented to person, place, and time.  HENT:  Head: Normocephalic and atraumatic.  Mouth/Throat: Oropharynx is clear and moist.  Eyes: Pupils are equal, round, and reactive to light. EOM are normal.  Neck: Normal range of motion.  Cardiovascular: Normal rate, regular rhythm and normal heart sounds.  Pulmonary/Chest: Effort normal and breath sounds normal.  Abdominal: Soft. Bowel sounds are normal.  She has a colostomy in the lower abdomen.  She has the fistula which is about 3 mm in size at  the caudal end of her laparotomy scar.  The laparotomy scar is well  Musculoskeletal: Normal range of motion. She exhibits no edema, tenderness or deformity.  Lymphadenopathy:    She has no cervical adenopathy.  Neurological: She is alert and oriented to person, place, and time.  Skin: Skin is warm and dry. No rash noted. No erythema.  Psychiatric: She has a normal mood and affect. Her behavior is normal. Judgment and thought content normal.  Vitals reviewed.    Lab Results  Component Value Date   WBC 4.7 12/02/2017   HGB 12.0 12/02/2017   HCT 36.5 12/02/2017   MCV 90.6 12/02/2017   PLT 234 12/02/2017   Lab Results  Component Value Date   FERRITIN 208 11/17/2017   IRON 112 11/17/2017   TIBC 280 11/17/2017   UIBC 168 11/17/2017  IRONPCTSAT 40 11/17/2017   Lab Results  Component Value Date   RETICCTPCT 0.8 02/12/2015   RBC 4.03 12/02/2017   No results found for: KPAFRELGTCHN, LAMBDASER, KAPLAMBRATIO No results found for: IGGSERUM, IGA, IGMSERUM No results found for: Kathrynn Ducking, MSPIKE, SPEI   Chemistry      Component Value Date/Time   NA 139 11/17/2017 0840   NA 145 03/27/2017 1101   NA 141 07/30/2016 0750   K 3.8 11/17/2017 0840   K 4.5 03/27/2017 1101   K 4.2 07/30/2016 0750   CL 104 11/17/2017 0840   CL 103 03/27/2017 1101   CO2 28 11/17/2017 0840   CO2 28 03/27/2017 1101   CO2 26 07/30/2016 0750   BUN 12 11/17/2017 0840   BUN 12 03/27/2017 1101   BUN 12.1 07/30/2016 0750   CREATININE 0.70 11/17/2017 0840   CREATININE 0.9 03/27/2017 1101   CREATININE 0.7 07/30/2016 0750      Component Value Date/Time   CALCIUM 9.4 11/17/2017 0840   CALCIUM 9.6 03/27/2017 1101   CALCIUM 9.5 07/30/2016 0750   ALKPHOS 104 (H) 11/17/2017 0840   ALKPHOS 101 (H) 03/27/2017 1101   ALKPHOS 72 07/30/2016 0750   AST 37 11/17/2017 0840   AST 22 07/30/2016 0750   ALT 43 11/17/2017 0840   ALT 20 03/27/2017 1101   ALT 22 07/30/2016  0750   BILITOT 0.7 11/17/2017 0840   BILITOT 0.54 07/30/2016 0750      Impression and Plan: Ms. Hare is a very pleasant 47 yo caucasian female with recurrent colon cancer - HER2 (-)/KRAS (+)/MSI stable/MMR normal/BRAF -.   I think that we have done all that we can do with chemotherapy right now.  When she has her surgery, any viable cancer will be sent off for genetic analysis.  I know that we really have not been able to do all that much so far as her tumor has been K-ras positive.  May be, what might be residual could have a mutation or could be BRAF mutated or HER-2(+).  I will plan to see her back here in 6 weeks.  By then, she will have recovered.  We will be able to have the genetic profiling done.  We had a very good prayer session today so that we prayed that God will be in the operating room helping the surgeon.  Volanda Napoleon, MD 9/10/20192:12 PM

## 2017-12-03 DIAGNOSIS — G600 Hereditary motor and sensory neuropathy: Secondary | ICD-10-CM | POA: Diagnosis not present

## 2017-12-03 DIAGNOSIS — C19 Malignant neoplasm of rectosigmoid junction: Secondary | ICD-10-CM | POA: Diagnosis not present

## 2017-12-03 DIAGNOSIS — G8929 Other chronic pain: Secondary | ICD-10-CM | POA: Diagnosis not present

## 2017-12-03 DIAGNOSIS — R911 Solitary pulmonary nodule: Secondary | ICD-10-CM | POA: Diagnosis not present

## 2017-12-03 DIAGNOSIS — F419 Anxiety disorder, unspecified: Secondary | ICD-10-CM | POA: Diagnosis not present

## 2017-12-03 DIAGNOSIS — G8918 Other acute postprocedural pain: Secondary | ICD-10-CM | POA: Diagnosis not present

## 2017-12-03 DIAGNOSIS — R109 Unspecified abdominal pain: Secondary | ICD-10-CM | POA: Diagnosis not present

## 2017-12-03 DIAGNOSIS — N1330 Unspecified hydronephrosis: Secondary | ICD-10-CM | POA: Diagnosis not present

## 2017-12-03 DIAGNOSIS — K6289 Other specified diseases of anus and rectum: Secondary | ICD-10-CM | POA: Diagnosis not present

## 2017-12-03 DIAGNOSIS — Z803 Family history of malignant neoplasm of breast: Secondary | ICD-10-CM | POA: Diagnosis not present

## 2017-12-03 DIAGNOSIS — C20 Malignant neoplasm of rectum: Secondary | ICD-10-CM | POA: Diagnosis not present

## 2017-12-03 DIAGNOSIS — C189 Malignant neoplasm of colon, unspecified: Secondary | ICD-10-CM | POA: Diagnosis not present

## 2017-12-03 DIAGNOSIS — J309 Allergic rhinitis, unspecified: Secondary | ICD-10-CM | POA: Diagnosis not present

## 2017-12-03 HISTORY — PX: ABDOMINAL HYSTERECTOMY: SHX81

## 2017-12-03 LAB — IRON AND TIBC
Iron: 64 ug/dL (ref 41–142)
SATURATION RATIOS: 22 % (ref 21–57)
TIBC: 290 ug/dL (ref 236–444)
UIBC: 226 ug/dL

## 2017-12-03 LAB — CEA (IN HOUSE-CHCC): CEA (CHCC-In House): 2.21 ng/mL (ref 0.00–5.00)

## 2017-12-03 LAB — FERRITIN: Ferritin: 156 ng/mL (ref 11–307)

## 2017-12-04 DIAGNOSIS — G8918 Other acute postprocedural pain: Secondary | ICD-10-CM | POA: Diagnosis not present

## 2017-12-04 DIAGNOSIS — R109 Unspecified abdominal pain: Secondary | ICD-10-CM | POA: Diagnosis not present

## 2017-12-05 DIAGNOSIS — R109 Unspecified abdominal pain: Secondary | ICD-10-CM | POA: Diagnosis not present

## 2017-12-05 DIAGNOSIS — G8918 Other acute postprocedural pain: Secondary | ICD-10-CM | POA: Diagnosis not present

## 2017-12-06 DIAGNOSIS — G8918 Other acute postprocedural pain: Secondary | ICD-10-CM | POA: Diagnosis not present

## 2017-12-06 DIAGNOSIS — R109 Unspecified abdominal pain: Secondary | ICD-10-CM | POA: Diagnosis not present

## 2017-12-08 ENCOUNTER — Other Ambulatory Visit: Payer: BLUE CROSS/BLUE SHIELD

## 2017-12-08 ENCOUNTER — Ambulatory Visit: Payer: BLUE CROSS/BLUE SHIELD | Admitting: Hematology & Oncology

## 2017-12-08 MED FILL — ENOXAPARIN 40 MG/0.4 ML SYR: 40 | 14 days supply | Qty: 6 | Fill #0

## 2017-12-26 MED FILL — PREGABALIN 75 MG CAPS: 75 | 14 days supply | Qty: 28 | Fill #0

## 2017-12-31 ENCOUNTER — Telehealth: Payer: Self-pay

## 2017-12-31 NOTE — Telephone Encounter (Signed)
[facility] Requisition faxed to [facility] pathology for accession number [id number] Sample C: Proximal Rectum  & Uterus Resection. Lucy in pathology confirmed by phone that this request can be processed with Dr Antonieta Pert signature. dph

## 2018-01-06 DIAGNOSIS — C189 Malignant neoplasm of colon, unspecified: Secondary | ICD-10-CM | POA: Diagnosis not present

## 2018-01-14 DIAGNOSIS — M62830 Muscle spasm of back: Secondary | ICD-10-CM | POA: Diagnosis not present

## 2018-01-14 DIAGNOSIS — M9904 Segmental and somatic dysfunction of sacral region: Secondary | ICD-10-CM | POA: Diagnosis not present

## 2018-01-14 DIAGNOSIS — M545 Low back pain: Secondary | ICD-10-CM | POA: Diagnosis not present

## 2018-01-14 DIAGNOSIS — M9903 Segmental and somatic dysfunction of lumbar region: Secondary | ICD-10-CM | POA: Diagnosis not present

## 2018-01-19 DIAGNOSIS — M545 Low back pain: Secondary | ICD-10-CM | POA: Diagnosis not present

## 2018-01-19 DIAGNOSIS — M9903 Segmental and somatic dysfunction of lumbar region: Secondary | ICD-10-CM | POA: Diagnosis not present

## 2018-01-19 DIAGNOSIS — M62830 Muscle spasm of back: Secondary | ICD-10-CM | POA: Diagnosis not present

## 2018-01-19 DIAGNOSIS — M9904 Segmental and somatic dysfunction of sacral region: Secondary | ICD-10-CM | POA: Diagnosis not present

## 2018-01-20 ENCOUNTER — Other Ambulatory Visit: Payer: Self-pay

## 2018-01-20 ENCOUNTER — Inpatient Hospital Stay: Payer: BLUE CROSS/BLUE SHIELD | Attending: Radiation Oncology | Admitting: Hematology & Oncology

## 2018-01-20 ENCOUNTER — Inpatient Hospital Stay: Payer: BLUE CROSS/BLUE SHIELD

## 2018-01-20 ENCOUNTER — Encounter: Payer: Self-pay | Admitting: Hematology & Oncology

## 2018-01-20 ENCOUNTER — Inpatient Hospital Stay: Payer: BLUE CROSS/BLUE SHIELD | Admitting: Hematology & Oncology

## 2018-01-20 ENCOUNTER — Other Ambulatory Visit: Payer: Self-pay | Admitting: *Deleted

## 2018-01-20 ENCOUNTER — Telehealth: Payer: Self-pay | Admitting: *Deleted

## 2018-01-20 VITALS — BP 126/77 | HR 83 | Temp 98.3°F | Resp 16 | Wt 162.0 lb

## 2018-01-20 DIAGNOSIS — D500 Iron deficiency anemia secondary to blood loss (chronic): Secondary | ICD-10-CM

## 2018-01-20 DIAGNOSIS — D509 Iron deficiency anemia, unspecified: Secondary | ICD-10-CM | POA: Diagnosis not present

## 2018-01-20 DIAGNOSIS — Z923 Personal history of irradiation: Secondary | ICD-10-CM

## 2018-01-20 DIAGNOSIS — C189 Malignant neoplasm of colon, unspecified: Secondary | ICD-10-CM | POA: Diagnosis not present

## 2018-01-20 DIAGNOSIS — Z933 Colostomy status: Secondary | ICD-10-CM | POA: Diagnosis not present

## 2018-01-20 DIAGNOSIS — Z95828 Presence of other vascular implants and grafts: Secondary | ICD-10-CM

## 2018-01-20 LAB — CMP (CANCER CENTER ONLY)
ALBUMIN: 3.4 g/dL — AB (ref 3.5–5.0)
ALK PHOS: 87 U/L — AB (ref 26–84)
ALT: 16 U/L (ref 10–47)
ANION GAP: 4 — AB (ref 5–15)
AST: 20 U/L (ref 11–38)
BUN: 15 mg/dL (ref 7–22)
CO2: 25 mmol/L (ref 18–33)
CREATININE: 1 mg/dL (ref 0.60–1.20)
Calcium: 9.2 mg/dL (ref 8.0–10.3)
Chloride: 114 mmol/L — ABNORMAL HIGH (ref 98–108)
Glucose, Bld: 117 mg/dL (ref 73–118)
Potassium: 3.8 mmol/L (ref 3.3–4.7)
Sodium: 143 mmol/L (ref 128–145)
TOTAL PROTEIN: 6.9 g/dL (ref 6.4–8.1)
Total Bilirubin: 0.5 mg/dL (ref 0.2–1.6)

## 2018-01-20 LAB — CBC WITH DIFFERENTIAL (CANCER CENTER ONLY)
ABS IMMATURE GRANULOCYTES: 0.01 10*3/uL (ref 0.00–0.07)
BASOS PCT: 1 %
Basophils Absolute: 0 10*3/uL (ref 0.0–0.1)
Eosinophils Absolute: 0.3 10*3/uL (ref 0.0–0.5)
Eosinophils Relative: 5 %
HEMATOCRIT: 27 % — AB (ref 36.0–46.0)
HEMOGLOBIN: 7.9 g/dL — AB (ref 12.0–15.0)
IMMATURE GRANULOCYTES: 0 %
LYMPHS ABS: 0.7 10*3/uL (ref 0.7–4.0)
Lymphocytes Relative: 12 %
MCH: 24.3 pg — AB (ref 26.0–34.0)
MCHC: 29.3 g/dL — ABNORMAL LOW (ref 30.0–36.0)
MCV: 83.1 fL (ref 80.0–100.0)
MONO ABS: 0.6 10*3/uL (ref 0.1–1.0)
MONOS PCT: 10 %
NEUTROS ABS: 4.2 10*3/uL (ref 1.7–7.7)
NEUTROS PCT: 72 %
PLATELETS: 391 10*3/uL (ref 150–400)
RBC: 3.25 MIL/uL — ABNORMAL LOW (ref 3.87–5.11)
RDW: 16.5 % — ABNORMAL HIGH (ref 11.5–15.5)
WBC Count: 5.8 10*3/uL (ref 4.0–10.5)
nRBC: 0 % (ref 0.0–0.2)

## 2018-01-20 LAB — IRON AND TIBC
Iron: 23 ug/dL — ABNORMAL LOW (ref 41–142)
Saturation Ratios: 9 % — ABNORMAL LOW (ref 21–57)
TIBC: 265 ug/dL (ref 236–444)
UIBC: 242 ug/dL (ref 120–384)

## 2018-01-20 LAB — FERRITIN: Ferritin: 140 ng/mL (ref 11–307)

## 2018-01-20 LAB — CEA (IN HOUSE-CHCC): CEA (CHCC-In House): 1.39 ng/mL (ref 0.00–5.00)

## 2018-01-20 LAB — LACTATE DEHYDROGENASE: LDH: 159 U/L (ref 98–192)

## 2018-01-20 MED ORDER — FAMOTIDINE IN NACL 20-0.9 MG/50ML-% IV SOLN
INTRAVENOUS | Status: AC
  Filled 2018-01-20: qty 50

## 2018-01-20 MED ORDER — SODIUM CHLORIDE 0.9% FLUSH
10.0000 mL | INTRAVENOUS | Status: DC | PRN
  Administered 2018-01-20: 10 mL via INTRAVENOUS
  Filled 2018-01-20: qty 10

## 2018-01-20 MED ORDER — SODIUM CHLORIDE 0.9 % IV SOLN
40.0000 mg | Freq: Once | INTRAVENOUS | Status: AC
  Administered 2018-01-20: 40 mg via INTRAVENOUS
  Filled 2018-01-20: qty 4

## 2018-01-20 MED ORDER — HEPARIN SOD (PORK) LOCK FLUSH 100 UNIT/ML IV SOLN
500.0000 [IU] | Freq: Once | INTRAVENOUS | Status: AC
  Administered 2018-01-20: 500 [IU] via INTRAVENOUS
  Filled 2018-01-20: qty 5

## 2018-01-20 MED ORDER — SODIUM CHLORIDE 0.9 % IV SOLN
750.0000 mg | Freq: Once | INTRAVENOUS | Status: AC
  Administered 2018-01-20: 750 mg via INTRAVENOUS
  Filled 2018-01-20: qty 15

## 2018-01-20 MED ORDER — SODIUM CHLORIDE 0.9 % IV SOLN
Freq: Once | INTRAVENOUS | Status: AC
  Administered 2018-01-20: 10:00:00 via INTRAVENOUS
  Filled 2018-01-20: qty 250

## 2018-01-20 NOTE — Patient Instructions (Signed)
Ferric carboxymaltose injection What is this medicine? FERRIC CARBOXYMALTOSE (ferr-ik car-box-ee-mol-toes) is an iron complex. Iron is used to make healthy red blood cells, which carry oxygen and nutrients throughout the body. This medicine is used to treat anemia in people with chronic kidney disease or people who cannot take iron by mouth. This medicine may be used for other purposes; ask your health care provider or pharmacist if you have questions. COMMON BRAND NAME(S): Injectafer What should I tell my health care provider before I take this medicine? They need to know if you have any of these conditions: -anemia not caused by low iron levels -high levels of iron in the blood -liver disease -an unusual or allergic reaction to iron, other medicines, foods, dyes, or preservatives -pregnant or trying to get pregnant -breast-feeding How should I use this medicine? This medicine is for infusion into a vein. It is given by a health care professional in a hospital or clinic setting. Talk to your pediatrician regarding the use of this medicine in children. Special care may be needed. Overdosage: If you think you have taken too much of this medicine contact a poison control center or emergency room at once. NOTE: This medicine is only for you. Do not share this medicine with others. What if I miss a dose? It is important not to miss your dose. Call your doctor or health care professional if you are unable to keep an appointment. What may interact with this medicine? Do not take this medicine with any of the following medications: -deferoxamine -dimercaprol -other iron products This medicine may also interact with the following medications: -chloramphenicol -deferasirox This list may not describe all possible interactions. Give your health care provider a list of all the medicines, herbs, non-prescription drugs, or dietary supplements you use. Also tell them if you smoke, drink alcohol, or use  illegal drugs. Some items may interact with your medicine. What should I watch for while using this medicine? Visit your doctor or health care professional regularly. Tell your doctor if your symptoms do not start to get better or if they get worse. You may need blood work done while you are taking this medicine. You may need to follow a special diet. Talk to your doctor. Foods that contain iron include: whole grains/cereals, dried fruits, beans, or peas, leafy green vegetables, and organ meats (liver, kidney). What side effects may I notice from receiving this medicine? Side effects that you should report to your doctor or health care professional as soon as possible: -allergic reactions like skin rash, itching or hives, swelling of the face, lips, or tongue -breathing problems -changes in blood pressure -feeling faint or lightheaded, falls -flushing, sweating, or hot feelings Side effects that usually do not require medical attention (report to your doctor or health care professional if they continue or are bothersome): -changes in taste -constipation -dizziness -headache -nausea -pain, redness, or irritation at site where injected -vomiting This list may not describe all possible side effects. Call your doctor for medical advice about side effects. You may report side effects to FDA at 1-800-FDA-1088. Where should I keep my medicine? This drug is given in a hospital or clinic and will not be stored at home. NOTE: This sheet is a summary. It may not cover all possible information. If you have questions about this medicine, talk to your doctor, pharmacist, or health care provider.  2018 Elsevier/Gold Standard (2015-04-13 11:20:47)  

## 2018-01-20 NOTE — Telephone Encounter (Signed)
Call placed to [place] at [facility] to see if [facility] results are available.  Per Lorre Nick, results are not yet available and she believes that [facility] will fax the results directly to [place] when available.   Dr. Marin Olp notified.

## 2018-01-20 NOTE — Progress Notes (Signed)
Hematology and Oncology Follow Up Visit  Carla Mills [id number] 1970/10/28 47 y.o. 01/20/2018   Principle Diagnosis:  Recurrent colon cancer - HER2 (-)/KRAS mutant/MSI stable/MMR normal/BRAF (wt) -   Past Therapy:   FOLFOXIRI - s/p cycle #12 - completed on 11/12/2016 Status post exploratory laparotomy with HIPEC - 07/29/2016  Current Therapy: Radiation therapy with Xeloda - completed on 06/06/2017 FOLFOXIRI/Avastin - s/p cycle #8 (Avastin on hold)   Interim History:  Carla Mills is back for follow-up.  As always, she looks fantastic.  She actually had her surgery at [facility] on 12/03/2017.  She got through this incredibly well.  As always, Dr. Jyl Heinz did a fantastic job.  Looks like he resected out all that he could find.  She did have some residual disease.  The pathology report ([id number]) showed invasive moderately differentiated adenocarcinoma.  This was down in the pelvis.  I am having my staff try to get the pathology specimen and send it off for molecular analysis.  I am going to set her up with a PET scan in a couple weeks so that we can see how everything is going for her.  She is eating well.  She still has the colostomy bag.  She has had no problems with cough.  Pain is not an issue.  She is on ibuprofen 3 times a day.  She is quite anemic.  I suspect that she probably has some gastritis.  We gave her a dose of Pepcid along with some IV iron today.  I told her to take some over-the-counter Pepcid and make sure she takes the ibuprofen with food.  May be, she will take the ibuprofen twice a day instead of 3 times a day.  She has never had an elevated CEA so we really cannot follow that.  I am sure that this cancer will come back at some point if we do not do anything.  I thought that what would be a good idea might be Xeloda with Avastin.  She has had Xeloda in the past but this was for stage II colon cancer.  Again as I think that it would be reasonable to  try the Xeloda with Avastin.  She has had no leg swelling.  Overall, her performance status is ECOG 0.    Medications:  Allergies as of 01/20/2018      Reactions   Capecitabine Hives, Other (See Comments)   Legs and arms, after completing therapy   Bactrim [sulfamethoxazole-trimethoprim] Swelling, Other (See Comments)   Severe swelling   Oxaprozin Hives   Penicillins Hives, Other (See Comments)   Has patient had a PCN reaction causing immediate rash, facial/tongue/throat swelling, SOB or lightheadedness with hypotension: no Has patient had a PCN reaction causing severe rash involving mucus membranes or skin necrosis: {no Has patient had a PCN reaction that required hospitalization no Has patient had a PCN reaction occurring within the last 10 years: no If all of the above answers are "NO", then may proceed with Cephalosporin use.   Sulfonamide Derivatives Swelling, Other (See Comments)   Massive extremity swelling       Medication List        Accurate as of 01/20/18  8:35 AM. Always use your most recent med list.          dexamethasone 4 MG tablet Commonly known as:  DECADRON Take 2 tablets (8 mg total) by mouth daily. Start the day after chemo for 2 days.   fluticasone 50 MCG/ACT nasal  spray Commonly known as:  FLONASE Place 1 spray into both nostrils 2 (two) times daily.   ibuprofen 200 MG tablet Commonly known as:  ADVIL,MOTRIN Take 200 mg by mouth every 6 (six) hours as needed.   LORazepam 1 MG tablet Commonly known as:  ATIVAN Take 1 tablet (1 mg total) by mouth every 6 (six) hours as needed (NAUSEA).   morphine 15 MG tablet Commonly known as:  MSIR Take 0.5 tablets (7.5 mg total) by mouth every 4 (four) hours as needed for severe pain.   morphine 30 MG 12 hr tablet Commonly known as:  MS CONTIN Take 1 tablet (30 mg total) by mouth every 12 (twelve) hours.   ondansetron 8 MG tablet Commonly known as:  ZOFRAN Take 1 tablet (8 mg total) by mouth 2 (two)  times daily as needed for refractory nausea / vomiting. Start on day 3 after chemotherapy.   pregabalin 75 MG capsule Commonly known as:  LYRICA Take 1 capsule (75 mg total) by mouth 2 (two) times daily.   prochlorperazine 10 MG tablet Commonly known as:  COMPAZINE Take 1 tablet (10 mg total) by mouth every 6 (six) hours as needed (NAUSEA).   traMADol 50 MG tablet Commonly known as:  ULTRAM Take 1 tablet (50 mg total) by mouth every 6 (six) hours as needed.       Allergies:  Allergies  Allergen Reactions  . Capecitabine Hives and Other (See Comments)    Legs and arms, after completing therapy  . Bactrim [Sulfamethoxazole-Trimethoprim] Swelling and Other (See Comments)    Severe swelling  . Oxaprozin Hives  . Penicillins Hives and Other (See Comments)    Has patient had a PCN reaction causing immediate rash, facial/tongue/throat swelling, SOB or lightheadedness with hypotension: no Has patient had a PCN reaction causing severe rash involving mucus membranes or skin necrosis: {no Has patient had a PCN reaction that required hospitalization no Has patient had a PCN reaction occurring within the last 10 years: no If all of the above answers are "NO", then may proceed with Cephalosporin use.  . Sulfonamide Derivatives Swelling and Other (See Comments)    Massive extremity swelling     Past Medical History, Surgical history, Social history, and Family History were reviewed and updated.  Review of Systems: Review of Systems  Constitutional: Negative.   HENT: Negative.   Eyes: Negative.   Respiratory: Negative.   Cardiovascular: Negative.   Gastrointestinal: Negative.   Genitourinary: Negative.   Musculoskeletal: Negative.   Skin: Negative.   Neurological: Negative.   Endo/Heme/Allergies: Negative.   Psychiatric/Behavioral: Negative.   All other systems reviewed and are negative.   Physical Exam:  weight is 162 lb (73.5 kg). Her oral temperature is 98.3 F (36.8 C).  Her blood pressure is 126/77 and her pulse is 83. Her respiration is 16 and oxygen saturation is 100%.   Wt Readings from Last 3 Encounters:  01/20/18 162 lb (73.5 kg)  12/02/17 168 lb 1.6 oz (76.2 kg)  11/17/17 165 lb 12 oz (75.2 kg)    Physical Exam  Constitutional: She is oriented to person, place, and time.  HENT:  Head: Normocephalic and atraumatic.  Mouth/Throat: Oropharynx is clear and moist.  Eyes: Pupils are equal, round, and reactive to light. EOM are normal.  Neck: Normal range of motion.  Cardiovascular: Normal rate, regular rhythm and normal heart sounds.  Pulmonary/Chest: Effort normal and breath sounds normal.  Abdominal: Soft. Bowel sounds are normal.  She has a colostomy  in the lower abdomen.  She has the fistula which is about 3 mm in size at the caudal end of her laparotomy scar.  The laparotomy scar is well  Musculoskeletal: Normal range of motion. She exhibits no edema, tenderness or deformity.  Lymphadenopathy:    She has no cervical adenopathy.  Neurological: She is alert and oriented to person, place, and time.  Skin: Skin is warm and dry. No rash noted. No erythema.  Psychiatric: She has a normal mood and affect. Her behavior is normal. Judgment and thought content normal.  Vitals reviewed.    Lab Results  Component Value Date   WBC 5.8 01/20/2018   HGB 7.9 (L) 01/20/2018   HCT 27.0 (L) 01/20/2018   MCV 83.1 01/20/2018   PLT 391 01/20/2018   Lab Results  Component Value Date   FERRITIN 156 12/02/2017   IRON 64 12/02/2017   TIBC 290 12/02/2017   UIBC 226 12/02/2017   IRONPCTSAT 22 12/02/2017   Lab Results  Component Value Date   RETICCTPCT 0.8 02/12/2015   RBC 3.25 (L) 01/20/2018   No results found for: KPAFRELGTCHN, LAMBDASER, KAPLAMBRATIO No results found for: IGGSERUM, IGA, IGMSERUM No results found for: Kathrynn Ducking, MSPIKE, SPEI   Chemistry      Component Value Date/Time   NA 143  01/20/2018 0812   NA 145 03/27/2017 1101   NA 141 07/30/2016 0750   K 3.8 01/20/2018 0812   K 4.5 03/27/2017 1101   K 4.2 07/30/2016 0750   CL 114 (H) 01/20/2018 0812   CL 103 03/27/2017 1101   CO2 25 01/20/2018 0812   CO2 28 03/27/2017 1101   CO2 26 07/30/2016 0750   BUN 15 01/20/2018 0812   BUN 12 03/27/2017 1101   BUN 12.1 07/30/2016 0750   CREATININE 1.00 01/20/2018 0812   CREATININE 0.9 03/27/2017 1101   CREATININE 0.7 07/30/2016 0750      Component Value Date/Time   CALCIUM 9.2 01/20/2018 0812   CALCIUM 9.6 03/27/2017 1101   CALCIUM 9.5 07/30/2016 0750   ALKPHOS 87 (H) 01/20/2018 0812   ALKPHOS 101 (H) 03/27/2017 1101   ALKPHOS 72 07/30/2016 0750   AST 20 01/20/2018 0812   AST 22 07/30/2016 0750   ALT 16 01/20/2018 0812   ALT 20 03/27/2017 1101   ALT 22 07/30/2016 0750   BILITOT 0.5 01/20/2018 0812   BILITOT 0.54 07/30/2016 0750      Impression and Plan: Carla Mills is a very pleasant 47 yo caucasian female with recurrent colon cancer - HER2 (-)/KRAS (+)/MSI stable/MMR normal/BRAF -.   I know that so far, we have not had a genetic mutation that we can target.  However, I still feel that we need to send off the pathology specimen and see if we can find a mutation that we might be able to target.  We will get the PET scan set up in a couple weeks.  I we will give her a dose of iron today.  She will then come back next week for another dose of IV iron.  I would like to see her back in about 4 weeks.  By then, we should hopefully have the results back from the genetic profiling.    Volanda Napoleon, MD 10/29/20198:35 AM

## 2018-01-26 DIAGNOSIS — M9904 Segmental and somatic dysfunction of sacral region: Secondary | ICD-10-CM | POA: Diagnosis not present

## 2018-01-26 DIAGNOSIS — M62830 Muscle spasm of back: Secondary | ICD-10-CM | POA: Diagnosis not present

## 2018-01-26 DIAGNOSIS — M9903 Segmental and somatic dysfunction of lumbar region: Secondary | ICD-10-CM | POA: Diagnosis not present

## 2018-01-26 DIAGNOSIS — M545 Low back pain: Secondary | ICD-10-CM | POA: Diagnosis not present

## 2018-01-27 ENCOUNTER — Inpatient Hospital Stay: Payer: BLUE CROSS/BLUE SHIELD | Attending: Radiation Oncology

## 2018-01-27 ENCOUNTER — Other Ambulatory Visit: Payer: Self-pay | Admitting: *Deleted

## 2018-01-27 VITALS — BP 128/89 | HR 78 | Temp 98.0°F | Resp 16

## 2018-01-27 DIAGNOSIS — D509 Iron deficiency anemia, unspecified: Secondary | ICD-10-CM | POA: Insufficient documentation

## 2018-01-27 DIAGNOSIS — D500 Iron deficiency anemia secondary to blood loss (chronic): Secondary | ICD-10-CM

## 2018-01-27 MED ORDER — SODIUM CHLORIDE 0.9% FLUSH
10.0000 mL | INTRAVENOUS | Status: DC | PRN
  Filled 2018-01-27: qty 10

## 2018-01-27 MED ORDER — SODIUM CHLORIDE 0.9 % IV SOLN
750.0000 mg | Freq: Once | INTRAVENOUS | Status: AC
  Administered 2018-01-27: 750 mg via INTRAVENOUS
  Filled 2018-01-27: qty 15

## 2018-01-27 MED ORDER — IBUPROFEN-FAMOTIDINE 800-26.6 MG PO TABS: 800.0000 mg | ORAL_TABLET | Freq: Two times a day (BID) | ORAL | 2 refills | Status: DC | PRN

## 2018-01-27 MED ORDER — SODIUM CHLORIDE 0.9 % IV SOLN
Freq: Once | INTRAVENOUS | Status: AC
  Administered 2018-01-27: 11:00:00 via INTRAVENOUS
  Filled 2018-01-27: qty 250

## 2018-01-27 MED ORDER — HEPARIN SOD (PORK) LOCK FLUSH 100 UNIT/ML IV SOLN
500.0000 [IU] | Freq: Once | INTRAVENOUS | Status: DC
  Filled 2018-01-27: qty 5

## 2018-01-27 MED FILL — DUEXIS 800-26.6 MG TABLET: 800-26.6 | 45 days supply | Qty: 90 | Fill #0

## 2018-01-27 NOTE — Patient Instructions (Signed)
Ferric carboxymaltose injection What is this medicine? FERRIC CARBOXYMALTOSE (ferr-ik car-box-ee-mol-toes) is an iron complex. Iron is used to make healthy red blood cells, which carry oxygen and nutrients throughout the body. This medicine is used to treat anemia in people with chronic kidney disease or people who cannot take iron by mouth. This medicine may be used for other purposes; ask your health care provider or pharmacist if you have questions. COMMON BRAND NAME(S): Injectafer What should I tell my health care provider before I take this medicine? They need to know if you have any of these conditions: -anemia not caused by low iron levels -high levels of iron in the blood -liver disease -an unusual or allergic reaction to iron, other medicines, foods, dyes, or preservatives -pregnant or trying to get pregnant -breast-feeding How should I use this medicine? This medicine is for infusion into a vein. It is given by a health care professional in a hospital or clinic setting. Talk to your pediatrician regarding the use of this medicine in children. Special care may be needed. Overdosage: If you think you have taken too much of this medicine contact a poison control center or emergency room at once. NOTE: This medicine is only for you. Do not share this medicine with others. What if I miss a dose? It is important not to miss your dose. Call your doctor or health care professional if you are unable to keep an appointment. What may interact with this medicine? Do not take this medicine with any of the following medications: -deferoxamine -dimercaprol -other iron products This medicine may also interact with the following medications: -chloramphenicol -deferasirox This list may not describe all possible interactions. Give your health care provider a list of all the medicines, herbs, non-prescription drugs, or dietary supplements you use. Also tell them if you smoke, drink alcohol, or use  illegal drugs. Some items may interact with your medicine. What should I watch for while using this medicine? Visit your doctor or health care professional regularly. Tell your doctor if your symptoms do not start to get better or if they get worse. You may need blood work done while you are taking this medicine. You may need to follow a special diet. Talk to your doctor. Foods that contain iron include: whole grains/cereals, dried fruits, beans, or peas, leafy green vegetables, and organ meats (liver, kidney). What side effects may I notice from receiving this medicine? Side effects that you should report to your doctor or health care professional as soon as possible: -allergic reactions like skin rash, itching or hives, swelling of the face, lips, or tongue -breathing problems -changes in blood pressure -feeling faint or lightheaded, falls -flushing, sweating, or hot feelings Side effects that usually do not require medical attention (report to your doctor or health care professional if they continue or are bothersome): -changes in taste -constipation -dizziness -headache -nausea -pain, redness, or irritation at site where injected -vomiting This list may not describe all possible side effects. Call your doctor for medical advice about side effects. You may report side effects to FDA at 1-800-FDA-1088. Where should I keep my medicine? This drug is given in a hospital or clinic and will not be stored at home. NOTE: This sheet is a summary. It may not cover all possible information. If you have questions about this medicine, talk to your doctor, pharmacist, or health care provider.  2018 Elsevier/Gold Standard (2015-04-13 11:20:47)  

## 2018-01-29 ENCOUNTER — Telehealth: Payer: Self-pay | Admitting: Hematology & Oncology

## 2018-01-29 NOTE — Telephone Encounter (Signed)
Call pt to inform her that PET scan scheduled for 11/11 ahs been cancelled due to denial from her  insurance company. Pt aware that we will resch appt once appeal process is successful.

## 2018-02-02 ENCOUNTER — Ambulatory Visit (HOSPITAL_COMMUNITY): Payer: BLUE CROSS/BLUE SHIELD

## 2018-02-02 DIAGNOSIS — M62830 Muscle spasm of back: Secondary | ICD-10-CM | POA: Diagnosis not present

## 2018-02-02 DIAGNOSIS — M9904 Segmental and somatic dysfunction of sacral region: Secondary | ICD-10-CM | POA: Diagnosis not present

## 2018-02-02 DIAGNOSIS — M545 Low back pain: Secondary | ICD-10-CM | POA: Diagnosis not present

## 2018-02-02 DIAGNOSIS — M9903 Segmental and somatic dysfunction of lumbar region: Secondary | ICD-10-CM | POA: Diagnosis not present

## 2018-02-03 ENCOUNTER — Encounter: Payer: Self-pay | Admitting: Family

## 2018-02-05 DIAGNOSIS — Z933 Colostomy status: Secondary | ICD-10-CM | POA: Diagnosis not present

## 2018-02-05 DIAGNOSIS — Z85038 Personal history of other malignant neoplasm of large intestine: Secondary | ICD-10-CM | POA: Diagnosis not present

## 2018-02-12 ENCOUNTER — Other Ambulatory Visit: Payer: Self-pay | Admitting: Family

## 2018-02-16 ENCOUNTER — Encounter (HOSPITAL_COMMUNITY)
Admission: RE | Admit: 2018-02-16 | Discharge: 2018-02-16 | Disposition: A | Discharge status: Home / Self Care | Payer: BLUE CROSS/BLUE SHIELD | Source: Ambulatory Visit | Attending: Hematology & Oncology | Admitting: Hematology & Oncology

## 2018-02-16 DIAGNOSIS — M9904 Segmental and somatic dysfunction of sacral region: Secondary | ICD-10-CM | POA: Diagnosis not present

## 2018-02-16 DIAGNOSIS — C20 Malignant neoplasm of rectum: Secondary | ICD-10-CM | POA: Diagnosis not present

## 2018-02-16 DIAGNOSIS — M62830 Muscle spasm of back: Secondary | ICD-10-CM | POA: Diagnosis not present

## 2018-02-16 DIAGNOSIS — C189 Malignant neoplasm of colon, unspecified: Secondary | ICD-10-CM | POA: Diagnosis not present

## 2018-02-16 DIAGNOSIS — M9903 Segmental and somatic dysfunction of lumbar region: Secondary | ICD-10-CM | POA: Diagnosis not present

## 2018-02-16 DIAGNOSIS — M545 Low back pain: Secondary | ICD-10-CM | POA: Diagnosis not present

## 2018-02-16 LAB — GLUCOSE, CAPILLARY: Glucose-Capillary: 83 mg/dL (ref 70–99)

## 2018-02-16 MED ORDER — FLUDEOXYGLUCOSE F - 18 (FDG) INJECTION
8.0000 | Freq: Once | INTRAVENOUS | Status: AC | PRN
  Administered 2018-02-16: 8 via INTRAVENOUS

## 2018-02-18 ENCOUNTER — Telehealth: Payer: Self-pay | Admitting: Hematology & Oncology

## 2018-02-18 ENCOUNTER — Other Ambulatory Visit: Payer: Self-pay | Admitting: Hematology & Oncology

## 2018-02-18 DIAGNOSIS — C189 Malignant neoplasm of colon, unspecified: Secondary | ICD-10-CM

## 2018-02-18 NOTE — Telephone Encounter (Signed)
Spoke with patient and appt scheduled for 11/29 @ 11:30 per 11/27 sch message

## 2018-02-20 ENCOUNTER — Inpatient Hospital Stay: Payer: BLUE CROSS/BLUE SHIELD

## 2018-02-20 ENCOUNTER — Encounter: Payer: Self-pay | Admitting: Hematology & Oncology

## 2018-02-20 VITALS — BP 119/76 | HR 71 | Temp 97.6°F | Resp 18

## 2018-02-20 DIAGNOSIS — C189 Malignant neoplasm of colon, unspecified: Secondary | ICD-10-CM

## 2018-02-20 DIAGNOSIS — C772 Secondary and unspecified malignant neoplasm of intra-abdominal lymph nodes: Principal | ICD-10-CM

## 2018-02-20 DIAGNOSIS — D509 Iron deficiency anemia, unspecified: Secondary | ICD-10-CM | POA: Diagnosis not present

## 2018-02-20 DIAGNOSIS — C187 Malignant neoplasm of sigmoid colon: Secondary | ICD-10-CM

## 2018-02-20 LAB — CBC WITH DIFFERENTIAL (CANCER CENTER ONLY)
Abs Immature Granulocytes: 0.02 10*3/uL (ref 0.00–0.07)
BASOS PCT: 1 %
Basophils Absolute: 0 10*3/uL (ref 0.0–0.1)
EOS PCT: 5 %
Eosinophils Absolute: 0.3 10*3/uL (ref 0.0–0.5)
HEMATOCRIT: 34.7 % — AB (ref 36.0–46.0)
Hemoglobin: 10.7 g/dL — ABNORMAL LOW (ref 12.0–15.0)
Immature Granulocytes: 0 %
Lymphocytes Relative: 13 %
Lymphs Abs: 0.8 10*3/uL (ref 0.7–4.0)
MCH: 26.8 pg (ref 26.0–34.0)
MCHC: 30.8 g/dL (ref 30.0–36.0)
MCV: 87 fL (ref 80.0–100.0)
MONO ABS: 0.4 10*3/uL (ref 0.1–1.0)
MONOS PCT: 6 %
NEUTROS PCT: 75 %
Neutro Abs: 4.9 10*3/uL (ref 1.7–7.7)
PLATELETS: 297 10*3/uL (ref 150–400)
RBC: 3.99 MIL/uL (ref 3.87–5.11)
RDW: 18.9 % — AB (ref 11.5–15.5)
WBC Count: 6.4 10*3/uL (ref 4.0–10.5)
nRBC: 0 % (ref 0.0–0.2)

## 2018-02-20 LAB — CMP (CANCER CENTER ONLY)
ALBUMIN: 4.2 g/dL (ref 3.5–5.0)
ALK PHOS: 79 U/L (ref 38–126)
ALT: 9 U/L (ref 0–44)
ANION GAP: 9 (ref 5–15)
AST: 15 U/L (ref 15–41)
BILIRUBIN TOTAL: 0.3 mg/dL (ref 0.3–1.2)
BUN: 18 mg/dL (ref 6–20)
CO2: 26 mmol/L (ref 22–32)
Calcium: 9.1 mg/dL (ref 8.9–10.3)
Chloride: 106 mmol/L (ref 98–111)
Creatinine: 0.83 mg/dL (ref 0.44–1.00)
GFR, Est AFR Am: >60 mL/min (ref >60)
GFR, Estimated: >60 mL/min (ref >60)
GLUCOSE: 89 mg/dL (ref 70–99)
Potassium: 4.4 mmol/L (ref 3.5–5.1)
Sodium: 141 mmol/L (ref 135–145)
TOTAL PROTEIN: 6.6 g/dL (ref 6.5–8.1)

## 2018-02-20 LAB — URIC ACID: Uric Acid, Serum: 4.7 mg/dL (ref 2.5–7.1)

## 2018-02-20 LAB — MAGNESIUM: Magnesium: 1.9 mg/dL (ref 1.7–2.4)

## 2018-02-20 MED ORDER — SODIUM CHLORIDE 0.9% FLUSH
10.0000 mL | INTRAVENOUS | Status: DC | PRN
  Administered 2018-02-20: 10 mL via INTRAVENOUS
  Filled 2018-02-20: qty 10

## 2018-02-20 MED ORDER — HEPARIN SOD (PORK) LOCK FLUSH 100 UNIT/ML IV SOLN
500.0000 [IU] | Freq: Once | INTRAVENOUS | Status: AC
  Administered 2018-02-20: 500 [IU] via INTRAVENOUS
  Filled 2018-02-20: qty 5

## 2018-02-20 NOTE — Patient Instructions (Signed)
Implanted Port Home Guide An implanted port is a type of central line that is placed under the skin. Central lines are used to provide IV access when treatment or nutrition needs to be given through a person's veins. Implanted ports are used for long-term IV access. An implanted port may be placed because:  You need IV medicine that would be irritating to the small veins in your hands or arms.  You need long-term IV medicines, such as antibiotics.  You need IV nutrition for a long period.  You need frequent blood draws for lab tests.  You need dialysis.  Implanted ports are usually placed in the chest area, but they can also be placed in the upper arm, the abdomen, or the leg. An implanted port has two main parts:  Reservoir. The reservoir is round and will appear as a small, raised area under your skin. The reservoir is the part where a needle is inserted to give medicines or draw blood.  Catheter. The catheter is a thin, flexible tube that extends from the reservoir. The catheter is placed into a large vein. Medicine that is inserted into the reservoir goes into the catheter and then into the vein.  How will I care for my incision site? Do not get the incision site wet. Bathe or shower as directed by your health care provider. How is my port accessed? Special steps must be taken to access the port:  Before the port is accessed, a numbing cream can be placed on the skin. This helps numb the skin over the port site.  Your health care provider uses a sterile technique to access the port. ? Your health care provider must put on a mask and sterile gloves. ? The skin over your port is cleaned carefully with an antiseptic and allowed to dry. ? The port is gently pinched between sterile gloves, and a needle is inserted into the port.  Only "non-coring" port needles should be used to access the port. Once the port is accessed, a blood return should be checked. This helps ensure that the port  is in the vein and is not clogged.  If your port needs to remain accessed for a constant infusion, a clear (transparent) bandage will be placed over the needle site. The bandage and needle will need to be changed every week, or as directed by your health care provider.  Keep the bandage covering the needle clean and dry. Do not get it wet. Follow your health care provider's instructions on how to take a shower or bath while the port is accessed.  If your port does not need to stay accessed, no bandage is needed over the port.  What is flushing? Flushing helps keep the port from getting clogged. Follow your health care provider's instructions on how and when to flush the port. Ports are usually flushed with saline solution or a medicine called heparin. The need for flushing will depend on how the port is used.  If the port is used for intermittent medicines or blood draws, the port will need to be flushed: ? After medicines have been given. ? After blood has been drawn. ? As part of routine maintenance.  If a constant infusion is running, the port may not need to be flushed.  How long will my port stay implanted? The port can stay in for as long as your health care provider thinks it is needed. When it is time for the port to come out, surgery will be   done to remove it. The procedure is similar to the one performed when the port was put in. When should I seek immediate medical care? When you have an implanted port, you should seek immediate medical care if:  You notice a bad smell coming from the incision site.  You have swelling, redness, or drainage at the incision site.  You have more swelling or pain at the port site or the surrounding area.  You have a fever that is not controlled with medicine.  This information is not intended to replace advice given to you by your health care provider. Make sure you discuss any questions you have with your health care provider. Document  Released: 03/11/2005 Document Revised: 08/17/2015 Document Reviewed: 11/16/2012 Elsevier Interactive Patient Education  2017 [employer]  

## 2018-03-02 ENCOUNTER — Encounter: Payer: Self-pay | Admitting: Hematology & Oncology

## 2018-03-02 ENCOUNTER — Inpatient Hospital Stay: Payer: BLUE CROSS/BLUE SHIELD

## 2018-03-02 ENCOUNTER — Other Ambulatory Visit: Payer: Self-pay

## 2018-03-02 ENCOUNTER — Inpatient Hospital Stay: Payer: BLUE CROSS/BLUE SHIELD | Attending: Radiation Oncology | Admitting: Hematology & Oncology

## 2018-03-02 VITALS — BP 114/72 | HR 81 | Temp 97.7°F | Resp 18 | Ht 65.0 in | Wt 161.8 lb

## 2018-03-02 DIAGNOSIS — C189 Malignant neoplasm of colon, unspecified: Secondary | ICD-10-CM

## 2018-03-02 DIAGNOSIS — Z9221 Personal history of antineoplastic chemotherapy: Secondary | ICD-10-CM | POA: Diagnosis not present

## 2018-03-02 DIAGNOSIS — C772 Secondary and unspecified malignant neoplasm of intra-abdominal lymph nodes: Secondary | ICD-10-CM

## 2018-03-02 DIAGNOSIS — N343 Urethral syndrome, unspecified: Secondary | ICD-10-CM

## 2018-03-02 DIAGNOSIS — C187 Malignant neoplasm of sigmoid colon: Secondary | ICD-10-CM

## 2018-03-02 DIAGNOSIS — D500 Iron deficiency anemia secondary to blood loss (chronic): Secondary | ICD-10-CM

## 2018-03-02 DIAGNOSIS — Z923 Personal history of irradiation: Secondary | ICD-10-CM

## 2018-03-02 LAB — CBC WITH DIFFERENTIAL (CANCER CENTER ONLY)
ABS IMMATURE GRANULOCYTES: 0.03 10*3/uL (ref 0.00–0.07)
Basophils Absolute: 0 10*3/uL (ref 0.0–0.1)
Basophils Relative: 0 %
Eosinophils Absolute: 0.4 10*3/uL (ref 0.0–0.5)
Eosinophils Relative: 6 %
HCT: 34.5 % — ABNORMAL LOW (ref 36.0–46.0)
Hemoglobin: 10.5 g/dL — ABNORMAL LOW (ref 12.0–15.0)
Immature Granulocytes: 1 %
Lymphocytes Relative: 13 %
Lymphs Abs: 0.7 10*3/uL (ref 0.7–4.0)
MCH: 26.9 pg (ref 26.0–34.0)
MCHC: 30.4 g/dL (ref 30.0–36.0)
MCV: 88.5 fL (ref 80.0–100.0)
MONO ABS: 0.5 10*3/uL (ref 0.1–1.0)
Monocytes Relative: 8 %
NEUTROS ABS: 4 10*3/uL (ref 1.7–7.7)
Neutrophils Relative %: 72 %
PLATELETS: 274 10*3/uL (ref 150–400)
RBC: 3.9 MIL/uL (ref 3.87–5.11)
RDW: 18.6 % — ABNORMAL HIGH (ref 11.5–15.5)
WBC Count: 5.6 10*3/uL (ref 4.0–10.5)
nRBC: 0 % (ref 0.0–0.2)

## 2018-03-02 LAB — URINALYSIS, COMPLETE (UACMP) WITH MICROSCOPIC
BILIRUBIN URINE: NEGATIVE
Glucose, UA: NEGATIVE mg/dL
Ketones, ur: NEGATIVE mg/dL
Nitrite: NEGATIVE
Protein, ur: NEGATIVE mg/dL
Specific Gravity, Urine: 1.025 (ref 1.005–1.030)
WBC, UA: >50 WBC/hpf (ref 0–5)
pH: 6 (ref 5.0–8.0)

## 2018-03-02 LAB — IRON AND TIBC
Iron: 47 ug/dL (ref 41–142)
SATURATION RATIOS: 21 % (ref 21–57)
TIBC: 230 ug/dL — ABNORMAL LOW (ref 236–444)
UIBC: 183 ug/dL (ref 120–384)

## 2018-03-02 LAB — CMP (CANCER CENTER ONLY)
ALT: 9 U/L (ref 0–44)
AST: 11 U/L — ABNORMAL LOW (ref 15–41)
Albumin: 3.9 g/dL (ref 3.5–5.0)
Alkaline Phosphatase: 76 U/L (ref 38–126)
Anion gap: 9 (ref 5–15)
BUN: 17 mg/dL (ref 6–20)
CHLORIDE: 108 mmol/L (ref 98–111)
CO2: 25 mmol/L (ref 22–32)
Calcium: 9.1 mg/dL (ref 8.9–10.3)
Creatinine: 0.82 mg/dL (ref 0.44–1.00)
GFR, Est AFR Am: >60 mL/min (ref >60)
GFR, Estimated: >60 mL/min (ref >60)
Glucose, Bld: 119 mg/dL — ABNORMAL HIGH (ref 70–99)
POTASSIUM: 3.7 mmol/L (ref 3.5–5.1)
Sodium: 142 mmol/L (ref 135–145)
Total Bilirubin: 0.3 mg/dL (ref 0.3–1.2)
Total Protein: 6.2 g/dL — ABNORMAL LOW (ref 6.5–8.1)

## 2018-03-02 LAB — CEA (IN HOUSE-CHCC): CEA (CHCC-In House): 2.21 ng/mL (ref 0.00–5.00)

## 2018-03-02 LAB — LACTATE DEHYDROGENASE: LDH: 153 U/L (ref 98–192)

## 2018-03-02 LAB — FERRITIN: Ferritin: 451 ng/mL — ABNORMAL HIGH (ref 11–307)

## 2018-03-02 MED ORDER — LEVOFLOXACIN 500 MG PO TABS: 500.0000 mg | ORAL_TABLET | Freq: Every day | ORAL | 0 refills | Status: DC

## 2018-03-02 NOTE — Progress Notes (Addendum)
Hematology and Oncology Follow Up Visit  Carla Mills [id number] 04/17/1970 47 y.o. 03/02/2018   Principle Diagnosis:  Recurrent colon cancer - HER2 (-)/KRAS mutant/MSI stable/MMR normal/BRAF (wt) -   Past Therapy:   FOLFOXIRI - s/p cycle #12 - completed on 11/12/2016 Status post exploratory laparotomy with HIPEC - 07/29/2016  Current Therapy: Radiation therapy with Xeloda - completed on 06/06/2017 FOLFOXIRI/Avastin - s/p cycle #8 (Avastin on hold)   Interim History:  Carla Mills is back for follow-up.  She looks quite good.  She has been doing well since we last saw her.  I think we last saw her back in late October.  As always, we did send off molecular profiling on her tumor that was removed back in October.  Unfortunately, there is still nothing that we could find that we could target.  She was negative for PD-L1.  She had no molecular markers that we could target.  She was BRAF wild-type.  We did do a PET scan on her.  This was done on 02/16/2018.  The PET scan showed some hypermetabolic rectal wall thickening with a fluid air collection.  Also noted was severe right hydronephrosis.  There was a possibility of a 8 mm common iliac lymph node on the left.  She will see urology on Friday for this right hydronephrosis.  Hopefully, this is not indicative of any type of tumor recurrence.  I would not think so given that her CEA has been okay.  She and her family had a nice Thanksgiving.  They will have a nice Christmas.  I thought about treatment for her with Xeloda/Avastin.  I think this would be a good "maintenance" protocol for her.  She has been on Xeloda before and has had no problems.  I think this would be reasonable as she does not need a pump to walk around with.  Again, I do not want to start anything until we know what urology has to do.  If we give her Avastin, that would delay any surgical procedure for a month.  She has had no issues with fever.  She has had no cough.   She has had no abdominal pain.  There is been some issues with her urine.  We did do a urinalysis on her today.  The urine seem to be infected.  As such, I probably will get her ciprofloxacin or levofloxacin.  Since she has a Port-A-Cath in, she will need to be on some type of antibiotic coverage for any type of urologic intervention.  Overall, her performance status is ECOG 0.     Medications:  Allergies as of 03/02/2018      Reactions   Capecitabine Hives, Other (See Comments)   Legs and arms, after completing therapy   Bactrim [sulfamethoxazole-trimethoprim] Swelling, Other (See Comments)   Severe swelling   Oxaprozin Hives   Penicillins Hives, Other (See Comments)   Has patient had a PCN reaction causing immediate rash, facial/tongue/throat swelling, SOB or lightheadedness with hypotension: no Has patient had a PCN reaction causing severe rash involving mucus membranes or skin necrosis: {no Has patient had a PCN reaction that required hospitalization no Has patient had a PCN reaction occurring within the last 10 years: no If all of the above answers are "NO", then may proceed with Cephalosporin use.   Sulfonamide Derivatives Swelling, Other (See Comments)   Massive extremity swelling       Medication List        Accurate as of 03/02/18  6:41 PM. Always use your most recent med list.          dexamethasone 4 MG tablet Commonly known as:  DECADRON Take 2 tablets (8 mg total) by mouth daily. Start the day after chemo for 2 days.   enoxaparin 40 MG/0.4ML injection Commonly known as:  LOVENOX   fluticasone 50 MCG/ACT nasal spray Commonly known as:  FLONASE Place 1 spray into both nostrils 2 (two) times daily.   ibuprofen 200 MG tablet Commonly known as:  ADVIL,MOTRIN Take 200 mg by mouth every 6 (six) hours as needed.   Ibuprofen-Famotidine 800-26.6 MG Tabs Take 800 mg by mouth 2 (two) times daily as needed.   LORazepam 1 MG tablet Commonly known as:  ATIVAN Take 1  tablet (1 mg total) by mouth every 6 (six) hours as needed (NAUSEA).   morphine 15 MG tablet Commonly known as:  MSIR Take 0.5 tablets (7.5 mg total) by mouth every 4 (four) hours as needed for severe pain.   morphine 30 MG 12 hr tablet Commonly known as:  MS CONTIN Take 1 tablet (30 mg total) by mouth every 12 (twelve) hours.   ondansetron 8 MG tablet Commonly known as:  ZOFRAN Take 1 tablet (8 mg total) by mouth 2 (two) times daily as needed for refractory nausea / vomiting. Start on day 3 after chemotherapy.   pregabalin 75 MG capsule Commonly known as:  LYRICA Take 1 capsule (75 mg total) by mouth 2 (two) times daily.   prochlorperazine 10 MG tablet Commonly known as:  COMPAZINE Take 1 tablet (10 mg total) by mouth every 6 (six) hours as needed (NAUSEA).   traMADol 50 MG tablet Commonly known as:  ULTRAM Take 1 tablet (50 mg total) by mouth every 6 (six) hours as needed.       Allergies:  Allergies  Allergen Reactions  . Capecitabine Hives and Other (See Comments)    Legs and arms, after completing therapy  . Bactrim [Sulfamethoxazole-Trimethoprim] Swelling and Other (See Comments)    Severe swelling  . Oxaprozin Hives  . Penicillins Hives and Other (See Comments)    Has patient had a PCN reaction causing immediate rash, facial/tongue/throat swelling, SOB or lightheadedness with hypotension: no Has patient had a PCN reaction causing severe rash involving mucus membranes or skin necrosis: {no Has patient had a PCN reaction that required hospitalization no Has patient had a PCN reaction occurring within the last 10 years: no If all of the above answers are "NO", then may proceed with Cephalosporin use.  . Sulfonamide Derivatives Swelling and Other (See Comments)    Massive extremity swelling     Past Medical History, Surgical history, Social history, and Family History were reviewed and updated.  Review of Systems: Review of Systems  Constitutional: Negative.     HENT: Negative.   Eyes: Negative.   Respiratory: Negative.   Cardiovascular: Negative.   Gastrointestinal: Negative.   Genitourinary: Negative.   Musculoskeletal: Negative.   Skin: Negative.   Neurological: Negative.   Endo/Heme/Allergies: Negative.   Psychiatric/Behavioral: Negative.   All other systems reviewed and are negative.   Physical Exam:  height is _0  (1.651 m) and weight is 161 lb 12 oz (73.4 kg). Her oral temperature is 97.7 F (36.5 C). Her blood pressure is 114/72 and her pulse is 81. Her respiration is 18 and oxygen saturation is 100%.   Wt Readings from Last 3 Encounters:  03/02/18 161 lb 12 oz (73.4 kg)  01/20/18 162 lb (  73.5 kg)  12/02/17 168 lb 1.6 oz (76.2 kg)    Physical Exam  Constitutional: She is oriented to person, place, and time.  HENT:  Head: Normocephalic and atraumatic.  Mouth/Throat: Oropharynx is clear and moist.  Eyes: Pupils are equal, round, and reactive to light. EOM are normal.  Neck: Normal range of motion.  Cardiovascular: Normal rate, regular rhythm and normal heart sounds.  Pulmonary/Chest: Effort normal and breath sounds normal.  Abdominal: Soft. Bowel sounds are normal.  She has a colostomy in the lower abdomen.  She has the fistula which is about 3 mm in size at the caudal end of her laparotomy scar.  The laparotomy scar is well  Musculoskeletal: Normal range of motion. She exhibits no edema, tenderness or deformity.  Lymphadenopathy:    She has no cervical adenopathy.  Neurological: She is alert and oriented to person, place, and time.  Skin: Skin is warm and dry. No rash noted. No erythema.  Psychiatric: She has a normal mood and affect. Her behavior is normal. Judgment and thought content normal.  Vitals reviewed.    Lab Results  Component Value Date   WBC 5.6 03/02/2018   HGB 10.5 (L) 03/02/2018   HCT 34.5 (L) 03/02/2018   MCV 88.5 03/02/2018   PLT 274 03/02/2018   Lab Results  Component Value Date   FERRITIN  451 (H) 03/02/2018   IRON 47 03/02/2018   TIBC 230 (L) 03/02/2018   UIBC 183 03/02/2018   IRONPCTSAT 21 03/02/2018   Lab Results  Component Value Date   RETICCTPCT 0.8 02/12/2015   RBC 3.90 03/02/2018   No results found for: KPAFRELGTCHN, LAMBDASER, KAPLAMBRATIO No results found for: IGGSERUM, IGA, IGMSERUM No results found for: Kathrynn Ducking, MSPIKE, SPEI   Chemistry      Component Value Date/Time   NA 142 03/02/2018 0800   NA 145 03/27/2017 1101   NA 141 07/30/2016 0750   K 3.7 03/02/2018 0800   K 4.5 03/27/2017 1101   K 4.2 07/30/2016 0750   CL 108 03/02/2018 0800   CL 103 03/27/2017 1101   CO2 25 03/02/2018 0800   CO2 28 03/27/2017 1101   CO2 26 07/30/2016 0750   BUN 17 03/02/2018 0800   BUN 12 03/27/2017 1101   BUN 12.1 07/30/2016 0750   CREATININE 0.82 03/02/2018 0800   CREATININE 0.9 03/27/2017 1101   CREATININE 0.7 07/30/2016 0750      Component Value Date/Time   CALCIUM 9.1 03/02/2018 0800   CALCIUM 9.6 03/27/2017 1101   CALCIUM 9.5 07/30/2016 0750   ALKPHOS 76 03/02/2018 0800   ALKPHOS 101 (H) 03/27/2017 1101   ALKPHOS 72 07/30/2016 0750   AST 11 (L) 03/02/2018 0800   AST 22 07/30/2016 0750   ALT 9 03/02/2018 0800   ALT 20 03/27/2017 1101   ALT 22 07/30/2016 0750   BILITOT 0.3 03/02/2018 0800   BILITOT 0.54 07/30/2016 0750      Impression and Plan: Ms. Schrupp is a very pleasant 47 yo caucasian female with recurrent colon cancer - HER2 (-)/KRAS (+)/MSI stable/MMR normal/BRAF -.   Of note, her iron studies today showed a ferritin of 451 with iron saturation of 21%.  I do not think we need to give her any IV iron.  We will plan to get her back right after the holidays.  By then, we will know what urology will do or has already done.  For right now, we discussed what our  goal of care is.  Our goal of care clearly is to decrease the risk of this cancer coming back again.  Our goal is to maintain her quality  of life is much as possible.  She has the 3 young daughters.  I really want her to be able to be active in their lives.  I think that with therapy with Xeloda/Avastin, we will be able to accomplish this.  I am just glad that she is such a tough woman.  She really has a very strong constitution.  She has been through quite a lot with respect to her malignancy and continues to show somewhat strength.  She is a role model for her 3 daughters.    Volanda Napoleon, MD 12/9/20196:41 PM

## 2018-03-02 NOTE — Patient Instructions (Signed)
Implanted Port Insertion, Care After °This sheet gives you information about how to care for yourself after your procedure. Your health care provider may also give you more specific instructions. If you have problems or questions, contact your health care provider. °What can I expect after the procedure? °After your procedure, it is common to have: °· Discomfort at the port insertion site. °· Bruising on the skin over the port. This should improve over 3-4 days. ° °Follow these instructions at home: °Port care °· After your port is placed, you will get a manufacturer's information card. The card has information about your port. Keep this card with you at all times. °· Take care of the port as told by your health care provider. Ask your health care provider if you or a family member can get training for taking care of the port at home. A home health care nurse may also take care of the port. °· Make sure to remember what type of port you have. °Incision care °· Follow instructions from your health care provider about how to take care of your port insertion site. Make sure you: °? Wash your hands with soap and water before you change your bandage (dressing). If soap and water are not available, use hand sanitizer. °? Change your dressing as told by your health care provider. °? Leave stitches (sutures), skin glue, or adhesive strips in place. These skin closures may need to stay in place for 2 weeks or longer. If adhesive strip edges start to loosen and curl up, you may trim the loose edges. Do not remove adhesive strips completely unless your health care provider tells you to do that. °· Check your port insertion site every day for signs of infection. Check for: °? More redness, swelling, or pain. °? More fluid or blood. °? Warmth. °? Pus or a bad smell. °General instructions °· Do not take baths, swim, or use a hot tub until your health care provider approves. °· Do not lift anything that is heavier than 10 lb (4.5  kg) for a week, or as told by your health care provider. °· Ask your health care provider when it is okay to: °? Return to work or school. °? Resume usual physical activities or sports. °· Do not drive for 24 hours if you were given a medicine to help you relax (sedative). °· Take over-the-counter and prescription medicines only as told by your health care provider. °· Wear a medical alert bracelet in case of an emergency. This will tell any health care providers that you have a port. °· Keep all follow-up visits as told by your health care provider. This is important. °Contact a health care provider if: °· You cannot flush your port with saline as directed, or you cannot draw blood from the port. °· You have a fever or chills. °· You have more redness, swelling, or pain around your port insertion site. °· You have more fluid or blood coming from your port insertion site. °· Your port insertion site feels warm to the touch. °· You have pus or a bad smell coming from the port insertion site. °Get help right away if: °· You have chest pain or shortness of breath. °· You have bleeding from your port that you cannot control. °Summary °· Take care of the port as told by your health care provider. °· Change your dressing as told by your health care provider. °· Keep all follow-up visits as told by your health care provider. °  This information is not intended to replace advice given to you by your health care provider. Make sure you discuss any questions you have with your health care provider. °Document Released: 12/30/2012 Document Revised: 01/31/2016 Document Reviewed: 01/31/2016 °Elsevier Interactive Patient Education © 2017 [employer] ° °

## 2018-03-03 LAB — URINE CULTURE

## 2018-03-03 MED FILL — levoFLOXacin 500 MG TABS: 500 | 10 days supply | Qty: 10 | Fill #0

## 2018-03-03 NOTE — Progress Notes (Signed)
DISCONTINUE OFF PATHWAY REGIMEN - Colorectal   [id number]:FOLFOXIRI q14 days **2 cycles per order sheet**:   A cycle is every 14 days:     Irinotecan        Dose Mod: None     Oxaliplatin        Dose Mod: None     Leucovorin        Dose Mod: None     5-Fluorouracil        Dose Mod: None  **Always confirm dose/schedule in your pharmacy ordering system**  REASON: Other Reason PRIOR TREATMENT: Off Pathway: FOLFOXIRI q14 days **2 cycles per order sheet** TREATMENT RESPONSE: Partial Response (PR)  START OFF PATHWAY REGIMEN - Colorectal   [id number]:Capecitabine + Bevacizumab (1250/7.5) q21 Days:   A cycle is every 21 days:     Capecitabine      Bevacizumab-xxxx   **Always confirm dose/schedule in your pharmacy ordering system**  Patient Characteristics: Post-Neoadjuvant Therapy and Resection (Neoadjuvant Pathologic Staging), Distant Metastasis Therapeutic Status: Post-Neoadjuvant Therapy and Resection (Neoadjuvant Pathologic Staging) Tumor Location: Colon AJCC M Category: pM1c AJCC T Category: ypTX AJCC N Category: ypNX AJCC 8 Stage Grouping: IVC Intent of Therapy: Non-Curative / Palliative Intent, Discussed with Patient

## 2018-03-06 DIAGNOSIS — N131 Hydronephrosis with ureteral stricture, not elsewhere classified: Secondary | ICD-10-CM | POA: Diagnosis not present

## 2018-03-06 DIAGNOSIS — R3915 Urgency of urination: Secondary | ICD-10-CM | POA: Diagnosis not present

## 2018-03-06 DIAGNOSIS — R350 Frequency of micturition: Secondary | ICD-10-CM | POA: Diagnosis not present

## 2018-03-09 ENCOUNTER — Other Ambulatory Visit: Payer: Self-pay | Admitting: Urology

## 2018-03-09 DIAGNOSIS — M9903 Segmental and somatic dysfunction of lumbar region: Secondary | ICD-10-CM | POA: Diagnosis not present

## 2018-03-09 DIAGNOSIS — M62830 Muscle spasm of back: Secondary | ICD-10-CM | POA: Diagnosis not present

## 2018-03-09 DIAGNOSIS — M9904 Segmental and somatic dysfunction of sacral region: Secondary | ICD-10-CM | POA: Diagnosis not present

## 2018-03-09 DIAGNOSIS — M545 Low back pain: Secondary | ICD-10-CM | POA: Diagnosis not present

## 2018-03-10 ENCOUNTER — Encounter (HOSPITAL_BASED_OUTPATIENT_CLINIC_OR_DEPARTMENT_OTHER): Payer: Self-pay | Admitting: *Deleted

## 2018-03-10 ENCOUNTER — Other Ambulatory Visit: Payer: Self-pay

## 2018-03-10 NOTE — Progress Notes (Signed)
Spoke with Vedanshi Npo after midnight, arrive 530 am 03-16-18 [facility] meds to take : flonase nasal spray Records on chart/epic: cbc with dif, ua, cmet, cea done 03-02-18 Driver spouse michael Has surgery orders in epic

## 2018-03-16 ENCOUNTER — Encounter (HOSPITAL_BASED_OUTPATIENT_CLINIC_OR_DEPARTMENT_OTHER): Payer: Self-pay | Admitting: *Deleted

## 2018-03-16 ENCOUNTER — Ambulatory Visit (HOSPITAL_BASED_OUTPATIENT_CLINIC_OR_DEPARTMENT_OTHER)
Admission: RE | Admit: 2018-03-16 | Discharge: 2018-03-16 | Disposition: A | Discharge status: Home / Self Care | Payer: BLUE CROSS/BLUE SHIELD | Attending: Urology | Admitting: Urology

## 2018-03-16 ENCOUNTER — Other Ambulatory Visit: Payer: Self-pay

## 2018-03-16 ENCOUNTER — Ambulatory Visit (HOSPITAL_BASED_OUTPATIENT_CLINIC_OR_DEPARTMENT_OTHER): Payer: BLUE CROSS/BLUE SHIELD | Admitting: Anesthesiology

## 2018-03-16 ENCOUNTER — Encounter (HOSPITAL_BASED_OUTPATIENT_CLINIC_OR_DEPARTMENT_OTHER)
Admission: RE | Disposition: A | Discharge status: Home / Self Care | Payer: Self-pay | Source: Home / Self Care | Attending: Urology

## 2018-03-16 DIAGNOSIS — N1330 Unspecified hydronephrosis: Secondary | ICD-10-CM | POA: Insufficient documentation

## 2018-03-16 DIAGNOSIS — Z9221 Personal history of antineoplastic chemotherapy: Secondary | ICD-10-CM | POA: Diagnosis not present

## 2018-03-16 DIAGNOSIS — Z923 Personal history of irradiation: Secondary | ICD-10-CM | POA: Diagnosis not present

## 2018-03-16 DIAGNOSIS — Z7951 Long term (current) use of inhaled steroids: Secondary | ICD-10-CM | POA: Insufficient documentation

## 2018-03-16 DIAGNOSIS — Z85038 Personal history of other malignant neoplasm of large intestine: Secondary | ICD-10-CM | POA: Diagnosis not present

## 2018-03-16 DIAGNOSIS — Z79899 Other long term (current) drug therapy: Secondary | ICD-10-CM | POA: Insufficient documentation

## 2018-03-16 DIAGNOSIS — Z9049 Acquired absence of other specified parts of digestive tract: Secondary | ICD-10-CM | POA: Diagnosis not present

## 2018-03-16 DIAGNOSIS — D509 Iron deficiency anemia, unspecified: Secondary | ICD-10-CM | POA: Diagnosis not present

## 2018-03-16 HISTORY — PX: CYSTOSCOPY W/ URETERAL STENT PLACEMENT: SHX1429

## 2018-03-16 HISTORY — DX: Personal history of other specified conditions: Z87.898

## 2018-03-16 SURGERY — CYSTOSCOPY, WITH RETROGRADE PYELOGRAM AND URETERAL STENT INSERTION
Anesthesia: General | Site: Ureter | Laterality: Right

## 2018-03-16 MED ORDER — PROMETHAZINE HCL 25 MG/ML IJ SOLN
6.2500 mg | INTRAMUSCULAR | Status: DC | PRN
  Filled 2018-03-16: qty 1

## 2018-03-16 MED ORDER — DEXAMETHASONE SODIUM PHOSPHATE 10 MG/ML IJ SOLN
INTRAMUSCULAR | Status: AC
  Filled 2018-03-16: qty 1

## 2018-03-16 MED ORDER — PROPOFOL 10 MG/ML IV BOLUS
INTRAVENOUS | Status: DC | PRN
  Administered 2018-03-16: 150 mg via INTRAVENOUS

## 2018-03-16 MED ORDER — IOHEXOL 300 MG/ML  SOLN
INTRAMUSCULAR | Status: DC | PRN
  Administered 2018-03-16: 10 mL via URETHRAL

## 2018-03-16 MED ORDER — MIDAZOLAM HCL 2 MG/2ML IJ SOLN
INTRAMUSCULAR | Status: AC
  Filled 2018-03-16: qty 2

## 2018-03-16 MED ORDER — FENTANYL CITRATE (PF) 100 MCG/2ML IJ SOLN
INTRAMUSCULAR | Status: AC
  Filled 2018-03-16: qty 2

## 2018-03-16 MED ORDER — PROPOFOL 10 MG/ML IV BOLUS
INTRAVENOUS | Status: AC
  Filled 2018-03-16: qty 40

## 2018-03-16 MED ORDER — ARTIFICIAL TEARS OPHTHALMIC OINT
TOPICAL_OINTMENT | OPHTHALMIC | Status: AC
  Filled 2018-03-16: qty 7

## 2018-03-16 MED ORDER — FENTANYL CITRATE (PF) 100 MCG/2ML IJ SOLN
25.0000 ug | INTRAMUSCULAR | Status: DC | PRN
  Filled 2018-03-16: qty 1

## 2018-03-16 MED ORDER — DEXAMETHASONE SODIUM PHOSPHATE 10 MG/ML IJ SOLN
INTRAMUSCULAR | Status: DC | PRN
  Administered 2018-03-16: 5 mg via INTRAVENOUS

## 2018-03-16 MED ORDER — KETOROLAC TROMETHAMINE 30 MG/ML IJ SOLN
30.0000 mg | Freq: Once | INTRAMUSCULAR | Status: DC | PRN
  Filled 2018-03-16: qty 1

## 2018-03-16 MED ORDER — LIDOCAINE 2% (20 MG/ML) 5 ML SYRINGE
INTRAMUSCULAR | Status: AC
  Filled 2018-03-16: qty 5

## 2018-03-16 MED ORDER — CIPROFLOXACIN IN D5W 400 MG/200ML IV SOLN
400.0000 mg | INTRAVENOUS | Status: AC
  Administered 2018-03-16: 400 mg via INTRAVENOUS
  Filled 2018-03-16: qty 200

## 2018-03-16 MED ORDER — ONDANSETRON HCL 4 MG/2ML IJ SOLN
INTRAMUSCULAR | Status: AC
  Filled 2018-03-16: qty 2

## 2018-03-16 MED ORDER — CIPROFLOXACIN IN D5W 400 MG/200ML IV SOLN
INTRAVENOUS | Status: AC
  Filled 2018-03-16: qty 200

## 2018-03-16 MED ORDER — MIDAZOLAM HCL 2 MG/2ML IJ SOLN
INTRAMUSCULAR | Status: DC | PRN
  Administered 2018-03-16: 2 mg via INTRAVENOUS

## 2018-03-16 MED ORDER — STERILE WATER FOR IRRIGATION IR SOLN
Status: DC | PRN
  Administered 2018-03-16: 1000 mL
  Administered 2018-03-16: 3000 mL via INTRAVESICAL

## 2018-03-16 MED ORDER — LIDOCAINE 2% (20 MG/ML) 5 ML SYRINGE
INTRAMUSCULAR | Status: DC | PRN
  Administered 2018-03-16: 60 mg via INTRAVENOUS

## 2018-03-16 MED ORDER — ONDANSETRON HCL 4 MG/2ML IJ SOLN
INTRAMUSCULAR | Status: DC | PRN
  Administered 2018-03-16: 4 mg via INTRAVENOUS

## 2018-03-16 MED ORDER — FENTANYL CITRATE (PF) 100 MCG/2ML IJ SOLN
INTRAMUSCULAR | Status: DC | PRN
  Administered 2018-03-16: 50 ug via INTRAVENOUS

## 2018-03-16 MED ORDER — LACTATED RINGERS IV SOLN
INTRAVENOUS | Status: DC
  Administered 2018-03-16: 06:00:00 via INTRAVENOUS
  Filled 2018-03-16: qty 1000

## 2018-03-16 SURGICAL SUPPLY — 19 items
BAG DRAIN URO-CYSTO SKYTR STRL (DRAIN) ×2 IMPLANT
CATH INTERMIT  6FR 70CM (CATHETERS) ×2 IMPLANT
CLOTH BEACON ORANGE TIMEOUT ST (SAFETY) ×2 IMPLANT
GLOVE BIO SURGEON STRL SZ 6.5 (GLOVE) ×2 IMPLANT
GLOVE BIO SURGEON STRL SZ8 (GLOVE) ×2 IMPLANT
GLOVE BIOGEL PI IND STRL 6.5 (GLOVE) ×1 IMPLANT
GLOVE BIOGEL PI INDICATOR 6.5 (GLOVE) ×1
GOWN STRL REUS W/ TWL XL LVL3 (GOWN DISPOSABLE) ×1 IMPLANT
GOWN STRL REUS W/TWL LRG LVL3 (GOWN DISPOSABLE) ×2 IMPLANT
GOWN STRL REUS W/TWL XL LVL3 (GOWN DISPOSABLE) ×1
GUIDEWIRE ANG ZIPWIRE 038X150 (WIRE) ×2 IMPLANT
GUIDEWIRE STR DUAL SENSOR (WIRE) ×2 IMPLANT
KIT TURNOVER CYSTO (KITS) ×2 IMPLANT
MANIFOLD NEPTUNE II (INSTRUMENTS) ×2 IMPLANT
PACK CYSTO (CUSTOM PROCEDURE TRAY) ×2 IMPLANT
STENT URET 6FRX24 CONTOUR (STENTS) IMPLANT
STENT URET 6FRX26 CONTOUR (STENTS) ×2 IMPLANT
TUBE CONNECTING 12X1/4 (SUCTIONS) ×2 IMPLANT
WATER STERILE IRR 1000ML POUR (IV SOLUTION) ×2 IMPLANT

## 2018-03-16 NOTE — Anesthesia Postprocedure Evaluation (Signed)
Anesthesia Post Note  Patient: Carla Mills  Procedure(s) Performed: CYSTOSCOPY WITH RETROGRADE PYELOGRAM/URETERAL STENT PLACEMENT (Right Ureter)     Patient location during evaluation: PACU Anesthesia Type: General Level of consciousness: awake and alert Pain management: pain level controlled Vital Signs Assessment: post-procedure vital signs reviewed and stable Respiratory status: spontaneous breathing, nonlabored ventilation, respiratory function stable and patient connected to nasal cannula oxygen Cardiovascular status: blood pressure returned to baseline and stable Postop Assessment: no apparent nausea or vomiting Anesthetic complications: no    Last Vitals:  Vitals:   03/16/18 0805 03/16/18 0815  BP: 122/68 121/83  Pulse: 79 75  Resp: 18 15  Temp: 36.7 C   SpO2: 99% 99%    Last Pain:  Vitals:   03/16/18 0805  TempSrc:   PainSc: 0-No pain                 Kristofer Schaffert S

## 2018-03-16 NOTE — Transfer of Care (Signed)
Immediate Anesthesia Transfer of Care Note  Patient: Carla Mills  Procedure(s) Performed: CYSTOSCOPY WITH RETROGRADE PYELOGRAM/URETERAL STENT PLACEMENT (Right )  Patient Location: PACU  Anesthesia Type:General  Level of Consciousness: awake, alert , oriented and patient cooperative  Airway & Oxygen Therapy: Patient Spontanous Breathing and Patient connected to nasal cannula oxygen  Post-op Assessment: Report given to RN and Post -op Vital signs reviewed and stable  Post vital signs: Reviewed and stable  Last Vitals:  Vitals Value Taken Time  BP    Temp    Pulse 79 03/16/2018  8:04 AM  Resp    SpO2 99 % 03/16/2018  8:04 AM  Vitals shown include unvalidated device data.  Last Pain:  Vitals:   03/16/18 0554  TempSrc:   PainSc: 0-No pain      Patients Stated Pain Goal: 6 (67/59/16 3846)  Complications: No apparent anesthesia complications

## 2018-03-16 NOTE — Discharge Instructions (Signed)
1. You may see some blood in the urine and may have some burning with urination for 48-72 hours. You also may notice that you have to urinate more frequently or urgently after your procedure which is normal.  2. You should call should you develop an inability urinate, fever > 101, persistent nausea and vomiting that prevents you from eating or drinking to stay hydrated.  3. If you have a stent, you will likely urinate more frequently and urgently until the stent is removed and you may experience some discomfort/pain in the lower abdomen and flank especially when urinating. You may take pain medication prescribed to you if needed for pain. You may also intermittently have blood in the urine until the stent is removed. 4.   Keep a check on your blood pressure.  If it remains slightly elevated, let Dr. Diona Fanti know.  If medicine needed for overactive bladder symptoms, give       [place] a call and I will try you on a different medication. Phone [phone]  prompt 6 for nurse line    Post Anesthesia Home Care Instructions  Activity: Get plenty of rest for the remainder of the day. A responsible individual must stay with you for 24 hours following the procedure.  For the next 24 hours, DO NOT: -Drive a car -Paediatric nurse -Drink alcoholic beverages -Take any medication unless instructed by your physician -Make any legal decisions or sign important papers.  Meals: Start with liquid foods such as gelatin or soup. Progress to regular foods as tolerated. Avoid greasy, spicy, heavy foods. If nausea and/or vomiting occur, drink only clear liquids until the nausea and/or vomiting subsides. Call your physician if vomiting continues.  Special Instructions/Symptoms: Your throat may feel dry or sore from the anesthesia or the breathing tube placed in your throat during surgery. If this causes discomfort, gargle with warm salt water. The discomfort should disappear within 24 hours.  If you had a scopolamine  patch placed behind your ear for the management of post- operative nausea and/or vomiting:  1. The medication in the patch is effective for 72 hours, after which it should be removed.  Wrap patch in a tissue and discard in the trash. Wash hands thoroughly with soap and water. 2. You may remove the patch earlier than 72 hours if you experience unpleasant side effects which may include dry mouth, dizziness or visual disturbances. 3. Avoid touching the patch. Wash your hands with soap and water after contact with the patch.

## 2018-03-16 NOTE — H&P (Signed)
H&P  Chief Complaint: Blocked right kidney  History of Present Illness: Carla Mills is a 47 y.o. year old female with history of recurrent colon carcinoma, presenting at this time for stent placement.  On recent CT/PET scan she was found to have new onset asymptomatic right hydronephrosis.  She was recently seen in the office for consultation regarding this, sent by Dr. Marin Olp.  She presents at this time for cystoscopy, right retrograde, stent placement. Past Medical History:  Diagnosis Date  . 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 Colonic cancer ([place]) 03/09/2015   last chemo sept 2019  . History of chemotherapy    completed on , radiation x 25 tx feb and march 2019  . History of difficult venous access   . PONV (postoperative nausea and vomiting)   . Rectal mass 02/20/15   Rectal/sigmoid mass    Past Surgical History:  Procedure Laterality Date  . ABDOMINAL HYSTERECTOMY  12/03/2017   at [facility] uterus removed and tumor removed tumor lower part of colon and colostomy revision  . APPENDECTOMY  02/20/15   Abnormal appearing  . CESAREAN SECTION     x 2  . COLON RESECTION N/A 03/07/2017   Procedure: EXPLORATORY LAPAROTOMY AND CREATION OF A COLOSTOMY AND MUCOUS FISTULA;  Surgeon: Johnathan Hausen, MD;  Location: [facility];  Service: General;  Laterality: N/A;  . COLONOSCOPY N/A 10/09/2015   Procedure: COLONOSCOPY;  Surgeon: Leighton Ruff, MD;  Location: [facility];  Service: Endoscopy;  Laterality: N/A;  . COLOSTOMY  02/20/15   Sigmoid/rectal mass  . COLOSTOMY N/A 02/20/2015   Procedure: COLOSTOMY;  Surgeon: [name], MD;  Location: [facility];  Service: General;  Laterality: N/A;  . COLOSTOMY REVERSAL N/A 11/15/2015   Procedure: COLOSTOMY REVERSAL;  Surgeon: [name], MD;  Location: [facility];  Service: General;  Laterality: N/A;  . EXPLORATORY LAPAROTOMY  02/20/15   Sigmoid/rectal mass  . FLEXIBLE SIGMOIDOSCOPY N/A 02/15/2015    Procedure: FLEXIBLE SIGMOIDOSCOPY;  Surgeon: [name], MD;  Location: [facility] ENDOSCOPY;  Service: Endoscopy;  Laterality: N/A;  . FLEXIBLE SIGMOIDOSCOPY N/A 03/06/2017   Procedure: FLEXIBLE SIGMOIDOSCOPY;  Surgeon: Yetta Flock, MD;  Location: [facility];  Service: Gastroenterology;  Laterality: N/A;  . FLEXIBLE SIGMOIDOSCOPY N/A 08/22/2017   Procedure: FLEXIBLE SIGMOIDOSCOPY;  Surgeon: Johnathan Hausen, MD;  Location: [facility];  Service: General;  Laterality: N/A;  . HIPEC procedure  07/29/2016   done at [place]  04/08/2016   IR [place] RIGHT 04/08/2016 [name], MD [facility]  . IR GENERIC HISTORICAL  04/08/2016   IR US GUIDE VASC ACCESS RIGHT 04/08/2016 [name], MD [facility]  . LAPAROSCOPIC LYSIS OF ADHESIONS N/A 11/15/2015   Procedure: LAPAROSCOPIC LYSIS OF ADHESIONS;  Surgeon: [name], MD;  Location: [facility];  Service: General;  Laterality: N/A;  . LAPAROSCOPY N/A 02/20/2015   Procedure: LAPAROSCOPY DIAGNOSTIC;  Surgeon: [name], MD;  Location: [facility];  Service: General;  Laterality: N/A;  . LAPAROSCOPY ABDOMEN DIAGNOSTIC  02/20/15   Converted to open - Sigmoid/rectal mass  . LAPAROTOMY N/A 02/20/2015   Procedure: EXPLORATORY LAPAROTOMY AND PARTIAL COLECTOMY;  Surgeon: [name], MD;  Location: [place];  Service: General;  Laterality: N/A;  . LOW ANTERIOR BOWEL RESECTION  02/20/15   Sigmoid/rectal mass  . OSTEOTOMY     reconstructed coccyx   . reconstructed nerve in ankle     . TONSILLECTOMY  Home Medications:  Medications Prior to Admission  Medication Sig Dispense Refill  . fluticasone (FLONASE) 50 MCG/ACT nasal spray Place 1 spray into both nostrils 2 (two) times daily.     . Ibuprofen-Famotidine (DUEXIS) 800-26.6 MG TABS Take 800 mg by mouth 2 (two) times daily as needed. 60 tablet 2  . levofloxacin (LEVAQUIN) 500 MG tablet Take 1 tablet (500 mg total) by mouth daily.  10 tablet 0  . mirabegron ER (MYRBETRIQ) 25 MG TB24 tablet Take 25 mg by mouth every evening.    . traMADol (ULTRAM) 50 MG tablet Take 1 tablet (50 mg total) by mouth every 6 (six) hours as needed. (Patient taking differently: Take 50 mg by mouth every 6 (six) hours as needed for moderate pain. ) 90 tablet 0    Allergies:  Allergies  Allergen Reactions  . Capecitabine Hives and Other (See Comments)    Legs and arms, after completing therapy  . Bactrim [Sulfamethoxazole-Trimethoprim] Swelling and Other (See Comments)    Severe swelling  . Oxaprozin Hives    Day pro  . Penicillins Hives and Other (See Comments)    Has patient had a PCN reaction causing immediate rash, facial/tongue/throat swelling, SOB or lightheadedness with hypotension: no Has patient had a PCN reaction causing severe rash involving mucus membranes or skin necrosis: {no Has patient had a PCN reaction that required hospitalization no Has patient had a PCN reaction occurring within the last 10 years: no If all of the above answers are "NO", then may proceed with Cephalosporin use.  . Sulfonamide Derivatives Swelling and Other (See Comments)    Massive extremity swelling especially bactrim    History reviewed. No pertinent family history.  Social History:  reports that she has never smoked. She has never used smokeless tobacco. She reports current alcohol use. She reports that she does not use drugs.  ROS: A complete review of systems was performed.  All systems are negative except for pertinent findings as noted.  Physical Exam:  Vital signs in last 24 hours: Temp:  [98.9 F (37.2 C)] 98.9 F (37.2 C) (12/23 0536) Pulse Rate:  [89] 89 (12/23 0536) Resp:  [16] 16 (12/23 0536) BP: (133)/(99) 133/99 (12/23 0536) SpO2:  [98 %] 98 % (12/23 0536) Weight:  [71.9 kg] 71.9 kg (12/23 0536) General:  Alert and oriented, No acute distress HEENT: Normocephalic, atraumatic Neck: No JVD or lymphadenopathy Cardiovascular:  Regular rate and rhythm Lungs: Clear bilaterally Abdomen: Soft, nontender, nondistended, no abdominal masses Back: No CVA tenderness Extremities: No edema Neurologic: Grossly intact  Laboratory Data:  No results found for this or any previous visit (from the past 24 hour(s)). No results found for this or any previous visit (from the past 240 hour(s)). Creatinine: No results for input(s): CREATININE in the last 168 hours.  Radiologic Imaging: No results found.  Impression/Assessment:  New onset right hydronephrosis and patient with history of recurrent colon carcinoma, status post chemotherapy.  Plan:  I have explained the process of cystoscopy, retrograde and stent placement with the patient.  We will proceed with this on her hydronephrotic right kidney.  Lillette Boxer Tenise Stetler 03/16/2018, 7:12 AM  Lillette Boxer. Spirit Wernli MD

## 2018-03-16 NOTE — Op Note (Signed)
Operative diagnosis: Right sided hydronephrosis, new onset  Postoperative diagnosis: Same  Principal procedure: Cystoscopy, right retrograde ureteropyelogram, fluoroscopic interpretation, placement of 6 French by 26 cm contour double-J stent without tether  Surgeon: Dashon Mcintire  Anesthesia: General with LMA  Complications: None  Drains: Above mentioned stent  Estimated blood loss: None  Indications: 47 year old female with history of recurrent colon carcinoma.  PET CT scan recently showed new onset of right hydronephrosis.  She is asymptomatic.  She presents at this time for cystoscopy, right retrograde ureteropyelogram, placement of double-J stent on the right.  The procedure as well as risks, complications and side effects of the stent were discussed with the patient and her husband.  They understand and desire to proceed.  Findings: Bladder appeared normal with no evident urothelial abnormality.  Ureteral orifices were normal in configuration location.  Retrograde study on the right revealed a normal distal 2 to 3 cm of the ureter.  Just above this there was a 1 to 2 cm area of concentric narrowing with proximal significant hydroureteronephrosis with an ectatic ureteropelvic junction.  No filling defects were noted.  Description of procedure: The patient was properly identified and marked.  She received preoperative IV Cipro and was taken to the operating room where general anesthetic was administered with the LMA.  She was placed in the dorsolithotomy position.  Genitalia and perineum were prepped and draped.  Proper timeout was performed.  22 French panendoscope was advanced of the bladder with inspection performed.  Ureteral catheter was placed in the right orifice and retrograde ureteropyelogram was performed with Omnipaque with the above-mentioned findings.  I then negotiated, quite easily, a sensor tip guidewire passed the area of stricture in the distal ureter.  There was a proximal  loop in the ureter at the UPJ.  This was somewhat hard to navigate with the sensor tip guidewire.  With the aid of the ureteral access catheter, I was able to easily negotiate a zip wire into the upper pole calyceal system, this was followed by the ureteral catheter which somewhat straightened out the ureter.  The sensor tip guidewire was then replaced.  The open-ended catheter was removed and then deployed a 26 cm x 6 [place] contour double-J stent in the right ureter with proximal distal curl seen adequately with the fluoroscopy and cystoscopy, respectively.  At this point, the bladder was drained.  The scope was removed and the procedure terminated.  The patient was then awakened and taken to the PACU in stable condition.  She tolerated procedure well.

## 2018-03-16 NOTE — Interval H&P Note (Signed)
History and Physical Interval Note:  03/16/2018 7:23 AM  Peterson Ao  has presented today for surgery, with the diagnosis of RIGHT HYDRONEPHROSIS  The various methods of treatment have been discussed with the patient and family. After consideration of risks, benefits and other options for treatment, the patient has consented to  Procedure(s) with comments: Michie (Right) - 7 MINS as a surgical intervention .  [name] history has been reviewed, patient examined, no change in status, stable for surgery.  I have reviewed the patient's chart and labs.  Questions were answered to the patient's satisfaction.     Lillette Boxer Leonilda Cozby

## 2018-03-16 NOTE — Anesthesia Preprocedure Evaluation (Signed)
Anesthesia Evaluation  Patient identified by MRN, date of birth, ID band Patient awake    Reviewed: Allergy & Precautions, NPO status , Patient's Chart, lab work & pertinent test results  History of Anesthesia Complications (+) PONV  Airway Mallampati: II  TM Distance: >3 FB Neck ROM: Full    Dental no notable dental hx.    Pulmonary neg pulmonary ROS,    Pulmonary exam normal breath sounds clear to auscultation       Cardiovascular negative cardio ROS Normal cardiovascular exam Rhythm:Regular Rate:Normal     Neuro/Psych negative neurological ROS  negative psych ROS   GI/Hepatic negative GI ROS, Neg liver ROS,   Endo/Other  negative endocrine ROS  Renal/GU negative Renal ROS  negative genitourinary   Musculoskeletal negative musculoskeletal ROS (+)   Abdominal   Peds negative pediatric ROS (+)  Hematology negative hematology ROS (+)   Anesthesia Other Findings   Reproductive/Obstetrics negative OB ROS                             Anesthesia Physical Anesthesia Plan  ASA: II  Anesthesia Plan: General   Post-op Pain Management:    Induction: Intravenous  PONV Risk Score and Plan: 4 or greater and Dexamethasone, Treatment may vary due to age or medical condition, Midazolam and Scopolamine patch - Pre-op  Airway Management Planned: LMA  Additional Equipment:   Intra-op Plan:   Post-operative Plan: Extubation in OR  Informed Consent: I have reviewed the patients History and Physical, chart, labs and discussed the procedure including the risks, benefits and alternatives for the proposed anesthesia with the patient or authorized representative who has indicated his/her understanding and acceptance.   Dental advisory given  Plan Discussed with: CRNA and Surgeon  Anesthesia Plan Comments:         Anesthesia Quick Evaluation

## 2018-03-16 NOTE — Anesthesia Procedure Notes (Addendum)
Procedure Name: LMA Insertion Date/Time: 03/16/2018 7:33 AM Performed by: Wanita Chamberlain, CRNA Pre-anesthesia Checklist: Patient identified, Emergency Drugs available, Suction available, Patient being monitored and Timeout performed Patient Re-evaluated:Patient Re-evaluated prior to induction Oxygen Delivery Method: Circle system utilized Preoxygenation: Pre-oxygenation with 100% oxygen Induction Type: IV induction Ventilation: Mask ventilation without difficulty LMA: LMA inserted LMA Size: 4.0 Number of attempts: 1 Placement Confirmation: breath sounds checked- equal and bilateral,  CO2 detector and positive ETCO2 Tube secured with: Tape Dental Injury: Teeth and Oropharynx as per pre-operative assessment

## 2018-03-20 ENCOUNTER — Encounter (HOSPITAL_BASED_OUTPATIENT_CLINIC_OR_DEPARTMENT_OTHER): Payer: Self-pay | Admitting: Urology

## 2018-04-02 ENCOUNTER — Telehealth: Payer: Self-pay | Admitting: *Deleted

## 2018-04-02 NOTE — Telephone Encounter (Signed)
Received voice mail message from patient stating,"how come I don't have a follow up appointment with Dr. Marin Olp? We are suppose to discuss appointments and maintenance chemotherapy? My return number is [phone].

## 2018-04-03 ENCOUNTER — Other Ambulatory Visit: Payer: Self-pay | Admitting: Hematology & Oncology

## 2018-04-03 MED ORDER — CAPECITABINE 500 MG PO TABS: ORAL_TABLET | ORAL | 6 refills | Status: DC

## 2018-04-06 ENCOUNTER — Telehealth: Payer: Self-pay | Admitting: Hematology & Oncology

## 2018-04-06 DIAGNOSIS — M545 Low back pain: Secondary | ICD-10-CM | POA: Diagnosis not present

## 2018-04-06 DIAGNOSIS — M62830 Muscle spasm of back: Secondary | ICD-10-CM | POA: Diagnosis not present

## 2018-04-06 DIAGNOSIS — M9904 Segmental and somatic dysfunction of sacral region: Secondary | ICD-10-CM | POA: Diagnosis not present

## 2018-04-06 DIAGNOSIS — M9903 Segmental and somatic dysfunction of lumbar region: Secondary | ICD-10-CM | POA: Diagnosis not present

## 2018-04-06 NOTE — Telephone Encounter (Signed)
Called and spoke with patient regarding appointments added to her schedule per 1/10 sch msg

## 2018-04-08 ENCOUNTER — Other Ambulatory Visit: Payer: Self-pay

## 2018-04-08 ENCOUNTER — Inpatient Hospital Stay: Payer: BLUE CROSS/BLUE SHIELD

## 2018-04-08 ENCOUNTER — Encounter: Payer: Self-pay | Admitting: Hematology & Oncology

## 2018-04-08 ENCOUNTER — Inpatient Hospital Stay: Payer: BLUE CROSS/BLUE SHIELD | Attending: Radiation Oncology | Admitting: Hematology & Oncology

## 2018-04-08 VITALS — BP 115/78 | HR 84 | Temp 98.0°F | Resp 16 | Wt 160.1 lb

## 2018-04-08 DIAGNOSIS — Z923 Personal history of irradiation: Secondary | ICD-10-CM

## 2018-04-08 DIAGNOSIS — N343 Urethral syndrome, unspecified: Secondary | ICD-10-CM

## 2018-04-08 DIAGNOSIS — C189 Malignant neoplasm of colon, unspecified: Secondary | ICD-10-CM

## 2018-04-08 DIAGNOSIS — C772 Secondary and unspecified malignant neoplasm of intra-abdominal lymph nodes: Secondary | ICD-10-CM

## 2018-04-08 DIAGNOSIS — Z931 Gastrostomy status: Secondary | ICD-10-CM

## 2018-04-08 DIAGNOSIS — Z5112 Encounter for antineoplastic immunotherapy: Secondary | ICD-10-CM | POA: Diagnosis not present

## 2018-04-08 DIAGNOSIS — C187 Malignant neoplasm of sigmoid colon: Secondary | ICD-10-CM

## 2018-04-08 DIAGNOSIS — D500 Iron deficiency anemia secondary to blood loss (chronic): Secondary | ICD-10-CM

## 2018-04-08 LAB — CMP (CANCER CENTER ONLY)
ALK PHOS: 77 U/L (ref 38–126)
ALT: 10 U/L (ref 0–44)
AST: 13 U/L — ABNORMAL LOW (ref 15–41)
Albumin: 4.3 g/dL (ref 3.5–5.0)
Anion gap: 8 (ref 5–15)
BUN: 17 mg/dL (ref 6–20)
CO2: 27 mmol/L (ref 22–32)
CREATININE: 0.87 mg/dL (ref 0.44–1.00)
Calcium: 9.4 mg/dL (ref 8.9–10.3)
Chloride: 102 mmol/L (ref 98–111)
GFR, Estimated: >60 mL/min (ref >60)
Glucose, Bld: 108 mg/dL — ABNORMAL HIGH (ref 70–99)
Potassium: 3.9 mmol/L (ref 3.5–5.1)
Sodium: 137 mmol/L (ref 135–145)
Total Bilirubin: 0.3 mg/dL (ref 0.3–1.2)
Total Protein: 6.9 g/dL (ref 6.5–8.1)

## 2018-04-08 LAB — URINALYSIS, COMPLETE (UACMP) WITH MICROSCOPIC
BILIRUBIN URINE: NEGATIVE
Glucose, UA: NEGATIVE mg/dL
Ketones, ur: NEGATIVE mg/dL
NITRITE: NEGATIVE
Protein, ur: NEGATIVE mg/dL
Specific Gravity, Urine: <1.005 — ABNORMAL LOW (ref 1.005–1.030)
pH: 7.5 (ref 5.0–8.0)

## 2018-04-08 LAB — CBC WITH DIFFERENTIAL (CANCER CENTER ONLY)
Abs Immature Granulocytes: 0.04 10*3/uL (ref 0.00–0.07)
Basophils Absolute: 0 10*3/uL (ref 0.0–0.1)
Basophils Relative: 1 %
Eosinophils Absolute: 0.5 10*3/uL (ref 0.0–0.5)
Eosinophils Relative: 6 %
HCT: 39.2 % (ref 36.0–46.0)
Hemoglobin: 12.9 g/dL (ref 12.0–15.0)
Immature Granulocytes: 1 %
Lymphocytes Relative: 9 %
Lymphs Abs: 0.7 10*3/uL (ref 0.7–4.0)
MCH: 29.4 pg (ref 26.0–34.0)
MCHC: 32.9 g/dL (ref 30.0–36.0)
MCV: 89.3 fL (ref 80.0–100.0)
MONOS PCT: 6 %
Monocytes Absolute: 0.5 10*3/uL (ref 0.1–1.0)
Neutro Abs: 6.2 10*3/uL (ref 1.7–7.7)
Neutrophils Relative %: 77 %
Platelet Count: 278 10*3/uL (ref 150–400)
RBC: 4.39 MIL/uL (ref 3.87–5.11)
RDW: 14.1 % (ref 11.5–15.5)
WBC Count: 8 10*3/uL (ref 4.0–10.5)
nRBC: 0 % (ref 0.0–0.2)

## 2018-04-08 MED ORDER — HEPARIN SOD (PORK) LOCK FLUSH 100 UNIT/ML IV SOLN
500.0000 [IU] | Freq: Once | INTRAVENOUS | Status: AC | PRN
  Administered 2018-04-08: 500 [IU]
  Filled 2018-04-08: qty 5

## 2018-04-08 MED ORDER — SODIUM CHLORIDE 0.9 % IV SOLN
Freq: Once | INTRAVENOUS | Status: AC
  Administered 2018-04-08: 16:00:00 via INTRAVENOUS
  Filled 2018-04-08: qty 250

## 2018-04-08 MED ORDER — SODIUM CHLORIDE 0.9% FLUSH
10.0000 mL | INTRAVENOUS | Status: DC | PRN
  Administered 2018-04-08: 10 mL
  Filled 2018-04-08: qty 10

## 2018-04-08 MED ORDER — CAPECITABINE 500 MG PO TABS: ORAL_TABLET | ORAL | 6 refills | Status: DC

## 2018-04-08 MED ORDER — SODIUM CHLORIDE 0.9 % IV SOLN
7.5000 mg/kg | Freq: Once | INTRAVENOUS | Status: AC
  Administered 2018-04-08: 550 mg via INTRAVENOUS
  Filled 2018-04-08: qty 16

## 2018-04-08 MED ORDER — ALTEPLASE 2 MG IJ SOLR
2.0000 mg | Freq: Once | INTRAMUSCULAR | Status: DC | PRN
  Filled 2018-04-08: qty 2

## 2018-04-08 NOTE — Patient Instructions (Signed)
[facility] Discharge Instructions for Patients Receiving Chemotherapy  Today you received the following chemotherapy agents avastin.  To help prevent nausea and vomiting after your treatment, we encourage you to take your nausea medication as indicated by your doctor.   If you develop nausea and vomiting that is not controlled by your nausea medication, call the clinic.   BELOW ARE SYMPTOMS THAT SHOULD BE REPORTED IMMEDIATELY:  *FEVER GREATER THAN 100.5 F  *CHILLS WITH OR WITHOUT FEVER  NAUSEA AND VOMITING THAT IS NOT CONTROLLED WITH YOUR NAUSEA MEDICATION  *UNUSUAL SHORTNESS OF BREATH  *UNUSUAL BRUISING OR BLEEDING  TENDERNESS IN MOUTH AND THROAT WITH OR WITHOUT PRESENCE OF ULCERS  *URINARY PROBLEMS  *BOWEL PROBLEMS  UNUSUAL RASH Items with * indicate a potential emergency and should be followed up as soon as possible.  Feel free to call the clinic should you have any questions or concerns. The clinic phone number is [phone].  Please show the [facility] at check-in to the Emergency Department and triage nurse.

## 2018-04-08 NOTE — Patient Instructions (Signed)
Implanted Port Insertion, Care After  This sheet gives you information about how to care for yourself after your procedure. Your health care provider may also give you more specific instructions. If you have problems or questions, contact your health care provider.  What can I expect after the procedure?  After the procedure, it is common to have:  · Discomfort at the port insertion site.  · Bruising on the skin over the port. This should improve over 3-4 days.  Follow these instructions at home:  Port care  · After your port is placed, you will get a manufacturer's information card. The card has information about your port. Keep this card with you at all times.  · Take care of the port as told by your health care provider. Ask your health care provider if you or a family member can get training for taking care of the port at home. A home health care nurse may also take care of the port.  · Make sure to remember what type of port you have.  Incision care         · Follow instructions from your health care provider about how to take care of your port insertion site. Make sure you:  ? Wash your hands with soap and water before and after you change your bandage (dressing). If soap and water are not available, use hand sanitizer.  ? Change your dressing as told by your health care provider.  ? Leave stitches (sutures), skin glue, or adhesive strips in place. These skin closures may need to stay in place for 2 weeks or longer. If adhesive strip edges start to loosen and curl up, you may trim the loose edges. Do not remove adhesive strips completely unless your health care provider tells you to do that.  · Check your port insertion site every day for signs of infection. Check for:  ? Redness, swelling, or pain.  ? Fluid or blood.  ? Warmth.  ? Pus or a bad smell.  Activity  · Return to your normal activities as told by your health care provider. Ask your health care provider what activities are safe for you.  · Do not  lift anything that is heavier than 10 lb (4.5 kg), or the limit that you are told, until your health care provider says that it is safe.  General instructions  · Take over-the-counter and prescription medicines only as told by your health care provider.  · Do not take baths, swim, or use a hot tub until your health care provider approves. Ask your health care provider if you may take showers. You may only be allowed to take sponge baths.  · Do not drive for 24 hours if you were given a sedative during your procedure.  · Wear a medical alert bracelet in case of an emergency. This will tell any health care providers that you have a port.  · Keep all follow-up visits as told by your health care provider. This is important.  Contact a health care provider if:  · You cannot flush your port with saline as directed, or you cannot draw blood from the port.  · You have a fever or chills.  · You have redness, swelling, or pain around your port insertion site.  · You have fluid or blood coming from your port insertion site.  · Your port insertion site feels warm to the touch.  · You have pus or a bad smell coming from the port   insertion site.  Get help right away if:  · You have chest pain or shortness of breath.  · You have bleeding from your port that you cannot control.  Summary  · Take care of the port as told by your health care provider. Keep the manufacturer's information card with you at all times.  · Change your dressing as told by your health care provider.  · Contact a health care provider if you have a fever or chills or if you have redness, swelling, or pain around your port insertion site.  · Keep all follow-up visits as told by your health care provider.  This information is not intended to replace advice given to you by your health care provider. Make sure you discuss any questions you have with your health care provider.  Document Released: 12/30/2012 Document Revised: 10/07/2017 Document Reviewed:  10/07/2017  Elsevier Interactive Patient Education © 2019 [employer]

## 2018-04-08 NOTE — Progress Notes (Signed)
Hematology and Oncology Follow Up Visit  Carla Mills [id number] 1970-09-11 48 y.o. 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  She and her family had a nice Thanksgiving.  They will have a nice Christmas.  As always, she looks fantastic.  She had a wonderful Christmas with her family.  This year is clearly a "banner" year for them.  They will be doing travel to [place].  There will will be going to [place].  And it is their 66th wedding anniversary.  They are not sure where they will be going.  They actually might be going on an [place] cruise or down to [place].  We did get a PET scan on her back in November.  This showed some hypermetabolic rectal wall thickening.  She had some increased fluid and air and presacral soft tissue thickening.  This might be postoperative or could reflect disease progression.  She had severe right hydronephrosis.  We got her to urology.  They took her to surgery on December 23 and put a stent into the right ureter.  I think this will help her tremendously.  We have never been able to follow her CEA as this is always been normal.  Unfortunately, her tumor does not have any molecular markers that we can target.  This has made things a little bit more challenging for [place].  We will go ahead and treat her with Xeloda/Avastin.  I think this would be a very reasonable treatment for her.  I do not think that she was should have a lot of problems with the Xeloda.  We will have to be careful in areas that she did have past radiation  therapy.  I do not think that the Avastin should not affect her abdominal surgical wounds.  It is now been 4 months since she had her surgery.    She has a colostomy that is working quite well.  She only change the colostomy every 5-7 days.  She has no pain.  Overall, her performance status is ECOG 0.     Medications:  Allergies as of 04/08/2018      Reactions   Capecitabine Hives, Other (See Comments)   Legs and arms, after completing therapy   Bactrim [sulfamethoxazole-trimethoprim] Swelling, Other (See Comments)   Severe swelling   Oxaprozin Hives   Day pro   Penicillins Hives, Other (See Comments)   Has patient had a PCN reaction causing immediate rash, facial/tongue/throat swelling, SOB or lightheadedness with hypotension: no Has patient had a PCN reaction causing severe rash involving mucus membranes or skin necrosis: {no Has patient had a PCN reaction that required hospitalization no Has patient had a PCN reaction occurring within the last 10 years: no If all of the above answers are "NO", then may proceed with Cephalosporin use.   Sulfonamide Derivatives Swelling, Other (See Comments)   Massive extremity swelling especially bactrim      Medication List       Accurate as of April 08, 2018  2:54 PM. Always use your most recent med list.        capecitabine  500 MG tablet Commonly known as:  XELODA Take 5 tablets in the AM and 4 tablets in the PM, daily for 14 days   fluticasone 50 MCG/ACT nasal spray Commonly known as:  FLONASE Place 1 spray into both nostrils 2 (two) times daily.   Ibuprofen-Famotidine 800-26.6 MG Tabs Commonly known as:  DUEXIS Take 800 mg by mouth 2 (two) times daily as needed.   levofloxacin 500 MG tablet Commonly known as:  LEVAQUIN Take 1 tablet (500 mg total) by mouth daily.   MYRBETRIQ 25 MG Tb24 tablet Generic drug:  mirabegron ER Take 25 mg by mouth every evening.   traMADol 50 MG tablet Commonly known as:  ULTRAM Take 1  tablet (50 mg total) by mouth every 6 (six) hours as needed.       Allergies:  Allergies  Allergen Reactions  . Capecitabine Hives and Other (See Comments)    Legs and arms, after completing therapy  . Bactrim [Sulfamethoxazole-Trimethoprim] Swelling and Other (See Comments)    Severe swelling  . Oxaprozin Hives    Day pro  . Penicillins Hives and Other (See Comments)    Has patient had a PCN reaction causing immediate rash, facial/tongue/throat swelling, SOB or lightheadedness with hypotension: no Has patient had a PCN reaction causing severe rash involving mucus membranes or skin necrosis: {no Has patient had a PCN reaction that required hospitalization no Has patient had a PCN reaction occurring within the last 10 years: no If all of the above answers are "NO", then may proceed with Cephalosporin use.  . Sulfonamide Derivatives Swelling and Other (See Comments)    Massive extremity swelling especially bactrim    Past Medical History, Surgical history, Social history, and Family History were reviewed and updated.  Review of Systems: Review of Systems  Constitutional: Negative.   HENT: Negative.   Eyes: Negative.   Respiratory: Negative.   Cardiovascular: Negative.   Gastrointestinal: Negative.   Genitourinary: Negative.   Musculoskeletal: Negative.   Skin: Negative.   Neurological: Negative.   Endo/Heme/Allergies: Negative.   Psychiatric/Behavioral: Negative.   All other systems reviewed and are negative.   Physical Exam:  weight is 160 lb 1.6 oz (72.6 kg). Her oral temperature is 98 F (36.7 C). Her blood pressure is 115/78 and her pulse is 84. Her respiration is 16 and oxygen saturation is 100%.   Wt Readings from Last 3 Encounters:  04/08/18 160 lb 1.6 oz (72.6 kg)  03/16/18 158 lb 9.6 oz (71.9 kg)  03/02/18 161 lb 12 oz (73.4 kg)    Physical Exam Vitals signs reviewed.  HENT:     Head: Normocephalic and atraumatic.  Eyes:     Pupils: Pupils are equal,  round, and reactive to light.  Neck:     Musculoskeletal: Normal range of motion.  Cardiovascular:     Rate and Rhythm: Normal rate and regular rhythm.     Heart sounds: Normal heart sounds.  Pulmonary:     Effort: Pulmonary effort is normal.     Breath sounds: Normal breath sounds.  Abdominal:     General: Bowel sounds are normal.     Palpations: Abdomen is soft.     Comments: She has a colostomy in the lower abdomen.  She has the fistula which is about 3 mm in size at the caudal end of her laparotomy scar.  The laparotomy scar is well  Musculoskeletal: Normal range of motion.        General: No tenderness or deformity.  Lymphadenopathy:     Cervical: No cervical adenopathy.  Skin:    General: Skin is warm and dry.     Findings: No erythema or rash.  Neurological:     Mental Status: She is alert and oriented to person, place, and time.  Psychiatric:        Behavior: Behavior normal.        Thought Content: Thought content normal.        Judgment: Judgment normal.      Lab Results  Component Value Date   WBC 8.0 04/08/2018   HGB 12.9 04/08/2018   HCT 39.2 04/08/2018   MCV 89.3 04/08/2018   PLT 278 04/08/2018   Lab Results  Component Value Date   FERRITIN 451 (H) 03/02/2018   IRON 47 03/02/2018   TIBC 230 (L) 03/02/2018   UIBC 183 03/02/2018   IRONPCTSAT 21 03/02/2018   Lab Results  Component Value Date   RETICCTPCT 0.8 02/12/2015   RBC 4.39 04/08/2018   No results found for: KPAFRELGTCHN, LAMBDASER, KAPLAMBRATIO No results found for: Kandis Cocking, IGMSERUM No results found for: Odetta Pink, SPEI   Chemistry      Component Value Date/Time   NA 137 04/08/2018 1300   NA 145 03/27/2017 1101   NA 141 07/30/2016 0750   K 3.9 04/08/2018 1300   K 4.5 03/27/2017 1101   K 4.2 07/30/2016 0750   CL 102 04/08/2018 1300   CL 103 03/27/2017 1101   CO2 27 04/08/2018 1300   CO2 28 03/27/2017 1101   CO2 26  07/30/2016 0750   BUN 17 04/08/2018 1300   BUN 12 03/27/2017 1101   BUN 12.1 07/30/2016 0750   CREATININE 0.87 04/08/2018 1300   CREATININE 0.9 03/27/2017 1101   CREATININE 0.7 07/30/2016 0750      Component Value Date/Time   CALCIUM 9.4 04/08/2018 1300   CALCIUM 9.6 03/27/2017 1101   CALCIUM 9.5 07/30/2016 0750   ALKPHOS 77 04/08/2018 1300   ALKPHOS 101 (H) 03/27/2017 1101   ALKPHOS 72 07/30/2016 0750   AST 13 (L) 04/08/2018 1300   AST 22 07/30/2016 0750   ALT 10 04/08/2018 1300   ALT 20 03/27/2017 1101   ALT 22 07/30/2016 0750   BILITOT 0.3 04/08/2018 1300   BILITOT 0.54 07/30/2016 0750      Impression and Plan: Carla Mills is a very pleasant 48 yo caucasian female with recurrent colon cancer - HER2 (-)/KRAS (+)/MSI stable/MMR normal/BRAF -.   We will go ahead and treat her today with Avastin.  The Xeloda was called in a day or so ago.  She will take 2000 mg in the a.m. and 2500 mg in the p.m.  I think this is reasonable.  I went over the side effects of Xeloda and Avastin with she and her husband.  She is well aware of the Xeloda as she has had this before.  I will plan to see her back in 3 weeks.  I will give her 3 cycles of treatment and then we will repeat the PET scan and see how everything looks.   Volanda Napoleon, MD 1/15/20202:54 PM

## 2018-04-09 LAB — IRON AND TIBC
Iron: 28 ug/dL — ABNORMAL LOW (ref 41–142)
Saturation Ratios: 11 % — ABNORMAL LOW (ref 21–57)
TIBC: 259 ug/dL (ref 236–444)
UIBC: 231 ug/dL (ref 120–384)

## 2018-04-09 LAB — FERRITIN: Ferritin: 337 ng/mL — ABNORMAL HIGH (ref 11–307)

## 2018-04-09 LAB — CEA (IN HOUSE-CHCC): CEA (CHCC-In House): 2.06 ng/mL (ref 0.00–5.00)

## 2018-04-13 DIAGNOSIS — R350 Frequency of micturition: Secondary | ICD-10-CM | POA: Diagnosis not present

## 2018-04-13 DIAGNOSIS — N131 Hydronephrosis with ureteral stricture, not elsewhere classified: Secondary | ICD-10-CM | POA: Diagnosis not present

## 2018-04-15 ENCOUNTER — Other Ambulatory Visit: Payer: Self-pay | Admitting: Family

## 2018-04-15 ENCOUNTER — Encounter: Payer: Self-pay | Admitting: *Deleted

## 2018-04-21 ENCOUNTER — Telehealth: Payer: Self-pay | Admitting: Hematology & Oncology

## 2018-04-21 NOTE — Telephone Encounter (Signed)
Called and spoke with patient regarding moving appointment on 2/4 due to Dr Marin Olp out of office after 10:00 per Hilario Quarry, rn. Patient was ok with moving appointment for Shamonique's schedule.

## 2018-04-28 ENCOUNTER — Inpatient Hospital Stay: Payer: BLUE CROSS/BLUE SHIELD | Attending: Radiation Oncology

## 2018-04-28 ENCOUNTER — Other Ambulatory Visit: Payer: BLUE CROSS/BLUE SHIELD

## 2018-04-28 ENCOUNTER — Encounter: Payer: Self-pay | Admitting: Family

## 2018-04-28 ENCOUNTER — Inpatient Hospital Stay: Payer: BLUE CROSS/BLUE SHIELD

## 2018-04-28 ENCOUNTER — Ambulatory Visit: Payer: BLUE CROSS/BLUE SHIELD

## 2018-04-28 ENCOUNTER — Other Ambulatory Visit: Payer: Self-pay

## 2018-04-28 ENCOUNTER — Ambulatory Visit: Payer: BLUE CROSS/BLUE SHIELD | Admitting: Hematology & Oncology

## 2018-04-28 ENCOUNTER — Inpatient Hospital Stay (HOSPITAL_BASED_OUTPATIENT_CLINIC_OR_DEPARTMENT_OTHER): Payer: BLUE CROSS/BLUE SHIELD | Admitting: Family

## 2018-04-28 ENCOUNTER — Other Ambulatory Visit: Payer: Self-pay | Admitting: Family

## 2018-04-28 VITALS — BP 115/76 | HR 84 | Temp 97.7°F | Resp 17 | Ht 65.0 in | Wt 159.0 lb

## 2018-04-28 DIAGNOSIS — C189 Malignant neoplasm of colon, unspecified: Secondary | ICD-10-CM | POA: Diagnosis not present

## 2018-04-28 DIAGNOSIS — Z923 Personal history of irradiation: Secondary | ICD-10-CM | POA: Insufficient documentation

## 2018-04-28 DIAGNOSIS — L03115 Cellulitis of right lower limb: Secondary | ICD-10-CM

## 2018-04-28 DIAGNOSIS — L03317 Cellulitis of buttock: Secondary | ICD-10-CM | POA: Diagnosis not present

## 2018-04-28 DIAGNOSIS — G629 Polyneuropathy, unspecified: Secondary | ICD-10-CM | POA: Diagnosis not present

## 2018-04-28 DIAGNOSIS — D500 Iron deficiency anemia secondary to blood loss (chronic): Secondary | ICD-10-CM

## 2018-04-28 DIAGNOSIS — R369 Urethral discharge, unspecified: Secondary | ICD-10-CM | POA: Diagnosis not present

## 2018-04-28 DIAGNOSIS — Z5112 Encounter for antineoplastic immunotherapy: Secondary | ICD-10-CM | POA: Diagnosis not present

## 2018-04-28 DIAGNOSIS — R809 Proteinuria, unspecified: Secondary | ICD-10-CM

## 2018-04-28 DIAGNOSIS — Z95828 Presence of other vascular implants and grafts: Secondary | ICD-10-CM

## 2018-04-28 DIAGNOSIS — Z9221 Personal history of antineoplastic chemotherapy: Secondary | ICD-10-CM | POA: Insufficient documentation

## 2018-04-28 LAB — CBC WITH DIFFERENTIAL (CANCER CENTER ONLY)
Abs Immature Granulocytes: 0.02 10*3/uL (ref 0.00–0.07)
Basophils Absolute: 0 10*3/uL (ref 0.0–0.1)
Basophils Relative: 0 %
Eosinophils Absolute: 0.2 10*3/uL (ref 0.0–0.5)
Eosinophils Relative: 5 %
HCT: 36.6 % (ref 36.0–46.0)
Hemoglobin: 12.3 g/dL (ref 12.0–15.0)
Immature Granulocytes: 0 %
LYMPHS PCT: 9 %
Lymphs Abs: 0.5 10*3/uL — ABNORMAL LOW (ref 0.7–4.0)
MCH: 30.1 pg (ref 26.0–34.0)
MCHC: 33.6 g/dL (ref 30.0–36.0)
MCV: 89.7 fL (ref 80.0–100.0)
Monocytes Absolute: 0.6 10*3/uL (ref 0.1–1.0)
Monocytes Relative: 10 %
NEUTROS PCT: 76 %
Neutro Abs: 4.1 10*3/uL (ref 1.7–7.7)
Platelet Count: 228 10*3/uL (ref 150–400)
RBC: 4.08 MIL/uL (ref 3.87–5.11)
RDW: 13.9 % (ref 11.5–15.5)
WBC Count: 5.4 10*3/uL (ref 4.0–10.5)
nRBC: 0 % (ref 0.0–0.2)

## 2018-04-28 LAB — CMP (CANCER CENTER ONLY)
ALT: 10 U/L (ref 0–44)
AST: 14 U/L — ABNORMAL LOW (ref 15–41)
Albumin: 4.4 g/dL (ref 3.5–5.0)
Alkaline Phosphatase: 76 U/L (ref 38–126)
Anion gap: 8 (ref 5–15)
BUN: 21 mg/dL — ABNORMAL HIGH (ref 6–20)
CO2: 26 mmol/L (ref 22–32)
Calcium: 9.5 mg/dL (ref 8.9–10.3)
Chloride: 103 mmol/L (ref 98–111)
Creatinine: 1.03 mg/dL — ABNORMAL HIGH (ref 0.44–1.00)
GFR, Estimated: >60 mL/min (ref >60)
Glucose, Bld: 99 mg/dL (ref 70–99)
Potassium: 4.1 mmol/L (ref 3.5–5.1)
Sodium: 137 mmol/L (ref 135–145)
Total Bilirubin: 0.4 mg/dL (ref 0.3–1.2)
Total Protein: 6.7 g/dL (ref 6.5–8.1)

## 2018-04-28 LAB — TOTAL PROTEIN, URINE DIPSTICK: Protein, ur: 100 mg/dL — AB

## 2018-04-28 MED ORDER — SODIUM CHLORIDE 0.9% FLUSH
10.0000 mL | INTRAVENOUS | Status: AC | PRN
  Administered 2018-04-28: 10 mL via INTRAVENOUS
  Filled 2018-04-28: qty 10

## 2018-04-28 MED ORDER — HEPARIN SOD (PORK) LOCK FLUSH 100 UNIT/ML IV SOLN
500.0000 [IU] | Freq: Once | INTRAVENOUS | Status: AC
  Administered 2018-04-28: 500 [IU] via INTRAVENOUS
  Filled 2018-04-28: qty 5

## 2018-04-28 MED ORDER — DOXYCYCLINE HYCLATE 100 MG PO TABS: 100.0000 mg | ORAL_TABLET | Freq: Two times a day (BID) | ORAL | 0 refills | Status: DC

## 2018-04-28 MED FILL — DOXYCYCLINE HYCLATE 100 MG: 100 | 7 days supply | Qty: 14 | Fill #0

## 2018-04-28 NOTE — Progress Notes (Addendum)
Hematology and Oncology Follow Up Visit  Carla Mills [id number] 1970-11-28 48 y.o. 04/28/2018   Principle Diagnosis:  Recurrent colon cancer - HER2 (-)/KRAS mutant/MSI stable/MMR normal/BRAF (wt) -   Past Therapy:             FOLFOXIRI - s/p cycle #12 -completed on 11/12/2016 Radiation therapy with Xeloda - completed on 06/06/2017 Status post exploratory laparotomy with HIPEC - 07/29/2016 FOLFOXIRI/Avastin - s/p cycle #8 (Avastin on hold)  Current Therapy:   Xeloda/Avastin - cycle 1 on 04/08/2018   Interim History:  Carla Mills is here today for follow-up. She has an area of cellulitis at the base of her right buttock with 3 scabs that appear to be possible bite marks. This is tender to the touch but she denies itching. This is no open lesion or discharge.  No fever, chills, cough, dizziness, SOB, chest pain, palpitations, abdominal pain or changes in bladder habits.  She had n/v x 5 last week on Wednesday and did not take her Xeloda that evening.  She has some mild fatigue at times.  She has had a couple migraines in the last couple weeks.  She has had some sinus drainage and is using Flonase as needed.  She is staying active walking in the evenings with her family.  Since starting Xeloda, her BM's have been loose and infrequent occurring every 2-3 days.  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 This is stable but she notes that it gets more intense after she works out.  No swelling in her extremities at this time.  No falls or syncopal episodes to report.  No episodes of bleeding, no bruising or petechiae.   ECOG Performance Status: 1 - Symptomatic but completely ambulatory  Medications:  Allergies as of 04/28/2018      Reactions   Capecitabine Hives, Other (See Comments)   Legs and arms, after completing therapy   Bactrim [sulfamethoxazole-trimethoprim] Swelling, Other (See Comments)   Severe swelling   Oxaprozin Hives   Day pro   Penicillins  Hives, Other (See Comments)   Has patient had a PCN reaction causing immediate rash, facial/tongue/throat swelling, SOB or lightheadedness with hypotension: no Has patient had a PCN reaction causing severe rash involving mucus membranes or skin necrosis: {no Has patient had a PCN reaction that required hospitalization no Has patient had a PCN reaction occurring within the last 10 years: no If all of the above answers are "NO", then may proceed with Cephalosporin use.   Sulfonamide Derivatives Swelling, Other (See Comments)   Massive extremity swelling especially bactrim      Medication List       Accurate as of April 28, 2018  2:57 PM. Always use your most recent med list.        capecitabine 500 MG tablet Commonly known as:  XELODA Take 5 tablets in the AM and 4 tablets in the PM, daily for 14 days   fluticasone 50 MCG/ACT nasal spray Commonly known as:  FLONASE Place 1 spray into both nostrils 2 (two) times daily.   levofloxacin 500 MG tablet Commonly known as:  LEVAQUIN Take 1 tablet (500 mg total) by mouth daily.   MYRBETRIQ 25 MG Tb24 tablet Generic drug:  mirabegron ER Take 25 mg by mouth every evening.   traMADol 50 MG tablet Commonly known as:  ULTRAM Take 1 tablet (50 mg total) by mouth every 6 (six) hours as needed.       Allergies:  Allergies  Allergen Reactions  . Capecitabine Hives and Other (See Comments)    Legs and arms, after completing therapy  . Bactrim [Sulfamethoxazole-Trimethoprim] Swelling and Other (See Comments)    Severe swelling  . Oxaprozin Hives    Day pro  . Penicillins Hives and Other (See Comments)    Has patient had a PCN reaction causing immediate rash, facial/tongue/throat swelling, SOB or lightheadedness with hypotension: no Has patient had a PCN reaction causing severe rash involving mucus membranes or skin necrosis: {no Has patient had a PCN reaction that required hospitalization no Has patient had a PCN reaction occurring  within the last 10 years: no If all of the above answers are "NO", then may proceed with Cephalosporin use.  . Sulfonamide Derivatives Swelling and Other (See Comments)    Massive extremity swelling especially bactrim    Past Medical History, Surgical history, Social history, and Family History were reviewed and updated.  Review of Systems: All other 10 point review of systems is negative.   Physical Exam:  height is '5\' 5"'  (1.651 m) and weight is 159 lb (72.1 kg). Her oral temperature is 97.7 F (36.5 C). Her blood pressure is 115/76 and her pulse is 84. Her respiration is 17 and oxygen saturation is 100%.   Wt Readings from Last 3 Encounters:  04/28/18 159 lb (72.1 kg)  04/08/18 160 lb 1.6 oz (72.6 kg)  03/16/18 158 lb 9.6 oz (71.9 kg)    Ocular: Sclerae unicteric, pupils equal, round and reactive to light Ear-nose-throat: Oropharynx clear, dentition fair Lymphatic: No cervical, supraclavicular or axillary adenopathy Lungs no rales or rhonchi, good excursion bilaterally Heart regular rate and rhythm, no murmur appreciated Abd soft, nontender, positive bowel sounds, no liver or spleen tip palpated on exam, no fluid wave  MSK no focal spinal tenderness, no joint edema Neuro: non-focal, well-oriented, appropriate affect Breasts: Deferred   Lab Results  Component Value Date   WBC 5.4 04/28/2018   HGB 12.3 04/28/2018   HCT 36.6 04/28/2018   MCV 89.7 04/28/2018   PLT 228 04/28/2018   Lab Results  Component Value Date   FERRITIN 337 (H) 04/08/2018   IRON 28 (L) 04/08/2018   TIBC 259 04/08/2018   UIBC 231 04/08/2018   IRONPCTSAT 11 (L) 04/08/2018   Lab Results  Component Value Date   RETICCTPCT 0.8 02/12/2015   RBC 4.08 04/28/2018   No results found for: KPAFRELGTCHN, LAMBDASER, KAPLAMBRATIO No results found for: IGGSERUM, IGA, IGMSERUM No results found for: Odetta Pink, SPEI   Chemistry      Component Value  Date/Time   NA 137 04/28/2018 1331   NA 145 03/27/2017 1101   NA 141 07/30/2016 0750   K 4.1 04/28/2018 1331   K 4.5 03/27/2017 1101   K 4.2 07/30/2016 0750   CL 103 04/28/2018 1331   CL 103 03/27/2017 1101   CO2 26 04/28/2018 1331   CO2 28 03/27/2017 1101   CO2 26 07/30/2016 0750   BUN 21 (H) 04/28/2018 1331   BUN 12 03/27/2017 1101   BUN 12.1 07/30/2016 0750   CREATININE 1.03 (H) 04/28/2018 1331   CREATININE 0.9 03/27/2017 1101   CREATININE 0.7 07/30/2016 0750      Component Value Date/Time   CALCIUM 9.5 04/28/2018 1331   CALCIUM 9.6 03/27/2017 1101   CALCIUM 9.5 07/30/2016 0750   ALKPHOS 76 04/28/2018 1331   ALKPHOS 101 (H) 03/27/2017 1101   ALKPHOS 72 07/30/2016 0750   AST  14 (L) 04/28/2018 1331   AST 22 07/30/2016 0750   ALT 10 04/28/2018 1331   ALT 20 03/27/2017 1101   ALT 22 07/30/2016 0750   BILITOT 0.4 04/28/2018 1331   BILITOT 0.54 07/30/2016 0750       Impression and Plan: Carla Mills is a very pleasant 48 yo caucasian female with recurrent colon cancer - HER2 (-)/KRAS (+)/MSI stable/MMR normal/BRAF -.  As mentioned above she has the patch of cellulitis below the right buttock. We will treat her with 7 days of Doxycycline 100 mg PO BID.  Unfortunately she did have protein present in her urine today which is new. I spoke with Dr. Maylon Peppers an this is considered grade 2 so we will hold her treatment today. She is tearful hearing this new but is in agreement. She gets discouraged with delays in treatment.  We will send her home with supplies for a 24 hour urine and she will complete starting Sunday and bring in Monday. We will move everything out to next week with follow-up.  I did contact Dr. Diona Fanti about the heavy urethral discharge and am waiting to hear back.  She will contact our office with any questions or concerns. We can certainly see her sooner if need be.    Laverna Peace, NP 2/4/20202:57 PM   Addendum: I spoke with Dr. Marin Olp and he was in  agreement with holding treatment on 04/28/2018 we will also hold Xeloda and resume next week with Avastin.

## 2018-04-28 NOTE — Patient Instructions (Signed)
Implanted Port Insertion, Care After  This sheet gives you information about how to care for yourself after your procedure. Your health care provider may also give you more specific instructions. If you have problems or questions, contact your health care provider.  What can I expect after the procedure?  After the procedure, it is common to have:  · Discomfort at the port insertion site.  · Bruising on the skin over the port. This should improve over 3-4 days.  Follow these instructions at home:  Port care  · After your port is placed, you will get a manufacturer's information card. The card has information about your port. Keep this card with you at all times.  · Take care of the port as told by your health care provider. Ask your health care provider if you or a family member can get training for taking care of the port at home. A home health care nurse may also take care of the port.  · Make sure to remember what type of port you have.  Incision care         · Follow instructions from your health care provider about how to take care of your port insertion site. Make sure you:  ? Wash your hands with soap and water before and after you change your bandage (dressing). If soap and water are not available, use hand sanitizer.  ? Change your dressing as told by your health care provider.  ? Leave stitches (sutures), skin glue, or adhesive strips in place. These skin closures may need to stay in place for 2 weeks or longer. If adhesive strip edges start to loosen and curl up, you may trim the loose edges. Do not remove adhesive strips completely unless your health care provider tells you to do that.  · Check your port insertion site every day for signs of infection. Check for:  ? Redness, swelling, or pain.  ? Fluid or blood.  ? Warmth.  ? Pus or a bad smell.  Activity  · Return to your normal activities as told by your health care provider. Ask your health care provider what activities are safe for you.  · Do not  lift anything that is heavier than 10 lb (4.5 kg), or the limit that you are told, until your health care provider says that it is safe.  General instructions  · Take over-the-counter and prescription medicines only as told by your health care provider.  · Do not take baths, swim, or use a hot tub until your health care provider approves. Ask your health care provider if you may take showers. You may only be allowed to take sponge baths.  · Do not drive for 24 hours if you were given a sedative during your procedure.  · Wear a medical alert bracelet in case of an emergency. This will tell any health care providers that you have a port.  · Keep all follow-up visits as told by your health care provider. This is important.  Contact a health care provider if:  · You cannot flush your port with saline as directed, or you cannot draw blood from the port.  · You have a fever or chills.  · You have redness, swelling, or pain around your port insertion site.  · You have fluid or blood coming from your port insertion site.  · Your port insertion site feels warm to the touch.  · You have pus or a bad smell coming from the port   insertion site.  Get help right away if:  · You have chest pain or shortness of breath.  · You have bleeding from your port that you cannot control.  Summary  · Take care of the port as told by your health care provider. Keep the manufacturer's information card with you at all times.  · Change your dressing as told by your health care provider.  · Contact a health care provider if you have a fever or chills or if you have redness, swelling, or pain around your port insertion site.  · Keep all follow-up visits as told by your health care provider.  This information is not intended to replace advice given to you by your health care provider. Make sure you discuss any questions you have with your health care provider.  Document Released: 12/30/2012 Document Revised: 10/07/2017 Document Reviewed:  10/07/2017  Elsevier Interactive Patient Education © 2019 [employer]

## 2018-04-28 NOTE — Addendum Note (Signed)
Addended by: San Morelle on: 04/28/2018 03:20 PM   Modules accepted: Orders, SmartSet

## 2018-04-29 LAB — IRON AND TIBC
Iron: 29 ug/dL — ABNORMAL LOW (ref 41–142)
Saturation Ratios: 10 % — ABNORMAL LOW (ref 21–57)
TIBC: 289 ug/dL (ref 236–444)
UIBC: 260 ug/dL (ref 120–384)

## 2018-04-29 LAB — FERRITIN: Ferritin: 412 ng/mL — ABNORMAL HIGH (ref 11–307)

## 2018-05-04 DIAGNOSIS — C189 Malignant neoplasm of colon, unspecified: Secondary | ICD-10-CM | POA: Diagnosis not present

## 2018-05-04 DIAGNOSIS — Z9221 Personal history of antineoplastic chemotherapy: Secondary | ICD-10-CM | POA: Diagnosis not present

## 2018-05-04 DIAGNOSIS — G629 Polyneuropathy, unspecified: Secondary | ICD-10-CM | POA: Diagnosis not present

## 2018-05-04 DIAGNOSIS — R369 Urethral discharge, unspecified: Secondary | ICD-10-CM | POA: Diagnosis not present

## 2018-05-04 DIAGNOSIS — R809 Proteinuria, unspecified: Secondary | ICD-10-CM | POA: Diagnosis not present

## 2018-05-04 DIAGNOSIS — M9903 Segmental and somatic dysfunction of lumbar region: Secondary | ICD-10-CM | POA: Diagnosis not present

## 2018-05-04 DIAGNOSIS — M62830 Muscle spasm of back: Secondary | ICD-10-CM | POA: Diagnosis not present

## 2018-05-04 DIAGNOSIS — L03317 Cellulitis of buttock: Secondary | ICD-10-CM | POA: Diagnosis not present

## 2018-05-04 DIAGNOSIS — M545 Low back pain: Secondary | ICD-10-CM | POA: Diagnosis not present

## 2018-05-04 DIAGNOSIS — Z923 Personal history of irradiation: Secondary | ICD-10-CM | POA: Diagnosis not present

## 2018-05-04 DIAGNOSIS — Z5112 Encounter for antineoplastic immunotherapy: Secondary | ICD-10-CM | POA: Diagnosis not present

## 2018-05-04 DIAGNOSIS — M9904 Segmental and somatic dysfunction of sacral region: Secondary | ICD-10-CM | POA: Diagnosis not present

## 2018-05-05 ENCOUNTER — Inpatient Hospital Stay (HOSPITAL_BASED_OUTPATIENT_CLINIC_OR_DEPARTMENT_OTHER): Payer: BLUE CROSS/BLUE SHIELD | Admitting: Family

## 2018-05-05 ENCOUNTER — Other Ambulatory Visit: Payer: Self-pay

## 2018-05-05 ENCOUNTER — Inpatient Hospital Stay: Payer: BLUE CROSS/BLUE SHIELD

## 2018-05-05 ENCOUNTER — Other Ambulatory Visit: Payer: Self-pay | Admitting: *Deleted

## 2018-05-05 ENCOUNTER — Encounter: Payer: Self-pay | Admitting: Family

## 2018-05-05 VITALS — BP 107/67

## 2018-05-05 VITALS — BP 108/68 | HR 82 | Temp 97.7°F | Resp 18 | Ht 65.0 in | Wt 159.0 lb

## 2018-05-05 DIAGNOSIS — L03312 Cellulitis of back [any part except buttock]: Secondary | ICD-10-CM | POA: Diagnosis not present

## 2018-05-05 DIAGNOSIS — C189 Malignant neoplasm of colon, unspecified: Secondary | ICD-10-CM

## 2018-05-05 DIAGNOSIS — D500 Iron deficiency anemia secondary to blood loss (chronic): Secondary | ICD-10-CM

## 2018-05-05 DIAGNOSIS — Z9221 Personal history of antineoplastic chemotherapy: Secondary | ICD-10-CM | POA: Diagnosis not present

## 2018-05-05 DIAGNOSIS — L03317 Cellulitis of buttock: Secondary | ICD-10-CM | POA: Diagnosis not present

## 2018-05-05 DIAGNOSIS — G629 Polyneuropathy, unspecified: Secondary | ICD-10-CM | POA: Diagnosis not present

## 2018-05-05 DIAGNOSIS — R809 Proteinuria, unspecified: Secondary | ICD-10-CM

## 2018-05-05 DIAGNOSIS — R369 Urethral discharge, unspecified: Secondary | ICD-10-CM | POA: Diagnosis not present

## 2018-05-05 DIAGNOSIS — Z923 Personal history of irradiation: Secondary | ICD-10-CM | POA: Diagnosis not present

## 2018-05-05 DIAGNOSIS — L03115 Cellulitis of right lower limb: Secondary | ICD-10-CM

## 2018-05-05 DIAGNOSIS — Z5112 Encounter for antineoplastic immunotherapy: Secondary | ICD-10-CM | POA: Diagnosis not present

## 2018-05-05 LAB — CMP (CANCER CENTER ONLY)
ALT: 14 U/L (ref 0–44)
AST: 16 U/L (ref 15–41)
Albumin: 4.2 g/dL (ref 3.5–5.0)
Alkaline Phosphatase: 78 U/L (ref 38–126)
Anion gap: 9 (ref 5–15)
BUN: 20 mg/dL (ref 6–20)
CO2: 26 mmol/L (ref 22–32)
Calcium: 9.7 mg/dL (ref 8.9–10.3)
Chloride: 105 mmol/L (ref 98–111)
Creatinine: 0.82 mg/dL (ref 0.44–1.00)
GFR, Est AFR Am: >60 mL/min (ref >60)
GFR, Estimated: >60 mL/min (ref >60)
Glucose, Bld: 89 mg/dL (ref 70–99)
Potassium: 4 mmol/L (ref 3.5–5.1)
Sodium: 140 mmol/L (ref 135–145)
Total Bilirubin: 0.4 mg/dL (ref 0.3–1.2)
Total Protein: 6.5 g/dL (ref 6.5–8.1)

## 2018-05-05 LAB — UPEP/TP, 24-HR URINE
Albumin, U: 45.7 %
Alpha 1, Urine: 3.8 %
Alpha 2, Urine: 5.7 %
BETA UR: 27.4 %
Gamma Globulin, Urine: 17.3 %
TOTAL PROTEIN, URINE-UR/DAY: 583 mg/(24.h) — AB (ref 30–150)
Total Protein, Urine: 27.1 mg/dL
Total Volume: 2150

## 2018-05-05 LAB — CBC WITH DIFFERENTIAL (CANCER CENTER ONLY)
ABS IMMATURE GRANULOCYTES: 0.01 10*3/uL (ref 0.00–0.07)
Basophils Absolute: 0 10*3/uL (ref 0.0–0.1)
Basophils Relative: 0 %
Eosinophils Absolute: 0.4 10*3/uL (ref 0.0–0.5)
Eosinophils Relative: 7 %
HCT: 38.5 % (ref 36.0–46.0)
Hemoglobin: 12.8 g/dL (ref 12.0–15.0)
Immature Granulocytes: 0 %
Lymphocytes Relative: 19 %
Lymphs Abs: 1 10*3/uL (ref 0.7–4.0)
MCH: 30.5 pg (ref 26.0–34.0)
MCHC: 33.2 g/dL (ref 30.0–36.0)
MCV: 91.7 fL (ref 80.0–100.0)
Monocytes Absolute: 0.4 10*3/uL (ref 0.1–1.0)
Monocytes Relative: 8 %
Neutro Abs: 3.7 10*3/uL (ref 1.7–7.7)
Neutrophils Relative %: 66 %
Platelet Count: 242 10*3/uL (ref 150–400)
RBC: 4.2 MIL/uL (ref 3.87–5.11)
RDW: 14.3 % (ref 11.5–15.5)
WBC Count: 5.6 10*3/uL (ref 4.0–10.5)
nRBC: 0 % (ref 0.0–0.2)

## 2018-05-05 LAB — TOTAL PROTEIN, URINE DIPSTICK: Protein, ur: NEGATIVE mg/dL

## 2018-05-05 LAB — IRON AND TIBC
IRON: 111 ug/dL (ref 41–142)
Saturation Ratios: 37 % (ref 21–57)
TIBC: 296 ug/dL (ref 236–444)
UIBC: 185 ug/dL (ref 120–384)

## 2018-05-05 LAB — FERRITIN: Ferritin: 343 ng/mL — ABNORMAL HIGH (ref 11–307)

## 2018-05-05 MED ORDER — HEPARIN SOD (PORK) LOCK FLUSH 100 UNIT/ML IV SOLN
500.0000 [IU] | Freq: Once | INTRAVENOUS | Status: AC | PRN
  Administered 2018-05-05: 500 [IU]
  Filled 2018-05-05: qty 5

## 2018-05-05 MED ORDER — SODIUM CHLORIDE 0.9% FLUSH
10.0000 mL | INTRAVENOUS | Status: DC | PRN
  Administered 2018-05-05: 10 mL
  Filled 2018-05-05: qty 10

## 2018-05-05 MED ORDER — SODIUM CHLORIDE 0.9 % IV SOLN
7.5000 mg/kg | Freq: Once | INTRAVENOUS | Status: AC
  Administered 2018-05-05: 550 mg via INTRAVENOUS
  Filled 2018-05-05: qty 16

## 2018-05-05 MED ORDER — SODIUM CHLORIDE 0.9 % IV SOLN
Freq: Once | INTRAVENOUS | Status: AC
  Administered 2018-05-05: 10:00:00 via INTRAVENOUS
  Filled 2018-05-05: qty 250

## 2018-05-05 NOTE — Patient Instructions (Signed)
Bevacizumab injection  What is this medicine?  BEVACIZUMAB (be va SIZ yoo mab) is a monoclonal antibody. It is used to treat many types of cancer.  This medicine may be used for other purposes; ask your health care provider or pharmacist if you have questions.  COMMON BRAND NAME(S): Avastin, MVASI  What should I tell my health care provider before I take this medicine?  They need to know if you have any of these conditions:  -diabetes  -heart disease  -high blood pressure  -history of coughing up blood  -prior anthracycline chemotherapy (e.g., doxorubicin, daunorubicin, epirubicin)  -recent or ongoing radiation therapy  -recent or planning to have surgery  -stroke  -an unusual or allergic reaction to bevacizumab, hamster proteins, mouse proteins, other medicines, foods, dyes, or preservatives  -pregnant or trying to get pregnant  -breast-feeding  How should I use this medicine?  This medicine is for infusion into a vein. It is given by a health care professional in a hospital or clinic setting.  Talk to your pediatrician regarding the use of this medicine in children. Special care may be needed.  Overdosage: If you think you have taken too much of this medicine contact a poison control center or emergency room at once.  NOTE: This medicine is only for you. Do not share this medicine with others.  What if I miss a dose?  It is important not to miss your dose. Call your doctor or health care professional if you are unable to keep an appointment.  What may interact with this medicine?  Interactions are not expected.  This list may not describe all possible interactions. Give your health care provider a list of all the medicines, herbs, non-prescription drugs, or dietary supplements you use. Also tell them if you smoke, drink alcohol, or use illegal drugs. Some items may interact with your medicine.  What should I watch for while using this medicine?  Your condition will be monitored carefully while you are receiving  this medicine. You will need important blood work and urine testing done while you are taking this medicine.  This medicine may increase your risk to bruise or bleed. Call your doctor or health care professional if you notice any unusual bleeding.  This medicine should be started at least 28 days following major surgery and the site of the surgery should be totally healed. Check with your doctor before scheduling dental work or surgery while you are receiving this treatment. Talk to your doctor if you have recently had surgery or if you have a wound that has not healed.  Do not become pregnant while taking this medicine or for 6 months after stopping it. Women should inform their doctor if they wish to become pregnant or think they might be pregnant. There is a potential for serious side effects to an unborn child. Talk to your health care professional or pharmacist for more information. Do not breast-feed an infant while taking this medicine and for 6 months after the last dose.  This medicine has caused ovarian failure in some women. This medicine may interfere with the ability to have a child. You should talk to your doctor or health care professional if you are concerned about your fertility.  What side effects may I notice from receiving this medicine?  Side effects that you should report to your doctor or health care professional as soon as possible:  -allergic reactions like skin rash, itching or hives, swelling of the face, lips, or tongue  -  chest pain or chest tightness  -chills  -coughing up blood  -high fever  -seizures  -severe constipation  -signs and symptoms of bleeding such as bloody or black, tarry stools; red or dark-brown urine; spitting up blood or brown material that looks like coffee grounds; red spots on the skin; unusual bruising or bleeding from the eye, gums, or nose  -signs and symptoms of a blood clot such as breathing problems; chest pain; severe, sudden headache; pain, swelling, warmth  in the leg  -signs and symptoms of a stroke like changes in vision; confusion; trouble speaking or understanding; severe headaches; sudden numbness or weakness of the face, arm or leg; trouble walking; dizziness; loss of balance or coordination  -stomach pain  -sweating  -swelling of legs or ankles  -vomiting  -weight gain  Side effects that usually do not require medical attention (report to your doctor or health care professional if they continue or are bothersome):  -back pain  -changes in taste  -decreased appetite  -dry skin  -nausea  -tiredness  This list may not describe all possible side effects. Call your doctor for medical advice about side effects. You may report side effects to FDA at 1-800-FDA-1088.  Where should I keep my medicine?  This drug is given in a hospital or clinic and will not be stored at home.  NOTE: This sheet is a summary. It may not cover all possible information. If you have questions about this medicine, talk to your doctor, pharmacist, or health care provider.   2019 Elsevier/Gold Standard (2016-03-08 14:33:29)

## 2018-05-05 NOTE — Patient Instructions (Signed)
Implanted Port Insertion, Care After  This sheet gives you information about how to care for yourself after your procedure. Your health care provider may also give you more specific instructions. If you have problems or questions, contact your health care provider.  What can I expect after the procedure?  After the procedure, it is common to have:  · Discomfort at the port insertion site.  · Bruising on the skin over the port. This should improve over 3-4 days.  Follow these instructions at home:  Port care  · After your port is placed, you will get a manufacturer's information card. The card has information about your port. Keep this card with you at all times.  · Take care of the port as told by your health care provider. Ask your health care provider if you or a family member can get training for taking care of the port at home. A home health care nurse may also take care of the port.  · Make sure to remember what type of port you have.  Incision care         · Follow instructions from your health care provider about how to take care of your port insertion site. Make sure you:  ? Wash your hands with soap and water before and after you change your bandage (dressing). If soap and water are not available, use hand sanitizer.  ? Change your dressing as told by your health care provider.  ? Leave stitches (sutures), skin glue, or adhesive strips in place. These skin closures may need to stay in place for 2 weeks or longer. If adhesive strip edges start to loosen and curl up, you may trim the loose edges. Do not remove adhesive strips completely unless your health care provider tells you to do that.  · Check your port insertion site every day for signs of infection. Check for:  ? Redness, swelling, or pain.  ? Fluid or blood.  ? Warmth.  ? Pus or a bad smell.  Activity  · Return to your normal activities as told by your health care provider. Ask your health care provider what activities are safe for you.  · Do not  lift anything that is heavier than 10 lb (4.5 kg), or the limit that you are told, until your health care provider says that it is safe.  General instructions  · Take over-the-counter and prescription medicines only as told by your health care provider.  · Do not take baths, swim, or use a hot tub until your health care provider approves. Ask your health care provider if you may take showers. You may only be allowed to take sponge baths.  · Do not drive for 24 hours if you were given a sedative during your procedure.  · Wear a medical alert bracelet in case of an emergency. This will tell any health care providers that you have a port.  · Keep all follow-up visits as told by your health care provider. This is important.  Contact a health care provider if:  · You cannot flush your port with saline as directed, or you cannot draw blood from the port.  · You have a fever or chills.  · You have redness, swelling, or pain around your port insertion site.  · You have fluid or blood coming from your port insertion site.  · Your port insertion site feels warm to the touch.  · You have pus or a bad smell coming from the port   insertion site.  Get help right away if:  · You have chest pain or shortness of breath.  · You have bleeding from your port that you cannot control.  Summary  · Take care of the port as told by your health care provider. Keep the manufacturer's information card with you at all times.  · Change your dressing as told by your health care provider.  · Contact a health care provider if you have a fever or chills or if you have redness, swelling, or pain around your port insertion site.  · Keep all follow-up visits as told by your health care provider.  This information is not intended to replace advice given to you by your health care provider. Make sure you discuss any questions you have with your health care provider.  Document Released: 12/30/2012 Document Revised: 10/07/2017 Document Reviewed:  10/07/2017  Elsevier Interactive Patient Education © 2019 [employer]

## 2018-05-05 NOTE — Progress Notes (Signed)
Hematology and Oncology Follow Up Visit  Carla Mills [id number] [birth year] 48 y.o. 05/05/2018   Principle Diagnosis:  Recurrent colon cancer - HER2 (-)/KRAS mutant/MSI stable/MMR normal/BRAF (wt) -   Past Therapy: FOLFOXIRI - s/p cycle #12 -completed on 11/12/2016 Radiation therapy with Xeloda - completed on 06/06/2017 Status post exploratory laparotomy with HIPEC - 07/29/2016 FOLFOXIRI/Avastin - s/p cycle #8 (Avastin on hold)  Current Therapy:   Xeloda/Avastin - cycle 1 on 04/08/2018   Interim History:  Carla Mills is here today for follow-up and to resume treatment. Her urine protein today was negative. She states that last week she worked ou in the morning and did not hydrate well prior to coming in which may have contributed to her counts being a little off.  The cellulitis on the back of her right leg bellow the buttock is much better. The redness has resolved and she has the same 3 scabs that are drying out nicely.  She states that with the Doxycycline for her cellulitis she has also noted that the has been less urethral discharge.  No fever, chills, n/v, cough, dizziness, SOB, chest pain, palpitations, abdominal pain or changes in bowel or bladder habits.  No swelling or tenderness in her extremities at this time.  The neuropathy in her hands and feet is mild and comes and goes.  No lymphadenopathy noted on exam.  She has a good appetite and is staying well hydrated. Her weight is stable.   ECOG Performance Status: 1 - Symptomatic but completely ambulatory  Medications:  Allergies as of 05/05/2018      Reactions   Bactrim [sulfamethoxazole-trimethoprim] Swelling, Other (See Comments)   Severe generalized swelling   Capecitabine Hives, Other (See Comments)   Legs and arms, after completing therapy   Oxaprozin Hives   Day pro   Penicillins Hives, Other (See Comments)   Has patient had a PCN reaction causing immediate rash, facial/tongue/throat swelling,  SOB or lightheadedness with hypotension: no Has patient had a PCN reaction causing severe rash involving mucus membranes or skin necrosis: {no Has patient had a PCN reaction that required hospitalization no Has patient had a PCN reaction occurring within the last 10 years: no If all of the above answers are "NO", then may proceed with Cephalosporin use.      Medication List       Accurate as of May 05, 2018  9:31 AM. Always use your most recent med list.        capecitabine 500 MG tablet Commonly known as:  XELODA Take 5 tablets in the AM and 4 tablets in the PM, daily for 14 days   fluticasone 50 MCG/ACT nasal spray Commonly known as:  FLONASE Place 1 spray into both nostrils 2 (two) times daily.   traMADol 50 MG tablet Commonly known as:  ULTRAM Take 1 tablet (50 mg total) by mouth every 6 (six) hours as needed.       Allergies:  Allergies  Allergen Reactions  . Bactrim [Sulfamethoxazole-Trimethoprim] Swelling and Other (See Comments)    Severe generalized swelling  . Capecitabine Hives and Other (See Comments)    Legs and arms, after completing therapy  . Oxaprozin Hives    Day pro  . Penicillins Hives and Other (See Comments)    Has patient had a PCN reaction causing immediate rash, facial/tongue/throat swelling, SOB or lightheadedness with hypotension: no Has patient had a PCN reaction causing severe rash involving mucus membranes or skin necrosis: {no Has patient had a PCN reaction  that required hospitalization no Has patient had a PCN reaction occurring within the last 10 years: no If all of the above answers are "NO", then may proceed with Cephalosporin use.    Past Medical History, Surgical history, Social history, and Family History were reviewed and updated.  Review of Systems: All other 10 point review of systems is negative.   Physical Exam:  height is '5\' 5"'  (1.651 m) and weight is 159 lb (72.1 kg). Her oral temperature is 97.7 F (36.5 C). Her  blood pressure is 108/68 and her pulse is 82. Her respiration is 18 and oxygen saturation is 100%.   Wt Readings from Last 3 Encounters:  05/05/18 159 lb (72.1 kg)  04/28/18 159 lb (72.1 kg)  04/08/18 160 lb 1.6 oz (72.6 kg)    Ocular: Sclerae unicteric, pupils equal, round and reactive to light Ear-nose-throat: Oropharynx clear, dentition fair Lymphatic: No cervical, supraclavicular or axillary adenopathy Lungs no rales or rhonchi, good excursion bilaterally Heart regular rate and rhythm, no murmur appreciated Abd soft, nontender, positive bowel sounds, no liver or spleen tip palpated on exam, no fluid wave  MSK no focal spinal tenderness, no joint edema Neuro: non-focal, well-oriented, appropriate affect Breasts: Deferred   Lab Results  Component Value Date   WBC 5.6 05/05/2018   HGB 12.8 05/05/2018   HCT 38.5 05/05/2018   MCV 91.7 05/05/2018   PLT 242 05/05/2018   Lab Results  Component Value Date   FERRITIN 412 (H) 04/28/2018   IRON 29 (L) 04/28/2018   TIBC 289 04/28/2018   UIBC 260 04/28/2018   IRONPCTSAT 10 (L) 04/28/2018   Lab Results  Component Value Date   RETICCTPCT 0.8 02/12/2015   RBC 4.20 05/05/2018   No results found for: KPAFRELGTCHN, LAMBDASER, KAPLAMBRATIO No results found for: Kandis Cocking, IGMSERUM No results found for: Odetta Pink, SPEI   Chemistry      Component Value Date/Time   NA 140 05/05/2018 0800   NA 145 03/27/2017 1101   NA 141 07/30/2016 0750   K 4.0 05/05/2018 0800   K 4.5 03/27/2017 1101   K 4.2 07/30/2016 0750   CL 105 05/05/2018 0800   CL 103 03/27/2017 1101   CO2 26 05/05/2018 0800   CO2 28 03/27/2017 1101   CO2 26 07/30/2016 0750   BUN 20 05/05/2018 0800   BUN 12 03/27/2017 1101   BUN 12.1 07/30/2016 0750   CREATININE 0.82 05/05/2018 0800   CREATININE 0.9 03/27/2017 1101   CREATININE 0.7 07/30/2016 0750      Component Value Date/Time   CALCIUM 9.7 05/05/2018 0800    CALCIUM 9.6 03/27/2017 1101   CALCIUM 9.5 07/30/2016 0750   ALKPHOS 78 05/05/2018 0800   ALKPHOS 101 (H) 03/27/2017 1101   ALKPHOS 72 07/30/2016 0750   AST 16 05/05/2018 0800   AST 22 07/30/2016 0750   ALT 14 05/05/2018 0800   ALT 20 03/27/2017 1101   ALT 22 07/30/2016 0750   BILITOT 0.4 05/05/2018 0800   BILITOT 0.54 07/30/2016 0750       Impression and Plan: Carla Mills is a very pleasant 48 yo caucasian female with recurrent colon cancer - HER2 (-)/KRAS (+)/MSI stable/MMR normal/BRAF -.  Her counts today have returned to normal and she is doing well. We will proceed with treatment as planned.  She will restart her Xeloda today as well.  We will see her back in another 3 weeks for MD follow-up and treatment.  She will contact our office with any questions or concerns. We can certainly see her back sooner if need be.   Laverna Peace, NP 2/11/20209:31 AM

## 2018-05-14 DIAGNOSIS — Z85038 Personal history of other malignant neoplasm of large intestine: Secondary | ICD-10-CM | POA: Diagnosis not present

## 2018-05-14 DIAGNOSIS — Z933 Colostomy status: Secondary | ICD-10-CM | POA: Diagnosis not present

## 2018-05-19 ENCOUNTER — Ambulatory Visit: Payer: BLUE CROSS/BLUE SHIELD

## 2018-05-19 ENCOUNTER — Other Ambulatory Visit: Payer: BLUE CROSS/BLUE SHIELD

## 2018-05-19 ENCOUNTER — Ambulatory Visit: Payer: BLUE CROSS/BLUE SHIELD | Admitting: Hematology & Oncology

## 2018-05-26 ENCOUNTER — Inpatient Hospital Stay: Payer: BLUE CROSS/BLUE SHIELD | Attending: Radiation Oncology

## 2018-05-26 ENCOUNTER — Inpatient Hospital Stay: Payer: BLUE CROSS/BLUE SHIELD

## 2018-05-26 ENCOUNTER — Other Ambulatory Visit: Payer: Self-pay

## 2018-05-26 ENCOUNTER — Inpatient Hospital Stay (HOSPITAL_BASED_OUTPATIENT_CLINIC_OR_DEPARTMENT_OTHER): Payer: BLUE CROSS/BLUE SHIELD | Admitting: Hematology & Oncology

## 2018-05-26 ENCOUNTER — Encounter: Payer: Self-pay | Admitting: Hematology & Oncology

## 2018-05-26 VITALS — Wt 161.6 lb

## 2018-05-26 VITALS — BP 114/75 | HR 76 | Temp 97.6°F | Resp 16

## 2018-05-26 DIAGNOSIS — D509 Iron deficiency anemia, unspecified: Secondary | ICD-10-CM | POA: Insufficient documentation

## 2018-05-26 DIAGNOSIS — C189 Malignant neoplasm of colon, unspecified: Secondary | ICD-10-CM

## 2018-05-26 DIAGNOSIS — Z5112 Encounter for antineoplastic immunotherapy: Secondary | ICD-10-CM | POA: Insufficient documentation

## 2018-05-26 DIAGNOSIS — Z933 Colostomy status: Secondary | ICD-10-CM | POA: Diagnosis not present

## 2018-05-26 DIAGNOSIS — D500 Iron deficiency anemia secondary to blood loss (chronic): Secondary | ICD-10-CM

## 2018-05-26 DIAGNOSIS — D62 Acute posthemorrhagic anemia: Secondary | ICD-10-CM

## 2018-05-26 LAB — CMP (CANCER CENTER ONLY)
ALK PHOS: 83 U/L (ref 38–126)
ALT: 12 U/L (ref 0–44)
AST: 15 U/L (ref 15–41)
Albumin: 4.4 g/dL (ref 3.5–5.0)
Anion gap: 8 (ref 5–15)
BUN: 18 mg/dL (ref 6–20)
CALCIUM: 9.6 mg/dL (ref 8.9–10.3)
CO2: 27 mmol/L (ref 22–32)
CREATININE: 0.77 mg/dL (ref 0.44–1.00)
Chloride: 104 mmol/L (ref 98–111)
GFR, Est AFR Am: >60 mL/min (ref >60)
GFR, Estimated: >60 mL/min (ref >60)
Glucose, Bld: 86 mg/dL (ref 70–99)
Potassium: 4.1 mmol/L (ref 3.5–5.1)
Sodium: 139 mmol/L (ref 135–145)
Total Bilirubin: 0.7 mg/dL (ref 0.3–1.2)
Total Protein: 7 g/dL (ref 6.5–8.1)

## 2018-05-26 LAB — CBC WITH DIFFERENTIAL (CANCER CENTER ONLY)
Abs Immature Granulocytes: 0.01 10*3/uL (ref 0.00–0.07)
Basophils Absolute: 0 10*3/uL (ref 0.0–0.1)
Basophils Relative: 1 %
Eosinophils Absolute: 0.2 10*3/uL (ref 0.0–0.5)
Eosinophils Relative: 4 %
HCT: 36.9 % (ref 36.0–46.0)
Hemoglobin: 12.7 g/dL (ref 12.0–15.0)
Immature Granulocytes: 0 %
Lymphocytes Relative: 18 %
Lymphs Abs: 0.8 10*3/uL (ref 0.7–4.0)
MCH: 31.9 pg (ref 26.0–34.0)
MCHC: 34.4 g/dL (ref 30.0–36.0)
MCV: 92.7 fL (ref 80.0–100.0)
Monocytes Absolute: 0.5 10*3/uL (ref 0.1–1.0)
Monocytes Relative: 12 %
NEUTROS PCT: 65 %
Neutro Abs: 2.9 10*3/uL (ref 1.7–7.7)
Platelet Count: 254 10*3/uL (ref 150–400)
RBC: 3.98 MIL/uL (ref 3.87–5.11)
RDW: 16.2 % — ABNORMAL HIGH (ref 11.5–15.5)
WBC Count: 4.5 10*3/uL (ref 4.0–10.5)
nRBC: 0 % (ref 0.0–0.2)

## 2018-05-26 LAB — TOTAL PROTEIN, URINE DIPSTICK

## 2018-05-26 LAB — FERRITIN: Ferritin: 362 ng/mL — ABNORMAL HIGH (ref 11–307)

## 2018-05-26 LAB — IRON AND TIBC
Iron: 116 ug/dL (ref 41–142)
Saturation Ratios: 36 % (ref 21–57)
TIBC: 324 ug/dL (ref 236–444)
UIBC: 208 ug/dL (ref 120–384)

## 2018-05-26 MED ORDER — SODIUM CHLORIDE 0.9 % IV SOLN
7.5000 mg/kg | Freq: Once | INTRAVENOUS | Status: AC
  Administered 2018-05-26: 550 mg via INTRAVENOUS
  Filled 2018-05-26: qty 16

## 2018-05-26 MED ORDER — SODIUM CHLORIDE 0.9% FLUSH
10.0000 mL | INTRAVENOUS | Status: DC | PRN
  Administered 2018-05-26: 10 mL
  Filled 2018-05-26: qty 10

## 2018-05-26 MED ORDER — HEPARIN SOD (PORK) LOCK FLUSH 100 UNIT/ML IV SOLN
500.0000 [IU] | Freq: Once | INTRAVENOUS | Status: AC | PRN
  Administered 2018-05-26: 500 [IU]
  Filled 2018-05-26: qty 5

## 2018-05-26 MED ORDER — SODIUM CHLORIDE 0.9 % IV SOLN
750.0000 mg | Freq: Once | INTRAVENOUS | Status: AC
  Administered 2018-05-26: 750 mg via INTRAVENOUS
  Filled 2018-05-26: qty 15

## 2018-05-26 MED ORDER — SODIUM CHLORIDE 0.9 % IV SOLN
Freq: Once | INTRAVENOUS | Status: AC
  Administered 2018-05-26: 09:00:00 via INTRAVENOUS
  Filled 2018-05-26: qty 250

## 2018-05-26 NOTE — Progress Notes (Signed)
Hematology and Oncology Follow Up Visit  Carla Mills [id number] 1971-03-02 48 y.o. 05/26/2018   Principle Diagnosis:  Recurrent colon cancer - HER2 (-)/KRAS mutant/MSI stable/MMR normal/BRAF (wt) -   Past Therapy: FOLFOXIRI - s/p cycle #12 -completed on 11/12/2016 Radiation therapy with Xeloda - completed on 06/06/2017 Status post exploratory laparotomy with HIPEC - 07/29/2016 FOLFOXIRI/Avastin - s/p cycle #8 (Avastin on hold)  Current Therapy:   Xeloda/Avastin - cycle #2 on 04/08/2018   Interim History:  Carla Mills is here today for follow-up.  She is doing quite well.  She and her husband have been traveling to see 1 of their daughters who plays golf in college.  Unfortunately, the daughter also had a car accident last night.  She ran into another car.  Thankfully she is okay.  She has had no issues with the Xeloda although she says that on the 14-day, she begins to have problems with vomiting.  As such, I told her that she could just take 13 days of Xeloda.  She has had no issues with her colostomy.  There is no diarrhea.  She has had no cough.  She has had no mouth sores.  She has had her tongue is sore.  I told her to try rinsing her mouth out with milk of magnesia.  I think this would be reasonable to try.  I know this sounds quite unusual but I think it would be reasonable.  She has had no pain.  There is been no bleeding.  Her hands and feet are doing okay.  They are a little red.  She does apply quite a bit of lotion.  She has had no fever.  Overall, her performance status is ECOG 1.  Medications:  Allergies as of 05/26/2018      Reactions   Bactrim [sulfamethoxazole-trimethoprim] Swelling, Other (See Comments)   Severe generalized swelling   Capecitabine Hives, Other (See Comments)   Legs and arms, after completing therapy   Oxaprozin Hives   Day pro   Penicillins Hives, Other (See Comments)   Has patient had a PCN reaction causing immediate  rash, facial/tongue/throat swelling, SOB or lightheadedness with hypotension: no Has patient had a PCN reaction causing severe rash involving mucus membranes or skin necrosis: {no Has patient had a PCN reaction that required hospitalization no Has patient had a PCN reaction occurring within the last 10 years: no If all of the above answers are "NO", then may proceed with Cephalosporin use.      Medication List       Accurate as of May 26, 2018  7:43 AM. Always use your most recent med list.        capecitabine 500 MG tablet Commonly known as:  XELODA Take 5 tablets in the AM and 4 tablets in the PM, daily for 14 days   fluticasone 50 MCG/ACT nasal spray Commonly known as:  FLONASE Place 1 spray into both nostrils 2 (two) times daily.   traMADol 50 MG tablet Commonly known as:  ULTRAM Take 1 tablet (50 mg total) by mouth every 6 (six) hours as needed.       Allergies:  Allergies  Allergen Reactions  . Bactrim [Sulfamethoxazole-Trimethoprim] Swelling and Other (See Comments)    Severe generalized swelling  . Capecitabine Hives and Other (See Comments)    Legs and arms, after completing therapy  . Oxaprozin Hives    Day pro  . Penicillins Hives and Other (See Comments)    Has patient had a  PCN reaction causing immediate rash, facial/tongue/throat swelling, SOB or lightheadedness with hypotension: no Has patient had a PCN reaction causing severe rash involving mucus membranes or skin necrosis: {no Has patient had a PCN reaction that required hospitalization no Has patient had a PCN reaction occurring within the last 10 years: no If all of the above answers are "NO", then may proceed with Cephalosporin use.    Past Medical History, Surgical history, Social history, and Family History were reviewed and updated.  Review of Systems: Review of Systems  Constitutional: Negative.   HENT: Negative.   Eyes: Negative.   Respiratory: Negative.   Cardiovascular: Negative.     Gastrointestinal: Negative.   Genitourinary: Negative.   Musculoskeletal: Negative.   Skin: Negative.   Neurological: Negative.   Endo/Heme/Allergies: Negative.   Psychiatric/Behavioral: Negative.       Physical Exam:  vitals were not taken for this visit.   Wt Readings from Last 3 Encounters:  05/05/18 159 lb (72.1 kg)  04/28/18 159 lb (72.1 kg)  04/08/18 160 lb 1.6 oz (72.6 kg)    Her vital signs show a temperature 97.6.  Pulse 76.  Blood pressure 114/75.  Physical Exam Vitals signs reviewed.  HENT:     Head: Normocephalic and atraumatic.  Eyes:     Pupils: Pupils are equal, round, and reactive to light.  Neck:     Musculoskeletal: Normal range of motion.  Cardiovascular:     Rate and Rhythm: Normal rate and regular rhythm.     Heart sounds: Normal heart sounds.  Pulmonary:     Effort: Pulmonary effort is normal.     Breath sounds: Normal breath sounds.  Abdominal:     General: Bowel sounds are normal.     Palpations: Abdomen is soft.     Comments: Abdominal exam shows laparotomy scars that are well-healed.  She has a colostomy in the left lower quadrant.  She has no fluid wave.  There is no guarding or rebound tenderness.  She has no palpable liver or spleen tip.  Musculoskeletal: Normal range of motion.        General: No tenderness or deformity.  Lymphadenopathy:     Cervical: No cervical adenopathy.  Skin:    General: Skin is warm and dry.     Findings: No erythema or rash.  Neurological:     Mental Status: She is alert and oriented to person, place, and time.  Psychiatric:        Behavior: Behavior normal.        Thought Content: Thought content normal.        Judgment: Judgment normal.      Lab Results  Component Value Date   WBC 5.6 05/05/2018   HGB 12.8 05/05/2018   HCT 38.5 05/05/2018   MCV 91.7 05/05/2018   PLT 242 05/05/2018   Lab Results  Component Value Date   FERRITIN 343 (H) 05/05/2018   IRON 111 05/05/2018   TIBC 296 05/05/2018    UIBC 185 05/05/2018   IRONPCTSAT 37 05/05/2018   Lab Results  Component Value Date   RETICCTPCT 0.8 02/12/2015   RBC 4.20 05/05/2018   No results found for: KPAFRELGTCHN, LAMBDASER, KAPLAMBRATIO No results found for: IGGSERUM, IGA, IGMSERUM No results found for: TOTALPROTELP, ALBUMINELP, A1GS, A2GS, BETS, BETA2SER, GAMS, MSPIKE, SPEI   Chemistry      Component Value Date/Time   NA 140 05/05/2018 0800   NA 145 03/27/2017 1101   NA 141 07/30/2016 0750   K  4.0 05/05/2018 0800   K 4.5 03/27/2017 1101   K 4.2 07/30/2016 0750   CL 105 05/05/2018 0800   CL 103 03/27/2017 1101   CO2 26 05/05/2018 0800   CO2 28 03/27/2017 1101   CO2 26 07/30/2016 0750   BUN 20 05/05/2018 0800   BUN 12 03/27/2017 1101   BUN 12.1 07/30/2016 0750   CREATININE 0.82 05/05/2018 0800   CREATININE 0.9 03/27/2017 1101   CREATININE 0.7 07/30/2016 0750      Component Value Date/Time   CALCIUM 9.7 05/05/2018 0800   CALCIUM 9.6 03/27/2017 1101   CALCIUM 9.5 07/30/2016 0750   ALKPHOS 78 05/05/2018 0800   ALKPHOS 101 (H) 03/27/2017 1101   ALKPHOS 72 07/30/2016 0750   AST 16 05/05/2018 0800   AST 22 07/30/2016 0750   ALT 14 05/05/2018 0800   ALT 20 03/27/2017 1101   ALT 22 07/30/2016 0750   BILITOT 0.4 05/05/2018 0800   BILITOT 0.54 07/30/2016 0750       Impression and Plan: [name] is a very pleasant 48 yo caucasian female with recurrent colon cancer - HER2 (-)/KRAS (+)/MSI stable/MMR normal/BRAF -.   He now has been 6 months since she had her surgery.  She really has done well.  We will proceed with her third cycle of treatment.  After her fourth, we will then plan for a scan to see how everything looks.  I am sure that at some point, her disease will come back.  We have done everything possible to minimize that risk.  Hopefully, she will have a prolonged disease-free interval.  We will plan to get her back to see [place] in another 3 weeks.  Again, after her fourth cycle, we will do her  scans.  Hopefully, the 13 days of Xeloda will help with her vomiting. Her counts today have returned to normal and she is doing well. We will proceed with treatment as planned.  She will restart her Xeloda today as well.  We will see her back in another 3 weeks for MD follow-up and treatment.  She will contact our office with any questions or concerns. We can certainly see her back sooner if need be.   Volanda Napoleon, MD 3/3/20207:43 AM

## 2018-05-26 NOTE — Patient Instructions (Signed)
Bevacizumab injection What is this medicine? BEVACIZUMAB (be va SIZ yoo mab) is a monoclonal antibody. It is used to treat many types of cancer. This medicine may be used for other purposes; ask your health care provider or pharmacist if you have questions. COMMON BRAND NAME(S): Avastin, MVASI What should I tell my health care provider before I take this medicine? They need to know if you have any of these conditions: -diabetes -heart disease -high blood pressure -history of coughing up blood -prior anthracycline chemotherapy (e.g., doxorubicin, daunorubicin, epirubicin) -recent or ongoing radiation therapy -recent or planning to have surgery -stroke -an unusual or allergic reaction to bevacizumab, hamster proteins, mouse proteins, other medicines, foods, dyes, or preservatives -pregnant or trying to get pregnant -breast-feeding How should I use this medicine? This medicine is for infusion into a vein. It is given by a health care professional in a hospital or clinic setting. Talk to your pediatrician regarding the use of this medicine in children. Special care may be needed. Overdosage: If you think you have taken too much of this medicine contact a poison control center or emergency room at once. NOTE: This medicine is only for you. Do not share this medicine with others. What if I miss a dose? It is important not to miss your dose. Call your doctor or health care professional if you are unable to keep an appointment. What may interact with this medicine? Interactions are not expected. This list may not describe all possible interactions. Give your health care provider a list of all the medicines, herbs, non-prescription drugs, or dietary supplements you use. Also tell them if you smoke, drink alcohol, or use illegal drugs. Some items may interact with your medicine. What should I watch for while using this medicine? Your condition will be monitored carefully while you are receiving  this medicine. You will need important blood work and urine testing done while you are taking this medicine. This medicine may increase your risk to bruise or bleed. Call your doctor or health care professional if you notice any unusual bleeding. This medicine should be started at least 28 days following major surgery and the site of the surgery should be totally healed. Check with your doctor before scheduling dental work or surgery while you are receiving this treatment. Talk to your doctor if you have recently had surgery or if you have a wound that has not healed. Do not become pregnant while taking this medicine or for 6 months after stopping it. Women should inform their doctor if they wish to become pregnant or think they might be pregnant. There is a potential for serious side effects to an unborn child. Talk to your health care professional or pharmacist for more information. Do not breast-feed an infant while taking this medicine and for 6 months after the last dose. This medicine has caused ovarian failure in some women. This medicine may interfere with the ability to have a child. You should talk to your doctor or health care professional if you are concerned about your fertility. What side effects may I notice from receiving this medicine? Side effects that you should report to your doctor or health care professional as soon as possible: -allergic reactions like skin rash, itching or hives, swelling of the face, lips, or tongue -chest pain or chest tightness -chills -coughing up blood -high fever -seizures -severe constipation -signs and symptoms of bleeding such as bloody or black, tarry stools; red or dark-brown urine; spitting up blood or brown material that  looks like coffee grounds; red spots on the skin; unusual bruising or bleeding from the eye, gums, or nose -signs and symptoms of a blood clot such as breathing problems; chest pain; severe, sudden headache; pain, swelling, warmth  in the leg -signs and symptoms of a stroke like changes in vision; confusion; trouble speaking or understanding; severe headaches; sudden numbness or weakness of the face, arm or leg; trouble walking; dizziness; loss of balance or coordination -stomach pain -sweating -swelling of legs or ankles -vomiting -weight gain Side effects that usually do not require medical attention (report to your doctor or health care professional if they continue or are bothersome): -back pain -changes in taste -decreased appetite -dry skin -nausea -tiredness This list may not describe all possible side effects. Call your doctor for medical advice about side effects. You may report side effects to FDA at 1-800-FDA-1088. Where should I keep my medicine? This drug is given in a hospital or clinic and will not be stored at home. NOTE: This sheet is a summary. It may not cover all possible information. If you have questions about this medicine, talk to your doctor, pharmacist, or health care provider.  2019 Elsevier/Gold Standard (2016-03-08 14:33:29) Ferric carboxymaltose injection What is this medicine? FERRIC CARBOXYMALTOSE (ferr-ik car-box-ee-mol-toes) is an iron complex. Iron is used to make healthy red blood cells, which carry oxygen and nutrients throughout the body. This medicine is used to treat anemia in people with chronic kidney disease or people who cannot take iron by mouth. This medicine may be used for other purposes; ask your health care provider or pharmacist if you have questions. COMMON BRAND NAME(S): Injectafer What should I tell my health care provider before I take this medicine? They need to know if you have any of these conditions: -high levels of iron in the blood -liver disease -an unusual or allergic reaction to iron, other medicines, foods, dyes, or preservatives -pregnant or trying to get pregnant -breast-feeding How should I use this medicine? This medicine is for infusion into  a vein. It is given by a health care professional in a hospital or clinic setting. Talk to your pediatrician regarding the use of this medicine in children. Special care may be needed. Overdosage: If you think you have taken too much of this medicine contact a poison control center or emergency room at once. NOTE: This medicine is only for you. Do not share this medicine with others. What if I miss a dose? It is important not to miss your dose. Call your doctor or health care professional if you are unable to keep an appointment. What may interact with this medicine? Do not take this medicine with any of the following medications: -deferoxamine -dimercaprol -other iron products This list may not describe all possible interactions. Give your health care provider a list of all the medicines, herbs, non-prescription drugs, or dietary supplements you use. Also tell them if you smoke, drink alcohol, or use illegal drugs. Some items may interact with your medicine. What should I watch for while using this medicine? Visit your doctor or health care professional regularly. Tell your doctor if your symptoms do not start to get better or if they get worse. You may need blood work done while you are taking this medicine. You may need to follow a special diet. Talk to your doctor. Foods that contain iron include: whole grains/cereals, dried fruits, beans, or peas, leafy green vegetables, and organ meats (liver, kidney). What side effects may I notice from receiving  this medicine? Side effects that you should report to your doctor or health care professional as soon as possible: -allergic reactions like skin rash, itching or hives, swelling of the face, lips, or tongue -dizziness -facial flushing Side effects that usually do not require medical attention (report to your doctor or health care professional if they continue or are bothersome): -changes in taste -constipation -headache -nausea,  vomiting -pain, redness, or irritation at site where injected This list may not describe all possible side effects. Call your doctor for medical advice about side effects. You may report side effects to FDA at 1-800-FDA-1088. Where should I keep my medicine? This drug is given in a hospital or clinic and will not be stored at home. NOTE: This sheet is a summary. It may not cover all possible information. If you have questions about this medicine, talk to your doctor, pharmacist, or health care provider.  2019 Elsevier/Gold Standard (2016-04-25 09:40:29)

## 2018-05-26 NOTE — Progress Notes (Signed)
9:01 AM Ok to treat with Avastin with total protein of 1+ per Dr. Marin Olp.

## 2018-05-26 NOTE — Patient Instructions (Signed)
Implanted Port Insertion, Care After  This sheet gives you information about how to care for yourself after your procedure. Your health care provider may also give you more specific instructions. If you have problems or questions, contact your health care provider.  What can I expect after the procedure?  After the procedure, it is common to have:  · Discomfort at the port insertion site.  · Bruising on the skin over the port. This should improve over 3-4 days.  Follow these instructions at home:  Port care  · After your port is placed, you will get a manufacturer's information card. The card has information about your port. Keep this card with you at all times.  · Take care of the port as told by your health care provider. Ask your health care provider if you or a family member can get training for taking care of the port at home. A home health care nurse may also take care of the port.  · Make sure to remember what type of port you have.  Incision care         · Follow instructions from your health care provider about how to take care of your port insertion site. Make sure you:  ? Wash your hands with soap and water before and after you change your bandage (dressing). If soap and water are not available, use hand sanitizer.  ? Change your dressing as told by your health care provider.  ? Leave stitches (sutures), skin glue, or adhesive strips in place. These skin closures may need to stay in place for 2 weeks or longer. If adhesive strip edges start to loosen and curl up, you may trim the loose edges. Do not remove adhesive strips completely unless your health care provider tells you to do that.  · Check your port insertion site every day for signs of infection. Check for:  ? Redness, swelling, or pain.  ? Fluid or blood.  ? Warmth.  ? Pus or a bad smell.  Activity  · Return to your normal activities as told by your health care provider. Ask your health care provider what activities are safe for you.  · Do not  lift anything that is heavier than 10 lb (4.5 kg), or the limit that you are told, until your health care provider says that it is safe.  General instructions  · Take over-the-counter and prescription medicines only as told by your health care provider.  · Do not take baths, swim, or use a hot tub until your health care provider approves. Ask your health care provider if you may take showers. You may only be allowed to take sponge baths.  · Do not drive for 24 hours if you were given a sedative during your procedure.  · Wear a medical alert bracelet in case of an emergency. This will tell any health care providers that you have a port.  · Keep all follow-up visits as told by your health care provider. This is important.  Contact a health care provider if:  · You cannot flush your port with saline as directed, or you cannot draw blood from the port.  · You have a fever or chills.  · You have redness, swelling, or pain around your port insertion site.  · You have fluid or blood coming from your port insertion site.  · Your port insertion site feels warm to the touch.  · You have pus or a bad smell coming from the port   insertion site.  Get help right away if:  · You have chest pain or shortness of breath.  · You have bleeding from your port that you cannot control.  Summary  · Take care of the port as told by your health care provider. Keep the manufacturer's information card with you at all times.  · Change your dressing as told by your health care provider.  · Contact a health care provider if you have a fever or chills or if you have redness, swelling, or pain around your port insertion site.  · Keep all follow-up visits as told by your health care provider.  This information is not intended to replace advice given to you by your health care provider. Make sure you discuss any questions you have with your health care provider.  Document Released: 12/30/2012 Document Revised: 10/07/2017 Document Reviewed:  10/07/2017  Elsevier Interactive Patient Education © 2019 [employer]

## 2018-06-01 DIAGNOSIS — M62830 Muscle spasm of back: Secondary | ICD-10-CM | POA: Diagnosis not present

## 2018-06-01 DIAGNOSIS — M9904 Segmental and somatic dysfunction of sacral region: Secondary | ICD-10-CM | POA: Diagnosis not present

## 2018-06-01 DIAGNOSIS — M545 Low back pain: Secondary | ICD-10-CM | POA: Diagnosis not present

## 2018-06-01 DIAGNOSIS — M9903 Segmental and somatic dysfunction of lumbar region: Secondary | ICD-10-CM | POA: Diagnosis not present

## 2018-06-15 ENCOUNTER — Telehealth: Payer: Self-pay | Admitting: Hematology & Oncology

## 2018-06-15 NOTE — Telephone Encounter (Signed)
Called and pre screened patient NO SYMPTOMS-and aware NO VISITORS

## 2018-06-16 ENCOUNTER — Inpatient Hospital Stay: Payer: BLUE CROSS/BLUE SHIELD

## 2018-06-16 ENCOUNTER — Other Ambulatory Visit: Payer: Self-pay

## 2018-06-16 ENCOUNTER — Inpatient Hospital Stay (HOSPITAL_BASED_OUTPATIENT_CLINIC_OR_DEPARTMENT_OTHER): Payer: BLUE CROSS/BLUE SHIELD | Admitting: Hematology & Oncology

## 2018-06-16 VITALS — BP 117/86 | HR 79 | Temp 97.6°F | Resp 20 | Wt 164.2 lb

## 2018-06-16 DIAGNOSIS — Z5112 Encounter for antineoplastic immunotherapy: Secondary | ICD-10-CM | POA: Diagnosis not present

## 2018-06-16 DIAGNOSIS — C189 Malignant neoplasm of colon, unspecified: Secondary | ICD-10-CM

## 2018-06-16 DIAGNOSIS — Z933 Colostomy status: Secondary | ICD-10-CM

## 2018-06-16 DIAGNOSIS — D509 Iron deficiency anemia, unspecified: Secondary | ICD-10-CM | POA: Diagnosis not present

## 2018-06-16 LAB — CBC WITH DIFFERENTIAL (CANCER CENTER ONLY)
Abs Immature Granulocytes: 0 10*3/uL (ref 0.00–0.07)
Basophils Absolute: 0 10*3/uL (ref 0.0–0.1)
Basophils Relative: 0 %
EOS ABS: 0.2 10*3/uL (ref 0.0–0.5)
EOS PCT: 4 %
HCT: 38.8 % (ref 36.0–46.0)
Hemoglobin: 13.1 g/dL (ref 12.0–15.0)
Immature Granulocytes: 0 %
Lymphocytes Relative: 15 %
Lymphs Abs: 0.8 10*3/uL (ref 0.7–4.0)
MCH: 32.8 pg (ref 26.0–34.0)
MCHC: 33.8 g/dL (ref 30.0–36.0)
MCV: 97 fL (ref 80.0–100.0)
Monocytes Absolute: 0.5 10*3/uL (ref 0.1–1.0)
Monocytes Relative: 9 %
NRBC: 0 % (ref 0.0–0.2)
Neutro Abs: 4.1 10*3/uL (ref 1.7–7.7)
Neutrophils Relative %: 72 %
Platelet Count: 233 10*3/uL (ref 150–400)
RBC: 4 MIL/uL (ref 3.87–5.11)
RDW: 18.5 % — AB (ref 11.5–15.5)
WBC Count: 5.7 10*3/uL (ref 4.0–10.5)

## 2018-06-16 LAB — CMP (CANCER CENTER ONLY)
ALT: 17 U/L (ref 0–44)
ANION GAP: 8 (ref 5–15)
AST: 19 U/L (ref 15–41)
Albumin: 4.5 g/dL (ref 3.5–5.0)
Alkaline Phosphatase: 80 U/L (ref 38–126)
BUN: 16 mg/dL (ref 6–20)
CO2: 27 mmol/L (ref 22–32)
Calcium: 9.3 mg/dL (ref 8.9–10.3)
Chloride: 104 mmol/L (ref 98–111)
Creatinine: 0.81 mg/dL (ref 0.44–1.00)
GFR, Est AFR Am: >60 mL/min (ref >60)
GFR, Estimated: >60 mL/min (ref >60)
GLUCOSE: 99 mg/dL (ref 70–99)
Potassium: 4 mmol/L (ref 3.5–5.1)
Sodium: 139 mmol/L (ref 135–145)
TOTAL PROTEIN: 6.6 g/dL (ref 6.5–8.1)
Total Bilirubin: 0.9 mg/dL (ref 0.3–1.2)

## 2018-06-16 LAB — CEA (IN HOUSE-CHCC): CEA (CHCC-In House): 3.08 ng/mL (ref 0.00–5.00)

## 2018-06-16 LAB — IRON AND TIBC
Iron: 139 ug/dL (ref 41–142)
Saturation Ratios: 43 % (ref 21–57)
TIBC: 321 ug/dL (ref 236–444)
UIBC: 182 ug/dL (ref 120–384)

## 2018-06-16 LAB — FERRITIN: Ferritin: 784 ng/mL — ABNORMAL HIGH (ref 11–307)

## 2018-06-16 MED ORDER — HEPARIN SOD (PORK) LOCK FLUSH 100 UNIT/ML IV SOLN
500.0000 [IU] | Freq: Once | INTRAVENOUS | Status: AC | PRN
  Administered 2018-06-16: 500 [IU]
  Filled 2018-06-16: qty 5

## 2018-06-16 MED ORDER — SODIUM CHLORIDE 0.9% FLUSH
10.0000 mL | INTRAVENOUS | Status: DC | PRN
  Administered 2018-06-16: 10 mL
  Filled 2018-06-16: qty 10

## 2018-06-16 MED ORDER — SODIUM CHLORIDE 0.9 % IV SOLN
Freq: Once | INTRAVENOUS | Status: AC
  Administered 2018-06-16: 10:00:00 via INTRAVENOUS
  Filled 2018-06-16: qty 250

## 2018-06-16 MED ORDER — SODIUM CHLORIDE 0.9 % IV SOLN
7.5000 mg/kg | Freq: Once | INTRAVENOUS | Status: AC
  Administered 2018-06-16: 550 mg via INTRAVENOUS
  Filled 2018-06-16: qty 16

## 2018-06-16 NOTE — Patient Instructions (Signed)
Bevacizumab injection  What is this medicine?  BEVACIZUMAB (be va SIZ yoo mab) is a monoclonal antibody. It is used to treat many types of cancer.  This medicine may be used for other purposes; ask your health care provider or pharmacist if you have questions.  COMMON BRAND NAME(S): Avastin, MVASI  What should I tell my health care provider before I take this medicine?  They need to know if you have any of these conditions:  -diabetes  -heart disease  -high blood pressure  -history of coughing up blood  -prior anthracycline chemotherapy (e.g., doxorubicin, daunorubicin, epirubicin)  -recent or ongoing radiation therapy  -recent or planning to have surgery  -stroke  -an unusual or allergic reaction to bevacizumab, hamster proteins, mouse proteins, other medicines, foods, dyes, or preservatives  -pregnant or trying to get pregnant  -breast-feeding  How should I use this medicine?  This medicine is for infusion into a vein. It is given by a health care professional in a hospital or clinic setting.  Talk to your pediatrician regarding the use of this medicine in children. Special care may be needed.  Overdosage: If you think you have taken too much of this medicine contact a poison control center or emergency room at once.  NOTE: This medicine is only for you. Do not share this medicine with others.  What if I miss a dose?  It is important not to miss your dose. Call your doctor or health care professional if you are unable to keep an appointment.  What may interact with this medicine?  Interactions are not expected.  This list may not describe all possible interactions. Give your health care provider a list of all the medicines, herbs, non-prescription drugs, or dietary supplements you use. Also tell them if you smoke, drink alcohol, or use illegal drugs. Some items may interact with your medicine.  What should I watch for while using this medicine?  Your condition will be monitored carefully while you are receiving  this medicine. You will need important blood work and urine testing done while you are taking this medicine.  This medicine may increase your risk to bruise or bleed. Call your doctor or health care professional if you notice any unusual bleeding.  This medicine should be started at least 28 days following major surgery and the site of the surgery should be totally healed. Check with your doctor before scheduling dental work or surgery while you are receiving this treatment. Talk to your doctor if you have recently had surgery or if you have a wound that has not healed.  Do not become pregnant while taking this medicine or for 6 months after stopping it. Women should inform their doctor if they wish to become pregnant or think they might be pregnant. There is a potential for serious side effects to an unborn child. Talk to your health care professional or pharmacist for more information. Do not breast-feed an infant while taking this medicine and for 6 months after the last dose.  This medicine has caused ovarian failure in some women. This medicine may interfere with the ability to have a child. You should talk to your doctor or health care professional if you are concerned about your fertility.  What side effects may I notice from receiving this medicine?  Side effects that you should report to your doctor or health care professional as soon as possible:  -allergic reactions like skin rash, itching or hives, swelling of the face, lips, or tongue  -  chest pain or chest tightness  -chills  -coughing up blood  -high fever  -seizures  -severe constipation  -signs and symptoms of bleeding such as bloody or black, tarry stools; red or dark-brown urine; spitting up blood or brown material that looks like coffee grounds; red spots on the skin; unusual bruising or bleeding from the eye, gums, or nose  -signs and symptoms of a blood clot such as breathing problems; chest pain; severe, sudden headache; pain, swelling, warmth  in the leg  -signs and symptoms of a stroke like changes in vision; confusion; trouble speaking or understanding; severe headaches; sudden numbness or weakness of the face, arm or leg; trouble walking; dizziness; loss of balance or coordination  -stomach pain  -sweating  -swelling of legs or ankles  -vomiting  -weight gain  Side effects that usually do not require medical attention (report to your doctor or health care professional if they continue or are bothersome):  -back pain  -changes in taste  -decreased appetite  -dry skin  -nausea  -tiredness  This list may not describe all possible side effects. Call your doctor for medical advice about side effects. You may report side effects to FDA at 1-800-FDA-1088.  Where should I keep my medicine?  This drug is given in a hospital or clinic and will not be stored at home.  NOTE: This sheet is a summary. It may not cover all possible information. If you have questions about this medicine, talk to your doctor, pharmacist, or health care provider.   2019 Elsevier/Gold Standard (2016-03-08 14:33:29)

## 2018-06-16 NOTE — Addendum Note (Signed)
Addended by: Burney Gauze R on: 06/16/2018 09:30 AM   Modules accepted: Orders

## 2018-06-16 NOTE — Progress Notes (Signed)
Hematology and Oncology Follow Up Visit  Carla Mills [id number] [birth year] 48 y.o. 06/16/2018   Principle Diagnosis:  Recurrent colon cancer - HER2 (-)/KRAS mutant/MSI stable/MMR normal/BRAF (wt) -   Past Therapy: FOLFOXIRI - s/p cycle #12 -completed on 11/12/2016 Radiation therapy with Xeloda - completed on 06/06/2017 Status post exploratory laparotomy with HIPEC - 07/29/2016 FOLFOXIRI/Avastin - s/p cycle #8 (Avastin on hold)  Current Therapy:   Xeloda/Avastin - cycle #3 on 04/08/2018   Interim History:  Ms. Minnis is here today for follow-up.  The coronavirus has really upset her family.  Her 3 daughters are in from school.  1 from college and one is a Equities trader in high school.  This really has affected her.  She has not gone to have a senior prom nor will she have high school graduation.  This really is a tragedy.  She is doing well with the Xeloda.  She is on the Xeloda for 13 days.  This has been a bit better for her.  She still has some PPE on her hands and feet.  She is still doing a lot of walking.  She is putting ice on her feet when she is back from walking and this is helped.  She has had no problems with her colostomy.  She has had no bleeding.  There is been no pain.  She has had no cough or shortness of breath.  She is had no fever.  Overall, her performance status is ECOG 1.  Medications:  Allergies as of 06/16/2018      Reactions   Bactrim [sulfamethoxazole-trimethoprim] Swelling, Other (See Comments)   Severe generalized swelling   Capecitabine Hives, Other (See Comments)   Legs and arms, after completing therapy   Oxaprozin Hives   Day pro   Penicillins Hives, Other (See Comments)   Has patient had a PCN reaction causing immediate rash, facial/tongue/throat swelling, SOB or lightheadedness with hypotension: no Has patient had a PCN reaction causing severe rash involving mucus membranes or skin necrosis: {no Has patient had a PCN reaction that  required hospitalization no Has patient had a PCN reaction occurring within the last 10 years: no If all of the above answers are "NO", then may proceed with Cephalosporin use.      Medication List       Accurate as of June 16, 2018  8:42 AM. Always use your most recent med list.        capecitabine 500 MG tablet Commonly known as:  XELODA Take 5 tablets in the AM and 4 tablets in the PM, daily for 14 days   fluticasone 50 MCG/ACT nasal spray Commonly known as:  FLONASE Place 1 spray into both nostrils 2 (two) times daily.   traMADol 50 MG tablet Commonly known as:  ULTRAM Take 1 tablet (50 mg total) by mouth every 6 (six) hours as needed.       Allergies:  Allergies  Allergen Reactions  . Bactrim [Sulfamethoxazole-Trimethoprim] Swelling and Other (See Comments)    Severe generalized swelling  . Capecitabine Hives and Other (See Comments)    Legs and arms, after completing therapy  . Oxaprozin Hives    Day pro  . Penicillins Hives and Other (See Comments)    Has patient had a PCN reaction causing immediate rash, facial/tongue/throat swelling, SOB or lightheadedness with hypotension: no Has patient had a PCN reaction causing severe rash involving mucus membranes or skin necrosis: {no Has patient had a PCN reaction that required hospitalization no Has  patient had a PCN reaction occurring within the last 10 years: no If all of the above answers are "NO", then may proceed with Cephalosporin use.    Past Medical History, Surgical history, Social history, and Family History were reviewed and updated.  Review of Systems: Review of Systems  Constitutional: Negative.   HENT: Negative.   Eyes: Negative.   Respiratory: Negative.   Cardiovascular: Negative.   Gastrointestinal: Negative.   Genitourinary: Negative.   Musculoskeletal: Negative.   Skin: Negative.   Neurological: Negative.   Endo/Heme/Allergies: Negative.   Psychiatric/Behavioral: Negative.        Physical Exam:  weight is 164 lb 4 oz (74.5 kg). Her oral temperature is 97.6 F (36.4 C). Her blood pressure is 117/86 and her pulse is 79. Her respiration is 20 and oxygen saturation is 100%.   Wt Readings from Last 3 Encounters:  06/16/18 164 lb 4 oz (74.5 kg)  05/26/18 161 lb 9.6 oz (73.3 kg)  05/05/18 159 lb (72.1 kg)    Her vital signs show a temperature 97.6.  Pulse 76.  Blood pressure 114/75.  Physical Exam Vitals signs reviewed.  HENT:     Head: Normocephalic and atraumatic.  Eyes:     Pupils: Pupils are equal, round, and reactive to light.  Neck:     Musculoskeletal: Normal range of motion.  Cardiovascular:     Rate and Rhythm: Normal rate and regular rhythm.     Heart sounds: Normal heart sounds.  Pulmonary:     Effort: Pulmonary effort is normal.     Breath sounds: Normal breath sounds.  Abdominal:     General: Bowel sounds are normal.     Palpations: Abdomen is soft.     Comments: Abdominal exam shows laparotomy scars that are well-healed.  She has a colostomy in the left lower quadrant.  She has no fluid wave.  There is no guarding or rebound tenderness.  She has no palpable liver or spleen tip.  Musculoskeletal: Normal range of motion.        General: No tenderness or deformity.  Lymphadenopathy:     Cervical: No cervical adenopathy.  Skin:    General: Skin is warm and dry.     Findings: No erythema or rash.  Neurological:     Mental Status: She is alert and oriented to person, place, and time.  Psychiatric:        Behavior: Behavior normal.        Thought Content: Thought content normal.        Judgment: Judgment normal.      Lab Results  Component Value Date   WBC 4.5 05/26/2018   HGB 12.7 05/26/2018   HCT 36.9 05/26/2018   MCV 92.7 05/26/2018   PLT 254 05/26/2018   Lab Results  Component Value Date   FERRITIN 362 (H) 05/26/2018   IRON 116 05/26/2018   TIBC 324 05/26/2018   UIBC 208 05/26/2018   IRONPCTSAT 36 05/26/2018   Lab Results   Component Value Date   RETICCTPCT 0.8 02/12/2015   RBC 3.98 05/26/2018   No results found for: KPAFRELGTCHN, LAMBDASER, KAPLAMBRATIO No results found for: IGGSERUM, IGA, IGMSERUM No results found for: Odetta Pink, SPEI   Chemistry      Component Value Date/Time   NA 139 05/26/2018 0815   NA 145 03/27/2017 1101   NA 141 07/30/2016 0750   K 4.1 05/26/2018 0815   K 4.5 03/27/2017 1101  K 4.2 07/30/2016 0750   CL 104 05/26/2018 0815   CL 103 03/27/2017 1101   CO2 27 05/26/2018 0815   CO2 28 03/27/2017 1101   CO2 26 07/30/2016 0750   BUN 18 05/26/2018 0815   BUN 12 03/27/2017 1101   BUN 12.1 07/30/2016 0750   CREATININE 0.77 05/26/2018 0815   CREATININE 0.9 03/27/2017 1101   CREATININE 0.7 07/30/2016 0750      Component Value Date/Time   CALCIUM 9.6 05/26/2018 0815   CALCIUM 9.6 03/27/2017 1101   CALCIUM 9.5 07/30/2016 0750   ALKPHOS 83 05/26/2018 0815   ALKPHOS 101 (H) 03/27/2017 1101   ALKPHOS 72 07/30/2016 0750   AST 15 05/26/2018 0815   AST 22 07/30/2016 0750   ALT 12 05/26/2018 0815   ALT 20 03/27/2017 1101   ALT 22 07/30/2016 0750   BILITOT 0.7 05/26/2018 0815   BILITOT 0.54 07/30/2016 0750       Impression and Plan: Ms. Brach is a very pleasant 48 yo caucasian female with recurrent colon cancer - HER2 (-)/KRAS (+)/MSI stable/MMR normal/BRAF -.   We will go ahead with her fourth cycle of treatment.  After this, we will then plan for another PET scan on her.  I think this will be very helpful.  I am just happy that her quality of life is doing well right now.     Volanda Napoleon, MD 3/24/20208:42 AM

## 2018-06-22 ENCOUNTER — Encounter: Payer: Self-pay | Admitting: Family

## 2018-06-23 ENCOUNTER — Inpatient Hospital Stay: Payer: BLUE CROSS/BLUE SHIELD | Admitting: Hematology & Oncology

## 2018-06-23 ENCOUNTER — Other Ambulatory Visit: Payer: Self-pay

## 2018-06-23 ENCOUNTER — Encounter: Payer: Self-pay | Admitting: Hematology & Oncology

## 2018-06-23 VITALS — BP 124/88 | HR 65 | Resp 18 | Wt 166.0 lb

## 2018-06-23 DIAGNOSIS — D509 Iron deficiency anemia, unspecified: Secondary | ICD-10-CM | POA: Diagnosis not present

## 2018-06-23 DIAGNOSIS — C189 Malignant neoplasm of colon, unspecified: Secondary | ICD-10-CM | POA: Diagnosis not present

## 2018-06-23 DIAGNOSIS — Z5112 Encounter for antineoplastic immunotherapy: Secondary | ICD-10-CM | POA: Diagnosis not present

## 2018-06-23 NOTE — Progress Notes (Signed)
Hematology and Oncology Follow Up Visit  Carla Mills [id number] 1970/05/21 48 y.o. 06/23/2018   Principle Diagnosis:  Recurrent colon cancer - HER2 (-)/KRAS mutant/MSI stable/MMR normal/BRAF (wt) -   Past Therapy: FOLFOXIRI - s/p cycle #12 -completed on 11/12/2016 Radiation therapy with Xeloda - completed on 06/06/2017 Status post exploratory laparotomy with HIPEC - 07/29/2016 FOLFOXIRI/Avastin - s/p cycle #8 (Avastin on hold)  Current Therapy:   Xeloda/Avastin - cycle #3 on 04/08/2018   Interim History:  Carla Mills is here today for an early visit.  In fact, I am not sure why she actually is here.  She had treatment last week.  She is doing okay.  She has had no problems since last week.  I have to believe that this is an old appointment.  The insurance company apparently is now refusing the Avastin.  I cannot believe why they are refusing Avastin since it is clearly indicated for colon cancer.  Her insurance company also is not approving the PET scan.  I will have to talk to them about the PET scan.  She is taking precautions with respect to the coronavirus.  She is staying at home.  Her 3 daughters are also at home right now.  Overall, her performance status is ECOG 1.  Medications:  Allergies as of 06/23/2018      Reactions   Bactrim [sulfamethoxazole-trimethoprim] Swelling, Other (See Comments)   Severe generalized swelling   Capecitabine Hives, Other (See Comments)   Legs and arms, after completing therapy   Oxaprozin Hives   Day pro   Penicillins Hives, Other (See Comments)   Has patient had a PCN reaction causing immediate rash, facial/tongue/throat swelling, SOB or lightheadedness with hypotension: no Has patient had a PCN reaction causing severe rash involving mucus membranes or skin necrosis: {no Has patient had a PCN reaction that required hospitalization no Has patient had a PCN reaction occurring within the last 10 years: no If all of the  above answers are "NO", then may proceed with Cephalosporin use.      Medication List       Accurate as of June 23, 2018  8:54 AM. Always use your most recent med list.        capecitabine 500 MG tablet Commonly known as:  XELODA Take 5 tablets in the AM and 4 tablets in the PM, daily for 14 days   cetirizine 10 MG tablet Commonly known as:  ZYRTEC Take 10 mg by mouth daily.   fluticasone 50 MCG/ACT nasal spray Commonly known as:  FLONASE Place 1 spray into both nostrils 2 (two) times daily.   traMADol 50 MG tablet Commonly known as:  ULTRAM Take 1 tablet (50 mg total) by mouth every 6 (six) hours as needed.       Allergies:  Allergies  Allergen Reactions  . Bactrim [Sulfamethoxazole-Trimethoprim] Swelling and Other (See Comments)    Severe generalized swelling  . Capecitabine Hives and Other (See Comments)    Legs and arms, after completing therapy  . Oxaprozin Hives    Day pro  . Penicillins Hives and Other (See Comments)    Has patient had a PCN reaction causing immediate rash, facial/tongue/throat swelling, SOB or lightheadedness with hypotension: no Has patient had a PCN reaction causing severe rash involving mucus membranes or skin necrosis: {no Has patient had a PCN reaction that required hospitalization no Has patient had a PCN reaction occurring within the last 10 years: no If all of the above answers are "NO",  then may proceed with Cephalosporin use.    Past Medical History, Surgical history, Social history, and Family History were reviewed and updated.  Review of Systems: Review of Systems  Constitutional: Negative.   HENT: Negative.   Eyes: Negative.   Respiratory: Negative.   Cardiovascular: Negative.   Gastrointestinal: Negative.   Genitourinary: Negative.   Musculoskeletal: Negative.   Skin: Negative.   Neurological: Negative.   Endo/Heme/Allergies: Negative.   Psychiatric/Behavioral: Negative.       Physical Exam:  weight is 166 lb  (75.3 kg). Her blood pressure is 124/88 and her pulse is 65. Her respiration is 18 and oxygen saturation is 100%.   Wt Readings from Last 3 Encounters:  06/23/18 166 lb (75.3 kg)  06/16/18 164 lb 4 oz (74.5 kg)  05/26/18 161 lb 9.6 oz (73.3 kg)    Her vital signs show a temperature 97.6.  Pulse 76.  Blood pressure 114/75.  Physical Exam Vitals signs reviewed.  HENT:     Head: Normocephalic and atraumatic.  Eyes:     Pupils: Pupils are equal, round, and reactive to light.  Neck:     Musculoskeletal: Normal range of motion.  Cardiovascular:     Rate and Rhythm: Normal rate and regular rhythm.     Heart sounds: Normal heart sounds.  Pulmonary:     Effort: Pulmonary effort is normal.     Breath sounds: Normal breath sounds.  Abdominal:     General: Bowel sounds are normal.     Palpations: Abdomen is soft.     Comments: Abdominal exam shows laparotomy scars that are well-healed.  She has a colostomy in the left lower quadrant.  She has no fluid wave.  There is no guarding or rebound tenderness.  She has no palpable liver or spleen tip.  Musculoskeletal: Normal range of motion.        General: No tenderness or deformity.  Lymphadenopathy:     Cervical: No cervical adenopathy.  Skin:    General: Skin is warm and dry.     Findings: No erythema or rash.  Neurological:     Mental Status: She is alert and oriented to person, place, and time.  Psychiatric:        Behavior: Behavior normal.        Thought Content: Thought content normal.        Judgment: Judgment normal.      Lab Results  Component Value Date   WBC 5.7 06/16/2018   HGB 13.1 06/16/2018   HCT 38.8 06/16/2018   MCV 97.0 06/16/2018   PLT 233 06/16/2018   Lab Results  Component Value Date   FERRITIN 784 (H) 06/16/2018   IRON 139 06/16/2018   TIBC 321 06/16/2018   UIBC 182 06/16/2018   IRONPCTSAT 43 06/16/2018   Lab Results  Component Value Date   RETICCTPCT 0.8 02/12/2015   RBC 4.00 06/16/2018   No  results found for: KPAFRELGTCHN, LAMBDASER, KAPLAMBRATIO No results found for: IGGSERUM, IGA, IGMSERUM No results found for: Odetta Pink, SPEI   Chemistry      Component Value Date/Time   NA 139 06/16/2018 0845   NA 145 03/27/2017 1101   NA 141 07/30/2016 0750   K 4.0 06/16/2018 0845   K 4.5 03/27/2017 1101   K 4.2 07/30/2016 0750   CL 104 06/16/2018 0845   CL 103 03/27/2017 1101   CO2 27 06/16/2018 0845   CO2 28 03/27/2017 1101  CO2 26 07/30/2016 0750   BUN 16 06/16/2018 0845   BUN 12 03/27/2017 1101   BUN 12.1 07/30/2016 0750   CREATININE 0.81 06/16/2018 0845   CREATININE 0.9 03/27/2017 1101   CREATININE 0.7 07/30/2016 0750      Component Value Date/Time   CALCIUM 9.3 06/16/2018 0845   CALCIUM 9.6 03/27/2017 1101   CALCIUM 9.5 07/30/2016 0750   ALKPHOS 80 06/16/2018 0845   ALKPHOS 101 (H) 03/27/2017 1101   ALKPHOS 72 07/30/2016 0750   AST 19 06/16/2018 0845   AST 22 07/30/2016 0750   ALT 17 06/16/2018 0845   ALT 20 03/27/2017 1101   ALT 22 07/30/2016 0750   BILITOT 0.9 06/16/2018 0845   BILITOT 0.54 07/30/2016 0750       Impression and Plan: Carla Mills is a very pleasant 48 yo caucasian female with recurrent colon cancer - HER2 (-)/KRAS (+)/MSI stable/MMR normal/BRAF -.   Hopefully, the insurance company will approve the PET scan.  I am sure that there will once I talk to a physician.  I will see her back in a couple weeks when she actually is due for her next cycle of treatment.       Volanda Napoleon, MD 3/31/20208:54 AM

## 2018-06-29 ENCOUNTER — Other Ambulatory Visit: Payer: Self-pay | Admitting: Family

## 2018-06-29 DIAGNOSIS — M9903 Segmental and somatic dysfunction of lumbar region: Secondary | ICD-10-CM | POA: Diagnosis not present

## 2018-06-29 DIAGNOSIS — M62830 Muscle spasm of back: Secondary | ICD-10-CM | POA: Diagnosis not present

## 2018-06-29 DIAGNOSIS — M9904 Segmental and somatic dysfunction of sacral region: Secondary | ICD-10-CM | POA: Diagnosis not present

## 2018-06-29 DIAGNOSIS — M545 Low back pain: Secondary | ICD-10-CM | POA: Diagnosis not present

## 2018-06-30 ENCOUNTER — Ambulatory Visit (HOSPITAL_COMMUNITY)
Admission: RE | Admit: 2018-06-30 | Discharge: 2018-06-30 | Disposition: A | Discharge status: Home / Self Care | Payer: BLUE CROSS/BLUE SHIELD | Source: Ambulatory Visit | Attending: Hematology & Oncology | Admitting: Hematology & Oncology

## 2018-06-30 ENCOUNTER — Other Ambulatory Visit: Payer: Self-pay

## 2018-06-30 ENCOUNTER — Telehealth: Payer: Self-pay | Admitting: *Deleted

## 2018-06-30 DIAGNOSIS — C189 Malignant neoplasm of colon, unspecified: Secondary | ICD-10-CM | POA: Diagnosis not present

## 2018-06-30 LAB — GLUCOSE, CAPILLARY: Glucose-Capillary: 90 mg/dL (ref 70–99)

## 2018-06-30 MED ORDER — FLUDEOXYGLUCOSE F - 18 (FDG) INJECTION
8.3300 | Freq: Once | INTRAVENOUS | Status: AC | PRN
  Administered 2018-06-30: 8.33 via INTRAVENOUS

## 2018-06-30 NOTE — Telephone Encounter (Signed)
-----   Message from Volanda Napoleon, MD sent at 06/30/2018 12:02 PM EDT ----- Call - the PET scan looks great!!!  NO metastatic disease!!! Activity along the rectal pouch is much less!!!  [place] job!!  Happy [place]!!!  Laurey Arrow

## 2018-06-30 NOTE — Telephone Encounter (Signed)
As noted below by Dr. Marin Olp, I informed the patient of her PET scan results. She verbalized understanding.

## 2018-07-07 ENCOUNTER — Inpatient Hospital Stay: Payer: BLUE CROSS/BLUE SHIELD | Attending: Radiation Oncology

## 2018-07-07 ENCOUNTER — Inpatient Hospital Stay (HOSPITAL_BASED_OUTPATIENT_CLINIC_OR_DEPARTMENT_OTHER): Payer: BLUE CROSS/BLUE SHIELD | Admitting: Family

## 2018-07-07 ENCOUNTER — Other Ambulatory Visit: Payer: Self-pay | Admitting: Family

## 2018-07-07 ENCOUNTER — Encounter: Payer: Self-pay | Admitting: Family

## 2018-07-07 ENCOUNTER — Inpatient Hospital Stay: Payer: BLUE CROSS/BLUE SHIELD

## 2018-07-07 ENCOUNTER — Other Ambulatory Visit: Payer: Self-pay

## 2018-07-07 VITALS — BP 106/67 | HR 75 | Resp 18 | Wt 164.0 lb

## 2018-07-07 DIAGNOSIS — Z5112 Encounter for antineoplastic immunotherapy: Secondary | ICD-10-CM | POA: Diagnosis not present

## 2018-07-07 DIAGNOSIS — C189 Malignant neoplasm of colon, unspecified: Secondary | ICD-10-CM

## 2018-07-07 DIAGNOSIS — M79641 Pain in right hand: Secondary | ICD-10-CM | POA: Diagnosis not present

## 2018-07-07 DIAGNOSIS — Z79899 Other long term (current) drug therapy: Secondary | ICD-10-CM

## 2018-07-07 DIAGNOSIS — R238 Other skin changes: Secondary | ICD-10-CM | POA: Diagnosis not present

## 2018-07-07 DIAGNOSIS — R809 Proteinuria, unspecified: Secondary | ICD-10-CM

## 2018-07-07 DIAGNOSIS — Z923 Personal history of irradiation: Secondary | ICD-10-CM

## 2018-07-07 DIAGNOSIS — M79642 Pain in left hand: Secondary | ICD-10-CM | POA: Diagnosis not present

## 2018-07-07 LAB — CBC WITH DIFFERENTIAL (CANCER CENTER ONLY)
Abs Immature Granulocytes: 0.01 10*3/uL (ref 0.00–0.07)
Basophils Absolute: 0 10*3/uL (ref 0.0–0.1)
Basophils Relative: 1 %
Eosinophils Absolute: 0.2 10*3/uL (ref 0.0–0.5)
Eosinophils Relative: 3 %
HCT: 36.8 % (ref 36.0–46.0)
Hemoglobin: 12.7 g/dL (ref 12.0–15.0)
Immature Granulocytes: 0 %
Lymphocytes Relative: 18 %
Lymphs Abs: 1 10*3/uL (ref 0.7–4.0)
MCH: 34.2 pg — ABNORMAL HIGH (ref 26.0–34.0)
MCHC: 34.5 g/dL (ref 30.0–36.0)
MCV: 99.2 fL (ref 80.0–100.0)
Monocytes Absolute: 0.6 10*3/uL (ref 0.1–1.0)
Monocytes Relative: 10 %
Neutro Abs: 4 10*3/uL (ref 1.7–7.7)
Neutrophils Relative %: 68 %
Platelet Count: 239 10*3/uL (ref 150–400)
RBC: 3.71 MIL/uL — ABNORMAL LOW (ref 3.87–5.11)
RDW: 17.4 % — ABNORMAL HIGH (ref 11.5–15.5)
WBC Count: 5.8 10*3/uL (ref 4.0–10.5)
nRBC: 0 % (ref 0.0–0.2)

## 2018-07-07 LAB — CMP (CANCER CENTER ONLY)
ALT: 11 U/L (ref 0–44)
AST: 16 U/L (ref 15–41)
Albumin: 4.2 g/dL (ref 3.5–5.0)
Alkaline Phosphatase: 81 U/L (ref 38–126)
Anion gap: 8 (ref 5–15)
BUN: 19 mg/dL (ref 6–20)
CO2: 25 mmol/L (ref 22–32)
Calcium: 9.1 mg/dL (ref 8.9–10.3)
Chloride: 103 mmol/L (ref 98–111)
Creatinine: 0.89 mg/dL (ref 0.44–1.00)
GFR, Est AFR Am: >60 mL/min
GFR, Estimated: >60 mL/min
Glucose, Bld: 106 mg/dL — ABNORMAL HIGH (ref 70–99)
Potassium: 3.7 mmol/L (ref 3.5–5.1)
Sodium: 136 mmol/L (ref 135–145)
Total Bilirubin: 0.9 mg/dL (ref 0.3–1.2)
Total Protein: 6.5 g/dL (ref 6.5–8.1)

## 2018-07-07 LAB — CEA (IN HOUSE-CHCC): CEA (CHCC-In House): 3.46 ng/mL (ref 0.00–5.00)

## 2018-07-07 LAB — LACTATE DEHYDROGENASE: LDH: 159 U/L (ref 98–192)

## 2018-07-07 LAB — TOTAL PROTEIN, URINE DIPSTICK: Protein, ur: NEGATIVE mg/dL

## 2018-07-07 MED ORDER — SODIUM CHLORIDE 0.9 % IV SOLN
7.5000 mg/kg | Freq: Once | INTRAVENOUS | Status: AC
  Administered 2018-07-07: 550 mg via INTRAVENOUS
  Filled 2018-07-07: qty 16

## 2018-07-07 MED ORDER — SODIUM CHLORIDE 0.9 % IV SOLN
Freq: Once | INTRAVENOUS | Status: AC
  Administered 2018-07-07: 10:00:00 via INTRAVENOUS
  Filled 2018-07-07: qty 250

## 2018-07-07 NOTE — Patient Instructions (Signed)
Bevacizumab injection  What is this medicine?  BEVACIZUMAB (be va SIZ yoo mab) is a monoclonal antibody. It is used to treat many types of cancer.  This medicine may be used for other purposes; ask your health care provider or pharmacist if you have questions.  COMMON BRAND NAME(S): Avastin, MVASI  What should I tell my health care provider before I take this medicine?  They need to know if you have any of these conditions:  -diabetes  -heart disease  -high blood pressure  -history of coughing up blood  -prior anthracycline chemotherapy (e.g., doxorubicin, daunorubicin, epirubicin)  -recent or ongoing radiation therapy  -recent or planning to have surgery  -stroke  -an unusual or allergic reaction to bevacizumab, hamster proteins, mouse proteins, other medicines, foods, dyes, or preservatives  -pregnant or trying to get pregnant  -breast-feeding  How should I use this medicine?  This medicine is for infusion into a vein. It is given by a health care professional in a hospital or clinic setting.  Talk to your pediatrician regarding the use of this medicine in children. Special care may be needed.  Overdosage: If you think you have taken too much of this medicine contact a poison control center or emergency room at once.  NOTE: This medicine is only for you. Do not share this medicine with others.  What if I miss a dose?  It is important not to miss your dose. Call your doctor or health care professional if you are unable to keep an appointment.  What may interact with this medicine?  Interactions are not expected.  This list may not describe all possible interactions. Give your health care provider a list of all the medicines, herbs, non-prescription drugs, or dietary supplements you use. Also tell them if you smoke, drink alcohol, or use illegal drugs. Some items may interact with your medicine.  What should I watch for while using this medicine?  Your condition will be monitored carefully while you are receiving  this medicine. You will need important blood work and urine testing done while you are taking this medicine.  This medicine may increase your risk to bruise or bleed. Call your doctor or health care professional if you notice any unusual bleeding.  This medicine should be started at least 28 days following major surgery and the site of the surgery should be totally healed. Check with your doctor before scheduling dental work or surgery while you are receiving this treatment. Talk to your doctor if you have recently had surgery or if you have a wound that has not healed.  Do not become pregnant while taking this medicine or for 6 months after stopping it. Women should inform their doctor if they wish to become pregnant or think they might be pregnant. There is a potential for serious side effects to an unborn child. Talk to your health care professional or pharmacist for more information. Do not breast-feed an infant while taking this medicine and for 6 months after the last dose.  This medicine has caused ovarian failure in some women. This medicine may interfere with the ability to have a child. You should talk to your doctor or health care professional if you are concerned about your fertility.  What side effects may I notice from receiving this medicine?  Side effects that you should report to your doctor or health care professional as soon as possible:  -allergic reactions like skin rash, itching or hives, swelling of the face, lips, or tongue  -  chest pain or chest tightness  -chills  -coughing up blood  -high fever  -seizures  -severe constipation  -signs and symptoms of bleeding such as bloody or black, tarry stools; red or dark-brown urine; spitting up blood or brown material that looks like coffee grounds; red spots on the skin; unusual bruising or bleeding from the eye, gums, or nose  -signs and symptoms of a blood clot such as breathing problems; chest pain; severe, sudden headache; pain, swelling, warmth  in the leg  -signs and symptoms of a stroke like changes in vision; confusion; trouble speaking or understanding; severe headaches; sudden numbness or weakness of the face, arm or leg; trouble walking; dizziness; loss of balance or coordination  -stomach pain  -sweating  -swelling of legs or ankles  -vomiting  -weight gain  Side effects that usually do not require medical attention (report to your doctor or health care professional if they continue or are bothersome):  -back pain  -changes in taste  -decreased appetite  -dry skin  -nausea  -tiredness  This list may not describe all possible side effects. Call your doctor for medical advice about side effects. You may report side effects to FDA at 1-800-FDA-1088.  Where should I keep my medicine?  This drug is given in a hospital or clinic and will not be stored at home.  NOTE: This sheet is a summary. It may not cover all possible information. If you have questions about this medicine, talk to your doctor, pharmacist, or health care provider.   2019 Elsevier/Gold Standard (2016-03-08 14:33:29)

## 2018-07-07 NOTE — Addendum Note (Signed)
Addended by: Burney Gauze R on: 07/07/2018 10:02 AM   Modules accepted: Orders

## 2018-07-07 NOTE — Progress Notes (Signed)
Hematology and Oncology Follow Up Visit  Carla Mills [id number] February 25, 1971 48 y.o. 07/07/2018   Principle Diagnosis:  Recurrent colon cancer - HER2 (-)/KRAS mutant/MSI stable/MMR normal/BRAF (wt) -   Past Therapy: FOLFOXIRI - s/p cycle #12 -completed on 11/12/2016 Radiation therapy with Xeloda - completed on 06/06/2017 Status post exploratory laparotomy with HIPEC - 07/29/2016 FOLFOXIRI/Avastin - s/p cycle #8 (Avastin on hold)  Current Therapy:   Xeloda/Avastin - cycle 4changed to Xeloda (on 2 weeks/off 2 weeks) and Avasting every 4 weeks on 07/07/2018   Interim History:  Carla Mills is here today for follow-up and treatment. She is doing well but is noting pain in her hands "like razor blades" in the evenings when she bends her fingers. She still has peeling of the skin on her face, left side of her neck and hands. Toes feel puffy.  PET scan last week showed an interval reduction in the amount of activity along the rectal pouch. No evidence of new areas of disease.  CEA in March was 3.08.  No fever, chills, n/v, cough, dizziness, SOB, chest pain, palpitations, abdominal pain or changes in bowel or bladder habits.  No episodes of bleeding, no bruising or petechiae.  She feels that the tingling in her feet after walking or standing for an extended period of time seems a little better.  No lymphadenopathy noted on exam.  She has maintained a good appetite and is staying well hydrated. Her weight is stable.  She is walking 7 days a week and exercising at home.   ECOG Performance Status: 1 - Symptomatic but completely ambulatory  Medications:  Allergies as of 07/07/2018      Reactions   Bactrim [sulfamethoxazole-trimethoprim] Swelling, Other (See Comments)   Severe generalized swelling   Capecitabine Hives, Other (See Comments)   Legs and arms, after completing therapy   Oxaprozin Hives   Day pro   Penicillins Hives, Other (See Comments)   Has patient had a PCN  reaction causing immediate rash, facial/tongue/throat swelling, SOB or lightheadedness with hypotension: no Has patient had a PCN reaction causing severe rash involving mucus membranes or skin necrosis: {no Has patient had a PCN reaction that required hospitalization no Has patient had a PCN reaction occurring within the last 10 years: no If all of the above answers are "NO", then may proceed with Cephalosporin use.      Medication List       Accurate as of July 07, 2018  8:48 AM. Always use your most recent med list.        capecitabine 500 MG tablet Commonly known as:  XELODA Take 5 tablets in the AM and 4 tablets in the PM, daily for 14 days   cetirizine 10 MG tablet Commonly known as:  ZYRTEC Take 10 mg by mouth daily.   fluticasone 50 MCG/ACT nasal spray Commonly known as:  FLONASE Place 1 spray into both nostrils 2 (two) times daily.   traMADol 50 MG tablet Commonly known as:  ULTRAM Take 1 tablet (50 mg total) by mouth every 6 (six) hours as needed.       Allergies:  Allergies  Allergen Reactions  . Bactrim [Sulfamethoxazole-Trimethoprim] Swelling and Other (See Comments)    Severe generalized swelling  . Capecitabine Hives and Other (See Comments)    Legs and arms, after completing therapy  . Oxaprozin Hives    Day pro  . Penicillins Hives and Other (See Comments)    Has patient had a PCN reaction causing immediate rash,  facial/tongue/throat swelling, SOB or lightheadedness with hypotension: no Has patient had a PCN reaction causing severe rash involving mucus membranes or skin necrosis: {no Has patient had a PCN reaction that required hospitalization no Has patient had a PCN reaction occurring within the last 10 years: no If all of the above answers are "NO", then may proceed with Cephalosporin use.    Past Medical History, Surgical history, Social history, and Family History were reviewed and updated.  Review of Systems: All other 10 point review of  systems is negative.   Physical Exam:  weight is 164 lb (74.4 kg). Her blood pressure is 106/67 and her pulse is 75. Her respiration is 18 and oxygen saturation is 100%.   Wt Readings from Last 3 Encounters:  07/07/18 164 lb (74.4 kg)  06/23/18 166 lb (75.3 kg)  06/16/18 164 lb 4 oz (74.5 kg)    Ocular: Sclerae unicteric, pupils equal, round and reactive to light Ear-nose-throat: Oropharynx clear, dentition fair Lymphatic: No cervical or supraclavicular adenopathy Lungs no rales or rhonchi, good excursion bilaterally Heart regular rate and rhythm, no murmur appreciated Abd soft, nontender, positive bowel sounds, no liver or spleen tip palpated on exam, no fluid wave  MSK no focal spinal tenderness, no joint edema Neuro: non-focal, well-oriented, appropriate affect Breasts: Deferred   Lab Results  Component Value Date   WBC 5.8 07/07/2018   HGB 12.7 07/07/2018   HCT 36.8 07/07/2018   MCV 99.2 07/07/2018   PLT 239 07/07/2018   Lab Results  Component Value Date   FERRITIN 784 (H) 06/16/2018   IRON 139 06/16/2018   TIBC 321 06/16/2018   UIBC 182 06/16/2018   IRONPCTSAT 43 06/16/2018   Lab Results  Component Value Date   RETICCTPCT 0.8 02/12/2015   RBC 3.71 (L) 07/07/2018   No results found for: KPAFRELGTCHN, LAMBDASER, KAPLAMBRATIO No results found for: IGGSERUM, IGA, IGMSERUM No results found for: Kathrynn Ducking, MSPIKE, SPEI   Chemistry      Component Value Date/Time   NA 136 07/07/2018 0810   NA 145 03/27/2017 1101   NA 141 07/30/2016 0750   K 3.7 07/07/2018 0810   K 4.5 03/27/2017 1101   K 4.2 07/30/2016 0750   CL 103 07/07/2018 0810   CL 103 03/27/2017 1101   CO2 25 07/07/2018 0810   CO2 28 03/27/2017 1101   CO2 26 07/30/2016 0750   BUN 19 07/07/2018 0810   BUN 12 03/27/2017 1101   BUN 12.1 07/30/2016 0750   CREATININE 0.89 07/07/2018 0810   CREATININE 0.9 03/27/2017 1101   CREATININE 0.7 07/30/2016 0750       Component Value Date/Time   CALCIUM 9.1 07/07/2018 0810   CALCIUM 9.6 03/27/2017 1101   CALCIUM 9.5 07/30/2016 0750   ALKPHOS 81 07/07/2018 0810   ALKPHOS 101 (H) 03/27/2017 1101   ALKPHOS 72 07/30/2016 0750   AST 16 07/07/2018 0810   AST 22 07/30/2016 0750   ALT 11 07/07/2018 0810   ALT 20 03/27/2017 1101   ALT 22 07/30/2016 0750   BILITOT 0.9 07/07/2018 0810   BILITOT 0.54 07/30/2016 0750       Impression and Plan: Carla Mills is a very pleasant 48 yo caucasian female with recurrent colon cancer - HER2 (-)/KRAS (+)/MSI stable/MMR normal/BRAF -. She is doing well and got a good PET scan report last week.  I spoke with Dr. Marin Olp about her continued skin peeling and pain in her hands. We will  change her Xeloda to on 2 weeks off 2 weeks and Avastin every 4 weeks with follow-up.  She did receive Avastin today as planned. CEA is pending.  She is in agreement with the plan and will contact our office with any questions or concerns. We can certainly see her sooner if need be.   Laverna Peace, NP 4/14/20208:48 AM

## 2018-07-27 DIAGNOSIS — M545 Low back pain: Secondary | ICD-10-CM | POA: Diagnosis not present

## 2018-07-27 DIAGNOSIS — M9904 Segmental and somatic dysfunction of sacral region: Secondary | ICD-10-CM | POA: Diagnosis not present

## 2018-07-27 DIAGNOSIS — M9903 Segmental and somatic dysfunction of lumbar region: Secondary | ICD-10-CM | POA: Diagnosis not present

## 2018-07-27 DIAGNOSIS — M62830 Muscle spasm of back: Secondary | ICD-10-CM | POA: Diagnosis not present

## 2018-07-28 ENCOUNTER — Other Ambulatory Visit: Payer: BLUE CROSS/BLUE SHIELD

## 2018-07-28 ENCOUNTER — Ambulatory Visit: Payer: BLUE CROSS/BLUE SHIELD | Admitting: Family

## 2018-07-28 ENCOUNTER — Ambulatory Visit: Payer: BLUE CROSS/BLUE SHIELD

## 2018-08-04 ENCOUNTER — Inpatient Hospital Stay: Payer: BLUE CROSS/BLUE SHIELD

## 2018-08-04 ENCOUNTER — Inpatient Hospital Stay (HOSPITAL_BASED_OUTPATIENT_CLINIC_OR_DEPARTMENT_OTHER): Payer: BLUE CROSS/BLUE SHIELD | Admitting: Hematology & Oncology

## 2018-08-04 ENCOUNTER — Other Ambulatory Visit: Payer: Self-pay

## 2018-08-04 ENCOUNTER — Inpatient Hospital Stay: Payer: BLUE CROSS/BLUE SHIELD | Attending: Radiation Oncology

## 2018-08-04 VITALS — BP 109/75 | HR 77 | Temp 97.5°F | Resp 20 | Wt 168.2 lb

## 2018-08-04 DIAGNOSIS — Z5112 Encounter for antineoplastic immunotherapy: Secondary | ICD-10-CM | POA: Insufficient documentation

## 2018-08-04 DIAGNOSIS — Z933 Colostomy status: Secondary | ICD-10-CM | POA: Diagnosis not present

## 2018-08-04 DIAGNOSIS — Z923 Personal history of irradiation: Secondary | ICD-10-CM | POA: Diagnosis not present

## 2018-08-04 DIAGNOSIS — C189 Malignant neoplasm of colon, unspecified: Secondary | ICD-10-CM

## 2018-08-04 DIAGNOSIS — R809 Proteinuria, unspecified: Secondary | ICD-10-CM

## 2018-08-04 LAB — CBC WITH DIFFERENTIAL (CANCER CENTER ONLY)
Abs Immature Granulocytes: 0.01 10*3/uL (ref 0.00–0.07)
Basophils Absolute: 0 10*3/uL (ref 0.0–0.1)
Basophils Relative: 1 %
Eosinophils Absolute: 0.2 10*3/uL (ref 0.0–0.5)
Eosinophils Relative: 4 %
HCT: 37.8 % (ref 36.0–46.0)
Hemoglobin: 13 g/dL (ref 12.0–15.0)
Immature Granulocytes: 0 %
Lymphocytes Relative: 15 %
Lymphs Abs: 0.9 10*3/uL (ref 0.7–4.0)
MCH: 34.9 pg — ABNORMAL HIGH (ref 26.0–34.0)
MCHC: 34.4 g/dL (ref 30.0–36.0)
MCV: 101.3 fL — ABNORMAL HIGH (ref 80.0–100.0)
Monocytes Absolute: 0.5 10*3/uL (ref 0.1–1.0)
Monocytes Relative: 9 %
Neutro Abs: 4.3 10*3/uL (ref 1.7–7.7)
Neutrophils Relative %: 71 %
Platelet Count: 203 10*3/uL (ref 150–400)
RBC: 3.73 MIL/uL — ABNORMAL LOW (ref 3.87–5.11)
RDW: 14.5 % (ref 11.5–15.5)
WBC Count: 5.9 10*3/uL (ref 4.0–10.5)
nRBC: 0 % (ref 0.0–0.2)

## 2018-08-04 LAB — CMP (CANCER CENTER ONLY)
ALT: 17 U/L (ref 0–44)
AST: 19 U/L (ref 15–41)
Albumin: 4.1 g/dL (ref 3.5–5.0)
Alkaline Phosphatase: 87 U/L (ref 38–126)
Anion gap: 9 (ref 5–15)
BUN: 17 mg/dL (ref 6–20)
CO2: 26 mmol/L (ref 22–32)
Calcium: 9.6 mg/dL (ref 8.9–10.3)
Chloride: 106 mmol/L (ref 98–111)
Creatinine: 0.82 mg/dL (ref 0.44–1.00)
GFR, Est AFR Am: >60 mL/min (ref >60)
GFR, Estimated: >60 mL/min (ref >60)
Glucose, Bld: 87 mg/dL (ref 70–99)
Potassium: 4 mmol/L (ref 3.5–5.1)
Sodium: 141 mmol/L (ref 135–145)
Total Bilirubin: 0.7 mg/dL (ref 0.3–1.2)
Total Protein: 6.6 g/dL (ref 6.5–8.1)

## 2018-08-04 LAB — CEA (IN HOUSE-CHCC): CEA (CHCC-In House): 3.45 ng/mL (ref 0.00–5.00)

## 2018-08-04 LAB — TOTAL PROTEIN, URINE DIPSTICK: Protein, ur: 1 mg/dL

## 2018-08-04 LAB — LACTATE DEHYDROGENASE: LDH: 181 U/L (ref 98–192)

## 2018-08-04 MED ORDER — SODIUM CHLORIDE 0.9 % IV SOLN
Freq: Once | INTRAVENOUS | Status: AC
  Administered 2018-08-04: 10:00:00 via INTRAVENOUS
  Filled 2018-08-04: qty 250

## 2018-08-04 MED ORDER — SODIUM CHLORIDE 0.9 % IV SOLN
7.5000 mg/kg | Freq: Once | INTRAVENOUS | Status: AC
  Administered 2018-08-04: 550 mg via INTRAVENOUS
  Filled 2018-08-04: qty 14

## 2018-08-04 MED ORDER — HEPARIN SOD (PORK) LOCK FLUSH 100 UNIT/ML IV SOLN
500.0000 [IU] | Freq: Once | INTRAVENOUS | Status: AC | PRN
  Administered 2018-08-04: 500 [IU]
  Filled 2018-08-04: qty 5

## 2018-08-04 MED ORDER — SODIUM CHLORIDE 0.9% FLUSH
10.0000 mL | INTRAVENOUS | Status: DC | PRN
  Administered 2018-08-04: 10 mL
  Filled 2018-08-04: qty 10

## 2018-08-04 NOTE — Progress Notes (Signed)
Hematology and Oncology Follow Up Visit  Carla Mills [id number] 09-08-1970 48 y.o. 08/04/2018   Principle Diagnosis:  Recurrent colon cancer - HER2 (-)/KRAS mutant/MSI stable/MMR normal/BRAF (wt) -   Past Therapy: FOLFOXIRI - s/p cycle #12 -completed on 11/12/2016 Radiation therapy with Xeloda - completed on 06/06/2017 Status post exploratory laparotomy with HIPEC - 07/29/2016 FOLFOXIRI/Avastin - s/p cycle #8 (Avastin on hold)  Current Therapy:   Xeloda/Avastin - cycle #3 on 04/08/2018  ( Xeloda is 2500 po BID x 10 day)   Interim History:  Carla Mills is here today for for follow-up.  She is doing okay.  She is having some PPE with the Xeloda.  Given her extra week off.  She is doing the Xeloda for 13 days.  I think we should try to increase this a little dose to 2500 mg p.o. twice daily for 10 days.  Maybe this will help her hands and feet a little bit.  Otherwise she is doing well.  Her colostomy is working well.  She has stent in her ureter.  This is not causing any problems.  She was back to see the urologist in a week or so.  She does not have pain.  There is no bleeding.  She has had no leg swelling.  She has had no cough or shortness of breath.  She and her family were actually able to go to [place] to a soccer camp that is been a start for her daughter.  They really enjoyed this.  The restaurants there opened up a little bit so they are able to eat out.  Overall, her performance status is ECOG 1.  Medications:  Allergies as of 08/04/2018      Reactions   Bactrim [sulfamethoxazole-trimethoprim] Swelling, Other (See Comments)   Severe generalized swelling   Capecitabine Hives, Other (See Comments)   Legs and arms, after completing therapy   Oxaprozin Hives   Day pro   Penicillins Hives, Other (See Comments)   Has patient had a PCN reaction causing immediate rash, facial/tongue/throat swelling, SOB or lightheadedness with hypotension: no Has patient had  a PCN reaction causing severe rash involving mucus membranes or skin necrosis: {no Has patient had a PCN reaction that required hospitalization no Has patient had a PCN reaction occurring within the last 10 years: no If all of the above answers are "NO", then may proceed with Cephalosporin use.      Medication List       Accurate as of Aug 04, 2018  8:47 AM. If you have any questions, ask your nurse or doctor.        capecitabine 500 MG tablet Commonly known as:  XELODA Take 5 tablets in the AM and 4 tablets in the PM, daily for 14 days   cetirizine 10 MG tablet Commonly known as:  ZYRTEC Take 10 mg by mouth daily.   fluticasone 50 MCG/ACT nasal spray Commonly known as:  FLONASE Place 1 spray into both nostrils 2 (two) times daily.   traMADol 50 MG tablet Commonly known as:  ULTRAM Take 1 tablet (50 mg total) by mouth every 6 (six) hours as needed.       Allergies:  Allergies  Allergen Reactions   Bactrim [Sulfamethoxazole-Trimethoprim] Swelling and Other (See Comments)    Severe generalized swelling   Capecitabine Hives and Other (See Comments)    Legs and arms, after completing therapy   Oxaprozin Hives    Day pro   Penicillins Hives and Other (See Comments)  Has patient had a PCN reaction causing immediate rash, facial/tongue/throat swelling, SOB or lightheadedness with hypotension: no Has patient had a PCN reaction causing severe rash involving mucus membranes or skin necrosis: {no Has patient had a PCN reaction that required hospitalization no Has patient had a PCN reaction occurring within the last 10 years: no If all of the above answers are "NO", then may proceed with Cephalosporin use.    Past Medical History, Surgical history, Social history, and Family History were reviewed and updated.  Review of Systems: Review of Systems  Constitutional: Negative.   HENT: Negative.   Eyes: Negative.   Respiratory: Negative.   Cardiovascular: Negative.     Gastrointestinal: Negative.   Genitourinary: Negative.   Musculoskeletal: Negative.   Skin: Negative.   Neurological: Negative.   Endo/Heme/Allergies: Negative.   Psychiatric/Behavioral: Negative.       Physical Exam:  weight is 168 lb 4 oz (76.3 kg). Her oral temperature is 97.5 F (36.4 C) (abnormal). Her blood pressure is 109/75 and her pulse is 77. Her respiration is 20 and oxygen saturation is 98%.   Wt Readings from Last 3 Encounters:  08/04/18 168 lb 4 oz (76.3 kg)  07/07/18 164 lb (74.4 kg)  06/23/18 166 lb (75.3 kg)    Her vital signs show a temperature 97.6.  Pulse 76.  Blood pressure 114/75.  Physical Exam Vitals signs reviewed.  HENT:     Head: Normocephalic and atraumatic.  Eyes:     Pupils: Pupils are equal, round, and reactive to light.  Neck:     Musculoskeletal: Normal range of motion.  Cardiovascular:     Rate and Rhythm: Normal rate and regular rhythm.     Heart sounds: Normal heart sounds.  Pulmonary:     Effort: Pulmonary effort is normal.     Breath sounds: Normal breath sounds.  Abdominal:     General: Bowel sounds are normal.     Palpations: Abdomen is soft.     Comments: Abdominal exam shows laparotomy scars that are well-healed.  She has a colostomy in the left lower quadrant.  She has no fluid wave.  There is no guarding or rebound tenderness.  She has no palpable liver or spleen tip.  Musculoskeletal: Normal range of motion.        General: No tenderness or deformity.  Lymphadenopathy:     Cervical: No cervical adenopathy.  Skin:    General: Skin is warm and dry.     Findings: No erythema or rash.  Neurological:     Mental Status: She is alert and oriented to person, place, and time.  Psychiatric:        Behavior: Behavior normal.        Thought Content: Thought content normal.        Judgment: Judgment normal.      Lab Results  Component Value Date   WBC 5.9 08/04/2018   HGB 13.0 08/04/2018   HCT 37.8 08/04/2018   MCV 101.3  (H) 08/04/2018   PLT 203 08/04/2018   Lab Results  Component Value Date   FERRITIN 784 (H) 06/16/2018   IRON 139 06/16/2018   TIBC 321 06/16/2018   UIBC 182 06/16/2018   IRONPCTSAT 43 06/16/2018   Lab Results  Component Value Date   RETICCTPCT 0.8 02/12/2015   RBC 3.73 (L) 08/04/2018   No results found for: KPAFRELGTCHN, LAMBDASER, KAPLAMBRATIO No results found for: IGGSERUM, IGA, IGMSERUM No results found for: TOTALPROTELP, ALBUMINELP, A1GS, A2GS, BETS, BETA2SER,  Danise Edge, SPEI   Chemistry      Component Value Date/Time   NA 136 07/07/2018 0810   NA 145 03/27/2017 1101   NA 141 07/30/2016 0750   K 3.7 07/07/2018 0810   K 4.5 03/27/2017 1101   K 4.2 07/30/2016 0750   CL 103 07/07/2018 0810   CL 103 03/27/2017 1101   CO2 25 07/07/2018 0810   CO2 28 03/27/2017 1101   CO2 26 07/30/2016 0750   BUN 19 07/07/2018 0810   BUN 12 03/27/2017 1101   BUN 12.1 07/30/2016 0750   CREATININE 0.89 07/07/2018 0810   CREATININE 0.9 03/27/2017 1101   CREATININE 0.7 07/30/2016 0750      Component Value Date/Time   CALCIUM 9.1 07/07/2018 0810   CALCIUM 9.6 03/27/2017 1101   CALCIUM 9.5 07/30/2016 0750   ALKPHOS 81 07/07/2018 0810   ALKPHOS 101 (H) 03/27/2017 1101   ALKPHOS 72 07/30/2016 0750   AST 16 07/07/2018 0810   AST 22 07/30/2016 0750   ALT 11 07/07/2018 0810   ALT 20 03/27/2017 1101   ALT 22 07/30/2016 0750   BILITOT 0.9 07/07/2018 0810   BILITOT 0.54 07/30/2016 0750       Impression and Plan: Carla Mills is a very pleasant 48 yo caucasian female with recurrent colon cancer - HER2 (-)/KRAS (+)/MSI stable/MMR normal/BRAF -.   Again, we will make an adjustment with her chemotherapy with Xeloda.  Hopefully the 10-day treatment will help with her hands and feet and still be effective.  Her last PET scan was back in early April.  We probably do not need another one until July.  We will get her back here in 1 more month.     Volanda Napoleon, MD 5/12/20208:47 AM

## 2018-08-04 NOTE — Patient Instructions (Signed)
Implanted Port Insertion, Care After  This sheet gives you information about how to care for yourself after your procedure. Your health care provider may also give you more specific instructions. If you have problems or questions, contact your health care provider.  What can I expect after the procedure?  After the procedure, it is common to have:  · Discomfort at the port insertion site.  · Bruising on the skin over the port. This should improve over 3-4 days.  Follow these instructions at home:  Port care  · After your port is placed, you will get a manufacturer's information card. The card has information about your port. Keep this card with you at all times.  · Take care of the port as told by your health care provider. Ask your health care provider if you or a family member can get training for taking care of the port at home. A home health care nurse may also take care of the port.  · Make sure to remember what type of port you have.  Incision care         · Follow instructions from your health care provider about how to take care of your port insertion site. Make sure you:  ? Wash your hands with soap and water before and after you change your bandage (dressing). If soap and water are not available, use hand sanitizer.  ? Change your dressing as told by your health care provider.  ? Leave stitches (sutures), skin glue, or adhesive strips in place. These skin closures may need to stay in place for 2 weeks or longer. If adhesive strip edges start to loosen and curl up, you may trim the loose edges. Do not remove adhesive strips completely unless your health care provider tells you to do that.  · Check your port insertion site every day for signs of infection. Check for:  ? Redness, swelling, or pain.  ? Fluid or blood.  ? Warmth.  ? Pus or a bad smell.  Activity  · Return to your normal activities as told by your health care provider. Ask your health care provider what activities are safe for you.  · Do not  lift anything that is heavier than 10 lb (4.5 kg), or the limit that you are told, until your health care provider says that it is safe.  General instructions  · Take over-the-counter and prescription medicines only as told by your health care provider.  · Do not take baths, swim, or use a hot tub until your health care provider approves. Ask your health care provider if you may take showers. You may only be allowed to take sponge baths.  · Do not drive for 24 hours if you were given a sedative during your procedure.  · Wear a medical alert bracelet in case of an emergency. This will tell any health care providers that you have a port.  · Keep all follow-up visits as told by your health care provider. This is important.  Contact a health care provider if:  · You cannot flush your port with saline as directed, or you cannot draw blood from the port.  · You have a fever or chills.  · You have redness, swelling, or pain around your port insertion site.  · You have fluid or blood coming from your port insertion site.  · Your port insertion site feels warm to the touch.  · You have pus or a bad smell coming from the port   insertion site.  Get help right away if:  · You have chest pain or shortness of breath.  · You have bleeding from your port that you cannot control.  Summary  · Take care of the port as told by your health care provider. Keep the manufacturer's information card with you at all times.  · Change your dressing as told by your health care provider.  · Contact a health care provider if you have a fever or chills or if you have redness, swelling, or pain around your port insertion site.  · Keep all follow-up visits as told by your health care provider.  This information is not intended to replace advice given to you by your health care provider. Make sure you discuss any questions you have with your health care provider.  Document Released: 12/30/2012 Document Revised: 10/07/2017 Document Reviewed:  10/07/2017  Elsevier Interactive Patient Education © 2019 [employer]

## 2018-08-04 NOTE — Patient Instructions (Signed)
Bevacizumab injection  What is this medicine?  BEVACIZUMAB (be va SIZ yoo mab) is a monoclonal antibody. It is used to treat many types of cancer.  This medicine may be used for other purposes; ask your health care provider or pharmacist if you have questions.  COMMON BRAND NAME(S): Avastin, MVASI  What should I tell my health care provider before I take this medicine?  They need to know if you have any of these conditions:  -diabetes  -heart disease  -high blood pressure  -history of coughing up blood  -prior anthracycline chemotherapy (e.g., doxorubicin, daunorubicin, epirubicin)  -recent or ongoing radiation therapy  -recent or planning to have surgery  -stroke  -an unusual or allergic reaction to bevacizumab, hamster proteins, mouse proteins, other medicines, foods, dyes, or preservatives  -pregnant or trying to get pregnant  -breast-feeding  How should I use this medicine?  This medicine is for infusion into a vein. It is given by a health care professional in a hospital or clinic setting.  Talk to your pediatrician regarding the use of this medicine in children. Special care may be needed.  Overdosage: If you think you have taken too much of this medicine contact a poison control center or emergency room at once.  NOTE: This medicine is only for you. Do not share this medicine with others.  What if I miss a dose?  It is important not to miss your dose. Call your doctor or health care professional if you are unable to keep an appointment.  What may interact with this medicine?  Interactions are not expected.  This list may not describe all possible interactions. Give your health care provider a list of all the medicines, herbs, non-prescription drugs, or dietary supplements you use. Also tell them if you smoke, drink alcohol, or use illegal drugs. Some items may interact with your medicine.  What should I watch for while using this medicine?  Your condition will be monitored carefully while you are receiving  this medicine. You will need important blood work and urine testing done while you are taking this medicine.  This medicine may increase your risk to bruise or bleed. Call your doctor or health care professional if you notice any unusual bleeding.  This medicine should be started at least 28 days following major surgery and the site of the surgery should be totally healed. Check with your doctor before scheduling dental work or surgery while you are receiving this treatment. Talk to your doctor if you have recently had surgery or if you have a wound that has not healed.  Do not become pregnant while taking this medicine or for 6 months after stopping it. Women should inform their doctor if they wish to become pregnant or think they might be pregnant. There is a potential for serious side effects to an unborn child. Talk to your health care professional or pharmacist for more information. Do not breast-feed an infant while taking this medicine and for 6 months after the last dose.  This medicine has caused ovarian failure in some women. This medicine may interfere with the ability to have a child. You should talk to your doctor or health care professional if you are concerned about your fertility.  What side effects may I notice from receiving this medicine?  Side effects that you should report to your doctor or health care professional as soon as possible:  -allergic reactions like skin rash, itching or hives, swelling of the face, lips, or tongue  -  chest pain or chest tightness  -chills  -coughing up blood  -high fever  -seizures  -severe constipation  -signs and symptoms of bleeding such as bloody or black, tarry stools; red or dark-brown urine; spitting up blood or brown material that looks like coffee grounds; red spots on the skin; unusual bruising or bleeding from the eye, gums, or nose  -signs and symptoms of a blood clot such as breathing problems; chest pain; severe, sudden headache; pain, swelling, warmth  in the leg  -signs and symptoms of a stroke like changes in vision; confusion; trouble speaking or understanding; severe headaches; sudden numbness or weakness of the face, arm or leg; trouble walking; dizziness; loss of balance or coordination  -stomach pain  -sweating  -swelling of legs or ankles  -vomiting  -weight gain  Side effects that usually do not require medical attention (report to your doctor or health care professional if they continue or are bothersome):  -back pain  -changes in taste  -decreased appetite  -dry skin  -nausea  -tiredness  This list may not describe all possible side effects. Call your doctor for medical advice about side effects. You may report side effects to FDA at 1-800-FDA-1088.  Where should I keep my medicine?  This drug is given in a hospital or clinic and will not be stored at home.  NOTE: This sheet is a summary. It may not cover all possible information. If you have questions about this medicine, talk to your doctor, pharmacist, or health care provider.   2019 Elsevier/Gold Standard (2016-03-08 14:33:29)

## 2018-08-04 NOTE — Progress Notes (Signed)
Ok to treat per Dr. Marin Olp.

## 2018-08-06 DIAGNOSIS — Z85038 Personal history of other malignant neoplasm of large intestine: Secondary | ICD-10-CM | POA: Diagnosis not present

## 2018-08-06 DIAGNOSIS — Z933 Colostomy status: Secondary | ICD-10-CM | POA: Diagnosis not present

## 2018-08-10 DIAGNOSIS — N131 Hydronephrosis with ureteral stricture, not elsewhere classified: Secondary | ICD-10-CM | POA: Diagnosis not present

## 2018-08-14 ENCOUNTER — Other Ambulatory Visit: Payer: Self-pay | Admitting: Urology

## 2018-08-20 ENCOUNTER — Other Ambulatory Visit: Payer: Self-pay

## 2018-08-20 ENCOUNTER — Encounter (HOSPITAL_BASED_OUTPATIENT_CLINIC_OR_DEPARTMENT_OTHER): Payer: Self-pay | Admitting: *Deleted

## 2018-08-20 NOTE — Progress Notes (Signed)
Spoke with Carla Mills after midnight, arrive 700 am 08-24-2018 [facility] to take sip of water: allegra d, flonase Has surgery orders in epic No labs needed Has covid test scheduled 08-21-18 11.20 am Driver michael spouse cell [phone]

## 2018-08-21 ENCOUNTER — Other Ambulatory Visit (HOSPITAL_COMMUNITY)
Admission: RE | Admit: 2018-08-21 | Discharge: 2018-08-21 | Disposition: A | Discharge status: Home / Self Care | Payer: BLUE CROSS/BLUE SHIELD | Source: Ambulatory Visit | Attending: Urology | Admitting: Urology

## 2018-08-21 DIAGNOSIS — M62830 Muscle spasm of back: Secondary | ICD-10-CM | POA: Diagnosis not present

## 2018-08-21 DIAGNOSIS — Z1159 Encounter for screening for other viral diseases: Secondary | ICD-10-CM | POA: Insufficient documentation

## 2018-08-21 DIAGNOSIS — M9904 Segmental and somatic dysfunction of sacral region: Secondary | ICD-10-CM | POA: Diagnosis not present

## 2018-08-21 DIAGNOSIS — M9903 Segmental and somatic dysfunction of lumbar region: Secondary | ICD-10-CM | POA: Diagnosis not present

## 2018-08-21 DIAGNOSIS — M545 Low back pain: Secondary | ICD-10-CM | POA: Diagnosis not present

## 2018-08-21 LAB — SARS CORONAVIRUS 2 BY RT PCR (HOSPITAL ORDER, PERFORMED IN ~~LOC~~ HOSPITAL LAB): SARS Coronavirus 2: NEGATIVE

## 2018-08-21 NOTE — Progress Notes (Signed)
SPOKE W/  _[name]     SCREENING SYMPTOMS OF COVID 19:   COUGH--no  RUNNY NOSE--- no  SORE THROAT---no  NASAL CONGESTION----no  SNEEZING----no  SHORTNESS OF BREATH---no  DIFFICULTY BREATHING--- no TEMP >100.0 -----no  UNEXPLAINED BODY ACHES------no  CHILLS -------- no  HEADACHES ---------no  LOSS OF SMELL/ TASTE --------no    HAVE YOU OR ANY FAMILY MEMBER TRAVELLED PAST 14 DAYS OUT OF THE   COUNTY---n STATE----n COUNTRY----n  HAVE YOU OR ANY FAMILY MEMBER BEEN EXPOSED TO ANYONE WITH COVID 19? n  Reminded to self quarantine

## 2018-08-24 ENCOUNTER — Encounter (HOSPITAL_BASED_OUTPATIENT_CLINIC_OR_DEPARTMENT_OTHER)
Admission: RE | Disposition: A | Discharge status: Home / Self Care | Payer: Self-pay | Source: Home / Self Care | Attending: Urology

## 2018-08-24 ENCOUNTER — Other Ambulatory Visit: Payer: Self-pay

## 2018-08-24 ENCOUNTER — Encounter (HOSPITAL_BASED_OUTPATIENT_CLINIC_OR_DEPARTMENT_OTHER): Payer: Self-pay | Admitting: Emergency Medicine

## 2018-08-24 ENCOUNTER — Ambulatory Visit (HOSPITAL_BASED_OUTPATIENT_CLINIC_OR_DEPARTMENT_OTHER): Payer: BLUE CROSS/BLUE SHIELD | Admitting: Anesthesiology

## 2018-08-24 ENCOUNTER — Ambulatory Visit (HOSPITAL_BASED_OUTPATIENT_CLINIC_OR_DEPARTMENT_OTHER)
Admission: RE | Admit: 2018-08-24 | Discharge: 2018-08-24 | Disposition: A | Discharge status: Home / Self Care | Payer: BLUE CROSS/BLUE SHIELD | Attending: Urology | Admitting: Urology

## 2018-08-24 DIAGNOSIS — N131 Hydronephrosis with ureteral stricture, not elsewhere classified: Secondary | ICD-10-CM | POA: Insufficient documentation

## 2018-08-24 DIAGNOSIS — C187 Malignant neoplasm of sigmoid colon: Secondary | ICD-10-CM | POA: Insufficient documentation

## 2018-08-24 DIAGNOSIS — C772 Secondary and unspecified malignant neoplasm of intra-abdominal lymph nodes: Secondary | ICD-10-CM | POA: Insufficient documentation

## 2018-08-24 DIAGNOSIS — Z79899 Other long term (current) drug therapy: Secondary | ICD-10-CM | POA: Diagnosis not present

## 2018-08-24 DIAGNOSIS — Z923 Personal history of irradiation: Secondary | ICD-10-CM | POA: Insufficient documentation

## 2018-08-24 DIAGNOSIS — F419 Anxiety disorder, unspecified: Secondary | ICD-10-CM | POA: Diagnosis not present

## 2018-08-24 DIAGNOSIS — D509 Iron deficiency anemia, unspecified: Secondary | ICD-10-CM | POA: Diagnosis not present

## 2018-08-24 HISTORY — PX: CYSTOSCOPY W/ URETERAL STENT PLACEMENT: SHX1429

## 2018-08-24 HISTORY — PX: CYSTOSCOPY W/ URETERAL STENT REMOVAL: SHX1430

## 2018-08-24 SURGERY — CYSTOSCOPY, WITH RETROGRADE PYELOGRAM AND URETERAL STENT INSERTION
Anesthesia: General | Site: Ureter | Laterality: Right

## 2018-08-24 MED ORDER — LIDOCAINE 2% (20 MG/ML) 5 ML SYRINGE
INTRAMUSCULAR | Status: AC
  Filled 2018-08-24: qty 5

## 2018-08-24 MED ORDER — MIDAZOLAM HCL 2 MG/2ML IJ SOLN
INTRAMUSCULAR | Status: DC | PRN
  Administered 2018-08-24: 2 mg via INTRAVENOUS

## 2018-08-24 MED ORDER — LIDOCAINE 2% (20 MG/ML) 5 ML SYRINGE
INTRAMUSCULAR | Status: DC | PRN
  Administered 2018-08-24: 60 mg via INTRAVENOUS

## 2018-08-24 MED ORDER — PROPOFOL 10 MG/ML IV BOLUS
INTRAVENOUS | Status: DC | PRN
  Administered 2018-08-24: 150 mg via INTRAVENOUS

## 2018-08-24 MED ORDER — DEXAMETHASONE SODIUM PHOSPHATE 10 MG/ML IJ SOLN
INTRAMUSCULAR | Status: DC | PRN
  Administered 2018-08-24: 5 mg via INTRAVENOUS

## 2018-08-24 MED ORDER — CIPROFLOXACIN IN D5W 400 MG/200ML IV SOLN
INTRAVENOUS | Status: AC
  Filled 2018-08-24: qty 200

## 2018-08-24 MED ORDER — PROMETHAZINE HCL 25 MG/ML IJ SOLN
6.2500 mg | INTRAMUSCULAR | Status: DC | PRN
  Filled 2018-08-24: qty 1

## 2018-08-24 MED ORDER — IOHEXOL 300 MG/ML  SOLN
INTRAMUSCULAR | Status: DC | PRN
  Administered 2018-08-24: 20 mL

## 2018-08-24 MED ORDER — DEXAMETHASONE SODIUM PHOSPHATE 10 MG/ML IJ SOLN
INTRAMUSCULAR | Status: AC
  Filled 2018-08-24: qty 1

## 2018-08-24 MED ORDER — KETOROLAC TROMETHAMINE 30 MG/ML IJ SOLN
INTRAMUSCULAR | Status: AC
  Filled 2018-08-24: qty 1

## 2018-08-24 MED ORDER — FENTANYL CITRATE (PF) 100 MCG/2ML IJ SOLN
INTRAMUSCULAR | Status: AC
  Filled 2018-08-24: qty 2

## 2018-08-24 MED ORDER — PROPOFOL 10 MG/ML IV BOLUS
INTRAVENOUS | Status: AC
  Filled 2018-08-24: qty 40

## 2018-08-24 MED ORDER — SODIUM CHLORIDE 0.9 % IR SOLN
Status: DC | PRN
  Administered 2018-08-24: 3000 mL via INTRAVESICAL

## 2018-08-24 MED ORDER — LACTATED RINGERS IV SOLN
INTRAVENOUS | Status: DC
  Administered 2018-08-24: 08:00:00 via INTRAVENOUS
  Filled 2018-08-24: qty 1000

## 2018-08-24 MED ORDER — CIPROFLOXACIN IN D5W 400 MG/200ML IV SOLN
400.0000 mg | INTRAVENOUS | Status: AC
  Administered 2018-08-24: 400 mg via INTRAVENOUS
  Filled 2018-08-24: qty 200

## 2018-08-24 MED ORDER — ONDANSETRON HCL 4 MG/2ML IJ SOLN
INTRAMUSCULAR | Status: DC | PRN
  Administered 2018-08-24: 4 mg via INTRAVENOUS

## 2018-08-24 MED ORDER — NITROFURANTOIN MACROCRYSTAL 100 MG PO CAPS: 100.0000 mg | ORAL_CAPSULE | Freq: Two times a day (BID) | ORAL | 0 refills | Status: AC

## 2018-08-24 MED ORDER — ONDANSETRON HCL 4 MG/2ML IJ SOLN
INTRAMUSCULAR | Status: AC
  Filled 2018-08-24: qty 2

## 2018-08-24 MED ORDER — MIDAZOLAM HCL 2 MG/2ML IJ SOLN
INTRAMUSCULAR | Status: AC
  Filled 2018-08-24: qty 2

## 2018-08-24 MED ORDER — FENTANYL CITRATE (PF) 100 MCG/2ML IJ SOLN
25.0000 ug | INTRAMUSCULAR | Status: DC | PRN
  Filled 2018-08-24: qty 1

## 2018-08-24 MED ORDER — ARTIFICIAL TEARS OPHTHALMIC OINT
TOPICAL_OINTMENT | OPHTHALMIC | Status: AC
  Filled 2018-08-24: qty 3.5

## 2018-08-24 MED ORDER — KETOROLAC TROMETHAMINE 30 MG/ML IJ SOLN
INTRAMUSCULAR | Status: DC | PRN
  Administered 2018-08-24: 30 mg via INTRAVENOUS

## 2018-08-24 MED FILL — NITROFURANTOIN MONO-MCR 100: 100 | 5 days supply | Qty: 10 | Fill #0

## 2018-08-24 SURGICAL SUPPLY — 22 items
BAG DRAIN URO-CYSTO SKYTR STRL (DRAIN) ×3 IMPLANT
BASKET ZERO TIP NITINOL 2.4FR (BASKET) IMPLANT
CATH INTERMIT  6FR 70CM (CATHETERS) ×3 IMPLANT
CLOTH BEACON ORANGE TIMEOUT ST (SAFETY) ×3 IMPLANT
ELECT REM PT RETURN 9FT ADLT (ELECTROSURGICAL)
ELECTRODE REM PT RTRN 9FT ADLT (ELECTROSURGICAL) IMPLANT
FIBER LASER FLEXIVA 365 (UROLOGICAL SUPPLIES) IMPLANT
FIBER LASER TRAC TIP (UROLOGICAL SUPPLIES) IMPLANT
GLOVE BIO SURGEON STRL SZ8 (GLOVE) ×3 IMPLANT
GOWN STRL REUS W/ TWL XL LVL3 (GOWN DISPOSABLE) ×2 IMPLANT
GOWN STRL REUS W/TWL XL LVL3 (GOWN DISPOSABLE) ×1
GUIDEWIRE ANG ZIPWIRE 038X150 (WIRE) IMPLANT
GUIDEWIRE STR DUAL SENSOR (WIRE) ×3 IMPLANT
IV NS IRRIG 3000ML ARTHROMATIC (IV SOLUTION) ×3 IMPLANT
KIT TURNOVER CYSTO (KITS) ×3 IMPLANT
MANIFOLD NEPTUNE II (INSTRUMENTS) ×3 IMPLANT
NS IRRIG 500ML POUR BTL (IV SOLUTION) IMPLANT
PACK CYSTO (CUSTOM PROCEDURE TRAY) ×3 IMPLANT
SHEATH ACCESS URETERAL 38CM (SHEATH) IMPLANT
STENT URET 6FRX24 CONTOUR (STENTS) ×3 IMPLANT
TUBE CONNECTING 12X1/4 (SUCTIONS) ×3 IMPLANT
TUBING UROLOGY SET (TUBING) ×3 IMPLANT

## 2018-08-24 NOTE — Anesthesia Preprocedure Evaluation (Addendum)
Anesthesia Evaluation  Patient identified by MRN, date of birth, ID band Patient awake    Reviewed: Allergy & Precautions, NPO status , Patient's Chart, lab work & pertinent test results  History of Anesthesia Complications (+) PONV  Airway Mallampati: II  TM Distance: >3 FB Neck ROM: Full    Dental  (+) Dental Advisory Given   Pulmonary neg pulmonary ROS,    breath sounds clear to auscultation       Cardiovascular negative cardio ROS   Rhythm:Regular Rate:Normal     Neuro/Psych  Neuromuscular disease    GI/Hepatic Neg liver ROS, Colon CA   Endo/Other  negative endocrine ROS  Renal/GU Renal disease   Right Hydronephrosis    Musculoskeletal   Abdominal   Peds  Hematology  (+) anemia ,   Anesthesia Other Findings   Reproductive/Obstetrics                            Lab Results  Component Value Date   WBC 5.9 08/04/2018   HGB 13.0 08/04/2018   HCT 37.8 08/04/2018   MCV 101.3 (H) 08/04/2018   PLT 203 08/04/2018   Lab Results  Component Value Date   CREATININE 0.82 08/04/2018   BUN 17 08/04/2018   NA 141 08/04/2018   K 4.0 08/04/2018   CL 106 08/04/2018   CO2 26 08/04/2018    Anesthesia Physical Anesthesia Plan  ASA: III  Anesthesia Plan: General   Post-op Pain Management:    Induction: Intravenous  PONV Risk Score and Plan: 4 or greater and Midazolam, Dexamethasone, Ondansetron and Treatment may vary due to age or medical condition  Airway Management Planned: LMA  Additional Equipment:   Intra-op Plan:   Post-operative Plan:   Informed Consent: I have reviewed the patients History and Physical, chart, labs and discussed the procedure including the risks, benefits and alternatives for the proposed anesthesia with the patient or authorized representative who has indicated his/her understanding and acceptance.     Dental advisory given  Plan Discussed with:  CRNA  Anesthesia Plan Comments:         Anesthesia Quick Evaluation

## 2018-08-24 NOTE — Anesthesia Procedure Notes (Signed)
Procedure Name: LMA Insertion Date/Time: 08/24/2018 9:06 AM Performed by: Wanita Chamberlain, CRNA Pre-anesthesia Checklist: Patient identified, Emergency Drugs available, Suction available, Patient being monitored and Timeout performed Patient Re-evaluated:Patient Re-evaluated prior to induction Oxygen Delivery Method: Circle system utilized Preoxygenation: Pre-oxygenation with 100% oxygen Induction Type: IV induction Ventilation: Mask ventilation without difficulty LMA: LMA inserted LMA Size: 4.0 Number of attempts: 1

## 2018-08-24 NOTE — Transfer of Care (Signed)
Immediate Anesthesia Transfer of Care Note  Patient: Carla Mills  Procedure(s) Performed: CYSTOSCOPY WITH RETROGRADE PYELOGRAM/URETERAL STENT EXCHANGE (Right Ureter) CYSTOSCOPY WITH STENT EXCHANGE (N/A Bladder)  Patient Location: PACU  Anesthesia Type:General  Level of Consciousness: awake, alert , oriented and patient cooperative  Airway & Oxygen Therapy: Patient Spontanous Breathing and Patient connected to nasal cannula oxygen  Post-op Assessment: Report given to RN and Post -op Vital signs reviewed and stable  Post vital signs: Reviewed and stable  Last Vitals:  Vitals Value Taken Time  BP 114/77 08/24/2018  9:30 AM  Temp 36.5 C 08/24/2018  9:28 AM  Pulse 64 08/24/2018  9:32 AM  Resp 20 08/24/2018  9:32 AM  SpO2 100 % 08/24/2018  9:32 AM  Vitals shown include unvalidated device data.  Last Pain:  Vitals:   08/24/18 0742  TempSrc: Oral      Patients Stated Pain Goal: 4 (16/10/96 0454)  Complications: No apparent anesthesia complications

## 2018-08-24 NOTE — H&P (Signed)
H&P  Chief Complaint: Right hydronephrosis  History of Present Illness: Carla Mills is a 48 y.o. year old female with history of colon cancer.  She underwent cystoscopy and right double-J stent placement for hydronephrosis in late December, 2019.  She is done well over the past few months.  It is time for stent change, and at this time we will perform stent extraction, retrograde and, if still significant obstruction, replacement of stent.  Past Medical History:  Diagnosis Date  . 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 Colonic cancer ([place]) 03/09/2015   last chemo sept 2019  . History of chemotherapy    completed on , radiation x 25 tx feb and march 2019  . History of difficult venous access   . PONV (postoperative nausea and vomiting)   . Rectal mass 02/20/15   Rectal/sigmoid mass    Past Surgical History:  Procedure Laterality Date  . ABDOMINAL HYSTERECTOMY  12/03/2017   at [place] uterus removed and tumor removed tumor lower part of colon and colostomy revision  . APPENDECTOMY  02/20/15   Abnormal appearing  . CESAREAN SECTION     x 2  . COLON RESECTION N/A 03/07/2017   Procedure: EXPLORATORY LAPAROTOMY AND CREATION OF A COLOSTOMY AND MUCOUS FISTULA;  Surgeon: Johnathan Hausen, MD;  Location: [facility];  Service: General;  Laterality: N/A;  . COLONOSCOPY N/A 10/09/2015   Procedure: COLONOSCOPY;  Surgeon: Leighton Ruff, MD;  Location: [facility];  Service: Endoscopy;  Laterality: N/A;  . COLOSTOMY  02/20/15   Sigmoid/rectal mass  . COLOSTOMY N/A 02/20/2015   Procedure: COLOSTOMY;  Surgeon: [name], MD;  Location: [facility];  Service: General;  Laterality: N/A;  . COLOSTOMY REVERSAL N/A 11/15/2015   Procedure: COLOSTOMY REVERSAL;  Surgeon: [name], MD;  Location: [facility];  Service: General;  Laterality: N/A;  . CYSTOSCOPY W/ URETERAL STENT PLACEMENT Right 03/16/2018   Procedure: CYSTOSCOPY WITH  RETROGRADE PYELOGRAM/URETERAL STENT PLACEMENT;  Surgeon: Franchot Gallo, MD;  Location[facility];  Service: Urology;  Laterality: Right;  30 MINS  . EXPLORATORY LAPAROTOMY  02/20/15   Sigmoid/rectal mass  . FLEXIBLE SIGMOIDOSCOPY N/A 02/15/2015   Procedure: FLEXIBLE SIGMOIDOSCOPY;  Surgeon: [name], MD;  Location: [facility];  Service: Endoscopy;  Laterality: N/A;  . FLEXIBLE SIGMOIDOSCOPY N/A 03/06/2017   Procedure: FLEXIBLE SIGMOIDOSCOPY;  Surgeon: Yetta Flock, MD;  Location: [facility];  Service: Gastroenterology;  Laterality: N/A;  . FLEXIBLE SIGMOIDOSCOPY N/A 08/22/2017   Procedure: FLEXIBLE SIGMOIDOSCOPY;  Surgeon: Johnathan Hausen, MD;  Location: [facility];  Service: General;  Laterality: N/A;  . HIPEC procedure  07/29/2016   done at [place]  04/08/2016   IR [place] RIGHT 04/08/2016 [name], MD [facility]  . IR GENERIC HISTORICAL  04/08/2016   IR US GUIDE VASC ACCESS RIGHT 04/08/2016 [name], MD [facility]  . LAPAROSCOPIC LYSIS OF ADHESIONS N/A 11/15/2015   Procedure: LAPAROSCOPIC LYSIS OF ADHESIONS;  Surgeon: [name], MD;  Location: [facility];  Service: General;  Laterality: N/A;  . LAPAROSCOPY N/A 02/20/2015   Procedure: LAPAROSCOPY DIAGNOSTIC;  Surgeon: [name], MD;  Location: [facility];  Service: General;  Laterality: N/A;  . LAPAROSCOPY ABDOMEN DIAGNOSTIC  02/20/15   Converted to open - Sigmoid/rectal mass  . LAPAROTOMY N/A 02/20/2015   Procedure: EXPLORATORY LAPAROTOMY AND PARTIAL COLECTOMY;  Surgeon: [name], MD;  Location:  [place] [facility];  Service: General;  Laterality: N/A;  . LOW ANTERIOR BOWEL RESECTION  02/20/15   Sigmoid/rectal mass  . OSTEOTOMY     reconstructed coccyx   . reconstructed nerve in ankle  Left    bone redone  . TONSILLECTOMY      Home Medications:  Medications Prior to Admission  Medication Sig Dispense Refill  . capecitabine  (XELODA) 500 MG tablet Take 5 tablets in the AM and 4 tablets in the PM, daily for 14 days (Patient taking differently: Take 5 tablets in the AM and 5 tablets in the PM, daily for 10 days) 126 tablet 6  . fexofenadine-pseudoephedrine (ALLEGRA-D) 60-120 MG 12 hr tablet Take 1 tablet by mouth 2 (two) times daily.    . fluticasone (FLONASE) 50 MCG/ACT nasal spray Place 1 spray into both nostrils 2 (two) times daily.     Marland Kitchen ibuprofen (ADVIL) 600 MG tablet Take 600 mg by mouth every 6 (six) hours as needed.    Marland Kitchen LORazepam (ATIVAN) 1 MG tablet Take 1 mg by mouth every 8 (eight) hours.    . traMADol (ULTRAM) 50 MG tablet Take 1 tablet (50 mg total) by mouth every 6 (six) hours as needed. 90 tablet 0    Allergies:  Allergies  Allergen Reactions  . Bactrim [Sulfamethoxazole-Trimethoprim] Swelling and Other (See Comments)    Severe generalized swelling  . Capecitabine Hives and Other (See Comments)    Legs and arms, after completing therapy  . Oxaprozin Hives    Day pro  . Penicillins Hives and Other (See Comments)    Has patient had a PCN reaction causing immediate rash, facial/tongue/throat swelling, SOB or lightheadedness with hypotension: no Has patient had a PCN reaction causing severe rash involving mucus membranes or skin necrosis: {no Has patient had a PCN reaction that required hospitalization no Has patient had a PCN reaction occurring within the last 10 years: no If all of the above answers are "NO", then may proceed with Cephalosporin use.    History reviewed. No pertinent family history.  Social History:  reports that she has never smoked. She has never used smokeless tobacco. She reports current alcohol use. She reports that she does not use drugs.  ROS: A complete review of systems was performed.  All systems are negative except for pertinent findings as noted.  Physical Exam:  Vital signs in last 24 hours: Temp:  [97.1 F (36.2 C)] 97.1 F (36.2 C) (06/01 0742) Pulse Rate:   [86] 86 (06/01 0742) Resp:  [18] 18 (06/01 0742) BP: (121)/(96) 121/96 (06/01 0742) SpO2:  [100 %] 100 % (06/01 0742) Weight:  [74.4 kg] 74.4 kg (06/01 0742) General:  Alert and oriented, No acute distress HEENT: Normocephalic, atraumatic Neck: No JVD or lymphadenopathy Cardiovascular: Regular rate and rhythm Lungs: Clear bilaterally Abdomen: Soft, nontender, nondistended, no abdominal masses Back: No CVA tenderness Extremities: No edema Neurologic: Grossly intact  Laboratory Data:  No results found for this or any previous visit (from the past 24 hour(s)). Recent Results (from the past 240 hour(s))  SARS Coronavirus 2 (CEPHEID - Performed in [place] hospital lab), Hosp Order     Status: None   Collection Time: 08/21/18 11:00 AM  Result Value Ref Range Status   SARS Coronavirus 2 NEGATIVE NEGATIVE Final    Comment: (NOTE) If result is NEGATIVE SARS-CoV-2 target nucleic acids are NOT DETECTED. The SARS-CoV-2 RNA is generally detectable in upper and lower  respiratory specimens during the acute phase of infection.  The lowest  concentration of SARS-CoV-2 viral copies this assay can detect is 250  copies / mL. A negative result does not preclude SARS-CoV-2 infection  and should not be used as the sole basis for treatment or other  patient management decisions.  A negative result may occur with  improper specimen collection / handling, submission of specimen other  than nasopharyngeal swab, presence of viral mutation(s) within the  areas targeted by this assay, and inadequate number of viral copies  (<250 copies / mL). A negative result must be combined with clinical  observations, patient history, and epidemiological information. If result is POSITIVE SARS-CoV-2 target nucleic acids are DETECTED. The SARS-CoV-2 RNA is generally detectable in upper and lower  respiratory specimens dur ing the acute phase of infection.  Positive  results are indicative of active infection with  SARS-CoV-2.  Clinical  correlation with patient history and other diagnostic information is  necessary to determine patient infection status.  Positive results do  not rule out bacterial infection or co-infection with other viruses. If result is PRESUMPTIVE POSTIVE SARS-CoV-2 nucleic acids MAY BE PRESENT.   A presumptive positive result was obtained on the submitted specimen  and confirmed on repeat testing.  While 2019 novel coronavirus  (SARS-CoV-2) nucleic acids may be present in the submitted sample  additional confirmatory testing may be necessary for epidemiological  and / or clinical management purposes  to differentiate between  SARS-CoV-2 and other Sarbecovirus currently known to infect humans.  If clinically indicated additional testing with an alternate test  methodology [id number]) is advised. The SARS-CoV-2 RNA is generally  detectable in upper and lower respiratory sp ecimens during the acute  phase of infection. The expected result is Negative. Fact Sheet for Patients:  StrictlyIdeas.no Fact Sheet for Healthcare Providers: BankingDealers.co.za This test is not yet approved or cleared by the [place] FDA and has been authorized for detection and/or diagnosis of SARS-CoV-2 by FDA under an Emergency Use Authorization (EUA).  This EUA will remain in effect (meaning this test can be used) for the duration of the COVID-19 declaration under Section 564(b)(1) of the Act, 21 U.S.C. section 360bbb-3(b)(1), unless the authorization is terminated or revoked sooner. Performed at [facility], [place] [address], [place], [place] [address]    Creatinine: No results for input(s): CREATININE in the last 168 hours.  Radiologic Imaging: No results found.  Impression/Assessment:  Right hydronephrosis, status post stent placement on 12.23.2019.  Plan:  Cystoscopy, right double-J stent extraction, right retrograde  ureteral pyelogram and possible stent replacement  Lillette Boxer Edelmira Gallogly 08/24/2018, 8:56 AM  Lillette Boxer. Tishie Altmann MD

## 2018-08-24 NOTE — Anesthesia Postprocedure Evaluation (Signed)
Anesthesia Post Note  Patient: Carla Mills  Procedure(s) Performed: CYSTOSCOPY WITH RETROGRADE PYELOGRAM/URETERAL STENT EXCHANGE (Right Ureter) CYSTOSCOPY WITH STENT EXCHANGE (N/A Bladder)     Patient location during evaluation: PACU Anesthesia Type: General Level of consciousness: awake and alert Pain management: pain level controlled Vital Signs Assessment: post-procedure vital signs reviewed and stable Respiratory status: spontaneous breathing, nonlabored ventilation, respiratory function stable and patient connected to nasal cannula oxygen Cardiovascular status: blood pressure returned to baseline and stable Postop Assessment: no apparent nausea or vomiting Anesthetic complications: no    Last Vitals:  Vitals:   08/24/18 0945 08/24/18 1000  BP: 107/74 110/79  Pulse: 64 61  Resp: 13 20  Temp:    SpO2: 100% 99%    Last Pain:  Vitals:   08/24/18 1000  TempSrc:   PainSc: 0-No pain                 Tiajuana Amass

## 2018-08-24 NOTE — Discharge Instructions (Signed)
1. You may see some blood in the urine and may have some burning with urination for 48-72 hours. You also may notice that you have to urinate more frequently or urgently after your procedure which is normal.  °2. You should call should you develop an inability urinate, fever > 101, persistent nausea and vomiting that prevents you from eating or drinking to stay hydrated.  °3. If you have a stent, you will likely urinate more frequently and urgently until the stent is removed and you may experience some discomfort/pain in the lower abdomen and flank especially when urinating. You may take pain medication prescribed to you if needed for pain. You may also intermittently have blood in the urine until the stent is removed. °4. If you have a catheter, you will be taught how to take care of the catheter by the nursing staff prior to discharge from the hospital.  You may periodically feel a strong urge to void with the catheter in place.  This is a bladder spasm and most often can occur when having a bowel movement or moving around. It is typically self-limited and usually will stop after a few minutes.  You may use some Vaseline or Neosporin around the tip of the catheter to reduce friction at the tip of the penis. You may also see some blood in the urine.  A very small amount of blood can make the urine look quite red.  As long as the catheter is draining well, there usually is not a problem.  However, if the catheter is not draining well and is bloody, you should call the office ([phone]) to notify us. ° ° °Post Anesthesia Home Care Instructions ° °Activity: °Get plenty of rest for the remainder of the day. A responsible individual must stay with you for 24 hours following the procedure.  °For the next 24 hours, DO NOT: °-Drive a car °-Operate machinery °-Drink alcoholic beverages °-Take any medication unless instructed by your physician °-Make any legal decisions or sign important papers. ° °Meals: °Start with  liquid foods such as gelatin or soup. Progress to regular foods as tolerated. Avoid greasy, spicy, heavy foods. If nausea and/or vomiting occur, drink only clear liquids until the nausea and/or vomiting subsides. Call your physician if vomiting continues. ° °Special Instructions/Symptoms: °Your throat may feel dry or sore from the anesthesia or the breathing tube placed in your throat during surgery. If this causes discomfort, gargle with warm salt water. The discomfort should disappear within 24 hours. ° ° °  ° ° °

## 2018-08-24 NOTE — Op Note (Signed)
Preoperative diagnosis: Right distal ureteral stricture with hydronephrosis  Postoperative diagnosis: Same  Principal procedure: Cystoscopy, right double-J stent extraction, right retrograde ureteropyelogram, fluoroscopic interpretation, right double-J stent placement (24 cm x 6 French contour without tether)  Surgeon: Rajan Burgard  Anesthesia: General with LMA  Complications: None  Specimen: None  Drains: None  Estimated blood loss: None  Findings: Bladder appeared normal.  There were no urothelial lesions.  Ureteral orifice ease were normal in location and configuration.  Stent placed through right ureteral orifice.  Upon retrograde ureteropyelogram, there was a persistent 2 cm long tight circumferential stricture in the distal ureter, approximately 2 to 3 cm from the ureterovesical junction.  Hardly any contrast was able to be injected past this so I was unable to assess the ureter proximal to this.  Description of procedure: The patient was properly identified and marked in the holding area.  She received preoperative IV antibiotics.  She was taken to the operating room where general anesthetic was administered with the LMA.  She was placed in the dorsolithotomy position.  Genitalia and perineum were prepped and draped.  Proper timeout was performed.  76 French panendoscope was advanced into the bladder.  Circumferential inspection was performed with the above-mentioned findings noted.  The right ureteral orifice was extracted.  Using a 6 [place] open-ended catheter and Omnipaque the retrograde ureteropyelogram was performed.  It was quite obvious that the stricture was tight, and had not improved with stent management.  Because of this, I felt that stent replacement was necessary.  A guidewire was negotiated through the stricture and up to the right renal pelvis where a curl was seen.  I then negotiated a 24 cm x 6 French contour double-J stent over top of the guidewire.  Once adequate  positioning was felt, the guidewire was removed and adequate proximal and distal curls were seen using fluoroscopic and cystoscopic guidance, respectively.  At this point, the bladder was drained and the scope removed.  The tether was not left on.  The patient tolerated procedure well.  She was awakened and taken to the PACU in stable condition.

## 2018-08-25 ENCOUNTER — Encounter (HOSPITAL_BASED_OUTPATIENT_CLINIC_OR_DEPARTMENT_OTHER): Payer: Self-pay | Admitting: Urology

## 2018-09-01 ENCOUNTER — Inpatient Hospital Stay (HOSPITAL_BASED_OUTPATIENT_CLINIC_OR_DEPARTMENT_OTHER): Payer: BC Managed Care – PPO | Admitting: Hematology & Oncology

## 2018-09-01 ENCOUNTER — Inpatient Hospital Stay: Payer: BC Managed Care – PPO | Attending: Radiation Oncology

## 2018-09-01 ENCOUNTER — Other Ambulatory Visit: Payer: Self-pay

## 2018-09-01 ENCOUNTER — Encounter: Payer: Self-pay | Admitting: Hematology & Oncology

## 2018-09-01 ENCOUNTER — Inpatient Hospital Stay: Payer: BC Managed Care – PPO

## 2018-09-01 VITALS — BP 124/82 | HR 87 | Temp 98.1°F | Resp 18 | Wt 166.8 lb

## 2018-09-01 DIAGNOSIS — Z5112 Encounter for antineoplastic immunotherapy: Secondary | ICD-10-CM | POA: Diagnosis not present

## 2018-09-01 DIAGNOSIS — Z79899 Other long term (current) drug therapy: Secondary | ICD-10-CM | POA: Insufficient documentation

## 2018-09-01 DIAGNOSIS — C189 Malignant neoplasm of colon, unspecified: Secondary | ICD-10-CM | POA: Diagnosis not present

## 2018-09-01 LAB — CBC WITH DIFFERENTIAL (CANCER CENTER ONLY)
Abs Immature Granulocytes: 0.02 10*3/uL (ref 0.00–0.07)
Basophils Absolute: 0 10*3/uL (ref 0.0–0.1)
Basophils Relative: 0 %
Eosinophils Absolute: 0.3 10*3/uL (ref 0.0–0.5)
Eosinophils Relative: 4 %
HCT: 40.3 % (ref 36.0–46.0)
Hemoglobin: 13.7 g/dL (ref 12.0–15.0)
Immature Granulocytes: 0 %
Lymphocytes Relative: 15 %
Lymphs Abs: 1.1 10*3/uL (ref 0.7–4.0)
MCH: 33.8 pg (ref 26.0–34.0)
MCHC: 34 g/dL (ref 30.0–36.0)
MCV: 99.5 fL (ref 80.0–100.0)
Monocytes Absolute: 0.6 10*3/uL (ref 0.1–1.0)
Monocytes Relative: 8 %
Neutro Abs: 5.2 10*3/uL (ref 1.7–7.7)
Neutrophils Relative %: 73 %
Platelet Count: 224 10*3/uL (ref 150–400)
RBC: 4.05 MIL/uL (ref 3.87–5.11)
RDW: 12.9 % (ref 11.5–15.5)
WBC Count: 7.3 10*3/uL (ref 4.0–10.5)
nRBC: 0 % (ref 0.0–0.2)

## 2018-09-01 LAB — CMP (CANCER CENTER ONLY)
ALT: 16 U/L (ref 0–44)
AST: 19 U/L (ref 15–41)
Albumin: 4.4 g/dL (ref 3.5–5.0)
Alkaline Phosphatase: 82 U/L (ref 38–126)
Anion gap: 9 (ref 5–15)
BUN: 19 mg/dL (ref 6–20)
CO2: 25 mmol/L (ref 22–32)
Calcium: 9.7 mg/dL (ref 8.9–10.3)
Chloride: 106 mmol/L (ref 98–111)
Creatinine: 0.83 mg/dL (ref 0.44–1.00)
GFR, Est AFR Am: >60 mL/min (ref >60)
GFR, Estimated: >60 mL/min (ref >60)
Glucose, Bld: 105 mg/dL — ABNORMAL HIGH (ref 70–99)
Potassium: 3.9 mmol/L (ref 3.5–5.1)
Sodium: 140 mmol/L (ref 135–145)
Total Bilirubin: 0.6 mg/dL (ref 0.3–1.2)
Total Protein: 6.8 g/dL (ref 6.5–8.1)

## 2018-09-01 LAB — CEA (IN HOUSE-CHCC): CEA (CHCC-In House): 3.22 ng/mL (ref 0.00–5.00)

## 2018-09-01 LAB — LACTATE DEHYDROGENASE: LDH: 172 U/L (ref 98–192)

## 2018-09-01 MED ORDER — SODIUM CHLORIDE 0.9 % IV SOLN
Freq: Once | INTRAVENOUS | Status: AC
  Administered 2018-09-01: 09:00:00 via INTRAVENOUS
  Filled 2018-09-01: qty 250

## 2018-09-01 MED ORDER — HEPARIN SOD (PORK) LOCK FLUSH 100 UNIT/ML IV SOLN
500.0000 [IU] | Freq: Once | INTRAVENOUS | Status: AC | PRN
  Administered 2018-09-01: 500 [IU]
  Filled 2018-09-01: qty 5

## 2018-09-01 MED ORDER — HYDROCORTISONE ACE-PRAMOXINE 1-1 % EX CREA: 1.0000 "application " | TOPICAL_CREAM | Freq: Two times a day (BID) | CUTANEOUS | 4 refills | Status: DC

## 2018-09-01 MED ORDER — SODIUM CHLORIDE 0.9 % IV SOLN
7.5000 mg/kg | Freq: Once | INTRAVENOUS | Status: AC
  Administered 2018-09-01: 550 mg via INTRAVENOUS
  Filled 2018-09-01: qty 16

## 2018-09-01 MED ORDER — SODIUM CHLORIDE 0.9% FLUSH
10.0000 mL | INTRAVENOUS | Status: DC | PRN
  Administered 2018-09-01: 10 mL
  Filled 2018-09-01: qty 10

## 2018-09-01 MED FILL — HYDROCORT-PRAMOXINE 1%-1% C: 1-1 | 30 days supply | Qty: 30 | Fill #0

## 2018-09-01 NOTE — Patient Instructions (Signed)

## 2018-09-01 NOTE — Progress Notes (Signed)
Hematology and Oncology Follow Up Visit  Carla Mills [id number] 06/08/1970 48 y.o. 09/01/2018   Principle Diagnosis:  Recurrent colon cancer - HER2 (-)/KRAS mutant/MSI stable/MMR normal/BRAF (wt) -   Past Therapy: FOLFOXIRI - s/p cycle #12 -completed on 11/12/2016 Radiation therapy with Xeloda - completed on 06/06/2017 Status post exploratory laparotomy with HIPEC - 07/29/2016 FOLFOXIRI/Avastin - s/p cycle #8 (Avastin on hold)  Current Therapy:   Xeloda/Avastin - cycle #5 on 04/08/2018  ( Xeloda is 2500 po BID x 10 day)   Interim History:  Carla Mills is here today for for follow-up.  She really looks good.  She feels good.  She had a ureteral stent replaced a couple weeks ago.  She tolerated this pretty well.  She has had some discharge from the anus.  We will try her on some ProctoCream HC to see if this can help a little bit.  The 10 days of Xeloda works pretty well for her.  Her hands and feet are not doing too bad.  He feels probably a little worse in the hands.  She is very liberal is using lotion.  She had her daughter's high school graduation yesterday.  This was 1 of those drive-through graduation because of the coronavirus.  The sort of sad that he did not have a formal graduation.  Her colostomy is working quite well.  She is staying active.  She is walking quite a bit.  She has had no issues with nausea or vomiting.  There is no bleeding.  There is no cough.  Overall, her performance status is ECOG 1.  Medications:  Allergies as of 09/01/2018      Reactions   Bactrim [sulfamethoxazole-trimethoprim] Swelling, Other (See Comments)   Severe generalized swelling   Capecitabine Hives, Other (See Comments)   Legs and arms, after completing therapy   Oxaprozin Hives   Day pro   Penicillins Hives, Other (See Comments)   Has patient had a PCN reaction causing immediate rash, facial/tongue/throat swelling, SOB or lightheadedness with hypotension: no Has  patient had a PCN reaction causing severe rash involving mucus membranes or skin necrosis: {no Has patient had a PCN reaction that required hospitalization no Has patient had a PCN reaction occurring within the last 10 years: no If all of the above answers are "NO", then may proceed with Cephalosporin use.      Medication List       Accurate as of September 01, 2018  9:04 AM. If you have any questions, ask your nurse or doctor.        capecitabine 500 MG tablet Commonly known as:  XELODA Take 5 tablets in the AM and 4 tablets in the PM, daily for 14 days What changed:  additional instructions   fexofenadine-pseudoephedrine 60-120 MG 12 hr tablet Commonly known as:  ALLEGRA-D Take 1 tablet by mouth 2 (two) times daily.   fluticasone 50 MCG/ACT nasal spray Commonly known as:  FLONASE Place 1 spray into both nostrils 2 (two) times daily.   ibuprofen 600 MG tablet Commonly known as:  ADVIL Take 600 mg by mouth every 6 (six) hours as needed.   LORazepam 1 MG tablet Commonly known as:  ATIVAN Take 1 mg by mouth every 8 (eight) hours.   traMADol 50 MG tablet Commonly known as:  ULTRAM Take 1 tablet (50 mg total) by mouth every 6 (six) hours as needed.       Allergies:  Allergies  Allergen Reactions  . Bactrim [Sulfamethoxazole-Trimethoprim] Swelling and  Other (See Comments)    Severe generalized swelling  . Capecitabine Hives and Other (See Comments)    Legs and arms, after completing therapy  . Oxaprozin Hives    Day pro  . Penicillins Hives and Other (See Comments)    Has patient had a PCN reaction causing immediate rash, facial/tongue/throat swelling, SOB or lightheadedness with hypotension: no Has patient had a PCN reaction causing severe rash involving mucus membranes or skin necrosis: {no Has patient had a PCN reaction that required hospitalization no Has patient had a PCN reaction occurring within the last 10 years: no If all of the above answers are "NO", then may  proceed with Cephalosporin use.    Past Medical History, Surgical history, Social history, and Family History were reviewed and updated.  Review of Systems: Review of Systems  Constitutional: Negative.   HENT: Negative.   Eyes: Negative.   Respiratory: Negative.   Cardiovascular: Negative.   Gastrointestinal: Negative.   Genitourinary: Negative.   Musculoskeletal: Negative.   Skin: Negative.   Neurological: Negative.   Endo/Heme/Allergies: Negative.   Psychiatric/Behavioral: Negative.       Physical Exam:  weight is 166 lb 12 oz (75.6 kg). Her oral temperature is 98.1 F (36.7 C). Her blood pressure is 124/82 and her pulse is 87. Her respiration is 18 and oxygen saturation is 100%.   Wt Readings from Last 3 Encounters:  09/01/18 166 lb 12 oz (75.6 kg)  08/24/18 164 lb (74.4 kg)  08/04/18 168 lb 4 oz (76.3 kg)    Her vital signs show a temperature 97.6.  Pulse 76.  Blood pressure 114/75.  Physical Exam Vitals signs reviewed.  HENT:     Head: Normocephalic and atraumatic.  Eyes:     Pupils: Pupils are equal, round, and reactive to light.  Neck:     Musculoskeletal: Normal range of motion.  Cardiovascular:     Rate and Rhythm: Normal rate and regular rhythm.     Heart sounds: Normal heart sounds.  Pulmonary:     Effort: Pulmonary effort is normal.     Breath sounds: Normal breath sounds.  Abdominal:     General: Bowel sounds are normal.     Palpations: Abdomen is soft.     Comments: Abdominal exam shows laparotomy scars that are well-healed.  She has a colostomy in the left lower quadrant.  She has no fluid wave.  There is no guarding or rebound tenderness.  She has no palpable liver or spleen tip.  Musculoskeletal: Normal range of motion.        General: No tenderness or deformity.  Lymphadenopathy:     Cervical: No cervical adenopathy.  Skin:    General: Skin is warm and dry.     Findings: No erythema or rash.  Neurological:     Mental Status: She is alert  and oriented to person, place, and time.  Psychiatric:        Behavior: Behavior normal.        Thought Content: Thought content normal.        Judgment: Judgment normal.      Lab Results  Component Value Date   WBC 7.3 09/01/2018   HGB 13.7 09/01/2018   HCT 40.3 09/01/2018   MCV 99.5 09/01/2018   PLT 224 09/01/2018   Lab Results  Component Value Date   FERRITIN 784 (H) 06/16/2018   IRON 139 06/16/2018   TIBC 321 06/16/2018   UIBC 182 06/16/2018   IRONPCTSAT 43 06/16/2018  Lab Results  Component Value Date   RETICCTPCT 0.8 02/12/2015   RBC 4.05 09/01/2018   No results found for: KPAFRELGTCHN, LAMBDASER, KAPLAMBRATIO No results found for: IGGSERUM, IGA, IGMSERUM No results found for: Kathrynn Ducking, MSPIKE, SPEI   Chemistry      Component Value Date/Time   NA 141 08/04/2018 0754   NA 145 03/27/2017 1101   NA 141 07/30/2016 0750   K 4.0 08/04/2018 0754   K 4.5 03/27/2017 1101   K 4.2 07/30/2016 0750   CL 106 08/04/2018 0754   CL 103 03/27/2017 1101   CO2 26 08/04/2018 0754   CO2 28 03/27/2017 1101   CO2 26 07/30/2016 0750   BUN 17 08/04/2018 0754   BUN 12 03/27/2017 1101   BUN 12.1 07/30/2016 0750   CREATININE 0.82 08/04/2018 0754   CREATININE 0.9 03/27/2017 1101   CREATININE 0.7 07/30/2016 0750      Component Value Date/Time   CALCIUM 9.6 08/04/2018 0754   CALCIUM 9.6 03/27/2017 1101   CALCIUM 9.5 07/30/2016 0750   ALKPHOS 87 08/04/2018 0754   ALKPHOS 101 (H) 03/27/2017 1101   ALKPHOS 72 07/30/2016 0750   AST 19 08/04/2018 0754   AST 22 07/30/2016 0750   ALT 17 08/04/2018 0754   ALT 20 03/27/2017 1101   ALT 22 07/30/2016 0750   BILITOT 0.7 08/04/2018 0754   BILITOT 0.54 07/30/2016 0750       Impression and Plan: Ms. Rowe is a very pleasant 48 yo caucasian female with recurrent colon cancer - HER2 (-)/KRAS (+)/MSI stable/MMR normal/BRAF -.   We will go ahead with her Avastin today.  I do think  this is helping her out.  We will see her back in a month.  After her next Avastin/Xeloda cycle, we will then do a PET scan on her.     Volanda Napoleon, MD 6/9/20209:04 AM

## 2018-09-01 NOTE — Patient Instructions (Signed)
Bevacizumab injection  What is this medicine?  BEVACIZUMAB (be va SIZ yoo mab) is a monoclonal antibody. It is used to treat many types of cancer.  This medicine may be used for other purposes; ask your health care provider or pharmacist if you have questions.  COMMON BRAND NAME(S): Avastin, MVASI  What should I tell my health care provider before I take this medicine?  They need to know if you have any of these conditions:  -diabetes  -heart disease  -high blood pressure  -history of coughing up blood  -prior anthracycline chemotherapy (e.g., doxorubicin, daunorubicin, epirubicin)  -recent or ongoing radiation therapy  -recent or planning to have surgery  -stroke  -an unusual or allergic reaction to bevacizumab, hamster proteins, mouse proteins, other medicines, foods, dyes, or preservatives  -pregnant or trying to get pregnant  -breast-feeding  How should I use this medicine?  This medicine is for infusion into a vein. It is given by a health care professional in a hospital or clinic setting.  Talk to your pediatrician regarding the use of this medicine in children. Special care may be needed.  Overdosage: If you think you have taken too much of this medicine contact a poison control center or emergency room at once.  NOTE: This medicine is only for you. Do not share this medicine with others.  What if I miss a dose?  It is important not to miss your dose. Call your doctor or health care professional if you are unable to keep an appointment.  What may interact with this medicine?  Interactions are not expected.  This list may not describe all possible interactions. Give your health care provider a list of all the medicines, herbs, non-prescription drugs, or dietary supplements you use. Also tell them if you smoke, drink alcohol, or use illegal drugs. Some items may interact with your medicine.  What should I watch for while using this medicine?  Your condition will be monitored carefully while you are receiving  this medicine. You will need important blood work and urine testing done while you are taking this medicine.  This medicine may increase your risk to bruise or bleed. Call your doctor or health care professional if you notice any unusual bleeding.  This medicine should be started at least 28 days following major surgery and the site of the surgery should be totally healed. Check with your doctor before scheduling dental work or surgery while you are receiving this treatment. Talk to your doctor if you have recently had surgery or if you have a wound that has not healed.  Do not become pregnant while taking this medicine or for 6 months after stopping it. Women should inform their doctor if they wish to become pregnant or think they might be pregnant. There is a potential for serious side effects to an unborn child. Talk to your health care professional or pharmacist for more information. Do not breast-feed an infant while taking this medicine and for 6 months after the last dose.  This medicine has caused ovarian failure in some women. This medicine may interfere with the ability to have a child. You should talk to your doctor or health care professional if you are concerned about your fertility.  What side effects may I notice from receiving this medicine?  Side effects that you should report to your doctor or health care professional as soon as possible:  -allergic reactions like skin rash, itching or hives, swelling of the face, lips, or tongue  -  chest pain or chest tightness  -chills  -coughing up blood  -high fever  -seizures  -severe constipation  -signs and symptoms of bleeding such as bloody or black, tarry stools; red or dark-brown urine; spitting up blood or brown material that looks like coffee grounds; red spots on the skin; unusual bruising or bleeding from the eye, gums, or nose  -signs and symptoms of a blood clot such as breathing problems; chest pain; severe, sudden headache; pain, swelling, warmth  in the leg  -signs and symptoms of a stroke like changes in vision; confusion; trouble speaking or understanding; severe headaches; sudden numbness or weakness of the face, arm or leg; trouble walking; dizziness; loss of balance or coordination  -stomach pain  -sweating  -swelling of legs or ankles  -vomiting  -weight gain  Side effects that usually do not require medical attention (report to your doctor or health care professional if they continue or are bothersome):  -back pain  -changes in taste  -decreased appetite  -dry skin  -nausea  -tiredness  This list may not describe all possible side effects. Call your doctor for medical advice about side effects. You may report side effects to FDA at 1-800-FDA-1088.  Where should I keep my medicine?  This drug is given in a hospital or clinic and will not be stored at home.  NOTE: This sheet is a summary. It may not cover all possible information. If you have questions about this medicine, talk to your doctor, pharmacist, or health care provider.   2019 Elsevier/Gold Standard (2016-03-08 14:33:29)

## 2018-09-07 ENCOUNTER — Telehealth: Payer: Self-pay | Admitting: Hematology & Oncology

## 2018-09-07 ENCOUNTER — Other Ambulatory Visit: Payer: Self-pay | Admitting: Family

## 2018-09-07 ENCOUNTER — Encounter (HOSPITAL_BASED_OUTPATIENT_CLINIC_OR_DEPARTMENT_OTHER): Payer: Self-pay

## 2018-09-07 ENCOUNTER — Other Ambulatory Visit: Payer: Self-pay

## 2018-09-07 ENCOUNTER — Ambulatory Visit (HOSPITAL_BASED_OUTPATIENT_CLINIC_OR_DEPARTMENT_OTHER)
Admission: RE | Admit: 2018-09-07 | Discharge: 2018-09-07 | Disposition: A | Discharge status: Home / Self Care | Payer: BC Managed Care – PPO | Source: Ambulatory Visit | Attending: Family | Admitting: Family

## 2018-09-07 ENCOUNTER — Telehealth: Payer: Self-pay | Admitting: *Deleted

## 2018-09-07 ENCOUNTER — Inpatient Hospital Stay: Payer: BC Managed Care – PPO

## 2018-09-07 DIAGNOSIS — C189 Malignant neoplasm of colon, unspecified: Secondary | ICD-10-CM | POA: Diagnosis not present

## 2018-09-07 MED ORDER — METRONIDAZOLE 500 MG PO TABS: 500.0000 mg | ORAL_TABLET | Freq: Three times a day (TID) | ORAL | 0 refills | Status: AC

## 2018-09-07 MED ORDER — CIPROFLOXACIN HCL 500 MG PO TABS: 500.0000 mg | ORAL_TABLET | Freq: Two times a day (BID) | ORAL | 0 refills | Status: AC

## 2018-09-07 MED ORDER — IOHEXOL 300 MG/ML  SOLN
100.0000 mL | Freq: Once | INTRAMUSCULAR | Status: AC | PRN
  Administered 2018-09-07: 100 mL via INTRAVENOUS

## 2018-09-07 MED FILL — metroNIDAZOLE 500 MG TABS: 500 | 10 days supply | Qty: 30 | Fill #0

## 2018-09-07 MED FILL — CIPROFLOXACIN HCL 500 MG TA: 500 | 10 days supply | Qty: 20 | Fill #0

## 2018-09-07 NOTE — Patient Instructions (Signed)

## 2018-09-07 NOTE — Telephone Encounter (Signed)
I called [name] this afternoon.  We will result back from her CT scan.  She may have an abscess where she is complaining of pain.  We will try her on antibiotics.  We will try her on a combination of ciprofloxacin and Flagyl.  I will call these in downstairs for her.  I told her that if this is not better with respect to her pain and discharge, Thursday, that she should give me a call and we might have to get her to surgery to try to help out with this problem.  I just feel bad that she has this issue right now.  She has been doing quite well.  Lattie Haw, MD

## 2018-09-07 NOTE — Telephone Encounter (Signed)
Received a call from patient complaining of a half dollar size area on her left buttock close to rectum that is swollen, warm to touch and extremely painful.  No drainage noted.  Patient is unable to sit down it is so painful.  Dr. Marin Olp notified ordered a CT scan of pelvis to be done today.

## 2018-09-09 ENCOUNTER — Telehealth: Payer: Self-pay | Admitting: *Deleted

## 2018-09-09 NOTE — Telephone Encounter (Signed)
Call placed to patient to notify her that Dr. Marin Olp would like for Carla Mills to see Dr. Crisoforo Oxford and that we are awaiting a call back from Dr. Crisoforo Oxford for Dr. Marin Olp to speak with him and that we will call her once call is received.  Carla Mills appreciative of update.

## 2018-09-09 NOTE — Telephone Encounter (Signed)
Message received from patient stating that the area on her bottom is raw, sore, appears to be white and would like to know if she should come in for [facility] to assess her.  Dr. Marin Olp notified and would like for pt to see Dr. Doyce Para.  Call placed to Dr. Crisoforo Oxford to speak with Dr. Marin Olp.  Call placed back to patient to notify her that Dr. Marin Olp would like for her to see Dr. Crisoforo Oxford.  Pt states that she could also see Dr. Rosendo Gros if Dr. Marin Olp feels as though that is appropriate. Pt also states that she has had "fluid flowing from rectum which looks like food" for the last week or so.  Informed pt that I would speak with Dr. Marin Olp and call her back with further orders.

## 2018-09-10 ENCOUNTER — Telehealth: Payer: Self-pay | Admitting: *Deleted

## 2018-09-10 NOTE — Telephone Encounter (Signed)
Call placed to patient to inform her that Dr. Marin Olp spoke with Dr. Crisoforo Oxford this morning and that Dr. Crisoforo Oxford would like to see patient tomorrow.  Call placed to Dr. Lyda Jester office and appt made for 1:00PM for patient tomorrow, 09/11/18. Call placed to patient and patient notified of appt to see Dr. Crisoforo Oxford tomorrow at 1:00PM.  Patient appreciative of call and appt time.  Pt states that she is now having brown flecks in her urine, fluid from rectum continues and that she has no fever or chills.  Dr. Marin Olp notified.

## 2018-09-11 DIAGNOSIS — K6289 Other specified diseases of anus and rectum: Secondary | ICD-10-CM | POA: Diagnosis not present

## 2018-09-11 DIAGNOSIS — L0231 Cutaneous abscess of buttock: Secondary | ICD-10-CM | POA: Diagnosis not present

## 2018-09-11 DIAGNOSIS — N898 Other specified noninflammatory disorders of vagina: Secondary | ICD-10-CM | POA: Diagnosis not present

## 2018-09-11 DIAGNOSIS — Z933 Colostomy status: Secondary | ICD-10-CM | POA: Diagnosis not present

## 2018-09-18 DIAGNOSIS — N824 Other female intestinal-genital tract fistulae: Secondary | ICD-10-CM | POA: Diagnosis not present

## 2018-09-18 DIAGNOSIS — C189 Malignant neoplasm of colon, unspecified: Secondary | ICD-10-CM | POA: Diagnosis not present

## 2018-09-23 ENCOUNTER — Telehealth: Payer: Self-pay | Admitting: *Deleted

## 2018-09-23 DIAGNOSIS — N131 Hydronephrosis with ureteral stricture, not elsewhere classified: Secondary | ICD-10-CM | POA: Diagnosis not present

## 2018-09-23 DIAGNOSIS — R8271 Bacteriuria: Secondary | ICD-10-CM | POA: Diagnosis not present

## 2018-09-23 NOTE — Telephone Encounter (Signed)
Patient has seen Dr Crisoforo Oxford and has plans for surgery on July 22nd. She has an appointment with [place] next week and wants to know if she needs to keep this.   Spoke with Dr Marin Olp. He does want patient to come in for her appointment next week, however her treatment will be held. Patient aware of Dr Antonieta Pert response.

## 2018-09-29 ENCOUNTER — Encounter: Payer: Self-pay | Admitting: Hematology & Oncology

## 2018-09-29 ENCOUNTER — Inpatient Hospital Stay: Payer: BC Managed Care – PPO | Attending: Radiation Oncology

## 2018-09-29 ENCOUNTER — Inpatient Hospital Stay: Payer: BC Managed Care – PPO

## 2018-09-29 ENCOUNTER — Other Ambulatory Visit: Payer: Self-pay

## 2018-09-29 ENCOUNTER — Ambulatory Visit: Payer: BLUE CROSS/BLUE SHIELD

## 2018-09-29 ENCOUNTER — Inpatient Hospital Stay (HOSPITAL_BASED_OUTPATIENT_CLINIC_OR_DEPARTMENT_OTHER): Payer: BC Managed Care – PPO | Admitting: Hematology & Oncology

## 2018-09-29 VITALS — BP 126/75 | HR 70 | Temp 97.1°F | Resp 18 | Ht 65.0 in | Wt 165.0 lb

## 2018-09-29 DIAGNOSIS — N824 Other female intestinal-genital tract fistulae: Secondary | ICD-10-CM | POA: Diagnosis not present

## 2018-09-29 DIAGNOSIS — C189 Malignant neoplasm of colon, unspecified: Secondary | ICD-10-CM | POA: Diagnosis not present

## 2018-09-29 DIAGNOSIS — Z923 Personal history of irradiation: Secondary | ICD-10-CM

## 2018-09-29 LAB — CMP (CANCER CENTER ONLY)
ALT: 12 U/L (ref 0–44)
AST: 17 U/L (ref 15–41)
Albumin: 4.3 g/dL (ref 3.5–5.0)
Alkaline Phosphatase: 67 U/L (ref 38–126)
Anion gap: 9 (ref 5–15)
BUN: 15 mg/dL (ref 6–20)
CO2: 26 mmol/L (ref 22–32)
Calcium: 9 mg/dL (ref 8.9–10.3)
Chloride: 105 mmol/L (ref 98–111)
Creatinine: 0.76 mg/dL (ref 0.44–1.00)
GFR, Est AFR Am: >60 mL/min (ref >60)
GFR, Estimated: >60 mL/min (ref >60)
Glucose, Bld: 108 mg/dL — ABNORMAL HIGH (ref 70–99)
Potassium: 4 mmol/L (ref 3.5–5.1)
Sodium: 140 mmol/L (ref 135–145)
Total Bilirubin: 0.7 mg/dL (ref 0.3–1.2)
Total Protein: 6.6 g/dL (ref 6.5–8.1)

## 2018-09-29 LAB — CBC WITH DIFFERENTIAL (CANCER CENTER ONLY)
Abs Immature Granulocytes: 0.01 10*3/uL (ref 0.00–0.07)
Basophils Absolute: 0 10*3/uL (ref 0.0–0.1)
Basophils Relative: 1 %
Eosinophils Absolute: 0.2 10*3/uL (ref 0.0–0.5)
Eosinophils Relative: 4 %
HCT: 39.2 % (ref 36.0–46.0)
Hemoglobin: 13 g/dL (ref 12.0–15.0)
Immature Granulocytes: 0 %
Lymphocytes Relative: 21 %
Lymphs Abs: 0.9 10*3/uL (ref 0.7–4.0)
MCH: 33.4 pg (ref 26.0–34.0)
MCHC: 33.2 g/dL (ref 30.0–36.0)
MCV: 100.8 fL — ABNORMAL HIGH (ref 80.0–100.0)
Monocytes Absolute: 0.4 10*3/uL (ref 0.1–1.0)
Monocytes Relative: 9 %
Neutro Abs: 2.8 10*3/uL (ref 1.7–7.7)
Neutrophils Relative %: 65 %
Platelet Count: 186 10*3/uL (ref 150–400)
RBC: 3.89 MIL/uL (ref 3.87–5.11)
RDW: 13.2 % (ref 11.5–15.5)
WBC Count: 4.3 10*3/uL (ref 4.0–10.5)
nRBC: 0 % (ref 0.0–0.2)

## 2018-09-29 LAB — LACTATE DEHYDROGENASE: LDH: 170 U/L (ref 98–192)

## 2018-09-29 LAB — CEA (IN HOUSE-CHCC): CEA (CHCC-In House): 2.57 ng/mL (ref 0.00–5.00)

## 2018-09-29 NOTE — Progress Notes (Signed)
Hematology and Oncology Follow Up Visit  Carla Mills [id number] 06/06/1970 48 y.o. 09/29/2018   Principle Diagnosis:  Recurrent colon cancer - HER2 (-)/KRAS mutant/MSI stable/MMR normal/BRAF (wt) -   Past Therapy: FOLFOXIRI - s/p cycle #12 -completed on 11/12/2016 Radiation therapy with Xeloda - completed on 06/06/2017 Status post exploratory laparotomy with HIPEC - 07/29/2016 FOLFOXIRI/Avastin - s/p cycle #8 (Avastin on hold)  Current Therapy:   Xeloda/Avastin - cycle #7 - started on 04/08/2018  ( Xeloda is 2500 po BID x 10 day) -- d/c on 09/29/2018 due to fistula   Interim History:  Carla Mills is here today for for follow-up.  Unfortunately, going to have to stop her treatments.  She clearly has a complication from the Avastin.  She has a fistula formation.  She was seen at [facility] by Dr. Crisoforo Oxford.  He reviewed her CT scans.  He found that she had fistula formation.  She is going to have surgery on July 22.  It sounds like he can correct this.  Again, we are going to have to stop her treatment.  She really wants to stop her treatments.  I cannot argue with this.  I think that she really needs a "holiday" from her treatment.  I think given the Xeloda we should hold for right now.  So far, we have had no evidence of active disease.  I am sure when she has surgery, Dr. Crisoforo Oxford will look around and see if he sees anything.  She is really not having much in the way of pain.  She is having no problems with her colostomy.  She is having no nausea or vomiting.  She has had no bleeding.  Overall, I also performed status is ECOG 1.  Allergies as of 09/29/2018      Reactions   Bactrim [sulfamethoxazole-trimethoprim] Swelling, Other (See Comments)   Severe generalized swelling   Capecitabine Hives, Other (See Comments)   Legs and arms, after completing therapy   Oxaprozin Hives   Day pro   Penicillins Hives, Other (See Comments)   Has patient had a PCN reaction causing immediate  rash, facial/tongue/throat swelling, SOB or lightheadedness with hypotension: no Has patient had a PCN reaction causing severe rash involving mucus membranes or skin necrosis: {no Has patient had a PCN reaction that required hospitalization no Has patient had a PCN reaction occurring within the last 10 years: no If all of the above answers are "NO", then may proceed with Cephalosporin use.      Medication List       Accurate as of September 29, 2018  9:15 AM. If you have any questions, ask your nurse or doctor.        capecitabine 500 MG tablet Commonly known as: XELODA Take 5 tablets in the AM and 4 tablets in the PM, daily for 14 days What changed: additional instructions   fexofenadine-pseudoephedrine 60-120 MG 12 hr tablet Commonly known as: ALLEGRA-D Take 1 tablet by mouth 2 (two) times daily.   fluticasone 50 MCG/ACT nasal spray Commonly known as: FLONASE Place 1 spray into both nostrils 2 (two) times daily.   ibuprofen 600 MG tablet Commonly known as: ADVIL Take 600 mg by mouth every 6 (six) hours as needed.   LORazepam 1 MG tablet Commonly known as: ATIVAN Take 1 mg by mouth every 8 (eight) hours.   pramoxine-hydrocortisone 1-1 % rectal cream Commonly known as: PROCTOCREAM-HC Place 1 application rectally 2 (two) times daily.   traMADol 50 MG tablet Commonly known  as: ULTRAM Take 1 tablet (50 mg total) by mouth every 6 (six) hours as needed.       Allergies:  Allergies  Allergen Reactions  . Bactrim [Sulfamethoxazole-Trimethoprim] Swelling and Other (See Comments)    Severe generalized swelling  . Capecitabine Hives and Other (See Comments)    Legs and arms, after completing therapy  . Oxaprozin Hives    Day pro  . Penicillins Hives and Other (See Comments)    Has patient had a PCN reaction causing immediate rash, facial/tongue/throat swelling, SOB or lightheadedness with hypotension: no Has patient had a PCN reaction causing severe rash involving mucus  membranes or skin necrosis: {no Has patient had a PCN reaction that required hospitalization no Has patient had a PCN reaction occurring within the last 10 years: no If all of the above answers are "NO", then may proceed with Cephalosporin use.    Past Medical History, Surgical history, Social history, and Family History were reviewed and updated.  Review of Systems: Review of Systems  Constitutional: Negative.   HENT: Negative.   Eyes: Negative.   Respiratory: Negative.   Cardiovascular: Negative.   Gastrointestinal: Negative.   Genitourinary: Negative.   Musculoskeletal: Negative.   Skin: Negative.   Neurological: Negative.   Endo/Heme/Allergies: Negative.   Psychiatric/Behavioral: Negative.       Physical Exam:  height is '5\' 5"'  (1.651 m) and weight is 165 lb (74.8 kg). Her oral temperature is 97.1 F (36.2 C) (abnormal). Her blood pressure is 126/75 and her pulse is 70. Her respiration is 18 and oxygen saturation is 100%.   Wt Readings from Last 3 Encounters:  09/29/18 165 lb (74.8 kg)  09/01/18 166 lb 12 oz (75.6 kg)  08/24/18 164 lb (74.4 kg)    Her vital signs show a temperature 97.6.  Pulse 76.  Blood pressure 114/75.  Physical Exam Vitals signs reviewed.  HENT:     Head: Normocephalic and atraumatic.  Eyes:     Pupils: Pupils are equal, round, and reactive to light.  Neck:     Musculoskeletal: Normal range of motion.  Cardiovascular:     Rate and Rhythm: Normal rate and regular rhythm.     Heart sounds: Normal heart sounds.  Pulmonary:     Effort: Pulmonary effort is normal.     Breath sounds: Normal breath sounds.  Abdominal:     General: Bowel sounds are normal.     Palpations: Abdomen is soft.     Comments: Abdominal exam shows laparotomy scars that are well-healed.  She has a colostomy in the left lower quadrant.  She has no fluid wave.  There is no guarding or rebound tenderness.  She has no palpable liver or spleen tip.  Musculoskeletal: Normal  range of motion.        General: No tenderness or deformity.  Lymphadenopathy:     Cervical: No cervical adenopathy.  Skin:    General: Skin is warm and dry.     Findings: No erythema or rash.  Neurological:     Mental Status: She is alert and oriented to person, place, and time.  Psychiatric:        Behavior: Behavior normal.        Thought Content: Thought content normal.        Judgment: Judgment normal.      Lab Results  Component Value Date   WBC 4.3 09/29/2018   HGB 13.0 09/29/2018   HCT 39.2 09/29/2018   MCV 100.8 (H) 09/29/2018  PLT 186 09/29/2018   Lab Results  Component Value Date   FERRITIN 784 (H) 06/16/2018   IRON 139 06/16/2018   TIBC 321 06/16/2018   UIBC 182 06/16/2018   IRONPCTSAT 43 06/16/2018   Lab Results  Component Value Date   RETICCTPCT 0.8 02/12/2015   RBC 3.89 09/29/2018   No results found for: KPAFRELGTCHN, LAMBDASER, KAPLAMBRATIO No results found for: IGGSERUM, IGA, IGMSERUM No results found for: Odetta Pink, SPEI   Chemistry      Component Value Date/Time   NA 140 09/29/2018 0815   NA 145 03/27/2017 1101   NA 141 07/30/2016 0750   K 4.0 09/29/2018 0815   K 4.5 03/27/2017 1101   K 4.2 07/30/2016 0750   CL 105 09/29/2018 0815   CL 103 03/27/2017 1101   CO2 26 09/29/2018 0815   CO2 28 03/27/2017 1101   CO2 26 07/30/2016 0750   BUN 15 09/29/2018 0815   BUN 12 03/27/2017 1101   BUN 12.1 07/30/2016 0750   CREATININE 0.76 09/29/2018 0815   CREATININE 0.9 03/27/2017 1101   CREATININE 0.7 07/30/2016 0750      Component Value Date/Time   CALCIUM 9.0 09/29/2018 0815   CALCIUM 9.6 03/27/2017 1101   CALCIUM 9.5 07/30/2016 0750   ALKPHOS 67 09/29/2018 0815   ALKPHOS 101 (H) 03/27/2017 1101   ALKPHOS 72 07/30/2016 0750   AST 17 09/29/2018 0815   AST 22 07/30/2016 0750   ALT 12 09/29/2018 0815   ALT 20 03/27/2017 1101   ALT 22 07/30/2016 0750   BILITOT 0.7 09/29/2018 0815    BILITOT 0.54 07/30/2016 0750       Impression and Plan: Ms. Straker is a very pleasant 48 yo caucasian female with recurrent colon cancer - HER2 (-)/KRAS (+)/MSI stable/MMR normal/BRAF -.   For right now, we will just put a stop to her treatments.  I will see any problems with doing this.  I am sure that we will have to reinitiate some form of therapy on her down the road.  Hopefully, we can get her through the rest of this year without having to do any treatment on her.  I know our treatment options are becoming much more limited at this point.  We certainly cannot use any anti-VGEF agents.  I would like to see her back in about 6 weeks after her surgery.  I probably would do another PET scan on her in September or October.       Volanda Napoleon, MD 7/7/20209:15 AM

## 2018-09-29 NOTE — Patient Instructions (Signed)

## 2018-10-09 DIAGNOSIS — Z01812 Encounter for preprocedural laboratory examination: Secondary | ICD-10-CM | POA: Diagnosis not present

## 2018-10-09 DIAGNOSIS — N824 Other female intestinal-genital tract fistulae: Secondary | ICD-10-CM | POA: Diagnosis not present

## 2018-10-09 DIAGNOSIS — Z1159 Encounter for screening for other viral diseases: Secondary | ICD-10-CM | POA: Diagnosis not present

## 2018-10-13 DIAGNOSIS — J309 Allergic rhinitis, unspecified: Secondary | ICD-10-CM | POA: Diagnosis not present

## 2018-10-13 DIAGNOSIS — K565 Intestinal adhesions [bands], unspecified as to partial versus complete obstruction: Secondary | ICD-10-CM | POA: Diagnosis not present

## 2018-10-13 DIAGNOSIS — Z933 Colostomy status: Secondary | ICD-10-CM | POA: Diagnosis not present

## 2018-10-13 DIAGNOSIS — R109 Unspecified abdominal pain: Secondary | ICD-10-CM | POA: Diagnosis not present

## 2018-10-13 DIAGNOSIS — F419 Anxiety disorder, unspecified: Secondary | ICD-10-CM | POA: Diagnosis not present

## 2018-10-13 DIAGNOSIS — Z9221 Personal history of antineoplastic chemotherapy: Secondary | ICD-10-CM | POA: Diagnosis not present

## 2018-10-13 DIAGNOSIS — N824 Other female intestinal-genital tract fistulae: Secondary | ICD-10-CM | POA: Diagnosis not present

## 2018-10-13 DIAGNOSIS — N822 Fistula of vagina to small intestine: Secondary | ICD-10-CM | POA: Diagnosis not present

## 2018-10-13 DIAGNOSIS — Z888 Allergy status to other drugs, medicaments and biological substances status: Secondary | ICD-10-CM | POA: Diagnosis not present

## 2018-10-13 DIAGNOSIS — G8918 Other acute postprocedural pain: Secondary | ICD-10-CM | POA: Diagnosis not present

## 2018-10-13 DIAGNOSIS — G600 Hereditary motor and sensory neuropathy: Secondary | ICD-10-CM | POA: Diagnosis not present

## 2018-10-13 DIAGNOSIS — Z880 Allergy status to penicillin: Secondary | ICD-10-CM | POA: Diagnosis not present

## 2018-10-13 DIAGNOSIS — C189 Malignant neoplasm of colon, unspecified: Secondary | ICD-10-CM | POA: Diagnosis not present

## 2018-10-13 DIAGNOSIS — Z90710 Acquired absence of both cervix and uterus: Secondary | ICD-10-CM | POA: Diagnosis not present

## 2018-10-13 DIAGNOSIS — Z85048 Personal history of other malignant neoplasm of rectum, rectosigmoid junction, and anus: Secondary | ICD-10-CM | POA: Diagnosis not present

## 2018-10-13 DIAGNOSIS — Z882 Allergy status to sulfonamides status: Secondary | ICD-10-CM | POA: Diagnosis not present

## 2018-10-13 DIAGNOSIS — Z466 Encounter for fitting and adjustment of urinary device: Secondary | ICD-10-CM | POA: Diagnosis not present

## 2018-10-27 ENCOUNTER — Other Ambulatory Visit: Payer: BC Managed Care – PPO

## 2018-10-27 ENCOUNTER — Ambulatory Visit: Payer: BC Managed Care – PPO | Admitting: Hematology & Oncology

## 2018-10-27 ENCOUNTER — Ambulatory Visit: Payer: BC Managed Care – PPO

## 2018-10-28 DIAGNOSIS — Z933 Colostomy status: Secondary | ICD-10-CM | POA: Diagnosis not present

## 2018-10-28 DIAGNOSIS — Z85038 Personal history of other malignant neoplasm of large intestine: Secondary | ICD-10-CM | POA: Diagnosis not present

## 2018-11-17 ENCOUNTER — Inpatient Hospital Stay: Payer: BC Managed Care – PPO | Attending: Radiation Oncology | Admitting: Hematology & Oncology

## 2018-11-17 ENCOUNTER — Telehealth: Payer: Self-pay | Admitting: Hematology & Oncology

## 2018-11-17 ENCOUNTER — Other Ambulatory Visit: Payer: Self-pay

## 2018-11-17 ENCOUNTER — Inpatient Hospital Stay: Payer: BC Managed Care – PPO

## 2018-11-17 VITALS — BP 117/75 | HR 87 | Temp 97.1°F | Resp 18 | Wt 163.0 lb

## 2018-11-17 DIAGNOSIS — Z9221 Personal history of antineoplastic chemotherapy: Secondary | ICD-10-CM | POA: Diagnosis not present

## 2018-11-17 DIAGNOSIS — Z923 Personal history of irradiation: Secondary | ICD-10-CM | POA: Diagnosis not present

## 2018-11-17 DIAGNOSIS — Z791 Long term (current) use of non-steroidal anti-inflammatories (NSAID): Secondary | ICD-10-CM | POA: Diagnosis not present

## 2018-11-17 DIAGNOSIS — C189 Malignant neoplasm of colon, unspecified: Secondary | ICD-10-CM

## 2018-11-17 DIAGNOSIS — Z79899 Other long term (current) drug therapy: Secondary | ICD-10-CM | POA: Insufficient documentation

## 2018-11-17 DIAGNOSIS — Z95828 Presence of other vascular implants and grafts: Secondary | ICD-10-CM

## 2018-11-17 LAB — CMP (CANCER CENTER ONLY)
ALT: 8 U/L (ref 0–44)
AST: 12 U/L — ABNORMAL LOW (ref 15–41)
Albumin: 3.9 g/dL (ref 3.5–5.0)
Alkaline Phosphatase: 83 U/L (ref 38–126)
Anion gap: 9 (ref 5–15)
BUN: 15 mg/dL (ref 6–20)
CO2: 28 mmol/L (ref 22–32)
Calcium: 8.9 mg/dL (ref 8.9–10.3)
Chloride: 103 mmol/L (ref 98–111)
Creatinine: 0.73 mg/dL (ref 0.44–1.00)
GFR, Est AFR Am: >60 mL/min (ref >60)
GFR, Estimated: >60 mL/min (ref >60)
Glucose, Bld: 111 mg/dL — ABNORMAL HIGH (ref 70–99)
Potassium: 4.1 mmol/L (ref 3.5–5.1)
Sodium: 140 mmol/L (ref 135–145)
Total Bilirubin: 0.4 mg/dL (ref 0.3–1.2)
Total Protein: 7 g/dL (ref 6.5–8.1)

## 2018-11-17 LAB — CBC WITH DIFFERENTIAL (CANCER CENTER ONLY)
Abs Immature Granulocytes: 0.01 10*3/uL (ref 0.00–0.07)
Basophils Absolute: 0 10*3/uL (ref 0.0–0.1)
Basophils Relative: 1 %
Eosinophils Absolute: 0.4 10*3/uL (ref 0.0–0.5)
Eosinophils Relative: 6 %
HCT: 38.5 % (ref 36.0–46.0)
Hemoglobin: 12.5 g/dL (ref 12.0–15.0)
Immature Granulocytes: 0 %
Lymphocytes Relative: 19 %
Lymphs Abs: 1.1 10*3/uL (ref 0.7–4.0)
MCH: 30.5 pg (ref 26.0–34.0)
MCHC: 32.5 g/dL (ref 30.0–36.0)
MCV: 93.9 fL (ref 80.0–100.0)
Monocytes Absolute: 0.5 10*3/uL (ref 0.1–1.0)
Monocytes Relative: 8 %
Neutro Abs: 4.1 10*3/uL (ref 1.7–7.7)
Neutrophils Relative %: 66 %
Platelet Count: 308 10*3/uL (ref 150–400)
RBC: 4.1 MIL/uL (ref 3.87–5.11)
RDW: 12.7 % (ref 11.5–15.5)
WBC Count: 6.2 10*3/uL (ref 4.0–10.5)
nRBC: 0 % (ref 0.0–0.2)

## 2018-11-17 LAB — IRON AND TIBC
Iron: 72 ug/dL (ref 41–142)
Saturation Ratios: 29 % (ref 21–57)
TIBC: 250 ug/dL (ref 236–444)
UIBC: 178 ug/dL (ref 120–384)

## 2018-11-17 LAB — CEA (IN HOUSE-CHCC): CEA (CHCC-In House): 2.05 ng/mL (ref 0.00–5.00)

## 2018-11-17 LAB — FERRITIN: Ferritin: 484 ng/mL — ABNORMAL HIGH (ref 11–307)

## 2018-11-17 MED ORDER — SODIUM CHLORIDE 0.9% FLUSH
10.0000 mL | INTRAVENOUS | Status: DC | PRN
  Administered 2018-11-17: 08:00:00 10 mL via INTRAVENOUS
  Filled 2018-11-17: qty 10

## 2018-11-17 MED ORDER — HEPARIN SOD (PORK) LOCK FLUSH 100 UNIT/ML IV SOLN
500.0000 [IU] | Freq: Once | INTRAVENOUS | Status: AC
  Administered 2018-11-17: 500 [IU] via INTRAVENOUS
  Filled 2018-11-17: qty 5

## 2018-11-17 NOTE — Progress Notes (Signed)
Hematology and Oncology Follow Up Visit  Carla Mills [id number] 09/10/1970 48 y.o. 11/17/2018   Principle Diagnosis:  Recurrent colon cancer - HER2 (-)/KRAS mutant/MSI stable/MMR normal/BRAF (wt) -   Past Therapy: FOLFOXIRI - s/p cycle #12 -completed on 11/12/2016 Radiation therapy with Xeloda - completed on 06/06/2017 Status post exploratory laparotomy with HIPEC - 07/29/2016 FOLFOXIRI/Avastin - s/p cycle #8 (Avastin on hold)  Current Therapy:   Xeloda/Avastin - cycle #7 - started on 04/08/2018  ( Xeloda is 2500 po BID x 10 day) -- d/c on 09/29/2018 due to fistula   Interim History:  Carla Mills is here today for for follow-up.  She looks fantastic.  She underwent her surgery at [facility] on July 21.  Thankfully, from the operative report, there was NO cancer that was found.  Dr. Crisoforo Oxford did a fantastic job.  He resected part of the small bowel.  He took care of the fistula.  She does have a ureteral stent in.  She looks great.  She feels good.  She is eating well.  She is having no problems going to the bathroom.  Her colostomy is working nicely.  She has had no problems with a cough.  There is been no rashes.  She was allergic to the skin prep that they use which was alcohol based.  She has had no leg swelling.  She has had no fever.  I am so happy for her.  Now, she can finally enjoyed her life.  She can help her 3 daughters.  Her 25th anniversary is coming up in October.  She should be able to have a nice anniversary vacation.  Overall, I also performed status is ECOG 1.  Allergies as of 11/17/2018      Reactions   Bactrim [sulfamethoxazole-trimethoprim] Swelling, Other (See Comments)   Severe generalized swelling   Capecitabine Hives, Other (See Comments)   Legs and arms, after completing therapy   Oxaprozin Hives   Day pro   Penicillins Hives, Other (See Comments)   Has patient had a PCN reaction causing immediate rash, facial/tongue/throat swelling, SOB or  lightheadedness with hypotension: no Has patient had a PCN reaction causing severe rash involving mucus membranes or skin necrosis: {no Has patient had a PCN reaction that required hospitalization no Has patient had a PCN reaction occurring within the last 10 years: no If all of the above answers are "NO", then may proceed with Cephalosporin use.      Medication List       Accurate as of November 17, 2018  9:28 AM. If you have any questions, ask your nurse or doctor.        capecitabine 500 MG tablet Commonly known as: XELODA Take 5 tablets in the AM and 4 tablets in the PM, daily for 14 days What changed: additional instructions   fexofenadine-pseudoephedrine 60-120 MG 12 hr tablet Commonly known as: ALLEGRA-D Take 1 tablet by mouth 2 (two) times daily.   fluticasone 50 MCG/ACT nasal spray Commonly known as: FLONASE Place 1 spray into both nostrils 2 (two) times daily.   ibuprofen 600 MG tablet Commonly known as: ADVIL Take 600 mg by mouth every 6 (six) hours as needed.   LORazepam 1 MG tablet Commonly known as: ATIVAN Take 1 mg by mouth every 8 (eight) hours.   pramoxine-hydrocortisone 1-1 % rectal cream Commonly known as: PROCTOCREAM-HC Place 1 application rectally 2 (two) times daily.   traMADol 50 MG tablet Commonly known as: ULTRAM Take 1 tablet (50 mg total)  by mouth every 6 (six) hours as needed.       Allergies:  Allergies  Allergen Reactions  . Bactrim [Sulfamethoxazole-Trimethoprim] Swelling and Other (See Comments)    Severe generalized swelling  . Capecitabine Hives and Other (See Comments)    Legs and arms, after completing therapy  . Oxaprozin Hives    Day pro  . Penicillins Hives and Other (See Comments)    Has patient had a PCN reaction causing immediate rash, facial/tongue/throat swelling, SOB or lightheadedness with hypotension: no Has patient had a PCN reaction causing severe rash involving mucus membranes or skin necrosis: {no Has patient  had a PCN reaction that required hospitalization no Has patient had a PCN reaction occurring within the last 10 years: no If all of the above answers are "NO", then may proceed with Cephalosporin use.    Past Medical History, Surgical history, Social history, and Family History were reviewed and updated.  Review of Systems: Review of Systems  Constitutional: Negative.   HENT: Negative.   Eyes: Negative.   Respiratory: Negative.   Cardiovascular: Negative.   Gastrointestinal: Negative.   Genitourinary: Negative.   Musculoskeletal: Negative.   Skin: Negative.   Neurological: Negative.   Endo/Heme/Allergies: Negative.   Psychiatric/Behavioral: Negative.       Physical Exam:  weight is 163 lb (73.9 kg). Her temporal temperature is 97.1 F (36.2 C) (abnormal). Her blood pressure is 117/75 and her pulse is 87. Her respiration is 18 and oxygen saturation is 100%.   Wt Readings from Last 3 Encounters:  11/17/18 163 lb (73.9 kg)  09/29/18 165 lb (74.8 kg)  09/01/18 166 lb 12 oz (75.6 kg)    Her vital signs show a temperature 97.6.  Pulse 76.  Blood pressure 114/75.  Physical Exam Vitals signs reviewed.  HENT:     Head: Normocephalic and atraumatic.  Eyes:     Pupils: Pupils are equal, round, and reactive to light.  Neck:     Musculoskeletal: Normal range of motion.  Cardiovascular:     Rate and Rhythm: Normal rate and regular rhythm.     Heart sounds: Normal heart sounds.  Pulmonary:     Effort: Pulmonary effort is normal.     Breath sounds: Normal breath sounds.  Abdominal:     General: Bowel sounds are normal.     Palpations: Abdomen is soft.     Comments: Abdominal exam shows laparotomy scars that are well-healed.  She has a colostomy in the left lower quadrant.  She has no fluid wave.  There is no guarding or rebound tenderness.  She has no palpable liver or spleen tip.  Musculoskeletal: Normal range of motion.        General: No tenderness or deformity.   Lymphadenopathy:     Cervical: No cervical adenopathy.  Skin:    General: Skin is warm and dry.     Findings: No erythema or rash.  Neurological:     Mental Status: She is alert and oriented to person, place, and time.  Psychiatric:        Behavior: Behavior normal.        Thought Content: Thought content normal.        Judgment: Judgment normal.      Lab Results  Component Value Date   WBC 6.2 11/17/2018   HGB 12.5 11/17/2018   HCT 38.5 11/17/2018   MCV 93.9 11/17/2018   PLT 308 11/17/2018   Lab Results  Component Value Date   FERRITIN  784 (H) 06/16/2018   IRON 139 06/16/2018   TIBC 321 06/16/2018   UIBC 182 06/16/2018   IRONPCTSAT 43 06/16/2018   Lab Results  Component Value Date   RETICCTPCT 0.8 02/12/2015   RBC 4.10 11/17/2018   No results found for: KPAFRELGTCHN, LAMBDASER, KAPLAMBRATIO No results found for: IGGSERUM, IGA, IGMSERUM No results found for: Kathrynn Ducking, MSPIKE, SPEI   Chemistry      Component Value Date/Time   NA 140 11/17/2018 0800   NA 145 03/27/2017 1101   NA 141 07/30/2016 0750   K 4.1 11/17/2018 0800   K 4.5 03/27/2017 1101   K 4.2 07/30/2016 0750   CL 103 11/17/2018 0800   CL 103 03/27/2017 1101   CO2 28 11/17/2018 0800   CO2 28 03/27/2017 1101   CO2 26 07/30/2016 0750   BUN 15 11/17/2018 0800   BUN 12 03/27/2017 1101   BUN 12.1 07/30/2016 0750   CREATININE 0.73 11/17/2018 0800   CREATININE 0.9 03/27/2017 1101   CREATININE 0.7 07/30/2016 0750      Component Value Date/Time   CALCIUM 8.9 11/17/2018 0800   CALCIUM 9.6 03/27/2017 1101   CALCIUM 9.5 07/30/2016 0750   ALKPHOS 83 11/17/2018 0800   ALKPHOS 101 (H) 03/27/2017 1101   ALKPHOS 72 07/30/2016 0750   AST 12 (L) 11/17/2018 0800   AST 22 07/30/2016 0750   ALT 8 11/17/2018 0800   ALT 20 03/27/2017 1101   ALT 22 07/30/2016 0750   BILITOT 0.4 11/17/2018 0800   BILITOT 0.54 07/30/2016 0750       Impression and Plan: Ms.  Mills is a very pleasant 48 yo caucasian female with recurrent colon cancer - HER2 (-)/KRAS (+)/MSI stable/MMR normal/BRAF -.   Given the fact that no cancer was found, we can hold off on putting her through to treat therapy right now.  I realize that part of the problem that she had was probably from the Avastin that we used.  I am sure that her cancer will come back again.  If we can just keep her off therapy for the remainder of the year, I think that would be a wonderful opportunity.  Carla Mills will know when her cancer comes back.  She has always known in the past.  Typically, she starts having pain which she has not had before.  I would like to see her back in about 2 months.  She still has a Port-A-Cath in that we have to flush.  I spent about 30 minutes with her today.  I went over the operative report that she had from [facility].  I went over her lab work.  I am just happy that she will have a better quality of life now.        Volanda Napoleon, MD 8/25/20209:28 AM

## 2018-11-17 NOTE — Telephone Encounter (Signed)
Spoke with patient to confirm 10/13 appt at 0800 per 8/25 los

## 2018-11-17 NOTE — Patient Instructions (Signed)

## 2018-11-24 ENCOUNTER — Inpatient Hospital Stay: Payer: BC Managed Care – PPO

## 2018-11-24 ENCOUNTER — Inpatient Hospital Stay: Payer: BC Managed Care – PPO | Admitting: Hematology & Oncology

## 2018-12-15 ENCOUNTER — Encounter (HOSPITAL_BASED_OUTPATIENT_CLINIC_OR_DEPARTMENT_OTHER): Payer: Self-pay | Admitting: Adult Health

## 2018-12-15 ENCOUNTER — Emergency Department (HOSPITAL_BASED_OUTPATIENT_CLINIC_OR_DEPARTMENT_OTHER)
Admission: EM | Admit: 2018-12-15 | Discharge: 2018-12-15 | Disposition: A | Discharge status: Home / Self Care | Payer: BC Managed Care – PPO | Attending: Emergency Medicine | Admitting: Emergency Medicine

## 2018-12-15 ENCOUNTER — Other Ambulatory Visit: Payer: Self-pay

## 2018-12-15 DIAGNOSIS — Z933 Colostomy status: Secondary | ICD-10-CM | POA: Diagnosis not present

## 2018-12-15 DIAGNOSIS — Z79899 Other long term (current) drug therapy: Secondary | ICD-10-CM | POA: Insufficient documentation

## 2018-12-15 DIAGNOSIS — C772 Secondary and unspecified malignant neoplasm of intra-abdominal lymph nodes: Secondary | ICD-10-CM | POA: Insufficient documentation

## 2018-12-15 DIAGNOSIS — Z85038 Personal history of other malignant neoplasm of large intestine: Secondary | ICD-10-CM | POA: Diagnosis not present

## 2018-12-15 DIAGNOSIS — N3000 Acute cystitis without hematuria: Secondary | ICD-10-CM

## 2018-12-15 DIAGNOSIS — M5489 Other dorsalgia: Secondary | ICD-10-CM | POA: Diagnosis present

## 2018-12-15 LAB — CBC WITH DIFFERENTIAL/PLATELET
Abs Immature Granulocytes: 0.02 10*3/uL (ref 0.00–0.07)
Basophils Absolute: 0 10*3/uL (ref 0.0–0.1)
Basophils Relative: 0 %
Eosinophils Absolute: 0.2 10*3/uL (ref 0.0–0.5)
Eosinophils Relative: 2 %
HCT: 38.1 % (ref 36.0–46.0)
Hemoglobin: 12.2 g/dL (ref 12.0–15.0)
Immature Granulocytes: 0 %
Lymphocytes Relative: 11 %
Lymphs Abs: 1 10*3/uL (ref 0.7–4.0)
MCH: 29.5 pg (ref 26.0–34.0)
MCHC: 32 g/dL (ref 30.0–36.0)
MCV: 92.3 fL (ref 80.0–100.0)
Monocytes Absolute: 0.8 10*3/uL (ref 0.1–1.0)
Monocytes Relative: 8 %
Neutro Abs: 7.2 10*3/uL (ref 1.7–7.7)
Neutrophils Relative %: 79 %
Platelets: 288 10*3/uL (ref 150–400)
RBC: 4.13 MIL/uL (ref 3.87–5.11)
RDW: 13.2 % (ref 11.5–15.5)
WBC: 9.2 10*3/uL (ref 4.0–10.5)
nRBC: 0 % (ref 0.0–0.2)

## 2018-12-15 LAB — COMPREHENSIVE METABOLIC PANEL
ALT: 13 U/L (ref 0–44)
AST: 17 U/L (ref 15–41)
Albumin: 4 g/dL (ref 3.5–5.0)
Alkaline Phosphatase: 72 U/L (ref 38–126)
Anion gap: 13 (ref 5–15)
BUN: 17 mg/dL (ref 6–20)
CO2: 21 mmol/L — ABNORMAL LOW (ref 22–32)
Calcium: 9 mg/dL (ref 8.9–10.3)
Chloride: 102 mmol/L (ref 98–111)
Creatinine, Ser: 0.76 mg/dL (ref 0.44–1.00)
GFR calc Af Amer: >60 mL/min (ref >60)
GFR calc non Af Amer: >60 mL/min (ref >60)
Glucose, Bld: 99 mg/dL (ref 70–99)
Potassium: 3.8 mmol/L (ref 3.5–5.1)
Sodium: 136 mmol/L (ref 135–145)
Total Bilirubin: 0.6 mg/dL (ref 0.3–1.2)
Total Protein: 7.1 g/dL (ref 6.5–8.1)

## 2018-12-15 LAB — URINALYSIS, ROUTINE W REFLEX MICROSCOPIC
Bilirubin Urine: NEGATIVE
Glucose, UA: NEGATIVE mg/dL
Ketones, ur: NEGATIVE mg/dL
Nitrite: POSITIVE — AB
Protein, ur: 30 mg/dL — AB
Specific Gravity, Urine: 1.015 (ref 1.005–1.030)
pH: 6.5 (ref 5.0–8.0)

## 2018-12-15 LAB — URINALYSIS, MICROSCOPIC (REFLEX): WBC, UA: >50 WBC/hpf (ref 0–5)

## 2018-12-15 MED ORDER — SODIUM CHLORIDE 0.9 % IV SOLN
1.0000 g | Freq: Once | INTRAVENOUS | Status: AC
  Administered 2018-12-15: 1 g via INTRAVENOUS
  Filled 2018-12-15: qty 10

## 2018-12-15 MED ORDER — CEPHALEXIN 500 MG PO CAPS: 500.0000 mg | ORAL_CAPSULE | Freq: Four times a day (QID) | ORAL | 0 refills | Status: DC

## 2018-12-15 MED ORDER — HEPARIN SOD (PORK) LOCK FLUSH 100 UNIT/ML IV SOLN
500.0000 [IU] | Freq: Once | INTRAVENOUS | Status: AC
  Administered 2018-12-15: 500 [IU]
  Filled 2018-12-15: qty 5

## 2018-12-15 MED ORDER — SODIUM CHLORIDE 0.9 % IV SOLN
INTRAVENOUS | Status: DC | PRN
  Administered 2018-12-15: 500 mL via INTRAVENOUS

## 2018-12-15 NOTE — ED Provider Notes (Addendum)
[place] EMERGENCY DEPARTMENT Provider Note   CSN: [id number] Arrival date & time: 12/15/18  1750     History   Chief Complaint Chief Complaint  Patient presents with  . Back Pain    HPI Carla Mills is a 48 y.o. female.     Patient with a ureteral stent in the right ureter.  Patient's past medical history significant for metastatic colon cancer.  Patient recently had a stent replaced in June followed by [facility] urology.  Patient is followed by hematology oncology.  Colon cancer currently under control.  Patient with a complaint of sort of right flank right back pain for 2 days.  Associated with the some fever temp up to 100.  Patient currently not receiving any chemo.  Patient does have a port in her right upper chest area.  No vomiting.  May be some slight nausea.     Past Medical History:  Diagnosis Date  . 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 Colonic cancer ([place]) 03/09/2015   last chemo sept 2019  . History of chemotherapy    completed on , radiation x 25 tx feb and march 2019  . History of difficult venous access   . PONV (postoperative nausea and vomiting)   . Rectal mass 02/20/15   Rectal/sigmoid mass    Patient Active Problem List   Diagnosis Date Noted  . Palliative care by specialist   . Cancer associated pain   . Goals of care, counseling/discussion   . Acute blood loss anemia   . 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 History of colon cancer 03/03/2017  . Abdominal pain 03/03/2017  . Intractable nausea and vomiting 05/25/2016  . Anxiety state 05/25/2016  . Acute colitis 05/24/2016  . 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 Metastatic colon cancer in female [facility]) 03/09/2015  . Colonic diverticular abscess 02/10/2015  . Constipation 02/10/2015  . Anemia, iron deficiency 02/10/2015  .  Liver lesion 02/10/2015  . ACUTE PHARYNGITIS 12/23/2009  . VIRAL URI 12/23/2009    Past Surgical History:  Procedure Laterality Date  . ABDOMINAL HYSTERECTOMY  12/03/2017   at [place] uterus removed and tumor removed tumor lower part of colon and colostomy revision  . APPENDECTOMY  02/20/15   Abnormal appearing  . CESAREAN SECTION     x 2  . COLON RESECTION N/A 03/07/2017   Procedure: EXPLORATORY LAPAROTOMY AND CREATION OF A COLOSTOMY AND MUCOUS FISTULA;  Surgeon: Johnathan Hausen, MD;  Location: [facility];  Service: General;  Laterality: N/A;  . COLONOSCOPY N/A 10/09/2015   Procedure: COLONOSCOPY;  Surgeon: Leighton Ruff, MD;  Location: [facility];  Service: Endoscopy;  Laterality: N/A;  . COLOSTOMY  02/20/15   Sigmoid/rectal mass  . COLOSTOMY N/A 02/20/2015   Procedure: COLOSTOMY;  Surgeon: [name], MD;  Location: [place];  Service: General;  Laterality: N/A;  . COLOSTOMY REVERSAL N/A 11/15/2015   Procedure: COLOSTOMY REVERSAL;  Surgeon: [name], MD;  Location: [facility];  Service: General;  Laterality: N/A;  . CYSTOSCOPY W/ URETERAL STENT PLACEMENT Right 03/16/2018   Procedure: CYSTOSCOPY WITH RETROGRADE PYELOGRAM/URETERAL STENT PLACEMENT;  Surgeon: Franchot Gallo, MD;  Location[facility];  Service: Urology;  Laterality: Right;  30 MINS  . CYSTOSCOPY W/ URETERAL STENT PLACEMENT Right 08/24/2018   Procedure: CYSTOSCOPY WITH RETROGRADE PYELOGRAM/URETERAL STENT EXCHANGE;  Surgeon:  Franchot Gallo, MD;  Location: [facility];  Service: Urology;  Laterality: Right;  . CYSTOSCOPY W/ URETERAL STENT REMOVAL N/A 08/24/2018   Procedure: CYSTOSCOPY WITH STENT EXCHANGE;  Surgeon: Franchot Gallo, MD;  Location[facility];  Service: Urology;  Laterality: N/A;  . EXPLORATORY LAPAROTOMY  02/20/15   Sigmoid/rectal mass  . FLEXIBLE SIGMOIDOSCOPY N/A 02/15/2015   Procedure: FLEXIBLE SIGMOIDOSCOPY;  Surgeon: [name], MD;   Location: [facility];  Service: Endoscopy;  Laterality: N/A;  . FLEXIBLE SIGMOIDOSCOPY N/A 03/06/2017   Procedure: FLEXIBLE SIGMOIDOSCOPY;  Surgeon: Yetta Flock, MD;  Location: [facility];  Service: Gastroenterology;  Laterality: N/A;  . FLEXIBLE SIGMOIDOSCOPY N/A 08/22/2017   Procedure: FLEXIBLE SIGMOIDOSCOPY;  Surgeon: Johnathan Hausen, MD;  Location: [facility];  Service: General;  Laterality: N/A;  . HIPEC procedure  07/29/2016   done at [place]  04/08/2016   IR [place] RIGHT 04/08/2016 [name], MD [facility]  . IR GENERIC HISTORICAL  04/08/2016   IR US GUIDE VASC ACCESS RIGHT 04/08/2016 [name], MD [facility]  . LAPAROSCOPIC LYSIS OF ADHESIONS N/A 11/15/2015   Procedure: LAPAROSCOPIC LYSIS OF ADHESIONS;  Surgeon: [name], MD;  Location: [facility];  Service: General;  Laterality: N/A;  . LAPAROSCOPY N/A 02/20/2015   Procedure: LAPAROSCOPY DIAGNOSTIC;  Surgeon: [name], MD;  Location: [place];  Service: General;  Laterality: N/A;  . LAPAROSCOPY ABDOMEN DIAGNOSTIC  02/20/15   Converted to open - Sigmoid/rectal mass  . LAPAROTOMY N/A 02/20/2015   Procedure: EXPLORATORY LAPAROTOMY AND PARTIAL COLECTOMY;  Surgeon: [name], MD;  Location: [place];  Service: General;  Laterality: N/A;  . LOW ANTERIOR BOWEL RESECTION  02/20/15   Sigmoid/rectal mass  . OSTEOTOMY     reconstructed coccyx   . reconstructed nerve in ankle  Left    bone redone  . TONSILLECTOMY       OB History   No obstetric history on file.      Home Medications    Prior to Admission medications   Medication Sig Start Date End Date Taking? Authorizing Provider  capecitabine (XELODA) 500 MG tablet Take 5 tablets in the AM and 4 tablets in the PM, daily for 14 days Patient taking differently: Take 5 tablets in the AM and 5 tablets in the PM, daily for 10 days 04/08/18   Volanda Napoleon, MD  cephALEXin (KEFLEX) 500 MG  capsule Take 1 capsule (500 mg total) by mouth 4 (four) times daily. 12/15/18   Fredia Sorrow, MD  fexofenadine-pseudoephedrine (ALLEGRA-D) 60-120 MG 12 hr tablet Take 1 tablet by mouth 2 (two) times daily.    [provider]  fluticasone (FLONASE) 50 MCG/ACT nasal spray Place 1 spray into both nostrils 2 (two) times daily.     [provider]  ibuprofen (ADVIL) 600 MG tablet Take 600 mg by mouth every 6 (six) hours as needed.    [provider]  LORazepam (ATIVAN) 1 MG tablet Take 1 mg by mouth every 8 (eight) hours.    [provider]  pramoxine-hydrocortisone (PROCTOCREAM-HC) 1-1 % rectal cream Place 1 application rectally 2 (two) times daily. 09/01/18   Volanda Napoleon, MD  traMADol (ULTRAM) 50 MG tablet Take 1 tablet (50 mg total) by mouth every 6 (six) hours as needed. 08/12/17   [place], Holli Humbles, NP  prochlorperazine (COMPAZINE) 10 MG tablet Take 1 tablet (10 mg total) by mouth every 6 (six)  hours as needed (NAUSEA). 07/15/17 03/03/18  Volanda Napoleon, MD    Family History History reviewed. No pertinent family history.  Social History Social History   Tobacco Use  . Smoking status: Never Smoker  . Smokeless tobacco: Never Used  Substance Use Topics  . Alcohol use: Yes    Alcohol/week: 0.0 standard drinks    Comment: socially   . Drug use: No     Allergies   Bactrim [sulfamethoxazole-trimethoprim], Capecitabine, Oxaprozin, and Penicillins   Review of Systems Review of Systems  Constitutional: Positive for fever. Negative for chills.  HENT: Negative for congestion, rhinorrhea and sore throat.   Eyes: Negative for visual disturbance.  Respiratory: Negative for cough and shortness of breath.   Cardiovascular: Negative for chest pain and leg swelling.  Gastrointestinal: Negative for abdominal pain, diarrhea, nausea and vomiting.  Genitourinary: Positive for flank pain. Negative for dysuria.  Musculoskeletal: Negative for back pain and  neck pain.  Skin: Negative for rash.  Neurological: Negative for dizziness, light-headedness and headaches.  Hematological: Does not bruise/bleed easily.  Psychiatric/Behavioral: Negative for confusion.     Physical Exam Updated Vital Signs BP 125/89 (BP Location: Right Arm)   Pulse 84   Temp 98.9 F (37.2 C) (Oral)   Resp 18   Ht 1.651 m (5\' 5" )   Wt 72.6 kg   SpO2 100%   BMI 26.63 kg/m   Physical Exam Vitals signs and nursing note reviewed.  Constitutional:      General: She is not in acute distress.    Appearance: Normal appearance. She is well-developed. She is not ill-appearing or toxic-appearing.  HENT:     Head: Normocephalic and atraumatic.  Eyes:     Extraocular Movements: Extraocular movements intact.     Conjunctiva/sclera: Conjunctivae normal.     Pupils: Pupils are equal, round, and reactive to light.  Neck:     Musculoskeletal: Normal range of motion and neck supple.  Cardiovascular:     Rate and Rhythm: Normal rate and regular rhythm.     Heart sounds: No murmur.  Pulmonary:     Effort: Pulmonary effort is normal. No respiratory distress.     Breath sounds: Normal breath sounds.  Abdominal:     Palpations: Abdomen is soft.     Tenderness: There is no abdominal tenderness.     Comments: Colostomy  Musculoskeletal: Normal range of motion.  Skin:    General: Skin is warm and dry.  Neurological:     General: No focal deficit present.     Mental Status: She is alert and oriented to person, place, and time.      ED Treatments / Results  Labs (all labs ordered are listed, but only abnormal results are displayed) Labs Reviewed  URINALYSIS, ROUTINE W REFLEX MICROSCOPIC - Abnormal; Notable for the following components:      Result Value   APPearance CLOUDY (*)    Hgb urine dipstick MODERATE (*)    Protein, ur 30 (*)    Nitrite POSITIVE (*)    Leukocytes,Ua LARGE (*)    All other components within normal limits  URINALYSIS, MICROSCOPIC (REFLEX) -  Abnormal; Notable for the following components:   Bacteria, UA MANY (*)    All other components within normal limits  COMPREHENSIVE METABOLIC PANEL - Abnormal; Notable for the following components:   CO2 21 (*)    All other components within normal limits  URINE CULTURE  CBC WITH DIFFERENTIAL/PLATELET    EKG None  Radiology No  results found.  Procedures Procedures (including critical care time)  Medications Ordered in ED Medications  0.9 %  sodium chloride infusion (500 mLs Intravenous New Bag/Given 12/15/18 1951)  heparin lock flush 100 unit/mL (has no administration in time range)  cefTRIAXone (ROCEPHIN) 1 g in sodium chloride 0.9 % 100 mL IVPB (1 g Intravenous New Bag/Given 12/15/18 1953)     Initial Impression / Assessment and Plan / ED Course  I have reviewed the triage vital signs and the nursing notes.  Pertinent labs & imaging results that were available during my care of the patient were reviewed by me and considered in my medical decision making (see chart for details).        Despite having the stent.  Urine highly suggestive of urinary tract infection nitrite positive.  Sent for culture.  Due to the fevers and the right flank pain patient does not normally have pain related with the stent.  Went had gave her 1 g of Rocephin IV.  Check basic labs.  CBC and differential without any significant abnormalities.  No significant leukopenia.  Renal functions normal.  LFTs are normal as well.  Will discuss with urology.  Patient may be able to go home on Keflex urine culture pending and follow-up with urology.  We will see what they say in consultation.  In discussion with with urology Dr. Tammi Klippel.  He concurs with the game plan.  He is recommending routine office follow-up.  Final Clinical Impressions(s) / ED Diagnoses   Final diagnoses:  Acute cystitis without hematuria    ED Discharge Orders         Ordered    cephALEXin (KEFLEX) 500 MG capsule  4 times daily      12/15/18 2104           Fredia Sorrow, MD 12/15/18 2113    Fredia Sorrow, MD 12/15/18 2118

## 2018-12-15 NOTE — ED Notes (Addendum)
Spoke with lab to add on urine culture 

## 2018-12-15 NOTE — Discharge Instructions (Addendum)
Urine sent for culture.  Urinalysis here today despite having the stent in the right ureter highly suggestive of infection.  You were given a dose of Rocephin here IV.  Take the Keflex at home starting tomorrow as directed.  Give urology a call to let them know that you were seen in with a concern for infection.  Return for worse fevers worse pain vomiting.  Would expect improvement over the next couple days.

## 2018-12-15 NOTE — ED Triage Notes (Signed)
Carla Mills presents with right sided back pain that began 2 days ago. She has a ureter stent on the right side. She states ibuprofen is not touching the pain. She also reports feeling flushed and having a slight fever, she endorses nausea. The pain does not move or radiate. She has colon cancer but is not taking chemo at this time.

## 2018-12-15 NOTE — ED Notes (Signed)
NS at [facility] going thru port

## 2018-12-16 DIAGNOSIS — M9904 Segmental and somatic dysfunction of sacral region: Secondary | ICD-10-CM | POA: Diagnosis not present

## 2018-12-16 DIAGNOSIS — M9903 Segmental and somatic dysfunction of lumbar region: Secondary | ICD-10-CM | POA: Diagnosis not present

## 2018-12-16 DIAGNOSIS — M545 Low back pain: Secondary | ICD-10-CM | POA: Diagnosis not present

## 2018-12-16 DIAGNOSIS — M62830 Muscle spasm of back: Secondary | ICD-10-CM | POA: Diagnosis not present

## 2018-12-16 MED FILL — CEPHALEXIN 500 MG CAPSULE: 500 | 7 days supply | Qty: 28 | Fill #0

## 2018-12-17 LAB — URINE CULTURE: Culture: >=100000 — AB

## 2018-12-18 ENCOUNTER — Telehealth: Payer: Self-pay | Admitting: Emergency Medicine

## 2018-12-18 NOTE — Telephone Encounter (Signed)
Post ED Visit - Positive Culture Follow-up  Culture report reviewed by antimicrobial stewardship pharmacist: [place] Team []  Elenor Quinones, Pharm.D. []  Heide Guile, Pharm.D., BCPS AQ-ID []  Parks Neptune, Pharm.D., BCPS []  Alycia Rossetti, Pharm.D., BCPS []  [place], [place].D., BCPS, AAHIVP []  Legrand Como, Pharm.D., BCPS, AAHIVP []  Salome Arnt, PharmD, BCPS []  Johnnette Gourd, PharmD, BCPS [x]  Hughes Better, PharmD, BCPS []  Leeroy Cha, PharmD []  Laqueta Linden, PharmD, BCPS []  Albertina Parr, PharmD  [place] Team []  Leodis Sias, PharmD []  Lindell Spar, PharmD []  Royetta Asal, PharmD []  Graylin Shiver, Rph []  Rema Fendt) Glennon Mac, PharmD []  Arlyn Dunning, PharmD []  Netta Cedars, PharmD []  Dia Sitter, PharmD []  Leone Haven, PharmD []  Gretta Arab, PharmD []  Theodis Shove, PharmD []  Peggyann Juba, PharmD []  Reuel Boom, PharmD   Positive urine culture Treated with Cephalexin, organism sensitive to the same and no further patient follow-up is required at this time.  Sandi Raveling Carla Mills 12/18/2018, 10:40 AM

## 2018-12-23 DIAGNOSIS — M545 Low back pain: Secondary | ICD-10-CM | POA: Diagnosis not present

## 2018-12-23 DIAGNOSIS — M62830 Muscle spasm of back: Secondary | ICD-10-CM | POA: Diagnosis not present

## 2018-12-23 DIAGNOSIS — M9903 Segmental and somatic dysfunction of lumbar region: Secondary | ICD-10-CM | POA: Diagnosis not present

## 2018-12-23 DIAGNOSIS — M9904 Segmental and somatic dysfunction of sacral region: Secondary | ICD-10-CM | POA: Diagnosis not present

## 2019-01-04 DIAGNOSIS — N3000 Acute cystitis without hematuria: Secondary | ICD-10-CM | POA: Diagnosis not present

## 2019-01-04 DIAGNOSIS — R8271 Bacteriuria: Secondary | ICD-10-CM | POA: Diagnosis not present

## 2019-01-04 DIAGNOSIS — N131 Hydronephrosis with ureteral stricture, not elsewhere classified: Secondary | ICD-10-CM | POA: Diagnosis not present

## 2019-01-04 MED FILL — CIPROFLOXACIN HCL 500 MG TA: 500 | 7 days supply | Qty: 14 | Fill #0

## 2019-01-05 ENCOUNTER — Inpatient Hospital Stay: Payer: BC Managed Care – PPO | Attending: Radiation Oncology | Admitting: Hematology & Oncology

## 2019-01-05 ENCOUNTER — Encounter: Payer: Self-pay | Admitting: Hematology & Oncology

## 2019-01-05 ENCOUNTER — Inpatient Hospital Stay: Payer: BC Managed Care – PPO

## 2019-01-05 ENCOUNTER — Other Ambulatory Visit: Payer: Self-pay

## 2019-01-05 DIAGNOSIS — C20 Malignant neoplasm of rectum: Secondary | ICD-10-CM | POA: Diagnosis not present

## 2019-01-05 DIAGNOSIS — C189 Malignant neoplasm of colon, unspecified: Secondary | ICD-10-CM | POA: Insufficient documentation

## 2019-01-05 DIAGNOSIS — C772 Secondary and unspecified malignant neoplasm of intra-abdominal lymph nodes: Secondary | ICD-10-CM

## 2019-01-05 DIAGNOSIS — C187 Malignant neoplasm of sigmoid colon: Secondary | ICD-10-CM

## 2019-01-05 LAB — CBC WITH DIFFERENTIAL (CANCER CENTER ONLY)
Abs Immature Granulocytes: 0.07 10*3/uL (ref 0.00–0.07)
Basophils Absolute: 0.1 10*3/uL (ref 0.0–0.1)
Basophils Relative: 0 %
Eosinophils Absolute: 0 10*3/uL (ref 0.0–0.5)
Eosinophils Relative: 0 %
HCT: 35.6 % — ABNORMAL LOW (ref 36.0–46.0)
Hemoglobin: 11.7 g/dL — ABNORMAL LOW (ref 12.0–15.0)
Immature Granulocytes: 1 %
Lymphocytes Relative: 5 %
Lymphs Abs: 0.7 10*3/uL (ref 0.7–4.0)
MCH: 29.8 pg (ref 26.0–34.0)
MCHC: 32.9 g/dL (ref 30.0–36.0)
MCV: 90.8 fL (ref 80.0–100.0)
Monocytes Absolute: 0.9 10*3/uL (ref 0.1–1.0)
Monocytes Relative: 6 %
Neutro Abs: 12.5 10*3/uL — ABNORMAL HIGH (ref 1.7–7.7)
Neutrophils Relative %: 88 %
Platelet Count: 198 10*3/uL (ref 150–400)
RBC: 3.92 MIL/uL (ref 3.87–5.11)
RDW: 13.4 % (ref 11.5–15.5)
WBC Count: 14.3 10*3/uL — ABNORMAL HIGH (ref 4.0–10.5)
nRBC: 0 % (ref 0.0–0.2)

## 2019-01-05 LAB — CMP (CANCER CENTER ONLY)
ALT: 30 U/L (ref 0–44)
AST: 23 U/L (ref 15–41)
Albumin: 3.8 g/dL (ref 3.5–5.0)
Alkaline Phosphatase: 78 U/L (ref 38–126)
Anion gap: 8 (ref 5–15)
BUN: 14 mg/dL (ref 6–20)
CO2: 25 mmol/L (ref 22–32)
Calcium: 9.3 mg/dL (ref 8.9–10.3)
Chloride: 102 mmol/L (ref 98–111)
Creatinine: 1.03 mg/dL — ABNORMAL HIGH (ref 0.44–1.00)
GFR, Est AFR Am: >60 mL/min (ref >60)
GFR, Estimated: >60 mL/min (ref >60)
Glucose, Bld: 152 mg/dL — ABNORMAL HIGH (ref 70–99)
Potassium: 3.5 mmol/L (ref 3.5–5.1)
Sodium: 135 mmol/L (ref 135–145)
Total Bilirubin: 0.7 mg/dL (ref 0.3–1.2)
Total Protein: 6.9 g/dL (ref 6.5–8.1)

## 2019-01-05 LAB — CEA (IN HOUSE-CHCC): CEA (CHCC-In House): 2.04 ng/mL (ref 0.00–5.00)

## 2019-01-05 MED ORDER — HEPARIN SOD (PORK) LOCK FLUSH 100 UNIT/ML IV SOLN
500.0000 [IU] | Freq: Once | INTRAVENOUS | Status: AC
  Administered 2019-01-05: 500 [IU] via INTRAVENOUS
  Filled 2019-01-05: qty 5

## 2019-01-05 MED ORDER — SODIUM CHLORIDE 0.9% FLUSH
10.0000 mL | INTRAVENOUS | Status: DC | PRN
  Administered 2019-01-05: 10 mL via INTRAVENOUS
  Filled 2019-01-05: qty 10

## 2019-01-05 NOTE — Patient Instructions (Signed)

## 2019-01-05 NOTE — Progress Notes (Signed)
Hematology and Oncology Follow Up Visit  Carla Mills [id number] 08-04-1970 48 y.o. 01/05/2019   Principle Diagnosis:  Recurrent colon cancer - HER2 (-)/KRAS mutant/MSI stable/MMR normal/BRAF (wt) -   Past Therapy: FOLFOXIRI - s/p cycle #12 -completed on 11/12/2016 Radiation therapy with Xeloda - completed on 06/06/2017 Status post exploratory laparotomy with HIPEC - 07/29/2016 FOLFOXIRI/Avastin - s/p cycle #8 (Avastin on hold)  Current Therapy:   Xeloda/Avastin - cycle #7 - started on 04/08/2018  ( Xeloda is 2500 po BID x 10 day) -- d/c on 09/29/2018 due to fistula   Interim History:  Carla Mills is here today for for follow-up.  She looks fantastic.  She really is doing quite well.  Next week will be her 58th wedding anniversary.  She is looking forward to this.  She said that over the weekend, she had episode of bleeding.  This is bleeding per rectum and bleeding through the vagina.  This was just temporary.  There is no pain associated with it.  There is no fever associated with this.  She is been having problems with her ureteral stents.  She has had a couple urinary tract infections.  She may need to have the stents repositioned.  She sees urology for this.  She has had no problems with nausea or vomiting.  She has had no cough.  There is been no rashes.  There has been no leg swelling.  Overall, I also performed status is ECOG 1.  Allergies as of 01/05/2019      Reactions   Bactrim [sulfamethoxazole-trimethoprim] Swelling, Other (See Comments)   Severe generalized swelling   Capecitabine Hives, Other (See Comments)   Legs and arms, after completing therapy   Chlorhexidine Rash   Oxaprozin Hives   Day pro   Penicillins Hives, Other (See Comments)   Has patient had a PCN reaction causing immediate rash, facial/tongue/throat swelling, SOB or lightheadedness with hypotension: no Has patient had a PCN reaction causing severe rash involving mucus membranes  or skin necrosis: {no Has patient had a PCN reaction that required hospitalization no Has patient had a PCN reaction occurring within the last 10 years: no If all of the above answers are "NO", then may proceed with Cephalosporin use.      Medication List       Accurate as of January 05, 2019  8:39 AM. If you have any questions, ask your nurse or doctor.        STOP taking these medications   capecitabine 500 MG tablet Commonly known as: XELODA Stopped by: Volanda Napoleon, MD   cephALEXin 500 MG capsule Commonly known as: KEFLEX Stopped by: Volanda Napoleon, MD   fexofenadine-pseudoephedrine 60-120 MG 12 hr tablet Commonly known as: ALLEGRA-D Stopped by: Volanda Napoleon, MD   pramoxine-hydrocortisone 1-1 % rectal cream Commonly known as: PROCTOCREAM-HC Stopped by: Volanda Napoleon, MD     TAKE these medications   fluticasone 50 MCG/ACT nasal spray Commonly known as: FLONASE Place 1 spray into both nostrils 2 (two) times daily.   ibuprofen 600 MG tablet Commonly known as: ADVIL Take 600 mg by mouth every 6 (six) hours as needed.   LORazepam 1 MG tablet Commonly known as: ATIVAN Take 1 mg by mouth every 8 (eight) hours.   traMADol 50 MG tablet Commonly known as: ULTRAM Take 1 tablet (50 mg total) by mouth every 6 (six) hours as needed.       Allergies:  Allergies  Allergen Reactions  . Bactrim [  Sulfamethoxazole-Trimethoprim] Swelling and Other (See Comments)    Severe generalized swelling  . Capecitabine Hives and Other (See Comments)    Legs and arms, after completing therapy  . Chlorhexidine Rash  . Oxaprozin Hives    Day pro  . Penicillins Hives and Other (See Comments)    Has patient had a PCN reaction causing immediate rash, facial/tongue/throat swelling, SOB or lightheadedness with hypotension: no Has patient had a PCN reaction causing severe rash involving mucus membranes or skin necrosis: {no Has patient had a PCN reaction that required  hospitalization no Has patient had a PCN reaction occurring within the last 10 years: no If all of the above answers are "NO", then may proceed with Cephalosporin use.    Past Medical History, Surgical history, Social history, and Family History were reviewed and updated.  Review of Systems: Review of Systems  Constitutional: Negative.   HENT: Negative.   Eyes: Negative.   Respiratory: Negative.   Cardiovascular: Negative.   Gastrointestinal: Negative.   Genitourinary: Negative.   Musculoskeletal: Negative.   Skin: Negative.   Neurological: Negative.   Endo/Heme/Allergies: Negative.   Psychiatric/Behavioral: Negative.       Physical Exam:  height is _0  (1.651 m) and weight is 162 lb 1.9 oz (73.5 kg). Her temporal temperature is 97.1 F (36.2 C) (abnormal). Her blood pressure is 100/70 and her pulse is 113 (abnormal). Her oxygen saturation is 98%.   Wt Readings from Last 3 Encounters:  01/05/19 162 lb 1.9 oz (73.5 kg)  12/15/18 160 lb (72.6 kg)  11/17/18 163 lb (73.9 kg)    Her vital signs show a temperature 97.6.  Pulse 76.  Blood pressure 114/75.  Physical Exam Vitals signs reviewed.  HENT:     Head: Normocephalic and atraumatic.  Eyes:     Pupils: Pupils are equal, round, and reactive to light.  Neck:     Musculoskeletal: Normal range of motion.  Cardiovascular:     Rate and Rhythm: Normal rate and regular rhythm.     Heart sounds: Normal heart sounds.  Pulmonary:     Effort: Pulmonary effort is normal.     Breath sounds: Normal breath sounds.  Abdominal:     General: Bowel sounds are normal.     Palpations: Abdomen is soft.     Comments: Abdominal exam shows laparotomy scars that are well-healed.  She has a colostomy in the left lower quadrant.  She has no fluid wave.  There is no guarding or rebound tenderness.  She has no palpable liver or spleen tip.  Musculoskeletal: Normal range of motion.        General: No tenderness or deformity.  Lymphadenopathy:      Cervical: No cervical adenopathy.  Skin:    General: Skin is warm and dry.     Findings: No erythema or rash.  Neurological:     Mental Status: She is alert and oriented to person, place, and time.  Psychiatric:        Behavior: Behavior normal.        Thought Content: Thought content normal.        Judgment: Judgment normal.      Lab Results  Component Value Date   WBC 14.3 (H) 01/05/2019   HGB 11.7 (L) 01/05/2019   HCT 35.6 (L) 01/05/2019   MCV 90.8 01/05/2019   PLT 198 01/05/2019   Lab Results  Component Value Date   FERRITIN 484 (H) 11/17/2018   IRON 72 11/17/2018  TIBC 250 11/17/2018   UIBC 178 11/17/2018   IRONPCTSAT 29 11/17/2018   Lab Results  Component Value Date   RETICCTPCT 0.8 02/12/2015   RBC 3.92 01/05/2019   No results found for: KPAFRELGTCHN, LAMBDASER, KAPLAMBRATIO No results found for: IGGSERUM, IGA, IGMSERUM No results found for: Kathrynn Ducking, MSPIKE, SPEI   Chemistry      Component Value Date/Time   NA 136 12/15/2018 1927   NA 145 03/27/2017 1101   NA 141 07/30/2016 0750   K 3.8 12/15/2018 1927   K 4.5 03/27/2017 1101   K 4.2 07/30/2016 0750   CL 102 12/15/2018 1927   CL 103 03/27/2017 1101   CO2 21 (L) 12/15/2018 1927   CO2 28 03/27/2017 1101   CO2 26 07/30/2016 0750   BUN 17 12/15/2018 1927   BUN 12 03/27/2017 1101   BUN 12.1 07/30/2016 0750   CREATININE 0.76 12/15/2018 1927   CREATININE 0.73 11/17/2018 0800   CREATININE 0.9 03/27/2017 1101   CREATININE 0.7 07/30/2016 0750      Component Value Date/Time   CALCIUM 9.0 12/15/2018 1927   CALCIUM 9.6 03/27/2017 1101   CALCIUM 9.5 07/30/2016 0750   ALKPHOS 72 12/15/2018 1927   ALKPHOS 101 (H) 03/27/2017 1101   ALKPHOS 72 07/30/2016 0750   AST 17 12/15/2018 1927   AST 12 (L) 11/17/2018 0800   AST 22 07/30/2016 0750   ALT 13 12/15/2018 1927   ALT 8 11/17/2018 0800   ALT 20 03/27/2017 1101   ALT 22 07/30/2016 0750   BILITOT 0.6  12/15/2018 1927   BILITOT 0.4 11/17/2018 0800   BILITOT 0.54 07/30/2016 0750       Impression and Plan: Ms. Hornak is a very pleasant 48 yo caucasian female with recurrent colon cancer - HER2 (-)/KRAS (+)/MSI stable/MMR normal/BRAF -.   Given the fact that no cancer was found, we can hold off on putting her through to treat therapy right now.  I not sure what this episode of bleeding was.  I worry about the development of a fistula.  I know that she had been on Avastin in the past.  I know that the surgery that she had back in July was partly because of Avastin.  I will set her up with scans when we see her back.  We will do this in December.  She still has her Port-A-Cath in.  She was knows that she can call us if she has any problems.          Volanda Napoleon, MD 10/13/20208:39 AM

## 2019-01-06 ENCOUNTER — Other Ambulatory Visit: Payer: Self-pay | Admitting: Urology

## 2019-01-06 DIAGNOSIS — M9903 Segmental and somatic dysfunction of lumbar region: Secondary | ICD-10-CM | POA: Diagnosis not present

## 2019-01-06 DIAGNOSIS — M62830 Muscle spasm of back: Secondary | ICD-10-CM | POA: Diagnosis not present

## 2019-01-06 DIAGNOSIS — M545 Low back pain: Secondary | ICD-10-CM | POA: Diagnosis not present

## 2019-01-06 DIAGNOSIS — M9904 Segmental and somatic dysfunction of sacral region: Secondary | ICD-10-CM | POA: Diagnosis not present

## 2019-01-06 MED FILL — CEPHALEXIN 500 MG CAPSULE: 500 | 30 days supply | Qty: 30 | Fill #0

## 2019-01-07 ENCOUNTER — Other Ambulatory Visit (HOSPITAL_BASED_OUTPATIENT_CLINIC_OR_DEPARTMENT_OTHER): Payer: BC Managed Care – PPO

## 2019-01-11 ENCOUNTER — Inpatient Hospital Stay (HOSPITAL_COMMUNITY): Payer: BC Managed Care – PPO | Admitting: Certified Registered Nurse Anesthetist

## 2019-01-11 ENCOUNTER — Other Ambulatory Visit: Payer: Self-pay | Admitting: Urology

## 2019-01-11 ENCOUNTER — Other Ambulatory Visit (HOSPITAL_COMMUNITY)
Admission: RE | Admit: 2019-01-11 | Discharge: 2019-01-11 | Disposition: A | Discharge status: Home / Self Care | Payer: BC Managed Care – PPO | Source: Ambulatory Visit | Attending: Urology | Admitting: Urology

## 2019-01-11 ENCOUNTER — Encounter (HOSPITAL_COMMUNITY): Payer: Self-pay

## 2019-01-11 ENCOUNTER — Encounter (HOSPITAL_COMMUNITY)
Admission: RE | Disposition: A | Discharge status: Home / Self Care | Payer: Self-pay | Source: Ambulatory Visit | Attending: Urology

## 2019-01-11 ENCOUNTER — Ambulatory Visit (HOSPITAL_COMMUNITY)
Admission: RE | Admit: 2019-01-11 | Discharge: 2019-01-11 | Disposition: A | Discharge status: Home / Self Care | Payer: BC Managed Care – PPO | Source: Ambulatory Visit | Attending: Urology | Admitting: Urology

## 2019-01-11 ENCOUNTER — Other Ambulatory Visit: Payer: Self-pay

## 2019-01-11 ENCOUNTER — Inpatient Hospital Stay (HOSPITAL_COMMUNITY): Payer: BC Managed Care – PPO

## 2019-01-11 DIAGNOSIS — Y831 Surgical operation with implant of artificial internal device as the cause of abnormal reaction of the patient, or of later complication, without mention of misadventure at the time of the procedure: Secondary | ICD-10-CM | POA: Diagnosis not present

## 2019-01-11 DIAGNOSIS — Z20828 Contact with and (suspected) exposure to other viral communicable diseases: Secondary | ICD-10-CM | POA: Diagnosis not present

## 2019-01-11 DIAGNOSIS — Z85038 Personal history of other malignant neoplasm of large intestine: Secondary | ICD-10-CM | POA: Insufficient documentation

## 2019-01-11 DIAGNOSIS — F419 Anxiety disorder, unspecified: Secondary | ICD-10-CM | POA: Insufficient documentation

## 2019-01-11 DIAGNOSIS — N1330 Unspecified hydronephrosis: Secondary | ICD-10-CM | POA: Diagnosis not present

## 2019-01-11 DIAGNOSIS — C772 Secondary and unspecified malignant neoplasm of intra-abdominal lymph nodes: Secondary | ICD-10-CM | POA: Insufficient documentation

## 2019-01-11 DIAGNOSIS — C189 Malignant neoplasm of colon, unspecified: Secondary | ICD-10-CM | POA: Diagnosis not present

## 2019-01-11 DIAGNOSIS — N131 Hydronephrosis with ureteral stricture, not elsewhere classified: Secondary | ICD-10-CM | POA: Diagnosis not present

## 2019-01-11 DIAGNOSIS — T83123A Displacement of other urinary stents, initial encounter: Secondary | ICD-10-CM | POA: Diagnosis not present

## 2019-01-11 DIAGNOSIS — Z79899 Other long term (current) drug therapy: Secondary | ICD-10-CM | POA: Diagnosis not present

## 2019-01-11 DIAGNOSIS — Z880 Allergy status to penicillin: Secondary | ICD-10-CM | POA: Insufficient documentation

## 2019-01-11 DIAGNOSIS — Z881 Allergy status to other antibiotic agents status: Secondary | ICD-10-CM | POA: Insufficient documentation

## 2019-01-11 DIAGNOSIS — Z9221 Personal history of antineoplastic chemotherapy: Secondary | ICD-10-CM | POA: Insufficient documentation

## 2019-01-11 DIAGNOSIS — J029 Acute pharyngitis, unspecified: Secondary | ICD-10-CM | POA: Diagnosis not present

## 2019-01-11 HISTORY — PX: CYSTOSCOPY WITH RETROGRADE PYELOGRAM, URETEROSCOPY AND STENT PLACEMENT: SHX5789

## 2019-01-11 LAB — SARS CORONAVIRUS 2 BY RT PCR (HOSPITAL ORDER, PERFORMED IN ~~LOC~~ HOSPITAL LAB): SARS Coronavirus 2: NEGATIVE

## 2019-01-11 SURGERY — CYSTOURETEROSCOPY, WITH RETROGRADE PYELOGRAM AND STENT INSERTION
Anesthesia: General | Laterality: Right

## 2019-01-11 MED ORDER — FENTANYL CITRATE (PF) 100 MCG/2ML IJ SOLN
INTRAMUSCULAR | Status: AC
  Filled 2019-01-11: qty 2

## 2019-01-11 MED ORDER — HYDROMORPHONE HCL 1 MG/ML IJ SOLN: 0.2500 mg | INTRAMUSCULAR | Status: DC | PRN

## 2019-01-11 MED ORDER — PROPOFOL 10 MG/ML IV BOLUS
INTRAVENOUS | Status: AC
  Filled 2019-01-11: qty 20

## 2019-01-11 MED ORDER — LIDOCAINE 2% (20 MG/ML) 5 ML SYRINGE
INTRAMUSCULAR | Status: DC | PRN
  Administered 2019-01-11: 70 mg via INTRAVENOUS

## 2019-01-11 MED ORDER — PROPOFOL 10 MG/ML IV BOLUS
INTRAVENOUS | Status: DC | PRN
  Administered 2019-01-11: 200 mg via INTRAVENOUS

## 2019-01-11 MED ORDER — MIDAZOLAM HCL 2 MG/2ML IJ SOLN
INTRAMUSCULAR | Status: AC
  Filled 2019-01-11: qty 2

## 2019-01-11 MED ORDER — FENTANYL CITRATE (PF) 100 MCG/2ML IJ SOLN
INTRAMUSCULAR | Status: DC | PRN
  Administered 2019-01-11: 50 ug via INTRAVENOUS

## 2019-01-11 MED ORDER — PHENYLEPHRINE 40 MCG/ML (10ML) SYRINGE FOR IV PUSH (FOR BLOOD PRESSURE SUPPORT)
PREFILLED_SYRINGE | INTRAVENOUS | Status: AC
  Filled 2019-01-11: qty 10

## 2019-01-11 MED ORDER — OXYCODONE HCL 5 MG/5ML PO SOLN: 5.0000 mg | Freq: Once | ORAL | Status: DC | PRN

## 2019-01-11 MED ORDER — LACTATED RINGERS IV SOLN
INTRAVENOUS | Status: DC
  Administered 2019-01-11: 15:00:00 via INTRAVENOUS

## 2019-01-11 MED ORDER — LIDOCAINE 2% (20 MG/ML) 5 ML SYRINGE
INTRAMUSCULAR | Status: AC
  Filled 2019-01-11: qty 5

## 2019-01-11 MED ORDER — ONDANSETRON HCL 4 MG/2ML IJ SOLN
INTRAMUSCULAR | Status: AC
  Filled 2019-01-11: qty 2

## 2019-01-11 MED ORDER — PHENYLEPHRINE 40 MCG/ML (10ML) SYRINGE FOR IV PUSH (FOR BLOOD PRESSURE SUPPORT)
PREFILLED_SYRINGE | INTRAVENOUS | Status: DC | PRN
  Administered 2019-01-11 (×2): 80 ug via INTRAVENOUS

## 2019-01-11 MED ORDER — MIDAZOLAM HCL 5 MG/5ML IJ SOLN
INTRAMUSCULAR | Status: DC | PRN
  Administered 2019-01-11: 2 mg via INTRAVENOUS

## 2019-01-11 MED ORDER — IOHEXOL 300 MG/ML  SOLN
INTRAMUSCULAR | Status: DC | PRN
  Administered 2019-01-11: 10 mL

## 2019-01-11 MED ORDER — SODIUM CHLORIDE 0.9 % IR SOLN
Status: DC | PRN
  Administered 2019-01-11: 3000 mL

## 2019-01-11 MED ORDER — PROMETHAZINE HCL 25 MG/ML IJ SOLN: 6.2500 mg | INTRAMUSCULAR | Status: DC | PRN

## 2019-01-11 MED ORDER — DEXAMETHASONE SODIUM PHOSPHATE 10 MG/ML IJ SOLN
INTRAMUSCULAR | Status: DC | PRN
  Administered 2019-01-11: 5 mg via INTRAVENOUS

## 2019-01-11 MED ORDER — DEXAMETHASONE SODIUM PHOSPHATE 10 MG/ML IJ SOLN
INTRAMUSCULAR | Status: AC
  Filled 2019-01-11: qty 1

## 2019-01-11 MED ORDER — OXYCODONE HCL 5 MG PO TABS: 5.0000 mg | ORAL_TABLET | Freq: Once | ORAL | Status: DC | PRN

## 2019-01-11 MED ORDER — ONDANSETRON HCL 4 MG/2ML IJ SOLN
INTRAMUSCULAR | Status: DC | PRN
  Administered 2019-01-11: 4 mg via INTRAVENOUS

## 2019-01-11 MED ORDER — DOXYCYCLINE HYCLATE 100 MG PO TABS: 100.0000 mg | ORAL_TABLET | Freq: Two times a day (BID) | ORAL | 0 refills | Status: DC

## 2019-01-11 MED ORDER — CIPROFLOXACIN IN D5W 400 MG/200ML IV SOLN
400.0000 mg | INTRAVENOUS | Status: AC
  Administered 2019-01-11: 400 mg via INTRAVENOUS
  Filled 2019-01-11: qty 200

## 2019-01-11 SURGICAL SUPPLY — 19 items
BAG URO CATCHER STRL LF (MISCELLANEOUS) ×2 IMPLANT
BASKET LASER NITINOL 1.9FR (BASKET) IMPLANT
BASKET ZERO TIP NITINOL 2.4FR (BASKET) IMPLANT
CATH INTERMIT  6FR 70CM (CATHETERS) ×2 IMPLANT
CLOTH BEACON ORANGE TIMEOUT ST (SAFETY) ×2 IMPLANT
FIBER LASER FLEXIVA 365 (UROLOGICAL SUPPLIES) IMPLANT
FIBER LASER TRAC TIP (UROLOGICAL SUPPLIES) IMPLANT
GLOVE BIOGEL M 8.0 STRL (GLOVE) ×2 IMPLANT
GOWN STRL REUS W/TWL XL LVL3 (GOWN DISPOSABLE) ×2 IMPLANT
GUIDEWIRE ANG ZIPWIRE 038X150 (WIRE) IMPLANT
GUIDEWIRE STR DUAL SENSOR (WIRE) ×2 IMPLANT
IV NS 1000ML (IV SOLUTION) ×1
IV NS 1000ML BAXH (IV SOLUTION) ×1 IMPLANT
KIT TURNOVER KIT A (KITS) IMPLANT
MANIFOLD NEPTUNE II (INSTRUMENTS) ×2 IMPLANT
PACK CYSTO (CUSTOM PROCEDURE TRAY) ×2 IMPLANT
STENT URET 6FRX24 CONTOUR (STENTS) ×2 IMPLANT
TUBING CONNECTING 10 (TUBING) ×2 IMPLANT
TUBING UROLOGY SET (TUBING) ×2 IMPLANT

## 2019-01-11 NOTE — Discharge Instructions (Signed)
1. You may see some blood in the urine and may have some burning with urination for 48-72 hours. You also may notice that you have to urinate more frequently or urgently after your procedure which is normal.  °2. You should call should you develop an inability urinate, fever > 101, persistent nausea and vomiting that prevents you from eating or drinking to stay hydrated.  °3. If you have a stent, you will likely urinate more frequently and urgently until the stent is removed and you may experience some discomfort/pain in the lower abdomen and flank especially when urinating. You may take pain medication prescribed to you if needed for pain. You may also intermittently have blood in the urine until the stent is removed. ° ° °General Anesthesia, Adult, Care After °This sheet gives you information about how to care for yourself after your procedure. Your health care provider may also give you more specific instructions. If you have problems or questions, contact your health care provider. °What can I expect after the procedure? °After the procedure, the following side effects are common: °· Pain or discomfort at the IV site. °· Nausea. °· Vomiting. °· Sore throat. °· Trouble concentrating. °· Feeling cold or chills. °· Weak or tired. °· Sleepiness and fatigue. °· Soreness and body aches. These side effects can affect parts of the body that were not involved in surgery. °Follow these instructions at home: ° °For at least 24 hours after the procedure: °· Have a responsible adult stay with you. It is important to have someone help care for you until you are awake and alert. °· Rest as needed. °· Do not: °? Participate in activities in which you could fall or become injured. °? Drive. °? Use heavy machinery. °? Drink alcohol. °? Take sleeping pills or medicines that cause drowsiness. °? Make important decisions or sign legal documents. °? Take care of children on your own. °Eating and drinking °· Follow any instructions  from your health care provider about eating or drinking restrictions. °· When you feel hungry, start by eating small amounts of foods that are soft and easy to digest (bland), such as toast. Gradually return to your regular diet. °· Drink enough fluid to keep your urine pale yellow. °· If you vomit, rehydrate by drinking water, juice, or clear broth. °General instructions °· If you have sleep apnea, surgery and certain medicines can increase your risk for breathing problems. Follow instructions from your health care provider about wearing your sleep device: °? Anytime you are sleeping, including during daytime naps. °? While taking prescription pain medicines, sleeping medicines, or medicines that make you drowsy. °· Return to your normal activities as told by your health care provider. Ask your health care provider what activities are safe for you. °· Take over-the-counter and prescription medicines only as told by your health care provider. °· If you smoke, do not smoke without supervision. °· Keep all follow-up visits as told by your health care provider. This is important. °Contact a health care provider if: °· You have nausea or vomiting that does not get better with medicine. °· You cannot eat or drink without vomiting. °· You have pain that does not get better with medicine. °· You are unable to pass urine. °· You develop a skin rash. °· You have a fever. °· You have redness around your IV site that gets worse. °Get help right away if: °· You have difficulty breathing. °· You have chest pain. °· You have blood in   your urine or stool, or you vomit blood. °Summary °· After the procedure, it is common to have a sore throat or nausea. It is also common to feel tired. °· Have a responsible adult stay with you for the first 24 hours after general anesthesia. It is important to have someone help care for you until you are awake and alert. °· When you feel hungry, start by eating small amounts of foods that are soft  and easy to digest (bland), such as toast. Gradually return to your regular diet. °· Drink enough fluid to keep your urine pale yellow. °· Return to your normal activities as told by your health care provider. Ask your health care provider what activities are safe for you. °This information is not intended to replace advice given to you by your health care provider. Make sure you discuss any questions you have with your health care provider. °Document Released: 06/17/2000 Document Revised: 03/14/2017 Document Reviewed: 10/25/2016 °Elsevier Patient Education © 2020 [employer] ° °

## 2019-01-11 NOTE — Anesthesia Preprocedure Evaluation (Addendum)
Anesthesia Evaluation  Patient identified by MRN, date of birth, ID band Patient awake    Reviewed: Allergy & Precautions, NPO status , Patient's Chart, lab work & pertinent test results  History of Anesthesia Complications (+) PONV and history of anesthetic complications  Airway Mallampati: I  TM Distance: >3 FB Neck ROM: Full    Dental no notable dental hx.    Pulmonary neg pulmonary ROS,    Pulmonary exam normal breath sounds clear to auscultation       Cardiovascular negative cardio ROS Normal cardiovascular exam Rhythm:Regular Rate:Normal     Neuro/Psych Anxiety  Neuromuscular disease    GI/Hepatic Neg liver ROS, Colon CA s/p chemo and radiation   Endo/Other  negative endocrine ROS  Renal/GU negative Renal ROS     Musculoskeletal negative musculoskeletal ROS (+)   Abdominal   Peds  Hematology  (+) anemia ,   Anesthesia Other Findings right hydronephritis  Reproductive/Obstetrics S/p hysterectomy                             Anesthesia Physical Anesthesia Plan  ASA: II  Anesthesia Plan: General   Post-op Pain Management:    Induction: Intravenous  PONV Risk Score and Plan: 4 or greater and Midazolam, Dexamethasone, Ondansetron and Treatment may vary due to age or medical condition  Airway Management Planned: LMA  Additional Equipment:   Intra-op Plan:   Post-operative Plan: Extubation in OR  Informed Consent: I have reviewed the patients History and Physical, chart, labs and discussed the procedure including the risks, benefits and alternatives for the proposed anesthesia with the patient or authorized representative who has indicated his/her understanding and acceptance.     Dental advisory given  Plan Discussed with: CRNA  Anesthesia Plan Comments:         Anesthesia Quick Evaluation

## 2019-01-11 NOTE — Interval H&P Note (Signed)
History and Physical Interval Note:  01/11/2019 5:29 PM  Carla Mills  has presented today for surgery, with the diagnosis of right hydronephritis.  The various methods of treatment have been discussed with the patient and family. After consideration of risks, benefits and other options for treatment, the patient has consented to  Procedure(s): CYSTOSCOPY STENT REMOVAL, [place], URETEROSCOPY AND STENT PLACEMENT (Right) as a surgical intervention.  [name] history has been reviewed, patient examined, no change in status, stable for surgery.  I have reviewed the patient's chart and labs.  Questions were answered to the patient's satisfaction.     Lillette Boxer Andras Grunewald

## 2019-01-11 NOTE — Op Note (Signed)
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  She has had prior colectomy and colostomy formation as well as chemotherapy.  The patient presented late last year with right hydronephrosis without evidence of abdominal mass.  She underwent stenting in December.  She underwent stent change in June of this year.  Recent presentation with flank pain, during evaluation for urine infection.  She was found to have a migrated stent.  This has become more painful to her, and she presents at this time, after being worked in in the office today, for stent exchange.  I have discussed the procedure with the patient as well as risks and complications.  She is aware of these and desires to proceed.  Description of procedure: The patient was properly identified and marked in the holding area.  She received preoperative IV antibiotics.  She was taken to the operating room where general anesthetic was administered with the LMA.  She was placed in the dorsolithotomy position.  Genitalia and perineum were prepped and draped.  Proper timeout was performed.  21 French panendoscope advanced into her bladder which was inspected and found to be normal with the exception of the right ureteral orifice where double-J stent was present.  About half the length of the stent was curled in her bladder.  The end of the stent was grasped and brought out through her urethra.  The guidewire was passed through the stent and up into what I figured was the renal pelvis.  The  open-ended catheter was then placed over top of the guidewire and the guidewire removed for retrograde ureteropyelogram with Omnipaque.  This revealed a tortuous proximal ureter. It was evident that more than likely there was significant pyelocaliectasis.  With the assistance of fluoroscopy I guided the guidewire back into the open-ended catheter and then into the renal pelvis.  Following this, the 24 cm x 6 French contour double-J stent was advanced over top of the guidewire.  Once adequate positioning was felt, the guidewire was removed.  Excellent curl was seen proximally and distally in the renal pelvis and bladder using both fluoroscopy and cystoscopy, respectively.  At this point, the scope was removed and the bladder drained.  This terminated the procedure.  The patient was then awakened and extubated, taken to the PACU in stable condition.  She tolerated the procedure well.

## 2019-01-11 NOTE — Anesthesia Procedure Notes (Signed)
Procedure Name: LMA Insertion Date/Time: 01/11/2019 5:42 PM Performed by: West Pugh, CRNA Pre-anesthesia Checklist: Patient identified, Emergency Drugs available, Suction available, Patient being monitored and Timeout performed Patient Re-evaluated:Patient Re-evaluated prior to induction Oxygen Delivery Method: Circle system utilized Preoxygenation: Pre-oxygenation with 100% oxygen Induction Type: IV induction Ventilation: Mask ventilation without difficulty LMA: LMA inserted LMA Size: 4.0 Number of attempts: 1 Placement Confirmation: positive ETCO2 Tube secured with: Tape Dental Injury: Teeth and Oropharynx as per pre-operative assessment

## 2019-01-11 NOTE — Transfer of Care (Signed)
Immediate Anesthesia Transfer of Care Note  Patient: Carla Mills  Procedure(s) Performed: CYSTOSCOPY, RETROGRADE PYELOGRAM, URETERAL STENT EXCHANGE (Right )  Patient Location: PACU  Anesthesia Type:General  Level of Consciousness: awake, alert , oriented and patient cooperative  Airway & Oxygen Therapy: Patient Spontanous Breathing and Patient connected to face mask oxygen  Post-op Assessment: Report given to RN and Post -op Vital signs reviewed and stable  Post vital signs: Reviewed and stable  Last Vitals:  Vitals Value Taken Time  BP 131/69 01/11/19 1818  Temp    Pulse 74 01/11/19 1820  Resp 18 01/11/19 1820  SpO2 100 % 01/11/19 1820  Vitals shown include unvalidated device data.  Last Pain:  Vitals:   01/11/19 1510  TempSrc: Oral         Complications: No apparent anesthesia complications

## 2019-01-11 NOTE — H&P (Signed)
H&P  Chief Complaint: Migrated double-J stent  History of Present Illness: Carla Mills is a 48 y.o. year old female  With a history of  Adenocarcinoma of the colon, status post initial resection in 2018.  She has received chemotherapy.  She developed right-sided hydronephrosis and underwent cystoscopy, retrograde and double-J stent placement in December of 2019.  Her stent was changed in early June 2020.  She recently presented with flank pain, as well as urinary tract infection.  She was found to have a migrated stent.  She has had significant pain with this.  She presents at this time for cysto with stent replacement.  Past Medical History:  Diagnosis Date  . 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 Colonic cancer ([place]) 03/09/2015   last chemo sept 2019  . History of chemotherapy    completed on , radiation x 25 tx feb and march 2019  . History of difficult venous access   . PONV (postoperative nausea and vomiting)   . Rectal mass 02/20/15   Rectal/sigmoid mass    Past Surgical History:  Procedure Laterality Date  . ABDOMINAL HYSTERECTOMY  12/03/2017   at [place] uterus removed and tumor removed tumor lower part of colon and colostomy revision  . APPENDECTOMY  02/20/15   Abnormal appearing  . CESAREAN SECTION     x 2  . COLON RESECTION N/A 03/07/2017   Procedure: EXPLORATORY LAPAROTOMY AND CREATION OF A COLOSTOMY AND MUCOUS FISTULA;  Surgeon: Johnathan Hausen, MD;  Location: [facility];  Service: General;  Laterality: N/A;  . COLONOSCOPY N/A 10/09/2015   Procedure: COLONOSCOPY;  Surgeon: Leighton Ruff, MD;  Location: [facility];  Service: Endoscopy;  Laterality: N/A;  . COLOSTOMY  02/20/15   Sigmoid/rectal mass  . COLOSTOMY N/A 02/20/2015   Procedure: COLOSTOMY;  Surgeon: [name], MD;  Location: [place];  Service: General;  Laterality: N/A;  . COLOSTOMY REVERSAL N/A 11/15/2015   Procedure: COLOSTOMY REVERSAL;   Surgeon: [name], MD;  Location: [facility];  Service: General;  Laterality: N/A;  . CYSTOSCOPY W/ URETERAL STENT PLACEMENT Right 03/16/2018   Procedure: CYSTOSCOPY WITH RETROGRADE PYELOGRAM/URETERAL STENT PLACEMENT;  Surgeon: Franchot Gallo, MD;  Location: [facility];  Service: Urology;  Laterality: Right;  30 MINS  . CYSTOSCOPY W/ URETERAL STENT PLACEMENT Right 08/24/2018   Procedure: CYSTOSCOPY WITH RETROGRADE PYELOGRAM/URETERAL STENT EXCHANGE;  Surgeon: Franchot Gallo, MD;  Location: [facility];  Service: Urology;  Laterality: Right;  . CYSTOSCOPY W/ URETERAL STENT REMOVAL N/A 08/24/2018   Procedure: CYSTOSCOPY WITH STENT EXCHANGE;  Surgeon: Franchot Gallo, MD;  Location: [facility];  Service: Urology;  Laterality: N/A;  . EXPLORATORY LAPAROTOMY  02/20/15   Sigmoid/rectal mass  . FLEXIBLE SIGMOIDOSCOPY N/A 02/15/2015   Procedure: FLEXIBLE SIGMOIDOSCOPY;  Surgeon: [name], MD;  Location: [facility];  Service: Endoscopy;  Laterality: N/A;  . FLEXIBLE SIGMOIDOSCOPY N/A 03/06/2017   Procedure: FLEXIBLE SIGMOIDOSCOPY;  Surgeon: Yetta Flock, MD;  Location[facility];  Service: Gastroenterology;  Laterality: N/A;  . FLEXIBLE SIGMOIDOSCOPY N/A 08/22/2017   Procedure: FLEXIBLE SIGMOIDOSCOPY;  Surgeon: Johnathan Hausen, MD;  Location: [facility];  Service: General;  Laterality: N/A;  . HIPEC procedure  07/29/2016   done at [place]  04/08/2016   IR [place] RIGHT 04/08/2016 [name], MD [facility]  . IR GENERIC HISTORICAL  04/08/2016  IR US GUIDE VASC ACCESS RIGHT 04/08/2016 [name], MD [facility]  . LAPAROSCOPIC LYSIS OF ADHESIONS N/A 11/15/2015   Procedure: LAPAROSCOPIC LYSIS OF ADHESIONS;  Surgeon: [name], MD;  Location: [facility];  Service: General;  Laterality: N/A;  . LAPAROSCOPY N/A 02/20/2015   Procedure: LAPAROSCOPY DIAGNOSTIC;   Surgeon: [name], MD;  Location: [place];  Service: General;  Laterality: N/A;  . LAPAROSCOPY ABDOMEN DIAGNOSTIC  02/20/15   Converted to open - Sigmoid/rectal mass  . LAPAROTOMY N/A 02/20/2015   Procedure: EXPLORATORY LAPAROTOMY AND PARTIAL COLECTOMY;  Surgeon: [name], MD;  Location: [place];  Service: General;  Laterality: N/A;  . LOW ANTERIOR BOWEL RESECTION  02/20/15   Sigmoid/rectal mass  . OSTEOTOMY     reconstructed coccyx   . reconstructed nerve in ankle  Left    bone redone  . TONSILLECTOMY      Home Medications:  Medications Prior to Admission  Medication Sig Dispense Refill  . cetirizine (ZYRTEC) 10 MG tablet Take 10 mg by mouth daily.    . fluticasone (FLONASE) 50 MCG/ACT nasal spray Place 1 spray into both nostrils 2 (two) times daily.     Marland Kitchen ibuprofen (ADVIL) 600 MG tablet Take 600 mg by mouth every 6 (six) hours as needed.    Marland Kitchen LORazepam (ATIVAN) 1 MG tablet Take 1 mg by mouth every 8 (eight) hours.    . traMADol (ULTRAM) 50 MG tablet Take 1 tablet (50 mg total) by mouth every 6 (six) hours as needed. 90 tablet 0    Allergies:  Allergies  Allergen Reactions  . Bactrim [Sulfamethoxazole-Trimethoprim] Swelling and Other (See Comments)    Severe generalized swelling  . Capecitabine Hives and Other (See Comments)    Legs and arms, after completing therapy  . Chlorhexidine Rash  . Oxaprozin Hives    Day pro  . Penicillins Hives and Other (See Comments)    Has patient had a PCN reaction causing immediate rash, facial/tongue/throat swelling, SOB or lightheadedness with hypotension: no Has patient had a PCN reaction causing severe rash involving mucus membranes or skin necrosis: {no Has patient had a PCN reaction that required hospitalization no Has patient had a PCN reaction occurring within the last 10 years: no If all of the above answers are "NO", then may proceed with Cephalosporin use.    History reviewed. No pertinent family history.  Social  History:  reports that she has never smoked. She has never used smokeless tobacco. She reports current alcohol use. She reports that she does not use drugs.  ROS: A complete review of systems was performed.  All systems are negative except for pertinent findings as noted.  Physical Exam:  Vital signs in last 24 hours: Temp:  [98.6 F (37 C)] 98.6 F (37 C) (10/19 1510) Pulse Rate:  [80] 80 (10/19 1510) Resp:  [18] 18 (10/19 1510) BP: (120)/(95) 120/95 (10/19 1510) SpO2:  [98 %] 98 % (10/19 1510) Weight:  [72.6 kg] 72.6 kg (10/19 1510) General:  Alert and oriented, No acute distress HEENT: Normocephalic, atraumatic Neck: No JVD or lymphadenopathy Cardiovascular: Regular rate and rhythm Lungs: Clear bilaterally Abdomen: Soft, nontender, nondistended, no abdominal masses Back: No CVA tenderness Extremities: No edema Neurologic: Grossly intact  Laboratory Data:  No results found for this or any previous visit (from the past 24 hour(s)). No results found for this or any previous visit (from the past 240 hour(s)). Creatinine: Recent Labs    01/05/19 0800  CREATININE 1.03*  Radiologic Imaging: No results found.  Impression/Assessment:   Migrated double-J stent  Plan:   cystoscopy, right double-J stent exchange the  Jorja Loa 01/11/2019, 4:31 PM  Lillette Boxer. Zelphia Glover MD

## 2019-01-11 NOTE — Anesthesia Postprocedure Evaluation (Signed)
Anesthesia Post Note  Patient: Kairee Cliver  Procedure(s) Performed: CYSTOSCOPY, RETROGRADE PYELOGRAM, URETERAL STENT EXCHANGE (Right )     Patient location during evaluation: PACU Anesthesia Type: General Level of consciousness: awake and alert Pain management: pain level controlled Vital Signs Assessment: post-procedure vital signs reviewed and stable Respiratory status: spontaneous breathing, nonlabored ventilation, respiratory function stable and patient connected to nasal cannula oxygen Cardiovascular status: blood pressure returned to baseline and stable Postop Assessment: no apparent nausea or vomiting Anesthetic complications: no    Last Vitals:  Vitals:   01/11/19 1840 01/11/19 1905  BP: 122/80 108/81  Pulse: 70 72  Resp: 17 16  Temp: (!) 36.4 C 36.6 C  SpO2: 100% 100%    Last Pain:  Vitals:   01/11/19 1905  TempSrc:   PainSc: 0-No pain                 Minnetta Sandora P Joriel Streety

## 2019-01-12 ENCOUNTER — Encounter (HOSPITAL_COMMUNITY): Payer: Self-pay | Admitting: Urology

## 2019-01-12 MED FILL — DOXYCYCLINE HYCLATE 100 MG: 100 | 8 days supply | Qty: 15 | Fill #0

## 2019-01-15 IMAGING — PT NM PET TUM IMG RESTAG (PS) SKULL BASE T - THIGH
1 of 8 series · 1 of 25 positions shown · non-contrast
Comparison: 07/24/2017.

CLINICAL DATA: Subsequent treatment strategy for metastatic colon
cancer.

EXAM:
NUCLEAR MEDICINE PET SKULL BASE TO THIGH
TECHNIQUE: 7.7 mCi F-18 FDG was injected intravenously. Full-ring PET imaging
was performed from the skull base to thigh after the radiotracer. CT
data was obtained and used for attenuation correction and anatomic
localization.
Fasting blood glucose: 94 mg/dl

[Series 4: ct sk_thigh 5.0 b31f · axial · 5.0mm · 0.98mm/px · 1 of 233 slices shown]
[im 233/233  brain]
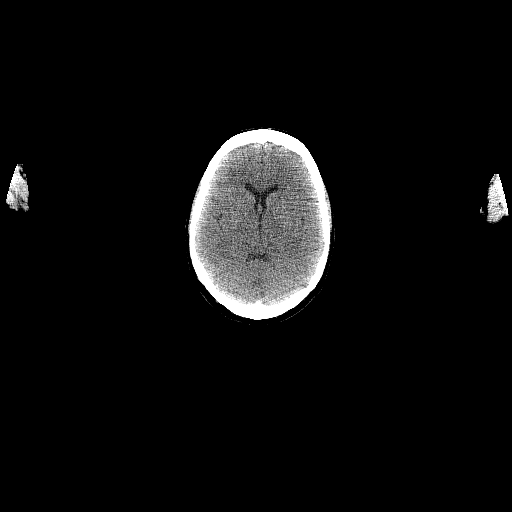

[1 of 25 positions shown; findings below may reference images not displayed]

FINDINGS: Mediastinal blood pool activity: SUV max

NECK: No hypermetabolic lymph nodes in the neck.

Incidental CT findings: None.

CHEST: No hypermetabolic mediastinal, hilar or axillary lymph nodes.
No hypermetabolic pulmonary nodules.

Incidental CT findings: Right IJ Port-A-Cath terminates in high
right atrium. No pericardial or pleural effusion.

ABDOMEN/PELVIS: Mild hypermetabolism is again seen along the
subcutaneous course the left lower quadrant colostomy. Minimal
residual focal hypermetabolism in the pelvis, associated with
sigmoid wall thickening (CT image 168), with an SUV max of 5.6,
compared to 9.8 previously. No abnormal hypermetabolism in the
liver, adrenal glands, spleen or pancreas. No hypermetabolic lymph
nodes.

Incidental CT findings: None.

SKELETON: No abnormal osseous hypermetabolism.

Incidental CT findings: None.
IMPRESSION: 1. Mild residual hypermetabolism associated with the rectosigmoid
colon, improved from prior. No evidence of distant metastatic
disease.
2. Persistent mild hypermetabolism associated with the left lower
quadrant colostomy.

## 2019-01-20 DIAGNOSIS — M545 Low back pain: Secondary | ICD-10-CM | POA: Diagnosis not present

## 2019-01-20 DIAGNOSIS — M9904 Segmental and somatic dysfunction of sacral region: Secondary | ICD-10-CM | POA: Diagnosis not present

## 2019-01-20 DIAGNOSIS — M9903 Segmental and somatic dysfunction of lumbar region: Secondary | ICD-10-CM | POA: Diagnosis not present

## 2019-01-20 DIAGNOSIS — M62830 Muscle spasm of back: Secondary | ICD-10-CM | POA: Diagnosis not present

## 2019-01-22 DIAGNOSIS — N131 Hydronephrosis with ureteral stricture, not elsewhere classified: Secondary | ICD-10-CM | POA: Diagnosis not present

## 2019-01-29 ENCOUNTER — Ambulatory Visit (HOSPITAL_BASED_OUTPATIENT_CLINIC_OR_DEPARTMENT_OTHER): Admit: 2019-01-29 | Payer: BC Managed Care – PPO | Admitting: Urology

## 2019-01-29 ENCOUNTER — Encounter (HOSPITAL_BASED_OUTPATIENT_CLINIC_OR_DEPARTMENT_OTHER): Payer: Self-pay

## 2019-01-29 SURGERY — CYSTOSCOPY, WITH RETROGRADE PYELOGRAM AND URETERAL STENT INSERTION
Anesthesia: General | Laterality: Right

## 2019-02-02 ENCOUNTER — Inpatient Hospital Stay: Payer: BC Managed Care – PPO | Attending: Radiation Oncology

## 2019-02-02 ENCOUNTER — Inpatient Hospital Stay: Payer: BC Managed Care – PPO

## 2019-02-02 ENCOUNTER — Other Ambulatory Visit: Payer: Self-pay

## 2019-02-02 ENCOUNTER — Other Ambulatory Visit: Payer: Self-pay | Admitting: *Deleted

## 2019-02-02 ENCOUNTER — Telehealth: Payer: Self-pay | Admitting: *Deleted

## 2019-02-02 DIAGNOSIS — C189 Malignant neoplasm of colon, unspecified: Secondary | ICD-10-CM | POA: Diagnosis not present

## 2019-02-02 DIAGNOSIS — C772 Secondary and unspecified malignant neoplasm of intra-abdominal lymph nodes: Secondary | ICD-10-CM

## 2019-02-02 DIAGNOSIS — C187 Malignant neoplasm of sigmoid colon: Secondary | ICD-10-CM

## 2019-02-02 DIAGNOSIS — Z95828 Presence of other vascular implants and grafts: Secondary | ICD-10-CM

## 2019-02-02 DIAGNOSIS — C20 Malignant neoplasm of rectum: Secondary | ICD-10-CM

## 2019-02-02 DIAGNOSIS — D509 Iron deficiency anemia, unspecified: Secondary | ICD-10-CM | POA: Insufficient documentation

## 2019-02-02 LAB — CBC WITH DIFFERENTIAL (CANCER CENTER ONLY)
Abs Immature Granulocytes: 0.04 10*3/uL (ref 0.00–0.07)
Basophils Absolute: 0.1 10*3/uL (ref 0.0–0.1)
Basophils Relative: 0 %
Eosinophils Absolute: 0.7 10*3/uL — ABNORMAL HIGH (ref 0.0–0.5)
Eosinophils Relative: 6 %
HCT: 34.3 % — ABNORMAL LOW (ref 36.0–46.0)
Hemoglobin: 11 g/dL — ABNORMAL LOW (ref 12.0–15.0)
Immature Granulocytes: 0 %
Lymphocytes Relative: 12 %
Lymphs Abs: 1.5 10*3/uL (ref 0.7–4.0)
MCH: 27.9 pg (ref 26.0–34.0)
MCHC: 32.1 g/dL (ref 30.0–36.0)
MCV: 87.1 fL (ref 80.0–100.0)
Monocytes Absolute: 1 10*3/uL (ref 0.1–1.0)
Monocytes Relative: 8 %
Neutro Abs: 9.5 10*3/uL — ABNORMAL HIGH (ref 1.7–7.7)
Neutrophils Relative %: 74 %
Platelet Count: 418 10*3/uL — ABNORMAL HIGH (ref 150–400)
RBC: 3.94 MIL/uL (ref 3.87–5.11)
RDW: 12.7 % (ref 11.5–15.5)
WBC Count: 12.9 10*3/uL — ABNORMAL HIGH (ref 4.0–10.5)
nRBC: 0 % (ref 0.0–0.2)

## 2019-02-02 LAB — CMP (CANCER CENTER ONLY)
ALT: 27 U/L (ref 0–44)
AST: 23 U/L (ref 15–41)
Albumin: 3.9 g/dL (ref 3.5–5.0)
Alkaline Phosphatase: 84 U/L (ref 38–126)
Anion gap: 9 (ref 5–15)
BUN: 17 mg/dL (ref 6–20)
CO2: 27 mmol/L (ref 22–32)
Calcium: 8.8 mg/dL — ABNORMAL LOW (ref 8.9–10.3)
Chloride: 103 mmol/L (ref 98–111)
Creatinine: 1.07 mg/dL — ABNORMAL HIGH (ref 0.44–1.00)
GFR, Est AFR Am: >60 mL/min (ref >60)
GFR, Estimated: >60 mL/min (ref >60)
Glucose, Bld: 117 mg/dL — ABNORMAL HIGH (ref 70–99)
Potassium: 3.9 mmol/L (ref 3.5–5.1)
Sodium: 139 mmol/L (ref 135–145)
Total Bilirubin: 0.3 mg/dL (ref 0.3–1.2)
Total Protein: 7.1 g/dL (ref 6.5–8.1)

## 2019-02-02 MED ORDER — HEPARIN SOD (PORK) LOCK FLUSH 100 UNIT/ML IV SOLN
500.0000 [IU] | Freq: Once | INTRAVENOUS | Status: AC
  Administered 2019-02-02: 15:00:00 500 [IU] via INTRAVENOUS
  Filled 2019-02-02: qty 5

## 2019-02-02 MED ORDER — SODIUM CHLORIDE 0.9% FLUSH
10.0000 mL | INTRAVENOUS | Status: DC | PRN
  Administered 2019-02-02: 10 mL via INTRAVENOUS
  Filled 2019-02-02: qty 10

## 2019-02-02 NOTE — Patient Instructions (Signed)

## 2019-02-02 NOTE — Telephone Encounter (Signed)
Call received from patient stating that she is "extremely tired" and would like to know if she can come in for blood work.  Dr. Marin Olp notified and order received for patient to come in for labs at her convenience.  Pt notified and transferred to scheduling.

## 2019-02-03 ENCOUNTER — Encounter: Payer: Self-pay | Admitting: *Deleted

## 2019-02-03 ENCOUNTER — Telehealth: Payer: Self-pay | Admitting: Hematology & Oncology

## 2019-02-03 DIAGNOSIS — M545 Low back pain: Secondary | ICD-10-CM | POA: Diagnosis not present

## 2019-02-03 DIAGNOSIS — M9904 Segmental and somatic dysfunction of sacral region: Secondary | ICD-10-CM | POA: Diagnosis not present

## 2019-02-03 DIAGNOSIS — M62830 Muscle spasm of back: Secondary | ICD-10-CM | POA: Diagnosis not present

## 2019-02-03 DIAGNOSIS — M9903 Segmental and somatic dysfunction of lumbar region: Secondary | ICD-10-CM | POA: Diagnosis not present

## 2019-02-03 LAB — FERRITIN: Ferritin: 652 ng/mL — ABNORMAL HIGH (ref 11–307)

## 2019-02-03 LAB — IRON AND TIBC
Iron: 15 ug/dL — ABNORMAL LOW (ref 41–142)
Saturation Ratios: 8 % — ABNORMAL LOW (ref 21–57)
TIBC: 195 ug/dL — ABNORMAL LOW (ref 236–444)
UIBC: 180 ug/dL (ref 120–384)

## 2019-02-03 NOTE — Telephone Encounter (Signed)
Called and spoke with patient regarding appointment added for Feraheme per 11/11 sch msg

## 2019-02-03 NOTE — Progress Notes (Signed)
Order received from Dr. Marin Olp for pt to come in for one dose of IV iron.  Message sent to scheduling.

## 2019-02-05 ENCOUNTER — Other Ambulatory Visit: Payer: Self-pay

## 2019-02-05 ENCOUNTER — Other Ambulatory Visit: Payer: Self-pay | Admitting: Family

## 2019-02-05 ENCOUNTER — Inpatient Hospital Stay: Payer: BC Managed Care – PPO

## 2019-02-05 VITALS — BP 123/86 | HR 82 | Temp 97.3°F | Resp 17 | Ht 65.0 in | Wt 162.0 lb

## 2019-02-05 DIAGNOSIS — D62 Acute posthemorrhagic anemia: Secondary | ICD-10-CM

## 2019-02-05 DIAGNOSIS — D509 Iron deficiency anemia, unspecified: Secondary | ICD-10-CM | POA: Diagnosis not present

## 2019-02-05 DIAGNOSIS — D500 Iron deficiency anemia secondary to blood loss (chronic): Secondary | ICD-10-CM

## 2019-02-05 DIAGNOSIS — C189 Malignant neoplasm of colon, unspecified: Secondary | ICD-10-CM | POA: Diagnosis not present

## 2019-02-05 MED ORDER — SODIUM CHLORIDE 0.9 % IV SOLN
510.0000 mg | Freq: Once | INTRAVENOUS | Status: AC
  Administered 2019-02-05: 11:00:00 510 mg via INTRAVENOUS
  Filled 2019-02-05: qty 17

## 2019-02-05 MED ORDER — SODIUM CHLORIDE 0.9% FLUSH
3.0000 mL | Freq: Once | INTRAVENOUS | Status: DC | PRN
  Filled 2019-02-05: qty 10

## 2019-02-05 MED ORDER — HEPARIN SOD (PORK) LOCK FLUSH 100 UNIT/ML IV SOLN
250.0000 [IU] | Freq: Once | INTRAVENOUS | Status: DC | PRN
  Filled 2019-02-05: qty 5

## 2019-02-05 MED ORDER — SODIUM CHLORIDE 0.9% FLUSH
10.0000 mL | Freq: Once | INTRAVENOUS | Status: AC | PRN
  Administered 2019-02-05: 12:00:00 10 mL
  Filled 2019-02-05: qty 10

## 2019-02-05 MED ORDER — ALTEPLASE 2 MG IJ SOLR
2.0000 mg | Freq: Once | INTRAMUSCULAR | Status: DC | PRN
  Filled 2019-02-05: qty 2

## 2019-02-05 MED ORDER — HEPARIN SOD (PORK) LOCK FLUSH 100 UNIT/ML IV SOLN
500.0000 [IU] | Freq: Once | INTRAVENOUS | Status: AC | PRN
  Administered 2019-02-05: 500 [IU]
  Filled 2019-02-05: qty 5

## 2019-02-05 MED ORDER — SODIUM CHLORIDE 0.9 % IV SOLN
Freq: Once | INTRAVENOUS | Status: AC
  Administered 2019-02-05: 11:00:00 via INTRAVENOUS
  Filled 2019-02-05: qty 250

## 2019-02-05 MED ORDER — SODIUM CHLORIDE 0.9 % IV SOLN
750.0000 mg | Freq: Once | INTRAVENOUS | Status: DC
  Filled 2019-02-05: qty 15

## 2019-02-05 MED ORDER — SODIUM CHLORIDE 0.9 % IV SOLN
Freq: Once | INTRAVENOUS | Status: DC
  Filled 2019-02-05: qty 250

## 2019-02-05 NOTE — Patient Instructions (Signed)

## 2019-02-08 DIAGNOSIS — Z20828 Contact with and (suspected) exposure to other viral communicable diseases: Secondary | ICD-10-CM | POA: Diagnosis not present

## 2019-02-08 DIAGNOSIS — Z03818 Encounter for observation for suspected exposure to other biological agents ruled out: Secondary | ICD-10-CM | POA: Diagnosis not present

## 2019-02-09 DIAGNOSIS — Z933 Colostomy status: Secondary | ICD-10-CM | POA: Diagnosis not present

## 2019-02-09 DIAGNOSIS — Z85038 Personal history of other malignant neoplasm of large intestine: Secondary | ICD-10-CM | POA: Diagnosis not present

## 2019-02-12 DIAGNOSIS — N131 Hydronephrosis with ureteral stricture, not elsewhere classified: Secondary | ICD-10-CM | POA: Diagnosis not present

## 2019-02-12 DIAGNOSIS — N3000 Acute cystitis without hematuria: Secondary | ICD-10-CM | POA: Diagnosis not present

## 2019-02-12 MED FILL — DOXYCYCLINE HYCLATE 100 MG: 100 | 7 days supply | Qty: 14 | Fill #0

## 2019-02-24 DIAGNOSIS — M545 Low back pain: Secondary | ICD-10-CM | POA: Diagnosis not present

## 2019-02-24 DIAGNOSIS — M9903 Segmental and somatic dysfunction of lumbar region: Secondary | ICD-10-CM | POA: Diagnosis not present

## 2019-02-24 DIAGNOSIS — M62830 Muscle spasm of back: Secondary | ICD-10-CM | POA: Diagnosis not present

## 2019-02-24 DIAGNOSIS — M9904 Segmental and somatic dysfunction of sacral region: Secondary | ICD-10-CM | POA: Diagnosis not present

## 2019-02-25 ENCOUNTER — Telehealth: Payer: Self-pay | Admitting: *Deleted

## 2019-02-25 NOTE — Telephone Encounter (Signed)
Call received from patient to inform Dr. Marin Olp that she is having a CT scan on Tuesday ordered by Dr. Vernard Gambles d/t she is having quarter size blood clots from her vaginal/rectal area.  Dr. Marin Olp notified.

## 2019-03-02 DIAGNOSIS — Z880 Allergy status to penicillin: Secondary | ICD-10-CM | POA: Diagnosis not present

## 2019-03-02 DIAGNOSIS — K7689 Other specified diseases of liver: Secondary | ICD-10-CM | POA: Diagnosis not present

## 2019-03-02 DIAGNOSIS — N939 Abnormal uterine and vaginal bleeding, unspecified: Secondary | ICD-10-CM | POA: Diagnosis not present

## 2019-03-02 DIAGNOSIS — Z882 Allergy status to sulfonamides status: Secondary | ICD-10-CM | POA: Diagnosis not present

## 2019-03-02 DIAGNOSIS — N1330 Unspecified hydronephrosis: Secondary | ICD-10-CM | POA: Diagnosis not present

## 2019-03-02 DIAGNOSIS — Z888 Allergy status to other drugs, medicaments and biological substances status: Secondary | ICD-10-CM | POA: Diagnosis not present

## 2019-03-02 DIAGNOSIS — C188 Malignant neoplasm of overlapping sites of colon: Secondary | ICD-10-CM | POA: Diagnosis not present

## 2019-03-02 DIAGNOSIS — N824 Other female intestinal-genital tract fistulae: Secondary | ICD-10-CM | POA: Diagnosis not present

## 2019-03-03 ENCOUNTER — Telehealth: Payer: Self-pay

## 2019-03-03 NOTE — Telephone Encounter (Signed)
Received call from pt stating she had routine CT CAP with Dr Crisoforo Oxford at [facility] this week that reveals hydronephrosis and a possible cancer recurrence. Pt has appts with nephrologist for probable nephrostomy tube placement and with Dr Crisoforo Oxford for biopsy of new extraperitoneum tumor vs dense fluid collection. Pt questions if she needs to keep CT/Lab/MD appts here next week. Per pt, Dr Crisoforo Oxford is to contact Dr Marin Olp as well.   Per Dr Marin Olp, all appts at this office cancelled at this time. Patient is aware to contact our office for follow up and for any questions, concerns or support. Verbalizes appreciation. dph

## 2019-03-04 DIAGNOSIS — N131 Hydronephrosis with ureteral stricture, not elsewhere classified: Secondary | ICD-10-CM | POA: Diagnosis not present

## 2019-03-04 DIAGNOSIS — N1330 Unspecified hydronephrosis: Secondary | ICD-10-CM | POA: Diagnosis not present

## 2019-03-05 DIAGNOSIS — N131 Hydronephrosis with ureteral stricture, not elsewhere classified: Secondary | ICD-10-CM | POA: Diagnosis not present

## 2019-03-05 DIAGNOSIS — R8271 Bacteriuria: Secondary | ICD-10-CM | POA: Diagnosis not present

## 2019-03-08 DIAGNOSIS — Z1211 Encounter for screening for malignant neoplasm of colon: Secondary | ICD-10-CM | POA: Diagnosis not present

## 2019-03-08 DIAGNOSIS — C189 Malignant neoplasm of colon, unspecified: Secondary | ICD-10-CM | POA: Diagnosis not present

## 2019-03-08 DIAGNOSIS — N1330 Unspecified hydronephrosis: Secondary | ICD-10-CM | POA: Diagnosis not present

## 2019-03-08 DIAGNOSIS — C19 Malignant neoplasm of rectosigmoid junction: Secondary | ICD-10-CM | POA: Diagnosis not present

## 2019-03-08 DIAGNOSIS — C20 Malignant neoplasm of rectum: Secondary | ICD-10-CM | POA: Diagnosis not present

## 2019-03-08 DIAGNOSIS — Z85038 Personal history of other malignant neoplasm of large intestine: Secondary | ICD-10-CM | POA: Diagnosis not present

## 2019-03-08 DIAGNOSIS — Z9049 Acquired absence of other specified parts of digestive tract: Secondary | ICD-10-CM | POA: Diagnosis not present

## 2019-03-09 ENCOUNTER — Ambulatory Visit: Payer: BC Managed Care – PPO | Admitting: Family

## 2019-03-09 ENCOUNTER — Ambulatory Visit (HOSPITAL_BASED_OUTPATIENT_CLINIC_OR_DEPARTMENT_OTHER): Payer: BC Managed Care – PPO

## 2019-03-09 ENCOUNTER — Other Ambulatory Visit: Payer: BC Managed Care – PPO

## 2019-03-09 ENCOUNTER — Other Ambulatory Visit (HOSPITAL_BASED_OUTPATIENT_CLINIC_OR_DEPARTMENT_OTHER): Payer: BC Managed Care – PPO

## 2019-03-09 ENCOUNTER — Inpatient Hospital Stay: Payer: BC Managed Care – PPO

## 2019-03-10 ENCOUNTER — Other Ambulatory Visit: Payer: Self-pay | Admitting: *Deleted

## 2019-03-10 ENCOUNTER — Ambulatory Visit: Payer: BC Managed Care – PPO | Admitting: Hematology & Oncology

## 2019-03-11 ENCOUNTER — Other Ambulatory Visit: Payer: Self-pay | Admitting: *Deleted

## 2019-03-11 ENCOUNTER — Encounter: Payer: Self-pay | Admitting: *Deleted

## 2019-03-11 NOTE — Progress Notes (Signed)
[facility] sent on rectal biopsy per order of Dr. Marin Olp.

## 2019-03-12 ENCOUNTER — Encounter: Payer: Self-pay | Admitting: *Deleted

## 2019-03-12 ENCOUNTER — Telehealth: Payer: Self-pay | Admitting: *Deleted

## 2019-03-12 ENCOUNTER — Telehealth: Payer: Self-pay

## 2019-03-12 DIAGNOSIS — N131 Hydronephrosis with ureteral stricture, not elsewhere classified: Secondary | ICD-10-CM | POA: Diagnosis not present

## 2019-03-12 NOTE — Telephone Encounter (Signed)
The pt walked into the office with questions regarding her colostomy. She has not been seen in this office since 08/2017 by Nicoletta Ba PA.  I spoke with the pt and she was visibly upset and frustrated.  She stated her cancer  Is back and has questions about her colostomy.  I referred her to her surgeon at [facility] and she states they are no help.  I ask her about primary care or oncology.  She says everyone says here have this surgery and then no one wants to help.  I apologized that I could not help her and advised her again to contact her surgeon or Dr Marin Olp oncology.  She says she does not have a PCP.  She left the office and I went back to my office and called her surgeon at [facility] Dr Crisoforo Oxford and ask his nurse could they please reach out to her to help with her questions.  I explained she was very upset and frustrated.  The nurse did agree to call her today.

## 2019-03-15 ENCOUNTER — Telehealth: Payer: Self-pay | Admitting: Hematology & Oncology

## 2019-03-15 NOTE — Telephone Encounter (Signed)
Called and spoke with patient regarding appointments added per 12/18 sch msg

## 2019-03-16 ENCOUNTER — Inpatient Hospital Stay: Payer: BC Managed Care – PPO

## 2019-03-16 ENCOUNTER — Other Ambulatory Visit: Payer: Self-pay

## 2019-03-16 ENCOUNTER — Inpatient Hospital Stay: Payer: BC Managed Care – PPO | Attending: Radiation Oncology

## 2019-03-16 ENCOUNTER — Encounter: Payer: Self-pay | Admitting: Hematology & Oncology

## 2019-03-16 ENCOUNTER — Inpatient Hospital Stay: Payer: BC Managed Care – PPO | Admitting: Hematology & Oncology

## 2019-03-16 VITALS — BP 116/73 | HR 72 | Temp 97.9°F | Resp 18 | Wt 162.0 lb

## 2019-03-16 DIAGNOSIS — Z5111 Encounter for antineoplastic chemotherapy: Secondary | ICD-10-CM | POA: Diagnosis present

## 2019-03-16 DIAGNOSIS — C189 Malignant neoplasm of colon, unspecified: Secondary | ICD-10-CM | POA: Diagnosis not present

## 2019-03-16 DIAGNOSIS — C772 Secondary and unspecified malignant neoplasm of intra-abdominal lymph nodes: Secondary | ICD-10-CM

## 2019-03-16 DIAGNOSIS — C187 Malignant neoplasm of sigmoid colon: Secondary | ICD-10-CM

## 2019-03-16 DIAGNOSIS — C20 Malignant neoplasm of rectum: Secondary | ICD-10-CM

## 2019-03-16 LAB — CMP (CANCER CENTER ONLY)
ALT: 7 U/L (ref 0–44)
AST: 11 U/L — ABNORMAL LOW (ref 15–41)
Albumin: 3.7 g/dL (ref 3.5–5.0)
Alkaline Phosphatase: 56 U/L (ref 38–126)
Anion gap: 8 (ref 5–15)
BUN: 18 mg/dL (ref 6–20)
CO2: 26 mmol/L (ref 22–32)
Calcium: 8.9 mg/dL (ref 8.9–10.3)
Chloride: 108 mmol/L (ref 98–111)
Creatinine: 1.14 mg/dL — ABNORMAL HIGH (ref 0.44–1.00)
GFR, Est AFR Am: >60 mL/min (ref >60)
GFR, Estimated: 57 mL/min — ABNORMAL LOW (ref >60)
Glucose, Bld: 106 mg/dL — ABNORMAL HIGH (ref 70–99)
Potassium: 3.9 mmol/L (ref 3.5–5.1)
Sodium: 142 mmol/L (ref 135–145)
Total Bilirubin: 0.3 mg/dL (ref 0.3–1.2)
Total Protein: 6.6 g/dL (ref 6.5–8.1)

## 2019-03-16 LAB — CBC WITH DIFFERENTIAL (CANCER CENTER ONLY)
Abs Immature Granulocytes: 0.04 10*3/uL (ref 0.00–0.07)
Basophils Absolute: 0.1 10*3/uL (ref 0.0–0.1)
Basophils Relative: 1 %
Eosinophils Absolute: 1.5 10*3/uL — ABNORMAL HIGH (ref 0.0–0.5)
Eosinophils Relative: 12 %
HCT: 32.5 % — ABNORMAL LOW (ref 36.0–46.0)
Hemoglobin: 10.3 g/dL — ABNORMAL LOW (ref 12.0–15.0)
Immature Granulocytes: 0 %
Lymphocytes Relative: 13 %
Lymphs Abs: 1.5 10*3/uL (ref 0.7–4.0)
MCH: 27.6 pg (ref 26.0–34.0)
MCHC: 31.7 g/dL (ref 30.0–36.0)
MCV: 87.1 fL (ref 80.0–100.0)
Monocytes Absolute: 0.7 10*3/uL (ref 0.1–1.0)
Monocytes Relative: 6 %
Neutro Abs: 8.2 10*3/uL — ABNORMAL HIGH (ref 1.7–7.7)
Neutrophils Relative %: 68 %
Platelet Count: 386 10*3/uL (ref 150–400)
RBC: 3.73 MIL/uL — ABNORMAL LOW (ref 3.87–5.11)
RDW: 13.7 % (ref 11.5–15.5)
WBC Count: 12 10*3/uL — ABNORMAL HIGH (ref 4.0–10.5)
nRBC: 0 % (ref 0.0–0.2)

## 2019-03-16 MED ORDER — SODIUM CHLORIDE 0.9% FLUSH
10.0000 mL | INTRAVENOUS | Status: DC | PRN
  Administered 2019-03-16: 10 mL via INTRAVENOUS
  Filled 2019-03-16: qty 10

## 2019-03-16 MED ORDER — HEPARIN SOD (PORK) LOCK FLUSH 100 UNIT/ML IV SOLN
500.0000 [IU] | Freq: Once | INTRAVENOUS | Status: AC
  Administered 2019-03-16: 500 [IU] via INTRAVENOUS
  Filled 2019-03-16: qty 5

## 2019-03-16 MED ORDER — TRAMADOL HCL 50 MG PO TABS: 50.0000 mg | ORAL_TABLET | Freq: Four times a day (QID) | ORAL | 0 refills | Status: DC | PRN

## 2019-03-16 MED FILL — traMADol HCL 50 MG TABS: 50 | 15 days supply | Qty: 60 | Fill #0

## 2019-03-16 NOTE — Progress Notes (Signed)
DISCONTINUE OFF PATHWAY REGIMEN - Colorectal   OFF00120:Capecitabine + Bevacizumab (1250/7.5) q21 Days:   A cycle is every 21 days:     Capecitabine      Bevacizumab-xxxx   **Always confirm dose/schedule in your pharmacy ordering system**  REASON: Disease Progression PRIOR TREATMENT: Off Pathway: Capecitabine + Bevacizumab (1250/7.5) q21 Days TREATMENT RESPONSE: Partial Response (PR)  START OFF PATHWAY REGIMEN - Colorectal   [id number]:FOLFOXIRI (Fluorouracil 2,400 mg/m2/cycle) q14 Days:   A cycle is every 14 days:     Irinotecan      Oxaliplatin      Leucovorin      Fluorouracil   **Always confirm dose/schedule in your pharmacy ordering system**  Patient Characteristics: Distant Metastases, Nonsurgical Candidate, KRAS/NRAS Mutation Positive/Unknown (BRAF V600 Wild-Type/Unknown), Standard Cytotoxic Therapy, Second Line Standard Cytotoxic Therapy, Bevacizumab Ineligible Tumor Location: Colon Therapeutic Status: Distant Metastases Microsatellite/Mismatch Repair Status: Unknown BRAF Mutation Status: Awaiting Test Results KRAS/NRAS Mutation Status: Awaiting Test Results Standard Cytotoxic Line of Therapy: Second Line Standard Cytotoxic Therapy Bevacizumab Eligibility: Ineligible Intent of Therapy: Non-Curative / Palliative Intent, Discussed with Patient

## 2019-03-16 NOTE — Progress Notes (Signed)
Hematology and Oncology Follow Up Visit  Carla Mills [id number] Sep 30, 1970 48 y.o. 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  Unfortunately, we have documented recurrence again.  She was seen by Dr. Crisoforo Oxford at [facility].  As always, he does a fantastic job with her.  He took her to surgery on 03/08/2019.  This was because of some bleeding and pain that she was having.  The pathology report ([id number]) showed adenocarcinoma with areas of necrosis.  We are getting the tumor sent off for molecular analysis.  Hopefully we will be able to get this done.  She had a nephrostomy tube placed into her right kidney.  This was because of the tumor that was encroaching upon the kidney.  The nephrostomy seems to be working fairly well right now.  She does not wish to have a total pelvic exenteration.  This could be done with Dr. Crisoforo Oxford would like to try to get a tumor response to see if we can shrink the tumor before he needs to operate.  Unfortunately, we have not been able to use her CEA as a measure for response.  The CT scan that she had done at [facility] only shows pelvic recurrence.  This is always been her status in which when her tumor recurs is just in the pelvis.  I am amazed that she is yet to have metastatic disease to the liver, lung or bones.   As always, she looks fantastic.  She really has done incredibly well despite all the obstacles that have been placed  in front of her.  She has an incredible faith.  She has incredible support from her husband.  He has been incredibly busy.  The company that he works for is in the right business right now and they have really been active.    Overall, I also performed status is ECOG 1.  Allergies as of 03/16/2019      Reactions   Bactrim [sulfamethoxazole-trimethoprim] Swelling, Other (See Comments)   Severe generalized swelling   Capecitabine Hives, Other (See Comments)   Legs and arms, after completing therapy   Chlorhexidine Rash   Oxaprozin Hives   Day pro   Penicillins Hives, Other (See Comments)   Has patient had a PCN reaction causing immediate rash, facial/tongue/throat swelling, SOB or lightheadedness with hypotension: no Has patient had a PCN reaction causing severe rash involving mucus membranes or skin necrosis: {no Has patient had a PCN reaction that required hospitalization no Has patient had a PCN reaction occurring within the last 10 years: no If all of the above answers are "NO", then may proceed with Cephalosporin use.      Medication List       Accurate as of March 16, 2019  4:41 PM. If you have any questions, ask your nurse or doctor.        cefpodoxime 200 MG tablet Commonly known as: VANTIN Take 200 mg by mouth 2 (two) times daily.   cetirizine 10 MG  tablet Commonly known as: ZYRTEC Take 10 mg by mouth daily.   doxycycline 100 MG tablet Commonly known as: VIBRA-TABS Take 1 tablet (100 mg total) by mouth 2 (two) times daily.   fluticasone 50 MCG/ACT nasal spray Commonly known as: FLONASE Place 1 spray into both nostrils 2 (two) times daily.   ibuprofen 600 MG tablet Commonly known as: ADVIL Take 600 mg by mouth every 6 (six) hours as needed.   LORazepam 1 MG tablet Commonly known as: ATIVAN Take 1 mg by mouth every 8 (eight) hours.   traMADol 50 MG tablet Commonly known as: ULTRAM Take 1 tablet (50 mg total) by mouth every 6 (six) hours as needed. What  changed: Another medication with the same name was added. Make sure you understand how and when to take each. Changed by: Volanda Napoleon, MD   traMADol 50 MG tablet Commonly known as: ULTRAM Take 1 tablet (50 mg total) by mouth every 6 (six) hours as needed. What changed: You were already taking a medication with the same name, and this prescription was added. Make sure you understand how and when to take each. Changed by: Volanda Napoleon, MD       Allergies:  Allergies  Allergen Reactions  . Bactrim [Sulfamethoxazole-Trimethoprim] Swelling and Other (See Comments)    Severe generalized swelling  . Capecitabine Hives and Other (See Comments)    Legs and arms, after completing therapy  . Chlorhexidine Rash  . Oxaprozin Hives    Day pro  . Penicillins Hives and Other (See Comments)    Has patient had a PCN reaction causing immediate rash, facial/tongue/throat swelling, SOB or lightheadedness with hypotension: no Has patient had a PCN reaction causing severe rash involving mucus membranes or skin necrosis: {no Has patient had a PCN reaction that required hospitalization no Has patient had a PCN reaction occurring within the last 10 years: no If all of the above answers are "NO", then may proceed with Cephalosporin use.    Past Medical History, Surgical history, Social history, and Family History were reviewed and updated.  Review of Systems: Review of Systems  Constitutional: Negative.   HENT: Negative.   Eyes: Negative.   Respiratory: Negative.   Cardiovascular: Negative.   Gastrointestinal: Negative.   Genitourinary: Negative.   Musculoskeletal: Negative.   Skin: Negative.   Neurological: Negative.   Endo/Heme/Allergies: Negative.   Psychiatric/Behavioral: Negative.       Physical Exam:  weight is 162 lb (73.5 kg). Her temporal temperature is 97.9 F (36.6 C). Her blood pressure is 116/73 and her pulse is 72. Her respiration is 18 and oxygen saturation is 100%.    Wt Readings from Last 3 Encounters:  03/16/19 162 lb (73.5 kg)  02/05/19 162 lb (73.5 kg)  01/11/19 160 lb (72.6 kg)    Her vital signs show a temperature 97.6.  Pulse 76.  Blood pressure 114/75.  Physical Exam Vitals reviewed.  HENT:     Head: Normocephalic and atraumatic.  Eyes:     Pupils: Pupils are equal, round, and reactive to light.  Cardiovascular:     Rate and Rhythm: Normal rate and regular rhythm.     Heart sounds: Normal heart sounds.  Pulmonary:     Effort: Pulmonary effort is normal.     Breath sounds: Normal breath sounds.  Abdominal:     General: Bowel sounds are normal.     Palpations: Abdomen is soft.     Comments: Abdominal exam shows laparotomy scars that  are well-healed.  She has a colostomy in the left lower quadrant.  She has no fluid wave.  There is no guarding or rebound tenderness.  She has no palpable liver or spleen tip.  Musculoskeletal:        General: No tenderness or deformity. Normal range of motion.     Cervical back: Normal range of motion.  Lymphadenopathy:     Cervical: No cervical adenopathy.  Skin:    General: Skin is warm and dry.     Findings: No erythema or rash.  Neurological:     Mental Status: She is alert and oriented to person, place, and time.  Psychiatric:        Behavior: Behavior normal.        Thought Content: Thought content normal.        Judgment: Judgment normal.      Lab Results  Component Value Date   WBC 12.0 (H) 03/16/2019   HGB 10.3 (L) 03/16/2019   HCT 32.5 (L) 03/16/2019   MCV 87.1 03/16/2019   PLT 386 03/16/2019   Lab Results  Component Value Date   FERRITIN 652 (H) 02/02/2019   IRON 15 (L) 02/02/2019   TIBC 195 (L) 02/02/2019   UIBC 180 02/02/2019   IRONPCTSAT 8 (L) 02/02/2019   Lab Results  Component Value Date   RETICCTPCT 0.8 02/12/2015   RBC 3.73 (L) 03/16/2019   No results found for: KPAFRELGTCHN, LAMBDASER, KAPLAMBRATIO No results found for: IGGSERUM, IGA, IGMSERUM No results  found for: Kathrynn Ducking, MSPIKE, SPEI   Chemistry      Component Value Date/Time   NA 142 03/16/2019 1450   NA 145 03/27/2017 1101   NA 141 07/30/2016 0750   K 3.9 03/16/2019 1450   K 4.5 03/27/2017 1101   K 4.2 07/30/2016 0750   CL 108 03/16/2019 1450   CL 103 03/27/2017 1101   CO2 26 03/16/2019 1450   CO2 28 03/27/2017 1101   CO2 26 07/30/2016 0750   BUN 18 03/16/2019 1450   BUN 12 03/27/2017 1101   BUN 12.1 07/30/2016 0750   CREATININE 1.14 (H) 03/16/2019 1450   CREATININE 0.9 03/27/2017 1101   CREATININE 0.7 07/30/2016 0750      Component Value Date/Time   CALCIUM 8.9 03/16/2019 1450   CALCIUM 9.6 03/27/2017 1101   CALCIUM 9.5 07/30/2016 0750   ALKPHOS 56 03/16/2019 1450   ALKPHOS 101 (H) 03/27/2017 1101   ALKPHOS 72 07/30/2016 0750   AST 11 (L) 03/16/2019 1450   AST 22 07/30/2016 0750   ALT 7 03/16/2019 1450   ALT 20 03/27/2017 1101   ALT 22 07/30/2016 0750   BILITOT 0.3 03/16/2019 1450   BILITOT 0.54 07/30/2016 0750       Impression and Plan: Carla Mills is a very pleasant 48 yo caucasian female with recurrent colon cancer - HER2 (-)/KRAS (+)/MSI stable/MMR normal/BRAF -.   Hopefully, we will be able to find a genetic mutation that we can target.  It would be really really nice if her tumor recurrence was BRAF mutated.  It would also be nice if it was HER-2 positive.  We will try her back on FOLFOXIRI.  This is really worked for her in the past.  She has not had real chemotherapy for over a year.  We will try to get started next week.  We will do 4 cycles.  We will then rescan her.  The scans are going to have  to be done at [facility] for the best comparison.  I spent about 45 minutes with she and her husband.  As always it is somewhat fun talking to them.  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  I really think that we can still be aggressive with Carla Mills because of her  excellent performance status.  Volanda Napoleon, MD 12/22/20204:41 PM

## 2019-03-16 NOTE — Patient Instructions (Signed)

## 2019-03-17 LAB — CEA (IN HOUSE-CHCC): CEA (CHCC-In House): 2.95 ng/mL (ref 0.00–5.00)

## 2019-03-23 ENCOUNTER — Inpatient Hospital Stay: Payer: BC Managed Care – PPO

## 2019-03-23 ENCOUNTER — Other Ambulatory Visit: Payer: Self-pay

## 2019-03-23 VITALS — BP 119/91 | HR 99 | Temp 97.8°F | Resp 17

## 2019-03-23 DIAGNOSIS — C187 Malignant neoplasm of sigmoid colon: Secondary | ICD-10-CM

## 2019-03-23 DIAGNOSIS — C189 Malignant neoplasm of colon, unspecified: Secondary | ICD-10-CM | POA: Diagnosis not present

## 2019-03-23 MED ORDER — PALONOSETRON HCL INJECTION 0.25 MG/5ML
INTRAVENOUS | Status: AC
  Filled 2019-03-23: qty 5

## 2019-03-23 MED ORDER — SODIUM CHLORIDE 0.9% FLUSH
10.0000 mL | INTRAVENOUS | Status: DC | PRN
  Filled 2019-03-23: qty 10

## 2019-03-23 MED ORDER — LEUCOVORIN CALCIUM INJECTION 350 MG
200.0000 mg/m2 | Freq: Once | INTRAVENOUS | Status: AC
  Administered 2019-03-23: 12:00:00 368 mg via INTRAVENOUS
  Filled 2019-03-23: qty 18.4

## 2019-03-23 MED ORDER — PROCHLORPERAZINE MALEATE 10 MG PO TABS: 10.0000 mg | ORAL_TABLET | Freq: Four times a day (QID) | ORAL | 1 refills | Status: AC | PRN

## 2019-03-23 MED ORDER — SODIUM CHLORIDE 0.9 % IV SOLN
2880.0000 mg/m2 | INTRAVENOUS | Status: DC
  Administered 2019-03-23: 5300 mg via INTRAVENOUS
  Filled 2019-03-23: qty 106

## 2019-03-23 MED ORDER — LORAZEPAM 0.5 MG PO TABS: 0.5000 mg | ORAL_TABLET | Freq: Four times a day (QID) | ORAL | 0 refills | Status: DC | PRN

## 2019-03-23 MED ORDER — DEXAMETHASONE SODIUM PHOSPHATE 10 MG/ML IJ SOLN
10.0000 mg | Freq: Once | INTRAMUSCULAR | Status: AC
  Administered 2019-03-23: 09:00:00 10 mg via INTRAVENOUS

## 2019-03-23 MED ORDER — SODIUM CHLORIDE 0.9 % IV SOLN
150.0000 mg | Freq: Once | INTRAVENOUS | Status: AC
  Administered 2019-03-23: 10:00:00 150 mg via INTRAVENOUS
  Filled 2019-03-23: qty 5

## 2019-03-23 MED ORDER — HEPARIN SOD (PORK) LOCK FLUSH 100 UNIT/ML IV SOLN
500.0000 [IU] | Freq: Once | INTRAVENOUS | Status: DC | PRN
  Filled 2019-03-23: qty 5

## 2019-03-23 MED ORDER — ONDANSETRON HCL 8 MG PO TABS: 8.0000 mg | ORAL_TABLET | Freq: Two times a day (BID) | ORAL | 1 refills | Status: AC | PRN

## 2019-03-23 MED ORDER — OXALIPLATIN CHEMO INJECTION 100 MG/20ML
76.5000 mg/m2 | Freq: Once | INTRAVENOUS | Status: AC
  Administered 2019-03-23: 12:00:00 140 mg via INTRAVENOUS
  Filled 2019-03-23: qty 20

## 2019-03-23 MED ORDER — HYDROCODONE-ACETAMINOPHEN 5-325 MG PO TABS
ORAL_TABLET | ORAL | Status: AC
  Filled 2019-03-23: qty 1

## 2019-03-23 MED ORDER — ATROPINE SULFATE 1 MG/ML IJ SOLN: 0.5000 mg | Freq: Once | INTRAMUSCULAR | Status: DC | PRN

## 2019-03-23 MED ORDER — SODIUM CHLORIDE 0.9 % IV SOLN
132.0000 mg/m2 | Freq: Once | INTRAVENOUS | Status: AC
  Administered 2019-03-23: 11:00:00 240 mg via INTRAVENOUS
  Filled 2019-03-23: qty 10

## 2019-03-23 MED ORDER — HYDROCODONE-ACETAMINOPHEN 5-325 MG PO TABS
1.0000 | ORAL_TABLET | Freq: Once | ORAL | Status: AC
  Administered 2019-03-23: 09:00:00 1 via ORAL

## 2019-03-23 MED ORDER — LOPERAMIDE HCL 2 MG PO TABS: ORAL_TABLET | ORAL | 1 refills | Status: DC

## 2019-03-23 MED ORDER — DEXAMETHASONE 4 MG PO TABS: 8.0000 mg | ORAL_TABLET | Freq: Every day | ORAL | 5 refills | Status: DC

## 2019-03-23 MED ORDER — DEXAMETHASONE SODIUM PHOSPHATE 10 MG/ML IJ SOLN
INTRAMUSCULAR | Status: AC
  Filled 2019-03-23: qty 1

## 2019-03-23 MED ORDER — DEXTROSE 5 % IV SOLN
Freq: Once | INTRAVENOUS | Status: AC
  Filled 2019-03-23: qty 250

## 2019-03-23 MED ORDER — PALONOSETRON HCL INJECTION 0.25 MG/5ML
0.2500 mg | Freq: Once | INTRAVENOUS | Status: AC
  Administered 2019-03-23: 09:00:00 0.25 mg via INTRAVENOUS

## 2019-03-23 MED ORDER — SODIUM CHLORIDE 0.9 % IV SOLN: 10.0000 mg | Freq: Once | INTRAVENOUS | Status: DC

## 2019-03-23 MED ORDER — LIDOCAINE-PRILOCAINE 2.5-2.5 % EX CREA: TOPICAL_CREAM | CUTANEOUS | 3 refills | Status: DC

## 2019-03-23 MED ORDER — HYDROCODONE-ACETAMINOPHEN 7.5-325 MG PO TABS: 1.0000 | ORAL_TABLET | Freq: Four times a day (QID) | ORAL | 0 refills | Status: DC | PRN

## 2019-03-23 MED FILL — HYDROCODON-APAP 7.5-325: 7.5-325 | 15 days supply | Qty: 60 | Fill #0

## 2019-03-23 MED FILL — LIDOCAINE-PRILOCAINE CREAM: 2.5-2.5 | 30 days supply | Qty: 30 | Fill #0

## 2019-03-23 MED FILL — ANTI-DIARRHEAL 2 MG TABS: 2 | 6 days supply | Qty: 48 | Fill #0

## 2019-03-23 MED FILL — PROCHLORPERAZINE 10 MG TAB: 10 | 8 days supply | Qty: 30 | Fill #0

## 2019-03-23 MED FILL — DEXAMETHASONE 4 MG TABLET: 4 | 4 days supply | Qty: 8 | Fill #0

## 2019-03-23 MED FILL — ONDANSETRON HCL 8 MG TABLET: 8 | 15 days supply | Qty: 30 | Fill #0

## 2019-03-23 NOTE — Patient Instructions (Signed)
[place] [facility] Discharge Instructions for Patients Receiving Chemotherapy  Today you received the following chemotherapy agents Camptosar, Iriontecan, Oxaliplatin, 5FU.  To help prevent nausea and vomiting after your treatment, we encourage you to take your nausea medication as indicated by your MD.   If you develop nausea and vomiting that is not controlled by your nausea medication, call the clinic.   BELOW ARE SYMPTOMS THAT SHOULD BE REPORTED IMMEDIATELY:  *FEVER GREATER THAN 100.5 F  *CHILLS WITH OR WITHOUT FEVER  NAUSEA AND VOMITING THAT IS NOT CONTROLLED WITH YOUR NAUSEA MEDICATION  *UNUSUAL SHORTNESS OF BREATH  *UNUSUAL BRUISING OR BLEEDING  TENDERNESS IN MOUTH AND THROAT WITH OR WITHOUT PRESENCE OF ULCERS  *URINARY PROBLEMS  *BOWEL PROBLEMS  UNUSUAL RASH Items with * indicate a potential emergency and should be followed up as soon as possible.  Feel free to call the clinic should you have any questions or concerns. The clinic phone number is [phone].  Please show the [place] at check-in to the Emergency Department and triage nurse.

## 2019-03-25 ENCOUNTER — Other Ambulatory Visit: Payer: Self-pay

## 2019-03-25 ENCOUNTER — Inpatient Hospital Stay: Payer: BC Managed Care – PPO

## 2019-03-25 VITALS — BP 114/73 | HR 79 | Temp 98.4°F | Resp 17

## 2019-03-25 DIAGNOSIS — C189 Malignant neoplasm of colon, unspecified: Secondary | ICD-10-CM

## 2019-03-25 DIAGNOSIS — C772 Secondary and unspecified malignant neoplasm of intra-abdominal lymph nodes: Secondary | ICD-10-CM

## 2019-03-25 DIAGNOSIS — C187 Malignant neoplasm of sigmoid colon: Secondary | ICD-10-CM

## 2019-03-25 MED ORDER — SODIUM CHLORIDE 0.9% FLUSH
10.0000 mL | INTRAVENOUS | Status: DC | PRN
  Administered 2019-03-25: 14:00:00 10 mL
  Filled 2019-03-25: qty 10

## 2019-03-25 MED ORDER — HEPARIN SOD (PORK) LOCK FLUSH 100 UNIT/ML IV SOLN
500.0000 [IU] | Freq: Once | INTRAVENOUS | Status: AC | PRN
  Administered 2019-03-25: 14:00:00 500 [IU]
  Filled 2019-03-25: qty 5

## 2019-03-25 NOTE — Patient Instructions (Signed)

## 2019-03-26 DIAGNOSIS — D500 Iron deficiency anemia secondary to blood loss (chronic): Secondary | ICD-10-CM | POA: Insufficient documentation

## 2019-03-26 DIAGNOSIS — C189 Malignant neoplasm of colon, unspecified: Secondary | ICD-10-CM | POA: Insufficient documentation

## 2019-03-26 DIAGNOSIS — Z5111 Encounter for antineoplastic chemotherapy: Secondary | ICD-10-CM | POA: Insufficient documentation

## 2019-03-29 ENCOUNTER — Other Ambulatory Visit: Payer: Self-pay | Admitting: *Deleted

## 2019-03-29 ENCOUNTER — Inpatient Hospital Stay: Payer: BC Managed Care – PPO | Attending: Radiation Oncology

## 2019-03-29 ENCOUNTER — Other Ambulatory Visit: Payer: Self-pay

## 2019-03-29 ENCOUNTER — Telehealth: Payer: Self-pay | Admitting: *Deleted

## 2019-03-29 VITALS — BP 106/72 | HR 84 | Temp 97.9°F | Resp 18

## 2019-03-29 DIAGNOSIS — D500 Iron deficiency anemia secondary to blood loss (chronic): Secondary | ICD-10-CM | POA: Diagnosis not present

## 2019-03-29 DIAGNOSIS — E860 Dehydration: Secondary | ICD-10-CM

## 2019-03-29 DIAGNOSIS — C189 Malignant neoplasm of colon, unspecified: Secondary | ICD-10-CM | POA: Diagnosis present

## 2019-03-29 DIAGNOSIS — Z5111 Encounter for antineoplastic chemotherapy: Secondary | ICD-10-CM | POA: Diagnosis present

## 2019-03-29 MED ORDER — SODIUM CHLORIDE 0.9 % IV SOLN
Freq: Once | INTRAVENOUS | Status: DC
  Filled 2019-03-29: qty 250

## 2019-03-29 MED ORDER — LORAZEPAM 2 MG/ML IJ SOLN
INTRAMUSCULAR | Status: AC
  Filled 2019-03-29: qty 1

## 2019-03-29 MED ORDER — LORAZEPAM 2 MG/ML IJ SOLN: 1.0000 mg | Freq: Once | INTRAMUSCULAR | Status: DC

## 2019-03-29 MED ORDER — HEPARIN SOD (PORK) LOCK FLUSH 100 UNIT/ML IV SOLN
500.0000 [IU] | Freq: Once | INTRAVENOUS | Status: AC
  Administered 2019-03-29: 14:00:00 500 [IU] via INTRAVENOUS
  Filled 2019-03-29: qty 5

## 2019-03-29 MED ORDER — SODIUM CHLORIDE 0.9 % IV SOLN
1000.0000 mL | INTRAVENOUS | Status: AC
  Administered 2019-03-29: 12:00:00 1000 mL via INTRAVENOUS
  Filled 2019-03-29: qty 1000

## 2019-03-29 MED ORDER — SODIUM CHLORIDE 0.9% FLUSH
10.0000 mL | INTRAVENOUS | Status: DC | PRN
  Administered 2019-03-29: 14:00:00 10 mL via INTRAVENOUS
  Filled 2019-03-29: qty 10

## 2019-03-29 MED ORDER — LORAZEPAM 2 MG/ML IJ SOLN
1.0000 mg | Freq: Once | INTRAMUSCULAR | Status: AC
  Administered 2019-03-29: 12:00:00 1 mg via INTRAVENOUS

## 2019-03-29 NOTE — Telephone Encounter (Signed)
Patient called stating that she has had a rough weekend.  Stomach cramping, lots of diarrhea, and not able to eat.  Appt made for today for fluids

## 2019-03-29 NOTE — Progress Notes (Signed)
normal

## 2019-03-29 NOTE — Patient Instructions (Signed)

## 2019-03-31 ENCOUNTER — Other Ambulatory Visit: Payer: BC Managed Care – PPO | Admitting: Hematology & Oncology

## 2019-03-31 ENCOUNTER — Other Ambulatory Visit: Payer: Self-pay | Admitting: *Deleted

## 2019-03-31 ENCOUNTER — Other Ambulatory Visit: Payer: Self-pay | Admitting: Hematology & Oncology

## 2019-03-31 ENCOUNTER — Other Ambulatory Visit: Payer: Self-pay

## 2019-03-31 ENCOUNTER — Inpatient Hospital Stay: Payer: BC Managed Care – PPO

## 2019-03-31 ENCOUNTER — Inpatient Hospital Stay (HOSPITAL_BASED_OUTPATIENT_CLINIC_OR_DEPARTMENT_OTHER): Payer: BC Managed Care – PPO | Admitting: Hematology & Oncology

## 2019-03-31 ENCOUNTER — Ambulatory Visit (HOSPITAL_BASED_OUTPATIENT_CLINIC_OR_DEPARTMENT_OTHER)
Admission: RE | Admit: 2019-03-31 | Discharge: 2019-03-31 | Disposition: A | Discharge status: Home / Self Care | Payer: BC Managed Care – PPO | Source: Ambulatory Visit | Attending: Hematology & Oncology | Admitting: Hematology & Oncology

## 2019-03-31 VITALS — BP 97/59 | HR 81 | Temp 97.9°F | Resp 17

## 2019-03-31 DIAGNOSIS — C189 Malignant neoplasm of colon, unspecified: Secondary | ICD-10-CM | POA: Diagnosis not present

## 2019-03-31 DIAGNOSIS — D500 Iron deficiency anemia secondary to blood loss (chronic): Secondary | ICD-10-CM

## 2019-03-31 DIAGNOSIS — D62 Acute posthemorrhagic anemia: Secondary | ICD-10-CM

## 2019-03-31 DIAGNOSIS — R112 Nausea with vomiting, unspecified: Secondary | ICD-10-CM

## 2019-03-31 DIAGNOSIS — E860 Dehydration: Secondary | ICD-10-CM

## 2019-03-31 LAB — COMPREHENSIVE METABOLIC PANEL
ALT: 9 U/L (ref 0–44)
AST: 9 U/L — ABNORMAL LOW (ref 15–41)
Albumin: 3.7 g/dL (ref 3.5–5.0)
Alkaline Phosphatase: 57 U/L (ref 38–126)
Anion gap: 9 (ref 5–15)
BUN: 17 mg/dL (ref 6–20)
CO2: 25 mmol/L (ref 22–32)
Calcium: 8.9 mg/dL (ref 8.9–10.3)
Chloride: 102 mmol/L (ref 98–111)
Creatinine, Ser: 0.91 mg/dL (ref 0.44–1.00)
GFR calc Af Amer: >60 mL/min (ref >60)
GFR calc non Af Amer: >60 mL/min (ref >60)
Glucose, Bld: 87 mg/dL (ref 70–99)
Potassium: 3.4 mmol/L — ABNORMAL LOW (ref 3.5–5.1)
Sodium: 136 mmol/L (ref 135–145)
Total Bilirubin: 0.4 mg/dL (ref 0.3–1.2)
Total Protein: 6.4 g/dL — ABNORMAL LOW (ref 6.5–8.1)

## 2019-03-31 LAB — CBC WITH DIFFERENTIAL (CANCER CENTER ONLY)
Abs Immature Granulocytes: 0.02 10*3/uL (ref 0.00–0.07)
Basophils Absolute: 0 10*3/uL (ref 0.0–0.1)
Basophils Relative: 0 %
Eosinophils Absolute: 0.8 10*3/uL — ABNORMAL HIGH (ref 0.0–0.5)
Eosinophils Relative: 12 %
HCT: 34.2 % — ABNORMAL LOW (ref 36.0–46.0)
Hemoglobin: 11.1 g/dL — ABNORMAL LOW (ref 12.0–15.0)
Immature Granulocytes: 0 %
Lymphocytes Relative: 17 %
Lymphs Abs: 1.1 10*3/uL (ref 0.7–4.0)
MCH: 27.5 pg (ref 26.0–34.0)
MCHC: 32.5 g/dL (ref 30.0–36.0)
MCV: 84.9 fL (ref 80.0–100.0)
Monocytes Absolute: 0.6 10*3/uL (ref 0.1–1.0)
Monocytes Relative: 9 %
Neutro Abs: 4.2 10*3/uL (ref 1.7–7.7)
Neutrophils Relative %: 62 %
Platelet Count: 366 10*3/uL (ref 150–400)
RBC: 4.03 MIL/uL (ref 3.87–5.11)
RDW: 13.2 % (ref 11.5–15.5)
WBC Count: 6.7 10*3/uL (ref 4.0–10.5)
nRBC: 0 % (ref 0.0–0.2)

## 2019-03-31 MED ORDER — PANTOPRAZOLE SODIUM 40 MG PO TBEC: 40.0000 mg | DELAYED_RELEASE_TABLET | Freq: Every day | ORAL | 6 refills | Status: DC

## 2019-03-31 MED ORDER — SODIUM CHLORIDE 0.9% FLUSH
10.0000 mL | Freq: Once | INTRAVENOUS | Status: AC | PRN
  Administered 2019-03-31: 10 mL
  Filled 2019-03-31: qty 10

## 2019-03-31 MED ORDER — FAMOTIDINE IN NACL 20-0.9 MG/50ML-% IV SOLN: 40.0000 mg | Freq: Once | INTRAVENOUS | Status: DC

## 2019-03-31 MED ORDER — HEPARIN SOD (PORK) LOCK FLUSH 100 UNIT/ML IV SOLN
500.0000 [IU] | Freq: Once | INTRAVENOUS | Status: AC | PRN
  Administered 2019-03-31: 500 [IU]
  Filled 2019-03-31: qty 5

## 2019-03-31 MED ORDER — SODIUM CHLORIDE 0.9 % IV SOLN
1000.0000 mL | INTRAVENOUS | Status: AC
  Administered 2019-03-31: 1000 mL via INTRAVENOUS
  Filled 2019-03-31 (×2): qty 1000

## 2019-03-31 MED ORDER — LORAZEPAM 2 MG/ML IJ SOLN
0.5000 mg | Freq: Once | INTRAMUSCULAR | Status: AC
  Administered 2019-03-31: 0.5 mg via INTRAVENOUS

## 2019-03-31 MED ORDER — METOCLOPRAMIDE HCL 10 MG PO TABS: 20.0000 mg | ORAL_TABLET | Freq: Four times a day (QID) | ORAL | 4 refills | Status: DC

## 2019-03-31 MED ORDER — METOCLOPRAMIDE HCL 5 MG/ML IJ SOLN
20.0000 mg | Freq: Once | INTRAVENOUS | Status: AC
  Administered 2019-03-31: 20 mg via INTRAVENOUS
  Filled 2019-03-31: qty 4

## 2019-03-31 MED ORDER — SODIUM CHLORIDE 0.9 % IV SOLN
40.0000 mg | Freq: Once | INTRAVENOUS | Status: AC
  Administered 2019-03-31: 40 mg via INTRAVENOUS
  Filled 2019-03-31: qty 4

## 2019-03-31 MED ORDER — LORAZEPAM 2 MG/ML IJ SOLN
INTRAMUSCULAR | Status: AC
  Filled 2019-03-31: qty 1

## 2019-03-31 MED FILL — PANTOPRAZOLE SOD DR 40 MG T: 40 | 30 days supply | Qty: 30 | Fill #0

## 2019-03-31 MED FILL — METOCLOPRAMIDE 10 MG TABLET: 10 | 15 days supply | Qty: 120 | Fill #0

## 2019-03-31 NOTE — Patient Instructions (Signed)

## 2019-04-05 ENCOUNTER — Telehealth: Payer: Self-pay | Admitting: *Deleted

## 2019-04-05 MED FILL — DEXAMETHASONE 4 MG TABLET: 4 | 4 days supply | Qty: 8 | Fill #1

## 2019-04-05 NOTE — Telephone Encounter (Signed)
Call placed to check on pt.'s status per order of Dr. Marin Olp.  Pt states that she is "much better and is eating and drinking without difficulty."  Pt states that she is taking Reglan one tablet four times daily instead of two times daily d/t two tablets was increasing her HR to the 120's. Dr. Marin Olp notified. Pt appreciative of call.

## 2019-04-06 ENCOUNTER — Inpatient Hospital Stay (HOSPITAL_BASED_OUTPATIENT_CLINIC_OR_DEPARTMENT_OTHER): Payer: BC Managed Care – PPO | Admitting: Hematology & Oncology

## 2019-04-06 ENCOUNTER — Inpatient Hospital Stay: Payer: BC Managed Care – PPO | Admitting: Nutrition

## 2019-04-06 ENCOUNTER — Encounter: Payer: Self-pay | Admitting: Hematology & Oncology

## 2019-04-06 ENCOUNTER — Inpatient Hospital Stay: Payer: BC Managed Care – PPO

## 2019-04-06 ENCOUNTER — Other Ambulatory Visit: Payer: Self-pay | Admitting: *Deleted

## 2019-04-06 ENCOUNTER — Other Ambulatory Visit: Payer: Self-pay

## 2019-04-06 VITALS — BP 100/67 | HR 94 | Temp 97.1°F | Resp 18 | Wt 159.1 lb

## 2019-04-06 DIAGNOSIS — C189 Malignant neoplasm of colon, unspecified: Secondary | ICD-10-CM

## 2019-04-06 DIAGNOSIS — C187 Malignant neoplasm of sigmoid colon: Secondary | ICD-10-CM

## 2019-04-06 LAB — CMP (CANCER CENTER ONLY)
ALT: 12 U/L (ref 0–44)
AST: 9 U/L — ABNORMAL LOW (ref 15–41)
Albumin: 3.3 g/dL — ABNORMAL LOW (ref 3.5–5.0)
Alkaline Phosphatase: 76 U/L (ref 38–126)
Anion gap: 10 (ref 5–15)
BUN: 11 mg/dL (ref 6–20)
CO2: 25 mmol/L (ref 22–32)
Calcium: 8.7 mg/dL — ABNORMAL LOW (ref 8.9–10.3)
Chloride: 104 mmol/L (ref 98–111)
Creatinine: 0.9 mg/dL (ref 0.44–1.00)
GFR, Est AFR Am: >60 mL/min (ref >60)
GFR, Estimated: >60 mL/min (ref >60)
Glucose, Bld: 119 mg/dL — ABNORMAL HIGH (ref 70–99)
Potassium: 3.1 mmol/L — ABNORMAL LOW (ref 3.5–5.1)
Sodium: 139 mmol/L (ref 135–145)
Total Bilirubin: 0.3 mg/dL (ref 0.3–1.2)
Total Protein: 5.9 g/dL — ABNORMAL LOW (ref 6.5–8.1)

## 2019-04-06 LAB — FERRITIN: Ferritin: 597 ng/mL — ABNORMAL HIGH (ref 11–307)

## 2019-04-06 LAB — CBC WITH DIFFERENTIAL (CANCER CENTER ONLY)
Abs Immature Granulocytes: 0.03 10*3/uL (ref 0.00–0.07)
Basophils Absolute: 0 10*3/uL (ref 0.0–0.1)
Basophils Relative: 1 %
Eosinophils Absolute: 0.6 10*3/uL — ABNORMAL HIGH (ref 0.0–0.5)
Eosinophils Relative: 10 %
HCT: 28.8 % — ABNORMAL LOW (ref 36.0–46.0)
Hemoglobin: 9.4 g/dL — ABNORMAL LOW (ref 12.0–15.0)
Immature Granulocytes: 1 %
Lymphocytes Relative: 18 %
Lymphs Abs: 1 10*3/uL (ref 0.7–4.0)
MCH: 27.6 pg (ref 26.0–34.0)
MCHC: 32.6 g/dL (ref 30.0–36.0)
MCV: 84.5 fL (ref 80.0–100.0)
Monocytes Absolute: 0.6 10*3/uL (ref 0.1–1.0)
Monocytes Relative: 11 %
Neutro Abs: 3.4 10*3/uL (ref 1.7–7.7)
Neutrophils Relative %: 59 %
Platelet Count: 334 10*3/uL (ref 150–400)
RBC: 3.41 MIL/uL — ABNORMAL LOW (ref 3.87–5.11)
RDW: 13.5 % (ref 11.5–15.5)
WBC Count: 5.7 10*3/uL (ref 4.0–10.5)
nRBC: 0 % (ref 0.0–0.2)

## 2019-04-06 LAB — IRON AND TIBC
Iron: 20 ug/dL — ABNORMAL LOW (ref 41–142)
Saturation Ratios: 12 % — ABNORMAL LOW (ref 21–57)
TIBC: 171 ug/dL — ABNORMAL LOW (ref 236–444)
UIBC: 151 ug/dL (ref 120–384)

## 2019-04-06 MED ORDER — DEXTROSE 5 % IV SOLN
Freq: Once | INTRAVENOUS | Status: AC
  Filled 2019-04-06: qty 250

## 2019-04-06 MED ORDER — LEUCOVORIN CALCIUM INJECTION 350 MG
200.0000 mg/m2 | Freq: Once | INTRAVENOUS | Status: AC
  Administered 2019-04-06: 12:00:00 368 mg via INTRAVENOUS
  Filled 2019-04-06: qty 18.4

## 2019-04-06 MED ORDER — PALONOSETRON HCL INJECTION 0.25 MG/5ML
0.2500 mg | Freq: Once | INTRAVENOUS | Status: AC
  Administered 2019-04-06: 10:00:00 0.25 mg via INTRAVENOUS

## 2019-04-06 MED ORDER — SODIUM CHLORIDE 0.9 % IV SOLN
132.0000 mg/m2 | Freq: Once | INTRAVENOUS | Status: AC
  Administered 2019-04-06: 240 mg via INTRAVENOUS
  Filled 2019-04-06: qty 10

## 2019-04-06 MED ORDER — DEXTROSE 5 % IV SOLN
Freq: Once | INTRAVENOUS | Status: DC
  Filled 2019-04-06: qty 250

## 2019-04-06 MED ORDER — SODIUM CHLORIDE 0.9 % IV SOLN
2880.0000 mg/m2 | INTRAVENOUS | Status: DC
  Administered 2019-04-06: 5300 mg via INTRAVENOUS
  Filled 2019-04-06: qty 106

## 2019-04-06 MED ORDER — ATROPINE SULFATE 1 MG/ML IJ SOLN
INTRAMUSCULAR | Status: AC
  Filled 2019-04-06: qty 1

## 2019-04-06 MED ORDER — PB-HYOSCY-ATROPINE-SCOPOLAMINE 16.2 MG/5ML PO ELIX: 5.0000 mL | ORAL_SOLUTION | Freq: Four times a day (QID) | ORAL | 2 refills | Status: DC | PRN

## 2019-04-06 MED ORDER — OXALIPLATIN CHEMO INJECTION 100 MG/20ML
76.5000 mg/m2 | Freq: Once | INTRAVENOUS | Status: AC
  Administered 2019-04-06: 140 mg via INTRAVENOUS
  Filled 2019-04-06: qty 10

## 2019-04-06 MED ORDER — LORAZEPAM 1 MG PO TABS: 1.0000 mg | ORAL_TABLET | Freq: Three times a day (TID) | ORAL | 0 refills | Status: DC

## 2019-04-06 MED ORDER — SODIUM CHLORIDE 0.9 % IV SOLN
150.0000 mg | Freq: Once | INTRAVENOUS | Status: AC
  Administered 2019-04-06: 10:00:00 150 mg via INTRAVENOUS
  Filled 2019-04-06: qty 5

## 2019-04-06 MED ORDER — ATROPINE SULFATE 1 MG/ML IJ SOLN
0.5000 mg | Freq: Once | INTRAMUSCULAR | Status: AC
  Administered 2019-04-06: 11:00:00 0.5 mg via INTRAVENOUS

## 2019-04-06 MED ORDER — DEXAMETHASONE SODIUM PHOSPHATE 10 MG/ML IJ SOLN
INTRAMUSCULAR | Status: AC
  Filled 2019-04-06: qty 1

## 2019-04-06 MED ORDER — DEXAMETHASONE SODIUM PHOSPHATE 10 MG/ML IJ SOLN
10.0000 mg | Freq: Once | INTRAMUSCULAR | Status: AC
  Administered 2019-04-06: 10 mg via INTRAVENOUS

## 2019-04-06 MED ORDER — PALONOSETRON HCL INJECTION 0.25 MG/5ML
INTRAVENOUS | Status: AC
  Filled 2019-04-06: qty 5

## 2019-04-06 MED FILL — LORAZEPAM 1 MG TABS: 1 | 10 days supply | Qty: 30 | Fill #0

## 2019-04-06 NOTE — Patient Instructions (Signed)
Implanted Port Insertion, Care After °This sheet gives you information about how to care for yourself after your procedure. Your health care provider may also give you more specific instructions. If you have problems or questions, contact your health care provider. °What can I expect after the procedure? °After the procedure, it is common to have: °· Discomfort at the port insertion site. °· Bruising on the skin over the port. This should improve over 3-4 days. °Follow these instructions at home: °Port care °· After your port is placed, you will get a manufacturer's information card. The card has information about your port. Keep this card with you at all times. °· Take care of the port as told by your health care provider. Ask your health care provider if you or a family member can get training for taking care of the port at home. A home health care nurse may also take care of the port. °· Make sure to remember what type of port you have. °Incision care ° °  ° °· Follow instructions from your health care provider about how to take care of your port insertion site. Make sure you: °? Wash your hands with soap and water before and after you change your bandage (dressing). If soap and water are not available, use hand sanitizer. °? Change your dressing as told by your health care provider. °? Leave stitches (sutures), skin glue, or adhesive strips in place. These skin closures may need to stay in place for 2 weeks or longer. If adhesive strip edges start to loosen and curl up, you may trim the loose edges. Do not remove adhesive strips completely unless your health care provider tells you to do that. °· Check your port insertion site every day for signs of infection. Check for: °? Redness, swelling, or pain. °? Fluid or blood. °? Warmth. °? Pus or a bad smell. °Activity °· Return to your normal activities as told by your health care provider. Ask your health care provider what activities are safe for you. °· Do not  lift anything that is heavier than 10 lb (4.5 kg), or the limit that you are told, until your health care provider says that it is safe. °General instructions °· Take over-the-counter and prescription medicines only as told by your health care provider. °· Do not take baths, swim, or use a hot tub until your health care provider approves. Ask your health care provider if you may take showers. You may only be allowed to take sponge baths. °· Do not drive for 24 hours if you were given a sedative during your procedure. °· Wear a medical alert bracelet in case of an emergency. This will tell any health care providers that you have a port. °· Keep all follow-up visits as told by your health care provider. This is important. °Contact a health care provider if: °· You cannot flush your port with saline as directed, or you cannot draw blood from the port. °· You have a fever or chills. °· You have redness, swelling, or pain around your port insertion site. °· You have fluid or blood coming from your port insertion site. °· Your port insertion site feels warm to the touch. °· You have pus or a bad smell coming from the port insertion site. °Get help right away if: °· You have chest pain or shortness of breath. °· You have bleeding from your port that you cannot control. °Summary °· Take care of the port as told by your health   care provider. Keep the manufacturer's information card with you at all times. °· Change your dressing as told by your health care provider. °· Contact a health care provider if you have a fever or chills or if you have redness, swelling, or pain around your port insertion site. °· Keep all follow-up visits as told by your health care provider. °This information is not intended to replace advice given to you by your health care provider. Make sure you discuss any questions you have with your health care provider. °Document Revised: 10/07/2017 Document Reviewed: 10/07/2017 °Elsevier Patient Education ©  2020 [employer] ° °

## 2019-04-06 NOTE — Patient Instructions (Signed)
[facility] Discharge Instructions for Patients Receiving Chemotherapy  Today you received the following chemotherapy agents Irinotecan, 5FU, Oxaliplatin  To help prevent nausea and vomiting after your treatment, we encourage you to take your nausea medication    If you develop nausea and vomiting that is not controlled by your nausea medication, call the clinic.   BELOW ARE SYMPTOMS THAT SHOULD BE REPORTED IMMEDIATELY:  *FEVER GREATER THAN 100.5 F  *CHILLS WITH OR WITHOUT FEVER  NAUSEA AND VOMITING THAT IS NOT CONTROLLED WITH YOUR NAUSEA MEDICATION  *UNUSUAL SHORTNESS OF BREATH  *UNUSUAL BRUISING OR BLEEDING  TENDERNESS IN MOUTH AND THROAT WITH OR WITHOUT PRESENCE OF ULCERS  *URINARY PROBLEMS  *BOWEL PROBLEMS  UNUSUAL RASH Items with * indicate a potential emergency and should be followed up as soon as possible.  Feel free to call the clinic should you have any questions or concerns. The clinic phone number is [phone].  Please show the [place] at check-in to the Emergency Department and triage nurse.

## 2019-04-06 NOTE — Progress Notes (Signed)
Hematology and Oncology Follow Up Visit  Carla Mills [id number] 28-Mar-1970 49 y.o. 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  She actually looks pretty good.  After her first cycle of chemotherapy, she really had a tough time.  She had a lot of abdominal spasms.  She did not receive atropine.  I made sure that our pharmacist added atropine to her care plan.  She is not having problems with pain.  Her colostomy is working okay..  She has a nephrostomy tube out her right side.  Hopefully, at some point, this will be internalized.  I think she goes back to see Dr Crisoforo Oxford at [facility] in about a month.  She has had no fever.  She has had no bleeding.  She has had no mouth sores.  There has been no leg swelling.  She has had no rashes.  Overall, I would say that her performance status is ECOG 1  Allergies as of 04/06/2019      Reactions   Bactrim [sulfamethoxazole-trimethoprim] Swelling, Other (See Comments)   Severe generalized swelling   Capecitabine Hives, Other (See Comments)   Legs and arms, after completing therapy   Chlorhexidine Rash   Oxaprozin Hives   Day pro   Penicillins Hives, Other (See Comments)   Has patient had a PCN reaction causing immediate rash, facial/tongue/throat swelling, SOB or lightheadedness with hypotension: no Has patient had a PCN reaction causing severe rash involving mucus membranes or skin  necrosis: {no Has patient had a PCN reaction that required hospitalization no Has patient had a PCN reaction occurring within the last 10 years: no If all of the above answers are "NO", then may proceed with Cephalosporin use.      Medication List       Accurate as of April 06, 2019  8:44 AM. If you have any questions, ask your nurse or doctor.        cefpodoxime 200 MG tablet Commonly known as: VANTIN Take 200 mg by mouth 2 (two) times daily.   cetirizine 10 MG tablet Commonly known as: ZYRTEC Take 10 mg by mouth daily.   dexamethasone 4 MG tablet Commonly known as: DECADRON Take 2 tablets (8 mg total) by mouth daily. Start the day after chemotherapy for 3 days. Take with food.   doxycycline 100 MG tablet Commonly known as: VIBRA-TABS Take 1 tablet (100 mg total) by mouth 2 (two) times daily.   fluticasone 50 MCG/ACT nasal spray Commonly known as: FLONASE Place 1 spray into both nostrils 2 (two) times daily.   HYDROcodone-acetaminophen 7.5-325 MG tablet Commonly known as: NORCO Take 1 tablet by mouth every 6 (six) hours as needed for moderate pain.   ibuprofen 600 MG tablet Commonly known as: ADVIL Take 600 mg by mouth every 6 (six) hours as needed.   lidocaine-prilocaine cream Commonly known as: EMLA Apply to affected area once   loperamide 2 MG tablet Commonly known as: Imodium A-D  Take 2 at onset of diarrhea, then 1 every 2hrs until 12hr without a BM. May take 2 tab every 4hrs at bedtime. If diarrhea recurs repeat.   LORazepam 1 MG tablet Commonly known as: ATIVAN Take 1 mg by mouth every 8 (eight) hours.   LORazepam 0.5 MG tablet Commonly known as: Ativan Take 1 tablet (0.5 mg total) by mouth every 6 (six) hours as needed for anxiety.   metoCLOPramide 10 MG tablet Commonly known as: Reglan Take 2 tablets (20 mg total) by mouth 4 (four) times daily.   ondansetron 8 MG tablet Commonly known as: Zofran Take 1 tablet (8 mg total) by mouth 2 (two)  times daily as needed. Start on day 3 after chemotherapy.   pantoprazole 40 MG tablet Commonly known as: Protonix Take 1 tablet (40 mg total) by mouth daily.   prochlorperazine 10 MG tablet Commonly known as: COMPAZINE Take 1 tablet (10 mg total) by mouth every 6 (six) hours as needed (Nausea or vomiting).   Urinary Leg Bag Kit 1 Units by Misc.(Non-Drug; Combo Route) route once a week.   Bard Extension Tubing/Connect Misc 1 Units by [employer].(Non-Drug; Combo Route) route twice a week. Urinary leg bag extension tubing       Allergies:  Allergies  Allergen Reactions  . Bactrim [Sulfamethoxazole-Trimethoprim] Swelling and Other (See Comments)    Severe generalized swelling  . Capecitabine Hives and Other (See Comments)    Legs and arms, after completing therapy  . Chlorhexidine Rash  . Oxaprozin Hives    Day pro  . Penicillins Hives and Other (See Comments)    Has patient had a PCN reaction causing immediate rash, facial/tongue/throat swelling, SOB or lightheadedness with hypotension: no Has patient had a PCN reaction causing severe rash involving mucus membranes or skin necrosis: {no Has patient had a PCN reaction that required hospitalization no Has patient had a PCN reaction occurring within the last 10 years: no If all of the above answers are "NO", then may proceed with Cephalosporin use.    Past Medical History, Surgical history, Social history, and Family History were reviewed and updated.  Review of Systems: Review of Systems  Constitutional: Negative.   HENT: Negative.   Eyes: Negative.   Respiratory: Negative.   Cardiovascular: Negative.   Gastrointestinal: Negative.   Genitourinary: Negative.   Musculoskeletal: Negative.   Skin: Negative.   Neurological: Negative.   Endo/Heme/Allergies: Negative.   Psychiatric/Behavioral: Negative.       Physical Exam:  weight is 159 lb 2 oz (72.2 kg). Her temporal temperature is 97.1 F (36.2 C) (abnormal). Her blood  pressure is 100/67 and her pulse is 94. Her respiration is 18 and oxygen saturation is 99%.   Wt Readings from Last 3 Encounters:  04/06/19 159 lb 2 oz (72.2 kg)  04/06/19 159 lb 3.2 oz (72.2 kg)  03/16/19 162 lb (73.5 kg)    21month 24 scenario 316 morning man Eric over 45 her vital signs show a temperature 97.6.  Pulse 76.  Blood pressure 114/75.  Physical Exam Vitals reviewed.  HENT:     Head: Normocephalic and atraumatic.  Eyes:     Pupils: Pupils are equal, round, and reactive to light.  Cardiovascular:     Rate and Rhythm: Normal rate and regular rhythm.     Heart sounds: Normal heart sounds.  Pulmonary:     Effort: Pulmonary effort is normal.     Breath sounds: Normal breath sounds.  Abdominal:     General: Bowel  sounds are normal.     Palpations: Abdomen is soft.     Comments: Abdominal exam shows laparotomy scars that are well-healed.  She has a colostomy in the left lower quadrant.  She has no fluid wave.  There is no guarding or rebound tenderness.  She has no palpable liver or spleen tip.  Musculoskeletal:        General: No tenderness or deformity. Normal range of motion.     Cervical back: Normal range of motion.  Lymphadenopathy:     Cervical: No cervical adenopathy.  Skin:    General: Skin is warm and dry.     Findings: No erythema or rash.  Neurological:     Mental Status: She is alert and oriented to person, place, and time.  Psychiatric:        Behavior: Behavior normal.        Thought Content: Thought content normal.        Judgment: Judgment normal.      Lab Results  Component Value Date   WBC 5.7 04/06/2019   HGB 9.4 (L) 04/06/2019   HCT 28.8 (L) 04/06/2019   MCV 84.5 04/06/2019   PLT 334 04/06/2019   Lab Results  Component Value Date   FERRITIN 652 (H) 02/02/2019   IRON 15 (L) 02/02/2019   TIBC 195 (L) 02/02/2019   UIBC 180 02/02/2019   IRONPCTSAT 8 (L) 02/02/2019   Lab Results  Component Value Date   RETICCTPCT 0.8 02/12/2015    RBC 3.41 (L) 04/06/2019   No results found for: KPAFRELGTCHN, LAMBDASER, KAPLAMBRATIO No results found for: IGGSERUM, IGA, IGMSERUM No results found for: Odetta Pink, SPEI   Chemistry      Component Value Date/Time   NA 136 03/31/2019 1329   NA 145 03/27/2017 1101   NA 141 07/30/2016 0750   K 3.4 (L) 03/31/2019 1329   K 4.5 03/27/2017 1101   K 4.2 07/30/2016 0750   CL 102 03/31/2019 1329   CL 103 03/27/2017 1101   CO2 25 03/31/2019 1329   CO2 28 03/27/2017 1101   CO2 26 07/30/2016 0750   BUN 17 03/31/2019 1329   BUN 12 03/27/2017 1101   BUN 12.1 07/30/2016 0750   CREATININE 0.91 03/31/2019 1329   CREATININE 1.14 (H) 03/16/2019 1450   CREATININE 0.9 03/27/2017 1101   CREATININE 0.7 07/30/2016 0750      Component Value Date/Time   CALCIUM 8.9 03/31/2019 1329   CALCIUM 9.6 03/27/2017 1101   CALCIUM 9.5 07/30/2016 0750   ALKPHOS 57 03/31/2019 1329   ALKPHOS 101 (H) 03/27/2017 1101   ALKPHOS 72 07/30/2016 0750   AST 9 (L) 03/31/2019 1329   AST 11 (L) 03/16/2019 1450   AST 22 07/30/2016 0750   ALT 9 03/31/2019 1329   ALT 7 03/16/2019 1450   ALT 20 03/27/2017 1101   ALT 22 07/30/2016 0750   BILITOT 0.4 03/31/2019 1329   BILITOT 0.3 03/16/2019 1450   BILITOT 0.54 07/30/2016 0750       Impression and Plan: Ms. Pickelsimer is a very pleasant 49 yo caucasian female with recurrent colon cancer - HER2 (-)/KRAS (+)/MSI stable/MMR normal/BRAF -.   Of note, on her molecular analysis, there is still no actionable mutation that we can find.  She does have a high TMB.  However, I really do not think that we will be able to utilize immunotherapy.  She does not have Lynch syndrome as she has a  stable MSI.  I know that there have been some recent trials that have looked at other combinations.  I do think she has received Stivarga.  She is not yet had Lonsurf.  Hopefully, we will see that she is responding.  She will have 4 cycles of  treatment and then have her scans at [facility].  I am glad that she is not having problems with the nephrostomy tube.  We will plan to have her come back in 2 more weeks for her third cycle of treatment.     Volanda Napoleon, MD 1/12/20218:44 AM

## 2019-04-06 NOTE — Progress Notes (Signed)
49 year old female diagnosed with colon cancer in 2016 and metastatic disease in 2018. She is s/p colostomy and nephrostomy tubes. She is receiving FOLFOXFIRI.   PMH reviewed.  Medications include decadron, immodium, ativan, Protonix, Compazine, Reglan, and Zofran.  Labs include K 3.1, Glucoses 119 and Alb 3.3.  Height: 5'5". Weight:159.1 pounds. UBW: 168 pounds in May 2020. BMI:26.48.  Patient reports she has an adequate appetite and generally eats 3 meals daily. She occasionally snacks. Reports history of indigestion and acid reflux which is resolved on Protonix. Bowels are moving once a day. 5% wt loss over 8 months which is not significant. Patient has persistent nausea.  Nutrition Diagnosis: Unintended weight loss related to metastatic cancer and associated treatments as evidenced from 9 pound weight loss over 8 months.  Intervention: Educated on importance of small frequent meals and snacks consisting of high calorie, high protein foods. Reviewed between meal snack options. Recommended Carnation Breakfast Essentials between meals as tolerated. Encouraged nausea medication to minimize nausea. Provided fact sheets and contact information. Gave name and phone number for questions.  Monitoring, Evaluation, Goals: Weight maintenance or minimal wt loss.  Next Visit: Patient will call with questions.

## 2019-04-08 ENCOUNTER — Other Ambulatory Visit: Payer: Self-pay | Admitting: *Deleted

## 2019-04-08 ENCOUNTER — Other Ambulatory Visit: Payer: Self-pay

## 2019-04-08 ENCOUNTER — Inpatient Hospital Stay: Payer: BC Managed Care – PPO

## 2019-04-08 VITALS — BP 114/66 | HR 71 | Temp 98.0°F | Resp 18

## 2019-04-08 DIAGNOSIS — C189 Malignant neoplasm of colon, unspecified: Secondary | ICD-10-CM | POA: Diagnosis not present

## 2019-04-08 DIAGNOSIS — D62 Acute posthemorrhagic anemia: Secondary | ICD-10-CM

## 2019-04-08 DIAGNOSIS — C772 Secondary and unspecified malignant neoplasm of intra-abdominal lymph nodes: Secondary | ICD-10-CM

## 2019-04-08 DIAGNOSIS — C187 Malignant neoplasm of sigmoid colon: Secondary | ICD-10-CM

## 2019-04-08 DIAGNOSIS — D500 Iron deficiency anemia secondary to blood loss (chronic): Secondary | ICD-10-CM

## 2019-04-08 MED ORDER — SODIUM CHLORIDE 0.9 % IV SOLN
INTRAVENOUS | Status: DC
  Filled 2019-04-08: qty 250

## 2019-04-08 MED ORDER — HEPARIN SOD (PORK) LOCK FLUSH 100 UNIT/ML IV SOLN
500.0000 [IU] | Freq: Once | INTRAVENOUS | Status: AC | PRN
  Administered 2019-04-08: 500 [IU]
  Filled 2019-04-08: qty 5

## 2019-04-08 MED ORDER — SODIUM CHLORIDE 0.9% FLUSH
10.0000 mL | INTRAVENOUS | Status: DC | PRN
  Administered 2019-04-08: 15:00:00 10 mL
  Filled 2019-04-08: qty 10

## 2019-04-08 MED ORDER — SODIUM CHLORIDE 0.9 % IV SOLN
INTRAVENOUS | Status: DC
  Filled 2019-04-08 (×2): qty 250

## 2019-04-08 MED ORDER — SODIUM CHLORIDE 0.9 % IV SOLN
Freq: Once | INTRAVENOUS | Status: AC
  Filled 2019-04-08: qty 250

## 2019-04-08 MED ORDER — SODIUM CHLORIDE 0.9 % IV SOLN
510.0000 mg | Freq: Once | INTRAVENOUS | Status: AC
  Administered 2019-04-08: 14:00:00 510 mg via INTRAVENOUS
  Filled 2019-04-08: qty 17

## 2019-04-08 NOTE — Patient Instructions (Signed)
[place] Discharge Instructions for Patients Receiving Chemotherapy  Today you received the following chemotherapy agents Irinotecan, 5FU, Oxaliplatin  To help prevent nausea and vomiting after your treatment, we encourage you to take your nausea medication    If you develop nausea and vomiting that is not controlled by your nausea medication, call the clinic.   BELOW ARE SYMPTOMS THAT SHOULD BE REPORTED IMMEDIATELY:  *FEVER GREATER THAN 100.5 F  *CHILLS WITH OR WITHOUT FEVER  NAUSEA AND VOMITING THAT IS NOT CONTROLLED WITH YOUR NAUSEA MEDICATION  *UNUSUAL SHORTNESS OF BREATH  *UNUSUAL BRUISING OR BLEEDING  TENDERNESS IN MOUTH AND THROAT WITH OR WITHOUT PRESENCE OF ULCERS  *URINARY PROBLEMS  *BOWEL PROBLEMS  UNUSUAL RASH Items with * indicate a potential emergency and should be followed up as soon as possible.  Feel free to call the clinic should you have any questions or concerns. The clinic phone number is [phone].  Please show the [employer] at check-in to the Emergency Department and triage nurse.

## 2019-04-14 ENCOUNTER — Other Ambulatory Visit: Payer: Self-pay | Admitting: *Deleted

## 2019-04-14 ENCOUNTER — Telehealth: Payer: Self-pay | Admitting: *Deleted

## 2019-04-14 MED ORDER — LACTULOSE 20 GM/30ML PO SOLN: 15.0000 mL | Freq: Three times a day (TID) | ORAL | 2 refills | Status: DC

## 2019-04-14 MED FILL — GENERLAC 10 GM/15 ML SOLN: 10 | 11 days supply | Qty: 473 | Fill #0

## 2019-04-14 NOTE — Telephone Encounter (Signed)
Received call from patient stating that her stomach is "gurgly" and with stomach pains and has not had any output for 3 days.  Dr. Marin Olp prescribed Lactulose. Rx sent downstairs.

## 2019-04-19 ENCOUNTER — Telehealth: Payer: Self-pay | Admitting: *Deleted

## 2019-04-19 NOTE — Telephone Encounter (Signed)
Call received from patient to inform Dr. Marin Olp that she was admitted to [facility] on Thursday, 04/14/19 and would like to move her appointments out by one week.  Dr. Marin Olp notified and message sent to scheduling.

## 2019-04-20 ENCOUNTER — Inpatient Hospital Stay: Payer: BC Managed Care – PPO | Admitting: Hematology & Oncology

## 2019-04-20 ENCOUNTER — Inpatient Hospital Stay: Payer: BC Managed Care – PPO

## 2019-04-21 ENCOUNTER — Telehealth: Payer: Self-pay | Admitting: *Deleted

## 2019-04-21 NOTE — Telephone Encounter (Signed)
Received a call from Jyl Heinz MD at [facility] for Dr. Marin Olp to call him back.  Message given to Dr. Marin Olp

## 2019-04-22 ENCOUNTER — Inpatient Hospital Stay: Payer: BC Managed Care – PPO

## 2019-04-22 MED FILL — DEXAMETHASONE 4 MG TABLET: 4 | 4 days supply | Qty: 8 | Fill #2

## 2019-04-27 ENCOUNTER — Inpatient Hospital Stay: Payer: BC Managed Care – PPO

## 2019-04-27 ENCOUNTER — Inpatient Hospital Stay: Payer: BC Managed Care – PPO | Attending: Radiation Oncology

## 2019-04-27 ENCOUNTER — Encounter: Payer: Self-pay | Admitting: Hematology & Oncology

## 2019-04-27 ENCOUNTER — Other Ambulatory Visit: Payer: Self-pay

## 2019-04-27 ENCOUNTER — Inpatient Hospital Stay (HOSPITAL_BASED_OUTPATIENT_CLINIC_OR_DEPARTMENT_OTHER): Payer: BC Managed Care – PPO | Admitting: Hematology & Oncology

## 2019-04-27 VITALS — BP 109/77 | HR 93 | Temp 97.1°F | Resp 18 | Wt 155.0 lb

## 2019-04-27 DIAGNOSIS — C187 Malignant neoplasm of sigmoid colon: Secondary | ICD-10-CM

## 2019-04-27 DIAGNOSIS — C189 Malignant neoplasm of colon, unspecified: Secondary | ICD-10-CM

## 2019-04-27 DIAGNOSIS — Z5111 Encounter for antineoplastic chemotherapy: Secondary | ICD-10-CM | POA: Diagnosis present

## 2019-04-27 LAB — CMP (CANCER CENTER ONLY)
ALT: 12 U/L (ref 0–44)
AST: 14 U/L — ABNORMAL LOW (ref 15–41)
Albumin: 3.7 g/dL (ref 3.5–5.0)
Alkaline Phosphatase: 79 U/L (ref 38–126)
Anion gap: 10 (ref 5–15)
BUN: 13 mg/dL (ref 6–20)
CO2: 27 mmol/L (ref 22–32)
Calcium: 9.5 mg/dL (ref 8.9–10.3)
Chloride: 103 mmol/L (ref 98–111)
Creatinine: 0.95 mg/dL (ref 0.44–1.00)
GFR, Est AFR Am: >60 mL/min (ref >60)
GFR, Estimated: >60 mL/min (ref >60)
Glucose, Bld: 106 mg/dL — ABNORMAL HIGH (ref 70–99)
Potassium: 3.5 mmol/L (ref 3.5–5.1)
Sodium: 140 mmol/L (ref 135–145)
Total Bilirubin: 0.2 mg/dL — ABNORMAL LOW (ref 0.3–1.2)
Total Protein: 6.6 g/dL (ref 6.5–8.1)

## 2019-04-27 LAB — CBC WITH DIFFERENTIAL (CANCER CENTER ONLY)
Abs Immature Granulocytes: 0.51 10*3/uL — ABNORMAL HIGH (ref 0.00–0.07)
Basophils Absolute: 0.1 10*3/uL (ref 0.0–0.1)
Basophils Relative: 1 %
Eosinophils Absolute: 0.3 10*3/uL (ref 0.0–0.5)
Eosinophils Relative: 3 %
HCT: 33.3 % — ABNORMAL LOW (ref 36.0–46.0)
Hemoglobin: 10.4 g/dL — ABNORMAL LOW (ref 12.0–15.0)
Immature Granulocytes: 6 %
Lymphocytes Relative: 20 %
Lymphs Abs: 1.7 10*3/uL (ref 0.7–4.0)
MCH: 27.5 pg (ref 26.0–34.0)
MCHC: 31.2 g/dL (ref 30.0–36.0)
MCV: 88.1 fL (ref 80.0–100.0)
Monocytes Absolute: 0.9 10*3/uL (ref 0.1–1.0)
Monocytes Relative: 11 %
Neutro Abs: 4.9 10*3/uL (ref 1.7–7.7)
Neutrophils Relative %: 59 %
Platelet Count: 446 10*3/uL — ABNORMAL HIGH (ref 150–400)
RBC: 3.78 MIL/uL — ABNORMAL LOW (ref 3.87–5.11)
RDW: 15.3 % (ref 11.5–15.5)
WBC Count: 8.3 10*3/uL (ref 4.0–10.5)
nRBC: 0 % (ref 0.0–0.2)

## 2019-04-27 LAB — IRON AND TIBC
Iron: 56 ug/dL (ref 41–142)
Saturation Ratios: 24 % (ref 21–57)
TIBC: 238 ug/dL (ref 236–444)
UIBC: 182 ug/dL (ref 120–384)

## 2019-04-27 LAB — FERRITIN: Ferritin: 1549 ng/mL — ABNORMAL HIGH (ref 11–307)

## 2019-04-27 MED ORDER — OXALIPLATIN CHEMO INJECTION 100 MG/20ML
76.5000 mg/m2 | Freq: Once | INTRAVENOUS | Status: AC
  Administered 2019-04-27: 140 mg via INTRAVENOUS
  Filled 2019-04-27: qty 20

## 2019-04-27 MED ORDER — DEXTROSE 5 % IV SOLN
Freq: Once | INTRAVENOUS | Status: AC
  Filled 2019-04-27: qty 250

## 2019-04-27 MED ORDER — SODIUM CHLORIDE 0.9 % IV SOLN
132.0000 mg/m2 | Freq: Once | INTRAVENOUS | Status: AC
  Administered 2019-04-27: 240 mg via INTRAVENOUS
  Filled 2019-04-27: qty 10

## 2019-04-27 MED ORDER — PALONOSETRON HCL INJECTION 0.25 MG/5ML
0.2500 mg | Freq: Once | INTRAVENOUS | Status: AC
  Administered 2019-04-27: 0.25 mg via INTRAVENOUS

## 2019-04-27 MED ORDER — DEXAMETHASONE SODIUM PHOSPHATE 10 MG/ML IJ SOLN
INTRAMUSCULAR | Status: AC
  Filled 2019-04-27: qty 1

## 2019-04-27 MED ORDER — ATROPINE SULFATE 1 MG/ML IJ SOLN
INTRAMUSCULAR | Status: AC
  Filled 2019-04-27: qty 1

## 2019-04-27 MED ORDER — DEXAMETHASONE SODIUM PHOSPHATE 10 MG/ML IJ SOLN
10.0000 mg | Freq: Once | INTRAMUSCULAR | Status: AC
  Administered 2019-04-27: 10 mg via INTRAVENOUS

## 2019-04-27 MED ORDER — LEUCOVORIN CALCIUM INJECTION 350 MG
200.0000 mg/m2 | Freq: Once | INTRAVENOUS | Status: AC
  Administered 2019-04-27: 368 mg via INTRAVENOUS
  Filled 2019-04-27: qty 18.4

## 2019-04-27 MED ORDER — SODIUM CHLORIDE 0.9 % IV SOLN
2880.0000 mg/m2 | INTRAVENOUS | Status: DC
  Administered 2019-04-27: 5300 mg via INTRAVENOUS
  Filled 2019-04-27: qty 106

## 2019-04-27 MED ORDER — PALONOSETRON HCL INJECTION 0.25 MG/5ML
INTRAVENOUS | Status: AC
  Filled 2019-04-27: qty 5

## 2019-04-27 MED ORDER — ATROPINE SULFATE 1 MG/ML IJ SOLN
0.5000 mg | Freq: Once | INTRAMUSCULAR | Status: AC
  Administered 2019-04-27: 0.5 mg via INTRAVENOUS

## 2019-04-27 MED ORDER — SODIUM CHLORIDE 0.9 % IV SOLN
150.0000 mg | Freq: Once | INTRAVENOUS | Status: AC
  Administered 2019-04-27: 150 mg via INTRAVENOUS
  Filled 2019-04-27: qty 5

## 2019-04-27 NOTE — Progress Notes (Signed)
Hematology and Oncology Follow Up Visit  Carla Mills [id number] 1970/09/02 48 y.o. 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  Unfortunately, she was hospitalized at [facility] about 2 weeks ago.  She had a twisting of her colon.  Her surgeon, Dr. Jyl Heinz, did a fantastic job.  He did not need surgery.  He basally put a Foley catheter into the colostomy site.  Her: Subsequently was able to "untwist" and work.  Carla Mills I think had a CT scan while she was at [facility].  What she says, there is no evidence of any type of cancer progression.  She is eating okay right now.  Her colostomy is working.  She has had no pain.  There is no bleeding.  She has had no cough.  Surprisingly, her Port-A-Cath I was sure was used at [facility].  She had a peripheral IV.  She has a rash on her right forearm from where she developed a reaction to the tape.  She will see Dr. Crisoforo Oxford in March.  Another CAT scan will be done.  After this, a surgery will be planned.  Currently, I would say her performance status is ECOG 1.    Allergies as of 04/27/2019      Reactions   Bactrim [sulfamethoxazole-trimethoprim] Swelling, Other (See Comments)   Severe generalized swelling   Capecitabine Hives, Other (See Comments)   Legs and arms, after completing therapy   Chlorhexidine Rash   Oxaprozin Hives   Day pro   Penicillins Hives, Other (See Comments)   Has  patient had a PCN reaction causing immediate rash, facial/tongue/throat swelling, SOB or lightheadedness with hypotension: no Has patient had a PCN reaction causing severe rash involving mucus membranes or skin necrosis: {no Has patient had a PCN reaction that required hospitalization no Has patient had a PCN reaction occurring within the last 10 years: no If all of the above answers are "NO", then may proceed with Cephalosporin use.      Medication List       Accurate as of April 27, 2019  8:59 AM. If you have any questions, ask your nurse or doctor.        belladonna-PHENObarbital 16.2 MG/5ML Elix Commonly known as: DONNATAL Take 5 mLs (16.2 mg total) by mouth 4 (four) times daily as needed for cramping.   cefpodoxime 200 MG tablet Commonly known as: VANTIN Take 200 mg by mouth 2 (two) times daily.   cetirizine 10 MG tablet Commonly known as: ZYRTEC Take 10 mg by mouth daily.   dexamethasone 4 MG tablet Commonly known as: DECADRON Take 2 tablets (8 mg total) by mouth daily. Start the day after chemotherapy for 3 days. Take with food.   dexamethasone 4 MG tablet Commonly known as: DECADRON Take 2 tablets by mouth for 4 days FOLLOWING chemotherapy   doxycycline 100 MG tablet Commonly known as: VIBRA-TABS Take 1 tablet (100 mg total) by mouth 2 (two) times daily.  fluticasone 50 MCG/ACT nasal spray Commonly known as: FLONASE Place 1 spray into both nostrils 2 (two) times daily.   HYDROcodone-acetaminophen 7.5-325 MG tablet Commonly known as: NORCO Take 1 tablet by mouth every 6 (six) hours as needed for moderate pain.   ibuprofen 600 MG tablet Commonly known as: ADVIL Take 600 mg by mouth every 6 (six) hours as needed.   Lactulose 20 GM/30ML Soln Take 15 mLs (10 g total) by mouth 3 (three) times daily.   lidocaine-prilocaine cream Commonly known as: EMLA Apply to affected area once   loperamide 2 MG tablet Commonly known as: Imodium A-D Take 2 at onset of  diarrhea, then 1 every 2hrs until 12hr without a BM. May take 2 tab every 4hrs at bedtime. If diarrhea recurs repeat.   LORazepam 1 MG tablet Commonly known as: ATIVAN Take 1 tablet (1 mg total) by mouth every 8 (eight) hours.   metoCLOPramide 10 MG tablet Commonly known as: Reglan Take 2 tablets (20 mg total) by mouth 4 (four) times daily.   ondansetron 8 MG tablet Commonly known as: Zofran Take 1 tablet (8 mg total) by mouth 2 (two) times daily as needed. Start on day 3 after chemotherapy.   pantoprazole 40 MG tablet Commonly known as: Protonix Take 1 tablet (40 mg total) by mouth daily.   polyethylene glycol 17 g packet Commonly known as: MIRALAX / GLYCOLAX Take 17 g by mouth daily.   prochlorperazine 10 MG tablet Commonly known as: COMPAZINE Take 1 tablet (10 mg total) by mouth every 6 (six) hours as needed (Nausea or vomiting).   Urinary Leg Bag Kit 1 Units by Misc.(Non-Drug; Combo Route) route once a week.   Bard Extension Tubing/Connect Misc 1 Units by [employer].(Non-Drug; Combo Route) route twice a week. Urinary leg bag extension tubing       Allergies:  Allergies  Allergen Reactions  . Bactrim [Sulfamethoxazole-Trimethoprim] Swelling and Other (See Comments)    Severe generalized swelling  . Capecitabine Hives and Other (See Comments)    Legs and arms, after completing therapy  . Chlorhexidine Rash  . Oxaprozin Hives    Day pro  . Penicillins Hives and Other (See Comments)    Has patient had a PCN reaction causing immediate rash, facial/tongue/throat swelling, SOB or lightheadedness with hypotension: no Has patient had a PCN reaction causing severe rash involving mucus membranes or skin necrosis: {no Has patient had a PCN reaction that required hospitalization no Has patient had a PCN reaction occurring within the last 10 years: no If all of the above answers are "NO", then may proceed with Cephalosporin use.    Past Medical History, Surgical history, Social  history, and Family History were reviewed and updated.  Review of Systems: Review of Systems  Constitutional: Negative.   HENT: Negative.   Eyes: Negative.   Respiratory: Negative.   Cardiovascular: Negative.   Gastrointestinal: Negative.   Genitourinary: Negative.   Musculoskeletal: Negative.   Skin: Negative.   Neurological: Negative.   Endo/Heme/Allergies: Negative.   Psychiatric/Behavioral: Negative.       Physical Exam:  weight is 155 lb (70.3 kg). Her temporal temperature is 97.1 F (36.2 C) (abnormal). Her blood pressure is 109/77 and her pulse is 93. Her respiration is 18 and oxygen saturation is 100%.   Wt Readings from Last 3 Encounters:  04/27/19 155 lb (70.3 kg)  04/06/19 159 lb 2 oz (72.2 kg)  04/06/19 159 lb 3.2 oz (72.2 kg)    6month 24 scenario 316  morning man Carla Mills over 45 her vital signs show a temperature 97.6.  Pulse 76.  Blood pressure 114/75.  Physical Exam Vitals reviewed.  HENT:     Head: Normocephalic and atraumatic.  Eyes:     Pupils: Pupils are equal, round, and reactive to light.  Cardiovascular:     Rate and Rhythm: Normal rate and regular rhythm.     Heart sounds: Normal heart sounds.  Pulmonary:     Effort: Pulmonary effort is normal.     Breath sounds: Normal breath sounds.  Abdominal:     General: Bowel sounds are normal.     Palpations: Abdomen is soft.     Comments: Abdominal exam shows laparotomy scars that are well-healed.  She has a colostomy in the left lower quadrant.  She has no fluid wave.  There is no guarding or rebound tenderness.  She has no palpable liver or spleen tip.  Musculoskeletal:        General: No tenderness or deformity. Normal range of motion.     Cervical back: Normal range of motion.  Lymphadenopathy:     Cervical: No cervical adenopathy.  Skin:    General: Skin is warm and dry.     Findings: No erythema or rash.  Neurological:     Mental Status: She is alert and oriented to person, place, and time.    Psychiatric:        Behavior: Behavior normal.        Thought Content: Thought content normal.        Judgment: Judgment normal.      Lab Results  Component Value Date   WBC 8.3 04/27/2019   HGB 10.4 (L) 04/27/2019   HCT 33.3 (L) 04/27/2019   MCV 88.1 04/27/2019   PLT 446 (H) 04/27/2019   Lab Results  Component Value Date   FERRITIN 597 (H) 04/06/2019   IRON 20 (L) 04/06/2019   TIBC 171 (L) 04/06/2019   UIBC 151 04/06/2019   IRONPCTSAT 12 (L) 04/06/2019   Lab Results  Component Value Date   RETICCTPCT 0.8 02/12/2015   RBC 3.78 (L) 04/27/2019   No results found for: KPAFRELGTCHN, LAMBDASER, KAPLAMBRATIO No results found for: IGGSERUM, IGA, IGMSERUM No results found for: Odetta Pink, SPEI   Chemistry      Component Value Date/Time   NA 139 04/06/2019 0815   NA 145 03/27/2017 1101   NA 141 07/30/2016 0750   K 3.1 (L) 04/06/2019 0815   K 4.5 03/27/2017 1101   K 4.2 07/30/2016 0750   CL 104 04/06/2019 0815   CL 103 03/27/2017 1101   CO2 25 04/06/2019 0815   CO2 28 03/27/2017 1101   CO2 26 07/30/2016 0750   BUN 11 04/06/2019 0815   BUN 12 03/27/2017 1101   BUN 12.1 07/30/2016 0750   CREATININE 0.90 04/06/2019 0815   CREATININE 0.9 03/27/2017 1101   CREATININE 0.7 07/30/2016 0750      Component Value Date/Time   CALCIUM 8.7 (L) 04/06/2019 0815   CALCIUM 9.6 03/27/2017 1101   CALCIUM 9.5 07/30/2016 0750   ALKPHOS 76 04/06/2019 0815   ALKPHOS 101 (H) 03/27/2017 1101   ALKPHOS 72 07/30/2016 0750   AST 9 (L) 04/06/2019 0815   AST 22 07/30/2016 0750   ALT 12 04/06/2019 0815   ALT 20 03/27/2017 1101   ALT 22 07/30/2016 0750   BILITOT 0.3 04/06/2019 0815   BILITOT 0.54 07/30/2016 0750  Impression and Plan: Carla Mills is a very pleasant 49 yo caucasian female with recurrent colon cancer - HER2 (-)/KRAS (+)/MSI stable/MMR normal/BRAF -.   Of note, on her molecular analysis, there is still no  actionable mutation that we can find.  She does have a high TMB.  However, I really do not think that we will be able to utilize immunotherapy.  She does not have Lynch syndrome as she has a stable MSI.  For right now, we will continue her on chemotherapy.  She will get a treatment today and then in 2 weeks.  After this, I would then imagine that she will have surgery.  I am just happy that there was no evidence of cancer progression that caused this colonic "twisting."  As always she bounces back.  She is lost a little bit of weight but she really looks fantastic.   Carla Napoleon, MD 2/2/20218:59 AM

## 2019-04-27 NOTE — Patient Instructions (Signed)
Irinotecan injection What is this medicine? IRINOTECAN (ir in oh TEE kan ) is a chemotherapy drug. It is used to treat colon and rectal cancer. This medicine may be used for other purposes; ask your health care provider or pharmacist if you have questions. COMMON BRAND NAME(S): Camptosar What should I tell my health care provider before I take this medicine? They need to know if you have any of these conditions:  dehydration  diarrhea  infection (especially a virus infection such as chickenpox, cold sores, or herpes)  liver disease  low blood counts, like low white cell, platelet, or red cell counts  low levels of calcium, magnesium, or potassium in the blood  recent or ongoing radiation therapy  an unusual or allergic reaction to irinotecan, other medicines, foods, dyes, or preservatives  pregnant or trying to get pregnant  breast-feeding How should I use this medicine? This drug is given as an infusion into a vein. It is administered in a hospital or clinic by a specially trained health care professional. Talk to your pediatrician regarding the use of this medicine in children. Special care may be needed. Overdosage: If you think you have taken too much of this medicine contact a poison control center or emergency room at once. NOTE: This medicine is only for you. Do not share this medicine with others. What if I miss a dose? It is important not to miss your dose. Call your doctor or health care professional if you are unable to keep an appointment. What may interact with this medicine? This medicine may interact with the following medications:  antiviral medicines for HIV or AIDS  certain antibiotics like rifampin or rifabutin  certain medicines for fungal infections like itraconazole, ketoconazole, posaconazole, and voriconazole  certain medicines for seizures like carbamazepine, phenobarbital, phenotoin  clarithromycin  gemfibrozil  nefazodone  [facility] John's  Wort This list may not describe all possible interactions. Give your health care provider a list of all the medicines, herbs, non-prescription drugs, or dietary supplements you use. Also tell them if you smoke, drink alcohol, or use illegal drugs. Some items may interact with your medicine. What should I watch for while using this medicine? Your condition will be monitored carefully while you are receiving this medicine. You will need important blood work done while you are taking this medicine. This drug may make you feel generally unwell. This is not uncommon, as chemotherapy can affect healthy cells as well as cancer cells. Report any side effects. Continue your course of treatment even though you feel ill unless your doctor tells you to stop. In some cases, you may be given additional medicines to help with side effects. Follow all directions for their use. You may get drowsy or dizzy. Do not drive, use machinery, or do anything that needs mental alertness until you know how this medicine affects you. Do not stand or sit up quickly, especially if you are an older patient. This reduces the risk of dizzy or fainting spells. Call your health care professional for advice if you get a fever, chills, or sore throat, or other symptoms of a cold or flu. Do not treat yourself. This medicine decreases your body's ability to fight infections. Try to avoid being around people who are sick. Avoid taking products that contain aspirin, acetaminophen, ibuprofen, naproxen, or ketoprofen unless instructed by your doctor. These medicines may hide a fever. This medicine may increase your risk to bruise or bleed. Call your doctor or health care professional if you notice  any unusual bleeding. Be careful brushing and flossing your teeth or using a toothpick because you may get an infection or bleed more easily. If you have any dental work done, tell your dentist you are receiving this medicine. Do not become pregnant while  taking this medicine or for 6 months after stopping it. Women should inform their health care professional if they wish to become pregnant or think they might be pregnant. Men should not father a child while taking this medicine and for 3 months after stopping it. There is potential for serious side effects to an unborn child. Talk to your health care professional for more information. Do not breast-feed an infant while taking this medicine or for 7 days after stopping it. This medicine has caused ovarian failure in some women. This medicine may make it more difficult to get pregnant. Talk to your health care professional if you are concerned about your fertility. This medicine has caused decreased sperm counts in some men. This may make it more difficult to father a child. Talk to your health care professional if you are concerned about your fertility. What side effects may I notice from receiving this medicine? Side effects that you should report to your doctor or health care professional as soon as possible:  allergic reactions like skin rash, itching or hives, swelling of the face, lips, or tongue  chest pain  diarrhea  flushing, runny nose, sweating during infusion  low blood counts - this medicine may decrease the number of white blood cells, red blood cells and platelets. You may be at increased risk for infections and bleeding.  nausea, vomiting  pain, swelling, warmth in the leg  signs of decreased platelets or bleeding - bruising, pinpoint red spots on the skin, black, tarry stools, blood in the urine  signs of infection - fever or chills, cough, sore throat, pain or difficulty passing urine  signs of decreased red blood cells - unusually weak or tired, fainting spells, lightheadedness Side effects that usually do not require medical attention (report to your doctor or health care professional if they continue or are bothersome):  constipation  hair loss  headache  loss of  appetite  mouth sores  stomach pain This list may not describe all possible side effects. Call your doctor for medical advice about side effects. You may report side effects to FDA at 1-800-FDA-1088. Where should I keep my medicine? This drug is given in a hospital or clinic and will not be stored at home. NOTE: This sheet is a summary. It may not cover all possible information. If you have questions about this medicine, talk to your doctor, pharmacist, or health care provider.  2020 [employer] Standard (2018-05-01 10:09:17) Leucovorin injection What is this medicine? LEUCOVORIN (loo koe VOR in) is used to prevent or treat the harmful effects of some medicines. This medicine is used to treat anemia caused by a low amount of folic acid in the body. It is also used with 5-fluorouracil (5-FU) to treat colon cancer. This medicine may be used for other purposes; ask your health care provider or pharmacist if you have questions. What should I tell my health care provider before I take this medicine? They need to know if you have any of these conditions:  anemia from low levels of vitamin B-12 in the blood  an unusual or allergic reaction to leucovorin, folic acid, other medicines, foods, dyes, or preservatives  pregnant or trying to get pregnant  breast-feeding How should I use  this medicine? This medicine is for injection into a muscle or into a vein. It is given by a health care professional in a hospital or clinic setting. Talk to your pediatrician regarding the use of this medicine in children. Special care may be needed. Overdosage: If you think you have taken too much of this medicine contact a poison control center or emergency room at once. NOTE: This medicine is only for you. Do not share this medicine with others. What if I miss a dose? This does not apply. What may interact with this  medicine?  capecitabine  fluorouracil  phenobarbital  phenytoin  primidone  trimethoprim-sulfamethoxazole This list may not describe all possible interactions. Give your health care provider a list of all the medicines, herbs, non-prescription drugs, or dietary supplements you use. Also tell them if you smoke, drink alcohol, or use illegal drugs. Some items may interact with your medicine. What should I watch for while using this medicine? Your condition will be monitored carefully while you are receiving this medicine. This medicine may increase the side effects of 5-fluorouracil, 5-FU. Tell your doctor or health care professional if you have diarrhea or mouth sores that do not get better or that get worse. What side effects may I notice from receiving this medicine? Side effects that you should report to your doctor or health care professional as soon as possible:  allergic reactions like skin rash, itching or hives, swelling of the face, lips, or tongue  breathing problems  fever, infection  mouth sores  unusual bleeding or bruising  unusually weak or tired Side effects that usually do not require medical attention (report to your doctor or health care professional if they continue or are bothersome):  constipation or diarrhea  loss of appetite  nausea, vomiting This list may not describe all possible side effects. Call your doctor for medical advice about side effects. You may report side effects to FDA at 1-800-FDA-1088. Where should I keep my medicine? This drug is given in a hospital or clinic and will not be stored at home. NOTE: This sheet is a summary. It may not cover all possible information. If you have questions about this medicine, talk to your doctor, pharmacist, or health care provider.  2020 [employer]/Gold Standard (2007-09-15 16:50:29) Fluorouracil, 5-FU injection What is this medicine? FLUOROURACIL, 5-FU (flure oh YOOR a sil) is a chemotherapy drug. It  slows the growth of cancer cells. This medicine is used to treat many types of cancer like breast cancer, colon or rectal cancer, pancreatic cancer, and stomach cancer. This medicine may be used for other purposes; ask your health care provider or pharmacist if you have questions. COMMON BRAND NAME(S): Adrucil What should I tell my health care provider before I take this medicine? They need to know if you have any of these conditions:  blood disorders  dihydropyrimidine dehydrogenase (DPD) deficiency  infection (especially a virus infection such as chickenpox, cold sores, or herpes)  kidney disease  liver disease  malnourished, poor nutrition  recent or ongoing radiation therapy  an unusual or allergic reaction to fluorouracil, other chemotherapy, other medicines, foods, dyes, or preservatives  pregnant or trying to get pregnant  breast-feeding How should I use this medicine? This drug is given as an infusion or injection into a vein. It is administered in a hospital or clinic by a specially trained health care professional. Talk to your pediatrician regarding the use of this medicine in children. Special care may be needed. Overdosage: If you  think you have taken too much of this medicine contact a poison control center or emergency room at once. NOTE: This medicine is only for you. Do not share this medicine with others. What if I miss a dose? It is important not to miss your dose. Call your doctor or health care professional if you are unable to keep an appointment. What may interact with this medicine?  allopurinol  cimetidine  dapsone  digoxin  hydroxyurea  leucovorin  levamisole  medicines for seizures like ethotoin, fosphenytoin, phenytoin  medicines to increase blood counts like filgrastim, pegfilgrastim, sargramostim  medicines that treat or prevent blood clots like warfarin, enoxaparin, and  dalteparin  methotrexate  metronidazole  pyrimethamine  some other chemotherapy drugs like busulfan, cisplatin, estramustine, vinblastine  trimethoprim  trimetrexate  vaccines Talk to your doctor or health care professional before taking any of these medicines:  acetaminophen  aspirin  ibuprofen  ketoprofen  naproxen This list may not describe all possible interactions. Give your health care provider a list of all the medicines, herbs, non-prescription drugs, or dietary supplements you use. Also tell them if you smoke, drink alcohol, or use illegal drugs. Some items may interact with your medicine. What should I watch for while using this medicine? Visit your doctor for checks on your progress. This drug may make you feel generally unwell. This is not uncommon, as chemotherapy can affect healthy cells as well as cancer cells. Report any side effects. Continue your course of treatment even though you feel ill unless your doctor tells you to stop. In some cases, you may be given additional medicines to help with side effects. Follow all directions for their use. Call your doctor or health care professional for advice if you get a fever, chills or sore throat, or other symptoms of a cold or flu. Do not treat yourself. This drug decreases your body's ability to fight infections. Try to avoid being around people who are sick. This medicine may increase your risk to bruise or bleed. Call your doctor or health care professional if you notice any unusual bleeding. Be careful brushing and flossing your teeth or using a toothpick because you may get an infection or bleed more easily. If you have any dental work done, tell your dentist you are receiving this medicine. Avoid taking products that contain aspirin, acetaminophen, ibuprofen, naproxen, or ketoprofen unless instructed by your doctor. These medicines may hide a fever. Do not become pregnant while taking this medicine. Women should  inform their doctor if they wish to become pregnant or think they might be pregnant. There is a potential for serious side effects to an unborn child. Talk to your health care professional or pharmacist for more information. Do not breast-feed an infant while taking this medicine. Men should inform their doctor if they wish to father a child. This medicine may lower sperm counts. Do not treat diarrhea with over the counter products. Contact your doctor if you have diarrhea that lasts more than 2 days or if it is severe and watery. This medicine can make you more sensitive to the sun. Keep out of the sun. If you cannot avoid being in the sun, wear protective clothing and use sunscreen. Do not use sun lamps or tanning beds/booths. What side effects may I notice from receiving this medicine? Side effects that you should report to your doctor or health care professional as soon as possible:  allergic reactions like skin rash, itching or hives, swelling of the face, lips, or  tongue  low blood counts - this medicine may decrease the number of white blood cells, red blood cells and platelets. You may be at increased risk for infections and bleeding.  signs of infection - fever or chills, cough, sore throat, pain or difficulty passing urine  signs of decreased platelets or bleeding - bruising, pinpoint red spots on the skin, black, tarry stools, blood in the urine  signs of decreased red blood cells - unusually weak or tired, fainting spells, lightheadedness  breathing problems  changes in vision  chest pain  mouth sores  nausea and vomiting  pain, swelling, redness at site where injected  pain, tingling, numbness in the hands or feet  redness, swelling, or sores on hands or feet  stomach pain  unusual bleeding Side effects that usually do not require medical attention (report to your doctor or health care professional if they continue or are bothersome):  changes in finger or toe  nails  diarrhea  dry or itchy skin  hair loss  headache  loss of appetite  sensitivity of eyes to the light  stomach upset  unusually teary eyes This list may not describe all possible side effects. Call your doctor for medical advice about side effects. You may report side effects to FDA at 1-800-FDA-1088. Where should I keep my medicine? This drug is given in a hospital or clinic and will not be stored at home. NOTE: This sheet is a summary. It may not cover all possible information. If you have questions about this medicine, talk to your doctor, pharmacist, or health care provider.  2020 [employer]vier/Gold Standard (2007-07-15 13:53:16)

## 2019-04-29 ENCOUNTER — Inpatient Hospital Stay: Payer: BC Managed Care – PPO

## 2019-04-29 ENCOUNTER — Other Ambulatory Visit: Payer: Self-pay

## 2019-04-29 VITALS — BP 105/67 | HR 68 | Temp 97.3°F | Resp 18

## 2019-04-29 DIAGNOSIS — C189 Malignant neoplasm of colon, unspecified: Secondary | ICD-10-CM | POA: Diagnosis not present

## 2019-04-29 DIAGNOSIS — C187 Malignant neoplasm of sigmoid colon: Secondary | ICD-10-CM

## 2019-04-29 MED ORDER — SODIUM CHLORIDE 0.9% FLUSH
10.0000 mL | INTRAVENOUS | Status: DC | PRN
  Administered 2019-04-29: 10 mL
  Filled 2019-04-29: qty 10

## 2019-04-29 MED ORDER — ALTEPLASE 2 MG IJ SOLR
2.0000 mg | Freq: Once | INTRAMUSCULAR | Status: DC | PRN
  Filled 2019-04-29: qty 2

## 2019-04-29 MED ORDER — HEPARIN SOD (PORK) LOCK FLUSH 100 UNIT/ML IV SOLN
500.0000 [IU] | Freq: Once | INTRAVENOUS | Status: AC | PRN
  Administered 2019-04-29: 500 [IU]
  Filled 2019-04-29: qty 5

## 2019-05-04 ENCOUNTER — Ambulatory Visit: Payer: BC Managed Care – PPO | Admitting: Hematology & Oncology

## 2019-05-04 ENCOUNTER — Other Ambulatory Visit: Payer: BC Managed Care – PPO

## 2019-05-04 ENCOUNTER — Ambulatory Visit: Payer: BC Managed Care – PPO

## 2019-05-05 MED FILL — DEXAMETHASONE 4 MG TABLET: 4 | 4 days supply | Qty: 8 | Fill #3

## 2019-05-05 MED FILL — PANTOPRAZOLE SOD DR 40 MG T: 40 | 30 days supply | Qty: 30 | Fill #1

## 2019-05-11 ENCOUNTER — Inpatient Hospital Stay: Payer: BC Managed Care – PPO

## 2019-05-11 ENCOUNTER — Inpatient Hospital Stay (HOSPITAL_BASED_OUTPATIENT_CLINIC_OR_DEPARTMENT_OTHER): Payer: BC Managed Care – PPO | Admitting: Hematology & Oncology

## 2019-05-11 ENCOUNTER — Encounter: Payer: Self-pay | Admitting: Hematology & Oncology

## 2019-05-11 ENCOUNTER — Telehealth: Payer: Self-pay | Admitting: Hematology & Oncology

## 2019-05-11 ENCOUNTER — Other Ambulatory Visit: Payer: Self-pay

## 2019-05-11 VITALS — BP 107/61 | HR 79 | Temp 97.9°F | Resp 16 | Wt 158.0 lb

## 2019-05-11 DIAGNOSIS — C189 Malignant neoplasm of colon, unspecified: Secondary | ICD-10-CM | POA: Diagnosis not present

## 2019-05-11 DIAGNOSIS — C772 Secondary and unspecified malignant neoplasm of intra-abdominal lymph nodes: Secondary | ICD-10-CM

## 2019-05-11 DIAGNOSIS — C187 Malignant neoplasm of sigmoid colon: Secondary | ICD-10-CM

## 2019-05-11 LAB — CBC WITH DIFFERENTIAL (CANCER CENTER ONLY)
Abs Immature Granulocytes: 0.01 10*3/uL (ref 0.00–0.07)
Basophils Absolute: 0 10*3/uL (ref 0.0–0.1)
Basophils Relative: 1 %
Eosinophils Absolute: 0.3 10*3/uL (ref 0.0–0.5)
Eosinophils Relative: 7 %
HCT: 32.2 % — ABNORMAL LOW (ref 36.0–46.0)
Hemoglobin: 10.3 g/dL — ABNORMAL LOW (ref 12.0–15.0)
Immature Granulocytes: 0 %
Lymphocytes Relative: 28 %
Lymphs Abs: 1 10*3/uL (ref 0.7–4.0)
MCH: 28.3 pg (ref 26.0–34.0)
MCHC: 32 g/dL (ref 30.0–36.0)
MCV: 88.5 fL (ref 80.0–100.0)
Monocytes Absolute: 0.3 10*3/uL (ref 0.1–1.0)
Monocytes Relative: 9 %
Neutro Abs: 2 10*3/uL (ref 1.7–7.7)
Neutrophils Relative %: 55 %
Platelet Count: 252 10*3/uL (ref 150–400)
RBC: 3.64 MIL/uL — ABNORMAL LOW (ref 3.87–5.11)
RDW: 16.5 % — ABNORMAL HIGH (ref 11.5–15.5)
WBC Count: 3.6 10*3/uL — ABNORMAL LOW (ref 4.0–10.5)
nRBC: 0 % (ref 0.0–0.2)

## 2019-05-11 LAB — IRON AND TIBC
Iron: 68 ug/dL (ref 41–142)
Saturation Ratios: 24 % (ref 21–57)
TIBC: 285 ug/dL (ref 236–444)
UIBC: 217 ug/dL (ref 120–384)

## 2019-05-11 LAB — CMP (CANCER CENTER ONLY)
ALT: 37 U/L (ref 0–44)
AST: 23 U/L (ref 15–41)
Albumin: 3.7 g/dL (ref 3.5–5.0)
Alkaline Phosphatase: 118 U/L (ref 38–126)
Anion gap: 7 (ref 5–15)
BUN: 19 mg/dL (ref 6–20)
CO2: 27 mmol/L (ref 22–32)
Calcium: 9.4 mg/dL (ref 8.9–10.3)
Chloride: 108 mmol/L (ref 98–111)
Creatinine: 0.77 mg/dL (ref 0.44–1.00)
GFR, Est AFR Am: >60 mL/min (ref >60)
GFR, Estimated: >60 mL/min (ref >60)
Glucose, Bld: 103 mg/dL — ABNORMAL HIGH (ref 70–99)
Potassium: 3.6 mmol/L (ref 3.5–5.1)
Sodium: 142 mmol/L (ref 135–145)
Total Bilirubin: 0.2 mg/dL — ABNORMAL LOW (ref 0.3–1.2)
Total Protein: 6.1 g/dL — ABNORMAL LOW (ref 6.5–8.1)

## 2019-05-11 LAB — LACTATE DEHYDROGENASE: LDH: 144 U/L (ref 98–192)

## 2019-05-11 LAB — FERRITIN: Ferritin: 1910 ng/mL — ABNORMAL HIGH (ref 11–307)

## 2019-05-11 MED ORDER — SODIUM CHLORIDE 0.9 % IV SOLN
150.0000 mg | Freq: Once | INTRAVENOUS | Status: AC
  Administered 2019-05-11: 150 mg via INTRAVENOUS
  Filled 2019-05-11: qty 150

## 2019-05-11 MED ORDER — DEXAMETHASONE SODIUM PHOSPHATE 10 MG/ML IJ SOLN
INTRAMUSCULAR | Status: AC
  Filled 2019-05-11: qty 1

## 2019-05-11 MED ORDER — PALONOSETRON HCL INJECTION 0.25 MG/5ML
INTRAVENOUS | Status: AC
  Filled 2019-05-11: qty 5

## 2019-05-11 MED ORDER — SODIUM CHLORIDE 0.9 % IV SOLN
2880.0000 mg/m2 | INTRAVENOUS | Status: DC
  Administered 2019-05-11: 5300 mg via INTRAVENOUS
  Filled 2019-05-11: qty 106

## 2019-05-11 MED ORDER — ATROPINE SULFATE 1 MG/ML IJ SOLN
INTRAMUSCULAR | Status: AC
  Filled 2019-05-11: qty 1

## 2019-05-11 MED ORDER — DEXTROSE 5 % IV SOLN
Freq: Once | INTRAVENOUS | Status: AC
  Filled 2019-05-11: qty 250

## 2019-05-11 MED ORDER — SODIUM CHLORIDE 0.9 % IV SOLN
132.0000 mg/m2 | Freq: Once | INTRAVENOUS | Status: AC
  Administered 2019-05-11: 240 mg via INTRAVENOUS
  Filled 2019-05-11: qty 10

## 2019-05-11 MED ORDER — PALONOSETRON HCL INJECTION 0.25 MG/5ML
0.2500 mg | Freq: Once | INTRAVENOUS | Status: AC
  Administered 2019-05-11: 0.25 mg via INTRAVENOUS

## 2019-05-11 MED ORDER — LEUCOVORIN CALCIUM INJECTION 350 MG
200.0000 mg/m2 | Freq: Once | INTRAVENOUS | Status: AC
  Administered 2019-05-11: 368 mg via INTRAVENOUS
  Filled 2019-05-11: qty 18.4

## 2019-05-11 MED ORDER — DEXAMETHASONE SODIUM PHOSPHATE 10 MG/ML IJ SOLN
10.0000 mg | Freq: Once | INTRAMUSCULAR | Status: AC
  Administered 2019-05-11: 10 mg via INTRAVENOUS

## 2019-05-11 MED ORDER — ATROPINE SULFATE 1 MG/ML IJ SOLN
0.5000 mg | Freq: Once | INTRAMUSCULAR | Status: AC
  Administered 2019-05-11: 0.5 mg via INTRAVENOUS

## 2019-05-11 MED ORDER — OXALIPLATIN CHEMO INJECTION 100 MG/20ML
76.5000 mg/m2 | Freq: Once | INTRAVENOUS | Status: AC
  Administered 2019-05-11: 140 mg via INTRAVENOUS
  Filled 2019-05-11: qty 20

## 2019-05-11 NOTE — Telephone Encounter (Signed)
Appointments scheduled patient declined calendar per 2/16 los

## 2019-05-11 NOTE — Patient Instructions (Signed)
[facility] Discharge Instructions for Patients Receiving Chemotherapy  Today you received the following chemotherapy agents 5FU, Leucovorin, Irinotecan  To help prevent nausea and vomiting after your treatment, we encourage you to take your nausea medication    If you develop nausea and vomiting that is not controlled by your nausea medication, call the clinic.   BELOW ARE SYMPTOMS THAT SHOULD BE REPORTED IMMEDIATELY:  *FEVER GREATER THAN 100.5 F  *CHILLS WITH OR WITHOUT FEVER  NAUSEA AND VOMITING THAT IS NOT CONTROLLED WITH YOUR NAUSEA MEDICATION  *UNUSUAL SHORTNESS OF BREATH  *UNUSUAL BRUISING OR BLEEDING  TENDERNESS IN MOUTH AND THROAT WITH OR WITHOUT PRESENCE OF ULCERS  *URINARY PROBLEMS  *BOWEL PROBLEMS  UNUSUAL RASH Items with * indicate a potential emergency and should be followed up as soon as possible.  Feel free to call the clinic should you have any questions or concerns. The clinic phone number is [phone].  Please show the CHEMO ALERT CARD at check-in to the Emergency Department and triage nurse.   

## 2019-05-11 NOTE — Progress Notes (Signed)
Hematology and Oncology Follow Up Visit  Carla Mills [id number] 1970/11/14 49 y.o. 05/11/2019   Principle Diagnosis:  Recurrent colon cancer - HER2 (-)/KRAS mutant/MSI stable/MMR normal/BRAF (wt) -   Past Therapy: FOLFOXIRI - s/p cycle #12 -completed on 11/12/2016 Radiation therapy with Xeloda - completed on 06/06/2017 Status post exploratory laparotomy with HIPEC - 07/29/2016 FOLFOXIRI/Avastin - s/p cycle #8 (Avastin on hold)  Current Therapy:   Xeloda/Avastin - cycle #7 - started on 04/08/2018  ( Xeloda is 2500 po BID x 10 day) -- d/c on 09/29/2018 due to fistula FOLFOXIRI - s/p cycle #3   Interim History:  Carla Mills is here today for for follow-up.  She looks fantastic today.  She seems to be doing quite well.  She is getting ready for surgery.  His sounds like surgery will be in early March.  She will have surgery at [facility].  She has had no problems with her bowels or bladder.  Her colostomy is working quite nicely right now.  She did have a stent changed.  This was for her urine.  She has had no problems with hematuria.  She has had no cough.  She has had no fever.  She has had no headache.  She has had no mouth sores.  There is some tingling in the hands and feet.  She has had no rashes.  There is been no leg swelling.  Overall, her performance status is ECOG 1.    Allergies as of 05/11/2019      Reactions   Bactrim [sulfamethoxazole-trimethoprim] Swelling, Other (See Comments)   Severe generalized swelling   Capecitabine Hives, Other (See Comments)   Legs and arms, after completing therapy   Chlorhexidine Rash   Oxaprozin Hives   Day pro   Penicillins Hives, Other (See Comments)   Has patient had a PCN reaction causing immediate rash, facial/tongue/throat swelling, SOB or lightheadedness with hypotension: no Has patient had a PCN reaction causing severe rash involving mucus membranes or skin necrosis: {no Has patient had a PCN  reaction that required hospitalization no Has patient had a PCN reaction occurring within the last 10 years: no If all of the above answers are "NO", then may proceed with Cephalosporin use.      Medication List       Accurate as of May 11, 2019  9:23 AM. If you have any questions, ask your nurse or doctor.        belladonna-PHENObarbital 16.2 MG/5ML Elix Commonly known as: DONNATAL Take 5 mLs (16.2 mg total) by mouth 4 (four) times daily as needed for cramping.   cefpodoxime 200 MG tablet Commonly known as: VANTIN Take 200 mg by mouth 2 (two) times daily.   cetirizine 10 MG tablet Commonly known as: ZYRTEC Take 10 mg by mouth daily.   dexamethasone 4 MG tablet Commonly known as: DECADRON Take 2 tablets (8 mg total) by mouth daily. Start the day after chemotherapy for 3 days. Take with food.   dexamethasone 4 MG tablet Commonly known as: DECADRON Take 2 tablets by mouth for 4 days FOLLOWING chemotherapy   doxycycline 100 MG tablet Commonly known as: VIBRA-TABS Take 1 tablet (100 mg total) by mouth 2 (two) times daily.   fluticasone 50 MCG/ACT nasal spray Commonly known as: FLONASE Place 1 spray into both nostrils 2 (two) times daily.   HYDROcodone-acetaminophen 7.5-325 MG tablet Commonly known as: NORCO Take 1 tablet by mouth every 6 (six) hours as needed for moderate pain.  ibuprofen 600 MG tablet Commonly known as: ADVIL Take 600 mg by mouth every 6 (six) hours as needed.   Lactulose 20 GM/30ML Soln Take 15 mLs (10 g total) by mouth 3 (three) times daily.   lidocaine-prilocaine cream Commonly known as: EMLA Apply to affected area once   loperamide 2 MG tablet Commonly known as: Imodium A-D Take 2 at onset of diarrhea, then 1 every 2hrs until 12hr without a BM. May take 2 tab every 4hrs at bedtime. If diarrhea recurs repeat.   LORazepam 1 MG tablet Commonly known as: ATIVAN Take 1 tablet (1 mg total) by mouth every 8 (eight) hours.     metoCLOPramide 10 MG tablet Commonly known as: Reglan Take 2 tablets (20 mg total) by mouth 4 (four) times daily.   ondansetron 8 MG tablet Commonly known as: Zofran Take 1 tablet (8 mg total) by mouth 2 (two) times daily as needed. Start on day 3 after chemotherapy.   pantoprazole 40 MG tablet Commonly known as: Protonix Take 1 tablet (40 mg total) by mouth daily.   polyethylene glycol 17 g packet Commonly known as: MIRALAX / GLYCOLAX Take 17 g by mouth daily.   prochlorperazine 10 MG tablet Commonly known as: COMPAZINE Take 1 tablet (10 mg total) by mouth every 6 (six) hours as needed (Nausea or vomiting).   Urinary Leg Bag Kit 1 Units by Misc.(Non-Drug; Combo Route) route once a week.   Bard Extension Tubing/Connect Misc 1 Units by [employer].(Non-Drug; Combo Route) route twice a week. Urinary leg bag extension tubing       Allergies:  Allergies  Allergen Reactions  . Bactrim [Sulfamethoxazole-Trimethoprim] Swelling and Other (See Comments)    Severe generalized swelling  . Capecitabine Hives and Other (See Comments)    Legs and arms, after completing therapy  . Chlorhexidine Rash  . Oxaprozin Hives    Day pro  . Penicillins Hives and Other (See Comments)    Has patient had a PCN reaction causing immediate rash, facial/tongue/throat swelling, SOB or lightheadedness with hypotension: no Has patient had a PCN reaction causing severe rash involving mucus membranes or skin necrosis: {no Has patient had a PCN reaction that required hospitalization no Has patient had a PCN reaction occurring within the last 10 years: no If all of the above answers are "NO", then may proceed with Cephalosporin use.    Past Medical History, Surgical history, Social history, and Family History were reviewed and updated.  Review of Systems: Review of Systems  Constitutional: Negative.   HENT: Negative.   Eyes: Negative.   Respiratory: Negative.   Cardiovascular: Negative.    Gastrointestinal: Negative.   Genitourinary: Negative.   Musculoskeletal: Negative.   Skin: Negative.   Neurological: Negative.   Endo/Heme/Allergies: Negative.   Psychiatric/Behavioral: Negative.       Physical Exam:  weight is 158 lb (71.7 kg). Her temporal temperature is 97.9 F (36.6 C). Her blood pressure is 107/61 and her pulse is 79. Her respiration is 16 and oxygen saturation is 100%.   Wt Readings from Last 3 Encounters:  05/11/19 158 lb (71.7 kg)  04/27/19 155 lb (70.3 kg)  04/06/19 159 lb 2 oz (72.2 kg)    38month 24 scenario 316 morning man Eric over 45 her vital signs show a temperature 97.6.  Pulse 76.  Blood pressure 114/75.  Physical Exam Vitals reviewed.  HENT:     Head: Normocephalic and atraumatic.  Eyes:     Pupils: Pupils are equal, round, and reactive  to light.  Cardiovascular:     Rate and Rhythm: Normal rate and regular rhythm.     Heart sounds: Normal heart sounds.  Pulmonary:     Effort: Pulmonary effort is normal.     Breath sounds: Normal breath sounds.  Abdominal:     General: Bowel sounds are normal.     Palpations: Abdomen is soft.     Comments: Abdominal exam shows laparotomy scars that are well-healed.  She has a colostomy in the left lower quadrant.  She has no fluid wave.  There is no guarding or rebound tenderness.  She has no palpable liver or spleen tip.  Musculoskeletal:        General: No tenderness or deformity. Normal range of motion.     Cervical back: Normal range of motion.  Lymphadenopathy:     Cervical: No cervical adenopathy.  Skin:    General: Skin is warm and dry.     Findings: No erythema or rash.  Neurological:     Mental Status: She is alert and oriented to person, place, and time.  Psychiatric:        Behavior: Behavior normal.        Thought Content: Thought content normal.        Judgment: Judgment normal.      Lab Results  Component Value Date   WBC 8.3 04/27/2019   HGB 10.4 (L) 04/27/2019   HCT  33.3 (L) 04/27/2019   MCV 88.1 04/27/2019   PLT 446 (H) 04/27/2019   Lab Results  Component Value Date   FERRITIN 1,549 (H) 04/27/2019   IRON 56 04/27/2019   TIBC 238 04/27/2019   UIBC 182 04/27/2019   IRONPCTSAT 24 04/27/2019   Lab Results  Component Value Date   RETICCTPCT 0.8 02/12/2015   RBC 3.78 (L) 04/27/2019   No results found for: KPAFRELGTCHN, LAMBDASER, KAPLAMBRATIO No results found for: IGGSERUM, IGA, IGMSERUM No results found for: Odetta Pink, SPEI   Chemistry      Component Value Date/Time   NA 140 04/27/2019 0815   NA 145 03/27/2017 1101   NA 141 07/30/2016 0750   K 3.5 04/27/2019 0815   K 4.5 03/27/2017 1101   K 4.2 07/30/2016 0750   CL 103 04/27/2019 0815   CL 103 03/27/2017 1101   CO2 27 04/27/2019 0815   CO2 28 03/27/2017 1101   CO2 26 07/30/2016 0750   BUN 13 04/27/2019 0815   BUN 12 03/27/2017 1101   BUN 12.1 07/30/2016 0750   CREATININE 0.95 04/27/2019 0815   CREATININE 0.9 03/27/2017 1101   CREATININE 0.7 07/30/2016 0750      Component Value Date/Time   CALCIUM 9.5 04/27/2019 0815   CALCIUM 9.6 03/27/2017 1101   CALCIUM 9.5 07/30/2016 0750   ALKPHOS 79 04/27/2019 0815   ALKPHOS 101 (H) 03/27/2017 1101   ALKPHOS 72 07/30/2016 0750   AST 14 (L) 04/27/2019 0815   AST 22 07/30/2016 0750   ALT 12 04/27/2019 0815   ALT 20 03/27/2017 1101   ALT 22 07/30/2016 0750   BILITOT 0.2 (L) 04/27/2019 0815   BILITOT 0.54 07/30/2016 0750       Impression and Plan: Ms. Renstrom is a very pleasant 49 yo caucasian female with recurrent colon cancer - HER2 (-)/KRAS (+)/MSI stable/MMR normal/BRAF -.   Of note, on her molecular analysis, there is still no actionable mutation that we can find.  She does have a high TMB.  However, I really do not think that we will be able to utilize immunotherapy.  She does not have Lynch syndrome as she has a stable MSI.  This week her last cycle of chemotherapy before  surgery.  Para she will have a CAT scan at [facility] next week.  Hopefully, surgery will be about 2 or 3 weeks afterwards.  We likely will plan to get her back to see [place] in about 6 weeks or so.  I have a feeling that she is going to have a fairly extensive surgery at [facility] so recovery might take a while.   Volanda Napoleon, MD 2/16/20219:23 AM

## 2019-05-13 ENCOUNTER — Other Ambulatory Visit: Payer: Self-pay

## 2019-05-13 ENCOUNTER — Inpatient Hospital Stay: Payer: BC Managed Care – PPO

## 2019-05-13 DIAGNOSIS — C772 Secondary and unspecified malignant neoplasm of intra-abdominal lymph nodes: Secondary | ICD-10-CM

## 2019-05-13 DIAGNOSIS — C189 Malignant neoplasm of colon, unspecified: Secondary | ICD-10-CM | POA: Diagnosis not present

## 2019-05-13 DIAGNOSIS — C187 Malignant neoplasm of sigmoid colon: Secondary | ICD-10-CM

## 2019-05-13 MED ORDER — SODIUM CHLORIDE 0.9% FLUSH
10.0000 mL | INTRAVENOUS | Status: DC | PRN
  Administered 2019-05-13: 10 mL
  Filled 2019-05-13: qty 10

## 2019-05-13 MED ORDER — HEPARIN SOD (PORK) LOCK FLUSH 100 UNIT/ML IV SOLN
500.0000 [IU] | Freq: Once | INTRAVENOUS | Status: AC | PRN
  Administered 2019-05-13: 500 [IU]
  Filled 2019-05-13: qty 5

## 2019-05-13 NOTE — Patient Instructions (Signed)
Implanted Port Insertion, Care After °This sheet gives you information about how to care for yourself after your procedure. Your health care provider may also give you more specific instructions. If you have problems or questions, contact your health care provider. °What can I expect after the procedure? °After the procedure, it is common to have: °· Discomfort at the port insertion site. °· Bruising on the skin over the port. This should improve over 3-4 days. °Follow these instructions at home: °Port care °· After your port is placed, you will get a manufacturer's information card. The card has information about your port. Keep this card with you at all times. °· Take care of the port as told by your health care provider. Ask your health care provider if you or a family member can get training for taking care of the port at home. A home health care nurse may also take care of the port. °· Make sure to remember what type of port you have. °Incision care ° °  ° °· Follow instructions from your health care provider about how to take care of your port insertion site. Make sure you: °? Wash your hands with soap and water before and after you change your bandage (dressing). If soap and water are not available, use hand sanitizer. °? Change your dressing as told by your health care provider. °? Leave stitches (sutures), skin glue, or adhesive strips in place. These skin closures may need to stay in place for 2 weeks or longer. If adhesive strip edges start to loosen and curl up, you may trim the loose edges. Do not remove adhesive strips completely unless your health care provider tells you to do that. °· Check your port insertion site every day for signs of infection. Check for: °? Redness, swelling, or pain. °? Fluid or blood. °? Warmth. °? Pus or a bad smell. °Activity °· Return to your normal activities as told by your health care provider. Ask your health care provider what activities are safe for you. °· Do not  lift anything that is heavier than 10 lb (4.5 kg), or the limit that you are told, until your health care provider says that it is safe. °General instructions °· Take over-the-counter and prescription medicines only as told by your health care provider. °· Do not take baths, swim, or use a hot tub until your health care provider approves. Ask your health care provider if you may take showers. You may only be allowed to take sponge baths. °· Do not drive for 24 hours if you were given a sedative during your procedure. °· Wear a medical alert bracelet in case of an emergency. This will tell any health care providers that you have a port. °· Keep all follow-up visits as told by your health care provider. This is important. °Contact a health care provider if: °· You cannot flush your port with saline as directed, or you cannot draw blood from the port. °· You have a fever or chills. °· You have redness, swelling, or pain around your port insertion site. °· You have fluid or blood coming from your port insertion site. °· Your port insertion site feels warm to the touch. °· You have pus or a bad smell coming from the port insertion site. °Get help right away if: °· You have chest pain or shortness of breath. °· You have bleeding from your port that you cannot control. °Summary °· Take care of the port as told by your health   care provider. Keep the manufacturer's information card with you at all times. °· Change your dressing as told by your health care provider. °· Contact a health care provider if you have a fever or chills or if you have redness, swelling, or pain around your port insertion site. °· Keep all follow-up visits as told by your health care provider. °This information is not intended to replace advice given to you by your health care provider. Make sure you discuss any questions you have with your health care provider. °Document Revised: 10/07/2017 Document Reviewed: 10/07/2017 °Elsevier Patient Education ©  2020 [employer] ° °

## 2019-05-18 ENCOUNTER — Ambulatory Visit: Payer: BC Managed Care – PPO | Admitting: Hematology & Oncology

## 2019-05-18 ENCOUNTER — Other Ambulatory Visit: Payer: BC Managed Care – PPO

## 2019-05-18 ENCOUNTER — Ambulatory Visit: Payer: BC Managed Care – PPO

## 2019-05-25 ENCOUNTER — Ambulatory Visit: Payer: BC Managed Care – PPO

## 2019-05-25 ENCOUNTER — Other Ambulatory Visit: Payer: BC Managed Care – PPO

## 2019-06-01 ENCOUNTER — Ambulatory Visit: Payer: BC Managed Care – PPO | Admitting: Hematology & Oncology

## 2019-06-01 ENCOUNTER — Ambulatory Visit: Payer: BC Managed Care – PPO

## 2019-06-01 ENCOUNTER — Other Ambulatory Visit: Payer: BC Managed Care – PPO

## 2019-06-08 ENCOUNTER — Ambulatory Visit: Payer: BC Managed Care – PPO

## 2019-06-08 ENCOUNTER — Other Ambulatory Visit: Payer: BC Managed Care – PPO

## 2019-06-08 MED FILL — PANTOPRAZOLE SOD DR 40 MG T: 40 | 30 days supply | Qty: 30 | Fill #2

## 2019-06-15 MED FILL — NEOMYCIN 500 MG TABLET: 500 | 1 days supply | Qty: 6 | Fill #0

## 2019-06-15 MED FILL — ERYTHROMYCIN BASE 500 MG TA: 500 | 1 days supply | Qty: 6 | Fill #0

## 2019-06-30 MED FILL — ENOXAPARIN SODIUM 40 MG/0.4: 40 | 14 days supply | Qty: 6 | Fill #0

## 2019-07-01 ENCOUNTER — Telehealth: Payer: Self-pay | Admitting: *Deleted

## 2019-07-01 NOTE — Telephone Encounter (Signed)
Received call from Shirlean Mylar, at [facility], needing the Oncotype results. Results faxed to [facility] at [phone]. Also, please tell Dr. Marin Olp that Dr.Shen is ordering Foundation One on the surgical specimen from 06/23/2019. I will relay the message to Dr. Marin Olp.

## 2019-07-06 ENCOUNTER — Telehealth: Payer: Self-pay | Admitting: Hematology & Oncology

## 2019-07-06 ENCOUNTER — Inpatient Hospital Stay: Payer: BC Managed Care – PPO | Attending: Radiation Oncology | Admitting: Hematology & Oncology

## 2019-07-06 ENCOUNTER — Encounter: Payer: Self-pay | Admitting: Hematology & Oncology

## 2019-07-06 ENCOUNTER — Inpatient Hospital Stay: Payer: BC Managed Care – PPO

## 2019-07-06 ENCOUNTER — Other Ambulatory Visit: Payer: Self-pay

## 2019-07-06 VITALS — BP 118/79 | HR 110 | Temp 97.3°F | Resp 19 | Wt 157.0 lb

## 2019-07-06 DIAGNOSIS — Z923 Personal history of irradiation: Secondary | ICD-10-CM | POA: Diagnosis not present

## 2019-07-06 DIAGNOSIS — C189 Malignant neoplasm of colon, unspecified: Secondary | ICD-10-CM

## 2019-07-06 DIAGNOSIS — D500 Iron deficiency anemia secondary to blood loss (chronic): Secondary | ICD-10-CM

## 2019-07-06 DIAGNOSIS — Z933 Colostomy status: Secondary | ICD-10-CM | POA: Diagnosis not present

## 2019-07-06 DIAGNOSIS — E611 Iron deficiency: Secondary | ICD-10-CM | POA: Insufficient documentation

## 2019-07-06 DIAGNOSIS — D62 Acute posthemorrhagic anemia: Secondary | ICD-10-CM

## 2019-07-06 DIAGNOSIS — Z9221 Personal history of antineoplastic chemotherapy: Secondary | ICD-10-CM | POA: Diagnosis not present

## 2019-07-06 LAB — RETICULOCYTES
Immature Retic Fract: 29.9 % — ABNORMAL HIGH (ref 2.3–15.9)
RBC.: 3.16 MIL/uL — ABNORMAL LOW (ref 3.87–5.11)
Retic Count, Absolute: 121.7 K/uL (ref 19.0–186.0)
Retic Ct Pct: 3.9 % — ABNORMAL HIGH (ref 0.4–3.1)

## 2019-07-06 LAB — CBC WITH DIFFERENTIAL (CANCER CENTER ONLY)
Abs Immature Granulocytes: 0.1 K/uL — ABNORMAL HIGH (ref 0.00–0.07)
Basophils Absolute: 0.1 K/uL (ref 0.0–0.1)
Basophils Relative: 0 %
Eosinophils Absolute: 0.4 K/uL (ref 0.0–0.5)
Eosinophils Relative: 2 %
HCT: 27.9 % — ABNORMAL LOW (ref 36.0–46.0)
Hemoglobin: 9.2 g/dL — ABNORMAL LOW (ref 12.0–15.0)
Immature Granulocytes: 1 %
Lymphocytes Relative: 5 %
Lymphs Abs: 1 K/uL (ref 0.7–4.0)
MCH: 29.4 pg (ref 26.0–34.0)
MCHC: 33 g/dL (ref 30.0–36.0)
MCV: 89.1 fL (ref 80.0–100.0)
Monocytes Absolute: 0.7 K/uL (ref 0.1–1.0)
Monocytes Relative: 3 %
Neutro Abs: 17.9 K/uL — ABNORMAL HIGH (ref 1.7–7.7)
Neutrophils Relative %: 89 %
Platelet Count: 559 K/uL — ABNORMAL HIGH (ref 150–400)
RBC: 3.13 MIL/uL — ABNORMAL LOW (ref 3.87–5.11)
RDW: 14.1 % (ref 11.5–15.5)
WBC Count: 20.2 K/uL — ABNORMAL HIGH (ref 4.0–10.5)
nRBC: 0 % (ref 0.0–0.2)

## 2019-07-06 LAB — CMP (CANCER CENTER ONLY)
ALT: 25 U/L (ref 0–44)
AST: 18 U/L (ref 15–41)
Albumin: 3.6 g/dL (ref 3.5–5.0)
Alkaline Phosphatase: 247 U/L — ABNORMAL HIGH (ref 38–126)
Anion gap: 11 (ref 5–15)
BUN: 11 mg/dL (ref 6–20)
CO2: 22 mmol/L (ref 22–32)
Calcium: 9.2 mg/dL (ref 8.9–10.3)
Chloride: 104 mmol/L (ref 98–111)
Creatinine: 0.88 mg/dL (ref 0.44–1.00)
GFR, Est AFR Am: >60 mL/min (ref >60)
GFR, Estimated: >60 mL/min (ref >60)
Glucose, Bld: 129 mg/dL — ABNORMAL HIGH (ref 70–99)
Potassium: 3.6 mmol/L (ref 3.5–5.1)
Sodium: 137 mmol/L (ref 135–145)
Total Bilirubin: 0.3 mg/dL (ref 0.3–1.2)
Total Protein: 6.5 g/dL (ref 6.5–8.1)

## 2019-07-06 LAB — IRON AND TIBC
Iron: 14 ug/dL — ABNORMAL LOW (ref 41–142)
Saturation Ratios: 5 % — ABNORMAL LOW (ref 21–57)
TIBC: 251 ug/dL (ref 236–444)
UIBC: 238 ug/dL (ref 120–384)

## 2019-07-06 LAB — LACTATE DEHYDROGENASE: LDH: 171 U/L (ref 98–192)

## 2019-07-06 LAB — SAMPLE TO BLOOD BANK

## 2019-07-06 LAB — FERRITIN: Ferritin: 978 ng/mL — ABNORMAL HIGH (ref 11–307)

## 2019-07-06 MED ORDER — SODIUM CHLORIDE 0.9% FLUSH
3.0000 mL | Freq: Once | INTRAVENOUS | Status: AC | PRN
  Administered 2019-07-06: 09:00:00 10 mL
  Filled 2019-07-06: qty 10

## 2019-07-06 MED ORDER — HEPARIN SOD (PORK) LOCK FLUSH 100 UNIT/ML IV SOLN
250.0000 [IU] | Freq: Once | INTRAVENOUS | Status: AC | PRN
  Administered 2019-07-06: 250 [IU]
  Filled 2019-07-06: qty 5

## 2019-07-06 NOTE — Telephone Encounter (Signed)
Called and spoke with patient about iron infusion appointments being added.  She was ok with both date/time per 4/13 result note

## 2019-07-06 NOTE — Patient Instructions (Signed)
Implanted Port Insertion, Care After °This sheet gives you information about how to care for yourself after your procedure. Your health care provider may also give you more specific instructions. If you have problems or questions, contact your health care provider. °What can I expect after the procedure? °After the procedure, it is common to have: °· Discomfort at the port insertion site. °· Bruising on the skin over the port. This should improve over 3-4 days. °Follow these instructions at home: °Port care °· After your port is placed, you will get a manufacturer's information card. The card has information about your port. Keep this card with you at all times. °· Take care of the port as told by your health care provider. Ask your health care provider if you or a family member can get training for taking care of the port at home. A home health care nurse may also take care of the port. °· Make sure to remember what type of port you have. °Incision care ° °  ° °· Follow instructions from your health care provider about how to take care of your port insertion site. Make sure you: °? Wash your hands with soap and water before and after you change your bandage (dressing). If soap and water are not available, use hand sanitizer. °? Change your dressing as told by your health care provider. °? Leave stitches (sutures), skin glue, or adhesive strips in place. These skin closures may need to stay in place for 2 weeks or longer. If adhesive strip edges start to loosen and curl up, you may trim the loose edges. Do not remove adhesive strips completely unless your health care provider tells you to do that. °· Check your port insertion site every day for signs of infection. Check for: °? Redness, swelling, or pain. °? Fluid or blood. °? Warmth. °? Pus or a bad smell. °Activity °· Return to your normal activities as told by your health care provider. Ask your health care provider what activities are safe for you. °· Do not  lift anything that is heavier than 10 lb (4.5 kg), or the limit that you are told, until your health care provider says that it is safe. °General instructions °· Take over-the-counter and prescription medicines only as told by your health care provider. °· Do not take baths, swim, or use a hot tub until your health care provider approves. Ask your health care provider if you may take showers. You may only be allowed to take sponge baths. °· Do not drive for 24 hours if you were given a sedative during your procedure. °· Wear a medical alert bracelet in case of an emergency. This will tell any health care providers that you have a port. °· Keep all follow-up visits as told by your health care provider. This is important. °Contact a health care provider if: °· You cannot flush your port with saline as directed, or you cannot draw blood from the port. °· You have a fever or chills. °· You have redness, swelling, or pain around your port insertion site. °· You have fluid or blood coming from your port insertion site. °· Your port insertion site feels warm to the touch. °· You have pus or a bad smell coming from the port insertion site. °Get help right away if: °· You have chest pain or shortness of breath. °· You have bleeding from your port that you cannot control. °Summary °· Take care of the port as told by your health   care provider. Keep the manufacturer's information card with you at all times. °· Change your dressing as told by your health care provider. °· Contact a health care provider if you have a fever or chills or if you have redness, swelling, or pain around your port insertion site. °· Keep all follow-up visits as told by your health care provider. °This information is not intended to replace advice given to you by your health care provider. Make sure you discuss any questions you have with your health care provider. °Document Revised: 10/07/2017 Document Reviewed: 10/07/2017 °Elsevier Patient Education ©  2020 [employer] ° °

## 2019-07-06 NOTE — Telephone Encounter (Signed)
Return for I will call her for f/u.per 4/13 los

## 2019-07-06 NOTE — Progress Notes (Signed)
Hematology and Oncology Follow Up Visit  Carla Mills [id number] [birth year] 49 y.o. 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 As always she looks quite good.  Surprisingly, she just had her incredibly aggressive and lengthy pelvic exenteration surgery at [facility] about 10 days ago or so.  I must say that Dr. Crisoforo Oxford did an incredible job.  I talked to him today.  The surgery took about 10 hours.  Multiple surgeons were involved.  He said that he got out all that he could see although he is sure that there is still residual tumor, particularly that is stuck along her sacrum.  She has a urostomy bag.  She has had the colostomy bag for a couple years.  We had the tumor sent off for Foundation 1 analysis.  This probably will take a couple weeks.  Clearly, we have a very difficult problem.  The CEA has never been elevated.  I do not know she even has measurable disease.  This might make doing a clinical trial difficult.  I will make a call in to my colorectal cancer expert down at [place].  We will see if there is any suggestion that she may have.  I know that Ms. Scatena has not had Stivarga or Lonsurf.  I think these will be 2 reasonable options.  We really want him to try to make sure that she saves her hair.  This will be key.  She is eating okay.  She is not having problems with pain.  She  is actually much more active than I would have thought.  She was transfused with I think 4 units of blood and 7 units of plasma with the procedure.  I was sure that this was an incredibly aggressive surgery and that bleeding probably would have been an issue.  There has been no fever.  She is still taking Lovenox injections for DVT prophylaxis.  Overall, I would have to say that her performance status is ECOG 1.     Allergies as of 07/06/2019      Reactions   Bactrim [sulfamethoxazole-trimethoprim] Swelling, Other (See Comments)   Severe generalized swelling   Capecitabine Hives, Other (See Comments)   Legs and arms, after completing therapy   Chlorhexidine Rash   Oxaprozin Hives   Day pro   Penicillins Hives, Other (See Comments)   Has patient had a PCN reaction causing immediate rash, facial/tongue/throat swelling, SOB or lightheadedness with hypotension: no Has patient had a PCN reaction causing severe rash involving mucus membranes or skin necrosis: {no Has patient had a PCN reaction that required hospitalization no Has patient had a PCN reaction occurring within the last 10 years: no If all of the above answers are "NO", then may proceed with Cephalosporin use.      Medication List       Accurate as of July 06, 2019  1:01 PM. If you have  any questions, ask your nurse or doctor.        STOP taking these medications   HYDROcodone-acetaminophen 7.5-325 MG tablet Commonly known as: NORCO Stopped by: Volanda Napoleon, MD   loperamide 2 MG tablet Commonly known as: Imodium A-D Stopped by: Volanda Napoleon, MD   metoCLOPramide 10 MG tablet Commonly known as: Reglan Stopped by: Volanda Napoleon, MD   polyethylene glycol 17 g packet Commonly known as: MIRALAX / GLYCOLAX Stopped by: Volanda Napoleon, MD     TAKE these medications   cetirizine 10 MG tablet Commonly known as: ZYRTEC Take 10 mg by mouth daily.   dexamethasone 4 MG tablet Commonly known as: DECADRON Take 2  tablets (8 mg total) by mouth daily. Start the day after chemotherapy for 3 days. Take with food.   enoxaparin 40 MG/0.4ML injection Commonly known as: LOVENOX Inject 40 mg into the skin daily.   fluticasone 50 MCG/ACT nasal spray Commonly known as: FLONASE Place 1 spray into both nostrils 2 (two) times daily.   LORazepam 1 MG tablet Commonly known as: ATIVAN Take 1 tablet (1 mg total) by mouth every 8 (eight) hours.   ondansetron 8 MG tablet Commonly known as: Zofran Take 1 tablet (8 mg total) by mouth 2 (two) times daily as needed. Start on day 3 after chemotherapy.   pantoprazole 40 MG tablet Commonly known as: Protonix Take 1 tablet (40 mg total) by mouth daily.   prochlorperazine 10 MG tablet Commonly known as: COMPAZINE Take 1 tablet (10 mg total) by mouth every 6 (six) hours as needed (Nausea or vomiting).   Urinary Leg Bag Kit 1 Units by Misc.(Non-Drug; Combo Route) route once a week.   Bard Extension Tubing/Connect Misc 1 Units by [employer].(Non-Drug; Combo Route) route twice a week. Urinary leg bag extension tubing       Allergies:  Allergies  Allergen Reactions  . Bactrim [Sulfamethoxazole-Trimethoprim] Swelling and Other (See Comments)    Severe generalized swelling  . Capecitabine Hives and Other (See Comments)    Legs and arms, after completing therapy  . Chlorhexidine Rash  . Oxaprozin Hives    Day pro  . Penicillins Hives and Other (See Comments)    Has patient had a PCN reaction causing immediate rash, facial/tongue/throat swelling, SOB or lightheadedness with hypotension: no Has patient had a PCN reaction causing severe rash involving mucus membranes or skin necrosis: {no Has patient had a PCN reaction that required hospitalization no Has patient had a PCN reaction occurring within the last 10 years: no If all of the above answers are "NO", then may proceed with Cephalosporin use.    Past Medical History, Surgical history, Social history, and Family  History were reviewed and updated.  Review of Systems: Review of Systems  Constitutional: Negative.   HENT: Negative.   Eyes: Negative.   Respiratory: Negative.   Cardiovascular: Negative.   Gastrointestinal: Negative.   Genitourinary: Negative.   Musculoskeletal: Negative.   Skin: Negative.   Neurological: Negative.   Endo/Heme/Allergies: Negative.   Psychiatric/Behavioral: Negative.       Physical Exam:  weight is 157 lb (71.2 kg). Her temporal temperature is 97.3 F (36.3 C) (abnormal). Her blood pressure is 118/79 and her pulse is 110 (abnormal). Her respiration is 19 and oxygen saturation is 100%.   Wt Readings from Last 3 Encounters:  07/06/19 157 lb (71.2 kg)  05/11/19 158 lb (71.7 kg)  04/27/19 155 lb (70.3 kg)    52month 24 scenario 316  morning man Randall Hiss over 45 her vital signs show a temperature 97.6.  Pulse 76.  Blood pressure 114/75.  Physical Exam Vitals reviewed.  HENT:     Head: Normocephalic and atraumatic.  Eyes:     Pupils: Pupils are equal, round, and reactive to light.  Cardiovascular:     Rate and Rhythm: Normal rate and regular rhythm.     Heart sounds: Normal heart sounds.  Pulmonary:     Effort: Pulmonary effort is normal.     Breath sounds: Normal breath sounds.  Abdominal:     General: Bowel sounds are normal.     Palpations: Abdomen is soft.     Comments: Abdominal exam shows laparotomy scars that are well-healed.  She has a colostomy in the left lower quadrant.  There is a urostomy in the right lower quadrant.  She does have decent bowel sounds.  She has no fluid wave.  There is no guarding or rebound tenderness.  She has no palpable liver or spleen tip.  Musculoskeletal:        General: No tenderness or deformity. Normal range of motion.     Cervical back: Normal range of motion.  Lymphadenopathy:     Cervical: No cervical adenopathy.  Skin:    General: Skin is warm and dry.     Findings: No erythema or rash.  Neurological:      Mental Status: She is alert and oriented to person, place, and time.  Psychiatric:        Behavior: Behavior normal.        Thought Content: Thought content normal.        Judgment: Judgment normal.      Lab Results  Component Value Date   WBC 20.2 (H) 07/06/2019   HGB 9.2 (L) 07/06/2019   HCT 27.9 (L) 07/06/2019   MCV 89.1 07/06/2019   PLT 559 (H) 07/06/2019   Lab Results  Component Value Date   FERRITIN 1,910 (H) 05/11/2019   IRON 68 05/11/2019   TIBC 285 05/11/2019   UIBC 217 05/11/2019   IRONPCTSAT 24 05/11/2019   Lab Results  Component Value Date   RETICCTPCT 3.9 (H) 07/06/2019   RBC 3.13 (L) 07/06/2019   No results found for: KPAFRELGTCHN, LAMBDASER, KAPLAMBRATIO No results found for: IGGSERUM, IGA, IGMSERUM No results found for: Odetta Pink, SPEI   Chemistry      Component Value Date/Time   NA 137 07/06/2019 0848   NA 145 03/27/2017 1101   NA 141 07/30/2016 0750   K 3.6 07/06/2019 0848   K 4.5 03/27/2017 1101   K 4.2 07/30/2016 0750   CL 104 07/06/2019 0848   CL 103 03/27/2017 1101   CO2 22 07/06/2019 0848   CO2 28 03/27/2017 1101   CO2 26 07/30/2016 0750   BUN 11 07/06/2019 0848   BUN 12 03/27/2017 1101   BUN 12.1 07/30/2016 0750   CREATININE 0.88 07/06/2019 0848   CREATININE 0.9 03/27/2017 1101   CREATININE 0.7 07/30/2016 0750      Component Value Date/Time   CALCIUM 9.2 07/06/2019 0848   CALCIUM 9.6 03/27/2017 1101   CALCIUM 9.5 07/30/2016 0750   ALKPHOS 247 (H) 07/06/2019 0848   ALKPHOS 101 (H) 03/27/2017 1101   ALKPHOS 72 07/30/2016 0750   AST 18 07/06/2019 0848   AST 22 07/30/2016 0750   ALT 25 07/06/2019 0848   ALT 20 03/27/2017 1101   ALT 22 07/30/2016 0750   BILITOT  0.3 07/06/2019 0848   BILITOT 0.54 07/30/2016 0750       Impression and Plan: Ms. Luckett is a very pleasant 49 yo caucasian female with recurrent colon cancer - HER2 (-)/KRAS (+)/MSI stable/MMR normal/BRAF -.    Will be very interesting to see with the Foundation 1 studies show.  So far, every time we send off next generation sequencing, we really do not have anything that we can target.  Again I am not sure if she would qualify for clinical trial since there probably will not be measurable disease that can be seen.  We cannot use a CEA level since this is never been elevated.  It is too early to do a PET scan on her.  I think this probably would be a good idea.  I just think it is too early to do this and have it may be false positive results.  For right now, we will have to see what her iron studies look like.  She might need some IV iron.  I spent about an hour with she and her husband.  I know this is an incredibly difficult situation for her.  She has done so well.  She is done as much as we could ask of her.  She is so motivated given that she has 3 daughters.  She still has a good performance status so she definitely would be a candidate for further therapy.  We will have to see about Stivarga.  I think this would be reasonable for her.  I will await the results of the molecular studies and then we will get her back.  Volanda Napoleon, MD 4/13/20211:01 PM

## 2019-07-07 ENCOUNTER — Other Ambulatory Visit: Payer: Self-pay | Admitting: Family

## 2019-07-09 ENCOUNTER — Other Ambulatory Visit: Payer: Self-pay

## 2019-07-09 ENCOUNTER — Inpatient Hospital Stay: Payer: BC Managed Care – PPO

## 2019-07-09 VITALS — BP 108/57 | HR 89 | Temp 97.5°F | Resp 17

## 2019-07-09 DIAGNOSIS — D62 Acute posthemorrhagic anemia: Secondary | ICD-10-CM

## 2019-07-09 DIAGNOSIS — D500 Iron deficiency anemia secondary to blood loss (chronic): Secondary | ICD-10-CM

## 2019-07-09 DIAGNOSIS — C189 Malignant neoplasm of colon, unspecified: Secondary | ICD-10-CM | POA: Diagnosis not present

## 2019-07-09 MED ORDER — SODIUM CHLORIDE 0.9 % IV SOLN
Freq: Once | INTRAVENOUS | Status: AC
  Filled 2019-07-09: qty 250

## 2019-07-09 MED ORDER — SODIUM CHLORIDE 0.9 % IV SOLN
200.0000 mg | Freq: Once | INTRAVENOUS | Status: AC
  Administered 2019-07-09: 200 mg via INTRAVENOUS
  Filled 2019-07-09: qty 200

## 2019-07-09 NOTE — Patient Instructions (Signed)

## 2019-07-09 NOTE — Progress Notes (Signed)
Pharmacist Chemotherapy Monitoring - Follow Up Assessment    I verify that I have reviewed each item in the below checklist:  . Regimen for the patient is scheduled for the appropriate day and plan matches scheduled date. Marland Kitchen Appropriate non-routine labs are ordered dependent on drug ordered. . If applicable, additional medications reviewed and ordered per protocol based on lifetime cumulative doses and/or treatment regimen.   Plan for follow-up and/or issues identified: No . I-vent associated with next due treatment: No . MD and/or nursing notified: No  Danton Palmateer, Jacqlyn Larsen 07/09/2019 8:05 AM

## 2019-07-13 MED FILL — PANTOPRAZOLE SOD DR 40 MG T: 40 | 30 days supply | Qty: 30 | Fill #3

## 2019-07-16 ENCOUNTER — Other Ambulatory Visit: Payer: Self-pay

## 2019-07-16 ENCOUNTER — Inpatient Hospital Stay: Payer: BC Managed Care – PPO

## 2019-07-16 VITALS — BP 100/53 | HR 98 | Temp 97.9°F | Resp 17

## 2019-07-16 DIAGNOSIS — D62 Acute posthemorrhagic anemia: Secondary | ICD-10-CM

## 2019-07-16 DIAGNOSIS — D500 Iron deficiency anemia secondary to blood loss (chronic): Secondary | ICD-10-CM

## 2019-07-16 DIAGNOSIS — C189 Malignant neoplasm of colon, unspecified: Secondary | ICD-10-CM | POA: Diagnosis not present

## 2019-07-16 MED ORDER — HEPARIN SOD (PORK) LOCK FLUSH 100 UNIT/ML IV SOLN
250.0000 [IU] | Freq: Once | INTRAVENOUS | Status: AC | PRN
  Administered 2019-07-16: 500 [IU]
  Filled 2019-07-16: qty 5

## 2019-07-16 MED ORDER — SODIUM CHLORIDE 0.9 % IV SOLN
Freq: Once | INTRAVENOUS | Status: AC
  Filled 2019-07-16: qty 250

## 2019-07-16 MED ORDER — SODIUM CHLORIDE 0.9% FLUSH
10.0000 mL | Freq: Once | INTRAVENOUS | Status: AC | PRN
  Administered 2019-07-16: 10 mL
  Filled 2019-07-16: qty 10

## 2019-07-16 MED ORDER — SODIUM CHLORIDE 0.9 % IV SOLN
200.0000 mg | Freq: Once | INTRAVENOUS | Status: AC
  Administered 2019-07-16: 200 mg via INTRAVENOUS
  Filled 2019-07-16: qty 200

## 2019-07-16 NOTE — Patient Instructions (Signed)
Anemia  Anemia is a condition in which you do not have enough red blood cells or hemoglobin. Hemoglobin is a substance in red blood cells that carries oxygen. When you do not have enough red blood cells or hemoglobin (are anemic), your body cannot get enough oxygen and your organs may not work properly. As a result, you may feel very tired or have other problems. What are the causes? Common causes of anemia include:  Excessive bleeding. Anemia can be caused by excessive bleeding inside or outside the body, including bleeding from the intestine or from periods in women.  Poor nutrition.  Long-lasting (chronic) kidney, thyroid, and liver disease.  Bone marrow disorders.  Cancer and treatments for cancer.  HIV (human immunodeficiency virus) and AIDS (acquired immunodeficiency syndrome).  Treatments for HIV and AIDS.  Spleen problems.  Blood disorders.  Infections, medicines, and autoimmune disorders that destroy red blood cells. What are the signs or symptoms? Symptoms of this condition include:  Minor weakness.  Dizziness.  Headache.  Feeling heartbeats that are irregular or faster than normal (palpitations).  Shortness of breath, especially with exercise.  Paleness.  Cold sensitivity.  Indigestion.  Nausea.  Difficulty sleeping.  Difficulty concentrating. Symptoms may occur suddenly or develop slowly. If your anemia is mild, you may not have symptoms. How is this diagnosed? This condition is diagnosed based on:  Blood tests.  Your medical history.  A physical exam.  Bone marrow biopsy. Your health care provider may also check your stool (feces) for blood and may do additional testing to look for the cause of your bleeding. You may also have other tests, including:  Imaging tests, such as a CT scan or MRI.  Endoscopy.  Colonoscopy. How is this treated? Treatment for this condition depends on the cause. If you continue to lose a lot of blood, you may  need to be treated at a hospital. Treatment may include:  Taking supplements of iron, vitamin S31, or folic acid.  Taking a hormone medicine (erythropoietin) that can help to stimulate red blood cell growth.  Having a blood transfusion. This may be needed if you lose a lot of blood.  Making changes to your diet.  Having surgery to remove your spleen. Follow these instructions at home:  Take over-the-counter and prescription medicines only as told by your health care provider.  Take supplements only as told by your health care provider.  Follow any diet instructions that you were given.  Keep all follow-up visits as told by your health care provider. This is important. Contact a health care provider if:  You develop new bleeding anywhere in the body. Get help right away if:  You are very weak.  You are short of breath.  You have pain in your abdomen or chest.  You are dizzy or feel faint.  You have trouble concentrating.  You have bloody or black, tarry stools.  You vomit repeatedly or you vomit up blood. Summary  Anemia is a condition in which you do not have enough red blood cells or enough of a substance in your red blood cells that carries oxygen (hemoglobin).  Symptoms may occur suddenly or develop slowly.  If your anemia is mild, you may not have symptoms.  This condition is diagnosed with blood tests as well as a medical history and physical exam. Other tests may be needed.  Treatment for this condition depends on the cause of the anemia. This information is not intended to replace advice given to you by  your health care provider. Make sure you discuss any questions you have with your health care provider. Document Revised: 02/21/2017 Document Reviewed: 04/12/2016 Elsevier Patient Education  [place].

## 2019-07-19 ENCOUNTER — Other Ambulatory Visit: Payer: Self-pay | Admitting: Surgery

## 2019-08-02 ENCOUNTER — Inpatient Hospital Stay: Payer: BC Managed Care – PPO | Attending: Radiation Oncology

## 2019-08-02 ENCOUNTER — Other Ambulatory Visit: Payer: Self-pay

## 2019-08-02 DIAGNOSIS — Z933 Colostomy status: Secondary | ICD-10-CM | POA: Insufficient documentation

## 2019-08-02 DIAGNOSIS — Z79899 Other long term (current) drug therapy: Secondary | ICD-10-CM | POA: Insufficient documentation

## 2019-08-02 DIAGNOSIS — Z7901 Long term (current) use of anticoagulants: Secondary | ICD-10-CM | POA: Insufficient documentation

## 2019-08-02 DIAGNOSIS — C189 Malignant neoplasm of colon, unspecified: Secondary | ICD-10-CM | POA: Insufficient documentation

## 2019-08-05 ENCOUNTER — Telehealth: Payer: Self-pay | Admitting: Hematology & Oncology

## 2019-08-05 NOTE — Telephone Encounter (Signed)
Called and spoke with patient regarding appointments added.  She was ok with both date/time

## 2019-08-12 MED FILL — PANTOPRAZOLE SOD DR 40 MG T: 40 | 30 days supply | Qty: 30 | Fill #4

## 2019-08-13 ENCOUNTER — Telehealth: Payer: Self-pay | Admitting: Hematology & Oncology

## 2019-08-13 ENCOUNTER — Inpatient Hospital Stay: Payer: BC Managed Care – PPO

## 2019-08-13 ENCOUNTER — Other Ambulatory Visit: Payer: Self-pay

## 2019-08-13 ENCOUNTER — Inpatient Hospital Stay (HOSPITAL_BASED_OUTPATIENT_CLINIC_OR_DEPARTMENT_OTHER): Payer: BC Managed Care – PPO | Admitting: Hematology & Oncology

## 2019-08-13 ENCOUNTER — Encounter: Payer: Self-pay | Admitting: Hematology & Oncology

## 2019-08-13 VITALS — BP 110/65 | HR 76 | Temp 97.3°F | Resp 16 | Wt 160.0 lb

## 2019-08-13 DIAGNOSIS — Z933 Colostomy status: Secondary | ICD-10-CM | POA: Diagnosis not present

## 2019-08-13 DIAGNOSIS — C189 Malignant neoplasm of colon, unspecified: Secondary | ICD-10-CM

## 2019-08-13 DIAGNOSIS — Z79899 Other long term (current) drug therapy: Secondary | ICD-10-CM | POA: Diagnosis not present

## 2019-08-13 DIAGNOSIS — Z7901 Long term (current) use of anticoagulants: Secondary | ICD-10-CM | POA: Diagnosis not present

## 2019-08-13 LAB — CMP (CANCER CENTER ONLY)
ALT: 8 U/L (ref 0–44)
AST: 12 U/L — ABNORMAL LOW (ref 15–41)
Albumin: 4.2 g/dL (ref 3.5–5.0)
Alkaline Phosphatase: 110 U/L (ref 38–126)
Anion gap: 9 (ref 5–15)
BUN: 23 mg/dL — ABNORMAL HIGH (ref 6–20)
CO2: 26 mmol/L (ref 22–32)
Calcium: 9.8 mg/dL (ref 8.9–10.3)
Chloride: 106 mmol/L (ref 98–111)
Creatinine: 0.95 mg/dL (ref 0.44–1.00)
GFR, Est AFR Am: >60 mL/min (ref >60)
GFR, Estimated: >60 mL/min (ref >60)
Glucose, Bld: 84 mg/dL (ref 70–99)
Potassium: 4 mmol/L (ref 3.5–5.1)
Sodium: 141 mmol/L (ref 135–145)
Total Bilirubin: 0.3 mg/dL (ref 0.3–1.2)
Total Protein: 7.6 g/dL (ref 6.5–8.1)

## 2019-08-13 LAB — CBC WITH DIFFERENTIAL (CANCER CENTER ONLY)
Abs Immature Granulocytes: 0.03 10*3/uL (ref 0.00–0.07)
Basophils Absolute: 0.1 10*3/uL (ref 0.0–0.1)
Basophils Relative: 1 %
Eosinophils Absolute: 0.5 10*3/uL (ref 0.0–0.5)
Eosinophils Relative: 6 %
HCT: 38 % (ref 36.0–46.0)
Hemoglobin: 11.9 g/dL — ABNORMAL LOW (ref 12.0–15.0)
Immature Granulocytes: 0 %
Lymphocytes Relative: 16 %
Lymphs Abs: 1.3 10*3/uL (ref 0.7–4.0)
MCH: 27.4 pg (ref 26.0–34.0)
MCHC: 31.3 g/dL (ref 30.0–36.0)
MCV: 87.6 fL (ref 80.0–100.0)
Monocytes Absolute: 0.5 10*3/uL (ref 0.1–1.0)
Monocytes Relative: 6 %
Neutro Abs: 5.9 10*3/uL (ref 1.7–7.7)
Neutrophils Relative %: 71 %
Platelet Count: 331 10*3/uL (ref 150–400)
RBC: 4.34 MIL/uL (ref 3.87–5.11)
RDW: 14.9 % (ref 11.5–15.5)
WBC Count: 8.4 10*3/uL (ref 4.0–10.5)
nRBC: 0 % (ref 0.0–0.2)

## 2019-08-13 LAB — LACTATE DEHYDROGENASE: LDH: 142 U/L (ref 98–192)

## 2019-08-13 NOTE — Progress Notes (Signed)
Hematology and Oncology Follow Up Visit  Carla Mills [id number] Nov 11, 1970 48 y.o. 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  She really looks fantastic.  She unfortunately had an infection at the incision site.  She had this drained.  She has a packing in right now.  This does seem to be improving.  She does not need to put as much packing in.  She is on clindamycin and ciprofloxacin.  She said that cultures were not taken of the discharge.  She is going to see one of the GI oncologist at [facility] on Monday.  He will be a nurse and is here what he has to say.  I think that since she has no measurable disease, that it would be difficult to treat her right now.  I think that quality of life is critical.  I think that whenever we treat her with, she would clearly have a toxicity.  And since we do not know what we are treating, we would not know if things are working.  We cannot use her CEA level as this is always been normal.  Of note, we did do the molecular markers.  She does  have the K-ras mutation.  She does not have an NRAS mutation.  She is eating well.  She has a urostomy bag.  She has a colostomy bag.  She still has stents for the ureter.  Hopefully these are coming out soon.  She has had no problems with bleeding.  Is no leg swelling.  She had no cough or shortness of breath.  Currently, her  performance status is ECOG 0.   Allergies as of 08/13/2019      Reactions   Bactrim [sulfamethoxazole-trimethoprim] Swelling, Other (See Comments)   Severe generalized swelling   Capecitabine Hives, Other (See Comments)   Legs and arms, after completing therapy   Chlorhexidine Rash   Oxaprozin Hives   Day pro   Penicillins Hives, Other (See Comments)   Has patient had a PCN reaction causing immediate rash, facial/tongue/throat swelling, SOB or lightheadedness with hypotension: no Has patient had a PCN reaction causing severe rash involving mucus membranes or skin necrosis: {no Has patient had a PCN reaction that required hospitalization no Has patient had a PCN reaction occurring within the last 10 years: no If all of the above answers are "NO", then may proceed with Cephalosporin use.      Medication List       Accurate as of Aug 13, 2019  9:23 AM. If you have any questions, ask your nurse or doctor.        cetirizine 10 MG tablet Commonly known as: ZYRTEC Take 10 mg by mouth daily.   ciprofloxacin 500 MG tablet Commonly known as: CIPRO Take 500 mg by mouth 2 (two) times daily.   clindamycin 150 MG capsule Commonly known as: CLEOCIN Take 150 mg by mouth 3 (three) times daily.  dexamethasone 4 MG tablet Commonly known as: DECADRON Take 2 tablets (8 mg total) by mouth daily. Start the day after chemotherapy for 3 days. Take with food.   enoxaparin 40 MG/0.4ML injection Commonly known as: LOVENOX Inject 40 mg into the skin daily.   fluticasone 50 MCG/ACT nasal spray Commonly known as: FLONASE Place 1 spray into both nostrils 2 (two) times daily.   LORazepam 1 MG tablet Commonly known as: ATIVAN Take 1 tablet (1 mg total) by mouth every 8 (eight) hours.   ondansetron 8 MG tablet Commonly known as: Zofran Take 1 tablet (8 mg total) by mouth 2 (two) times daily as needed. Start on day 3 after chemotherapy.   pantoprazole 40 MG tablet Commonly known as:  Protonix Take 1 tablet (40 mg total) by mouth daily.   prochlorperazine 10 MG tablet Commonly known as: COMPAZINE Take 1 tablet (10 mg total) by mouth every 6 (six) hours as needed (Nausea or vomiting).   Urinary Leg Bag Kit 1 Units by Misc.(Non-Drug; Combo Route) route once a week.   Bard Extension Tubing/Connect Misc 1 Units by [employer].(Non-Drug; Combo Route) route twice a week. Urinary leg bag extension tubing       Allergies:  Allergies  Allergen Reactions  . Bactrim [Sulfamethoxazole-Trimethoprim] Swelling and Other (See Comments)    Severe generalized swelling  . Capecitabine Hives and Other (See Comments)    Legs and arms, after completing therapy  . Chlorhexidine Rash  . Oxaprozin Hives    Day pro  . Penicillins Hives and Other (See Comments)    Has patient had a PCN reaction causing immediate rash, facial/tongue/throat swelling, SOB or lightheadedness with hypotension: no Has patient had a PCN reaction causing severe rash involving mucus membranes or skin necrosis: {no Has patient had a PCN reaction that required hospitalization no Has patient had a PCN reaction occurring within the last 10 years: no If all of the above answers are "NO", then may proceed with Cephalosporin use.    Past Medical History, Surgical history, Social history, and Family History were reviewed and updated.  Review of Systems: Review of Systems  Constitutional: Negative.   HENT: Negative.   Eyes: Negative.   Respiratory: Negative.   Cardiovascular: Negative.   Gastrointestinal: Negative.   Genitourinary: Negative.   Musculoskeletal: Negative.   Skin: Negative.   Neurological: Negative.   Endo/Heme/Allergies: Negative.   Psychiatric/Behavioral: Negative.       Physical Exam:  weight is 160 lb (72.6 kg). Her temporal temperature is 97.3 F (36.3 C) (abnormal). Her blood pressure is 110/65 and her pulse is 76. Her respiration is 16 and oxygen saturation is 100%.   Wt Readings from  Last 3 Encounters:  08/13/19 160 lb (72.6 kg)  07/06/19 157 lb (71.2 kg)  05/11/19 158 lb (71.7 kg)    47month 24 scenario 316 morning man Eric over 45 her vital signs show a temperature 97.6.  Pulse 76.  Blood pressure 114/75.  Physical Exam Vitals reviewed.  HENT:     Head: Normocephalic and atraumatic.  Eyes:     Pupils: Pupils are equal, round, and reactive to light.  Cardiovascular:     Rate and Rhythm: Normal rate and regular rhythm.     Heart sounds: Normal heart sounds.  Pulmonary:     Effort: Pulmonary effort is normal.     Breath sounds: Normal breath sounds.  Abdominal:     General: Bowel sounds are normal.     Palpations: Abdomen is soft.  Comments: Abdominal exam shows laparotomy scars that are well-healed.  She has a colostomy in the left lower quadrant.  There is a urostomy in the right lower quadrant.  She does have decent bowel sounds.  She has no fluid wave.  There is no guarding or rebound tenderness.  She has no palpable liver or spleen tip.  Musculoskeletal:        General: No tenderness or deformity. Normal range of motion.     Cervical back: Normal range of motion.  Lymphadenopathy:     Cervical: No cervical adenopathy.  Skin:    General: Skin is warm and dry.     Findings: No erythema or rash.  Neurological:     Mental Status: She is alert and oriented to person, place, and time.  Psychiatric:        Behavior: Behavior normal.        Thought Content: Thought content normal.        Judgment: Judgment normal.      Lab Results  Component Value Date   WBC 20.2 (H) 07/06/2019   HGB 9.2 (L) 07/06/2019   HCT 27.9 (L) 07/06/2019   MCV 89.1 07/06/2019   PLT 559 (H) 07/06/2019   Lab Results  Component Value Date   FERRITIN 978 (H) 07/06/2019   IRON 14 (L) 07/06/2019   TIBC 251 07/06/2019   UIBC 238 07/06/2019   IRONPCTSAT 5 (L) 07/06/2019   Lab Results  Component Value Date   RETICCTPCT 3.9 (H) 07/06/2019   RBC 3.13 (L) 07/06/2019   No  results found for: KPAFRELGTCHN, LAMBDASER, KAPLAMBRATIO No results found for: Kandis Cocking, IGMSERUM No results found for: Odetta Pink, SPEI   Chemistry      Component Value Date/Time   NA 137 07/06/2019 0848   NA 145 03/27/2017 1101   NA 141 07/30/2016 0750   K 3.6 07/06/2019 0848   K 4.5 03/27/2017 1101   K 4.2 07/30/2016 0750   CL 104 07/06/2019 0848   CL 103 03/27/2017 1101   CO2 22 07/06/2019 0848   CO2 28 03/27/2017 1101   CO2 26 07/30/2016 0750   BUN 11 07/06/2019 0848   BUN 12 03/27/2017 1101   BUN 12.1 07/30/2016 0750   CREATININE 0.88 07/06/2019 0848   CREATININE 0.9 03/27/2017 1101   CREATININE 0.7 07/30/2016 0750      Component Value Date/Time   CALCIUM 9.2 07/06/2019 0848   CALCIUM 9.6 03/27/2017 1101   CALCIUM 9.5 07/30/2016 0750   ALKPHOS 247 (H) 07/06/2019 0848   ALKPHOS 101 (H) 03/27/2017 1101   ALKPHOS 72 07/30/2016 0750   AST 18 07/06/2019 0848   AST 22 07/30/2016 0750   ALT 25 07/06/2019 0848   ALT 20 03/27/2017 1101   ALT 22 07/30/2016 0750   BILITOT 0.3 07/06/2019 0848   BILITOT 0.54 07/30/2016 0750       Impression and Plan: Ms. Derringer is a very pleasant 49 yo caucasian female with recurrent colon cancer - HER2 (-)/KRAS (+)/MSI stable/MMR normal/BRAF -.   From my perspective, I really think that we need to just watch her.  I just do not think that treating her right now is going to help her or help her quality of life.  Her quality life is good right now.    I really think that we should just watch her right now.  She is being followed closely by both [place] and [facility].  I would like  to probably see her back in 6 weeks.     Volanda Napoleon, MD 5/21/20219:23 AM

## 2019-08-13 NOTE — Telephone Encounter (Signed)
Appointments scheduled patient will get updates from My Chart per 5/21 los

## 2019-08-26 MED FILL — CIPROFLOXACIN HCL 500 MG TA: 500 | 1 days supply | Qty: 2 | Fill #0

## 2019-09-02 ENCOUNTER — Other Ambulatory Visit: Payer: Self-pay | Admitting: Hematology & Oncology

## 2019-09-13 MED FILL — PANTOPRAZOLE SOD DR 40 MG T: 40 | 30 days supply | Qty: 30 | Fill #5

## 2019-09-24 ENCOUNTER — Encounter: Payer: Self-pay | Admitting: Hematology & Oncology

## 2019-09-24 ENCOUNTER — Inpatient Hospital Stay: Payer: BC Managed Care – PPO | Attending: Radiation Oncology

## 2019-09-24 ENCOUNTER — Inpatient Hospital Stay: Payer: BC Managed Care – PPO

## 2019-09-24 ENCOUNTER — Inpatient Hospital Stay (HOSPITAL_BASED_OUTPATIENT_CLINIC_OR_DEPARTMENT_OTHER): Payer: BC Managed Care – PPO | Admitting: Hematology & Oncology

## 2019-09-24 VITALS — BP 100/71 | HR 88 | Temp 98.1°F | Resp 18 | Wt 163.0 lb

## 2019-09-24 DIAGNOSIS — Z79899 Other long term (current) drug therapy: Secondary | ICD-10-CM | POA: Diagnosis not present

## 2019-09-24 DIAGNOSIS — Z933 Colostomy status: Secondary | ICD-10-CM | POA: Diagnosis not present

## 2019-09-24 DIAGNOSIS — D62 Acute posthemorrhagic anemia: Secondary | ICD-10-CM | POA: Diagnosis not present

## 2019-09-24 DIAGNOSIS — Z7901 Long term (current) use of anticoagulants: Secondary | ICD-10-CM | POA: Insufficient documentation

## 2019-09-24 DIAGNOSIS — Z9221 Personal history of antineoplastic chemotherapy: Secondary | ICD-10-CM | POA: Insufficient documentation

## 2019-09-24 DIAGNOSIS — Z923 Personal history of irradiation: Secondary | ICD-10-CM | POA: Insufficient documentation

## 2019-09-24 DIAGNOSIS — C189 Malignant neoplasm of colon, unspecified: Secondary | ICD-10-CM | POA: Diagnosis not present

## 2019-09-24 DIAGNOSIS — Z936 Other artificial openings of urinary tract status: Secondary | ICD-10-CM | POA: Insufficient documentation

## 2019-09-24 DIAGNOSIS — D500 Iron deficiency anemia secondary to blood loss (chronic): Secondary | ICD-10-CM

## 2019-09-24 LAB — CBC WITH DIFFERENTIAL (CANCER CENTER ONLY)
Abs Immature Granulocytes: 0.03 10*3/uL (ref 0.00–0.07)
Basophils Absolute: 0.1 10*3/uL (ref 0.0–0.1)
Basophils Relative: 1 %
Eosinophils Absolute: 0.4 10*3/uL (ref 0.0–0.5)
Eosinophils Relative: 6 %
HCT: 38.9 % (ref 36.0–46.0)
Hemoglobin: 12.4 g/dL (ref 12.0–15.0)
Immature Granulocytes: 0 %
Lymphocytes Relative: 15 %
Lymphs Abs: 1.2 10*3/uL (ref 0.7–4.0)
MCH: 27.5 pg (ref 26.0–34.0)
MCHC: 31.9 g/dL (ref 30.0–36.0)
MCV: 86.3 fL (ref 80.0–100.0)
Monocytes Absolute: 0.6 10*3/uL (ref 0.1–1.0)
Monocytes Relative: 7 %
Neutro Abs: 5.7 10*3/uL (ref 1.7–7.7)
Neutrophils Relative %: 71 %
Platelet Count: 324 10*3/uL (ref 150–400)
RBC: 4.51 MIL/uL (ref 3.87–5.11)
RDW: 13.5 % (ref 11.5–15.5)
WBC Count: 7.9 10*3/uL (ref 4.0–10.5)
nRBC: 0 % (ref 0.0–0.2)

## 2019-09-24 LAB — CMP (CANCER CENTER ONLY)
ALT: 17 U/L (ref 0–44)
AST: 18 U/L (ref 15–41)
Albumin: 3.8 g/dL (ref 3.5–5.0)
Alkaline Phosphatase: 136 U/L — ABNORMAL HIGH (ref 38–126)
Anion gap: 8 (ref 5–15)
BUN: 20 mg/dL (ref 6–20)
CO2: 25 mmol/L (ref 22–32)
Calcium: 9.2 mg/dL (ref 8.9–10.3)
Chloride: 107 mmol/L (ref 98–111)
Creatinine: 0.85 mg/dL (ref 0.44–1.00)
GFR, Est AFR Am: >60 mL/min (ref >60)
GFR, Estimated: >60 mL/min (ref >60)
Glucose, Bld: 87 mg/dL (ref 70–99)
Potassium: 3.7 mmol/L (ref 3.5–5.1)
Sodium: 140 mmol/L (ref 135–145)
Total Bilirubin: 0.2 mg/dL — ABNORMAL LOW (ref 0.3–1.2)
Total Protein: 6.9 g/dL (ref 6.5–8.1)

## 2019-09-24 LAB — FERRITIN: Ferritin: 552 ng/mL — ABNORMAL HIGH (ref 11–307)

## 2019-09-24 LAB — IRON AND TIBC
Iron: 60 ug/dL (ref 41–142)
Saturation Ratios: 26 % (ref 21–57)
TIBC: 231 ug/dL — ABNORMAL LOW (ref 236–444)
UIBC: 172 ug/dL (ref 120–384)

## 2019-09-24 LAB — CEA (IN HOUSE-CHCC): CEA (CHCC-In House): 1.83 ng/mL (ref 0.00–5.00)

## 2019-09-24 MED ORDER — HEPARIN SOD (PORK) LOCK FLUSH 100 UNIT/ML IV SOLN
500.0000 [IU] | Freq: Once | INTRAVENOUS | Status: AC | PRN
  Administered 2019-09-24: 500 [IU]
  Filled 2019-09-24: qty 5

## 2019-09-24 MED ORDER — SODIUM CHLORIDE 0.9% FLUSH
10.0000 mL | Freq: Once | INTRAVENOUS | Status: AC | PRN
  Administered 2019-09-24: 10 mL
  Filled 2019-09-24: qty 10

## 2019-09-24 NOTE — Progress Notes (Signed)
Hematology and Oncology Follow Up Visit  Carla Mills [id number] 1970-07-28 49 y.o. 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  She really looks fantastic.  She is going to have a very busy summer.  She and her husband are taking their youngest daughter around to colleges.  I think middle daughter is going to go to some camps.  They are going to go to [place], [place] for the next trip.  After that, they will be going to [place].  She recently saw Dr. Crisoforo Oxford at [facility].  He did a CT scan of her abdomen pelvis.  The fluid collection seems to be getting smaller.  There is no obvious recurrent disease.  Para she is having some discomfort in the rectal area.  She is managing this right now.  She has a urostomy and a colostomy bag on.  Both are working well.  She does watch what she eats.  She has had no cough or shortness of breath.  She has had no leg swelling.  She has had no fever.  She still is holding off on the coronavirus vaccine.  She has a history of the Charcot-Marie-Tooth syndrome.  She is worried that the vaccine might reactivate this.  Overall, her performance status is ECOG 0.    Allergies as of 09/24/2019      Reactions   Bactrim [sulfamethoxazole-trimethoprim] Swelling, Other (See Comments)   Severe generalized swelling   Capecitabine Hives, Other (See Comments)   Legs and arms, after  completing therapy   Chlorhexidine Rash   Oxaprozin Hives   Day pro   Penicillins Hives, Other (See Comments)   Has patient had a PCN reaction causing immediate rash, facial/tongue/throat swelling, SOB or lightheadedness with hypotension: no Has patient had a PCN reaction causing severe rash involving mucus membranes or skin necrosis: {no Has patient had a PCN reaction that required hospitalization no Has patient had a PCN reaction occurring within the last 10 years: no If all of the above answers are "NO", then may proceed with Cephalosporin use.      Medication List       Accurate as of September 24, 2019  8:52 AM. If you have any questions, ask your nurse or doctor.        cetirizine 10 MG tablet Commonly known as: ZYRTEC Take 10 mg by mouth daily.   ciprofloxacin 500 MG tablet Commonly known as: CIPRO Take 500 mg by mouth 2 (two) times daily.   clindamycin 150 MG capsule Commonly known as: CLEOCIN Take 150 mg by mouth 3 (three) times daily.   dexamethasone 4 MG tablet Commonly known as: DECADRON Take 2 tablets (8 mg total) by mouth daily. Start the day after chemotherapy for 3 days. Take with food.   enoxaparin 40 MG/0.4ML injection Commonly known as: LOVENOX Inject 40 mg into the skin daily.   fluticasone 50 MCG/ACT nasal spray Commonly known as: [place]  1 spray into both nostrils 2 (two) times daily.   LORazepam 1 MG tablet Commonly known as: ATIVAN Take 1 tablet (1 mg total) by mouth every 8 (eight) hours.   ondansetron 8 MG tablet Commonly known as: Zofran Take 1 tablet (8 mg total) by mouth 2 (two) times daily as needed. Start on day 3 after chemotherapy.   pantoprazole 40 MG tablet Commonly known as: Protonix Take 1 tablet (40 mg total) by mouth daily.   prochlorperazine 10 MG tablet Commonly known as: COMPAZINE Take 1 tablet (10 mg total) by mouth every 6 (six) hours as needed (Nausea or vomiting).   Urinary Leg Bag Kit 1 Units by Misc.(Non-Drug;  Combo Route) route once a week.   Bard Extension Tubing/Connect Misc 1 Units by [employer].(Non-Drug; Combo Route) route twice a week. Urinary leg bag extension tubing       Allergies:  Allergies  Allergen Reactions  . Bactrim [Sulfamethoxazole-Trimethoprim] Swelling and Other (See Comments)    Severe generalized swelling  . Capecitabine Hives and Other (See Comments)    Legs and arms, after completing therapy  . Chlorhexidine Rash  . Oxaprozin Hives    Day pro  . Penicillins Hives and Other (See Comments)    Has patient had a PCN reaction causing immediate rash, facial/tongue/throat swelling, SOB or lightheadedness with hypotension: no Has patient had a PCN reaction causing severe rash involving mucus membranes or skin necrosis: {no Has patient had a PCN reaction that required hospitalization no Has patient had a PCN reaction occurring within the last 10 years: no If all of the above answers are "NO", then may proceed with Cephalosporin use.    Past Medical History, Surgical history, Social history, and Family History were reviewed and updated.  Review of Systems: Review of Systems  Constitutional: Negative.   HENT: Negative.   Eyes: Negative.   Respiratory: Negative.   Cardiovascular: Negative.   Gastrointestinal: Negative.   Genitourinary: Negative.   Musculoskeletal: Negative.   Skin: Negative.   Neurological: Negative.   Endo/Heme/Allergies: Negative.   Psychiatric/Behavioral: Negative.       Physical Exam:  weight is 163 lb (73.9 kg). Her oral temperature is 98.1 F (36.7 C). Her blood pressure is 100/71 and her pulse is 88. Her respiration is 18 and oxygen saturation is 100%.   Wt Readings from Last 3 Encounters:  09/24/19 163 lb (73.9 kg)  08/13/19 160 lb (72.6 kg)  07/06/19 157 lb (71.2 kg)    37month 24 scenario 316 morning man Eric over 45 her vital signs show a temperature 97.6.  Pulse 76.  Blood pressure 114/75.  Physical Exam Vitals reviewed.  HENT:       Head: Normocephalic and atraumatic.  Eyes:     Pupils: Pupils are equal, round, and reactive to light.  Cardiovascular:     Rate and Rhythm: Normal rate and regular rhythm.     Heart sounds: Normal heart sounds.  Pulmonary:     Effort: Pulmonary effort is normal.     Breath sounds: Normal breath sounds.  Abdominal:     General: Bowel sounds are normal.     Palpations: Abdomen is soft.     Comments: Abdominal exam shows laparotomy scars that are well-healed.  She has a colostomy in the left lower quadrant.  There is a urostomy in the right lower quadrant.  She does have decent bowel sounds.  She has no fluid wave.  There is no guarding or rebound tenderness.  She has no  palpable liver or spleen tip.  Musculoskeletal:        General: No tenderness or deformity. Normal range of motion.     Cervical back: Normal range of motion.  Lymphadenopathy:     Cervical: No cervical adenopathy.  Skin:    General: Skin is warm and dry.     Findings: No erythema or rash.  Neurological:     Mental Status: She is alert and oriented to person, place, and time.  Psychiatric:        Behavior: Behavior normal.        Thought Content: Thought content normal.        Judgment: Judgment normal.      Lab Results  Component Value Date   WBC 7.9 09/24/2019   HGB 12.4 09/24/2019   HCT 38.9 09/24/2019   MCV 86.3 09/24/2019   PLT 324 09/24/2019   Lab Results  Component Value Date   FERRITIN 978 (H) 07/06/2019   IRON 14 (L) 07/06/2019   TIBC 251 07/06/2019   UIBC 238 07/06/2019   IRONPCTSAT 5 (L) 07/06/2019   Lab Results  Component Value Date   RETICCTPCT 3.9 (H) 07/06/2019   RBC 4.51 09/24/2019   No results found for: KPAFRELGTCHN, LAMBDASER, KAPLAMBRATIO No results found for: IGGSERUM, IGA, IGMSERUM No results found for: Kathrynn Ducking, MSPIKE, SPEI   Chemistry      Component Value Date/Time   NA 140 09/24/2019 0826   NA 145 03/27/2017 1101    NA 141 07/30/2016 0750   K 3.7 09/24/2019 0826   K 4.5 03/27/2017 1101   K 4.2 07/30/2016 0750   CL 107 09/24/2019 0826   CL 103 03/27/2017 1101   CO2 25 09/24/2019 0826   CO2 28 03/27/2017 1101   CO2 26 07/30/2016 0750   BUN 20 09/24/2019 0826   BUN 12 03/27/2017 1101   BUN 12.1 07/30/2016 0750   CREATININE 0.85 09/24/2019 0826   CREATININE 0.9 03/27/2017 1101   CREATININE 0.7 07/30/2016 0750      Component Value Date/Time   CALCIUM 9.2 09/24/2019 0826   CALCIUM 9.6 03/27/2017 1101   CALCIUM 9.5 07/30/2016 0750   ALKPHOS 136 (H) 09/24/2019 0826   ALKPHOS 101 (H) 03/27/2017 1101   ALKPHOS 72 07/30/2016 0750   AST 18 09/24/2019 0826   AST 22 07/30/2016 0750   ALT 17 09/24/2019 0826   ALT 20 03/27/2017 1101   ALT 22 07/30/2016 0750   BILITOT 0.2 (L) 09/24/2019 0826   BILITOT 0.54 07/30/2016 0750       Impression and Plan: Carla Mills is a very pleasant 49 yo caucasian female with recurrent colon cancer - HER2 (-)/KRAS (+)/MSI stable/MMR normal/BRAF -.   From my perspective, I really think that we need to just watch her.  I just do not think that treating her right now is going to help her or help her quality of life.  Her quality life is good right now.    She actually saw a GI oncologist at [facility].  Unfortunately, she was not that impressed.  I am not sure what they recommend.  My recommendation continues to be careful observation.  Since she has no measurable disease, there is no indication for therapy as we do not know if therapy is working.  We will now plan to get her back in 2 months.     Volanda Napoleon, MD 7/2/20218:52 AM

## 2019-10-11 ENCOUNTER — Other Ambulatory Visit: Payer: Self-pay | Admitting: Hematology & Oncology

## 2019-10-11 MED FILL — PANTOPRAZOLE SOD DR 40 MG T: 40 | 30 days supply | Qty: 30 | Fill #6

## 2019-10-27 MED FILL — NYSTATIN 100,000 UNITS/GM O: 100000 | 7 days supply | Qty: 15 | Fill #0

## 2019-11-18 ENCOUNTER — Other Ambulatory Visit: Payer: Self-pay

## 2019-11-18 ENCOUNTER — Inpatient Hospital Stay: Payer: BC Managed Care – PPO | Attending: Radiation Oncology

## 2019-11-18 DIAGNOSIS — Z452 Encounter for adjustment and management of vascular access device: Secondary | ICD-10-CM | POA: Diagnosis present

## 2019-11-18 DIAGNOSIS — C189 Malignant neoplasm of colon, unspecified: Secondary | ICD-10-CM | POA: Diagnosis present

## 2019-11-18 NOTE — Progress Notes (Signed)
Pt presents stating her end cap to her Port-a-cath became dislodged and came off her line. Pt then placed said cap back on line and clamped line prior to arrival. Port-a-cath needle removed and gauze dressing placed by this RN. Port a cath re-accessed without difficulty and good blood return noted. Pt was able to start her TPN infusion without difficulty. Pt stated her home health RN will be there next week to assess site. Pt aware to call clinic with any questions or concerns. Pt left clinic in no apparent distress.

## 2019-11-18 NOTE — Patient Instructions (Signed)

## 2019-11-25 ENCOUNTER — Other Ambulatory Visit: Payer: Self-pay

## 2019-11-25 ENCOUNTER — Inpatient Hospital Stay: Payer: BC Managed Care – PPO | Attending: Radiation Oncology | Admitting: Internal Medicine

## 2019-11-25 DIAGNOSIS — Z79899 Other long term (current) drug therapy: Secondary | ICD-10-CM | POA: Insufficient documentation

## 2019-11-25 DIAGNOSIS — Z923 Personal history of irradiation: Secondary | ICD-10-CM | POA: Insufficient documentation

## 2019-11-25 DIAGNOSIS — Z85038 Personal history of other malignant neoplasm of large intestine: Secondary | ICD-10-CM | POA: Insufficient documentation

## 2019-11-25 DIAGNOSIS — Z9221 Personal history of antineoplastic chemotherapy: Secondary | ICD-10-CM | POA: Insufficient documentation

## 2019-11-25 DIAGNOSIS — L988 Other specified disorders of the skin and subcutaneous tissue: Secondary | ICD-10-CM | POA: Insufficient documentation

## 2019-11-25 DIAGNOSIS — G893 Neoplasm related pain (acute) (chronic): Secondary | ICD-10-CM

## 2019-11-26 ENCOUNTER — Inpatient Hospital Stay: Payer: BC Managed Care – PPO

## 2019-11-26 ENCOUNTER — Encounter: Payer: Self-pay | Admitting: Hematology & Oncology

## 2019-11-26 ENCOUNTER — Inpatient Hospital Stay (HOSPITAL_BASED_OUTPATIENT_CLINIC_OR_DEPARTMENT_OTHER): Payer: BC Managed Care – PPO | Admitting: Hematology & Oncology

## 2019-11-26 ENCOUNTER — Other Ambulatory Visit: Payer: Self-pay

## 2019-11-26 VITALS — BP 103/56 | HR 90 | Temp 98.1°F | Resp 20 | Wt 160.0 lb

## 2019-11-26 DIAGNOSIS — Z79899 Other long term (current) drug therapy: Secondary | ICD-10-CM | POA: Diagnosis not present

## 2019-11-26 DIAGNOSIS — C189 Malignant neoplasm of colon, unspecified: Secondary | ICD-10-CM | POA: Diagnosis not present

## 2019-11-26 DIAGNOSIS — L988 Other specified disorders of the skin and subcutaneous tissue: Secondary | ICD-10-CM | POA: Diagnosis not present

## 2019-11-26 DIAGNOSIS — D62 Acute posthemorrhagic anemia: Secondary | ICD-10-CM

## 2019-11-26 DIAGNOSIS — Z9221 Personal history of antineoplastic chemotherapy: Secondary | ICD-10-CM | POA: Diagnosis not present

## 2019-11-26 DIAGNOSIS — Z85038 Personal history of other malignant neoplasm of large intestine: Secondary | ICD-10-CM | POA: Diagnosis present

## 2019-11-26 DIAGNOSIS — Z95828 Presence of other vascular implants and grafts: Secondary | ICD-10-CM

## 2019-11-26 DIAGNOSIS — Z923 Personal history of irradiation: Secondary | ICD-10-CM | POA: Diagnosis not present

## 2019-11-26 LAB — CBC WITH DIFFERENTIAL (CANCER CENTER ONLY)
Abs Immature Granulocytes: 0.03 10*3/uL (ref 0.00–0.07)
Basophils Absolute: 0.1 10*3/uL (ref 0.0–0.1)
Basophils Relative: 1 %
Eosinophils Absolute: 0.7 10*3/uL — ABNORMAL HIGH (ref 0.0–0.5)
Eosinophils Relative: 8 %
HCT: 35.5 % — ABNORMAL LOW (ref 36.0–46.0)
Hemoglobin: 11.6 g/dL — ABNORMAL LOW (ref 12.0–15.0)
Immature Granulocytes: 0 %
Lymphocytes Relative: 13 %
Lymphs Abs: 1.3 10*3/uL (ref 0.7–4.0)
MCH: 28.3 pg (ref 26.0–34.0)
MCHC: 32.7 g/dL (ref 30.0–36.0)
MCV: 86.6 fL (ref 80.0–100.0)
Monocytes Absolute: 0.7 10*3/uL (ref 0.1–1.0)
Monocytes Relative: 7 %
Neutro Abs: 7.1 10*3/uL (ref 1.7–7.7)
Neutrophils Relative %: 71 %
Platelet Count: 397 10*3/uL (ref 150–400)
RBC: 4.1 MIL/uL (ref 3.87–5.11)
RDW: 13.2 % (ref 11.5–15.5)
WBC Count: 9.9 10*3/uL (ref 4.0–10.5)
nRBC: 0 % (ref 0.0–0.2)

## 2019-11-26 LAB — CMP (CANCER CENTER ONLY)
ALT: 31 U/L (ref 0–44)
AST: 24 U/L (ref 15–41)
Albumin: 3.9 g/dL (ref 3.5–5.0)
Alkaline Phosphatase: 166 U/L — ABNORMAL HIGH (ref 38–126)
Anion gap: 9 (ref 5–15)
BUN: 28 mg/dL — ABNORMAL HIGH (ref 6–20)
CO2: 28 mmol/L (ref 22–32)
Calcium: 9.6 mg/dL (ref 8.9–10.3)
Chloride: 103 mmol/L (ref 98–111)
Creatinine: 0.85 mg/dL (ref 0.44–1.00)
GFR, Est AFR Am: >60 mL/min (ref >60)
GFR, Estimated: >60 mL/min (ref >60)
Glucose, Bld: 102 mg/dL — ABNORMAL HIGH (ref 70–99)
Potassium: 4 mmol/L (ref 3.5–5.1)
Sodium: 140 mmol/L (ref 135–145)
Total Bilirubin: 0.9 mg/dL (ref 0.3–1.2)
Total Protein: 7.4 g/dL (ref 6.5–8.1)

## 2019-11-26 LAB — IRON AND TIBC
Iron: 46 ug/dL (ref 41–142)
Saturation Ratios: 24 % (ref 21–57)
TIBC: 194 ug/dL — ABNORMAL LOW (ref 236–444)
UIBC: 148 ug/dL (ref 120–384)

## 2019-11-26 LAB — FERRITIN: Ferritin: 684 ng/mL — ABNORMAL HIGH (ref 11–307)

## 2019-11-26 LAB — LACTATE DEHYDROGENASE: LDH: 185 U/L (ref 98–192)

## 2019-11-26 MED ORDER — SODIUM CHLORIDE 0.9% FLUSH
10.0000 mL | INTRAVENOUS | Status: DC | PRN
  Administered 2019-11-26: 10 mL via INTRAVENOUS
  Filled 2019-11-26: qty 10

## 2019-11-26 MED ORDER — HEPARIN SOD (PORK) LOCK FLUSH 100 UNIT/ML IV SOLN
500.0000 [IU] | Freq: Once | INTRAVENOUS | Status: AC
  Administered 2019-11-26: 500 [IU] via INTRAVENOUS
  Filled 2019-11-26: qty 5

## 2019-11-26 NOTE — Progress Notes (Signed)
Hematology and Oncology Follow Up Visit  Carla Mills [id number] 24-Jan-1971 49 y.o. 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  She really looks fantastic.  Unfortunately, the one problem that she is having that she is getting really tired of having the TNA.  She really wants to stop this.  She is going to talk to her oncologic surgeon at [facility] in a couple weeks.  She really wants to have surgery to try to repair the fistula.  She says that the TNA is adversely affecting her quality of life.  It is weakening her spirit.  She has always been very tough.  She still has a lot of discharge from the fistula.  She really does not think that the fistula is closing itself up.  She has been on TNA for 6 months already.  Thankfully, she has had no problem with infection.  She does have a urostomy.  The urine is clear.  Thankfully, she still able to travel.  Unfortunately, she has to bring all the TNA paraphernalia with her.  I think that they are planning on going up to [place] to watch her daughter play soccer in college.  They were very very kind and brought me back a [employer] bat.  I was incredibly humbled and incredibly appreciative of the effort that they made for me.  She has had no leg swelling.  She has had no rashes.  Overall, her performance status is ECOG 1.   Allergies as of 11/26/2019       Reactions   Bactrim [sulfamethoxazole-trimethoprim] Swelling, Other (See Comments)   Severe generalized swelling   Capecitabine Hives, Other (See Comments)   Legs and arms, after completing therapy   Chlorhexidine Rash   Oxaprozin Hives   Day pro   Penicillins Hives, Other (See Comments)   Has patient had a PCN reaction causing immediate rash, facial/tongue/throat swelling, SOB or lightheadedness with hypotension: no Has patient had a PCN reaction causing severe rash involving mucus membranes or skin necrosis: {no Has patient had a PCN reaction that required hospitalization no Has patient had a PCN reaction occurring within the last 10 years: no If all of the above answers are "NO", then may proceed with Cephalosporin use.      Medication List       Accurate as of November 26, 2019  9:03 AM. If you have any questions, ask your nurse or doctor.        STOP taking these medications   ciprofloxacin 500 MG tablet Commonly known as: CIPRO Stopped by: Volanda Napoleon, MD   clindamycin 150 MG capsule Commonly known as: CLEOCIN Stopped by: Volanda Napoleon, MD   dexamethasone 4 MG tablet Commonly known as: DECADRON Stopped by: Volanda Napoleon, MD   enoxaparin 40 MG/0.4ML injection Commonly known as: LOVENOX Stopped by: Volanda Napoleon, MD     TAKE these medications   cetirizine 10  MG tablet Commonly known as: ZYRTEC Take 10 mg by mouth daily.   fluticasone 50 MCG/ACT nasal spray Commonly known as: FLONASE Place 1 spray into both nostrils 2 (two) times daily.   ibuprofen 200 MG tablet Commonly known as: ADVIL Take 600 mg by mouth 2 (two) times daily as needed.   LORazepam 1 MG tablet Commonly known as: ATIVAN Take 1 tablet (1 mg total) by mouth every 8 (eight) hours.   nystatin ointment Commonly known as: MYCOSTATIN Apply topically 2 (two) times daily.   ondansetron 8 MG tablet Commonly known as: Zofran Take 1 tablet (8 mg total) by mouth 2 (two) times daily as  needed. Start on day 3 after chemotherapy.   pantoprazole 40 MG tablet Commonly known as: PROTONIX TAKE 1 TABLET (40 MG TOTAL) BY MOUTH DAILY.   prochlorperazine 10 MG tablet Commonly known as: COMPAZINE Take 1 tablet (10 mg total) by mouth every 6 (six) hours as needed (Nausea or vomiting).   sodium chloride 0.45 % solution Inject into the vein.   sterile water SOLN with amino acids 10 % SOLN 1.3 g/kg, dextrose 70 % SOLN 20 % Inject into the vein.   Urinary Leg Bag Kit 1 Units by Misc.(Non-Drug; Combo Route) route once a week.   Bard Extension Tubing/Connect Misc 1 Units by [employer].(Non-Drug; Combo Route) route twice a week. Urinary leg bag extension tubing       Allergies:  Allergies  Allergen Reactions  . Bactrim [Sulfamethoxazole-Trimethoprim] Swelling and Other (See Comments)    Severe generalized swelling  . Capecitabine Hives and Other (See Comments)    Legs and arms, after completing therapy  . Chlorhexidine Rash  . Oxaprozin Hives    Day pro  . Penicillins Hives and Other (See Comments)    Has patient had a PCN reaction causing immediate rash, facial/tongue/throat swelling, SOB or lightheadedness with hypotension: no Has patient had a PCN reaction causing severe rash involving mucus membranes or skin necrosis: {no Has patient had a PCN reaction that required hospitalization no Has patient had a PCN reaction occurring within the last 10 years: no If all of the above answers are "NO", then may proceed with Cephalosporin use.    Past Medical History, Surgical history, Social history, and Family History were reviewed and updated.  Review of Systems: Review of Systems  Constitutional: Negative.   HENT: Negative.   Eyes: Negative.   Respiratory: Negative.   Cardiovascular: Negative.   Gastrointestinal: Negative.   Genitourinary: Negative.   Musculoskeletal: Negative.   Skin: Negative.   Neurological: Negative.   Endo/Heme/Allergies: Negative.     Psychiatric/Behavioral: Negative.       Physical Exam:  weight is 160 lb (72.6 kg). Her oral temperature is 98.1 F (36.7 C). Her blood pressure is 103/56 (abnormal) and her pulse is 90. Her respiration is 20 and oxygen saturation is 97%.   Wt Readings from Last 3 Encounters:  11/26/19 160 lb (72.6 kg)  09/24/19 163 lb (73.9 kg)  08/13/19 160 lb (72.6 kg)    74month 24 scenario 316 morning man Eric over 45 her vital signs show a temperature 97.6.  Pulse 76.  Blood pressure 114/75.  Physical Exam Vitals reviewed.  HENT:     Head: Normocephalic and atraumatic.  Eyes:     Pupils: Pupils are equal, round, and reactive to light.  Cardiovascular:     Rate and Rhythm: Normal rate and regular rhythm.     Heart sounds: Normal heart sounds.  Pulmonary:  Effort: Pulmonary effort is normal.     Breath sounds: Normal breath sounds.  Abdominal:     General: Bowel sounds are normal.     Palpations: Abdomen is soft.     Comments: Abdominal exam shows laparotomy scars that are well-healed.  She has a colostomy in the left lower quadrant.  There is a urostomy in the right lower quadrant.  She does have decent bowel sounds.  She has no fluid wave.  There is no guarding or rebound tenderness.  She has no palpable liver or spleen tip.  Musculoskeletal:        General: No tenderness or deformity. Normal range of motion.     Cervical back: Normal range of motion.  Lymphadenopathy:     Cervical: No cervical adenopathy.  Skin:    General: Skin is warm and dry.     Findings: No erythema or rash.  Neurological:     Mental Status: She is alert and oriented to person, place, and time.  Psychiatric:        Behavior: Behavior normal.        Thought Content: Thought content normal.        Judgment: Judgment normal.      Lab Results  Component Value Date   WBC 9.9 11/26/2019   HGB 11.6 (L) 11/26/2019   HCT 35.5 (L) 11/26/2019   MCV 86.6 11/26/2019   PLT 397 11/26/2019   Lab Results   Component Value Date   FERRITIN 552 (H) 09/24/2019   IRON 60 09/24/2019   TIBC 231 (L) 09/24/2019   UIBC 172 09/24/2019   IRONPCTSAT 26 09/24/2019   Lab Results  Component Value Date   RETICCTPCT 3.9 (H) 07/06/2019   RBC 4.10 11/26/2019   No results found for: KPAFRELGTCHN, LAMBDASER, KAPLAMBRATIO No results found for: IGGSERUM, IGA, IGMSERUM No results found for: Kathrynn Ducking, MSPIKE, SPEI   Chemistry      Component Value Date/Time   NA 140 11/26/2019 0810   NA 145 03/27/2017 1101   NA 141 07/30/2016 0750   K 4.0 11/26/2019 0810   K 4.5 03/27/2017 1101   K 4.2 07/30/2016 0750   CL 103 11/26/2019 0810   CL 103 03/27/2017 1101   CO2 28 11/26/2019 0810   CO2 28 03/27/2017 1101   CO2 26 07/30/2016 0750   BUN 28 (H) 11/26/2019 0810   BUN 12 03/27/2017 1101   BUN 12.1 07/30/2016 0750   CREATININE 0.85 11/26/2019 0810   CREATININE 0.9 03/27/2017 1101   CREATININE 0.7 07/30/2016 0750      Component Value Date/Time   CALCIUM 9.6 11/26/2019 0810   CALCIUM 9.6 03/27/2017 1101   CALCIUM 9.5 07/30/2016 0750   ALKPHOS 166 (H) 11/26/2019 0810   ALKPHOS 101 (H) 03/27/2017 1101   ALKPHOS 72 07/30/2016 0750   AST 24 11/26/2019 0810   AST 22 07/30/2016 0750   ALT 31 11/26/2019 0810   ALT 20 03/27/2017 1101   ALT 22 07/30/2016 0750   BILITOT 0.9 11/26/2019 0810   BILITOT 0.54 07/30/2016 0750       Impression and Plan: Ms. Mans is a very pleasant 49 yo caucasian female with recurrent colon cancer - HER2 (-)/KRAS (+)/MSI stable/MMR normal/BRAF -.   Hopefully, she will have surgery in September to try to reclose the fistula.  She just wants to eat again.  She now has had by eating she will have a much better quality of life.  She  is not interested in just existing and being on liquid nutrition through a vein.  She is going to stop this regardless, October.  She will not go back on TNA after any surgery.  I really do not see any  obvious evidence of recurrent disease.  I am sure that when she has surgery, the surgeon will look around.  We will likely plan to get her back after she has surgery.  She will let us know when this is scheduled.    Her albumin is quite good.  As such I think she should be in good shape to heal up from surgery.   Volanda Napoleon, MD 9/3/20219:03 AM

## 2019-11-29 ENCOUNTER — Telehealth: Payer: Self-pay | Admitting: Internal Medicine

## 2019-11-29 NOTE — Telephone Encounter (Signed)
Thursday November 25, 2019  This nurse practitioner Wadie Lessen NP reviewed medical records and  met with Carla Mills today  in the OP cancer center.  I  was introduced to Carla Mills several years ago in the outpatient radiation oncology clinic and recently reached out to me for support.  Carla Mills is a 49 year old female with recurrent colon cancer.  Original diagnosis was 5 years ago.  Dr Marin Olp is her oncologist.   She is status post radiation treatment and chemotherapy she has a colostomy and an ileostomy.    Most recently she  had extensive pelvic surgery at [facility]/Dr. Vernard Gambles.  She is highly functional.  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  I have had the pleasure of speaking with Carla Mills multiple times on the phone and today in person regarding the physical and emotional components/struggles of living with serious life limiting illness.  She has done tremendously well through all of her treatments and surgery in the past but today voices frustration and feeling disheartened with the ongoing need for TPN and inability to eat by mouth.  Therapeutic listening and emotional support.  Carla Mills speaks to the love of her family, husband and 3 daughters.  She is anticipating surgery again with Dr. Vernard Gambles on September 18th  at [facility].  She wants to be done with TPN and does not want to "ever consider it again in the future".   She hopes for continues quality of life and is hopeful for the surgery to give her this.  Encouraged her to call with questions and concerns and I will be happy to meet with [name] any time  Wadie Lessen NP # [phone]  Palliative Medicine

## 2020-01-12 MED FILL — ENOXAPARIN SODIUM 40 MG/0.4: 40 | 14 days supply | Qty: 6 | Fill #0

## 2020-01-12 MED FILL — LOPERAMIDE 2 MG CAPSULE: 2 | 30 days supply | Qty: 240 | Fill #0

## 2020-01-12 MED FILL — DIPHENOXYLATE-ATROPINE 2.5-: 2.5-0.025 | 25 days supply | Qty: 100 | Fill #0

## 2020-01-12 MED FILL — oxyCODONE HCL 5 MG TABS: 5 | 4 days supply | Qty: 42 | Fill #0

## 2020-01-14 ENCOUNTER — Other Ambulatory Visit: Payer: Self-pay

## 2020-01-14 DIAGNOSIS — C189 Malignant neoplasm of colon, unspecified: Secondary | ICD-10-CM

## 2020-01-17 ENCOUNTER — Encounter: Payer: Self-pay | Admitting: *Deleted

## 2020-01-17 ENCOUNTER — Inpatient Hospital Stay: Payer: BC Managed Care – PPO | Attending: Radiation Oncology | Admitting: Hematology & Oncology

## 2020-01-17 ENCOUNTER — Other Ambulatory Visit: Payer: Self-pay | Admitting: *Deleted

## 2020-01-17 ENCOUNTER — Inpatient Hospital Stay: Payer: BC Managed Care – PPO

## 2020-01-17 ENCOUNTER — Telehealth: Payer: Self-pay | Admitting: *Deleted

## 2020-01-17 ENCOUNTER — Encounter: Payer: Self-pay | Admitting: Hematology & Oncology

## 2020-01-17 ENCOUNTER — Other Ambulatory Visit: Payer: Self-pay

## 2020-01-17 ENCOUNTER — Other Ambulatory Visit: Payer: Self-pay | Admitting: Family

## 2020-01-17 VITALS — BP 104/47 | HR 139 | Resp 18 | Wt 141.0 lb

## 2020-01-17 VITALS — BP 104/47 | HR 139 | Resp 18 | Wt 141.1 lb

## 2020-01-17 DIAGNOSIS — Z9221 Personal history of antineoplastic chemotherapy: Secondary | ICD-10-CM | POA: Diagnosis not present

## 2020-01-17 DIAGNOSIS — G893 Neoplasm related pain (acute) (chronic): Secondary | ICD-10-CM

## 2020-01-17 DIAGNOSIS — D649 Anemia, unspecified: Secondary | ICD-10-CM

## 2020-01-17 DIAGNOSIS — D62 Acute posthemorrhagic anemia: Secondary | ICD-10-CM

## 2020-01-17 DIAGNOSIS — R110 Nausea: Secondary | ICD-10-CM

## 2020-01-17 DIAGNOSIS — C772 Secondary and unspecified malignant neoplasm of intra-abdominal lymph nodes: Secondary | ICD-10-CM

## 2020-01-17 DIAGNOSIS — C187 Malignant neoplasm of sigmoid colon: Secondary | ICD-10-CM

## 2020-01-17 DIAGNOSIS — C189 Malignant neoplasm of colon, unspecified: Secondary | ICD-10-CM

## 2020-01-17 DIAGNOSIS — D500 Iron deficiency anemia secondary to blood loss (chronic): Secondary | ICD-10-CM

## 2020-01-17 DIAGNOSIS — E611 Iron deficiency: Secondary | ICD-10-CM | POA: Diagnosis not present

## 2020-01-17 DIAGNOSIS — Z933 Colostomy status: Secondary | ICD-10-CM | POA: Insufficient documentation

## 2020-01-17 DIAGNOSIS — E860 Dehydration: Secondary | ICD-10-CM | POA: Diagnosis not present

## 2020-01-17 DIAGNOSIS — Z923 Personal history of irradiation: Secondary | ICD-10-CM | POA: Insufficient documentation

## 2020-01-17 DIAGNOSIS — Z66 Do not resuscitate: Secondary | ICD-10-CM | POA: Insufficient documentation

## 2020-01-17 LAB — CMP (CANCER CENTER ONLY)
ALT: 14 U/L (ref 0–44)
AST: 19 U/L (ref 15–41)
Albumin: 3.7 g/dL (ref 3.5–5.0)
Alkaline Phosphatase: 429 U/L — ABNORMAL HIGH (ref 38–126)
Anion gap: 19 — ABNORMAL HIGH (ref 5–15)
BUN: 34 mg/dL — ABNORMAL HIGH (ref 6–20)
CO2: 19 mmol/L — ABNORMAL LOW (ref 22–32)
Calcium: 10.1 mg/dL (ref 8.9–10.3)
Chloride: 91 mmol/L — ABNORMAL LOW (ref 98–111)
Creatinine: 2.44 mg/dL — ABNORMAL HIGH (ref 0.44–1.00)
GFR, Estimated: 24 mL/min — ABNORMAL LOW (ref >60)
Glucose, Bld: 140 mg/dL — ABNORMAL HIGH (ref 70–99)
Potassium: 3.4 mmol/L — ABNORMAL LOW (ref 3.5–5.1)
Sodium: 129 mmol/L — ABNORMAL LOW (ref 135–145)
Total Bilirubin: 0.7 mg/dL (ref 0.3–1.2)
Total Protein: 8.5 g/dL — ABNORMAL HIGH (ref 6.5–8.1)

## 2020-01-17 LAB — CBC WITH DIFFERENTIAL (CANCER CENTER ONLY)
Abs Immature Granulocytes: 0.1 10*3/uL — ABNORMAL HIGH (ref 0.00–0.07)
Basophils Absolute: 0.1 10*3/uL (ref 0.0–0.1)
Basophils Relative: 1 %
Eosinophils Absolute: 0.1 10*3/uL (ref 0.0–0.5)
Eosinophils Relative: 1 %
HCT: 31.7 % — ABNORMAL LOW (ref 36.0–46.0)
Hemoglobin: 10.5 g/dL — ABNORMAL LOW (ref 12.0–15.0)
Immature Granulocytes: 1 %
Lymphocytes Relative: 7 %
Lymphs Abs: 1.3 10*3/uL (ref 0.7–4.0)
MCH: 29.1 pg (ref 26.0–34.0)
MCHC: 33.1 g/dL (ref 30.0–36.0)
MCV: 87.8 fL (ref 80.0–100.0)
Monocytes Absolute: 1.6 10*3/uL — ABNORMAL HIGH (ref 0.1–1.0)
Monocytes Relative: 8 %
Neutro Abs: 16.5 10*3/uL — ABNORMAL HIGH (ref 1.7–7.7)
Neutrophils Relative %: 82 %
Platelet Count: 1014 10*3/uL (ref 150–400)
RBC: 3.61 MIL/uL — ABNORMAL LOW (ref 3.87–5.11)
RDW: 13.9 % (ref 11.5–15.5)
WBC Count: 19.6 10*3/uL — ABNORMAL HIGH (ref 4.0–10.5)
nRBC: 0 % (ref 0.0–0.2)

## 2020-01-17 LAB — SAMPLE TO BLOOD BANK

## 2020-01-17 LAB — FERRITIN: Ferritin: 1228 ng/mL — ABNORMAL HIGH (ref 11–307)

## 2020-01-17 LAB — PREPARE RBC (CROSSMATCH)

## 2020-01-17 LAB — CEA (IN HOUSE-CHCC): CEA (CHCC-In House): 2.36 ng/mL (ref 0.00–5.00)

## 2020-01-17 MED ORDER — ACETAMINOPHEN 325 MG PO TABS
650.0000 mg | ORAL_TABLET | Freq: Once | ORAL | Status: AC
  Administered 2020-01-17: 650 mg via ORAL

## 2020-01-17 MED ORDER — SODIUM CHLORIDE 0.9 % IV SOLN
Freq: Once | INTRAVENOUS | Status: AC
  Filled 2020-01-17: qty 250

## 2020-01-17 MED ORDER — LORAZEPAM 1 MG PO TABS: 1.0000 mg | ORAL_TABLET | ORAL | 0 refills | Status: AC | PRN

## 2020-01-17 MED ORDER — HEPARIN SOD (PORK) LOCK FLUSH 100 UNIT/ML IV SOLN
500.0000 [IU] | Freq: Every day | INTRAVENOUS | Status: AC | PRN
  Administered 2020-01-17: 500 [IU]
  Filled 2020-01-17: qty 5

## 2020-01-17 MED ORDER — ONDANSETRON HCL 4 MG/2ML IJ SOLN: 8.0000 mg | Freq: Once | INTRAMUSCULAR | Status: DC

## 2020-01-17 MED ORDER — ONDANSETRON HCL 4 MG/2ML IJ SOLN
8.0000 mg | Freq: Once | INTRAMUSCULAR | Status: AC
  Administered 2020-01-17: 8 mg via INTRAVENOUS

## 2020-01-17 MED ORDER — LORAZEPAM 2 MG/ML IJ SOLN
1.0000 mg | Freq: Once | INTRAMUSCULAR | Status: AC
  Administered 2020-01-17: 1 mg via INTRAVENOUS

## 2020-01-17 MED ORDER — ONDANSETRON HCL 4 MG/2ML IJ SOLN
INTRAMUSCULAR | Status: AC
  Filled 2020-01-17: qty 4

## 2020-01-17 MED ORDER — SODIUM CHLORIDE 0.9% IV SOLUTION
250.0000 mL | Freq: Once | INTRAVENOUS | Status: DC
  Filled 2020-01-17: qty 250

## 2020-01-17 MED ORDER — SODIUM CHLORIDE 0.9 % IV SOLN: 8.0000 mg | Freq: Once | INTRAVENOUS | Status: DC

## 2020-01-17 MED ORDER — SODIUM CHLORIDE 0.9% FLUSH
10.0000 mL | INTRAVENOUS | Status: AC | PRN
  Administered 2020-01-17: 10 mL
  Filled 2020-01-17: qty 10

## 2020-01-17 MED ORDER — LORAZEPAM 2 MG/ML IJ SOLN
INTRAMUSCULAR | Status: AC
  Filled 2020-01-17: qty 1

## 2020-01-17 MED ORDER — DIPHENHYDRAMINE HCL 25 MG PO CAPS
25.0000 mg | ORAL_CAPSULE | Freq: Once | ORAL | Status: AC
  Administered 2020-01-17: 25 mg via ORAL

## 2020-01-17 MED ORDER — ACETAMINOPHEN 325 MG PO TABS
ORAL_TABLET | ORAL | Status: AC
  Filled 2020-01-17: qty 2

## 2020-01-17 MED ORDER — DIPHENHYDRAMINE HCL 25 MG PO CAPS
ORAL_CAPSULE | ORAL | Status: AC
  Filled 2020-01-17: qty 1

## 2020-01-17 MED FILL — LORazepam 1 MG TABS: 1 | 17 days supply | Qty: 100 | Fill #0

## 2020-01-17 NOTE — Progress Notes (Signed)
Order received from Dr. Marin Olp for patient to get two units of PRBC's and to call [facility] for pt.  [facility] called per order of Dr. Marin Olp.

## 2020-01-17 NOTE — Telephone Encounter (Signed)
Dr. Marin Olp notified of platelet count-1014. No new orders received at this time.

## 2020-01-17 NOTE — Progress Notes (Signed)
Pt discharged in no apparent distress. Pt left ambulatory without assistance. Pt aware of discharge instructions and verbalized understanding and had no further questions.  

## 2020-01-17 NOTE — Patient Instructions (Signed)

## 2020-01-17 NOTE — Progress Notes (Signed)
Hematology and Oncology Follow Up Visit  Carla Mills [id number] 1970/12/28 49 y.o. 01/17/2020   Principle Diagnosis:  Recurrent colon cancer - HER2 (-)/KRAS mutant/MSI stable/MMR normal/BRAF (wt) -   Past Therapy: FOLFOXIRI - s/p cycle #12 -completed on 11/12/2016 Radiation therapy with Xeloda - completed on 06/06/2017 Status post exploratory laparotomy with HIPEC - 07/29/2016 FOLFOXIRI/Avastin - s/p cycle #8 (Avastin on hold)  Current Therapy:   Xeloda/Avastin - cycle #7 - started on 04/08/2018  ( Xeloda is 2500 po BID x 10 day) -- d/c on 09/29/2018 due to fistula FOLFOXIRI - s/p cycle #4 -- last chemo on 05/11/2019   Interim History:  Carla Mills is here today for follow-up.  She just does not look all that good now.  She has been through so much.  I really hate this for her.    She recently has surgery at [facility].  This was a fairly extensive surgery.  Unfortunately, there was much more cancer than any scan had shown.  She is quite dehydrated.  She looks quite pale.  Even though her hemoglobin is 10, I really think she needs to be transfused.  I am sure with IV fluids, her hemoglobin will drop significantly.  We really had a long talk.  Her husband was with her.  I really believe that we now have to focus on her quality of life and her comfort.  With that, I believe that we need to consider Hospice.  I think that this is now the time to get Hospice involved.  I know this is incredibly hard for Carla Mills.  I know she is done everything that she could possibly do.  She has been through every chemotherapy that we can think of.  She just is in no condition for any additional therapy.  She is not a candidate for any clinical trial.  She understands the situation well.  She just wants to have her quality of life.  She wants to be at home.  We talked about end-of-life issues.  I told her that I really do not think that she would make it through the  month of November.  She really not eating much.  She has drainage tubes in her.  I think she has fistulas.  She does not want to be kept alive on a machine.  I talked to her about being kept alive on machines.  As such, she is a DO NOT RESUSCITATE.  I told her that I thought that she probably would just get weaker.  She would not eat all that much.  I do think she would just go to sleep and just wake up in [place].  She has 3 daughters.  I know she has fought hard for them.  One is still in high school.  I know that she we really want to be with him.  I know she will miss their "milestones."  However, I know that she will be with her daughters in [place] and also in their heart.  Again, I just want to make sure that we are there to provide for her comfort, respect and dignity.  I know that Hospice will do this.  She is followed for several years.  She has taken treatment.  Her cancer, and the nature of her cancer is such that it would just keep coming back.  Currently, I would have to say her performance status is ECOG 3-4. Marland Kitchen   Allergies as of 01/17/2020  Reactions   Bactrim [sulfamethoxazole-trimethoprim] Swelling, Other (See Comments)   Severe generalized swelling   Capecitabine Hives, Other (See Comments)   Legs and arms, after completing therapy   Chlorhexidine Rash   Oxaprozin Hives   Day pro   Penicillins Hives, Other (See Comments)   Has patient had a PCN reaction causing immediate rash, facial/tongue/throat swelling, SOB or lightheadedness with hypotension: no Has patient had a PCN reaction causing severe rash involving mucus membranes or skin necrosis: {no Has patient had a PCN reaction that required hospitalization no Has patient had a PCN reaction occurring within the last 10 years: no If all of the above answers are "NO", then may proceed with Cephalosporin use.   Sulfamethoxazole Other (See Comments)   Other Rash      Medication List       Accurate as of January 17, 2020  4:27 PM. If you have any questions, ask your nurse or doctor.        acetaminophen 500 MG tablet Commonly known as: TYLENOL Take by mouth.   cetirizine 10 MG tablet Commonly known as: ZYRTEC Take 10 mg by mouth daily.   diphenoxylate-atropine 2.5-0.025 MG tablet Commonly known as: LOMOTIL Take by mouth.   diphenoxylate-atropine 2.5-0.025 MG tablet Commonly known as: LOMOTIL Take 1 tablet by mouth 4 (four) times daily as needed.   enoxaparin 40 MG/0.4ML injection Commonly known as: LOVENOX Inject 40 mg into the skin daily.   fluticasone 50 MCG/ACT nasal spray Commonly known as: FLONASE Place 1 spray into both nostrils 2 (two) times daily.   ibuprofen 200 MG tablet Commonly known as: ADVIL Take 600 mg by mouth 2 (two) times daily as needed.   loperamide 2 MG capsule Commonly known as: IMODIUM Take 4 mg by mouth 4 (four) times daily.   LORazepam 1 MG tablet Commonly known as: ATIVAN Take 1 tablet (1 mg total) by mouth every 4 (four) hours as needed for anxiety. What changed:   when to take this  reasons to take this Changed by: Volanda Napoleon, MD   moxifloxacin 400 MG tablet Commonly known as: AVELOX Take 400 mg by mouth daily.   nystatin ointment Commonly known as: MYCOSTATIN Apply topically 2 (two) times daily.   ondansetron 8 MG tablet Commonly known as: Zofran Take 1 tablet (8 mg total) by mouth 2 (two) times daily as needed. Start on day 3 after chemotherapy.   oxyCODONE 5 MG immediate release tablet Commonly known as: Oxy IR/ROXICODONE Take 5-10 mg by mouth every 4 (four) hours as needed.   pantoprazole 40 MG tablet Commonly known as: PROTONIX TAKE 1 TABLET (40 MG TOTAL) BY MOUTH DAILY.   prochlorperazine 10 MG tablet Commonly known as: COMPAZINE Take 1 tablet (10 mg total) by mouth every 6 (six) hours as needed (Nausea or vomiting).   sodium chloride 0.45 % solution Inject into the vein.   sterile water SOLN with amino acids 10 %  SOLN 1.3 g/kg, dextrose 70 % SOLN 20 % Inject into the vein.   Urinary Leg Bag Kit 1 Units by Misc.(Non-Drug; Combo Route) route once a week.   Bard Extension Tubing/Connect Misc 1 Units by [employer].(Non-Drug; Combo Route) route twice a week. Urinary leg bag extension tubing       Allergies:  Allergies  Allergen Reactions  . Bactrim [Sulfamethoxazole-Trimethoprim] Swelling and Other (See Comments)    Severe generalized swelling  . Capecitabine Hives and Other (See Comments)    Legs and arms, after completing therapy  .  Chlorhexidine Rash  . Oxaprozin Hives    Day pro  . Penicillins Hives and Other (See Comments)    Has patient had a PCN reaction causing immediate rash, facial/tongue/throat swelling, SOB or lightheadedness with hypotension: no Has patient had a PCN reaction causing severe rash involving mucus membranes or skin necrosis: {no Has patient had a PCN reaction that required hospitalization no Has patient had a PCN reaction occurring within the last 10 years: no If all of the above answers are "NO", then may proceed with Cephalosporin use.  . Sulfamethoxazole Other (See Comments)  . Other Rash    Past Medical History, Surgical history, Social history, and Family History were reviewed and updated.  Review of Systems: Review of Systems  Constitutional: Negative.   HENT: Negative.   Eyes: Negative.   Respiratory: Negative.   Cardiovascular: Negative.   Gastrointestinal: Negative.   Genitourinary: Negative.   Musculoskeletal: Negative.   Skin: Negative.   Neurological: Negative.   Endo/Heme/Allergies: Negative.   Psychiatric/Behavioral: Negative.       Physical Exam:  weight is 141 lb (64 kg). Her blood pressure is 104/47 (abnormal) and her pulse is 139 (abnormal). Her respiration is 18 and oxygen saturation is 100%.   Wt Readings from Last 3 Encounters:  01/17/20 141 lb (64 kg)  01/17/20 141 lb 1.9 oz (64 kg)  11/26/19 160 lb (72.6 kg)    76month 24  scenario 316 morning man Eric over 45 her vital signs show a temperature 97.6.  Pulse 76.  Blood pressure 114/75.  Physical Exam Vitals reviewed.  HENT:     Head: Normocephalic and atraumatic.  Eyes:     Pupils: Pupils are equal, round, and reactive to light.  Cardiovascular:     Rate and Rhythm: Normal rate and regular rhythm.     Heart sounds: Normal heart sounds.  Pulmonary:     Effort: Pulmonary effort is normal.     Breath sounds: Normal breath sounds.  Abdominal:     General: Bowel sounds are normal.     Palpations: Abdomen is soft.     Comments: Abdominal exam shows laparotomy scars that are well-healed.  She has a colostomy in the left lower quadrant.  There is a urostomy in the right lower quadrant.  She does have decent bowel sounds.  She has no fluid wave.  There is no guarding or rebound tenderness.  She has no palpable liver or spleen tip.  Musculoskeletal:        General: No tenderness or deformity. Normal range of motion.     Cervical back: Normal range of motion.  Lymphadenopathy:     Cervical: No cervical adenopathy.  Skin:    General: Skin is warm and dry.     Findings: No erythema or rash.  Neurological:     Mental Status: She is alert and oriented to person, place, and time.  Psychiatric:        Behavior: Behavior normal.        Thought Content: Thought content normal.        Judgment: Judgment normal.      Lab Results  Component Value Date   WBC 19.6 (H) 01/17/2020   HGB 10.5 (L) 01/17/2020   HCT 31.7 (L) 01/17/2020   MCV 87.8 01/17/2020   PLT 1,014 ([place] 01/17/2020   Lab Results  Component Value Date   FERRITIN 1,228 (H) 01/17/2020   IRON 46 11/26/2019   TIBC 194 (L) 11/26/2019   UIBC 148 11/26/2019  IRONPCTSAT 24 11/26/2019   Lab Results  Component Value Date   RETICCTPCT 3.9 (H) 07/06/2019   RBC 3.61 (L) 01/17/2020   No results found for: KPAFRELGTCHN, LAMBDASER, KAPLAMBRATIO No results found for: IGGSERUM, IGA, IGMSERUM No results  found for: Kathrynn Ducking, MSPIKE, SPEI   Chemistry      Component Value Date/Time   NA 129 (L) 01/17/2020 0825   NA 145 03/27/2017 1101   NA 141 07/30/2016 0750   K 3.4 (L) 01/17/2020 0825   K 4.5 03/27/2017 1101   K 4.2 07/30/2016 0750   CL 91 (L) 01/17/2020 0825   CL 103 03/27/2017 1101   CO2 19 (L) 01/17/2020 0825   CO2 28 03/27/2017 1101   CO2 26 07/30/2016 0750   BUN 34 (H) 01/17/2020 0825   BUN 12 03/27/2017 1101   BUN 12.1 07/30/2016 0750   CREATININE 2.44 (H) 01/17/2020 0825   CREATININE 0.9 03/27/2017 1101   CREATININE 0.7 07/30/2016 0750      Component Value Date/Time   CALCIUM 10.1 01/17/2020 0825   CALCIUM 9.6 03/27/2017 1101   CALCIUM 9.5 07/30/2016 0750   ALKPHOS 429 (H) 01/17/2020 0825   ALKPHOS 101 (H) 03/27/2017 1101   ALKPHOS 72 07/30/2016 0750   AST 19 01/17/2020 0825   AST 22 07/30/2016 0750   ALT 14 01/17/2020 0825   ALT 20 03/27/2017 1101   ALT 22 07/30/2016 0750   BILITOT 0.7 01/17/2020 0825   BILITOT 0.54 07/30/2016 0750       Impression and Plan: Ms. Crownover is a very pleasant 49 yo caucasian female with recurrent colon cancer - HER2 (-)/KRAS (+)/MSI stable/MMR normal/BRAF -.   I just wish that she would have done better with surgery.  However, I understand that surgery was only way to try to help her so she could eat more.  She is able to eat a little bit right now although she does get sick.  She does have some drainage.  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  I know that the blood transfusion will help her.  I know that she will get a second unit tomorrow.  As far as seeing her back, we will just have to see how everything goes with her tomorrow.  We may need to get her back weekly for right now.  I know that hospice will come out and be a big help.   Volanda Napoleon, MD 10/25/20214:27 PM

## 2020-01-17 NOTE — Patient Instructions (Signed)

## 2020-01-18 ENCOUNTER — Other Ambulatory Visit: Payer: Self-pay | Admitting: Hematology & Oncology

## 2020-01-18 ENCOUNTER — Inpatient Hospital Stay: Payer: BC Managed Care – PPO

## 2020-01-18 ENCOUNTER — Other Ambulatory Visit: Payer: Self-pay | Admitting: Family

## 2020-01-18 VITALS — BP 91/55 | HR 119 | Temp 99.6°F | Resp 17

## 2020-01-18 DIAGNOSIS — C189 Malignant neoplasm of colon, unspecified: Secondary | ICD-10-CM | POA: Diagnosis not present

## 2020-01-18 DIAGNOSIS — D62 Acute posthemorrhagic anemia: Secondary | ICD-10-CM

## 2020-01-18 DIAGNOSIS — D500 Iron deficiency anemia secondary to blood loss (chronic): Secondary | ICD-10-CM

## 2020-01-18 LAB — IRON AND TIBC
Iron: 18 ug/dL — ABNORMAL LOW (ref 41–142)
Saturation Ratios: 9 % — ABNORMAL LOW (ref 21–57)
TIBC: 204 ug/dL — ABNORMAL LOW (ref 236–444)
UIBC: 185 ug/dL (ref 120–384)

## 2020-01-18 MED ORDER — LORAZEPAM 2 MG/ML IJ SOLN
1.0000 mg | Freq: Once | INTRAMUSCULAR | Status: AC
  Administered 2020-01-18: 1 mg via INTRAVENOUS

## 2020-01-18 MED ORDER — SODIUM CHLORIDE 0.9% IV SOLUTION
250.0000 mL | Freq: Once | INTRAVENOUS | Status: DC
  Filled 2020-01-18: qty 250

## 2020-01-18 MED ORDER — DIPHENHYDRAMINE HCL 25 MG PO CAPS
ORAL_CAPSULE | ORAL | Status: AC
  Filled 2020-01-18: qty 1

## 2020-01-18 MED ORDER — DIPHENHYDRAMINE HCL 25 MG PO CAPS
25.0000 mg | ORAL_CAPSULE | Freq: Once | ORAL | Status: AC
  Administered 2020-01-18: 25 mg via ORAL

## 2020-01-18 MED ORDER — SODIUM CHLORIDE 0.9 % IV SOLN
Freq: Once | INTRAVENOUS | Status: AC
  Filled 2020-01-18: qty 250

## 2020-01-18 MED ORDER — ONDANSETRON HCL 4 MG/2ML IJ SOLN
INTRAMUSCULAR | Status: AC
  Filled 2020-01-18: qty 4

## 2020-01-18 MED ORDER — ONDANSETRON HCL 4 MG/2ML IJ SOLN
8.0000 mg | Freq: Once | INTRAMUSCULAR | Status: AC
  Administered 2020-01-18: 8 mg via INTRAVENOUS

## 2020-01-18 MED ORDER — LORAZEPAM 2 MG/ML IJ SOLN
INTRAMUSCULAR | Status: AC
  Filled 2020-01-18: qty 1

## 2020-01-18 MED ORDER — ACETAMINOPHEN 325 MG PO TABS
ORAL_TABLET | ORAL | Status: AC
  Filled 2020-01-18: qty 2

## 2020-01-18 MED ORDER — DIPHENHYDRAMINE HCL 25 MG PO CAPS: 25.0000 mg | ORAL_CAPSULE | Freq: Once | ORAL | Status: DC

## 2020-01-18 MED ORDER — ACETAMINOPHEN 325 MG PO TABS
650.0000 mg | ORAL_TABLET | Freq: Once | ORAL | Status: AC
  Administered 2020-01-18: 650 mg via ORAL

## 2020-01-18 MED ORDER — SODIUM CHLORIDE 0.9% FLUSH
10.0000 mL | Freq: Once | INTRAVENOUS | Status: AC | PRN
  Administered 2020-01-18: 10 mL
  Filled 2020-01-18: qty 10

## 2020-01-18 MED ORDER — HEPARIN SOD (PORK) LOCK FLUSH 100 UNIT/ML IV SOLN
500.0000 [IU] | Freq: Once | INTRAVENOUS | Status: AC | PRN
  Administered 2020-01-18: 500 [IU]
  Filled 2020-01-18: qty 5

## 2020-01-18 NOTE — Patient Instructions (Signed)

## 2020-01-19 ENCOUNTER — Encounter: Payer: Self-pay | Admitting: *Deleted

## 2020-01-19 LAB — TYPE AND SCREEN
ABO/RH(D): O POS
Antibody Screen: NEGATIVE
Unit division: 0
Unit division: 0

## 2020-01-19 LAB — BPAM RBC
Blood Product Expiration Date: 202111242359
Blood Product Expiration Date: 202111242359
ISSUE DATE / TIME: 202110251153
ISSUE DATE / TIME: 202110260745
Unit Type and Rh: 5100
Unit Type and Rh: 5100

## 2020-01-20 ENCOUNTER — Other Ambulatory Visit: Payer: Self-pay | Admitting: Hematology & Oncology

## 2020-01-20 ENCOUNTER — Inpatient Hospital Stay: Payer: BC Managed Care – PPO

## 2020-01-20 ENCOUNTER — Other Ambulatory Visit: Payer: Self-pay

## 2020-01-20 VITALS — BP 84/57 | HR 96 | Temp 98.2°F | Resp 19

## 2020-01-20 DIAGNOSIS — C189 Malignant neoplasm of colon, unspecified: Secondary | ICD-10-CM | POA: Diagnosis not present

## 2020-01-20 DIAGNOSIS — D500 Iron deficiency anemia secondary to blood loss (chronic): Secondary | ICD-10-CM

## 2020-01-20 DIAGNOSIS — D62 Acute posthemorrhagic anemia: Secondary | ICD-10-CM

## 2020-01-20 MED ORDER — SODIUM CHLORIDE 0.9 % IV SOLN: 1500.0000 mg | Freq: Once | INTRAVENOUS | Status: DC

## 2020-01-20 MED ORDER — SODIUM CHLORIDE 0.9 % IV SOLN
Freq: Once | INTRAVENOUS | Status: AC
  Filled 2020-01-20: qty 250

## 2020-01-20 MED ORDER — HEPARIN SOD (PORK) LOCK FLUSH 100 UNIT/ML IV SOLN
500.0000 [IU] | Freq: Once | INTRAVENOUS | Status: AC | PRN
  Administered 2020-01-20: 500 [IU]
  Filled 2020-01-20: qty 5

## 2020-01-20 MED ORDER — LORAZEPAM 2 MG/ML IJ SOLN
1.0000 mg | Freq: Once | INTRAMUSCULAR | Status: AC
  Administered 2020-01-20: 1 mg via INTRAVENOUS

## 2020-01-20 MED ORDER — OLANZAPINE 10 MG PO TABS: 10.0000 mg | ORAL_TABLET | Freq: Every day | ORAL | 2 refills | Status: AC

## 2020-01-20 MED ORDER — SODIUM CHLORIDE 0.9 % IV SOLN
Freq: Once | INTRAVENOUS | Status: DC
  Filled 2020-01-20: qty 250

## 2020-01-20 MED ORDER — ONDANSETRON HCL 4 MG/2ML IJ SOLN
INTRAMUSCULAR | Status: AC
  Filled 2020-01-20: qty 4

## 2020-01-20 MED ORDER — ONDANSETRON HCL 4 MG/2ML IJ SOLN
8.0000 mg | Freq: Once | INTRAMUSCULAR | Status: AC
  Administered 2020-01-20: 8 mg via INTRAVENOUS

## 2020-01-20 MED ORDER — SODIUM CHLORIDE 0.9% FLUSH
10.0000 mL | Freq: Once | INTRAVENOUS | Status: AC | PRN
  Administered 2020-01-20: 10 mL
  Filled 2020-01-20: qty 10

## 2020-01-20 MED ORDER — SODIUM CHLORIDE 0.9 % IV SOLN
200.0000 mg | Freq: Once | INTRAVENOUS | Status: AC
  Administered 2020-01-20: 200 mg via INTRAVENOUS
  Filled 2020-01-20: qty 200

## 2020-01-20 MED ORDER — LORAZEPAM 2 MG/ML IJ SOLN
INTRAMUSCULAR | Status: AC
  Filled 2020-01-20: qty 1

## 2020-01-20 MED FILL — OLANZapine 10 MG TABS: 10 | 30 days supply | Qty: 30 | Fill #0

## 2020-01-20 NOTE — Progress Notes (Signed)
Pt discharged in no apparent distress. Pt left via wheelchair.  Pt aware of discharge instructions and verbalized understanding and had no further questions.  

## 2020-01-20 NOTE — Patient Instructions (Signed)

## 2020-01-21 ENCOUNTER — Other Ambulatory Visit: Payer: Self-pay | Admitting: Hematology & Oncology

## 2020-01-21 NOTE — Progress Notes (Signed)
The following Medication: Venofer is approved for drug replacement program by [employer]. The enrollment period is from 01/21/2020 to 04/20/2020.  Reason for Assistance: [employer] termed 01/20/20 due to [place] does not cover. ID: [id number] First DOS:01/20/2020.

## 2020-01-24 MED FILL — MORPHINE SULFATE IR 15 MG T: 15 | 20 days supply | Qty: 60 | Fill #0

## 2020-01-24 MED FILL — PROMETHAZINE 25 MG TABLET: 25 | 15 days supply | Qty: 60 | Fill #0

## 2020-01-27 ENCOUNTER — Inpatient Hospital Stay: Payer: BC Managed Care – PPO

## 2020-01-27 ENCOUNTER — Inpatient Hospital Stay: Payer: BC Managed Care – PPO | Admitting: Hematology & Oncology

## 2020-01-28 MED FILL — MORPHINE SULF 100 MG/5 ML S: 100 | 20 days supply | Qty: 30 | Fill #0

## 2020-02-23 DEATH — deceased

## 2021-05-08 NOTE — Telephone Encounter (Signed)
Patient status deceased.  No further documentation needed.   °
